# Patient Record
Sex: Female | Born: 1940 | Race: Black or African American | Hispanic: No | Smoking: Former smoker
Health system: Southern US, Community
[De-identification: ages and names within clinical notes are randomized; demographics above are authoritative.]

## PROBLEM LIST (undated history)

## (undated) DIAGNOSIS — Z8042 Family history of malignant neoplasm of prostate: Secondary | ICD-10-CM

## (undated) DIAGNOSIS — Z950 Presence of cardiac pacemaker: Secondary | ICD-10-CM

## (undated) DIAGNOSIS — R0600 Dyspnea, unspecified: Secondary | ICD-10-CM

## (undated) DIAGNOSIS — Z803 Family history of malignant neoplasm of breast: Secondary | ICD-10-CM

## (undated) DIAGNOSIS — I471 Supraventricular tachycardia: Secondary | ICD-10-CM

## (undated) DIAGNOSIS — K219 Gastro-esophageal reflux disease without esophagitis: Secondary | ICD-10-CM

## (undated) DIAGNOSIS — D649 Anemia, unspecified: Secondary | ICD-10-CM

## (undated) DIAGNOSIS — J189 Pneumonia, unspecified organism: Secondary | ICD-10-CM

## (undated) DIAGNOSIS — A1811 Tuberculosis of kidney and ureter: Secondary | ICD-10-CM

## (undated) DIAGNOSIS — I509 Heart failure, unspecified: Secondary | ICD-10-CM

## (undated) DIAGNOSIS — J439 Emphysema, unspecified: Secondary | ICD-10-CM

## (undated) DIAGNOSIS — I10 Essential (primary) hypertension: Secondary | ICD-10-CM

## (undated) DIAGNOSIS — E785 Hyperlipidemia, unspecified: Secondary | ICD-10-CM

## (undated) DIAGNOSIS — M1990 Unspecified osteoarthritis, unspecified site: Secondary | ICD-10-CM

## (undated) HISTORY — DX: Family history of malignant neoplasm of breast: Z80.3

## (undated) HISTORY — DX: Tuberculosis of kidney and ureter: A18.11

## (undated) HISTORY — DX: Hyperlipidemia, unspecified: E78.5

## (undated) HISTORY — DX: Supraventricular tachycardia: I47.1

## (undated) HISTORY — DX: Emphysema, unspecified: J43.9

## (undated) HISTORY — DX: Heart failure, unspecified: I50.9

## (undated) HISTORY — PX: HEMORRHOID SURGERY: SHX153

## (undated) HISTORY — PX: ABDOMINAL HYSTERECTOMY: SHX81

## (undated) HISTORY — PX: TONSILLECTOMY: SUR1361

## (undated) HISTORY — PX: OTHER SURGICAL HISTORY: SHX169

## (undated) HISTORY — DX: Family history of malignant neoplasm of prostate: Z80.42

---

## 1959-04-08 DIAGNOSIS — A1811 Tuberculosis of kidney and ureter: Secondary | ICD-10-CM

## 1959-04-08 HISTORY — DX: Tuberculosis of kidney and ureter: A18.11

## 1999-12-28 ENCOUNTER — Encounter: Admission: RE | Admit: 1999-12-28 | Discharge: 1999-12-28 | Payer: Self-pay | Admitting: Cardiology

## 1999-12-28 ENCOUNTER — Encounter: Payer: Self-pay | Admitting: Cardiology

## 2000-06-28 ENCOUNTER — Encounter: Payer: Self-pay | Admitting: Emergency Medicine

## 2000-06-28 ENCOUNTER — Emergency Department (HOSPITAL_COMMUNITY): Admission: EM | Admit: 2000-06-28 | Discharge: 2000-06-28 | Payer: Self-pay | Admitting: Emergency Medicine

## 2000-06-30 ENCOUNTER — Emergency Department (HOSPITAL_COMMUNITY): Admission: EM | Admit: 2000-06-30 | Discharge: 2000-06-30 | Payer: Self-pay | Admitting: Emergency Medicine

## 2003-12-16 ENCOUNTER — Encounter: Admission: RE | Admit: 2003-12-16 | Discharge: 2003-12-16 | Payer: Self-pay | Admitting: Cardiology

## 2003-12-17 ENCOUNTER — Inpatient Hospital Stay (HOSPITAL_COMMUNITY): Admission: AD | Admit: 2003-12-17 | Discharge: 2003-12-22 | Payer: Self-pay | Admitting: Cardiology

## 2003-12-17 ENCOUNTER — Encounter: Payer: Self-pay | Admitting: Cardiovascular Disease

## 2004-06-10 ENCOUNTER — Emergency Department (HOSPITAL_COMMUNITY): Admission: EM | Admit: 2004-06-10 | Discharge: 2004-06-10 | Payer: Self-pay | Admitting: Emergency Medicine

## 2004-08-15 ENCOUNTER — Ambulatory Visit: Payer: Self-pay | Admitting: Internal Medicine

## 2004-08-16 ENCOUNTER — Ambulatory Visit: Payer: Self-pay | Admitting: Internal Medicine

## 2004-08-19 ENCOUNTER — Ambulatory Visit: Payer: Self-pay | Admitting: Internal Medicine

## 2004-08-23 ENCOUNTER — Ambulatory Visit: Payer: Self-pay | Admitting: Internal Medicine

## 2004-08-30 ENCOUNTER — Ambulatory Visit: Payer: Self-pay | Admitting: Internal Medicine

## 2004-09-06 ENCOUNTER — Ambulatory Visit: Payer: Self-pay | Admitting: Internal Medicine

## 2004-09-09 ENCOUNTER — Ambulatory Visit: Payer: Self-pay | Admitting: Internal Medicine

## 2004-09-13 ENCOUNTER — Ambulatory Visit: Payer: Self-pay | Admitting: Internal Medicine

## 2004-09-20 ENCOUNTER — Ambulatory Visit: Payer: Self-pay | Admitting: Internal Medicine

## 2004-09-23 ENCOUNTER — Ambulatory Visit: Payer: Self-pay | Admitting: Internal Medicine

## 2004-09-30 ENCOUNTER — Ambulatory Visit: Payer: Self-pay | Admitting: Internal Medicine

## 2004-10-04 ENCOUNTER — Ambulatory Visit: Payer: Self-pay | Admitting: Internal Medicine

## 2004-10-13 ENCOUNTER — Ambulatory Visit: Payer: Self-pay | Admitting: Internal Medicine

## 2004-10-17 ENCOUNTER — Ambulatory Visit: Payer: Self-pay | Admitting: Internal Medicine

## 2004-10-24 ENCOUNTER — Ambulatory Visit: Payer: Self-pay | Admitting: Internal Medicine

## 2004-10-31 ENCOUNTER — Ambulatory Visit: Payer: Self-pay | Admitting: Internal Medicine

## 2004-11-08 ENCOUNTER — Ambulatory Visit: Payer: Self-pay | Admitting: Internal Medicine

## 2004-11-14 ENCOUNTER — Ambulatory Visit: Payer: Self-pay | Admitting: Internal Medicine

## 2004-11-22 ENCOUNTER — Ambulatory Visit: Payer: Self-pay | Admitting: Internal Medicine

## 2004-12-02 ENCOUNTER — Ambulatory Visit: Payer: Self-pay | Admitting: Internal Medicine

## 2004-12-30 ENCOUNTER — Ambulatory Visit: Payer: Self-pay | Admitting: Internal Medicine

## 2005-01-10 ENCOUNTER — Ambulatory Visit: Payer: Self-pay | Admitting: Internal Medicine

## 2005-01-20 ENCOUNTER — Ambulatory Visit: Payer: Self-pay | Admitting: Internal Medicine

## 2005-02-20 ENCOUNTER — Ambulatory Visit: Payer: Self-pay | Admitting: Internal Medicine

## 2005-03-24 ENCOUNTER — Ambulatory Visit: Payer: Self-pay | Admitting: Internal Medicine

## 2005-04-28 ENCOUNTER — Ambulatory Visit: Payer: Self-pay | Admitting: Internal Medicine

## 2005-05-26 ENCOUNTER — Ambulatory Visit: Payer: Self-pay | Admitting: Internal Medicine

## 2005-06-02 ENCOUNTER — Ambulatory Visit: Payer: Self-pay | Admitting: Internal Medicine

## 2005-06-06 ENCOUNTER — Ambulatory Visit: Payer: Self-pay | Admitting: Internal Medicine

## 2005-06-07 ENCOUNTER — Ambulatory Visit: Payer: Self-pay | Admitting: Internal Medicine

## 2005-06-20 ENCOUNTER — Ambulatory Visit: Payer: Self-pay | Admitting: Internal Medicine

## 2005-06-27 ENCOUNTER — Ambulatory Visit: Payer: Self-pay | Admitting: Internal Medicine

## 2005-07-04 ENCOUNTER — Ambulatory Visit: Payer: Self-pay | Admitting: Internal Medicine

## 2005-08-04 ENCOUNTER — Ambulatory Visit: Payer: Self-pay | Admitting: Internal Medicine

## 2005-09-28 ENCOUNTER — Ambulatory Visit: Payer: Self-pay | Admitting: Internal Medicine

## 2005-10-04 ENCOUNTER — Ambulatory Visit: Payer: Self-pay | Admitting: Internal Medicine

## 2005-10-16 ENCOUNTER — Ambulatory Visit: Payer: Self-pay | Admitting: Internal Medicine

## 2005-10-23 ENCOUNTER — Ambulatory Visit: Payer: Self-pay | Admitting: Internal Medicine

## 2005-11-06 ENCOUNTER — Ambulatory Visit: Payer: Self-pay | Admitting: Internal Medicine

## 2005-11-28 ENCOUNTER — Ambulatory Visit: Payer: Self-pay | Admitting: Internal Medicine

## 2005-12-08 ENCOUNTER — Ambulatory Visit: Payer: Self-pay | Admitting: Internal Medicine

## 2005-12-12 ENCOUNTER — Ambulatory Visit: Payer: Self-pay | Admitting: Internal Medicine

## 2005-12-18 ENCOUNTER — Ambulatory Visit: Payer: Self-pay | Admitting: Internal Medicine

## 2005-12-26 ENCOUNTER — Ambulatory Visit: Payer: Self-pay | Admitting: Internal Medicine

## 2005-12-29 ENCOUNTER — Ambulatory Visit: Payer: Self-pay | Admitting: Internal Medicine

## 2006-01-04 ENCOUNTER — Ambulatory Visit: Payer: Self-pay | Admitting: Internal Medicine

## 2006-01-10 ENCOUNTER — Ambulatory Visit: Payer: Self-pay | Admitting: Internal Medicine

## 2006-01-16 ENCOUNTER — Ambulatory Visit: Payer: Self-pay | Admitting: Internal Medicine

## 2006-01-29 ENCOUNTER — Ambulatory Visit: Payer: Self-pay | Admitting: Internal Medicine

## 2006-01-31 ENCOUNTER — Ambulatory Visit: Payer: Self-pay | Admitting: Internal Medicine

## 2006-02-09 ENCOUNTER — Ambulatory Visit: Payer: Self-pay | Admitting: Internal Medicine

## 2006-02-13 ENCOUNTER — Ambulatory Visit: Payer: Self-pay | Admitting: Internal Medicine

## 2006-02-23 ENCOUNTER — Ambulatory Visit: Payer: Self-pay | Admitting: Internal Medicine

## 2006-03-06 ENCOUNTER — Ambulatory Visit: Payer: Self-pay | Admitting: Internal Medicine

## 2006-03-30 ENCOUNTER — Ambulatory Visit: Payer: Self-pay | Admitting: Internal Medicine

## 2006-04-03 ENCOUNTER — Ambulatory Visit: Payer: Self-pay | Admitting: Internal Medicine

## 2006-04-13 ENCOUNTER — Ambulatory Visit: Payer: Self-pay | Admitting: Internal Medicine

## 2006-04-26 ENCOUNTER — Ambulatory Visit: Payer: Self-pay | Admitting: Internal Medicine

## 2006-05-08 ENCOUNTER — Ambulatory Visit: Payer: Self-pay | Admitting: Internal Medicine

## 2006-05-22 ENCOUNTER — Ambulatory Visit: Payer: Self-pay | Admitting: Internal Medicine

## 2006-05-29 ENCOUNTER — Ambulatory Visit: Payer: Self-pay | Admitting: Internal Medicine

## 2006-06-05 ENCOUNTER — Ambulatory Visit: Payer: Self-pay | Admitting: Internal Medicine

## 2006-06-06 ENCOUNTER — Ambulatory Visit: Payer: Self-pay | Admitting: Internal Medicine

## 2006-06-14 ENCOUNTER — Ambulatory Visit: Payer: Self-pay | Admitting: Internal Medicine

## 2006-06-19 ENCOUNTER — Ambulatory Visit: Payer: Self-pay | Admitting: Internal Medicine

## 2006-07-03 ENCOUNTER — Ambulatory Visit: Payer: Self-pay | Admitting: Internal Medicine

## 2006-07-09 ENCOUNTER — Ambulatory Visit: Payer: Self-pay | Admitting: Internal Medicine

## 2006-07-18 ENCOUNTER — Encounter: Admission: RE | Admit: 2006-07-18 | Discharge: 2006-07-18 | Payer: Self-pay | Admitting: Cardiology

## 2006-07-19 ENCOUNTER — Ambulatory Visit: Payer: Self-pay | Admitting: Internal Medicine

## 2006-08-03 ENCOUNTER — Ambulatory Visit: Payer: Self-pay | Admitting: Internal Medicine

## 2006-08-07 DIAGNOSIS — I471 Supraventricular tachycardia: Secondary | ICD-10-CM

## 2006-08-07 DIAGNOSIS — I4719 Other supraventricular tachycardia: Secondary | ICD-10-CM

## 2006-08-07 HISTORY — DX: Supraventricular tachycardia: I47.1

## 2006-08-07 HISTORY — DX: Other supraventricular tachycardia: I47.19

## 2006-08-14 ENCOUNTER — Ambulatory Visit: Payer: Self-pay | Admitting: Internal Medicine

## 2006-08-21 ENCOUNTER — Ambulatory Visit: Payer: Self-pay | Admitting: Internal Medicine

## 2006-08-30 ENCOUNTER — Ambulatory Visit: Payer: Self-pay | Admitting: Internal Medicine

## 2006-09-04 ENCOUNTER — Ambulatory Visit: Payer: Self-pay | Admitting: Internal Medicine

## 2006-09-13 ENCOUNTER — Ambulatory Visit: Payer: Self-pay | Admitting: Internal Medicine

## 2006-09-18 ENCOUNTER — Ambulatory Visit: Payer: Self-pay | Admitting: Internal Medicine

## 2006-09-25 ENCOUNTER — Ambulatory Visit: Payer: Self-pay | Admitting: Internal Medicine

## 2006-10-04 ENCOUNTER — Ambulatory Visit: Payer: Self-pay | Admitting: Internal Medicine

## 2006-10-09 ENCOUNTER — Ambulatory Visit: Payer: Self-pay | Admitting: Internal Medicine

## 2006-10-16 ENCOUNTER — Ambulatory Visit: Payer: Self-pay | Admitting: Internal Medicine

## 2006-10-23 ENCOUNTER — Ambulatory Visit: Payer: Self-pay | Admitting: Internal Medicine

## 2006-10-25 ENCOUNTER — Ambulatory Visit: Payer: Self-pay | Admitting: Internal Medicine

## 2006-11-16 ENCOUNTER — Ambulatory Visit: Payer: Self-pay | Admitting: Internal Medicine

## 2006-11-30 ENCOUNTER — Ambulatory Visit: Payer: Self-pay | Admitting: Internal Medicine

## 2006-12-03 ENCOUNTER — Ambulatory Visit: Payer: Self-pay | Admitting: Internal Medicine

## 2006-12-07 ENCOUNTER — Ambulatory Visit: Payer: Self-pay | Admitting: Internal Medicine

## 2006-12-19 ENCOUNTER — Ambulatory Visit: Payer: Self-pay | Admitting: Internal Medicine

## 2006-12-27 ENCOUNTER — Observation Stay (HOSPITAL_COMMUNITY): Admission: EM | Admit: 2006-12-27 | Discharge: 2006-12-28 | Payer: Self-pay | Admitting: Emergency Medicine

## 2006-12-27 ENCOUNTER — Ambulatory Visit: Payer: Self-pay | Admitting: Internal Medicine

## 2007-01-09 ENCOUNTER — Ambulatory Visit: Payer: Self-pay | Admitting: Internal Medicine

## 2007-01-18 ENCOUNTER — Ambulatory Visit: Payer: Self-pay | Admitting: Internal Medicine

## 2007-01-25 ENCOUNTER — Ambulatory Visit: Payer: Self-pay | Admitting: Internal Medicine

## 2007-02-01 ENCOUNTER — Ambulatory Visit: Payer: Self-pay | Admitting: Internal Medicine

## 2007-02-14 ENCOUNTER — Ambulatory Visit: Payer: Self-pay | Admitting: Internal Medicine

## 2007-02-21 ENCOUNTER — Ambulatory Visit: Payer: Self-pay | Admitting: Internal Medicine

## 2007-03-05 ENCOUNTER — Ambulatory Visit: Payer: Self-pay | Admitting: Internal Medicine

## 2007-03-22 ENCOUNTER — Ambulatory Visit: Payer: Self-pay | Admitting: Internal Medicine

## 2007-03-29 ENCOUNTER — Ambulatory Visit: Payer: Self-pay | Admitting: Internal Medicine

## 2007-04-22 ENCOUNTER — Ambulatory Visit: Payer: Self-pay | Admitting: Internal Medicine

## 2007-05-03 ENCOUNTER — Ambulatory Visit: Payer: Self-pay | Admitting: Internal Medicine

## 2007-05-06 ENCOUNTER — Ambulatory Visit: Payer: Self-pay | Admitting: Internal Medicine

## 2007-05-24 ENCOUNTER — Ambulatory Visit: Payer: Self-pay | Admitting: Internal Medicine

## 2007-06-01 DIAGNOSIS — I471 Supraventricular tachycardia, unspecified: Secondary | ICD-10-CM | POA: Insufficient documentation

## 2007-06-01 DIAGNOSIS — J309 Allergic rhinitis, unspecified: Secondary | ICD-10-CM

## 2007-06-01 DIAGNOSIS — J4530 Mild persistent asthma, uncomplicated: Secondary | ICD-10-CM | POA: Insufficient documentation

## 2007-06-01 DIAGNOSIS — A1811 Tuberculosis of kidney and ureter: Secondary | ICD-10-CM | POA: Insufficient documentation

## 2007-06-05 ENCOUNTER — Ambulatory Visit: Payer: Self-pay | Admitting: Internal Medicine

## 2007-07-19 ENCOUNTER — Ambulatory Visit: Payer: Self-pay | Admitting: Internal Medicine

## 2007-08-16 ENCOUNTER — Ambulatory Visit: Payer: Self-pay | Admitting: Internal Medicine

## 2007-09-06 ENCOUNTER — Ambulatory Visit: Payer: Self-pay | Admitting: Internal Medicine

## 2007-09-11 ENCOUNTER — Ambulatory Visit: Payer: Self-pay | Admitting: Internal Medicine

## 2007-09-27 ENCOUNTER — Ambulatory Visit: Payer: Self-pay | Admitting: Internal Medicine

## 2007-12-05 ENCOUNTER — Ambulatory Visit: Payer: Self-pay | Admitting: Internal Medicine

## 2007-12-13 ENCOUNTER — Ambulatory Visit: Payer: Self-pay | Admitting: Internal Medicine

## 2007-12-19 ENCOUNTER — Ambulatory Visit: Payer: Self-pay | Admitting: Internal Medicine

## 2008-01-06 ENCOUNTER — Ambulatory Visit: Payer: Self-pay | Admitting: Internal Medicine

## 2008-01-10 ENCOUNTER — Ambulatory Visit: Payer: Self-pay | Admitting: Internal Medicine

## 2008-02-06 ENCOUNTER — Ambulatory Visit: Payer: Self-pay | Admitting: Internal Medicine

## 2008-03-20 ENCOUNTER — Ambulatory Visit: Payer: Self-pay | Admitting: Internal Medicine

## 2008-04-17 ENCOUNTER — Ambulatory Visit: Payer: Self-pay | Admitting: Internal Medicine

## 2008-05-08 ENCOUNTER — Ambulatory Visit: Payer: Self-pay | Admitting: Internal Medicine

## 2008-05-13 ENCOUNTER — Ambulatory Visit: Payer: Self-pay | Admitting: Internal Medicine

## 2008-09-25 ENCOUNTER — Ambulatory Visit: Payer: Self-pay | Admitting: Internal Medicine

## 2008-10-20 ENCOUNTER — Ambulatory Visit: Payer: Self-pay | Admitting: Internal Medicine

## 2008-12-28 ENCOUNTER — Encounter (HOSPITAL_COMMUNITY): Admission: RE | Admit: 2008-12-28 | Discharge: 2009-03-10 | Payer: Self-pay | Admitting: Cardiology

## 2009-01-26 ENCOUNTER — Telehealth (INDEPENDENT_AMBULATORY_CARE_PROVIDER_SITE_OTHER): Payer: Self-pay | Admitting: *Deleted

## 2009-04-16 ENCOUNTER — Telehealth: Payer: Self-pay | Admitting: Internal Medicine

## 2009-06-21 ENCOUNTER — Encounter: Admission: RE | Admit: 2009-06-21 | Discharge: 2009-06-21 | Payer: Self-pay | Admitting: Gynecology

## 2009-09-24 ENCOUNTER — Ambulatory Visit: Payer: Self-pay | Admitting: Internal Medicine

## 2009-12-06 ENCOUNTER — Encounter: Admission: RE | Admit: 2009-12-06 | Discharge: 2009-12-06 | Payer: Self-pay | Admitting: Cardiology

## 2009-12-13 ENCOUNTER — Encounter: Admission: RE | Admit: 2009-12-13 | Discharge: 2009-12-13 | Payer: Self-pay | Admitting: Cardiology

## 2009-12-23 ENCOUNTER — Ambulatory Visit: Payer: Self-pay | Admitting: Internal Medicine

## 2010-02-10 ENCOUNTER — Encounter: Admission: RE | Admit: 2010-02-10 | Discharge: 2010-02-10 | Payer: Self-pay | Admitting: General Surgery

## 2010-02-17 ENCOUNTER — Ambulatory Visit (HOSPITAL_COMMUNITY): Admission: RE | Admit: 2010-02-17 | Discharge: 2010-02-17 | Payer: Self-pay | Admitting: General Surgery

## 2010-04-01 ENCOUNTER — Ambulatory Visit (HOSPITAL_COMMUNITY): Admission: RE | Admit: 2010-04-01 | Discharge: 2010-04-01 | Payer: Self-pay | Admitting: Urology

## 2010-04-08 ENCOUNTER — Encounter: Admission: RE | Admit: 2010-04-08 | Discharge: 2010-04-08 | Payer: Self-pay | Admitting: Urology

## 2010-04-27 ENCOUNTER — Ambulatory Visit (HOSPITAL_COMMUNITY): Admission: RE | Admit: 2010-04-27 | Discharge: 2010-04-27 | Payer: Self-pay | Admitting: Urology

## 2010-07-12 ENCOUNTER — Encounter: Admission: RE | Admit: 2010-07-12 | Discharge: 2010-07-12 | Payer: Self-pay | Admitting: Urology

## 2010-07-12 ENCOUNTER — Encounter: Payer: Self-pay | Admitting: Internal Medicine

## 2010-07-28 ENCOUNTER — Ambulatory Visit: Payer: Self-pay | Admitting: Internal Medicine

## 2010-08-03 ENCOUNTER — Encounter: Payer: Self-pay | Admitting: Internal Medicine

## 2010-08-11 ENCOUNTER — Ambulatory Visit (HOSPITAL_COMMUNITY)
Admission: RE | Admit: 2010-08-11 | Discharge: 2010-08-11 | Payer: Self-pay | Source: Home / Self Care | Attending: Cardiology | Admitting: Cardiology

## 2010-08-11 ENCOUNTER — Encounter
Admission: RE | Admit: 2010-08-11 | Discharge: 2010-08-11 | Payer: Self-pay | Source: Home / Self Care | Attending: Urology | Admitting: Urology

## 2010-08-15 ENCOUNTER — Ambulatory Visit (HOSPITAL_COMMUNITY)
Admission: RE | Admit: 2010-08-15 | Discharge: 2010-08-15 | Payer: Self-pay | Source: Home / Self Care | Attending: Cardiology | Admitting: Cardiology

## 2010-09-06 NOTE — Miscellaneous (Signed)
Summary: Injection Record/[facility]  Injection Record/ Allergy   Imported By: Sherian Rein 12/08/2009 15:04:07  _____________________________________________________________________  External Attachment:    Type:   Image     Comment:   External Document

## 2010-09-06 NOTE — Assessment & Plan Note (Signed)
Summary: 3 months/ mbw   Primary Provider/Referring Provider:  Donia Guiles  CC:  3 month follow up visit-allergies.  History of Present Illness:  History of Present Illness:  05/13/08- 70 yo woman for 1 yr f/u of rhinitis and asthma. Continues own allergy vaccine without problems.Dr. Shana Chute manages atrial tach. Good year. Some wheeze, little cough, some post nasal drip. 12/07- PFT FEV1 1.53/ 65%, FEV1. 1/FVC- .77  September 24, 2009- Allergic rhintis, asthma Last allergy vaccine was in 2008.  Got depressed after deaths in family. Dr Shana Chute changed her neb solution to [place] twice daily if needed.Marland Kitchen Heart rate has been good without much palpitation. Uses rescue xopenexHFA occasionally up to once daily. She dropped off Advair -cost. Does occasionally have a lot of wheeze in bed. Nose - occasional sneeze.  Dec 23, 2009- Allergic rhinitis, asthma Stopped allergy shots. Doing well through this Spring. Denies recent nasal congestion or sneeze and hasn't needed antihistamines. Says she was told she had inflammation from pneumonia in left upper lung sometime between Feb and April, based on CXR/ CT at [place] last week.  Very occasional cough with tussive right flank pain for which she is seeing urologist tomorrow. Last used Xopenex yesterday- averaging once per day.    Current Medications (verified): 1)  Xopenex Hfa 45 Mcg/act  Aero (Levalbuterol Tartrate) .... Prn 2)  Singulair 10 Mg Tabs (Montelukast Sodium) .Marland Kitchen.. 1 Daily 3)  Brovana 15 Mcg/10ml Nebu (Arformoterol Tartrate) .... Two Times A Day 4)  Ipratropium Bromide 0.02 % Soln (Ipratropium Bromide) .... Four Times A Day 5)  Azor 5-20 Mg Tabs (Amlodipine-Olmesartan) .... Take 1 By Mouth Once Daily 6)  Qvar 80 Mcg/act Aers (Beclomethasone Dipropionate) .... 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  The patient denies anorexia, fever, weight loss, weight gain, vision loss, decreased hearing, hoarseness, syncope, dyspnea on exertion, peripheral edema, prolonged cough, headaches, hemoptysis, abdominal pain, and severe indigestion/heartburn.    Vital Signs:  Patient profile:   70 year old female Height:      67 inches Weight:      289 pounds BMI:     45.43 O2 Sat:      97 % on Room air Pulse rate:   82 / minute BP sitting:   146 / 70  (left arm) Cuff size:   large  Vitals Entered By: Reynaldo Minium CMA (Dec 23, 2009 3:57 PM)  O2 Flow:  Room air  Physical Exam  Additional Exam:  General: A/Ox3; pleasant and cooperative, NAD, obese SKIN: no rash, lesions NODES: no lymphadenopathy HEENT: [place]/AT, EOM- WNL, Conjuctivae- clear, PERRLA, TM-WNL, Nose- clear, Throat- clear and wnl, Mallampati  II, dentures NECK: Supple w/ fair ROM, JVD- none, normal carotid impulses w/o bruits Thyroid- CHEST: Clear to P&A  HEART: RRR, no m/g/r heard ABDOMEN: Soft and nl; ZOX:WRUE, nl pulses, no edema  NEURO: Grossly intact to observation      Impression & Recommendations:  Problem # 1:  ASTHMA (ICD-493.90) Control is good with current meds.  Recent pneumonia by her hx. we will try to get that imaging for our files. We are refiling rescue and maintenance inhalers.  Problem # 2:  ALLERGIC RHINITIS  (ICD-477.9)  Good control for now.  Medications Added to Medication List This Visit: 1)  Azor 5-20 Mg Tabs (Amlodipine-olmesartan) .... Take 1 by mouth once daily  Other Orders: Est. Patient Level III (45409) Prescription Created Electronically [phone])  Patient Instructions: 1)  Please schedule a follow-up appointment in 6 months. 2)  Refil scripts sent to your drug store Prescriptions: QVAR 80 MCG/ACT AERS (BECLOMETHASONE DIPROPIONATE) 2 puffs and rinse mouth once daily  #1 x prn   Entered and Authorized by:   Waymon Budge MD   Signed by:   Waymon Budge MD on 12/23/2009   Method used:   Electronically to        [facility] (retail)       [address]       [place], [place]  [address]       Ph: [phone]       Fax: [phone]   RxID:   [id number] HFA 45 MCG/ACT  AERO (LEVALBUTEROL TARTRATE) PRN  #1 x prn   Entered and Authorized by:   Waymon Budge MD   Signed by:   Waymon Budge MD on 12/23/2009   Method used:   Electronically to        [facility] (retail)       [address]       [place], [place]  [address]       Ph: [phone]       Fax: [phone]   RxID:   [id number]

## 2010-09-06 NOTE — Assessment & Plan Note (Signed)
Summary: [name]/ MBW   Primary Provider/Referring Provider:  Donia Guiles  CC:  Follow up visit-alleriges; needs Xopenex HFA refill.Marland Kitchen  History of Present Illness:  05/13/08- 70 yo woman for 1 yr f/u of rhinitis and asthma. Continues own allergy vaccine without problems.Dr. Shana Chute manages atrial tach. Good year. Some wheeze, little cough, some post nasal drip. 12/07- PFT FEV1 1.53/ 65%, FEV1. 1/FVC- .77  September 24, 2009- Allergic rhintis, asthma Last allergy vaccine was in 2008.  Got depressed after deaths in family. Dr Shana Chute changed her neb solution to [place] twice daily if needed.Marland Kitchen Heart rate has been good without much palpitation. Uses rescue xopenexHFA occasionally up to once daily. She dropped off Advair -cost. Does occasionally have a lot of wheeze in bed. Nose - occasional sneeze.    Current Medications (verified): 1)  Xopenex Hfa 45 Mcg/act  Aero (Levalbuterol Tartrate) .... Prn 2)  Allergy Vaccine .... Per Schedule 3)  Singulair 10 Mg Tabs (Montelukast Sodium) .Marland Kitchen.. 1 Daily 4)  Brovana 15 Mcg/56ml Nebu (Arformoterol Tartrate) .... Two Times A Day 5)  Ipratropium Bromide 0.02 % Soln (Ipratropium Bromide) .... Four Times A Day 6)  Tribenzor 40-5-25 Mg Tabs (Olmesartan-Amlodipine-Hctz) .... 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    Vital Signs:  Patient profile:   70 year old female Height:      67 inches Weight:      293 pounds BMI:     46.06 O2 Sat:      97 % on Room air Pulse rate:   77 / minute BP sitting:   118 / 84  (left arm) Cuff size:   large  Vitals Entered By: Reynaldo Minium CMA (September 24, 2009 3:50 PM)  O2 Flow:  Room air  Physical Exam  Additional Exam:  General: A/Ox3; pleasant and cooperative, NAD, obese SKIN: no rash, lesions NODES: no lymphadenopathy HEENT: [place]/AT, EOM- WNL, Conjuctivae- clear, PERRLA, TM-WNL, Nose- clear, Throat- clear and wnl NECK: Supple w/ fair ROM, JVD- none, normal carotid impulses w/o bruits Thyroid- CHEST: Clear to P&A HEART: RRR, no m/g/r heard ABDOMEN: Soft and nl; GNF:AOZH, nl pulses, no edema  NEURO: Grossly intact to observation      Impression & Recommendations:  Problem # 1:  ASTHMA (ICD-493.90) We need to steer her toward a maintenance controller based regimen. She asked about allergy vaccine. I suggested we wait and see how she does with other measures first.  Problem # 2:  ALLERGIC RHINITIS (ICD-477.9)  Otc antihistamine should be sufficinet for now.  Medications Added  to Medication List This Visit: 1)  Brovana 15 Mcg/65ml Nebu (Arformoterol tartrate) .... Two times a day 2)  Ipratropium Bromide 0.02 % Soln (Ipratropium bromide) .... Four times a day 3)  Tribenzor 40-5-25 Mg Tabs (Olmesartan-amlodipine-hctz) .... Take 1 by mouth once daily 4)  Qvar 80 Mcg/act Aers (Beclomethasone dipropionate) .... 2 puffs and rinse mouth once daily  Other  Orders: Est. Patient Level III (42595)  Patient Instructions: 1)  Please schedule a follow-up appointment in 3 months. 2)  Sample/ script Qvar 80, 2 puffs and rinse mouth once daily 3)  Use your Xopenex rescue inhaler up to four times a day as needed Or use your nebulizer Brovana up to twice daily if needed. 4)  Flu vax Prescriptions: QVAR 80 MCG/ACT AERS (BECLOMETHASONE DIPROPIONATE) 2 puffs and rinse mouth once daily  #1 x prn   Entered and Authorized by:   Waymon Budge MD   Signed by:   Waymon Budge MD on 09/24/2009   Method used:   Print then Give to Patient   RxID:   [id number]

## 2010-09-07 ENCOUNTER — Encounter: Payer: Self-pay | Admitting: Interventional Radiology

## 2010-09-08 NOTE — Assessment & Plan Note (Signed)
Summary: rov 6 month///kp   Primary Provider/Referring Provider:  Donia Guiles  CC:  6 month f/u appt.  states breathing is unchanged from last visit.  c/o wheezing at night.  History of Present Illness: September 24, 2009- Allergic rhintis, asthma Last allergy vaccine was in 2008.  Got depressed after deaths in family. Dr Shana Chute changed her neb solution to [place] twice daily if needed.[name] Heart rate has been good without much palpitation. Uses rescue xopenexHFA occasionally up to once daily. She dropped off Advair -cost. Does occasionally have a lot of wheeze in bed. Nose - occasional sneeze.  Dec 23, 2009- Allergic rhinitis, asthma Stopped allergy shots. Doing well through this Spring. Denies recent nasal congestion or sneeze and hasn't needed antihistamines. Says she was told she had inflammation from pneumonia in left upper lung sometime between Feb and April, based on CXR/ CT at [place] last week.  Very occasional cough with tussive right flank pain for which she is seeing urologist tomorrow. Last used Xopenex yesterday- averaging once per [name].  July 28, 2010-  Allergic rhinitis, asthma Nurse-CC: 6 month f/u appt.  states breathing is unchanged from last visit.  c/o wheezing at night Doing well with no acute events. Gets short of breath walking long hall. She admits she is just too heavy and out of shape.   Needs flu vax. Asked about Pneumovax- has had at least 2 which is enough.  She remains comfortable off allergy vaccine.     Asthma History    Initial Asthma Severity Rating:    Age range: 12+ years    Symptoms: 0-2 days/week    Nighttime Awakenings: 0-2/month    Interferes w/ normal activity: no limitations    SABA use (not for EIB): 0-2 days/week    Asthma Severity Assessment: Intermittent   Preventive Screening-Counseling & Management  Alcohol-Tobacco     Smoking Status: quit     Packs/Hisako Bugh: 0.5     Year Started: age 52     Year Quit: 1979  Medications  Prior to Update: 1)  Xopenex Hfa 45 Mcg/act  Aero (Levalbuterol Tartrate) .... Prn 2)  Singulair 10 Mg Tabs (Montelukast Sodium) .[name].. 1 Daily 3)  Brovana 15 Mcg/22ml Nebu (Arformoterol Tartrate) .... Two Times A Lyndsie Wallman 4)  Ipratropium Bromide 0.02 % Soln (Ipratropium Bromide) .... Four Times A Kiah Vanalstine 5)  Azor 5-20 Mg Tabs (Amlodipine-Olmesartan) .... Take 1 By Mouth Once Daily 6)  Qvar 80 Mcg/act Aers (Beclomethasone Dipropionate) .... 2 Puffs and Rinse Mouth Once Daily  Allergies (verified): 1)  ! 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  The patient denies shortness of breath at rest, productive cough, non-productive cough, coughing up blood, chest pain, irregular heartbeats, acid heartburn, indigestion, loss of appetite, weight change, abdominal pain,  difficulty swallowing, sore throat, tooth/dental problems, headaches, nasal congestion/difficulty breathing through nose, and sneezing.    Vital Signs:  Patient profile:   70 year old female Height:      67 inches Weight:      289.25 pounds BMI:     45.47 O2 Sat:      99 % on Room air Pulse rate:   87 / minute BP sitting:   154 / 82  (left arm) Cuff size:   large  Vitals Entered By: Arman Filter LPN (July 28, 2010 4:15 PM)  O2 Flow:  Room air CC: 6 month f/u appt.  states breathing is unchanged from last visit.  c/o wheezing at night Comments Medications reviewed with patient Arman Filter LPN  July 28, 2010 4:28 PM    Physical Exam  Additional Exam:  General: A/Ox3; pleasant and cooperative, NAD, obese SKIN: no rash, lesions NODES: no lymphadenopathy HEENT: /AT, EOM- WNL, Conjuctivae- clear, PERRLA, TM-WNL, Nose- clear, Throat- clear and wnl, Mallampati  II, dentures NECK: Supple w/ fair ROM, JVD- none, normal carotid impulses w/o bruits Thyroid- CHEST: Clear to P&A HEART: RRR, no m/g/r heard ABDOMEN: Soft and nl; NGE:XBMW, nl pulses, no edema  NEURO: Grossly intact to observation      Impression & Recommendations:  Problem # 1:  ASTHMA (ICD-493.90) Excellent control. She is pleased. Dyspnea would be substantially helped by weight loss and regular exercise.   Problem # 2:  ALLERGIC RHINITIS (ICD-477.9)  doing well off allergy vaccine.   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   Other Orders: Est. Patient Level III (41324) Flu Vaccine 24yrs + MEDICARE PATIENTS (M0102) Administration Flu vaccine - MCR (V2536)  Patient Instructions: 1)  Please schedule a follow-up appointment in 6 months. 2)  Flu vax 3)  Refill scripts for meds.  Prescriptions: QVAR 80 MCG/ACT AERS (BECLOMETHASONE DIPROPIONATE) 2 puffs and rinse mouth once daily  #1 x prn   Entered and Authorized by:   Waymon Budge MD   Signed by:   Waymon Budge MD on 07/28/2010   Method used:   Print then Give to Patient   RxID:   [id number] IPRATROPIUM BROMIDE 0.02 % SOLN (IPRATROPIUM BROMIDE) four times a Avereigh Spainhower  #25 x prn   Entered and Authorized by:   Waymon Budge MD   Signed by:   Waymon Budge MD on 07/28/2010   Method used:   Print then Give to Patient   RxID:   [id number] 15 MCG/2ML NEBU  (ARFORMOTEROL TARTRATE) two times a Kyliee Ortego  #25 x prn   Entered and Authorized by:   Waymon Budge MD   Signed by:   Waymon Budge MD on 07/28/2010   Method used:   Print then Give to Patient   RxID:   [id number] SINGULAIR 10 MG TABS (MONTELUKAST SODIUM) 1 daily  #30 x prn   Entered and Authorized by:   Waymon Budge MD   Signed by:   Waymon Budge MD on 07/28/2010   Method used:   Print then Give to Patient   RxID:   [id number] HFA 45 MCG/ACT  AERO (LEVALBUTEROL TARTRATE) PRN  #1 x prn   Entered and Authorized by:   Waymon Budge MD   Signed by:   Waymon Budge MD on 07/28/2010   Method used:   Print then Give to Patient   RxID:  [id number]  Flu Vaccine Consent Questions     Do you have a history of severe allergic reactions to this vaccine? no    Any prior history of allergic reactions to egg and/or gelatin? no    Do you have a sensitivity to the preservative Thimersol? no    Do you have a past history of Guillan-Barre Syndrome? no    Do you currently have an acute febrile illness? no    Have you ever had a severe reaction to latex? no    Vaccine information given and explained to patient? yes    Are you currently pregnant? no    Lot Number:[id number]   Exp Date:02/04/2011   Site Given  Left Deltoid IM.[name]  Aundra Millet Reynolds LPN  July 28, 2010 5:22 PM

## 2010-09-08 NOTE — Consult Note (Signed)
Summary: Irish Lack MD/[facility]  Irish Lack MD/[facility]   Imported By: Lester Th[place] 07/25/2010 08:20:34  _____________________________________________________________________  External Attachment:    Type:   Image     Comment:   External Document

## 2010-09-29 ENCOUNTER — Encounter: Payer: Self-pay | Admitting: Internal Medicine

## 2010-09-30 ENCOUNTER — Encounter: Payer: Self-pay | Admitting: Internal Medicine

## 2010-10-20 ENCOUNTER — Encounter: Payer: Self-pay | Admitting: Internal Medicine

## 2010-10-20 ENCOUNTER — Institutional Professional Consult (permissible substitution) (INDEPENDENT_AMBULATORY_CARE_PROVIDER_SITE_OTHER): Payer: Medicare Other | Admitting: Internal Medicine

## 2010-10-20 DIAGNOSIS — I471 Supraventricular tachycardia: Secondary | ICD-10-CM

## 2010-10-21 LAB — CREATININE, SERUM: GFR calc non Af Amer: 57 mL/min — ABNORMAL LOW (ref >60)

## 2010-10-25 NOTE — Consult Note (Signed)
Summary: Cardiology/ Internal Medicine Office Visit  Cardiology/ Internal Medicine Office Visit   Imported By: Marylou Mccoy 10/19/2010 16:26:44  _____________________________________________________________________  External Attachment:    Type:   Image     Comment:   External Document

## 2010-10-25 NOTE — Procedures (Signed)
Summary: Strips  Strips   Imported By: Marylou Mccoy 10/19/2010 16:27:58  _____________________________________________________________________  External Attachment:    Type:   Image     Comment:   External Document

## 2010-10-25 NOTE — Assessment & Plan Note (Signed)
Summary: psvt/jerome spruill md [phone]/[facility] medicare complete/pt 854...   Visit Type:  Initial Consult Primary Provider:  Donia Guiles  CC:  irregular heart beat.  History of Present Illness: [name] is referred today for evaluation of SVT. She has a h/o COPD/asthma and has had tachypalpitations for several yrs. She has had documented SVT at rates of up to 190 beats a minute. She has never had syncope. When her heart races it will last for a few minutes and stop spontaneously. She has worsening dyspnea with these episodes. No other complaints.  Current Medications (verified): 1)  Xopenex Hfa 45 Mcg/act  Aero (Levalbuterol Tartrate) .... Prn 2)  Singulair 10 Mg Tabs (Montelukast Sodium) .... As Needed 3)  Brovana 15 Mcg/58ml Nebu (Arformoterol Tartrate) .... Two Times A Day 4)  Ipratropium Bromide 0.02 % Soln (Ipratropium Bromide) .... As Needed 5)  Qvar 80 Mcg/act Aers (Beclomethasone Dipropionate) .... 2 Puffs and Rinse Mouth Once Daily  Allergies: 1)  ! 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  Vital Signs:  Patient profile:   70 year old female Height:      67 inches Weight:      290.25 pounds BMI:     45.62 Pulse rate:   92 / minute Pulse rhythm:   regular Resp:     20 per minute BP sitting:   126 / 76  (right arm) Cuff size:   large  Vitals Entered By: Vikki Ports (October 20, 2010 4:24 PM)  Physical Exam  General:  Obese,well developed, well nourished, in no acute distress.  HEENT: normal Neck: supple. No JVD. Carotids 2+ bilaterally no bruits Cor: RRR no rubs, gallops or murmur Lungs: CTA Ab: soft, nontender. nondistended. No HSM. Good bowel sounds Ext: warm. no cyanosis, clubbing or edema Neuro: alert and oriented. Grossly nonfocal. affect pleasant    EKG  Procedure date:  10/20/2010  Findings:      Normal sinus rhythm with rate of:  92.  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 It is unclear to me whether her symptoms are due to AVNRT or Atrial tachycardia. I have offered her EPS/RFA of SVT and she is considering her options and will call us if she wishes to proceed. The following medications were removed from the medication list:    Azor 5-20 Mg Tabs (Amlodipine-olmesartan) .Marland Kitchen... Take 1 by mouth once daily

## 2010-10-25 NOTE — Consult Note (Signed)
Summary: Cardiology/ Internal Medicine Office Visit  Cardiology/ Internal Medicine Office Visit   Imported By: Marylou Mccoy 10/19/2010 16:27:01  _____________________________________________________________________  External Attachment:    Type:   Image     Comment:   External Document

## 2010-10-31 ENCOUNTER — Encounter: Payer: Self-pay | Admitting: Internal Medicine

## 2010-11-02 ENCOUNTER — Telehealth: Payer: Self-pay | Admitting: Internal Medicine

## 2010-11-03 NOTE — Telephone Encounter (Signed)
Spoke with patient she will have her ablation on 11/23/10.  Wants Dr Ladona Ridgel to call and talk with her sons prior to procedure if he can  Coming in for labs on 11/13/10

## 2010-11-21 NOTE — Telephone Encounter (Signed)
Pt calling to reschedule ablation.

## 2010-11-21 NOTE — Telephone Encounter (Signed)
Spoke with pt  She is going to reschedule her ablation to 12/26/10 and labs on 12/19/10 at 4pm  Will get instructions at that time

## 2010-11-24 ENCOUNTER — Other Ambulatory Visit: Payer: Medicare Other | Admitting: *Deleted

## 2010-11-29 ENCOUNTER — Encounter: Payer: Self-pay | Admitting: *Deleted

## 2010-12-20 NOTE — Assessment & Plan Note (Signed)
[facility]                             PULMONARY OFFICE NOTE   [name], [name]                          MRN:          [id number]  DATE:12/27/2006                            DOB:          Jan 02, 1941    PROBLEM:  1. Allergic rhinitis.  2. Asthma.  3. History of tuberculosis of kidney.  4. TAT.   HISTORY:  [name] came in today for her allergy shot as usual. On  arrival this late afternoon, she stated that around noon today she had  abrupt onset of dyspnea with palpitations and a little lightheadedness,  but no chest pain. She has had episodes like this before and from her  description it sounds as if she has been in PAT. She knew to try  Valsalva, but it had no effect. She used to take medication to control  her heart rate, but has not had any for quite a long time. She took  neither her albuterol nor her Advair today.   MEDICATIONS:  1. Advair 250/50.  2. Rescue albuterol HFA.  3. Allergy vaccine.   DRUG INTOLERANCES:  No medication allergy.   OBJECTIVE:  Blood pressure 158/78, on repeat 182/102. Pulse rate 160.  She was obese, alert, able to laugh.  Rapid regular pulse without audible murmur.  No neck vein distention.  Breath sounds were shallow, but without rales or wheeze.   EKG: Shows a rapid supraventricular rhythm at 157 beats per minute.  There is diffuse ST depression, which may be rate related.   I was unable to do more than slightly slow the rate with either carotid  massage or having her bear down.   IMPRESSION:  Paroxysmal atrial tachycardia, not related to her  bronchodilator medications since she had not taken them today.   PLAN:  Will put in a call to Dr.  Shana Chute. Pending his return call, we  sent her on to the [facility]. Her grandson is with her and is  driving. Dr.  Shana Chute did return our call and was informed of events.     Clinton D. Maple Hudson, MD, Tonny Bollman, FACP  Electronically Signed    CDY/MedQ  DD:  12/27/2006  DT: 12/27/2006  Job #: [id number]   cc:   Osvaldo Shipper. Spruill, M.D.

## 2010-12-20 NOTE — Discharge Summary (Signed)
[name], [name] NO.:  [id number]   MEDICAL RECORD NO.:  [id number]          PATIENT TYPE:  OBV   LOCATION:  2018                         FACILITY:  [facility]   PHYSICIAN:  Osvaldo Shipper. Spruill, M.D.DATE OF BIRTH:  [birth year]   DATE OF ADMISSION:  12/27/2006  DATE OF DISCHARGE:  12/28/2006                               DISCHARGE SUMMARY   Ms. Steinmeyer is a 70 year old patient with a history of asthma who developed  palpitations and lightheadedness.  On Dec 27, 2006 she was seen by one  of the pulmonologists, who discovered supraventricular tachycardia on  electrocardiogram with a heart rate of 153.  The patient was  subsequently admitted for additional evaluation of this particular  problem.  The initial history and physical evaluation was done by Dr.  Donia Guiles.  At the time of admission the heart rate had slowed down  to 73.  There was a normal S1/S2.  No S3 present, no rubs present.  Blood pressure was elevated at 151/93.  The patient was admitted to  observation status.  The patient was started on beta-blocker therapy.  The patient was seen by pharmacy for Lovenox protocol for chest pain and  palpitations.  The patient was treated with Coreg and with this the  patient no longer had any palpitations.  On May 23 the heart rate was  68.  The patient was feeling much better.  There were no problems or  complications.  The patient was discharged with Coreg added to her  current medications.   DISCHARGE MEDICATIONS:  1. Coreg 25 mg b.i.d.  2. Advair 250/50 one puff daily.  3. Xopenex inhaler two puffs q.i.d.  4. Aspirin 325 mg daily.   The patient is to be seen in the office in one week, sooner if any  changes, problems or concerns.      Ivery Quale, P.A.      Osvaldo Shipper. Spruill, M.D.  Electronically Signed    HB/MEDQ  D:  02/06/2007  T:  02/06/2007  Job:  [id number]

## 2010-12-22 ENCOUNTER — Telehealth: Payer: Self-pay | Admitting: Internal Medicine

## 2010-12-22 NOTE — Telephone Encounter (Signed)
lmom for pt to call back and we can not reschedule procedure for that day

## 2010-12-22 NOTE — Telephone Encounter (Signed)
Pt has cath scheduled on 5-21 and would like this re scheduled for 5-28 because her son is coming home.  He has already paid for flight. Please call pt and advise if this can be changed.

## 2010-12-23 ENCOUNTER — Encounter: Payer: Self-pay | Admitting: Internal Medicine

## 2010-12-23 NOTE — Telephone Encounter (Signed)
This encounter was created in error - please disregard.

## 2010-12-23 NOTE — Assessment & Plan Note (Signed)
[facility]                               PULMONARY OFFICE NOTE   [name], [name]                          MRN:          [id number]  DATE:06/05/2006                            DOB:          June 13, 1941    PULMONARY/ALLERGY FOLLOW-UP:   PROBLEMS:  1. Allergic rhinitis.  2. Asthma.  3. History of tuberculosis of kidney.   HISTORY:  She has reached vaccine strength 1:1, getting her injections here  with no problems.  She has had a panic attack twice now with markedly  increased dyspnea, once when she was walking down a warm, close corridor and  another time she was in an auditorium situation, very high up.  In both  cases she got anxious, feeling closed-in and smothered but without pain or  palpitation, and the problems were self-limited.   MEDICATIONS:  1. Advair 250/50 mcg.  2. Allergy vaccine.  3. Albuterol rescue inhaler.   No medication allergies.   OBJECTIVE:  VITAL SIGNS:  She is substantially overweight at 293 pounds, BP  144/88, pulse regular at 84, room air saturation 96%.  CHEST:  Lung fields are clear.  Work of breathing now is not increased.  Diaphragm excursion is shallow consistent with body build.  CARDIAC:  Heart sounds are barely audible but regular.  NECK:  There is no neck vein distention or stridor.  HEENT:  Mild watery nose.  EXTREMITIES:  No peripheral edema.   IMPRESSION:  1. Dyspneic episodes consistent with panic and claustrophobia.  2. Asthma, well-controlled.  3. Allergic rhinitis.   PLAN:  1. She will continue allergy vaccine at 1:10.  2. When available, she wants pneumococcal vaccine and has had flu vaccine      this year.  We are out today.  3. Schedule chest x-ray and pulmonary function tests.  4. Refill Advair.  5. Schedule return her 6 months or earlier p.r.n. to see how she is doing      in the spring.     Clinton D. Maple Hudson, MD, Tonny Bollman, FACP  Electronically Signed    CDY/MedQ  DD: 06/05/2006   DT: 06/06/2006  Job #: [id number]   cc:   Osvaldo Shipper. Spruill, M.D.

## 2010-12-23 NOTE — Assessment & Plan Note (Signed)
[name]                             PULMONARY OFFICE NOTE   [name]                          MRN:          [id number]  DATE:12/03/2006                            DOB:          11/09/1940    PROBLEMS:  1. Allergic rhinitis.  2. Asthma.  3. History of tuberculosis of kidney.   HISTORY:  She feels that she is doing very well on current therapy with  no interest in changing. She continues vaccine here at 1-10. Pollen has  not been bad so far this spring. Little cough or wheeze.   MEDICATIONS:  1. Advair 250/50.  2. Allergy vaccine.  3. Albuterol HFA as a rescue inhaler.   ALLERGIES:  No medication allergy.   OBJECTIVE:  Weight 289 pounds, blood pressure 122/78, pulse 76, room air  saturation 94%. Lungs are clear. Heart sounds are regular without  murmur. Nasal airway seems unobstructed. She is significantly  overweight.   CHEST X-RAY:  Her film of 06/05/2006 showed no acute disease. There was  diffuse spurring in the thoracic spine. A calcified lymph node was noted  in the superior mediastinum on the right and some scarring in the left  lung base. All unchanged from September 2005.   PULMONARY FUNCTION TEST:  Pulmonary function test showed mild to  moderate restriction of total lung capacity at 67% of predicted and  probable mild obstructive airways disease with an FEV1 of 1.53 (65% of  predicted) ratio 0.77. Mild response to bronchodilator. Diffusion was  severely reduced at 40% of predicted correcting for lung volume.   IMPRESSION:  1. Allergic rhinitis.  2. Asthma/chronic obstructive pulmonary disease.  3. Restrictive component due to obesity with hypoventilation.   PLAN:  No changes except that I have encouraged her to walk more, build  endurance, and lose weight. Schedule return in 6 months, earlier p.r.n.     Lisa D. Maple Hudson, MD, [name], FACP  Electronically Signed    CDY/MedQ  DD: 12/07/2006  DT: 12/08/2006  Job #:  [id number]   cc:   Osvaldo Shipper. Spruill, M.D.

## 2010-12-23 NOTE — Telephone Encounter (Signed)
Returning your call from yesterday regarding her test on Monday.

## 2010-12-23 NOTE — Discharge Summary (Signed)
NAME:  [name], [name]                               ACCOUNT NO.:  [id number]   MEDICAL RECORD NO.:  [id number]                   PATIENT TYPE:  INP   LOCATION:  3742                                 FACILITY:  [facility]   PHYSICIAN:  Osvaldo Shipper. Spruill, M.D.             DATE OF BIRTH:  [birth year]   DATE OF ADMISSION:  12/17/2003  DATE OF DISCHARGE:  12/22/2003                                 DISCHARGE SUMMARY   DISCHARGE DIAGNOSES:  1. Cardiac dysrhythmia.  2. Osteoarthritis.  3. Obesity.   HISTORY OF PRESENT ILLNESS:  [name] is a 70 year old patient who was  admitted from the office due to paroxysmal supraventricular tachycardia.  The patient has a history of palpitations.  This has become progressively  worse.  Her heart rate was 156 beats per minute.  The electrocardiogram  revealed supraventricular tachycardia.  The patient is admitted for  aggressive treatment of the same.   HOSPITAL COURSE:  The patient was admitted to the medical service, was  placed on telemetry, was started on IV fluids and was seen by pharmacy for  heparin protocol.  She was scheduled for a Persantine Cardiolite study which  revealed an ejection fraction of 64%.  There was no evidence of myocardial  ischemia or infarction.  The patient was treated with Toprol XL, 50 mg  b.i.d. and digoxin 0.25 mg each morning.  Also, Cardizem 10 mg IV, and a  Cardizem drip at 5 cc per hour to be titrated as needed.  On May 14, the  patient was beginning to feel better with some improvement of the  supraventricular tachycardia.  The digoxin was placed on hold as the patient  began to have some episodes of cardio-brady arrhythmia.  The patient  developed some dyspnea and wheezing.  Given the history of supraventricular  tachycardia, a CT scan of the chest was obtained to rule out pulmonary  embolus.  There was no evidence of pulmonary embolism.  There was left lower-  lobe atelectasis with some associated bronchitic changes being  present.   The patient also had a transthoracic echocardiogram which revealed the  overall left ventricular systolic function to be normal.  The left  ventricular ejection fraction was estimated to be between 55 and 65%.  There  was left ventricular wall thickness that was mild to moderately increased.  There was no pericardial effusion.   The patient continued to show improvement, and on Dec 22, 2003, it was the  opinion that the patient had received maximum benefit from this  hospitalization and could be discharged home.   MEDICATIONS AT DISCHARGE:  1. Prednisone 10 mg daily.  2. Toprol XL 25 mg daily.  3. Lipitor 10 mg daily.  4. Protonix 40 mg daily.   DISCHARGE DIET:  The patient is to be on a low cholesterol diet.   FOLLOWUP:  She is to be seen in  the office in one week, sooner for any  changes, problems or concerns.      [name], P.A.                       Osvaldo Shipper. Spruill, M.D.    [name]  D:  01/27/2004  T:  01/30/2004  Job:  [id number]

## 2010-12-26 ENCOUNTER — Ambulatory Visit (HOSPITAL_COMMUNITY): Admission: RE | Admit: 2010-12-26 | Payer: Medicare PPO | Source: Ambulatory Visit | Admitting: Internal Medicine

## 2010-12-26 NOTE — Telephone Encounter (Signed)
Procedure cx and dates given to reschedule through June  She is going to discuss with her son and let me know

## 2011-01-10 ENCOUNTER — Telehealth: Payer: Self-pay | Admitting: Internal Medicine

## 2011-01-10 NOTE — Telephone Encounter (Signed)
Pt called and wants to set up ablation.  She would like this on June 18.  Please call her before setting the appt.  She is aware that Tresa Endo is off today and she is willing to wait until tomorrow.

## 2011-01-11 NOTE — Telephone Encounter (Signed)
Will call pt tomorrow and set up

## 2011-01-19 ENCOUNTER — Encounter: Payer: Self-pay | Admitting: *Deleted

## 2011-01-24 ENCOUNTER — Ambulatory Visit: Payer: Self-pay | Admitting: Internal Medicine

## 2011-01-30 ENCOUNTER — Other Ambulatory Visit (INDEPENDENT_AMBULATORY_CARE_PROVIDER_SITE_OTHER): Payer: Medicare PPO | Admitting: *Deleted

## 2011-01-30 DIAGNOSIS — I498 Other specified cardiac arrhythmias: Secondary | ICD-10-CM

## 2011-01-31 LAB — CBC WITH DIFFERENTIAL/PLATELET
Basophils Absolute: 0 10*3/uL (ref 0.0–0.1)
Eosinophils Relative: 1.2 % (ref 0.0–5.0)
HCT: 42.2 % (ref 36.0–46.0)
Hemoglobin: 14.1 g/dL (ref 12.0–15.0)
Lymphocytes Relative: 34.2 % (ref 12.0–46.0)
Lymphs Abs: 2.8 10*3/uL (ref 0.7–4.0)
Monocytes Relative: 7.4 % (ref 3.0–12.0)
Neutro Abs: 4.6 10*3/uL (ref 1.4–7.7)
RDW: 13 % (ref 11.5–14.6)
WBC: 8.2 10*3/uL (ref 4.5–10.5)

## 2011-01-31 LAB — PROTIME-INR
INR: 1.2 ratio — ABNORMAL HIGH (ref 0.8–1.0)
Prothrombin Time: 13.2 s — ABNORMAL HIGH (ref 10.2–12.4)

## 2011-01-31 LAB — BASIC METABOLIC PANEL
Calcium: 8.8 mg/dL (ref 8.4–10.5)
GFR: 64.1 mL/min (ref >60.00)
Glucose, Bld: 103 mg/dL — ABNORMAL HIGH (ref 70–99)
Potassium: 3.7 mEq/L (ref 3.5–5.1)
Sodium: 142 mEq/L (ref 135–145)

## 2011-02-06 ENCOUNTER — Observation Stay (HOSPITAL_COMMUNITY)
Admission: RE | Admit: 2011-02-06 | Discharge: 2011-02-07 | Disposition: A | Discharge status: Home / Self Care | Payer: Medicare PPO | Source: Ambulatory Visit | Attending: Internal Medicine | Admitting: Internal Medicine

## 2011-02-06 DIAGNOSIS — I471 Supraventricular tachycardia: Secondary | ICD-10-CM

## 2011-02-06 DIAGNOSIS — I498 Other specified cardiac arrhythmias: Principal | ICD-10-CM | POA: Insufficient documentation

## 2011-02-06 DIAGNOSIS — I442 Atrioventricular block, complete: Secondary | ICD-10-CM

## 2011-02-06 DIAGNOSIS — J309 Allergic rhinitis, unspecified: Secondary | ICD-10-CM | POA: Insufficient documentation

## 2011-02-06 DIAGNOSIS — J45909 Unspecified asthma, uncomplicated: Secondary | ICD-10-CM | POA: Insufficient documentation

## 2011-02-06 HISTORY — PX: PACEMAKER INSERTION: SHX728

## 2011-02-07 ENCOUNTER — Encounter: Payer: Self-pay | Admitting: *Deleted

## 2011-02-07 ENCOUNTER — Observation Stay (HOSPITAL_COMMUNITY): Payer: Medicare PPO

## 2011-02-15 ENCOUNTER — Telehealth: Payer: Self-pay | Admitting: Internal Medicine

## 2011-02-20 ENCOUNTER — Ambulatory Visit (INDEPENDENT_AMBULATORY_CARE_PROVIDER_SITE_OTHER): Payer: Medicare PPO | Admitting: *Deleted

## 2011-02-20 DIAGNOSIS — I471 Supraventricular tachycardia: Secondary | ICD-10-CM

## 2011-02-20 DIAGNOSIS — I495 Sick sinus syndrome: Secondary | ICD-10-CM

## 2011-02-20 NOTE — Progress Notes (Signed)
Pacer check in clinic  

## 2011-02-23 NOTE — Op Note (Signed)
[name], [name] NO.:  [id number]  MEDICAL RECORD NO.:  [id number]  LOCATION:  6523                         FACILITY:  [facility]  PHYSICIAN:  Doylene Canning. 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  INTRODUCTION:  The patient is a 70 year old woman with recurrent tachypalpitations and documented SVT for many years.  Her symptoms occur almost daily.  She has been refractory to medical therapy with beta- blockers.  The patient has had severe symptoms and missed her husband's funeral several years ago secondary to her heart racing associated with chest pain.  She is now referred for catheter ablation.  PROCEDURE:  After informed consent was obtained, the patient was taken to diagnostic EP lab in the fasting state.  After usual preparation and draping, intravenous fentanyl and midazolam was given for sedation.  A 6- [place] hexapolar catheter was inserted percutaneously into the right jugular vein and advanced to the coronary sinus.  A 6-French quadripolar catheter was inserted percutaneously into the right femoral vein and advanced to the right ventricle.  A 6-French quadripolar catheter was inserted percutaneously into the right femoral vein and advanced to His bundle region.  After measurement of the basic intervals, rapid ventricular pacing was carried out from the right ventricle demonstrating VA Wenckebach at cycle length of 340 milliseconds.  During rapid ventricular pacing, the atrial activation sequence was midline and decremental.  There was no inducible arrhythmias.  Next, programmed ventricular stimulation was carried out from the right ventricle at a basic drive cycle length of 960 milliseconds.  The S1-S2 interval was stepwise decreased down to 280  milliseconds where the ventricular refractoriness was observed.  During programmed ventricular stimulation, the atrial activation sequence was again midline and decremental. During programmed ventricular stimulation, there were no inducible arrhythmias.  Next, programmed atrial stimulation was carried out from the coronary sinus at basic drive cycle length of 454 milliseconds.  The S1-S2 interval stepwise decreased down to 280 milliseconds where the AV node ERP was observed.  During programmed atrial stimulation, there were multiple AH jumps and echo beats.  There was initially no inducible SVT. Next, rapid atrial pacing was carried out from the coronary sinus and the high right atrium at pacing cycle length of 600 milliseconds and stepwise decreased down to 310 milliseconds where AV Wenckebach was observed.  During rapid atrial pacing, the PR interval was equal to the RR interval and there was initially no inducible SVT.  Isoproterenol was then infused at a rate of one mcg per minute.  This resulted with rapid atrial pacing and programmed with atrial stimulation and easily inducible SVT.  Cycle length was around 340-350 milliseconds.  During SVT, the VA interval was basically 0 and PVCs placed at the time of His bundle refractoriness did not pre-excite the atrium.  During SVT, ventricular pacing resulted in a VAV conduction sequence.  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  The  ablation catheter was then maneuvered into Koch's triangle and mapping was subsequent carried out.  Mapping of Koch's triangle demonstrated an unusually small Koch's triangle which was very short in size. Additional mapping demonstrated no atrial signal in Koch's triangle until you were at site 3 in Koch's triangle.  In this location, RF energy application was carried out between the tricuspid valve annulus all the way back to the eustachian ridge.  During RF energy  application, there was accelerated junctional rhythm.  At times, when the ablation catheter was even closer to the AV node region, there would be retrograde block and RF was immediately extinguished.  During the 16th RF energy application, prolonged accelerated junctional rhythm was demonstrated until [place] block occurred and the ablation catheter was moved and the RF was terminated.  Prior to this, SVT remained easily inducible.  After the 16th RF energy application, SVT was much more difficult to induce, but after approximately 7 minutes, there was recurrently inducible AVNRT.  At this point, it became clear that the likelihood of development of the complete heart block with continued ablation was quite high.  After significant consideration, because the patient's symptoms were so frequent and because of her advanced age, I deemed it most appropriate to proceed with catheter ablation, still hoping not to end up with complete heart block.  On the 17th RF energy application, there was accelerated junctional rhythm and this was followed by 2 blocked beats going retrograde and then complete heart block was demonstrated.  The patient was observed for 45 minutes and she had a very regular narrow ventricular escape at a rate of approximately 35 beats per minute.  Unfortunately, she did not have recurrent AV conduction and the patient was prepped for pacemaker insertion which will be dictated on a new procedure note.  COMPLICATIONS:  The patient's procedure was complicated by the development of a complete heart block.  RESULTS:  A.  Baseline ECG.  Baseline ECG demonstrates sinus rhythm with normal axis intervals. B.  Baseline intervals.  Sinus node cycle length was 753 milliseconds, the QRS duration was 81 milliseconds.  The QT interval was 400 milliseconds.  The AH interval was 114 milliseconds.  The HV interval was 30 milliseconds. C.  Rapid ventricular pacing.  Rapid ventricular pacing was  carried out from the right ventricle demonstrating VA Wenckebach at 340 milliseconds.  During rapid ventricular pacing, the atrial activation was midline decremental. D.  Deep programmed ventricular stimulation.  Programmed ventricular stimulation was carried out from the right ventricle with base drive cycle length of 147 milliseconds.  The S1-S2 interval was stepwise decreased down to 80 milliseconds where ventricular refractoriness was observed.  During programmed ventricular stimulation, the atrial activation sequence was midline decremental. E.  Rapid atrial pacing.  Rapid atrial pacing was carried out from the coronary sinus demonstrated an AV Wenckebach cycle length of 310 milliseconds.  During rapid atrial pacing, the PR interval was equal to the RR interval and there were multiple echo beats.  Following isoproterenol infusion, rapid atrial pacing resulted in easily inducible SVT. F.  Programmed atrial stimulation.  Programmed atrial stimulation was carried out from the coronary sinus at base drive cycle length of 829 milliseconds.  The S1-S2 interval stepwise decreased down to 280 milliseconds where atrial refractoriness was observed.  During programmed atrial stimulation, there were multiple AH jumps and echo beats.  With the initiation of isoproterenol, there was easily inducible SVT. G.  Arrhythmias observed. 1. AV node reentry tachycardia initiation was with  rapid atrial pacing     and programmed atrial stimulation on isoproterenol.  Duration was     sustained.  Cycle length was 350 milliseconds.  Method of     termination was with rapid atrial pacing.     a.     Mapping.  Mapping of Koch's triangle demonstrated an      unusually small Koch's triangle.  (it was small Koch's triangle I      have ever experienced).     b.     RF energy application total 17RF energy applications were      delivered.  These were delivered from progressively closer areas      to the His  bundle region.  Finally at site 2 in Koch's triangle,      RF energy was applied resulting in the creation of complete heart      block.  CONCLUSION:  This study demonstrates successful electrophysiologic study and RF catheter ablation of AV node reentry tachycardia with subsequent development of complete heart block after termination of the procedure. The patient was observed for 45 minutes and had no recurrent AV conduction.  Pacemaker insertion will be recommended and placed following the procedure dictated in a separate sheet.     Doylene Canning. Ladona Ridgel, MD     GWT/MEDQ  D:  02/06/2011  T:  02/07/2011  Job:  [id number]  cc:   Osvaldo Shipper. Spruill, M.D.  Electronically Signed by Lewayne Bunting MD on 02/23/2011 08:33:56 AM

## 2011-02-23 NOTE — Discharge Summary (Signed)
[name], [name] NO.:  [id number]  MEDICAL RECORD NO.:  [id number]  LOCATION:  6523                         FACILITY:  [facility]  PHYSICIAN:  Doylene Canning. Ladona Ridgel, MD    DATE OF BIRTH:  Apr 05, 1941  DATE OF ADMISSION:  02/06/2011 DATE OF DISCHARGE:  02/07/2011                              DISCHARGE SUMMARY   PRIMARY CARE PHYSICIAN AND PRIMARY CARDIOLOGIST:  Osvaldo Shipper. Spruill, MD  ELECTROPHYSIOLOGIST:  Doylene Canning. Ladona Ridgel, MD  PRIMARY DIAGNOSES: 1. Supraventricular tachycardia. 2. Complete heart block.  SECONDARY DIAGNOSES: 1. Allergic rhinitis. 2. Asthma.  ALLERGIES:  The patient is allergic to ALBUTEROL.  PROCEDURES THIS ADMISSION: 1. Electrophysiology study and radiofrequency catheter ablation of AV     node reentrant tachycardia.  This study demonstrated successful     ablation of AV node reentrant tachycardia with subsequent     development of complete heart block.  The patient was observed 45     minutes postprocedure and had no return of her AV conduction.     Therefore, pacemaker implantation was recommended. 2. Implantation of a dual-chamber pacemaker on February 06, 2011, by Dr.     Ladona Ridgel.  The patient received a [employer], model number 2210     RF pacemaker with model number 2088 TC right atrial and right     ventricular leads.  The patient had no early apparent     complications. 3. Chest x-ray on February 07, 2011, demonstrated no pneumothorax status     post pacemaker implant.  BRIEF HISTORY OF PRESENT ILLNESS:  [name] is a 70 year old female with a longstanding history of tachy palpitations.  She has had documented SVT at rates up to 190 beats per minute.  She has never had syncope, however, she has worsening dyspnea with her episodes.  Because of these things, she was referred to Dr. Ladona Ridgel in the outpatient setting for treatment options.  Risks, benefits, and alternatives of EP study and ablation were discussed with the patient and she  wished to proceed.  HOSPITAL COURSE:  The patient was admitted on February 06, 2011, for planned ablation of SVT.  This was carried out by Dr. Ladona Ridgel with details outlined above.  The patient did develop complete heart block during her ablation.  Therefore, also underwent pacemaker implantation.  The patient was monitored on telemetry overnight which demonstrated AV pacing.  Chest x-ray was obtained which demonstrated no pneumothorax. Device was interrogated and found to be functioning normally.  The patient's left chest was without hematoma or ecchymosis.  Her groin and neck incisions were without hematoma or bruit.  The patient was examined by Dr. Ladona Ridgel on January 08, 2011, and considered stable for discharge.  DISCHARGE INSTRUCTIONS: 1. Increase activity slowly. 2. No driving for 1 week. 3. Follow low-sodium, heart-healthy diet. 4. Keep groin and neck incisions clean and dry. 5. See supplemental device discharge instructions for wound care or     mobility.  FOLLOWUP APPOINTMENTS: 1. [facility] on February 20, 2011, at 11 a.m. 2. Dr. Ladona Ridgel on May 09, 2011, at 10:15 a.m. 3. Dr. Shana Chute as scheduled.  DISCHARGE MEDICATIONS:  Metoprolol/hydrochlorothiazide 50/12.5 mg daily.  DISPOSITION:  The patient was seen and examined by Dr. Ladona Ridgel on February 07, 2011, and considered stable for discharge.  DURATION OF DISCHARGE ENCOUNTER:  Thirty five minutes.     Gypsy Balsam, RN,BSN   ______________________________ Doylene Canning. Ladona Ridgel, MD    AS/MEDQ  D:  02/07/2011  T:  02/07/2011  Job:  [id number]  cc:   Osvaldo Shipper. Spruill, M.D. Eduardo Osier. Sharyn Lull, M.D.  Electronically Signed by Gypsy Balsam RNBSN on 02/09/2011 11:47:55 AM Electronically Signed by Lewayne Bunting MD on 02/23/2011 08:33:49 AM

## 2011-02-23 NOTE — Op Note (Signed)
[name], Farmer NO.:  [id number]  MEDICAL RECORD NO.:  [id number]  LOCATION:  6523                         FACILITY:  [facility]  PHYSICIAN:  Doylene Canning. Ladona Ridgel, MD    DATE OF BIRTH:  Sep 02, 1940  DATE OF PROCEDURE:  02/06/2011 DATE OF DISCHARGE:                              OPERATIVE REPORT   PROCEDURE PERFORMED:  Insertion of dual-chamber pacemaker.  SURGEON:  Doylene Canning. Ladona Ridgel, MD  INDICATIONS:  Symptomatic complete heart block.  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  At time of her procedure, she was found to have an extraordinarily small Koch's triangle and the procedure was ultimately complicated by the development of complete heart block.  She is now referred for permanent pacemaker insertion.  PROCEDURE IN DETAIL:  After informed consent was obtained, the patient was taken to the diagnostic EP lab in a fasting state.  After usual preparation and draping, intravenous fentanyl and midazolam was given for sedation.  30 mL of lidocaine was infiltrated into the left infraclavicular clavicular region.  A 5-cm incision was carried out over this region.  Electrocautery was utilized to dissect down to the fascial plane.  The left subclavian vein was punctured x2 and the St. Jude model 2080T 58-cm active fixation pacing lead, serial number [id number] was advanced into the right ventricle and the St. Jude model 2080T 52-cm active fixation pacing lead, serial number [id number] was advanced into the right atrium.  Mapping was carried out on the right ventricle.  On the final site, the R-waves measured 13 mV and the pacing impedance was 580 ohms.  The pacing threshold was less than 1 volt at 0.5 milliseconds.  There was a large injury current with active fixation lead and 10-volt pacing did not stimulate the diaphragm.  With  the ventricular lead in satisfactory position, attention was turned to placement of the atrial lead and it was placed in the anterolateral portion of the right atrium and P-waves measured 2 mV and the pacing threshold was 1 volt at 0.5 milliseconds.  The 10-volt pacing did not stimulate the diaphragm and the pacing impedance was 400 ohms.  With these satisfactory parameters, the lead was secured to subpectoralis fascia with a figure-of-eight silk suture.  The sewing sleeve was secured with silk suture.  Electrocautery was utilized to make a subcutaneous pocket.  Antibiotic irrigation was utilized to irrigate the pocket and the St. Jude dual-chamber pacemaker, serial number [id number] was connected to the atrial and ventricular leads and placed back in the subcutaneous pocket and secured with silk suture.  The pocket was irrigated with antibiotic irrigation.  The incision closed with 2-0 and 3-0 Vicryl.  Benzoin and Steri-Strips were painted on the skin, a pressure dressing was applied, and the patient was returned to her room in satisfactory condition.  COMPLICATIONS:  There were no immediate procedure complications.  RESULTS:  This demonstrates successful implantation of a St. Jude dual- chamber pacemaker in a patient with complete heart block following catheter ablation of AV node reentrant tachycardia.  Doylene Canning. Ladona Ridgel, MD     GWT/MEDQ  D:  02/06/2011  T:  02/07/2011  Job:  [id number]  cc:   Osvaldo Shipper. Spruill, M.D.  Electronically Signed by Lewayne Bunting MD on 02/23/2011 08:33:53 AM

## 2011-02-28 ENCOUNTER — Ambulatory Visit (INDEPENDENT_AMBULATORY_CARE_PROVIDER_SITE_OTHER): Payer: Medicare PPO | Admitting: Internal Medicine

## 2011-02-28 ENCOUNTER — Encounter: Payer: Self-pay | Admitting: Internal Medicine

## 2011-02-28 VITALS — BP 116/70 | HR 64 | Ht 67.0 in | Wt 279.8 lb

## 2011-02-28 DIAGNOSIS — A1811 Tuberculosis of kidney and ureter: Secondary | ICD-10-CM

## 2011-02-28 DIAGNOSIS — J45909 Unspecified asthma, uncomplicated: Secondary | ICD-10-CM

## 2011-02-28 DIAGNOSIS — J309 Allergic rhinitis, unspecified: Secondary | ICD-10-CM

## 2011-02-28 NOTE — Patient Instructions (Signed)
Continue preventative medicines:  Qvar 80 inhaler- 2 puffs then rinse mouth, twice daily  Singulair pill- one daily  Use rescue medicines only if you need them:  Nebulizer Brovana/atrovent-  Up to 4 times daily  Xopenex HFA inhaler- 2 puffs up to 4 x daily, if needed

## 2011-02-28 NOTE — Progress Notes (Signed)
  Subjective:    Patient ID: [name], female    DOB: [birth year], 70 y.o.   MRN: [id number]  HPI 02/28/11- 64 yoF former smoker, followed for Asthma, allergic rhinitis, complicated by hx PAT Last here July 28, 2010 Has been much better since pacemaker placed July, 2, 2012- breathing better with only a little residual cough/ wheeze. Using Brovana neb 1-2x/ week. Uses Xopenex HFA infrequently. She thinks palpitations were causing her to panic as part of her dyspnea complaint. Remote TB kidney treated medically- never had pulmonary TB.  Review of Systems Constitutional:   No-   weight loss, night sweats, fevers, chills, fatigue, lassitude. HEENT:   No-   headaches, difficulty swallowing, tooth/dental problems, sore throat,                  No-   sneezing, itching, ear ache, nasal congestion, post nasal drip,   CV:  No-   chest pain, orthopnea, PND, swelling in lower extremities, anasarca,  dizziness, palpitations  GI:  No-   heartburn, indigestion, abdominal pain, nausea, vomiting, diarrhea,                 change in bowel habits, loss of appetite  Resp: No-  acute shortness of breath with exertion or at rest.  No-  excess mucus,             No-   productive cough,  No non-productive cough,  No-  coughing up of blood.              No-   change in color of mucus.  No- wheezing.    Skin: No-   rash or lesions.  GU: No-   dysuria, change in color of urine, no urgency or frequency.  No- flank pain.  MS:  No-   joint pain or swelling.  No- decreased range of motion.  No- back pain.  Psych:  No- change in mood or affect. No depression or anxiety.  No memory loss.      Objective:   Physical Exam General- Alert, Oriented, Affect-appropriate, Distress- none acute   Obese  Skin- rash-none, lesions- none, excoriation- none Lymphadenopathy- none Head- atraumatic            Eyes- Gross vision intact, PERRLA, conjunctivae clear secretions            Ears- Hearing, canals normal   Nose- Clear, No-Septal dev, mucus, polyps, erosion, perforation             Throat- Mallampati IIi , mucosa clear , drainage- none, tonsils- atrophic Neck- flexible , trachea midline, no stridor , thyroid nl, carotid no bruit Chest - symmetrical excursion , unlabored           Heart/CV- RRR , no murmur , no gallop  , no rub, nl s1 s2                           - JVD- none , edema- none, stasis changes- none, varices- none           Lung- clear to P&A, wheeze- none, cough- none , dullness-none, rub- none           Chest wall-  Abd- tender-no, distended-no, bowel sounds-present, HSM- no Br/ Gen/ Rectal- Not done, not indicated Extrem- cyanosis- none, clubbing, none, atrophy- none, strength- nl Neuro- grossly intact to observation         Assessment & Plan:

## 2011-02-28 NOTE — Assessment & Plan Note (Addendum)
Symptomatic improvement after pacemaker- indicates pulmonary edema was part of the problem. She understands anxiety from palpitations was also an issue. Now very clear. Question how much bronchodilator she really needs. Will try to continue Qvar and Singulair. Watch using nebulizer and Xopenex only as needed.

## 2011-03-02 ENCOUNTER — Telehealth: Payer: Self-pay | Admitting: Radiology

## 2011-03-02 NOTE — Telephone Encounter (Signed)
error 

## 2011-04-19 ENCOUNTER — Other Ambulatory Visit: Payer: Self-pay | Admitting: Urology

## 2011-04-19 ENCOUNTER — Other Ambulatory Visit: Payer: Self-pay | Admitting: Interventional Radiology

## 2011-04-19 DIAGNOSIS — N281 Cyst of kidney, acquired: Secondary | ICD-10-CM

## 2011-04-25 ENCOUNTER — Other Ambulatory Visit (HOSPITAL_COMMUNITY): Payer: Self-pay | Admitting: Interventional Radiology

## 2011-04-25 DIAGNOSIS — N2889 Other specified disorders of kidney and ureter: Secondary | ICD-10-CM

## 2011-05-05 ENCOUNTER — Other Ambulatory Visit: Payer: Self-pay | Admitting: Interventional Radiology

## 2011-05-06 LAB — CREATININE WITH EST GFR
Creat: 1.08 mg/dL (ref 0.50–1.10)
GFR, Est African American: 60 mL/min — ABNORMAL LOW (ref >60)
GFR, Est Non African American: 50 mL/min — ABNORMAL LOW (ref >60)

## 2011-05-09 ENCOUNTER — Ambulatory Visit (HOSPITAL_COMMUNITY)
Admission: RE | Admit: 2011-05-09 | Discharge: 2011-05-09 | Disposition: A | Discharge status: Home / Self Care | Payer: Medicare PPO | Source: Ambulatory Visit | Attending: Interventional Radiology | Admitting: Interventional Radiology

## 2011-05-09 ENCOUNTER — Ambulatory Visit (INDEPENDENT_AMBULATORY_CARE_PROVIDER_SITE_OTHER): Payer: Medicare PPO | Admitting: Internal Medicine

## 2011-05-09 ENCOUNTER — Encounter: Payer: Self-pay | Admitting: Internal Medicine

## 2011-05-09 DIAGNOSIS — N289 Disorder of kidney and ureter, unspecified: Secondary | ICD-10-CM | POA: Insufficient documentation

## 2011-05-09 DIAGNOSIS — N2889 Other specified disorders of kidney and ureter: Secondary | ICD-10-CM

## 2011-05-09 DIAGNOSIS — Z950 Presence of cardiac pacemaker: Secondary | ICD-10-CM | POA: Insufficient documentation

## 2011-05-09 DIAGNOSIS — J479 Bronchiectasis, uncomplicated: Secondary | ICD-10-CM | POA: Insufficient documentation

## 2011-05-09 DIAGNOSIS — K8020 Calculus of gallbladder without cholecystitis without obstruction: Secondary | ICD-10-CM | POA: Insufficient documentation

## 2011-05-09 DIAGNOSIS — I495 Sick sinus syndrome: Secondary | ICD-10-CM

## 2011-05-09 DIAGNOSIS — I442 Atrioventricular block, complete: Secondary | ICD-10-CM | POA: Insufficient documentation

## 2011-05-09 DIAGNOSIS — I471 Supraventricular tachycardia: Secondary | ICD-10-CM

## 2011-05-09 LAB — PACEMAKER DEVICE OBSERVATION
AL IMPEDENCE PM: 437.5 Ohm
AL THRESHOLD: 0.75 V
ATRIAL PACING PM: 54
BAMS-0003: 70 {beats}/min
BATTERY VOLTAGE: 2.9629 V
VENTRICULAR PACING PM: 100

## 2011-05-09 MED ORDER — IOHEXOL 300 MG/ML  SOLN
100.0000 mL | Freq: Once | INTRAMUSCULAR | Status: AC | PRN
  Administered 2011-05-09: 100 mL via INTRAVENOUS

## 2011-05-09 NOTE — Assessment & Plan Note (Signed)
Her device is working normally. We'll plan to recheck in several months. 

## 2011-05-09 NOTE — Assessment & Plan Note (Signed)
She is asymptomatic. Her SVT is nonsustained. Will undergo a period of Watchful waiting.

## 2011-05-09 NOTE — Assessment & Plan Note (Signed)
I have discussed the importance of weight loss and exercise. I expect that she will lose 10 pounds in the next 6 months.

## 2011-05-09 NOTE — Patient Instructions (Signed)
Your physician wants you to follow-up in: July of 2013 with Dr Court Joy will receive a reminder letter in the mail two months in advance. If you don't receive a letter, please call our office to schedule the follow-up appointment.

## 2011-05-09 NOTE — Progress Notes (Signed)
HPI [name] returns today for followup. She is a pleasant 70 year old woman with complete heart block status post permanent pacemaker insertion. She also has hypertension and obesity. The patient denies chest pain or shortness of breath. She denies peripheral edema. She does admit to being sedentary. Allergies  Allergen Reactions  . Albuterol     REACTION: irregular heartbeat     Current Outpatient Prescriptions  Medication Sig Dispense Refill  . arformoterol (BROVANA) 15 MCG/2ML NEBU Take 15 mcg by nebulization 2 (two) times daily.        . beclomethasone (QVAR) 80 MCG/ACT inhaler Inhale 1 puff into the lungs daily as needed. Rinse mouth       . ipratropium (ATROVENT) 0.02 % nebulizer solution Take 500 mcg by nebulization 4 (four) times daily.        Marland Kitchen levalbuterol (XOPENEX HFA) 45 MCG/ACT inhaler Inhale 1-2 puffs into the lungs every 4 (four) hours as needed.        . Metoprolol-Hydrochlorothiazide 50-12.5 MG TB24 Take 1 tablet by mouth daily.        . montelukast (SINGULAIR) 10 MG tablet Take 10 mg by mouth at bedtime.           Past Medical History  Diagnosis Date  . Allergic rhinitis   . Asthma   . PAT (paroxysmal atrial tachycardia) 2008  . 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   Past Surgical History  Procedure Date  . Tonsillectomy   . Hemorrhoid surgery   . Tah and bso   . Pacemaker insertion 02-06-2011     No family history on file.   History   Social History  . Marital Status: Widowed    Spouse Name: N/A    Number of Children: 2  . Years of Education: N/A   Occupational History  . cna    Social History Main Topics  . Smoking status: Former Smoker    Quit date: 08/07/1977  . Smokeless tobacco: Not on file  . Alcohol Use: No     quit 1979  . Drug Use: Not on file  . Sexually Active: Not on file   Other Topics Concern  . Not on file   Social History  Narrative   Pt has 11 siblings in all     BP 130/76  Pulse 64  Ht 5' 7.5" (1.715 m)  Wt 279 lb 6.4 oz (126.735 kg)  BMI 43.11 kg/m2  Physical Exam:  Obese,Well appearing woman NAD HEENT: Unremarkable Neck:  No JVD, no thyromegally Lymphatics:  No adenopathy Back:  No CVA tenderness Lungs:  Clear HEART:  Regular rate rhythm, no murmurs, no rubs, no clicks Abd:  Obese, soft, positive bowel sounds, no organomegally, no rebound, no guarding Ext:  2 plus pulses, no edema, no cyanosis, no clubbing Skin:  No rashes no nodules Neuro:  CN II through XII intact, motor grossly intact  DEVICE  Normal device function.  See PaceArt for details.   Assess/Plan:

## 2011-05-10 ENCOUNTER — Ambulatory Visit
Admission: RE | Admit: 2011-05-10 | Discharge: 2011-05-10 | Disposition: A | Discharge status: Home / Self Care | Payer: Medicare PPO | Source: Ambulatory Visit | Attending: Interventional Radiology | Admitting: Interventional Radiology

## 2011-05-10 VITALS — BP 136/76 | HR 64 | Temp 98.1°F | Resp 18 | Ht 66.0 in | Wt 279.4 lb

## 2011-05-10 DIAGNOSIS — N281 Cyst of kidney, acquired: Secondary | ICD-10-CM

## 2011-05-10 NOTE — Progress Notes (Signed)
Denies hematuria.   Denies Right flank pain.  Doing well since pacemaker placement of 02/06/2011.  Endurance is improving.    Has resumed walking as tolerated for exercise.

## 2011-05-11 ENCOUNTER — Other Ambulatory Visit: Payer: Self-pay | Admitting: Interventional Radiology

## 2011-05-11 DIAGNOSIS — N2889 Other specified disorders of kidney and ureter: Secondary | ICD-10-CM

## 2011-05-30 ENCOUNTER — Ambulatory Visit: Payer: Medicare PPO | Admitting: Internal Medicine

## 2012-03-04 ENCOUNTER — Other Ambulatory Visit: Payer: Self-pay | Admitting: Cardiology

## 2012-03-04 ENCOUNTER — Ambulatory Visit
Admission: RE | Admit: 2012-03-04 | Discharge: 2012-03-04 | Disposition: A | Discharge status: Home / Self Care | Payer: Medicare Other | Source: Ambulatory Visit | Attending: Cardiology | Admitting: Cardiology

## 2012-03-04 DIAGNOSIS — I509 Heart failure, unspecified: Secondary | ICD-10-CM

## 2012-03-04 DIAGNOSIS — M79673 Pain in unspecified foot: Secondary | ICD-10-CM

## 2012-03-04 DIAGNOSIS — N2889 Other specified disorders of kidney and ureter: Secondary | ICD-10-CM

## 2012-03-04 DIAGNOSIS — N6452 Nipple discharge: Secondary | ICD-10-CM

## 2012-03-07 ENCOUNTER — Ambulatory Visit
Admission: RE | Admit: 2012-03-07 | Discharge: 2012-03-07 | Disposition: A | Discharge status: Home / Self Care | Payer: Medicare Other | Source: Ambulatory Visit | Attending: Cardiology | Admitting: Cardiology

## 2012-03-07 ENCOUNTER — Other Ambulatory Visit: Payer: Self-pay | Admitting: Cardiology

## 2012-03-07 ENCOUNTER — Inpatient Hospital Stay: Admission: RE | Admit: 2012-03-07 | Payer: Medicare Other | Source: Ambulatory Visit

## 2012-03-07 DIAGNOSIS — N6452 Nipple discharge: Secondary | ICD-10-CM

## 2012-03-08 ENCOUNTER — Other Ambulatory Visit: Payer: Medicare PPO

## 2012-03-15 ENCOUNTER — Telehealth: Payer: Self-pay | Admitting: Internal Medicine

## 2012-03-15 NOTE — Telephone Encounter (Signed)
Error

## 2012-03-21 ENCOUNTER — Encounter: Payer: Self-pay | Admitting: *Deleted

## 2012-03-29 ENCOUNTER — Encounter: Payer: Self-pay | Admitting: Internal Medicine

## 2012-03-29 ENCOUNTER — Ambulatory Visit (INDEPENDENT_AMBULATORY_CARE_PROVIDER_SITE_OTHER): Payer: Medicare Other | Admitting: Internal Medicine

## 2012-03-29 VITALS — BP 126/64 | HR 81 | Ht 67.0 in | Wt 288.2 lb

## 2012-03-29 DIAGNOSIS — E66813 Obesity, class 3: Secondary | ICD-10-CM

## 2012-03-29 DIAGNOSIS — I442 Atrioventricular block, complete: Secondary | ICD-10-CM

## 2012-03-29 DIAGNOSIS — Z950 Presence of cardiac pacemaker: Secondary | ICD-10-CM

## 2012-03-29 LAB — PACEMAKER DEVICE OBSERVATION
AL AMPLITUDE: 5 mv
AL IMPEDENCE PM: 400 Ohm
AL THRESHOLD: 0.75 V
ATRIAL PACING PM: 16
BAMS-0001: 170 {beats}/min
DEVICE MODEL PM: 7254877
RV LEAD IMPEDENCE PM: 437.5 Ohm
RV LEAD THRESHOLD: 1 V

## 2012-03-29 NOTE — Assessment & Plan Note (Signed)
I've discussed the importance of weight loss. She states she will try to do better. I've asked her to reduce her fat intake.

## 2012-03-29 NOTE — Progress Notes (Signed)
HPI [name] returns today for followup. She is a very pleasant 71 year old woman with a history of obesity, hypertension, COPD, and symptomatic tachybradycardia syndrome. She is status post permanent pacemaker insertion. She is recently been found to have an abnormal biopsy of her breast and is scheduled for more procedure. The patient denies chest pain or shortness of breath. She does note dietary indiscretion particularly with sodium and has had some problems with peripheral edema. She intermittently uses her Lasix. She has minimal dyspnea. She has been frustrated by her inability to lose weight. Allergies  Allergen Reactions  . Albuterol     REACTION: irregular heartbeat     Current Outpatient Prescriptions  Medication Sig Dispense Refill  . arformoterol (BROVANA) 15 MCG/2ML NEBU Take 15 mcg by nebulization 2 (two) times daily.        . beclomethasone (QVAR) 80 MCG/ACT inhaler Inhale 1 puff into the lungs daily as needed. Rinse mouth       . furosemide (LASIX) 40 MG tablet Take 40 mg by mouth 2 (two) times daily.      Marland Kitchen ipratropium (ATROVENT) 0.02 % nebulizer solution Take 500 mcg by nebulization as needed.       Marland Kitchen KRILL OIL PO Take 1 capsule by mouth daily.      Marland Kitchen levalbuterol (XOPENEX HFA) 45 MCG/ACT inhaler Inhale 1-2 puffs into the lungs every 4 (four) hours as needed.        . Metoprolol-Hydrochlorothiazide 50-12.5 MG TB24 Take 1 tablet by mouth daily.        Marland Kitchen olmesartan (BENICAR) 40 MG tablet Take 20 mg by mouth daily.         Past Medical History  Diagnosis Date  . Allergic rhinitis   . Asthma   . PAT (paroxysmal atrial tachycardia) 2008  . 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   Past Surgical History  Procedure Date  . Tonsillectomy   . Hemorrhoid surgery   . Tah and bso   . Pacemaker insertion 02-06-2011     No family history on file.   History   Social History  .  Marital Status: Widowed    Spouse Name: N/A    Number of Children: 2  . Years of Education: N/A   Occupational History  . cna    Social History Main Topics  . Smoking status: Former Smoker    Quit date: 08/07/1977  . Smokeless tobacco: Not on file  . Alcohol Use: No     quit 1979  . Drug Use: Not on file  . Sexually Active: Not on file   Other Topics Concern  . Not on file   Social History Narrative   Pt has 11 siblings in all     BP 126/64  Pulse 81  Ht 5\' 7"  (1.702 m)  Wt 288 lb 3.2 oz (130.727 kg)  BMI 45.14 kg/m2  Physical Exam:   Obese appearing middle-aged woman, NAD HEENT: Unremarkable Neck:  No JVD, no thyromegally Lungs:  Clear with no wheezes, rales, or rhonchi. HEART:  Regular rate rhythm, no murmurs, no rubs, no clicks Abd:  soft, positive bowel sounds, no organomegally, no rebound, no guarding Ext:  2 plus pulses, no edema, no cyanosis, no clubbing Skin:  No rashes no nodules Neuro:  CN II through XII intact, motor grossly intact  DEVICE  Normal device function.  See PaceArt for details.   Assess/Plan:

## 2012-03-29 NOTE — Assessment & Plan Note (Signed)
Her device is working normally. We'll plan to recheck in several months. 

## 2012-03-29 NOTE — Patient Instructions (Addendum)
Your physician wants you to follow-up in: 12 months with Dr Taylor You will receive a reminder letter in the mail two months in advance. If you don't receive a letter, please call our office to schedule the follow-up appointment.   Remote monitoring is used to monitor your Pacemaker of ICD from home. This monitoring reduces the number of office visits required to check your device to one time per year. It allows us to keep an eye on the functioning of your device to ensure it is working properly. You are scheduled for a device check from home on 07/01/12. You may send your transmission at any time that day. If you have a wireless device, the transmission will be sent automatically. After your physician reviews your transmission, you will receive a postcard with your next transmission date.   

## 2012-04-01 ENCOUNTER — Encounter (INDEPENDENT_AMBULATORY_CARE_PROVIDER_SITE_OTHER): Payer: Self-pay | Admitting: General Surgery

## 2012-04-01 ENCOUNTER — Ambulatory Visit (INDEPENDENT_AMBULATORY_CARE_PROVIDER_SITE_OTHER): Payer: Medicare Other | Admitting: General Surgery

## 2012-04-01 VITALS — BP 130/62 | HR 72 | Temp 97.2°F | Resp 20 | Ht 67.5 in | Wt 279.0 lb

## 2012-04-01 DIAGNOSIS — N6452 Nipple discharge: Secondary | ICD-10-CM

## 2012-04-01 DIAGNOSIS — N6459 Other signs and symptoms in breast: Secondary | ICD-10-CM

## 2012-04-01 NOTE — Progress Notes (Signed)
Patient ID: [name], female   DOB: 1941/04/07, 71 y.o.   MRN: [id number]  Chief Complaint  Patient presents with  . 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 female.  Referred by Dr. Christiana Pellant HPI This is a 71 year old female with multiple problems has no prior history of any breast complaints. She recently has had left nipple discharge and has been spontaneous it is noted to be torqued and appearing to be old blood. This had been watering about 3-4 months ago and then she had a bloody discharge. She has had occasional clear discharge from the right nipple throughout her entire life. She reports no other complaints referable to her breast today. She does have a family history of a sister in her 66s who developed breast cancer. Past Medical History  Diagnosis Date  . Allergic rhinitis   . Asthma   . PAT (paroxysmal atrial tachycardia) 2008  . 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 Tonsillectomy   . Hemorrhoid surgery   . Tah and bso   . Pacemaker insertion 02-06-2011    No family history on file.  Social History History  Substance Use Topics  . Smoking status: Former Smoker    Quit date: 08/07/1977  . Smokeless tobacco: Not on file  . Alcohol Use: No     quit 1979    Allergies  Allergen Reactions  . Albuterol     REACTION: irregular heartbeat    Current Outpatient Prescriptions  Medication Sig Dispense Refill  . arformoterol (BROVANA) 15 MCG/2ML NEBU Take 15 mcg by nebulization 2 (two) times daily.        . beclomethasone (QVAR) 80 MCG/ACT inhaler Inhale 1 puff into the lungs daily as needed. Rinse mouth       . furosemide (LASIX) 40 MG tablet Take 40 mg by mouth 2 (two) times daily.      Marland Kitchen ipratropium (ATROVENT) 0.02 % nebulizer solution Take 500 mcg by nebulization as needed.       Marland Kitchen KRILL OIL PO Take 1 capsule by mouth daily.      Marland Kitchen levalbuterol (XOPENEX HFA) 45 MCG/ACT  inhaler Inhale 1-2 puffs into the lungs every 4 (four) hours as needed.        . Metoprolol-Hydrochlorothiazide 50-12.5 MG TB24 Take 1 tablet by mouth daily.        Marland Kitchen olmesartan (BENICAR) 40 MG tablet Take 20 mg by mouth daily.        Review of Systems Review of Systems  Constitutional: Negative for fever, chills and unexpected weight change.  HENT: Positive for congestion and voice change. Negative for hearing loss, sore throat and trouble swallowing.   Eyes: Negative for visual disturbance.  Respiratory: Positive for cough and wheezing.   Cardiovascular: Positive for palpitations and leg swelling. Negative for chest pain.  Gastrointestinal: Positive for constipation. Negative for nausea, vomiting, abdominal pain, diarrhea, blood in stool, abdominal distention and anal bleeding.  Genitourinary: Negative for hematuria, vaginal bleeding and difficulty urinating.  Musculoskeletal: Positive for arthralgias.  Skin: Negative for rash and wound.  Neurological: Positive for weakness. Negative for seizures, syncope and headaches.  Hematological: Negative for adenopathy. Does not bruise/bleed easily.  Psychiatric/Behavioral: Negative for confusion.    There were no vitals taken for this visit.  Physical Exam Physical Exam  Vitals reviewed.  Constitutional: She appears well-developed and well-nourished.  Neck: Neck supple.  Cardiovascular: Normal rate, regular rhythm and normal heart sounds.   Pulmonary/Chest: Effort normal. She has wheezes (occ expiratory). Right breast exhibits no inverted nipple, no mass, no nipple discharge, no skin change and no tenderness. Left breast exhibits no inverted nipple, no mass, no nipple discharge, no skin change and no tenderness. Breasts are symmetrical.  Lymphadenopathy:    She has no cervical adenopathy.    She has no axillary adenopathy.    Data Reviewed DIGITAL DIAGNOSTIC BILATERAL MAMMOGRAM WITH CAD AND LEFT BREAST  ULTRASOUND:  Comparison: Prior  exams  Findings: The breast parenchyma is predominantly fatty. No  suspicious mass, calcification, or architectural distortion is  seen.  Left-sided pacer in place.  Mammographic images were processed with CAD.  Ultrasound is performed, showing an intraductal 5 mm mass within a  mildly dilated duct in the left breast three o'clock location  subareolar area. On initial ultrasound exam prior to physical  exam, the mass appeared to be in the 12 o'clock area of the left  breast, subareolar location, but then with repositioning the breast  and correlation with physical exam findings, it became clear that  the intraductal mass is in the subareolar left breast three o'clock  location, within the mildly dilated duct.  On physical exam, I am able to elicit brownish single duct left  nipple discharge from the three o'clock location.  IMPRESSION:  Intraductal 5 mm left breast mass 3 o'clock location with  associated duct ectasia and expressible bloody left nipple  discharge. Findings are suspicious for papilloma, papillary  cancer, or DCIS. Inflammation with intraductal material would be  considered much less likely. The lesion is too superficial and  central to the nipple areolar complex for ultrasound-guided core  biopsy. Surgical excision is recommended, and could be preceded by  needle localization under sonographic guidance if needed. Findings  and recommendations discussed with the patient and provided in  written form at the time of the exam. No evidence for malignancy  in the right breast.   Assessment    Bloody left nipple discharge    Plan    We discussed a left breast wire-guided excisional biopsy of this lesion due to the bloody nipple discharge. There is a chance this could be a carcinoma this is unlikely. I am going to have to obtain a perioperative evaluation from cardiology prior to beginning. Once this is done we will plan on getting her scheduled. We discussed the left  breast wire localization biopsy with her as being but not limited to bleeding, infection, further surgeries or cancer is identified.       [name] 04/01/2012, 3:14 PM

## 2012-04-24 ENCOUNTER — Encounter (HOSPITAL_COMMUNITY): Payer: Self-pay | Admitting: Pharmacy Technician

## 2012-04-24 ENCOUNTER — Telehealth (INDEPENDENT_AMBULATORY_CARE_PROVIDER_SITE_OTHER): Payer: Self-pay

## 2012-04-24 ENCOUNTER — Encounter (INDEPENDENT_AMBULATORY_CARE_PROVIDER_SITE_OTHER): Payer: Self-pay

## 2012-04-24 ENCOUNTER — Other Ambulatory Visit (INDEPENDENT_AMBULATORY_CARE_PROVIDER_SITE_OTHER): Payer: Self-pay | Admitting: General Surgery

## 2012-04-24 DIAGNOSIS — N6452 Nipple discharge: Secondary | ICD-10-CM

## 2012-04-24 NOTE — Telephone Encounter (Signed)
Called pt to notify her that the cardiac clearance had been received by Dr Ladona Ridgel but we did not get clearance from Dr Shana Chute. Dr Dwain Sarna said that it's ok to go ahead and get pt scheduled for surgery with just Dr Lubertha Basque clearance. I took pt's info. To our surgery scheduler. The pt understands.

## 2012-04-25 ENCOUNTER — Other Ambulatory Visit (INDEPENDENT_AMBULATORY_CARE_PROVIDER_SITE_OTHER): Payer: Self-pay | Admitting: General Surgery

## 2012-04-25 DIAGNOSIS — N6452 Nipple discharge: Secondary | ICD-10-CM

## 2012-04-26 ENCOUNTER — Encounter (HOSPITAL_COMMUNITY)
Admission: RE | Admit: 2012-04-26 | Discharge: 2012-04-26 | Payer: Medicare PPO | Source: Ambulatory Visit | Attending: General Surgery | Admitting: General Surgery

## 2012-04-26 ENCOUNTER — Encounter (HOSPITAL_COMMUNITY): Payer: Self-pay

## 2012-04-26 HISTORY — DX: Anemia, unspecified: D64.9

## 2012-04-26 HISTORY — DX: Unspecified osteoarthritis, unspecified site: M19.90

## 2012-04-26 HISTORY — DX: Pneumonia, unspecified organism: J18.9

## 2012-04-26 HISTORY — DX: Presence of cardiac pacemaker: Z95.0

## 2012-04-26 NOTE — Progress Notes (Signed)
Echo 05, stress 10,dr taylor note 8/13 cxr 7/13 ,faxed pacemaker order form to dr taylor

## 2012-04-29 MED ORDER — DEXTROSE 5 % IV SOLN
3.0000 g | INTRAVENOUS | Status: AC
  Administered 2012-04-30: 3 g via INTRAVENOUS
  Filled 2012-04-29: qty 3000

## 2012-04-30 ENCOUNTER — Encounter (HOSPITAL_COMMUNITY): Payer: Self-pay | Admitting: *Deleted

## 2012-04-30 ENCOUNTER — Ambulatory Visit (HOSPITAL_COMMUNITY)
Admission: RE | Admit: 2012-04-30 | Discharge: 2012-05-01 | Disposition: A | Discharge status: Home / Self Care | Payer: Medicare PPO | Source: Ambulatory Visit | Attending: General Surgery | Admitting: General Surgery

## 2012-04-30 ENCOUNTER — Ambulatory Visit
Admission: RE | Admit: 2012-04-30 | Discharge: 2012-04-30 | Disposition: A | Discharge status: Home / Self Care | Payer: Medicare PPO | Source: Ambulatory Visit | Attending: General Surgery | Admitting: General Surgery

## 2012-04-30 ENCOUNTER — Encounter (HOSPITAL_COMMUNITY)
Admission: RE | Disposition: A | Discharge status: Home / Self Care | Payer: Self-pay | Source: Ambulatory Visit | Attending: General Surgery

## 2012-04-30 ENCOUNTER — Encounter (HOSPITAL_COMMUNITY): Payer: Self-pay | Admitting: Critical Care Medicine

## 2012-04-30 ENCOUNTER — Other Ambulatory Visit: Payer: Self-pay

## 2012-04-30 ENCOUNTER — Ambulatory Visit (HOSPITAL_COMMUNITY): Payer: Medicare PPO | Admitting: Critical Care Medicine

## 2012-04-30 ENCOUNTER — Other Ambulatory Visit (INDEPENDENT_AMBULATORY_CARE_PROVIDER_SITE_OTHER): Payer: Self-pay | Admitting: General Surgery

## 2012-04-30 DIAGNOSIS — Z23 Encounter for immunization: Secondary | ICD-10-CM | POA: Insufficient documentation

## 2012-04-30 DIAGNOSIS — D249 Benign neoplasm of unspecified breast: Secondary | ICD-10-CM

## 2012-04-30 DIAGNOSIS — J4489 Other specified chronic obstructive pulmonary disease: Secondary | ICD-10-CM | POA: Insufficient documentation

## 2012-04-30 DIAGNOSIS — N6452 Nipple discharge: Secondary | ICD-10-CM

## 2012-04-30 DIAGNOSIS — I10 Essential (primary) hypertension: Secondary | ICD-10-CM | POA: Insufficient documentation

## 2012-04-30 DIAGNOSIS — J449 Chronic obstructive pulmonary disease, unspecified: Secondary | ICD-10-CM | POA: Insufficient documentation

## 2012-04-30 HISTORY — PX: BREAST BIOPSY: SHX20

## 2012-04-30 LAB — BASIC METABOLIC PANEL
BUN: 17 mg/dL (ref 6–23)
Chloride: 106 mEq/L (ref 96–112)
Creatinine, Ser: 0.96 mg/dL (ref 0.50–1.10)
GFR calc Af Amer: 66 mL/min — ABNORMAL LOW (ref >90)
Glucose, Bld: 100 mg/dL — ABNORMAL HIGH (ref 70–99)

## 2012-04-30 LAB — CBC WITH DIFFERENTIAL/PLATELET
Basophils Absolute: 0 10*3/uL (ref 0.0–0.1)
Basophils Relative: 0 % (ref 0–1)
Eosinophils Absolute: 0.1 10*3/uL (ref 0.0–0.7)
Eosinophils Relative: 1 % (ref 0–5)
HCT: 39.8 % (ref 36.0–46.0)
MCH: 30.9 pg (ref 26.0–34.0)
MCHC: 33.4 g/dL (ref 30.0–36.0)
MCV: 92.6 fL (ref 78.0–100.0)
Monocytes Absolute: 0.6 10*3/uL (ref 0.1–1.0)
Neutro Abs: 7.2 10*3/uL (ref 1.7–7.7)
RDW: 12.7 % (ref 11.5–15.5)

## 2012-04-30 LAB — SURGICAL PCR SCREEN: Staphylococcus aureus: NEGATIVE

## 2012-04-30 SURGERY — BREAST BIOPSY WITH NEEDLE LOCALIZATION
Anesthesia: Choice | Site: Breast | Laterality: Left | Wound class: Clean

## 2012-04-30 MED ORDER — BUPIVACAINE HCL (PF) 0.25 % IJ SOLN
INTRAMUSCULAR | Status: DC | PRN
  Administered 2012-04-30: 10 mL

## 2012-04-30 MED ORDER — FENTANYL CITRATE 0.05 MG/ML IJ SOLN
INTRAMUSCULAR | Status: AC
  Filled 2012-04-30: qty 2

## 2012-04-30 MED ORDER — MORPHINE SULFATE 2 MG/ML IJ SOLN
1.0000 mg | INTRAMUSCULAR | Status: DC | PRN
  Administered 2012-04-30: 1 mg via INTRAVENOUS
  Filled 2012-04-30 (×2): qty 1

## 2012-04-30 MED ORDER — MUPIROCIN 2 % EX OINT
TOPICAL_OINTMENT | Freq: Two times a day (BID) | CUTANEOUS | Status: DC
  Administered 2012-04-30: 1 via NASAL
  Filled 2012-04-30 (×2): qty 22

## 2012-04-30 MED ORDER — IPRATROPIUM BROMIDE 0.02 % IN SOLN: 500.0000 ug | RESPIRATORY_TRACT | Status: DC | PRN

## 2012-04-30 MED ORDER — BUPIVACAINE-EPINEPHRINE PF 0.25-1:200000 % IJ SOLN
INTRAMUSCULAR | Status: AC
  Filled 2012-04-30: qty 30

## 2012-04-30 MED ORDER — LACTATED RINGERS IV SOLN
INTRAVENOUS | Status: DC | PRN
  Administered 2012-04-30: 11:00:00 via INTRAVENOUS

## 2012-04-30 MED ORDER — BUPIVACAINE HCL (PF) 0.25 % IJ SOLN
INTRAMUSCULAR | Status: AC
  Filled 2012-04-30: qty 30

## 2012-04-30 MED ORDER — FLUTICASONE PROPIONATE HFA 44 MCG/ACT IN AERO
1.0000 | INHALATION_SPRAY | Freq: Two times a day (BID) | RESPIRATORY_TRACT | Status: DC
  Administered 2012-05-01: 1 via RESPIRATORY_TRACT
  Filled 2012-04-30: qty 10.6

## 2012-04-30 MED ORDER — FENTANYL CITRATE 0.05 MG/ML IJ SOLN
INTRAMUSCULAR | Status: DC | PRN
  Administered 2012-04-30: 100 ug via INTRAVENOUS
  Administered 2012-04-30: 50 ug via INTRAVENOUS

## 2012-04-30 MED ORDER — OXYCODONE HCL 5 MG PO TABS
5.0000 mg | ORAL_TABLET | ORAL | Status: DC | PRN
  Administered 2012-04-30: 5 mg via ORAL
  Filled 2012-04-30: qty 1

## 2012-04-30 MED ORDER — MEPERIDINE HCL 25 MG/ML IJ SOLN: 6.2500 mg | INTRAMUSCULAR | Status: DC | PRN

## 2012-04-30 MED ORDER — FUROSEMIDE 40 MG PO TABS
40.0000 mg | ORAL_TABLET | Freq: Every day | ORAL | Status: DC
  Administered 2012-04-30 – 2012-05-01 (×2): 40 mg via ORAL
  Filled 2012-04-30 (×3): qty 1

## 2012-04-30 MED ORDER — INFLUENZA VIRUS VACC SPLIT PF IM SUSP
0.5000 mL | INTRAMUSCULAR | Status: AC
  Administered 2012-05-01: 0.5 mL via INTRAMUSCULAR
  Filled 2012-04-30: qty 0.5

## 2012-04-30 MED ORDER — ARFORMOTEROL TARTRATE 15 MCG/2ML IN NEBU
15.0000 ug | INHALATION_SOLUTION | Freq: Two times a day (BID) | RESPIRATORY_TRACT | Status: DC
  Administered 2012-05-01: 15 ug via RESPIRATORY_TRACT
  Filled 2012-04-30 (×4): qty 2

## 2012-04-30 MED ORDER — MIDAZOLAM HCL 2 MG/2ML IJ SOLN: 0.5000 mg | Freq: Once | INTRAMUSCULAR | Status: DC | PRN

## 2012-04-30 MED ORDER — IRBESARTAN 75 MG PO TABS
75.0000 mg | ORAL_TABLET | Freq: Every day | ORAL | Status: DC
  Administered 2012-04-30 – 2012-05-01 (×2): 75 mg via ORAL
  Filled 2012-04-30 (×3): qty 1

## 2012-04-30 MED ORDER — FENTANYL CITRATE 0.05 MG/ML IJ SOLN
25.0000 ug | INTRAMUSCULAR | Status: DC | PRN
  Administered 2012-04-30: 50 ug via INTRAVENOUS

## 2012-04-30 MED ORDER — ACETAMINOPHEN 650 MG RE SUPP: 650.0000 mg | Freq: Four times a day (QID) | RECTAL | Status: DC | PRN

## 2012-04-30 MED ORDER — ONDANSETRON HCL 4 MG/2ML IJ SOLN: 4.0000 mg | Freq: Four times a day (QID) | INTRAMUSCULAR | Status: DC | PRN

## 2012-04-30 MED ORDER — 0.9 % SODIUM CHLORIDE (POUR BTL) OPTIME
TOPICAL | Status: DC | PRN
  Administered 2012-04-30: 1000 mL

## 2012-04-30 MED ORDER — LEVALBUTEROL TARTRATE 45 MCG/ACT IN AERO
1.0000 | INHALATION_SPRAY | RESPIRATORY_TRACT | Status: DC | PRN
  Filled 2012-04-30: qty 15

## 2012-04-30 MED ORDER — PROMETHAZINE HCL 25 MG/ML IJ SOLN: 6.2500 mg | INTRAMUSCULAR | Status: DC | PRN

## 2012-04-30 MED ORDER — SODIUM CHLORIDE 0.9 % IV SOLN
INTRAVENOUS | Status: DC
  Administered 2012-04-30: via INTRAVENOUS

## 2012-04-30 MED ORDER — ONDANSETRON HCL 4 MG/2ML IJ SOLN
INTRAMUSCULAR | Status: DC | PRN
  Administered 2012-04-30: 4 mg via INTRAVENOUS

## 2012-04-30 MED ORDER — PROPOFOL 10 MG/ML IV BOLUS
INTRAVENOUS | Status: DC | PRN
  Administered 2012-04-30: 200 mg via INTRAVENOUS

## 2012-04-30 MED ORDER — ACETAMINOPHEN 325 MG PO TABS: 650.0000 mg | ORAL_TABLET | Freq: Four times a day (QID) | ORAL | Status: DC | PRN

## 2012-04-30 SURGICAL SUPPLY — 40 items
ADH SKN CLS APL DERMABOND .7 (GAUZE/BANDAGES/DRESSINGS) ×1
APPLIER CLIP 9.375 MED OPEN (MISCELLANEOUS)
APR CLP MED 9.3 20 MLT OPN (MISCELLANEOUS)
BINDER BREAST LRG (GAUZE/BANDAGES/DRESSINGS) IMPLANT
BINDER BREAST XLRG (GAUZE/BANDAGES/DRESSINGS) IMPLANT
CANISTER SUCTION 2500CC (MISCELLANEOUS) IMPLANT
CHLORAPREP W/TINT 26ML (MISCELLANEOUS) ×2 IMPLANT
CLIP APPLIE 9.375 MED OPEN (MISCELLANEOUS) IMPLANT
CLOTH BEACON ORANGE TIMEOUT ST (SAFETY) ×2 IMPLANT
COVER SURGICAL LIGHT HANDLE (MISCELLANEOUS) ×2 IMPLANT
DECANTER SPIKE VIAL GLASS SM (MISCELLANEOUS) ×1 IMPLANT
DERMABOND ADVANCED (GAUZE/BANDAGES/DRESSINGS) ×1
DERMABOND ADVANCED .7 DNX12 (GAUZE/BANDAGES/DRESSINGS) IMPLANT
DEVICE DUBIN SPECIMEN MAMMOGRA (MISCELLANEOUS) ×2 IMPLANT
DRAPE CHEST BREAST 15X10 FENES (DRAPES) ×2 IMPLANT
ELECT CAUTERY BLADE 6.4 (BLADE) ×2 IMPLANT
ELECT REM PT RETURN 9FT ADLT (ELECTROSURGICAL) ×2
ELECTRODE REM PT RTRN 9FT ADLT (ELECTROSURGICAL) ×1 IMPLANT
GLOVE BIO SURGEON STRL SZ7 (GLOVE) ×2 IMPLANT
GLOVE BIOGEL PI IND STRL 7.0 (GLOVE) IMPLANT
GLOVE BIOGEL PI IND STRL 7.5 (GLOVE) ×1 IMPLANT
GLOVE BIOGEL PI INDICATOR 7.0 (GLOVE) ×1
GLOVE BIOGEL PI INDICATOR 7.5 (GLOVE) ×1
GLOVE ECLIPSE STER SZ 5.5 (GLOVE) ×1 IMPLANT
GOWN STRL NON-REIN LRG LVL3 (GOWN DISPOSABLE) ×4 IMPLANT
KIT BASIN OR (CUSTOM PROCEDURE TRAY) ×2 IMPLANT
KIT MARKER MARGIN INK (KITS) IMPLANT
KIT ROOM TURNOVER OR (KITS) ×2 IMPLANT
NDL HYPO 25GX1X1/2 BEV (NEEDLE) ×1 IMPLANT
NEEDLE HYPO 25GX1X1/2 BEV (NEEDLE) ×2 IMPLANT
NS IRRIG 1000ML POUR BTL (IV SOLUTION) ×2 IMPLANT
PACK GENERAL/GYN (CUSTOM PROCEDURE TRAY) ×2 IMPLANT
PAD ARMBOARD 7.5X6 YLW CONV (MISCELLANEOUS) ×4 IMPLANT
STAPLER VISISTAT 35W (STAPLE) ×2 IMPLANT
SUT MNCRL AB 4-0 PS2 18 (SUTURE) ×2 IMPLANT
SUT SILK 2 0 SH (SUTURE) IMPLANT
SUT VIC AB 3-0 SH 18 (SUTURE) ×1 IMPLANT
SYR CONTROL 10ML LL (SYRINGE) ×2 IMPLANT
TOWEL OR 17X24 6PK STRL BLUE (TOWEL DISPOSABLE) ×2 IMPLANT
TOWEL OR 17X26 10 PK STRL BLUE (TOWEL DISPOSABLE) ×2 IMPLANT

## 2012-04-30 NOTE — Op Note (Signed)
Preoperative diagnosis: Left breast mass and nipple discharge Postoperative diagnosis: Same as above Procedure: Left breast wire localization biopsy Surgeon: Dr. Harden Mo Anesthesia: Gen. With LMA Estimated blood loss: Minimal Complications: None Specimens: Left breast tissue with nodule marked short stitch superior, long stitch lateral, double suture deep. There is other breast tissue present and is on marked due to the fact that this was fatty tissue that just fell apart and separated from the original mass Drains: None Sponge and needle count correct x2 at end of operation Disposition to recovery stable  Indications: This is a 71 year old female who has multiple medical problems who presented with left breast nipple discharge. She underwent evaluation was found to have an 8 mm mass the be palpable as well underneath her areola. She underwent evaluation by cardiology. I then schedule her for a left breast wire localization biopsy just to make sure that I excised the correct area.  Procedure: After informed consent was obtained the patient was first taken to the breast center where she had a wire placed by Dr. Jean Rosenthal. She was then brought to the operating room. She was administered cefazolin. Sequential compression devices were placed. She was then placed under general anesthesia with an LMA. Her left breast was prepped and draped in the standard sterile surgical fashion. A pacemaker was also prepped into place.  I secured a magnet overlying her pacemaker. I then made a periareolar incision. I carried this out to underneath the nipple. The mass was indeed palpable I excised this as well as the wire with electrocautery in total. The ductal system that was around this and containing a clip was also there and I excised this. This was just fatty tissue and it really just separated itself from the original mass that this goes to pathology on marked separately. I marked the mass as above.  Hemostasis was observed. Radiology confirmed removal. I then sutured the breast tissue closed with a 3-0 Vicryl. The dermis was closed with 3-0 Vicryl. I then closed the skin with 5-0 Monocryl. I infiltrated Marcaine throughout this area. I then placed Dermabond and Steri-Strips. She tolerated this well was extubated and transferred to recovery stable.

## 2012-04-30 NOTE — Anesthesia Procedure Notes (Signed)
Procedure Name: LMA Insertion Date/Time: 04/30/2012 11:39 AM Performed by: Angelica Pou Pre-anesthesia Checklist: Patient identified, Emergency Drugs available, Suction available, Patient being monitored and Timeout performed Patient Re-evaluated:Patient Re-evaluated prior to inductionOxygen Delivery Method: Circle system utilized Preoxygenation: Pre-oxygenation with 100% oxygen Intubation Type: IV induction LMA: LMA inserted LMA Size: 4.0 Number of attempts: 1 Placement Confirmation: positive ETCO2 and breath sounds checked- equal and bilateral Tube secured with: Tape Dental Injury: Teeth and Oropharynx as per pre-operative assessment

## 2012-04-30 NOTE — Progress Notes (Signed)
Pacer interrogated by the [employer] rep.  Call her  When procedure is finished... [phone]  [name]) needs to be called.Marland Kitchen

## 2012-04-30 NOTE — Transfer of Care (Signed)
Immediate Anesthesia Transfer of Care Note  Patient: [name]  Procedure(s) Performed: Procedure(s) (LRB) with comments: BREAST BIOPSY WITH NEEDLE LOCALIZATION (Left) - Left breast wire localization biospy  Patient Location: PACU  Anesthesia Type: General  Level of Consciousness: awake, alert , oriented and patient cooperative  Airway & Oxygen Therapy: Patient Spontanous Breathing and Patient connected to nasal cannula oxygen  Post-op Assessment: Report given to PACU RN, Post -op Vital signs reviewed and stable and Patient moving all extremities X 4  Post vital signs: Reviewed and stable  Complications: No apparent anesthesia complications

## 2012-04-30 NOTE — Interval H&P Note (Signed)
History and Physical Interval Note:  04/30/2012 11:24 AM  [name]  has presented today for surgery, with the diagnosis of nipple discharge, Left breast mass  The various methods of treatment have been discussed with the patient and family. After consideration of risks, benefits and other options for treatment, the patient has consented to  Procedure(s) (LRB) with comments: BREAST BIOPSY WITH NEEDLE LOCALIZATION (Left) - Left breast wire localization biospy as a surgical intervention .  The patient's history has been reviewed, patient examined, no change in status, stable for surgery.  I have reviewed the patient's chart and labs.  Questions were answered to the patient's satisfaction.     [name]

## 2012-04-30 NOTE — Preoperative (Signed)
Beta Blockers   Reason not to administer Beta Blockers:Not Applicable 

## 2012-04-30 NOTE — Anesthesia Postprocedure Evaluation (Signed)
  Anesthesia Post-op Note  Patient: [name]  Procedure(s) Performed: Procedure(s) (LRB) with comments: BREAST BIOPSY WITH NEEDLE LOCALIZATION (Left) - Left breast wire localization biospy  Patient Location: PACU  Anesthesia Type: General  Level of Consciousness: awake, alert , oriented and patient cooperative  Airway and Oxygen Therapy: Patient Spontanous Breathing  Post-op Pain: none  Post-op Assessment: Post-op Vital signs reviewed, Patient's Cardiovascular Status Stable, Respiratory Function Stable, Patent Airway, No signs of Nausea or vomiting and Pain level controlled  Post-op Vital Signs: Reviewed and stable  Complications: No apparent anesthesia complications

## 2012-04-30 NOTE — Anesthesia Preprocedure Evaluation (Addendum)
Anesthesia Evaluation  Patient identified by MRN, date of birth, ID band Patient awake    Reviewed: Allergy & Precautions, H&P , NPO status , Patient's Chart, lab work & pertinent test results  History of Anesthesia Complications Negative for: history of anesthetic complications  Airway Mallampati: I TM Distance: >3 FB Neck ROM: Full    Dental  (+) Edentulous Upper and Edentulous Lower   Pulmonary asthma , neg pneumonia -, COPD COPD inhaler, former smoker (quit 30 years),  breath sounds clear to auscultation  Pulmonary exam normal       Cardiovascular hypertension, Pt. 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 device) Rhythm:Regular Rate:Normal  '10 stress test: no ischemia, EF 64%   Neuro/Psych negative neurological ROS     GI/Hepatic negative GI ROS, Neg liver ROS,   Endo/Other  Morbid obesity  Renal/GU Renal disease (patient says only one functioning kidney, normal BUN, creat)     Musculoskeletal   Abdominal (+) + obese,   Peds  Hematology   Anesthesia Other Findings   Reproductive/Obstetrics                         Anesthesia Physical Anesthesia Plan  ASA: III  Anesthesia Plan: General   Post-op Pain Management:    Induction: Intravenous  Airway Management Planned: LMA  Additional Equipment:   Intra-op Plan:   Post-operative Plan:   Informed Consent: I have reviewed the patients History and Physical, chart, labs and discussed the procedure including the risks, benefits and alternatives for the proposed anesthesia with the patient or authorized representative who has indicated his/her understanding and acceptance.     Plan Discussed with: CRNA and Surgeon  Anesthesia Plan Comments: (Plan routine monitors, GA- LMA OK)        Anesthesia Quick Evaluation

## 2012-04-30 NOTE — Progress Notes (Signed)
Rep from [employer] here to restart pacemaker

## 2012-04-30 NOTE — Progress Notes (Addendum)
[employer] rep called-Brian Small([phone]) and notified of pt arriving.

## 2012-04-30 NOTE — H&P (View-Only) (Signed)
Patient ID: Lisa Ann J Clutter, female   DOB: 11/17/1940, 71 y.o.   MRN: [id number]  Chief Complaint  Patient presents with  . 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 female.  Referred by Dr. Gretchen Green HPI This is a 71-year-old female with multiple problems has no prior history of any breast complaints. She recently has had left nipple discharge and has been spontaneous it is noted to be torqued and appearing to be old blood. This had been watering about 3-4 months ago and then she had a bloody discharge. She has had occasional clear discharge from the right nipple throughout her entire life. She reports no other complaints referable to her breast today. She does have a family history of a sister in her 60s who developed breast cancer. Past Medical History  Diagnosis Date  . Allergic rhinitis   . Asthma   . PAT (paroxysmal atrial tachycardia) 2008  . 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 Tonsillectomy   . Hemorrhoid surgery   . Tah and bso   . Pacemaker insertion 02-06-2011    No family history on file.  Social History History  Substance Use Topics  . Smoking status: Former Smoker    Quit date: 08/07/1977  . Smokeless tobacco: Not on file  . Alcohol Use: No     quit 1979    Allergies  Allergen Reactions  . Albuterol     REACTION: irregular heartbeat    Current Outpatient Prescriptions  Medication Sig Dispense Refill  . arformoterol (BROVANA) 15 MCG/2ML NEBU Take 15 mcg by nebulization 2 (two) times daily.        . beclomethasone (QVAR) 80 MCG/ACT inhaler Inhale 1 puff into the lungs daily as needed. Rinse mouth       . furosemide (LASIX) 40 MG tablet Take 40 mg by mouth 2 (two) times daily.      . ipratropium (ATROVENT) 0.02 % nebulizer solution Take 500 mcg by nebulization as needed.       . KRILL OIL PO Take 1 capsule by mouth daily.      . levalbuterol (XOPENEX HFA) 45 MCG/ACT  inhaler Inhale 1-2 puffs into the lungs every 4 (four) hours as needed.        . Metoprolol-Hydrochlorothiazide 50-12.5 MG TB24 Take 1 tablet by mouth daily.        . olmesartan (BENICAR) 40 MG tablet Take 20 mg by mouth daily.        Review of Systems Review of Systems  Constitutional: Negative for fever, chills and unexpected weight change.  HENT: Positive for congestion and voice change. Negative for hearing loss, sore throat and trouble swallowing.   Eyes: Negative for visual disturbance.  Respiratory: Positive for cough and wheezing.   Cardiovascular: Positive for palpitations and leg swelling. Negative for chest pain.  Gastrointestinal: Positive for constipation. Negative for nausea, vomiting, abdominal pain, diarrhea, blood in stool, abdominal distention and anal bleeding.  Genitourinary: Negative for hematuria, vaginal bleeding and difficulty urinating.  Musculoskeletal: Positive for arthralgias.  Skin: Negative for rash and wound.  Neurological: Positive for weakness. Negative for seizures, syncope and headaches.  Hematological: Negative for adenopathy. Does not bruise/bleed easily.  Psychiatric/Behavioral: Negative for confusion.    There were no vitals taken for this visit.  Physical Exam Physical Exam  Vitals reviewed.   Constitutional: She appears well-developed and well-nourished.  Neck: Neck supple.  Cardiovascular: Normal rate, regular rhythm and normal heart sounds.   Pulmonary/Chest: Effort normal. She has wheezes (occ expiratory). Right breast exhibits no inverted nipple, no mass, no nipple discharge, no skin change and no tenderness. Left breast exhibits no inverted nipple, no mass, no nipple discharge, no skin change and no tenderness. Breasts are symmetrical.  Lymphadenopathy:    She has no cervical adenopathy.    She has no axillary adenopathy.    Data Reviewed DIGITAL DIAGNOSTIC BILATERAL MAMMOGRAM WITH CAD AND LEFT BREAST  ULTRASOUND:  Comparison: Prior  exams  Findings: The breast parenchyma is predominantly fatty. No  suspicious mass, calcification, or architectural distortion is  seen.  Left-sided pacer in place.  Mammographic images were processed with CAD.  Ultrasound is performed, showing an intraductal 5 mm mass within a  mildly dilated duct in the left breast three o'clock location  subareolar area. On initial ultrasound exam prior to physical  exam, the mass appeared to be in the 12 o'clock area of the left  breast, subareolar location, but then with repositioning the breast  and correlation with physical exam findings, it became clear that  the intraductal mass is in the subareolar left breast three o'clock  location, within the mildly dilated duct.  On physical exam, I am able to elicit brownish single duct left  nipple discharge from the three o'clock location.  IMPRESSION:  Intraductal 5 mm left breast mass 3 o'clock location with  associated duct ectasia and expressible bloody left nipple  discharge. Findings are suspicious for papilloma, papillary  cancer, or DCIS. Inflammation with intraductal material would be  considered much less likely. The lesion is too superficial and  central to the nipple areolar complex for ultrasound-guided core  biopsy. Surgical excision is recommended, and could be preceded by  needle localization under sonographic guidance if needed. Findings  and recommendations discussed with the patient and provided in  written form at the time of the exam. No evidence for malignancy  in the right breast.   Assessment    Bloody left nipple discharge    Plan    We discussed a left breast wire-guided excisional biopsy of this lesion due to the bloody nipple discharge. There is a chance this could be a carcinoma this is unlikely. I am going to have to obtain a perioperative evaluation from cardiology prior to beginning. Once this is done we will plan on getting her scheduled. We discussed the left  breast wire localization biopsy with her as being but not limited to bleeding, infection, further surgeries or cancer is identified.       Leam Madero 04/01/2012, 3:14 PM    

## 2012-05-01 ENCOUNTER — Telehealth (INDEPENDENT_AMBULATORY_CARE_PROVIDER_SITE_OTHER): Payer: Self-pay

## 2012-05-01 ENCOUNTER — Encounter (HOSPITAL_COMMUNITY): Payer: Self-pay | Admitting: General Surgery

## 2012-05-01 MED ORDER — OXYCODONE HCL 5 MG PO TABS: 5.0000 mg | ORAL_TABLET | ORAL | Status: DC | PRN

## 2012-05-01 NOTE — Telephone Encounter (Signed)
LMOM for pt or her daughter to call me so I can give her the good news about her path report being benign per Dr Dwain Sarna.

## 2012-05-01 NOTE — Discharge Summary (Signed)
Physician Discharge Summary  Patient ID: [name] MRN: [id number] DOB/AGE: 03/03/1941 71 y.o.  Admit date: 04/30/2012 Discharge date: 05/01/2012  Admission Diagnoses: PAT Left breast mass Heart Block  Discharge Diagnoses:  Same as above Discharged Condition: good  Hospital Course: 38 yof with multiple medical problems who had a left breast mass and nipple discharge.  This underwent wire guided excision which she tolerated well.  She has done well overnight and will be discharged home.  Consults: None  Significant Diagnostic Studies: none  Treatments: surgery: Left breast wire localized biopsy  Discharge Exam: Blood pressure 121/71, pulse 80, temperature 98.3 F (36.8 C), temperature source Oral, resp. rate 18, height 5\' 6"  (1.676 m), weight 273 lb (123.832 kg), SpO2 95.00%. dressing dry  Disposition: 01-Home or Self Care  Discharge Orders    Future Appointments: Provider: Department: Dept Phone: Center:   07/01/2012 1:15 PM [facility] [phone] [facility]       Medication List     As of 05/01/2012  7:38 AM    TAKE these medications         beclomethasone 80 MCG/ACT inhaler   Commonly known as: QVAR   Inhale 1 puff into the lungs daily as needed. Rinse mouth. For shortness of breath.      BROVANA 15 MCG/2ML Nebu   Generic drug: arformoterol   Take 15 mcg by nebulization 2 (two) times daily.      furosemide 40 MG tablet   Commonly known as: LASIX   Take 40 mg by mouth daily.      ipratropium 0.02 % nebulizer solution   Commonly known as: ATROVENT   Take 500 mcg by nebulization as needed. For shortness of breath.      KRILL OIL PO   Take 1 capsule by mouth daily.      olmesartan 40 MG tablet   Commonly known as: BENICAR   Take 20 mg by mouth daily.      oxyCODONE 5 MG immediate release tablet   Commonly known as: Oxy IR/ROXICODONE   Take 1 tablet (5 mg total) by mouth every 4 (four) hours as needed.     XOPENEX HFA 45 MCG/ACT inhaler   Generic drug: levalbuterol   Inhale 1-2 puffs into the lungs every 4 (four) hours as needed. For shortness of breath.           Follow-up Information    Follow up with [facility], MD. In 3 weeks.   Contact information:   [address] [place] [address] [phone]          Signed: Emelia Loron 05/01/2012, 7:38 AM

## 2012-05-02 NOTE — Telephone Encounter (Signed)
Patient made aware pathology is benign. Follow up appt made. She will call with any questions prior.

## 2012-05-21 ENCOUNTER — Ambulatory Visit (INDEPENDENT_AMBULATORY_CARE_PROVIDER_SITE_OTHER): Payer: Medicare PPO | Admitting: General Surgery

## 2012-05-21 ENCOUNTER — Encounter (INDEPENDENT_AMBULATORY_CARE_PROVIDER_SITE_OTHER): Payer: Self-pay | Admitting: General Surgery

## 2012-05-21 VITALS — BP 138/84 | HR 92 | Temp 98.2°F | Resp 18 | Ht 67.5 in | Wt 272.4 lb

## 2012-05-21 DIAGNOSIS — Z09 Encounter for follow-up examination after completed treatment for conditions other than malignant neoplasm: Secondary | ICD-10-CM

## 2012-05-21 NOTE — Progress Notes (Signed)
Subjective:     Patient ID: [name], female   DOB: 1940/11/14, 71 y.o.   MRN: [id number]  HPI This is a 71 year old female who had a left breast mass and bloody nipple discharge. She underwent a wire localized biopsy. 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 She returns today doing well without any complaints.  Review of Systems NEEDLE LOCALIZATION USING ULTRASOUND GUIDANCE AND SPECIMEN  RADIOGRAPH  Comparison: Previous exams.  Patient presents for needle localization prior to surgical excision  of left breast subareolar mass. I met with the patient and we  discussed the procedure of needle localization including benefits  and alternatives. We discussed the high likelihood of a successful  procedure. We discussed the risks of the procedure, including  infection, bleeding, tissue injury, and further surgery. Informed,  written consent was given.  Using ultrasound guidance, sterile technique, and 2% lidocaine,  the left breast mass located at the 12 o'clock position in a  subareolar location was initially localized and a ribbon shaped  clip was placed along the posterior border of the mass. Following  this, a 7 cm Ultra wire was placed along the posterior aspect of  the mass utilizing a lateral approach. The films are marked for  Dr. Dwain Sarna.  Specimen radiograph is performed at surgery, and confirms the clip,  mass, and intact wire to be present in the tissue sample. The  specimen is marked for pathology.  IMPRESSION:  Needle localization of the subareolar mass located within the left  breast. No apparent complications.      Objective:   Physical Exam Well healed left breast incision without infection    Assessment:     S/p left breast biopsy    Plan:     She has healed well from this. We discussed her pathology as benign today. We discussed continued screening with yearly exams, monthly self exams, and annual mammograms. She will come back and see me as  needed.

## 2012-05-21 NOTE — Patient Instructions (Signed)

## 2012-05-22 ENCOUNTER — Other Ambulatory Visit: Payer: Self-pay | Admitting: Interventional Radiology

## 2012-05-22 DIAGNOSIS — N2889 Other specified disorders of kidney and ureter: Secondary | ICD-10-CM

## 2012-06-11 ENCOUNTER — Ambulatory Visit
Admission: RE | Admit: 2012-06-11 | Discharge: 2012-06-11 | Disposition: A | Discharge status: Home / Self Care | Payer: Medicare HMO | Source: Ambulatory Visit | Attending: Interventional Radiology | Admitting: Interventional Radiology

## 2012-06-11 ENCOUNTER — Ambulatory Visit (HOSPITAL_COMMUNITY)
Admission: RE | Admit: 2012-06-11 | Discharge: 2012-06-11 | Disposition: A | Discharge status: Home / Self Care | Payer: Medicare HMO | Source: Ambulatory Visit | Attending: Interventional Radiology | Admitting: Interventional Radiology

## 2012-06-11 VITALS — BP 148/55 | HR 46 | Temp 97.4°F | Resp 16 | Ht 67.5 in | Wt 271.0 lb

## 2012-06-11 DIAGNOSIS — N2889 Other specified disorders of kidney and ureter: Secondary | ICD-10-CM

## 2012-06-11 DIAGNOSIS — N289 Disorder of kidney and ureter, unspecified: Secondary | ICD-10-CM | POA: Insufficient documentation

## 2012-06-11 DIAGNOSIS — J9819 Other pulmonary collapse: Secondary | ICD-10-CM | POA: Insufficient documentation

## 2012-06-11 DIAGNOSIS — K8020 Calculus of gallbladder without cholecystitis without obstruction: Secondary | ICD-10-CM | POA: Insufficient documentation

## 2012-06-11 DIAGNOSIS — J479 Bronchiectasis, uncomplicated: Secondary | ICD-10-CM | POA: Insufficient documentation

## 2012-06-11 MED ORDER — IOHEXOL 300 MG/ML  SOLN
100.0000 mL | Freq: Once | INTRAMUSCULAR | Status: AC | PRN
  Administered 2012-06-11: 100 mL via INTRAVENOUS

## 2012-06-11 NOTE — Progress Notes (Signed)
Denies hematuria.  Experiences frequent urination, on Lasix 40 mg daily.

## 2012-06-12 NOTE — Progress Notes (Signed)
Patient ID: [name], female   DOB: 14-Jul-1941, 71 y.o.   MRN: [id number]  ESTABLISHED PATIENT OFFICE VISIT  Chief Complaint: Follow-up of complex cystic lesion of right kidney.  History: [name] returns for follow-up of a complex cystic lesion of the right kidney. Follow-up since 2011 has demonstrated a stable multiloculated cystic lesion of the upper pole of the right kidney demonstrating thickened septations.  Review of Systems: No flank pain, hematuria or dysuria. No fever or chills.  Exam: Vital signs: Blood pressure 148/55, pulse 46, respirations 16, temperature 97.4, oxygen saturation 98% on room air. Abdomen: Soft and nontender. No flank tenderness.  Imaging: Follow-up CT was performed earlier today and compared to a prior study on 05/09/2011. This shows a stable multi lobulated and partially calcified cystic lesion emanating from the right kidney. No new lesions are identified.  Assessment and Plan: Stable cystic lesion of the right kidney without growth or appreciable change in morphology since 2011. This is most likely benign. In order to be sure that this does not change over time, I have recommended additional follow-up imaging in 2 years.

## 2012-07-01 ENCOUNTER — Encounter: Payer: Medicare Other | Admitting: *Deleted

## 2012-07-03 ENCOUNTER — Encounter: Payer: Self-pay | Admitting: *Deleted

## 2012-08-29 ENCOUNTER — Encounter: Payer: Self-pay | Admitting: *Deleted

## 2012-08-30 ENCOUNTER — Encounter: Payer: Self-pay | Admitting: *Deleted

## 2012-11-15 ENCOUNTER — Ambulatory Visit (INDEPENDENT_AMBULATORY_CARE_PROVIDER_SITE_OTHER): Payer: Medicare Other | Admitting: Cardiology

## 2012-11-15 VITALS — BP 143/83 | HR 87 | Ht 67.5 in | Wt 275.0 lb

## 2012-11-15 DIAGNOSIS — I471 Supraventricular tachycardia, unspecified: Secondary | ICD-10-CM

## 2012-11-15 DIAGNOSIS — I442 Atrioventricular block, complete: Secondary | ICD-10-CM

## 2012-11-15 DIAGNOSIS — Z950 Presence of cardiac pacemaker: Secondary | ICD-10-CM

## 2012-11-15 LAB — PACEMAKER DEVICE OBSERVATION
AL THRESHOLD: 0.75 V
ATRIAL PACING PM: 34
BAMS-0003: 70 {beats}/min
BATTERY VOLTAGE: 2.93 V
VENTRICULAR PACING PM: 100

## 2012-11-15 NOTE — Patient Instructions (Addendum)
**Note De-Identified [name] Obfuscation** Your physician recommends that you continue on your current medications as directed. Please refer to the Current Medication list given to you today.  Your physician recommends that you schedule a follow-up appointment in: As previously scheduled.

## 2012-11-15 NOTE — Progress Notes (Signed)
Overdue PPM check/device clinic visit. Rescheduled several times due to weather. [name] has no complaints. Normal PPM function. See PaceArt report. Scheduled to see Dr. Ladona Ridgel in July 2014.

## 2012-12-05 ENCOUNTER — Encounter: Payer: Self-pay | Admitting: Internal Medicine

## 2013-04-02 ENCOUNTER — Encounter: Payer: Medicare Other | Admitting: Internal Medicine

## 2013-04-03 ENCOUNTER — Ambulatory Visit (INDEPENDENT_AMBULATORY_CARE_PROVIDER_SITE_OTHER): Payer: Medicare Other | Admitting: *Deleted

## 2013-04-03 ENCOUNTER — Encounter: Payer: Medicare Other | Admitting: *Deleted

## 2013-04-03 DIAGNOSIS — I442 Atrioventricular block, complete: Secondary | ICD-10-CM

## 2013-04-03 DIAGNOSIS — I471 Supraventricular tachycardia: Secondary | ICD-10-CM

## 2013-04-03 DIAGNOSIS — Z950 Presence of cardiac pacemaker: Secondary | ICD-10-CM

## 2013-04-03 LAB — PACEMAKER DEVICE OBSERVATION
AL IMPEDENCE PM: 400 Ohm
AL THRESHOLD: 0.75 V
RV LEAD THRESHOLD: 1 V

## 2013-04-03 NOTE — Progress Notes (Signed)
Pt came today, missed appt yesterday w/ Dr. Ladona Ridgel.   Device check in clinic, all functions normal. Changed V pulse width from 0.59ms to 0.80ms, no other changes made, full details in paceart.  Ms. Elrod wants to see Dr. Ladona Ridgel prior to long trip to see family in [place].  [name] w/ Dr. Ladona Ridgel 07/11/13.

## 2013-05-06 ENCOUNTER — Encounter (INDEPENDENT_AMBULATORY_CARE_PROVIDER_SITE_OTHER): Payer: Self-pay | Admitting: General Surgery

## 2013-05-06 ENCOUNTER — Encounter: Payer: Self-pay | Admitting: Internal Medicine

## 2013-06-30 ENCOUNTER — Other Ambulatory Visit: Payer: Self-pay

## 2013-06-30 ENCOUNTER — Other Ambulatory Visit (HOSPITAL_COMMUNITY): Payer: Self-pay | Admitting: Cardiology

## 2013-06-30 DIAGNOSIS — Z9889 Other specified postprocedural states: Secondary | ICD-10-CM

## 2013-06-30 DIAGNOSIS — Z139 Encounter for screening, unspecified: Secondary | ICD-10-CM

## 2013-06-30 DIAGNOSIS — Z1231 Encounter for screening mammogram for malignant neoplasm of breast: Secondary | ICD-10-CM

## 2013-07-04 ENCOUNTER — Ambulatory Visit
Admission: RE | Admit: 2013-07-04 | Discharge: 2013-07-04 | Disposition: A | Discharge status: Home / Self Care | Payer: Self-pay | Source: Ambulatory Visit

## 2013-07-04 DIAGNOSIS — Z9889 Other specified postprocedural states: Secondary | ICD-10-CM

## 2013-07-04 DIAGNOSIS — Z1231 Encounter for screening mammogram for malignant neoplasm of breast: Secondary | ICD-10-CM

## 2013-07-07 ENCOUNTER — Ambulatory Visit (HOSPITAL_COMMUNITY): Payer: Self-pay

## 2013-07-11 ENCOUNTER — Other Ambulatory Visit: Payer: Self-pay | Admitting: Cardiology

## 2013-07-11 ENCOUNTER — Encounter: Payer: Medicare Other | Admitting: Internal Medicine

## 2013-07-11 DIAGNOSIS — N6452 Nipple discharge: Secondary | ICD-10-CM

## 2013-08-27 ENCOUNTER — Ambulatory Visit
Admission: RE | Admit: 2013-08-27 | Discharge: 2013-08-27 | Disposition: A | Discharge status: Home / Self Care | Payer: Commercial Managed Care - HMO | Source: Ambulatory Visit | Attending: Cardiology | Admitting: Cardiology

## 2013-08-27 DIAGNOSIS — N6452 Nipple discharge: Secondary | ICD-10-CM

## 2014-01-28 ENCOUNTER — Encounter: Payer: Self-pay | Admitting: *Deleted

## 2014-02-19 NOTE — Telephone Encounter (Signed)
Close Encounter 

## 2014-04-07 ENCOUNTER — Ambulatory Visit (INDEPENDENT_AMBULATORY_CARE_PROVIDER_SITE_OTHER): Payer: Commercial Managed Care - HMO | Admitting: Internal Medicine

## 2014-04-07 ENCOUNTER — Encounter: Payer: Self-pay | Admitting: Internal Medicine

## 2014-04-07 ENCOUNTER — Encounter: Payer: Commercial Managed Care - HMO | Admitting: Internal Medicine

## 2014-04-07 VITALS — BP 156/92 | HR 97 | Ht 67.5 in | Wt 280.8 lb

## 2014-04-07 DIAGNOSIS — Z950 Presence of cardiac pacemaker: Secondary | ICD-10-CM | POA: Diagnosis not present

## 2014-04-07 DIAGNOSIS — I471 Supraventricular tachycardia, unspecified: Secondary | ICD-10-CM | POA: Diagnosis not present

## 2014-04-07 DIAGNOSIS — I495 Sick sinus syndrome: Secondary | ICD-10-CM

## 2014-04-07 LAB — MDC_IDC_ENUM_SESS_TYPE_INCLINIC
Implantable Pulse Generator Serial Number: 7254877
Lead Channel Impedance Value: 400 Ohm
Lead Channel Pacing Threshold Amplitude: 0.75 V
Lead Channel Pacing Threshold Amplitude: 1 V
Lead Channel Pacing Threshold Pulse Width: 0.5 ms
Lead Channel Sensing Intrinsic Amplitude: 5.4 mV
Lead Channel Setting Pacing Amplitude: 2.5 V
Lead Channel Setting Pacing Pulse Width: 0.5 ms
Lead Channel Setting Sensing Sensitivity: 4 mV
MDC IDC MSMT BATTERY REMAINING LONGEVITY: 80.4 mo
MDC IDC MSMT BATTERY VOLTAGE: 2.93 V
MDC IDC MSMT LEADCHNL RA IMPEDANCE VALUE: 412.5 Ohm
MDC IDC MSMT LEADCHNL RA PACING THRESHOLD AMPLITUDE: 0.75 V
MDC IDC MSMT LEADCHNL RA PACING THRESHOLD PULSEWIDTH: 0.4 ms
MDC IDC MSMT LEADCHNL RA PACING THRESHOLD PULSEWIDTH: 0.4 ms
MDC IDC MSMT LEADCHNL RA SENSING INTR AMPL: 3.7 mV
MDC IDC MSMT LEADCHNL RV PACING THRESHOLD AMPLITUDE: 1 V
MDC IDC MSMT LEADCHNL RV PACING THRESHOLD PULSEWIDTH: 0.5 ms
MDC IDC SESS DTM: 20150901135648
MDC IDC SET LEADCHNL RA PACING AMPLITUDE: 2 V
MDC IDC STAT BRADY RA PERCENT PACED: 30 %
MDC IDC STAT BRADY RV PERCENT PACED: 99.96 %

## 2014-04-07 MED ORDER — OLMESARTAN MEDOXOMIL 40 MG PO TABS: 20.0000 mg | ORAL_TABLET | Freq: Every day | ORAL | Status: DC

## 2014-04-07 MED ORDER — FUROSEMIDE 40 MG PO TABS: 40.0000 mg | ORAL_TABLET | ORAL | Status: DC

## 2014-04-07 NOTE — Assessment & Plan Note (Signed)
Her [employer] dual-chamber pacemaker is working normally. We'll plan to recheck in several months. 

## 2014-04-07 NOTE — Assessment & Plan Note (Signed)
She is maintaining sinus rhythm for the most part. She has episodes of atrial tachycardia, lasting several minutes at a time, but these are for the most part asymptomatic. She will continue her current medications, and undergo watchful waiting. We are limited by her bronchospasm in terms of medical therapy.

## 2014-04-07 NOTE — Patient Instructions (Signed)
Your physician wants you to follow-up in: 6 months with device clinic and 12 months Dr. Knox Saliva will receive a reminder letter in the mail two months in advance. If you don't receive a letter, please call our office to schedule the follow-up appointment.

## 2014-04-07 NOTE — Addendum Note (Signed)
Addended by: Janan Halter F on: 04/07/2014 02:09 PM   Modules accepted: Orders

## 2014-04-07 NOTE — Progress Notes (Signed)
HPI [name] returns today for followup. She is a very pleasant 73 year old woman with a history of obesity, hypertension, COPD, and symptomatic tachybradycardia syndrome. She is status post permanent pacemaker insertion. The patient denies chest pain or shortness of breath. She does note dietary indiscretion particularly with sodium and has had some problems with peripheral edema. She intermittently uses her Lasix. She has minimal dyspnea. She has been frustrated by her inability to lose weight. She admits to dietary indiscretion. She has a sweet tooth. Allergies  Allergen Reactions  . Albuterol Other (See Comments)    REACTION: irregular heartbeat     Current Outpatient Prescriptions  Medication Sig Dispense Refill  . acetaminophen (TYLENOL) 500 MG tablet Take 500 mg by mouth every 6 (six) hours as needed for moderate pain.      Marland Kitchen arformoterol (BROVANA) 15 MCG/2ML NEBU Take 15 mcg by nebulization 2 (two) times daily.        . beclomethasone (QVAR) 80 MCG/ACT inhaler Inhale 1 puff into the lungs daily as needed. Rinse mouth. For shortness of breath.      . furosemide (LASIX) 40 MG tablet Take 40 mg by mouth 2 (two) times a week.       Marland Kitchen ipratropium (ATROVENT) 0.02 % nebulizer solution Take 500 mcg by nebulization as needed. For shortness of breath.      Marland Kitchen KRILL OIL PO Take 1 capsule by mouth daily.      Marland Kitchen levalbuterol (XOPENEX HFA) 45 MCG/ACT inhaler Inhale 1-2 puffs into the lungs every 4 (four) hours as needed. For shortness of breath.      . olmesartan (BENICAR) 40 MG tablet Take 20 mg by mouth daily.      Marland Kitchen oxyCODONE (OXY IR/ROXICODONE) 5 MG immediate release tablet Take 5 mg by mouth every 4 (four) hours as needed for moderate pain.       No current facility-administered medications for this visit.     Past Medical History  Diagnosis Date  . Allergic rhinitis   . Asthma   . PAT (paroxysmal atrial tachycardia) 2008  . Hyperlipidemia   . CHF (congestive heart failure)     . Emphysema of lung   . Pneumonia     hx  . Pacemaker   . 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 Arthritis   . Anemia     hx due to med    ROS:   All systems reviewed and negative except as noted in the HPI.   Past Surgical History  Procedure Laterality Date  . Tonsillectomy    . Hemorrhoid surgery    . Tah and bso    . Pacemaker insertion  02-06-2011  . Breast biopsy  04/30/2012    Procedure: BREAST BIOPSY WITH NEEDLE LOCALIZATION;  Surgeon: Rolm Bookbinder, MD;  Location: [facility];  Service: General;  Laterality: Left;  Left breast wire localization biospy     Family History  Problem Relation Age of Onset  . Heart disease Mother   . Cancer Sister     breast     History   Social History  . Marital Status: Widowed    Spouse Name: N/A    Number of Children: 2  . Years of Education: N/A   Occupational History  . cna    Social History Main Topics  . Smoking status: Former Smoker    Quit date: 08/07/1977  . Smokeless tobacco: Never Used  .  Alcohol Use: No     Comment: quit 1979  . Drug Use: No  . Sexual Activity: Not on file   Other Topics Concern  . Not on file   Social History Narrative   Pt has 11 siblings in all     BP 156/92  Pulse 97  Ht 5' 7.5" (1.715 m)  Wt 280 lb 12.8 oz (127.37 kg)  BMI 43.31 kg/m2  Physical Exam:  Well appearing 73 year old woman, looking other than her stated age, NAD HEENT: Unremarkable Neck:  7 cm JVD, no thyromegally Back:  No CVA tenderness Lungs:  Clear with no wheezes, rales, or rhonchi. HEART:  Distant, Regular rate rhythm, no murmurs, no rubs, no clicks Abd:  soft, positive bowel sounds, no organomegally, no rebound, no guarding Ext:  2 plus pulses, trace peripheral edema, no cyanosis, no clubbing Skin:  No rashes no nodules Neuro:  CN II through XII intact, motor grossly intact  DEVICE  Normal device function.  See PaceArt for details.   Assess/Plan:

## 2014-04-14 ENCOUNTER — Encounter: Payer: Self-pay | Admitting: Internal Medicine

## 2014-05-19 ENCOUNTER — Other Ambulatory Visit (HOSPITAL_COMMUNITY): Payer: Self-pay | Admitting: Interventional Radiology

## 2014-05-19 ENCOUNTER — Other Ambulatory Visit: Payer: Self-pay | Admitting: Radiology

## 2014-05-19 DIAGNOSIS — N2889 Other specified disorders of kidney and ureter: Secondary | ICD-10-CM

## 2014-06-09 ENCOUNTER — Ambulatory Visit
Admission: RE | Admit: 2014-06-09 | Discharge: 2014-06-09 | Disposition: A | Discharge status: Home / Self Care | Payer: Commercial Managed Care - HMO | Source: Ambulatory Visit | Attending: Interventional Radiology | Admitting: Interventional Radiology

## 2014-06-09 ENCOUNTER — Ambulatory Visit (HOSPITAL_COMMUNITY)
Admission: RE | Admit: 2014-06-09 | Discharge: 2014-06-09 | Disposition: A | Discharge status: Home / Self Care | Payer: Medicare HMO | Source: Ambulatory Visit | Attending: Diagnostic Radiology | Admitting: Diagnostic Radiology

## 2014-06-09 DIAGNOSIS — N2889 Other specified disorders of kidney and ureter: Secondary | ICD-10-CM

## 2014-06-09 DIAGNOSIS — K8020 Calculus of gallbladder without cholecystitis without obstruction: Secondary | ICD-10-CM | POA: Diagnosis not present

## 2014-06-09 MED ORDER — IOHEXOL 300 MG/ML  SOLN
100.0000 mL | Freq: Once | INTRAMUSCULAR | Status: AC | PRN
  Administered 2014-06-09: 100 mL via INTRAVENOUS

## 2014-06-09 NOTE — Progress Notes (Signed)
Chief Complaint: Right renal cystic lesion.  History of Present Illness: [name] is a 73 y.o. 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 The patient's only complaint today is some increase in urinary incontinence which often occurs at night.  Past Medical History  Diagnosis Date  . Allergic rhinitis   . Asthma   . PAT (paroxysmal atrial tachycardia) 2008  . Hyperlipidemia   . CHF (congestive heart failure)   . Emphysema of lung   . Pneumonia     hx  . Pacemaker   . 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 Arthritis   . Anemia     hx due to med    Past Surgical History  Procedure Laterality Date  . Tonsillectomy    . Hemorrhoid surgery    . Tah and bso    . Pacemaker insertion  02-06-2011  . Breast biopsy  04/30/2012    Procedure: BREAST BIOPSY WITH NEEDLE LOCALIZATION;  Surgeon: Rolm Bookbinder, MD;  Location: [place];  Service: General;  Laterality: Left;  Left breast wire localization biospy    Allergies: Albuterol  Medications: Prior to Admission medications   Medication Sig Start Date End Date Taking? Authorizing Provider  arformoterol (BROVANA) 15 MCG/2ML NEBU Take 15 mcg by nebulization 2 (two) times daily.     Yes Historical Provider, MD  beclomethasone (QVAR) 80 MCG/ACT inhaler Inhale 1 puff into the lungs daily as needed. Rinse mouth. For shortness of breath.   Yes Historical Provider, MD  furosemide (LASIX) 40 MG tablet Take 1 tablet (40 mg total) by mouth 2 (two) times a week. 04/07/14  Yes Evans Lance, MD  ipratropium (ATROVENT) 0.02 % nebulizer solution Take 500 mcg by nebulization as needed. For shortness of breath.   Yes Historical Provider, MD  levalbuterol [facility] HFA) 45 MCG/ACT inhaler Inhale 1-2 puffs into the lungs every 4 (four) hours as needed. For shortness of breath.   Yes Historical Provider, MD  acetaminophen (TYLENOL) 500 MG tablet Take 500 mg by  mouth every 6 (six) hours as needed for moderate pain.    Historical Provider, MD  KRILL OIL PO Take 1 capsule by mouth daily.    Historical Provider, MD  olmesartan (BENICAR) 40 MG tablet Take 0.5 tablets (20 mg total) by mouth daily. 04/07/14   Evans Lance, MD  oxyCODONE (OXY IR/ROXICODONE) 5 MG immediate release tablet Take 5 mg by mouth every 4 (four) hours as needed for moderate pain. 05/01/12   Rolm Bookbinder, MD    Family History  Problem Relation Age of Onset  . Heart disease Mother   . Cancer Sister     breast    History   Social History  . Marital Status: Widowed    Spouse Name: N/A    Number of Children: 2  . Years of Education: N/A   Occupational History  . cna    Social History Main Topics  . Smoking status: Former Smoker    Quit date: 08/07/1977  . Smokeless tobacco: Never Used  . Alcohol Use: No     Comment: quit 1979  . Drug Use: No  . Sexual Activity: Not on file   Other Topics Concern  . Not on file   Social History Narrative   Pt has 11 siblings in all    Review of Systems: A 12 point ROS  discussed and pertinent positives are indicated in the HPI above.  All other systems are negative.  Review of Systems  Constitutional: Negative.   Respiratory: Negative.   Cardiovascular: Negative.   Gastrointestinal: Negative.   Genitourinary: Positive for frequency and enuresis. Negative for dysuria, urgency, hematuria, flank pain, difficulty urinating and pelvic pain.  Neurological: Negative.     Vital Signs: BP 186/88 mmHg  Pulse 87  Temp(Src) 97.5 F (36.4 C)  Resp 24  SpO2 92%  Physical Exam  Constitutional: She appears well-developed and well-nourished. No distress.  Abdominal: Soft. She exhibits no distension and no mass. There is no tenderness. There is no rebound and no guarding.  Skin: She is not diaphoretic.    Imaging: Ct Abd Wo & W Cm  06/09/2014   CLINICAL DATA:  Surveillance of right renal mass.Right renal mass N28.89  (ICD-10-CM)  EXAM: CT ABDOMEN WITHOUT AND WITH CONTRAST  TECHNIQUE: Multidetector CT imaging of the abdomen was performed following the standard protocol before and following the bolus administration of intravenous contrast.  CONTRAST:  165mL OMNIPAQUE IOHEXOL 300 MG/ML  SOLN  COMPARISON:  06/11/2012  FINDINGS: Lower chest: Presumed post infectious or inflammatory bronchiectasis at the left lung base. Mild cardiomegaly with dual lead pacer. No pericardial or pleural effusion.  Hepatobiliary: Normal liver. Multiple gallstones, without acute cholecystitis or biliary ductal dilatation.  Pancreas: Normal, without mass or pancreatic ductal dilatation.  Spleen: Normal  Adrenals/Urinary Tract: Normal adrenal glands. Mild upper pole left renal scarring.  Similar size in morphology of a multi cystic process involving the posterior aspect of the upper interpolar right kidney. Peripherally calcified cystic components, without solid enhancement. In greatest transverse dimension, 3.4 x 3.1 cm today on image 48 of series 4. No hydronephrosis.  Stomach/Bowel: Underdistended proximal stomach. Normal abdominal bowel loops.  Vascular/Lymphatic: Normal aorta and branch vessels. Increased number of retroperitoneal nodes, unchanged. The largest node measures 8 mm in the left periaortic space. 10 mm on the prior.  Other:  No ascites.  Musculoskeletal: No acute osseous abnormality.  IMPRESSION: 1. Similar size and morphology of a multicystic process involving the posterior right kidney. Favor localized cystic disease or the sequelae of remote infectious insult. Neoplasm (benign multilocular cystic nephroma) felt much less likely. 2.  No acute abdominal process. 3. Cholelithiasis.   Electronically Signed   By: Abigail Miyamoto M.D.   On: 06/09/2014 15:03    Labs:  CBC: No results for input(s): WBC, HGB, HCT, PLT in the last 8760 hours.  COAGS: No results for input(s): INR, APTT in the last 8760 hours.  BMP: No results for input(s):  NA, K, CL, CO2, GLUCOSE, BUN, CALCIUM, CREATININE, GFRNONAA, GFRAA in the last 8760 hours.  Invalid input(s): CMP  LIVER FUNCTION TESTS: No results for input(s): BILITOT, AST, ALT, ALKPHOS, PROT, ALBUMIN in the last 8760 hours.  TUMOR MARKERS: No results for input(s): AFPTM, CEA, CA199, CHROMGRNA in the last 8760 hours.  Assessment and Plan:  Imaging by CT earlier today demonstrates stability of the peripherally calcified cluster of cysts within the posterior aspect of the right kidney. No enhancing soft tissue is identified. Given that this has been stable for 4 years, I have recommended that we stop routine imaging of the kidneys as the process appears benign and is likely due to prior insult. This may be secondary to the patient's known history of prior urinary tuberculosis. I have recommended imaging of the kidneys only if the patient develops any significant new symptoms. The patient will call  Dr. Janice Norrie to be evaluated for urinary incontinence.  I spent a total of 15 minutes face to face in clinical consultation, greater than 50% of which was counseling/coordinating care for management of the right renal cysts.  Venetia Night. Kathlene Cote, M.D. Pager:  [phone]          Signed: Aletta Edouard T 06/09/2014, 4:06 PM

## 2014-11-03 ENCOUNTER — Other Ambulatory Visit: Payer: Self-pay

## 2014-11-03 DIAGNOSIS — Z1231 Encounter for screening mammogram for malignant neoplasm of breast: Secondary | ICD-10-CM

## 2014-11-04 ENCOUNTER — Other Ambulatory Visit (HOSPITAL_COMMUNITY): Payer: Self-pay | Admitting: Cardiology

## 2014-11-04 DIAGNOSIS — M7989 Other specified soft tissue disorders: Secondary | ICD-10-CM

## 2014-11-04 DIAGNOSIS — M25562 Pain in left knee: Secondary | ICD-10-CM

## 2014-11-05 ENCOUNTER — Encounter: Payer: Self-pay | Admitting: *Deleted

## 2014-11-10 ENCOUNTER — Ambulatory Visit (HOSPITAL_COMMUNITY)
Admission: RE | Admit: 2014-11-10 | Discharge: 2014-11-10 | Disposition: A | Discharge status: Home / Self Care | Payer: Medicare HMO | Source: Ambulatory Visit | Attending: Cardiology | Admitting: Cardiology

## 2014-11-10 DIAGNOSIS — R609 Edema, unspecified: Secondary | ICD-10-CM | POA: Diagnosis not present

## 2014-11-10 DIAGNOSIS — M25562 Pain in left knee: Secondary | ICD-10-CM

## 2014-11-10 DIAGNOSIS — M7989 Other specified soft tissue disorders: Secondary | ICD-10-CM | POA: Diagnosis not present

## 2014-11-10 NOTE — Progress Notes (Signed)
VASCULAR LAB PRELIMINARY  PRELIMINARY  PRELIMINARY  PRELIMINARY  LLEV completed.    Preliminary report:  Negative DVT LLEV and contralateral right groin.  Negative Baker's Cyst left popliteal.  August Albino, RVT 11/10/2014, 3:14 PM

## 2014-11-12 ENCOUNTER — Ambulatory Visit
Admission: RE | Admit: 2014-11-12 | Discharge: 2014-11-12 | Disposition: A | Discharge status: Home / Self Care | Payer: Medicare HMO | Source: Ambulatory Visit

## 2014-11-12 DIAGNOSIS — Z1231 Encounter for screening mammogram for malignant neoplasm of breast: Secondary | ICD-10-CM

## 2014-12-02 ENCOUNTER — Encounter: Payer: Self-pay | Admitting: *Deleted

## 2015-01-02 ENCOUNTER — Emergency Department (HOSPITAL_COMMUNITY): Payer: Medicare HMO

## 2015-01-02 ENCOUNTER — Encounter (HOSPITAL_COMMUNITY): Payer: Self-pay | Admitting: Emergency Medicine

## 2015-01-02 ENCOUNTER — Emergency Department (HOSPITAL_COMMUNITY)
Admission: EM | Admit: 2015-01-02 | Discharge: 2015-01-02 | Disposition: A | Discharge status: Home / Self Care | Payer: Medicare HMO | Source: Home / Self Care | Attending: Emergency Medicine | Admitting: Emergency Medicine

## 2015-01-02 DIAGNOSIS — Z950 Presence of cardiac pacemaker: Secondary | ICD-10-CM

## 2015-01-02 DIAGNOSIS — I10 Essential (primary) hypertension: Secondary | ICD-10-CM | POA: Diagnosis present

## 2015-01-02 DIAGNOSIS — R2243 Localized swelling, mass and lump, lower limb, bilateral: Secondary | ICD-10-CM | POA: Insufficient documentation

## 2015-01-02 DIAGNOSIS — Z888 Allergy status to other drugs, medicaments and biological substances status: Secondary | ICD-10-CM

## 2015-01-02 DIAGNOSIS — E669 Obesity, unspecified: Secondary | ICD-10-CM | POA: Diagnosis present

## 2015-01-02 DIAGNOSIS — E785 Hyperlipidemia, unspecified: Secondary | ICD-10-CM | POA: Diagnosis present

## 2015-01-02 DIAGNOSIS — Z8701 Personal history of pneumonia (recurrent): Secondary | ICD-10-CM

## 2015-01-02 DIAGNOSIS — Z8611 Personal history of tuberculosis: Secondary | ICD-10-CM | POA: Insufficient documentation

## 2015-01-02 DIAGNOSIS — R0602 Shortness of breath: Secondary | ICD-10-CM

## 2015-01-02 DIAGNOSIS — I509 Heart failure, unspecified: Secondary | ICD-10-CM | POA: Diagnosis present

## 2015-01-02 DIAGNOSIS — R739 Hyperglycemia, unspecified: Secondary | ICD-10-CM | POA: Diagnosis present

## 2015-01-02 DIAGNOSIS — Z87891 Personal history of nicotine dependence: Secondary | ICD-10-CM

## 2015-01-02 DIAGNOSIS — Z862 Personal history of diseases of the blood and blood-forming organs and certain disorders involving the immune mechanism: Secondary | ICD-10-CM

## 2015-01-02 DIAGNOSIS — M1990 Unspecified osteoarthritis, unspecified site: Secondary | ICD-10-CM

## 2015-01-02 DIAGNOSIS — Z79899 Other long term (current) drug therapy: Secondary | ICD-10-CM | POA: Insufficient documentation

## 2015-01-02 DIAGNOSIS — E876 Hypokalemia: Secondary | ICD-10-CM | POA: Diagnosis present

## 2015-01-02 DIAGNOSIS — J441 Chronic obstructive pulmonary disease with (acute) exacerbation: Secondary | ICD-10-CM | POA: Insufficient documentation

## 2015-01-02 DIAGNOSIS — J45901 Unspecified asthma with (acute) exacerbation: Principal | ICD-10-CM | POA: Diagnosis present

## 2015-01-02 DIAGNOSIS — J439 Emphysema, unspecified: Secondary | ICD-10-CM | POA: Diagnosis present

## 2015-01-02 DIAGNOSIS — J45909 Unspecified asthma, uncomplicated: Secondary | ICD-10-CM | POA: Diagnosis present

## 2015-01-02 DIAGNOSIS — Z6841 Body Mass Index (BMI) 40.0 and over, adult: Secondary | ICD-10-CM

## 2015-01-02 DIAGNOSIS — D649 Anemia, unspecified: Secondary | ICD-10-CM | POA: Diagnosis present

## 2015-01-02 LAB — COMPREHENSIVE METABOLIC PANEL
ALBUMIN: 3.5 g/dL (ref 3.5–5.0)
ALT: 13 U/L — AB (ref 14–54)
AST: 23 U/L (ref 15–41)
Alkaline Phosphatase: 128 U/L — ABNORMAL HIGH (ref 38–126)
Anion gap: 10 (ref 5–15)
BUN: 10 mg/dL (ref 6–20)
CHLORIDE: 106 mmol/L (ref 101–111)
CO2: 23 mmol/L (ref 22–32)
CREATININE: 0.94 mg/dL (ref 0.44–1.00)
Calcium: 8.7 mg/dL — ABNORMAL LOW (ref 8.9–10.3)
GFR calc Af Amer: >60 mL/min (ref >60)
GFR calc non Af Amer: 57 mL/min — ABNORMAL LOW (ref >60)
GLUCOSE: 126 mg/dL — AB (ref 65–99)
Potassium: 3.7 mmol/L (ref 3.5–5.1)
SODIUM: 139 mmol/L (ref 135–145)
TOTAL PROTEIN: 7.4 g/dL (ref 6.5–8.1)
Total Bilirubin: 1 mg/dL (ref 0.3–1.2)

## 2015-01-02 LAB — CBC WITH DIFFERENTIAL/PLATELET
Basophils Absolute: 0 10*3/uL (ref 0.0–0.1)
Basophils Relative: 0 % (ref 0–1)
Eosinophils Absolute: 0.2 10*3/uL (ref 0.0–0.7)
Eosinophils Relative: 2 % (ref 0–5)
HEMATOCRIT: 39.6 % (ref 36.0–46.0)
HEMOGLOBIN: 12.9 g/dL (ref 12.0–15.0)
LYMPHS PCT: 14 % (ref 12–46)
Lymphs Abs: 1.4 10*3/uL (ref 0.7–4.0)
MCH: 30.4 pg (ref 26.0–34.0)
MCHC: 32.6 g/dL (ref 30.0–36.0)
MCV: 93.2 fL (ref 78.0–100.0)
MONO ABS: 0.8 10*3/uL (ref 0.1–1.0)
MONOS PCT: 8 % (ref 3–12)
NEUTROS ABS: 7.7 10*3/uL (ref 1.7–7.7)
NEUTROS PCT: 76 % (ref 43–77)
PLATELETS: 220 10*3/uL (ref 150–400)
RBC: 4.25 MIL/uL (ref 3.87–5.11)
RDW: 13 % (ref 11.5–15.5)
WBC: 10 10*3/uL (ref 4.0–10.5)

## 2015-01-02 LAB — BRAIN NATRIURETIC PEPTIDE: B NATRIURETIC PEPTIDE 5: 51.3 pg/mL (ref 0.0–100.0)

## 2015-01-02 LAB — I-STAT TROPONIN, ED: TROPONIN I, POC: 0.01 ng/mL (ref 0.00–0.08)

## 2015-01-02 MED ORDER — FUROSEMIDE 10 MG/ML IJ SOLN
40.0000 mg | Freq: Once | INTRAMUSCULAR | Status: AC
  Administered 2015-01-02: 40 mg via INTRAVENOUS
  Filled 2015-01-02: qty 4

## 2015-01-02 MED ORDER — ALBUTEROL SULFATE (2.5 MG/3ML) 0.083% IN NEBU
5.0000 mg | INHALATION_SOLUTION | Freq: Once | RESPIRATORY_TRACT | Status: AC
  Administered 2015-01-02: 5 mg via RESPIRATORY_TRACT
  Filled 2015-01-02: qty 6

## 2015-01-02 NOTE — ED Notes (Signed)
Patient transported to X-ray 

## 2015-01-02 NOTE — ED Provider Notes (Signed)
CSN: [id number]     Arrival date & time 01/02/15  1400 History   First MD Initiated Contact with Patient 01/02/15 1401     Chief Complaint  Patient presents with  . Shortness of Breath    The patient has been having SOB and has been having  a productive cough.  The patient has had yellow sputum.     (Consider location/radiation/quality/duration/timing/severity/associated sxs/prior Treatment) Patient is a 74 y.o. female presenting with shortness of breath. The history is provided by the patient.  Shortness of Breath Severity:  Mild Onset quality:  Gradual Timing:  Constant Progression:  Unchanged Chronicity:  Chronic Context: URI   Relieved by:  Nothing Worsened by:  Nothing tried Ineffective treatments:  None tried Associated symptoms: cough and wheezing   Associated symptoms: no abdominal pain, no chest pain, no fever, no headaches, no neck pain and no vomiting   Cough:    Cough characteristics:  Productive   Sputum characteristics:  [place]   Past Medical History  Diagnosis Date  . Allergic rhinitis   . Asthma   . PAT (paroxysmal atrial tachycardia) 2008  . Hyperlipidemia   . CHF (congestive heart failure)   . Emphysema of lung   . Pneumonia     hx  . Pacemaker   . 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 Arthritis   . Anemia     hx due to med   Past Surgical History  Procedure Laterality Date  . Tonsillectomy    . Hemorrhoid surgery    . Tah and bso    . Pacemaker insertion  02-06-2011  . Breast biopsy  04/30/2012    Procedure: BREAST BIOPSY WITH NEEDLE LOCALIZATION;  Surgeon: Rolm Bookbinder, MD;  Location: [place];  Service: General;  Laterality: Left;  Left breast wire localization biospy   Family History  Problem Relation Age of Onset  . Heart disease Mother   . Cancer Sister     breast   History  Substance Use Topics  . Smoking status: Former Smoker    Quit date: 08/07/1977  . Smokeless tobacco: Never Used  . Alcohol Use: No      Comment: quit 1979   OB History    No data available     Review of Systems  Constitutional: Negative for fever and fatigue.  HENT: Negative for congestion and drooling.   Eyes: Negative for pain.  Respiratory: Positive for cough, shortness of breath and wheezing.   Cardiovascular: Negative for chest pain.  Gastrointestinal: Negative for nausea, vomiting, abdominal pain and diarrhea.  Genitourinary: Negative for dysuria and hematuria.  Musculoskeletal: Negative for back pain, gait problem and neck pain.  Skin: Negative for color change.  Neurological: Negative for dizziness and headaches.  Hematological: Negative for adenopathy.  Psychiatric/Behavioral: Negative for behavioral problems.  All other systems reviewed and are negative.     Allergies  Albuterol  Home Medications   Prior to Admission medications   Medication Sig Start Date End Date Taking? Authorizing Provider  acetaminophen (TYLENOL) 500 MG tablet Take 500 mg by mouth every 6 (six) hours as needed for moderate pain.    Historical Provider, MD  arformoterol (BROVANA) 15 MCG/2ML NEBU Take 15 mcg by nebulization 2 (two) times daily.      Historical Provider, MD  beclomethasone (QVAR) 80 MCG/ACT inhaler Inhale 1 puff into the lungs daily as needed. Rinse mouth. For shortness of breath.    Historical  Provider, MD  furosemide (LASIX) 40 MG tablet Take 1 tablet (40 mg total) by mouth 2 (two) times a week. 04/07/14   Evans Lance, MD  ipratropium (ATROVENT) 0.02 % nebulizer solution Take 500 mcg by nebulization as needed. For shortness of breath.    Historical Provider, MD  KRILL OIL PO Take 1 capsule by mouth daily.    Historical Provider, MD  levalbuterol [facility] HFA) 45 MCG/ACT inhaler Inhale 1-2 puffs into the lungs every 4 (four) hours as needed. For shortness of breath.    Historical Provider, MD  olmesartan (BENICAR) 40 MG tablet Take 0.5 tablets (20 mg total) by mouth daily. 04/07/14   Evans Lance, MD   oxyCODONE (OXY IR/ROXICODONE) 5 MG immediate release tablet Take 5 mg by mouth every 4 (four) hours as needed for moderate pain. 05/01/12   Rolm Bookbinder, MD   BP 128/66 mmHg  Temp(Src) 98.3 F (36.8 C) (Oral)  Resp 20  Ht 5\' 7"  (1.702 m)  Wt 277 lb (125.646 kg)  BMI 43.37 kg/m2  SpO2 95% Physical Exam  Constitutional: She is oriented to person, place, and time. She appears well-developed and well-nourished.  HENT:  Head: Normocephalic.  Mouth/Throat: No oropharyngeal exudate.  Eyes: Conjunctivae and EOM are normal. Pupils are equal, round, and reactive to light.  Neck: Normal range of motion. Neck supple.  Cardiovascular: Normal rate, regular rhythm, normal heart sounds and intact distal pulses.  Exam reveals no gallop and no friction rub.   No murmur heard. Pulmonary/Chest: Effort normal. No respiratory distress. She has wheezes (Mild expiratory wheeze heard bilaterally).  Abdominal: Soft. Bowel sounds are normal. There is no tenderness. There is no rebound and no guarding.  Musculoskeletal: Normal range of motion. She exhibits edema (moderate pitting edema in bilateral lower extremities). She exhibits no tenderness.  Neurological: She is alert and oriented to person, place, and time.  Skin: Skin is warm and dry.  Psychiatric: She has a normal mood and affect. Her behavior is normal.  Nursing note and vitals reviewed.   ED Course  Procedures (including critical care time) Labs Review Labs Reviewed  COMPREHENSIVE METABOLIC PANEL - Abnormal; Notable for the following:    Glucose, Bld 126 (*)    Calcium 8.7 (*)    ALT 13 (*)    Alkaline Phosphatase 128 (*)    GFR calc non Af Amer 57 (*)    All other components within normal limits  CBC WITH DIFFERENTIAL/PLATELET  BRAIN NATRIURETIC PEPTIDE  I-STAT TROPOININ, ED    Imaging Review Dg Chest 2 View  01/02/2015   CLINICAL DATA:  The patient has been having SOB and has been having a productive cough. The patient has had  yellow sputum. The patient said she slept with the ceiling fan on and she thinks that is what caused her to get sick. She denies nausea, vomiting or any other symptoms.  EXAM: CHEST  2 VIEW  COMPARISON:  03/04/2012  FINDINGS: Cardiac silhouette normal in size. No mediastinal or hilar masses. No evidence of adenopathy.  There is evidence of medial lower lobe bronchial wall thickening, more evident on the left, similar to the prior exam. This is likely all chronic given the stability. A component of acute bronchitis is possible. No evidence of pneumonia. No pulmonary edema. No pleural effusion or pneumothorax.  Left anterior chest wall sequential pacemaker is well positioned and stable. Bony thorax is grossly intact.  IMPRESSION: 1. Medial lower lobe bronchial wall thickening which may all be  chronic. Consider acute bronchitis given the history. 2. No evidence of pneumonia or pulmonary edema.   Electronically Signed   By: Lajean Manes M.D.   On: 01/02/2015 15:03     EKG Interpretation   Date/Time:  Saturday Jan 02 2015 14:08:04 EDT Ventricular Rate:  85 PR Interval:  193 QRS Duration: 137 QT Interval:  435 QTC Calculation: 517 R Axis:   24 Text Interpretation:  Unknown rhythm, irregular rate Left bundle branch  block No significant change since last tracing Confirmed by Avri Paiva  MD,  Zoraida Havrilla ([id number]) on 01/02/2015 2:12:26 PM      MDM   Final diagnoses:  SOB (shortness of breath)    2:14 PM 74 y.o. female obesity, hypertension, COPD, tachybradycardia syndrome s/p pacemaker, chf, emphysema who presents with worsening shortness of breath today. She also notes cough productive of brownish sputum. She denies any fevers or chest pain. She has edema in her lower extremities which is not significantly changed from baseline. She does have some mild wheezing on exam. Vital signs otherwise unremarkable. Get screening labs and imaging.  4:06 PM: Pt continues to appear well. Not hypoxic. Workup  noncontributory. Will give an extra dose of Lasix due to the edema in her lower extremities. Do not think this is PE given normal vital signs and lack of chest pain. I have discussed the diagnosis/risks/treatment options with the patient and believe the pt to be eligible for discharge home to follow-up with her pcp in 2-3 days if no better. We also discussed returning to the ED immediately if new or worsening sx occur. We discussed the sx which are most concerning (e.g., worsening sob, cp, fever) that necessitate immediate return. Medications administered to the patient during their visit and any new prescriptions provided to the patient are listed below.  Medications given during this visit Medications  albuterol (PROVENTIL) (2.5 MG/3ML) 0.083% nebulizer solution 5 mg (5 mg Nebulization Given 01/02/15 1427)  furosemide (LASIX) injection 40 mg (40 mg Intravenous Given 01/02/15 1602)    New Prescriptions   No medications on file       Pamella Pert, MD 01/02/15 1607

## 2015-01-02 NOTE — Discharge Instructions (Signed)
Shortness of Breath °Shortness of breath means you have trouble breathing. Shortness of breath needs medical care right away. °HOME CARE  °· Do not smoke. °· Avoid being around chemicals or things (paint fumes, dust) that may bother your breathing. °· Rest as needed. Slowly begin your normal activities. °· Only take medicines as told by your doctor. °· Keep all doctor visits as told. °GET HELP RIGHT AWAY IF:  °· Your shortness of breath gets worse. °· You feel lightheaded, pass out (faint), or have a cough that is not helped by medicine. °· You cough up blood. °· You have pain with breathing. °· You have pain in your chest, arms, shoulders, or belly (abdomen). °· You have a fever. °· You cannot walk up stairs or exercise the way you normally do. °· You do not get better in the time expected. °· You have a hard time doing normal activities even with rest. °· You have problems with your medicines. °· You have any new symptoms. °MAKE SURE YOU: °· Understand these instructions. °· Will watch your condition. °· Will get help right away if you are not doing well or get worse. °Document Released: 01/10/2008 Document Revised: 07/29/2013 Document Reviewed: 10/09/2011 °Exit[employer]® Patient Information ©2015 [employer]. This information is not intended to replace advice given to you by your health care provider. Make sure you discuss any questions you have with your health care provider. ° °

## 2015-01-02 NOTE — ED Notes (Signed)
MD at bedside. 

## 2015-01-02 NOTE — ED Notes (Signed)
The patient has been having SOB and has been having  a productive cough.  The patient has had yellow sputum.  The patient said she slept with the ceiling fan on and she thinks that is what caused her to get sick.  She denies nausea, vomiting or any other symptoms.

## 2015-01-04 ENCOUNTER — Inpatient Hospital Stay (HOSPITAL_COMMUNITY)
Admission: EM | Admit: 2015-01-04 | Discharge: 2015-01-11 | DRG: 202 | Disposition: A | Discharge status: Home / Self Care | Payer: Medicare HMO | Attending: Cardiovascular Disease | Admitting: Cardiovascular Disease

## 2015-01-04 ENCOUNTER — Emergency Department (HOSPITAL_COMMUNITY): Payer: Medicare HMO

## 2015-01-04 ENCOUNTER — Encounter (HOSPITAL_COMMUNITY): Payer: Self-pay | Admitting: *Deleted

## 2015-01-04 DIAGNOSIS — J45909 Unspecified asthma, uncomplicated: Secondary | ICD-10-CM | POA: Diagnosis present

## 2015-01-04 DIAGNOSIS — Z6841 Body Mass Index (BMI) 40.0 and over, adult: Secondary | ICD-10-CM | POA: Diagnosis not present

## 2015-01-04 DIAGNOSIS — E785 Hyperlipidemia, unspecified: Secondary | ICD-10-CM | POA: Diagnosis present

## 2015-01-04 DIAGNOSIS — I10 Essential (primary) hypertension: Secondary | ICD-10-CM | POA: Diagnosis present

## 2015-01-04 DIAGNOSIS — Z950 Presence of cardiac pacemaker: Secondary | ICD-10-CM | POA: Diagnosis not present

## 2015-01-04 DIAGNOSIS — Z8701 Personal history of pneumonia (recurrent): Secondary | ICD-10-CM | POA: Diagnosis not present

## 2015-01-04 DIAGNOSIS — E876 Hypokalemia: Secondary | ICD-10-CM | POA: Diagnosis present

## 2015-01-04 DIAGNOSIS — I509 Heart failure, unspecified: Secondary | ICD-10-CM | POA: Diagnosis present

## 2015-01-04 DIAGNOSIS — Z888 Allergy status to other drugs, medicaments and biological substances status: Secondary | ICD-10-CM | POA: Diagnosis not present

## 2015-01-04 DIAGNOSIS — J441 Chronic obstructive pulmonary disease with (acute) exacerbation: Secondary | ICD-10-CM | POA: Diagnosis present

## 2015-01-04 DIAGNOSIS — Z87891 Personal history of nicotine dependence: Secondary | ICD-10-CM | POA: Diagnosis not present

## 2015-01-04 DIAGNOSIS — I5032 Chronic diastolic (congestive) heart failure: Secondary | ICD-10-CM

## 2015-01-04 DIAGNOSIS — R2243 Localized swelling, mass and lump, lower limb, bilateral: Secondary | ICD-10-CM | POA: Diagnosis present

## 2015-01-04 DIAGNOSIS — Z862 Personal history of diseases of the blood and blood-forming organs and certain disorders involving the immune mechanism: Secondary | ICD-10-CM | POA: Diagnosis not present

## 2015-01-04 DIAGNOSIS — Z79899 Other long term (current) drug therapy: Secondary | ICD-10-CM | POA: Diagnosis not present

## 2015-01-04 DIAGNOSIS — R0602 Shortness of breath: Secondary | ICD-10-CM | POA: Diagnosis present

## 2015-01-04 DIAGNOSIS — J45901 Unspecified asthma with (acute) exacerbation: Secondary | ICD-10-CM | POA: Diagnosis present

## 2015-01-04 DIAGNOSIS — D649 Anemia, unspecified: Secondary | ICD-10-CM | POA: Diagnosis present

## 2015-01-04 DIAGNOSIS — M1990 Unspecified osteoarthritis, unspecified site: Secondary | ICD-10-CM | POA: Diagnosis present

## 2015-01-04 DIAGNOSIS — E669 Obesity, unspecified: Secondary | ICD-10-CM | POA: Diagnosis present

## 2015-01-04 DIAGNOSIS — R739 Hyperglycemia, unspecified: Secondary | ICD-10-CM | POA: Diagnosis present

## 2015-01-04 DIAGNOSIS — I2510 Atherosclerotic heart disease of native coronary artery without angina pectoris: Secondary | ICD-10-CM

## 2015-01-04 DIAGNOSIS — Z8611 Personal history of tuberculosis: Secondary | ICD-10-CM | POA: Diagnosis not present

## 2015-01-04 DIAGNOSIS — I25119 Atherosclerotic heart disease of native coronary artery with unspecified angina pectoris: Secondary | ICD-10-CM

## 2015-01-04 DIAGNOSIS — J439 Emphysema, unspecified: Secondary | ICD-10-CM | POA: Diagnosis present

## 2015-01-04 HISTORY — DX: Essential (primary) hypertension: I10

## 2015-01-04 LAB — COMPREHENSIVE METABOLIC PANEL
ALT: 16 U/L (ref 14–54)
AST: 27 U/L (ref 15–41)
Albumin: 3.3 g/dL — ABNORMAL LOW (ref 3.5–5.0)
Alkaline Phosphatase: 113 U/L (ref 38–126)
Anion gap: 4 — ABNORMAL LOW (ref 5–15)
BILIRUBIN TOTAL: 0.6 mg/dL (ref 0.3–1.2)
BUN: 15 mg/dL (ref 6–20)
CALCIUM: 8.4 mg/dL — AB (ref 8.9–10.3)
CHLORIDE: 108 mmol/L (ref 101–111)
CO2: 27 mmol/L (ref 22–32)
Creatinine, Ser: 0.96 mg/dL (ref 0.44–1.00)
GFR calc Af Amer: >60 mL/min (ref >60)
GFR, EST NON AFRICAN AMERICAN: 56 mL/min — AB (ref >60)
Glucose, Bld: 115 mg/dL — ABNORMAL HIGH (ref 65–99)
Potassium: 3.2 mmol/L — ABNORMAL LOW (ref 3.5–5.1)
Sodium: 139 mmol/L (ref 135–145)
TOTAL PROTEIN: 6.6 g/dL (ref 6.5–8.1)

## 2015-01-04 LAB — CBC
HEMATOCRIT: 39.4 % (ref 36.0–46.0)
Hemoglobin: 12.8 g/dL (ref 12.0–15.0)
MCH: 30.5 pg (ref 26.0–34.0)
MCHC: 32.5 g/dL (ref 30.0–36.0)
MCV: 93.8 fL (ref 78.0–100.0)
Platelets: 194 10*3/uL (ref 150–400)
RBC: 4.2 MIL/uL (ref 3.87–5.11)
RDW: 12.9 % (ref 11.5–15.5)
WBC: 6.1 10*3/uL (ref 4.0–10.5)

## 2015-01-04 LAB — TROPONIN I: Troponin I: <0.03 ng/mL (ref <0.031)

## 2015-01-04 LAB — BRAIN NATRIURETIC PEPTIDE: B NATRIURETIC PEPTIDE 5: 45.7 pg/mL (ref 0.0–100.0)

## 2015-01-04 MED ORDER — LEVALBUTEROL HCL 0.63 MG/3ML IN NEBU
0.6300 mg | INHALATION_SOLUTION | Freq: Three times a day (TID) | RESPIRATORY_TRACT | Status: DC
  Administered 2015-01-04 – 2015-01-09 (×13): 0.63 mg via RESPIRATORY_TRACT
  Filled 2015-01-04 (×32): qty 3

## 2015-01-04 MED ORDER — ONDANSETRON HCL 4 MG/2ML IJ SOLN: 4.0000 mg | Freq: Four times a day (QID) | INTRAMUSCULAR | Status: DC | PRN

## 2015-01-04 MED ORDER — SODIUM CHLORIDE 0.9 % IJ SOLN: 3.0000 mL | INTRAMUSCULAR | Status: DC | PRN

## 2015-01-04 MED ORDER — POTASSIUM CHLORIDE ER 10 MEQ PO TBCR
20.0000 meq | EXTENDED_RELEASE_TABLET | Freq: Two times a day (BID) | ORAL | Status: DC
  Administered 2015-01-04 – 2015-01-11 (×14): 20 meq via ORAL
  Filled 2015-01-04 (×16): qty 2

## 2015-01-04 MED ORDER — DEXTROSE 5 % IV SOLN
1.0000 g | INTRAVENOUS | Status: DC
  Administered 2015-01-04 – 2015-01-10 (×7): 1 g via INTRAVENOUS
  Filled 2015-01-04 (×8): qty 10

## 2015-01-04 MED ORDER — GUAIFENESIN ER 600 MG PO TB12
600.0000 mg | ORAL_TABLET | Freq: Two times a day (BID) | ORAL | Status: DC
  Administered 2015-01-04 – 2015-01-10 (×13): 600 mg via ORAL
  Filled 2015-01-04 (×15): qty 1

## 2015-01-04 MED ORDER — HEPARIN SODIUM (PORCINE) 5000 UNIT/ML IJ SOLN
5000.0000 [IU] | Freq: Three times a day (TID) | INTRAMUSCULAR | Status: DC
  Administered 2015-01-04 – 2015-01-11 (×19): 5000 [IU] via SUBCUTANEOUS
  Filled 2015-01-04 (×22): qty 1

## 2015-01-04 MED ORDER — FUROSEMIDE 10 MG/ML IJ SOLN
40.0000 mg | Freq: Two times a day (BID) | INTRAMUSCULAR | Status: DC
  Administered 2015-01-05: 40 mg via INTRAVENOUS
  Filled 2015-01-04 (×3): qty 4

## 2015-01-04 MED ORDER — SODIUM CHLORIDE 0.9 % IJ SOLN
3.0000 mL | Freq: Two times a day (BID) | INTRAMUSCULAR | Status: DC
  Administered 2015-01-04 – 2015-01-11 (×14): 3 mL via INTRAVENOUS

## 2015-01-04 MED ORDER — ACETAMINOPHEN 325 MG PO TABS: 650.0000 mg | ORAL_TABLET | ORAL | Status: DC | PRN

## 2015-01-04 MED ORDER — SODIUM CHLORIDE 0.9 % IV SOLN: 250.0000 mL | INTRAVENOUS | Status: DC | PRN

## 2015-01-04 NOTE — H&P (Signed)
Referring Physician:  [name] is an 74 y.o. female.                       Chief Complaint: Shortness of breath x 3 days.  HPI: 74 year old black female with asthma has shortness of breath and leg edema. No fever. Some chest tightness. She also has bilateral leg edema x 1 week. She has pacemaker insertion in 02/2011.   Past Medical History  Diagnosis Date  . Allergic rhinitis   . Asthma   . PAT (paroxysmal atrial tachycardia) 2008  . Hyperlipidemia   . CHF (congestive heart failure)   . Emphysema of lung   . Pneumonia     hx  . Pacemaker   . 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 Arthritis   . Anemia     hx due to med      Past Surgical History  Procedure Laterality Date  . Tonsillectomy    . Hemorrhoid surgery    . Tah and bso    . Pacemaker insertion  02-06-2011  . Breast biopsy  04/30/2012    Procedure: BREAST BIOPSY WITH NEEDLE LOCALIZATION;  Surgeon: Rolm Bookbinder, MD;  Location: [place];  Service: General;  Laterality: Left;  Left breast wire localization biospy    Family History  Problem Relation Age of Onset  . Heart disease Mother   . Cancer Sister     breast   Social History:  reports that she quit smoking about 37 years ago. She has never used smokeless tobacco. She reports that she does not drink alcohol or use illicit drugs.  Allergies:  Allergies  Allergen Reactions  . Albuterol Other (See Comments)    irregular heartbeat     (Not in a hospital admission)  Results for orders placed or performed during the hospital encounter of 01/04/15 (from the past 48 hour(s))  CBC     Status: None   Collection Time: 01/04/15  4:20 PM  Result Value Ref Range   WBC 6.1 4.0 - 10.5 K/uL   RBC 4.20 3.87 - 5.11 MIL/uL   Hemoglobin 12.8 12.0 - 15.0 g/dL   HCT 39.4 36.0 - 46.0 %   MCV 93.8 78.0 - 100.0 fL   MCH 30.5 26.0 - 34.0 pg   MCHC 32.5 30.0 - 36.0 g/dL   RDW 12.9 11.5 - 15.5 %   Platelets 194 150 - 400 K/uL   Dg Chest 2  View  01/04/2015   CLINICAL DATA:  Respiratory distress for 1 day.  EXAM: CHEST  2 VIEW  COMPARISON:  01/02/2015.  FINDINGS: Streaky opacity in the medial lower lobes consistent with bronchial wall prominence seen on the prior study is unchanged. There are no areas of lung consolidation. There is no pulmonary edema. There is no pleural effusion or pneumothorax.  Cardiac silhouette is normal in size. No mediastinal or hilar masses.  Left anterior chest wall sequential pacemaker is stable and well positioned.  IMPRESSION: 1. No change from the recent prior study. 2. Streaky medial lower lobe opacity most likely chronic bronchial wall prominence. Acute bronchitis is possible. No evidence of lobar pneumonia. No pulmonary edema.   Electronically Signed   By: Lajean Manes M.D.   On: 01/04/2015 16:43    Review Of Systems Constitutional: Negative for fever, chills and unexpected weight change.  HENT: Positive for congestion and voice change. Negative for hearing  loss, sore throat and trouble swallowing.  Eyes: Negative for visual disturbance.  Respiratory: Positive for cough and wheezing.  Cardiovascular: Positive for palpitations and leg swelling. Negative for chest pain.  Gastrointestinal: Positive for constipation. Negative for nausea, vomiting, abdominal pain, diarrhea, blood in stool, abdominal distention and anal bleeding.  Genitourinary: Negative for hematuria, vaginal bleeding and difficulty urinating.  Musculoskeletal: Positive for arthralgias.  Skin: Negative for rash and wound.  Neurological: Positive for weakness. Negative for seizures, syncope and headaches.  Hematological: Negative for adenopathy. Does not bruise/bleed easily.  Psychiatric/Behavioral: Negative for confusion.   Blood pressure 116/49, pulse 81, temperature 98 F (36.7 C), temperature source Oral, resp. rate 20, SpO2 96 %. Physical Exam   Constitutional: She appears well-developed and well-nourished.  HEENT: [employer],  PERL, EOMI, Conj-pink, Sclera-white. Neck: Neck supple.  Cardiovascular: Normal rate, regular rhythm and normal heart sounds.II/VI systolic murmur.  Pulmonary/Chest: Effort normal. She has wheezes and harsh breath sounds.  Abdomen: Soft and non-tender. Ext:Bil lower leg 2 + edema. CNS: AxOx3. Moves all 4 extremities. Skin:-Warm and dry  Assessment/Plan Shortness of breath and leg edema r/o chf Obesity Hypertension Asthmatic bronchitis  Admit R/o mi IV antibiotics Echocardiogram  Melynda Krzywicki S, MD  01/04/2015, 4:54 PM

## 2015-01-04 NOTE — ED Notes (Signed)
Attempted report 

## 2015-01-04 NOTE — ED Notes (Signed)
Pt in c/o worsening symptoms of her asthma, pt states her air conditioning is off in her house and that it is making her have a hard time breathing there from the heat, symptoms resolved at this time since arrival here, no distress noted, pt seen a few days ago for same

## 2015-01-05 ENCOUNTER — Other Ambulatory Visit (HOSPITAL_COMMUNITY): Payer: Medicare HMO

## 2015-01-05 ENCOUNTER — Inpatient Hospital Stay (HOSPITAL_COMMUNITY): Payer: Medicare HMO

## 2015-01-05 LAB — TROPONIN I: Troponin I: <0.03 ng/mL (ref <0.031)

## 2015-01-05 LAB — BASIC METABOLIC PANEL
Anion gap: 6 (ref 5–15)
BUN: 15 mg/dL (ref 6–20)
CALCIUM: 8.1 mg/dL — AB (ref 8.9–10.3)
CO2: 25 mmol/L (ref 22–32)
Chloride: 108 mmol/L (ref 101–111)
Creatinine, Ser: 0.71 mg/dL (ref 0.44–1.00)
GFR calc Af Amer: >60 mL/min (ref >60)
Glucose, Bld: 94 mg/dL (ref 65–99)
Potassium: 3.6 mmol/L (ref 3.5–5.1)
SODIUM: 139 mmol/L (ref 135–145)

## 2015-01-05 MED ORDER — IRBESARTAN 150 MG PO TABS
150.0000 mg | ORAL_TABLET | Freq: Every day | ORAL | Status: DC
  Administered 2015-01-05 – 2015-01-11 (×7): 150 mg via ORAL
  Filled 2015-01-05 (×7): qty 1

## 2015-01-05 MED ORDER — BUDESONIDE 0.25 MG/2ML IN SUSP
0.2500 mg | Freq: Two times a day (BID) | RESPIRATORY_TRACT | Status: DC
  Administered 2015-01-05 – 2015-01-10 (×9): 0.25 mg via RESPIRATORY_TRACT
  Filled 2015-01-05 (×15): qty 2

## 2015-01-05 MED ORDER — CARVEDILOL 12.5 MG PO TABS
12.5000 mg | ORAL_TABLET | Freq: Two times a day (BID) | ORAL | Status: DC
  Administered 2015-01-05 – 2015-01-11 (×13): 12.5 mg via ORAL
  Filled 2015-01-05 (×16): qty 1

## 2015-01-05 MED ORDER — CARVEDILOL 25 MG PO TABS
25.0000 mg | ORAL_TABLET | Freq: Two times a day (BID) | ORAL | Status: DC
  Administered 2015-01-05: 25 mg via ORAL
  Filled 2015-01-05 (×3): qty 1

## 2015-01-05 MED ORDER — LEVALBUTEROL HCL 0.63 MG/3ML IN NEBU
0.6300 mg | INHALATION_SOLUTION | Freq: Once | RESPIRATORY_TRACT | Status: AC
Start: 2015-01-05 — End: 2015-01-05
  Administered 2015-01-05: 0.63 mg via RESPIRATORY_TRACT

## 2015-01-05 MED ORDER — AMLODIPINE BESYLATE 5 MG PO TABS
5.0000 mg | ORAL_TABLET | Freq: Every day | ORAL | Status: DC
  Administered 2015-01-05: 5 mg via ORAL
  Filled 2015-01-05: qty 1

## 2015-01-05 NOTE — Progress Notes (Signed)
Ref: Patricia Nettle, MD   Subjective:  Breathing better post Xopenex treatment. Afebrile. Normal BNP.  Objective:  Vital Signs in the last 24 hours: Temp:  [97.2 F (36.2 C)-98 F (36.7 C)] 97.8 F (36.6 C) (05/31 0516) Pulse Rate:  [67-81] 80 (05/31 0516) Cardiac Rhythm:  [-] A-V Sequential paced (05/30 2040) Resp:  [18-20] 18 (05/31 0516) BP: (116-150)/(49-74) 135/74 mmHg (05/31 0516) SpO2:  [95 %-99 %] 95 % (05/31 0736) FiO2 (%):  [21 %] 21 % (05/30 1642) Weight:  [124 kg (273 lb 5.9 oz)-124.376 kg (274 lb 3.2 oz)] 124 kg (273 lb 5.9 oz) (05/31 0516)  Physical Exam: BP Readings from Last 1 Encounters:  01/05/15 135/74    Wt Readings from Last 1 Encounters:  01/05/15 124 kg (273 lb 5.9 oz)    Weight change:   HEENT: [place]/AT, Eyes-Brown, PERL, EOMI, Conjunctiva-Pink, Sclera-Non-icteric Neck: No JVD, No bruit, Trachea midline. Lungs:  Clearing, Bilateral. Cardiac:  Regular rhythm, normal S1 and S2, no S3.  Abdomen:  Soft, non-tender. Extremities:  2 + edema present. No cyanosis. No clubbing. CNS: AxOx3, Cranial nerves grossly intact, moves all 4 extremities. Right handed. Skin: Warm and dry.   Intake/Output from previous day: 05/30 0701 - 05/31 0700 In: 787 [P.O.:737; IV Piggyback:50] Out: -     Lab Results: BMET    Component Value Date/Time   NA 139 01/05/2015 0426   NA 139 01/04/2015 1620   NA 139 01/02/2015 1423   K 3.6 01/05/2015 0426   K 3.2* 01/04/2015 1620   K 3.7 01/02/2015 1423   CL 108 01/05/2015 0426   CL 108 01/04/2015 1620   CL 106 01/02/2015 1423   CO2 25 01/05/2015 0426   CO2 27 01/04/2015 1620   CO2 23 01/02/2015 1423   GLUCOSE 94 01/05/2015 0426   GLUCOSE 115* 01/04/2015 1620   GLUCOSE 126* 01/02/2015 1423   BUN 15 01/05/2015 0426   BUN 15 01/04/2015 1620   BUN 10 01/02/2015 1423   CREATININE 0.71 01/05/2015 0426   CREATININE 0.96 01/04/2015 1620   CREATININE 0.94 01/02/2015 1423   CREATININE 1.08 05/05/2011 1536   CALCIUM 8.1*  01/05/2015 0426   CALCIUM 8.4* 01/04/2015 1620   CALCIUM 8.7* 01/02/2015 1423   GFRNONAA >60 01/05/2015 0426   GFRNONAA 56* 01/04/2015 1620   GFRNONAA 57* 01/02/2015 1423   GFRNONAA 50* 05/05/2011 1536   GFRAA >60 01/05/2015 0426   GFRAA >60 01/04/2015 1620   GFRAA >60 01/02/2015 1423   GFRAA 60* 05/05/2011 1536   CBC    Component Value Date/Time   WBC 6.1 01/04/2015 1620   RBC 4.20 01/04/2015 1620   HGB 12.8 01/04/2015 1620   HCT 39.4 01/04/2015 1620   PLT 194 01/04/2015 1620   MCV 93.8 01/04/2015 1620   MCH 30.5 01/04/2015 1620   MCHC 32.5 01/04/2015 1620   RDW 12.9 01/04/2015 1620   LYMPHSABS 1.4 01/02/2015 1423   MONOABS 0.8 01/02/2015 1423   EOSABS 0.2 01/02/2015 1423   BASOSABS 0.0 01/02/2015 1423   HEPATIC Function Panel  Recent Labs  01/02/15 1423 01/04/15 1620  PROT 7.4 6.6   HEMOGLOBIN A1C No components found for: HGA1C,  MPG CARDIAC ENZYMES Lab Results  Component Value Date   TROPONINI <0.03 01/05/2015   TROPONINI <0.03 01/04/2015   TROPONINI <0.03 01/04/2015   BNP No results for input(s): PROBNP in the last 8760 hours. TSH No results for input(s): TSH in the last 8760 hours. CHOLESTEROL No results for input(s):  CHOL in the last 8760 hours.  Scheduled Meds: . amLODipine  5 mg Oral Daily  . budesonide (PULMICORT) nebulizer solution  0.25 mg Nebulization BID  . carvedilol  25 mg Oral BID WC  . cefTRIAXone (ROCEPHIN)  IV  1 g Intravenous Q24H  . furosemide  40 mg Intravenous Q12H  . guaiFENesin  600 mg Oral BID  . heparin  5,000 Units Subcutaneous 3 times per day  . irbesartan  150 mg Oral Daily  . levalbuterol  0.63 mg Nebulization TID  . potassium chloride  20 mEq Oral BID  . sodium chloride  3 mL Intravenous Q12H   Continuous Infusions:  PRN Meds:.sodium chloride, acetaminophen, ondansetron (ZOFRAN) IV, sodium chloride  Assessment/Plan: Shortness of breath Obesity Hypertension Asthmatic bronchitis  Medical treatment. Check  echocardiogram when available     LOS: 1 day    Dixie Dials  MD  01/05/2015, 9:28 AM

## 2015-01-05 NOTE — Progress Notes (Signed)
  Echocardiogram 2D Echocardiogram has been performed.  [name] 01/05/2015, 2:48 PM

## 2015-01-06 ENCOUNTER — Encounter (HOSPITAL_COMMUNITY): Payer: Self-pay | Admitting: General Practice

## 2015-01-06 LAB — BASIC METABOLIC PANEL
Anion gap: 10 (ref 5–15)
BUN: 11 mg/dL (ref 6–20)
CALCIUM: 8.4 mg/dL — AB (ref 8.9–10.3)
CO2: 25 mmol/L (ref 22–32)
Chloride: 104 mmol/L (ref 101–111)
Creatinine, Ser: 0.88 mg/dL (ref 0.44–1.00)
GFR calc Af Amer: >60 mL/min (ref >60)
GLUCOSE: 94 mg/dL (ref 65–99)
Potassium: 3.6 mmol/L (ref 3.5–5.1)
SODIUM: 139 mmol/L (ref 135–145)

## 2015-01-06 NOTE — Progress Notes (Signed)
Heart Failure Navigator Consult Note  Presentation: [name] is a 74 year old black female with asthma has shortness of breath and leg edema. No fever. Some chest tightness. She also has bilateral leg edema x 1 week. She has pacemaker insertion in 02/2011.  Past Medical History  Diagnosis Date  . Allergic rhinitis   . Asthma   . PAT (paroxysmal atrial tachycardia) 2008  . Hyperlipidemia   . CHF (congestive heart failure)   . Emphysema of lung   . Pneumonia     hx  . Pacemaker   . 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 Arthritis   . Anemia     hx due to med  . Hypertension     History   Social History  . Marital Status: Widowed    Spouse Name: N/A  . Number of Children: 2  . Years of Education: N/A   Occupational History  . cna    Social History Main Topics  . Smoking status: Former Smoker    Quit date: 08/07/1977  . Smokeless tobacco: Never Used  . Alcohol Use: No     Comment: quit 1979  . Drug Use: No  . Sexual Activity: Not on file   Other Topics Concern  . None   Social History Narrative   Pt has 11 siblings in all    ECHO:Study Conclusions--01/05/15  - Left ventricle: The cavity size was normal. There was mild concentric hypertrophy. Systolic function was normal. The estimated ejection fraction was in the range of 50% to 55%. Wall motion was normal; there were no regional wall motion abnormalities. Doppler parameters are consistent with abnormal left ventricular relaxation (grade 1 diastolic dysfunction). - Mitral valve: Calcified annulus. - Left atrium: The atrium was mildly dilated.  ------------------------------------------------------------------- Labs, prior tests, procedures, and surgery: Permanent pacemaker system implantation.  Transthoracic echocardiography. M-mode, complete 2D, spectral Doppler, and color Doppler. Birthdate: Patient birthdate: 02/05/1941. Age: Patient is 74 yr old. Sex:  Gender: female. BMI: 42.8 kg/m^2. Blood pressure:   85/42 Patient status: Inpatient. Study date: Study date: 01/05/2015. Study time: 02:00 PM. Location: Bedside. BNP    Component Value Date/Time   BNP 45.7 01/04/2015 1620    ProBNP No results found for: PROBNP   Education Assessment and Provision:  Detailed education and instructions provided on heart failure disease management including the following:  Signs and symptoms of Heart Failure When to call the physician Importance of daily weights Low sodium diet Fluid restriction Medication management Anticipated future follow-up appointments  Patient education given on each of the above topics.  Patient acknowledges understanding and acceptance of all instructions.  I spoke at length with Ms. Bowsher regarding her HF.  She acknowledges that she "has cared for others all her life and has not been caring for herself well".  I have reviewed the importance of daily weights and how they relate to HF signs and symptoms.  She has a scale at home and can weigh daily.  We reviewed low sodium diet and high sodium foods to avoid--she admits that she eats "all the things I shouldn't".  She also admits to not taking her medications on days that she has to go out to appts and such (has been as many as 5 days in a row).  I discouraged her from skipping medications.  She seems motivated and wants to make changes necessary for her health.  Education Materials:  "Living Better  With Heart Failure" Booklet, Daily Weight Tracker Tool    High Risk Criteria for Readmission and/or Poor Patient Outcomes:   EF <30%- No 50-55% with Grade 1 dias dys  2 or more admissions in 6 months- No  Difficult social situation- No  Demonstrates medication noncompliance- No    Barriers of Care:  Knowledge and compliance  Discharge Planning:   Plans to discharge to home alone--Lives in a tri-level.  She would benefit from [facility] for symptom management, ongoing  education and compliance reinforcement.  She could also benefit from HHPT as she lives in a tri-level home and family members are concerned about there ability to navigate the stairs.

## 2015-01-06 NOTE — Progress Notes (Signed)
Talked to patient with family present about DCP; patient stays with spouse, has private insurance with [facility] with prescription drug coverage and does not have any problems getting her medication; PT/OT to eval for home health care. Patient could benefit from a Disease Management program for CHF at discharge; CM to follow; B Pennie Rushing [phone]

## 2015-01-06 NOTE — Progress Notes (Addendum)
Ref: SPRUILL,JEROME O, MD   Subjective:  Blood pressure improves with decreasing medications. No chest pain. Decreasing shortness of breath. Afebrile. On antibiotics.  Objective:  Vital Signs in the last 24 hours: Temp:  [97.3 F (36.3 C)-97.9 F (36.6 C)] 97.6 F (36.4 C) (06/01 1424) Pulse Rate:  [61-69] 61 (06/01 1623) Cardiac Rhythm:  [-] Ventricular paced (06/01 1133) Resp:  [18] 18 (06/01 1424) BP: (99-124)/(50-62) 106/50 mmHg (06/01 1623) SpO2:  [95 %-100 %] 97 % (06/01 1424) FiO2 (%):  [21 %] 21 % (05/31 1926) Weight:  [125.102 kg (275 lb 12.8 oz)] 125.102 kg (275 lb 12.8 oz) (06/01 0541)  Physical Exam: BP Readings from Last 1 Encounters:  01/06/15 106/50    Wt Readings from Last 1 Encounters:  01/06/15 125.102 kg (275 lb 12.8 oz)    Weight change: 0.726 kg (1 lb 9.6 oz)  HEENT: [place]/AT, Eyes-Brown, PERL, EOMI, Conjunctiva-Pink, Sclera-Non-icteric Neck: No JVD, No bruit, Trachea midline. Lungs:  Clear, Bilateral. Cardiac:  Regular rhythm, normal S1 and S2, no S3. II/VI systolic murmur. Abdomen:  Soft, non-tender. Extremities:  1 + edema present. No cyanosis. No clubbing. CNS: AxOx3, Cranial nerves grossly intact, moves all 4 extremities. Right handed. Skin: Warm and dry.   Intake/Output from previous day: 05/31 0701 - 06/01 0700 In: 1230 [P.O.:1180; IV Piggyback:50] Out: 1400 [Urine:1400]    Lab Results: BMET    Component Value Date/Time   NA 139 01/06/2015 0606   NA 139 01/05/2015 0426   NA 139 01/04/2015 1620   K 3.6 01/06/2015 0606   K 3.6 01/05/2015 0426   K 3.2* 01/04/2015 1620   CL 104 01/06/2015 0606   CL 108 01/05/2015 0426   CL 108 01/04/2015 1620   CO2 25 01/06/2015 0606   CO2 25 01/05/2015 0426   CO2 27 01/04/2015 1620   GLUCOSE 94 01/06/2015 0606   GLUCOSE 94 01/05/2015 0426   GLUCOSE 115* 01/04/2015 1620   BUN 11 01/06/2015 0606   BUN 15 01/05/2015 0426   BUN 15 01/04/2015 1620   CREATININE 0.88 01/06/2015 0606   CREATININE 0.71  01/05/2015 0426   CREATININE 0.96 01/04/2015 1620   CREATININE 1.08 05/05/2011 1536   CALCIUM 8.4* 01/06/2015 0606   CALCIUM 8.1* 01/05/2015 0426   CALCIUM 8.4* 01/04/2015 1620   GFRNONAA >60 01/06/2015 0606   GFRNONAA >60 01/05/2015 0426   GFRNONAA 56* 01/04/2015 1620   GFRNONAA 50* 05/05/2011 1536   GFRAA >60 01/06/2015 0606   GFRAA >60 01/05/2015 0426   GFRAA >60 01/04/2015 1620   GFRAA 60* 05/05/2011 1536   CBC    Component Value Date/Time   WBC 6.1 01/04/2015 1620   RBC 4.20 01/04/2015 1620   HGB 12.8 01/04/2015 1620   HCT 39.4 01/04/2015 1620   PLT 194 01/04/2015 1620   MCV 93.8 01/04/2015 1620   MCH 30.5 01/04/2015 1620   MCHC 32.5 01/04/2015 1620   RDW 12.9 01/04/2015 1620   LYMPHSABS 1.4 01/02/2015 1423   MONOABS 0.8 01/02/2015 1423   EOSABS 0.2 01/02/2015 1423   BASOSABS 0.0 01/02/2015 1423   HEPATIC Function Panel  Recent Labs  01/02/15 1423 01/04/15 1620  PROT 7.4 6.6   HEMOGLOBIN A1C No components found for: HGA1C,  MPG CARDIAC ENZYMES Lab Results  Component Value Date   TROPONINI <0.03 01/05/2015   TROPONINI <0.03 01/04/2015   TROPONINI <0.03 01/04/2015   BNP No results for input(s): PROBNP in the last 8760 hours. TSH No results for input(s): TSH in  the last 8760 hours. CHOLESTEROL No results for input(s): CHOL in the last 8760 hours.  Scheduled Meds: . budesonide (PULMICORT) nebulizer solution  0.25 mg Nebulization BID  . carvedilol  12.5 mg Oral BID WC  . cefTRIAXone (ROCEPHIN)  IV  1 g Intravenous Q24H  . guaiFENesin  600 mg Oral BID  . heparin  5,000 Units Subcutaneous 3 times per day  . irbesartan  150 mg Oral Daily  . levalbuterol  0.63 mg Nebulization TID  . potassium chloride  20 mEq Oral BID  . sodium chloride  3 mL Intravenous Q12H   Continuous Infusions:  PRN Meds:.sodium chloride, acetaminophen, ondansetron (ZOFRAN) IV, sodium chloride  Assessment/Plan: Acute asthmatic bronchitis Obesity Hypertension  Consider  nuclear stress test IP or OP. Continue antibiotics.     LOS: 2 days    Dixie Dials  MD  01/06/2015, 6:29 PM

## 2015-01-06 NOTE — Clinical Documentation Improvement (Signed)
Please Specify Type & Acuity of CHF Possible Clinical Conditions?  Chronic Systolic Congestive Heart Failure Chronic Diastolic Congestive Heart Failure Chronic Systolic & Diastolic Congestive Heart Failure Acute Systolic Congestive Heart Failure Acute Diastolic Congestive Heart Failure Acute Systolic & Diastolic Congestive Heart Failure Acute on Chronic Systolic Congestive Heart Failure Acute on Chronic Diastolic Congestive Heart Failure Acute on Chronic Systolic & Diastolic Congestive Heart Failure Other Condition Cannot Clinically Determine  Supporting Information:(As per notes) "Hx of CHF", " R/O CHF"  Diagnostics: ECHO 01-05-15 Study Conclusions - Left ventricle: The cavity size was normal. There was mild concentric hypertrophy. Systolic function was normal. The estimated ejection fraction was in the range of 50% to 55%. Wall motion was normal; there were no regional wall motion abnormalities. Doppler parameters are consistent with abnormal left ventricular relaxation (grade 1 diastolic dysfunction). - Mitral valve: Calcified annulus. - Left atrium: The atrium was mildly &dilated.  Thank You, Alessandra Grout, RN, BSN, CCDS,Clinical Documentation Specialist:  [phone]  [phone]=[employer]- Health Information Management

## 2015-01-06 NOTE — Progress Notes (Signed)
At 1510 introduced  self to pt as incoming nurse till 7p..  Call bell at bedside.  Instructed to call for help  Verbalized understanding. Will continue to monitor.  Karie Kirks, Therapist, sports.

## 2015-01-07 ENCOUNTER — Inpatient Hospital Stay (HOSPITAL_COMMUNITY): Payer: Medicare HMO

## 2015-01-07 LAB — BASIC METABOLIC PANEL
ANION GAP: 9 (ref 5–15)
BUN: 12 mg/dL (ref 6–20)
CHLORIDE: 106 mmol/L (ref 101–111)
CO2: 25 mmol/L (ref 22–32)
Calcium: 8.5 mg/dL — ABNORMAL LOW (ref 8.9–10.3)
Creatinine, Ser: 0.84 mg/dL (ref 0.44–1.00)
GFR calc Af Amer: >60 mL/min (ref >60)
GFR calc non Af Amer: >60 mL/min (ref >60)
GLUCOSE: 90 mg/dL (ref 65–99)
POTASSIUM: 3.9 mmol/L (ref 3.5–5.1)
Sodium: 140 mmol/L (ref 135–145)

## 2015-01-07 MED ORDER — REGADENOSON 0.4 MG/5ML IV SOLN
INTRAVENOUS | Status: AC
  Filled 2015-01-07: qty 5

## 2015-01-07 MED ORDER — REGADENOSON 0.4 MG/5ML IV SOLN
0.4000 mg | Freq: Once | INTRAVENOUS | Status: AC
  Administered 2015-01-07: 0.4 mg via INTRAVENOUS
  Filled 2015-01-07: qty 5

## 2015-01-07 MED ORDER — TECHNETIUM TC 99M SESTAMIBI GENERIC - CARDIOLITE
30.0000 | Freq: Once | INTRAVENOUS | Status: AC | PRN
  Administered 2015-01-07: 30 via INTRAVENOUS

## 2015-01-07 MED ORDER — TECHNETIUM TC 99M SESTAMIBI GENERIC - CARDIOLITE
10.0000 | Freq: Once | INTRAVENOUS | Status: AC | PRN
  Administered 2015-01-07: 10 via INTRAVENOUS

## 2015-01-07 NOTE — Evaluation (Signed)
Occupational Therapy Evaluation Patient Details Name: [name] MRN: [id number] DOB: 09-30-1940 Today's Date: 01/07/2015    History of Present Illness 74 year old black female with asthma has shortness of breath and leg edema. PMHx: pacemaker, CHF   Clinical Impression   Pt is functioning at her baseline in ADL and mobility. Educated pt in use and availability of AE for LB ADL. Educated pt in multiple uses of 3 in 1, energy conservation and benefits of use of 4 wheel walker. No further OT needs.    Follow Up Recommendations  No OT follow up    Equipment Recommendations  3 in 1 bedside comode (bariatric)    Recommendations for Other Services       Precautions / Restrictions Precautions Precautions: None      Mobility Bed Mobility Overal bed mobility: Modified Independent                Transfers Overall transfer level: Modified independent                    Balance Overall balance assessment: No apparent balance deficits (not formally assessed)                                          ADL Overall ADL's : At baseline                                       General ADL Comments: Educated pt in use of reacher, sock aide, long shoe horn and long bath sponge. Pt's goddaughter to get reacher for pt     Vision     Perception     Praxis      Pertinent Vitals/Pain Pain Assessment: No/denies pain     Hand Dominance Right   Extremity/Trunk Assessment Upper Extremity Assessment Upper Extremity Assessment: Overall WFL for tasks assessed   Lower Extremity Assessment Lower Extremity Assessment: Overall WFL for tasks assessed   Cervical / Trunk Assessment Cervical / Trunk Assessment: Normal   Communication Communication Communication: No difficulties   Cognition Arousal/Alertness: Awake/alert Behavior During Therapy: WFL for tasks assessed/performed Overall Cognitive Status: Within Functional Limits for tasks  assessed                     General Comments       Exercises       Shoulder Instructions      Home Living Family/patient expects to be discharged to:: Private residence Living Arrangements: Alone Available Help at Discharge: Family;Friend(s);Available PRN/intermittently Type of Home: House Home Access: Stairs to enter Entrance Stairs-Number of Steps: 3   Home Layout: Multi-level Alternate Level Stairs-Number of Steps: 5       Bathroom Toilet: Standard     Home Equipment: None          Prior Functioning/Environment Level of Independence: Independent        Comments: pt states she has to have children help don socks, wears slip on shoes and has trouble getting to her feet at times    OT Diagnosis:     OT Problem List:     OT Treatment/Interventions:      OT Goals(Current goals can be found in the care plan section)    OT Frequency:     Barriers  to D/C:            Co-evaluation              End of Session    Activity Tolerance: Patient tolerated treatment well Patient left: in chair;with call bell/phone within reach;with family/visitor present   Time: 8307-4600 OT Time Calculation (min): 27 min Charges:  OT General Charges $OT Visit: 1 Procedure OT Evaluation $Initial OT Evaluation Tier I: 1 Procedure OT Treatments $Self Care/Home Management : 8-22 mins G-Codes:    [name] 01/07/2015, 1:09 PM  [phone]

## 2015-01-07 NOTE — Progress Notes (Signed)
Ref: SPRUILL,JEROME O, MD   Subjective:  No reversible ischemia on nuclear stress test with good EF.  Objective:  Vital Signs in the last 24 hours: Temp:  [97.3 F (36.3 C)-97.6 F (36.4 C)] 97.3 F (36.3 C) (06/02 1323) Pulse Rate:  [64-92] 65 (06/02 1323) Cardiac Rhythm:  [-] A-V Sequential paced (06/02 1054) Resp:  [18] 18 (06/02 1323) BP: (116-147)/(46-71) 124/63 mmHg (06/02 1323) SpO2:  [93 %-99 %] 94 % (06/02 1415) Weight:  [128.5 kg (283 lb 4.7 oz)] 128.5 kg (283 lb 4.7 oz) (06/02 0455)  Physical Exam: BP Readings from Last 1 Encounters:  01/07/15 124/63    Wt Readings from Last 1 Encounters:  01/07/15 128.5 kg (283 lb 4.7 oz)    Weight change: 3.398 kg (7 lb 7.9 oz)  HEENT: [place]/AT, Eyes-Brown, PERL, EOMI, Conjunctiva-Pink, Sclera-Non-icteric Neck: No JVD, No bruit, Trachea midline. Lungs:  Clear, Bilateral. Cardiac:  Regular rhythm, normal S1 and S2, no S3.  Abdomen:  Soft, non-tender. Extremities:  No edema present. No cyanosis. No clubbing. CNS: AxOx3, Cranial nerves grossly intact, moves all 4 extremities. Right handed. Skin: Warm and dry.   Intake/Output from previous day: 06/01 0701 - 06/02 0700 In: 1250 [P.O.:1200; IV Piggyback:50] Out: 600 [Urine:600]    Lab Results: BMET    Component Value Date/Time   NA 140 01/07/2015 0618   NA 139 01/06/2015 0606   NA 139 01/05/2015 0426   K 3.9 01/07/2015 0618   K 3.6 01/06/2015 0606   K 3.6 01/05/2015 0426   CL 106 01/07/2015 0618   CL 104 01/06/2015 0606   CL 108 01/05/2015 0426   CO2 25 01/07/2015 0618   CO2 25 01/06/2015 0606   CO2 25 01/05/2015 0426   GLUCOSE 90 01/07/2015 0618   GLUCOSE 94 01/06/2015 0606   GLUCOSE 94 01/05/2015 0426   BUN 12 01/07/2015 0618   BUN 11 01/06/2015 0606   BUN 15 01/05/2015 0426   CREATININE 0.84 01/07/2015 0618   CREATININE 0.88 01/06/2015 0606   CREATININE 0.71 01/05/2015 0426   CREATININE 1.08 05/05/2011 1536   CALCIUM 8.5* 01/07/2015 0618   CALCIUM 8.4*  01/06/2015 0606   CALCIUM 8.1* 01/05/2015 0426   GFRNONAA >60 01/07/2015 0618   GFRNONAA >60 01/06/2015 0606   GFRNONAA >60 01/05/2015 0426   GFRNONAA 50* 05/05/2011 1536   GFRAA >60 01/07/2015 0618   GFRAA >60 01/06/2015 0606   GFRAA >60 01/05/2015 0426   GFRAA 60* 05/05/2011 1536   CBC    Component Value Date/Time   WBC 6.1 01/04/2015 1620   RBC 4.20 01/04/2015 1620   HGB 12.8 01/04/2015 1620   HCT 39.4 01/04/2015 1620   PLT 194 01/04/2015 1620   MCV 93.8 01/04/2015 1620   MCH 30.5 01/04/2015 1620   MCHC 32.5 01/04/2015 1620   RDW 12.9 01/04/2015 1620   LYMPHSABS 1.4 01/02/2015 1423   MONOABS 0.8 01/02/2015 1423   EOSABS 0.2 01/02/2015 1423   BASOSABS 0.0 01/02/2015 1423   HEPATIC Function Panel  Recent Labs  01/02/15 1423 01/04/15 1620  PROT 7.4 6.6   HEMOGLOBIN A1C No components found for: HGA1C,  MPG CARDIAC ENZYMES Lab Results  Component Value Date   TROPONINI <0.03 01/05/2015   TROPONINI <0.03 01/04/2015   TROPONINI <0.03 01/04/2015   BNP No results for input(s): PROBNP in the last 8760 hours. TSH No results for input(s): TSH in the last 8760 hours. CHOLESTEROL No results for input(s): CHOL in the last 8760 hours.  Scheduled  Meds: . budesonide (PULMICORT) nebulizer solution  0.25 mg Nebulization BID  . carvedilol  12.5 mg Oral BID WC  . cefTRIAXone (ROCEPHIN)  IV  1 g Intravenous Q24H  . guaiFENesin  600 mg Oral BID  . heparin  5,000 Units Subcutaneous 3 times per day  . irbesartan  150 mg Oral Daily  . levalbuterol  0.63 mg Nebulization TID  . potassium chloride  20 mEq Oral BID  . sodium chloride  3 mL Intravenous Q12H   Continuous Infusions:  PRN Meds:.sodium chloride, acetaminophen, ondansetron (ZOFRAN) IV, sodium chloride  Assessment/Plan: Acute asthmatic bronchitis Obesity Hypertension  Continue medical therapy.    LOS: 3 days    Dixie Dials  MD  01/07/2015, 6:49 PM

## 2015-01-07 NOTE — Evaluation (Signed)
Physical Therapy Evaluation/Discharge Patient Details Name: [name] MRN: [id number] DOB: February 01, 1941 Today's Date: 01/07/2015   History of Present Illness  74 year old black female with asthma has shortness of breath and leg edema. PMHx: pacemaker, CHF  Clinical Impression  Pt very pleasant and spirited without SOB throughout session. Pt reports decreasing function with being able to reach lower body for several months and that abdominal girth largest impairment. Educated pt for adaptive equipment that may be helpful with OT notified of pt possible need. Pt states trouble getting up and down from toilet at home and fatigue with community ambulation. Encouraged use of rollator for community with education for walking program. All education complete and no further acute P.T. Needs at this time, pt aware and agreeable, will sign off.     Follow Up Recommendations No PT follow up    Equipment Recommendations  3in1 (PT);Rolling walker with 5" wheels (bariatric BSC and rollator for community)    Recommendations for Other Services       Precautions / Restrictions Precautions Precautions: None      Mobility  Bed Mobility Overal bed mobility: Modified Independent                Transfers Overall transfer level: Modified independent                  Ambulation/Gait Ambulation/Gait assistance: Modified independent (Device/Increase time) Ambulation Distance (Feet): 400 Feet Assistive device: None Gait Pattern/deviations: WFL(Within Functional Limits)   Gait velocity interpretation: at or above normal speed for age/gender General Gait Details: pt with steady gait without LOB or SOB, reports community distances are fatiguing  Stairs Stairs: Yes Stairs assistance: Modified independent (Device/Increase time) Stair Management: One rail Right;Alternating pattern;Forwards Number of Stairs: 3    Wheelchair Mobility    Modified Rankin (Stroke Patients Only)        Balance Overall balance assessment: No apparent balance deficits (not formally assessed)                                           Pertinent Vitals/Pain Pain Assessment: No/denies pain  HR 83 sats 93-99% on RA    Home Living Family/patient expects to be discharged to:: Private residence Living Arrangements: Alone Available Help at Discharge: Family;Friend(s);Available PRN/intermittently Type of Home: House Home Access: Stairs to enter   Entrance Stairs-Number of Steps: 3 Home Layout: Multi-level Home Equipment: None      Prior Function Level of Independence: Independent         Comments: pt states she has to have children help don socks, wears slip on shoes and has trouble getting to her feet at times     Hand Dominance        Extremity/Trunk Assessment   Upper Extremity Assessment: Overall WFL for tasks assessed           Lower Extremity Assessment: Overall WFL for tasks assessed      Cervical / Trunk Assessment: Normal  Communication   Communication: No difficulties  Cognition Arousal/Alertness: Awake/alert Behavior During Therapy: WFL for tasks assessed/performed Overall Cognitive Status: Within Functional Limits for tasks assessed                      General Comments      Exercises        Assessment/Plan    PT Assessment Patent  does not need any further PT services  PT Diagnosis Generalized weakness   PT Problem List    PT Treatment Interventions     PT Goals (Current goals can be found in the Care Plan section) Acute Rehab PT Goals PT Goal Formulation: All assessment and education complete, DC therapy    Frequency     Barriers to discharge        Co-evaluation               End of Session   Activity Tolerance: Patient tolerated treatment well Patient left: in chair;with call bell/phone within reach;with family/visitor present Nurse Communication: Mobility status         Time:  1132-1201 PT Time Calculation (min) (ACUTE ONLY): 29 min   Charges:   PT Evaluation $Initial PT Evaluation Tier I: 1 Procedure PT Treatments $Therapeutic Activity: 8-22 mins   PT G CodesMelford Aase 01/07/2015, 12:21 PM Elwyn Reach, [place]

## 2015-01-08 ENCOUNTER — Encounter: Payer: Self-pay | Admitting: *Deleted

## 2015-01-08 LAB — NM MYOCAR MULTI W/SPECT W/WALL MOTION / EF
CHL CUP NUCLEAR SDS: 0
LV dias vol: 73 mL
LV sys vol: 24 mL
Nuc Stress EF: 67 %
RATE: 0.61
Rest HR: 67 {beats}/min
SRS: 6
SSS: 6
TID: 0.98

## 2015-01-08 MED ORDER — METHYLPREDNISOLONE SODIUM SUCC 125 MG IJ SOLR
125.0000 mg | Freq: Every day | INTRAMUSCULAR | Status: AC
  Administered 2015-01-08 – 2015-01-09 (×2): 125 mg via INTRAVENOUS
  Filled 2015-01-08 (×2): qty 2

## 2015-01-08 NOTE — ED Provider Notes (Signed)
Referring Physician:  [name] is an 74 y.o. female.  Chief Complaint: Shortness of breath x 3 days.  HPI: 74 year old black female with asthma has shortness of breath and leg edema. No fever. Some chest tightness. She also has bilateral leg edema x 1 week. She has pacemaker insertion in 02/2011.   Past Medical History  Diagnosis Date  . Allergic rhinitis   . Asthma   . PAT (paroxysmal atrial tachycardia) 2008  . Hyperlipidemia   . CHF (congestive heart failure)   . Emphysema of lung   . Pneumonia     hx  . Pacemaker   . 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 Arthritis   . Anemia     hx due to med      Past Surgical History  Procedure Laterality Date  . Tonsillectomy    . Hemorrhoid surgery    . [place] and bso    . Pacemaker insertion  02-06-2011  . Breast biopsy  04/30/2012    Procedure: BREAST BIOPSY WITH NEEDLE LOCALIZATION; Surgeon: Rolm Bookbinder, MD; Location: [facility]; Service: General; Laterality: Left; Left breast wire localization biospy    Family History  Problem Relation Age of Onset  . Heart disease Mother   . Cancer Sister     breast   Social History:  reports that she quit smoking about 37 years ago. She has never used smokeless tobacco. She reports that she does not drink alcohol or use illicit drugs.  Allergies:  Allergies  Allergen Reactions  . Albuterol Other (See Comments)    irregular heartbeat     (Not in a hospital admission)   Lab Results Last 48 Hours    Results for orders placed or performed during the hospital encounter of 01/04/15 (from the past 48 hour(s))  CBC Status: None   Collection Time: 01/04/15 4:20 PM  Result Value Ref Range   WBC 6.1 4.0 - 10.5 K/uL   RBC 4.20 3.87 - 5.11 MIL/uL   Hemoglobin 12.8 12.0 - 15.0 g/dL   HCT 39.4 36.0 - 46.0 %    MCV 93.8 78.0 - 100.0 fL   MCH 30.5 26.0 - 34.0 pg   MCHC 32.5 30.0 - 36.0 g/dL   RDW 12.9 11.5 - 15.5 %   Platelets 194 150 - 400 K/uL      Imaging Results (Last 48 hours)    Dg Chest 2 View  01/04/2015 CLINICAL DATA: Respiratory distress for 1 day. EXAM: CHEST 2 VIEW COMPARISON: 01/02/2015. FINDINGS: Streaky opacity in the medial lower lobes consistent with bronchial wall prominence seen on the prior study is unchanged. There are no areas of lung consolidation. There is no pulmonary edema. There is no pleural effusion or pneumothorax. Cardiac silhouette is normal in size. No mediastinal or hilar masses. Left anterior chest wall sequential pacemaker is stable and well positioned. IMPRESSION: 1. No change from the recent prior study. 2. Streaky medial lower lobe opacity most likely chronic bronchial wall prominence. Acute bronchitis is possible. No evidence of lobar pneumonia. No pulmonary edema. Electronically Signed By: Lajean Manes M.D. On: 01/04/2015 16:43     Review Of Systems Constitutional: Negative for fever, chills and unexpected weight change.  HENT: Positive for congestion and voice change. Negative for hearing loss, sore throat and trouble swallowing.  Eyes: Negative for visual disturbance.  Respiratory: Positive for cough and wheezing.  Cardiovascular: Positive for palpitations and leg swelling. Negative  for chest pain.  Gastrointestinal: Positive for constipation. Negative for nausea, vomiting, abdominal pain, diarrhea, blood in stool, abdominal distention and anal bleeding.  Genitourinary: Negative for hematuria, vaginal bleeding and difficulty urinating.  Musculoskeletal: Positive for arthralgias.  Skin: Negative for rash and wound.  Neurological: Positive for weakness. Negative for seizures, syncope and headaches.  Hematological: Negative for adenopathy. Does not bruise/bleed easily.  Psychiatric/Behavioral: Negative for  confusion.   Blood pressure 116/49, pulse 81, temperature 98 F (36.7 C), temperature source Oral, resp. rate 20, SpO2 96 %. Physical Exam   Constitutional: She appears well-developed and well-nourished.  HEENT: [employer], PERL, EOMI, Conj-pink, Sclera-white. Neck: Neck supple.  Cardiovascular: Normal rate, regular rhythm and normal heart sounds.II/VI systolic murmur.  Pulmonary/Chest: Effort normal. She has wheezes and harsh breath sounds.  Abdomen: Soft and non-tender. Ext:Bil lower leg 2 + edema. CNS: AxOx3. Moves all 4 extremities. Skin:-Warm and dry  Assessment/Plan Shortness of breath and leg edema r/o chf Obesity Hypertension Asthmatic bronchitis  Admit R/o mi IV antibiotics Echocardiogram  Brigitte Soderberg S, MD  01/04/2015, 4:54 PM

## 2015-01-08 NOTE — Progress Notes (Signed)
Ref: Patricia Nettle, MD   Subjective:  Cough and wheezing continues.  Objective:  Vital Signs in the last 24 hours: Temp:  [97.4 F (36.3 C)-97.8 F (36.6 C)] 97.8 F (36.6 C) (06/03 1453) Pulse Rate:  [60-79] 66 (06/03 1453) Cardiac Rhythm:  [-] A-V Sequential paced (06/03 0739) Resp:  [16] 16 (06/03 1453) BP: (101-126)/(53-62) 113/53 mmHg (06/03 1453) SpO2:  [94 %-100 %] 99 % (06/03 1453) Weight:  [126.145 kg (278 lb 1.6 oz)] 126.145 kg (278 lb 1.6 oz) (06/03 0641)  Physical Exam: BP Readings from Last 1 Encounters:  01/08/15 113/53    Wt Readings from Last 1 Encounters:  01/08/15 126.145 kg (278 lb 1.6 oz)    Weight change: -2.355 kg (-5 lb 3.1 oz)  HEENT: [place]/AT, Eyes-Brown, PERL, EOMI, Conjunctiva-Pink, Sclera-Non-icteric Neck: No JVD, No bruit, Trachea midline. Lungs:  Forced expiratory wheezes, Bilateral. Cardiac:  Regular rhythm, normal S1 and S2, no S3.  Abdomen:  Soft, non-tender. Extremities:  No edema present. No cyanosis. No clubbing. CNS: AxOx3, Cranial nerves grossly intact, moves all 4 extremities. Right handed. Skin: Warm and dry.   Intake/Output from previous day: 06/02 0701 - 06/03 0700 In: 800 [P.O.:800] Out: 1000 [Urine:1000]    Lab Results: BMET    Component Value Date/Time   NA 140 01/07/2015 0618   NA 139 01/06/2015 0606   NA 139 01/05/2015 0426   K 3.9 01/07/2015 0618   K 3.6 01/06/2015 0606   K 3.6 01/05/2015 0426   CL 106 01/07/2015 0618   CL 104 01/06/2015 0606   CL 108 01/05/2015 0426   CO2 25 01/07/2015 0618   CO2 25 01/06/2015 0606   CO2 25 01/05/2015 0426   GLUCOSE 90 01/07/2015 0618   GLUCOSE 94 01/06/2015 0606   GLUCOSE 94 01/05/2015 0426   BUN 12 01/07/2015 0618   BUN 11 01/06/2015 0606   BUN 15 01/05/2015 0426   CREATININE 0.84 01/07/2015 0618   CREATININE 0.88 01/06/2015 0606   CREATININE 0.71 01/05/2015 0426   CREATININE 1.08 05/05/2011 1536   CALCIUM 8.5* 01/07/2015 0618   CALCIUM 8.4* 01/06/2015 0606   CALCIUM 8.1* 01/05/2015 0426   GFRNONAA >60 01/07/2015 0618   GFRNONAA >60 01/06/2015 0606   GFRNONAA >60 01/05/2015 0426   GFRNONAA 50* 05/05/2011 1536   GFRAA >60 01/07/2015 0618   GFRAA >60 01/06/2015 0606   GFRAA >60 01/05/2015 0426   GFRAA 60* 05/05/2011 1536   CBC    Component Value Date/Time   WBC 6.1 01/04/2015 1620   RBC 4.20 01/04/2015 1620   HGB 12.8 01/04/2015 1620   HCT 39.4 01/04/2015 1620   PLT 194 01/04/2015 1620   MCV 93.8 01/04/2015 1620   MCH 30.5 01/04/2015 1620   MCHC 32.5 01/04/2015 1620   RDW 12.9 01/04/2015 1620   LYMPHSABS 1.4 01/02/2015 1423   MONOABS 0.8 01/02/2015 1423   EOSABS 0.2 01/02/2015 1423   BASOSABS 0.0 01/02/2015 1423   HEPATIC Function Panel  Recent Labs  01/02/15 1423 01/04/15 1620  PROT 7.4 6.6   HEMOGLOBIN A1C No components found for: HGA1C,  MPG CARDIAC ENZYMES Lab Results  Component Value Date   TROPONINI <0.03 01/05/2015   TROPONINI <0.03 01/04/2015   TROPONINI <0.03 01/04/2015   BNP No results for input(s): PROBNP in the last 8760 hours. TSH No results for input(s): TSH in the last 8760 hours. CHOLESTEROL No results for input(s): CHOL in the last 8760 hours.  Scheduled Meds: . budesonide (PULMICORT) nebulizer solution  0.25 mg Nebulization BID  . carvedilol  12.5 mg Oral BID WC  . cefTRIAXone (ROCEPHIN)  IV  1 g Intravenous Q24H  . guaiFENesin  600 mg Oral BID  . heparin  5,000 Units Subcutaneous 3 times per day  . irbesartan  150 mg Oral Daily  . levalbuterol  0.63 mg Nebulization TID  . methylPREDNISolone (SOLU-MEDROL) injection  125 mg Intravenous Daily  . potassium chloride  20 mEq Oral BID  . sodium chloride  3 mL Intravenous Q12H   Continuous Infusions:  PRN Meds:.sodium chloride, acetaminophen, ondansetron (ZOFRAN) IV, sodium chloride  Assessment/Plan: Acute asthmatic bronchitis Obesity Hypertension  Add IV solumedrol.     LOS: 4 days    Dixie Dials  MD  01/08/2015, 6:13 PM

## 2015-01-09 MED ORDER — LEVALBUTEROL HCL 0.63 MG/3ML IN NEBU
0.6300 mg | INHALATION_SOLUTION | Freq: Two times a day (BID) | RESPIRATORY_TRACT | Status: DC
  Administered 2015-01-09 – 2015-01-11 (×4): 0.63 mg via RESPIRATORY_TRACT
  Filled 2015-01-09 (×8): qty 3

## 2015-01-09 NOTE — Progress Notes (Signed)
Ref: Patricia Nettle, MD   Subjective:  Feeling better. Decreasing wheezing. Afebrile.  Objective:  Vital Signs in the last 24 hours: Temp:  [97.4 F (36.3 C)-98.1 F (36.7 C)] 97.4 F (36.3 C) (06/04 0440) Pulse Rate:  [65-72] 72 (06/04 0930) Cardiac Rhythm:  [-] A-V Sequential paced (06/03 2000) Resp:  [16-18] 18 (06/04 0440) BP: (109-127)/(52-64) 109/64 mmHg (06/04 0930) SpO2:  [97 %-99 %] 98 % (06/04 1003) Weight:  [126.735 kg (279 lb 6.4 oz)] 126.735 kg (279 lb 6.4 oz) (06/04 0440)  Physical Exam: BP Readings from Last 1 Encounters:  01/09/15 109/64    Wt Readings from Last 1 Encounters:  01/09/15 126.735 kg (279 lb 6.4 oz)    Weight change: 0.59 kg (1 lb 4.8 oz)  HEENT: [place]/AT, Eyes-Brown, PERL, EOMI, Conjunctiva-Pink, Sclera-Non-icteric Neck: No JVD, No bruit, Trachea midline. Lungs:  Mild wheezing, Bilateral. Cardiac:  Regular rhythm, normal S1 and S2, no S3.  Abdomen:  Soft, non-tender. Extremities:  No edema present. No cyanosis. No clubbing. CNS: AxOx3, Cranial nerves grossly intact, moves all 4 extremities. Right handed. Skin: Warm and dry.   Intake/Output from previous day: 06/03 0701 - 06/04 0700 In: 1083 [P.O.:1080; I.V.:3] Out: 600 [Urine:600]    Lab Results: BMET    Component Value Date/Time   NA 140 01/07/2015 0618   NA 139 01/06/2015 0606   NA 139 01/05/2015 0426   K 3.9 01/07/2015 0618   K 3.6 01/06/2015 0606   K 3.6 01/05/2015 0426   CL 106 01/07/2015 0618   CL 104 01/06/2015 0606   CL 108 01/05/2015 0426   CO2 25 01/07/2015 0618   CO2 25 01/06/2015 0606   CO2 25 01/05/2015 0426   GLUCOSE 90 01/07/2015 0618   GLUCOSE 94 01/06/2015 0606   GLUCOSE 94 01/05/2015 0426   BUN 12 01/07/2015 0618   BUN 11 01/06/2015 0606   BUN 15 01/05/2015 0426   CREATININE 0.84 01/07/2015 0618   CREATININE 0.88 01/06/2015 0606   CREATININE 0.71 01/05/2015 0426   CREATININE 1.08 05/05/2011 1536   CALCIUM 8.5* 01/07/2015 0618   CALCIUM 8.4* 01/06/2015  0606   CALCIUM 8.1* 01/05/2015 0426   GFRNONAA >60 01/07/2015 0618   GFRNONAA >60 01/06/2015 0606   GFRNONAA >60 01/05/2015 0426   GFRNONAA 50* 05/05/2011 1536   GFRAA >60 01/07/2015 0618   GFRAA >60 01/06/2015 0606   GFRAA >60 01/05/2015 0426   GFRAA 60* 05/05/2011 1536   CBC    Component Value Date/Time   WBC 6.1 01/04/2015 1620   RBC 4.20 01/04/2015 1620   HGB 12.8 01/04/2015 1620   HCT 39.4 01/04/2015 1620   PLT 194 01/04/2015 1620   MCV 93.8 01/04/2015 1620   MCH 30.5 01/04/2015 1620   MCHC 32.5 01/04/2015 1620   RDW 12.9 01/04/2015 1620   LYMPHSABS 1.4 01/02/2015 1423   MONOABS 0.8 01/02/2015 1423   EOSABS 0.2 01/02/2015 1423   BASOSABS 0.0 01/02/2015 1423   HEPATIC Function Panel  Recent Labs  01/02/15 1423 01/04/15 1620  PROT 7.4 6.6   HEMOGLOBIN A1C No components found for: HGA1C,  MPG CARDIAC ENZYMES Lab Results  Component Value Date   TROPONINI <0.03 01/05/2015   TROPONINI <0.03 01/04/2015   TROPONINI <0.03 01/04/2015   BNP No results for input(s): PROBNP in the last 8760 hours. TSH No results for input(s): TSH in the last 8760 hours. CHOLESTEROL No results for input(s): CHOL in the last 8760 hours.  Scheduled Meds: . budesonide (PULMICORT) nebulizer  solution  0.25 mg Nebulization BID  . carvedilol  12.5 mg Oral BID WC  . cefTRIAXone (ROCEPHIN)  IV  1 g Intravenous Q24H  . guaiFENesin  600 mg Oral BID  . heparin  5,000 Units Subcutaneous 3 times per day  . irbesartan  150 mg Oral Daily  . levalbuterol  0.63 mg Nebulization TID  . potassium chloride  20 mEq Oral BID  . sodium chloride  3 mL Intravenous Q12H   Continuous Infusions:  PRN Meds:.sodium chloride, acetaminophen, ondansetron (ZOFRAN) IV, sodium chloride  Assessment/Plan: Acute asthmatic bronchitis Obesity Hypertension  Increase activity.  Home soon.     LOS: 5 days    Dixie Dials  MD  01/09/2015, 10:23 AM

## 2015-01-10 MED ORDER — PREDNISONE 20 MG PO TABS
20.0000 mg | ORAL_TABLET | Freq: Every day | ORAL | Status: DC
  Administered 2015-01-10 – 2015-01-11 (×2): 20 mg via ORAL
  Filled 2015-01-10 (×3): qty 1

## 2015-01-10 MED ORDER — GUAIFENESIN 100 MG/5ML PO SYRP
100.0000 mg | ORAL_SOLUTION | ORAL | Status: DC | PRN
  Administered 2015-01-10 – 2015-01-11 (×2): 100 mg via ORAL
  Filled 2015-01-10 (×3): qty 5

## 2015-01-10 MED ORDER — FUROSEMIDE 10 MG/ML IJ SOLN
40.0000 mg | Freq: Once | INTRAMUSCULAR | Status: AC
  Administered 2015-01-10: 40 mg via INTRAVENOUS
  Filled 2015-01-10: qty 4

## 2015-01-10 MED ORDER — LEVALBUTEROL HCL 0.63 MG/3ML IN NEBU
0.6300 mg | INHALATION_SOLUTION | Freq: Four times a day (QID) | RESPIRATORY_TRACT | Status: DC | PRN
  Filled 2015-01-10: qty 3

## 2015-01-10 NOTE — Progress Notes (Signed)
Ref: SPRUILL,JEROME O, MD   Subjective:  Cough and leg edema coming back. Afebrile.  Objective:  Vital Signs in the last 24 hours: Temp:  [97.4 F (36.3 C)-97.7 F (36.5 C)] 97.4 F (36.3 C) (06/05 0943) Pulse Rate:  [63-64] 63 (06/05 0943) Cardiac Rhythm:  [-] A-V Sequential paced (06/05 0725) Resp:  [16-18] 18 (06/05 0500) BP: (101-154)/(48-74) 105/48 mmHg (06/05 0943) SpO2:  [96 %-100 %] 97 % (06/05 0943) Weight:  [126.463 kg (278 lb 12.8 oz)] 126.463 kg (278 lb 12.8 oz) (06/05 0500)  Physical Exam: BP Readings from Last 1 Encounters:  01/10/15 105/48    Wt Readings from Last 1 Encounters:  01/10/15 126.463 kg (278 lb 12.8 oz)    Weight change: -0.272 kg (-9.6 oz)  HEENT: [place]/AT, Eyes-Brown, PERL, EOMI, Conjunctiva-Pink, Sclera-Non-icteric Neck: No JVD, No bruit, Trachea midline. Lungs:  Rhonchi, Bilateral. Cardiac:  Regular rhythm, normal S1 and S2, no S3.  Abdomen:  Soft, non-tender. Extremities:  1 + edema of both lower legs present. No cyanosis. No clubbing. CNS: AxOx3, Cranial nerves grossly intact, moves all 4 extremities. Right handed. Skin: Warm and dry.   Intake/Output from previous day: 06/04 0701 - 06/05 0700 In: 1713 [P.O.:1660; I.V.:3; IV Piggyback:50] Out: 1050 [Urine:1050]    Lab Results: BMET    Component Value Date/Time   NA 140 01/07/2015 0618   NA 139 01/06/2015 0606   NA 139 01/05/2015 0426   K 3.9 01/07/2015 0618   K 3.6 01/06/2015 0606   K 3.6 01/05/2015 0426   CL 106 01/07/2015 0618   CL 104 01/06/2015 0606   CL 108 01/05/2015 0426   CO2 25 01/07/2015 0618   CO2 25 01/06/2015 0606   CO2 25 01/05/2015 0426   GLUCOSE 90 01/07/2015 0618   GLUCOSE 94 01/06/2015 0606   GLUCOSE 94 01/05/2015 0426   BUN 12 01/07/2015 0618   BUN 11 01/06/2015 0606   BUN 15 01/05/2015 0426   CREATININE 0.84 01/07/2015 0618   CREATININE 0.88 01/06/2015 0606   CREATININE 0.71 01/05/2015 0426   CREATININE 1.08 05/05/2011 1536   CALCIUM 8.5* 01/07/2015  0618   CALCIUM 8.4* 01/06/2015 0606   CALCIUM 8.1* 01/05/2015 0426   GFRNONAA >60 01/07/2015 0618   GFRNONAA >60 01/06/2015 0606   GFRNONAA >60 01/05/2015 0426   GFRNONAA 50* 05/05/2011 1536   GFRAA >60 01/07/2015 0618   GFRAA >60 01/06/2015 0606   GFRAA >60 01/05/2015 0426   GFRAA 60* 05/05/2011 1536   CBC    Component Value Date/Time   WBC 6.1 01/04/2015 1620   RBC 4.20 01/04/2015 1620   HGB 12.8 01/04/2015 1620   HCT 39.4 01/04/2015 1620   PLT 194 01/04/2015 1620   MCV 93.8 01/04/2015 1620   MCH 30.5 01/04/2015 1620   MCHC 32.5 01/04/2015 1620   RDW 12.9 01/04/2015 1620   LYMPHSABS 1.4 01/02/2015 1423   MONOABS 0.8 01/02/2015 1423   EOSABS 0.2 01/02/2015 1423   BASOSABS 0.0 01/02/2015 1423   HEPATIC Function Panel  Recent Labs  01/02/15 1423 01/04/15 1620  PROT 7.4 6.6   HEMOGLOBIN A1C No components found for: HGA1C,  MPG CARDIAC ENZYMES Lab Results  Component Value Date   TROPONINI <0.03 01/05/2015   TROPONINI <0.03 01/04/2015   TROPONINI <0.03 01/04/2015   BNP No results for input(s): PROBNP in the last 8760 hours. TSH No results for input(s): TSH in the last 8760 hours. CHOLESTEROL No results for input(s): CHOL in the last 8760 hours.  Scheduled Meds: . budesonide (PULMICORT) nebulizer solution  0.25 mg Nebulization BID  . carvedilol  12.5 mg Oral BID WC  . cefTRIAXone (ROCEPHIN)  IV  1 g Intravenous Q24H  . furosemide  40 mg Intravenous Once  . guaiFENesin  600 mg Oral BID  . heparin  5,000 Units Subcutaneous 3 times per day  . irbesartan  150 mg Oral Daily  . levalbuterol  0.63 mg Nebulization BID  . potassium chloride  20 mEq Oral BID  . predniSONE  20 mg Oral QAC breakfast  . sodium chloride  3 mL Intravenous Q12H   Continuous Infusions:  PRN Meds:.sodium chloride, acetaminophen, guaifenesin, levalbuterol, ondansetron (ZOFRAN) IV, sodium chloride  Assessment/Plan: Acute asthmatic bronchitis Obesity Hypertension  Continue medical  treatment.     LOS: 6 days    Dixie Dials  MD  01/10/2015, 2:22 PM

## 2015-01-11 MED ORDER — CARVEDILOL 6.25 MG PO TABS: 6.2500 mg | ORAL_TABLET | Freq: Two times a day (BID) | ORAL | Status: DC

## 2015-01-11 MED ORDER — POTASSIUM CHLORIDE ER 10 MEQ PO TBCR: 10.0000 meq | EXTENDED_RELEASE_TABLET | Freq: Every day | ORAL | Status: DC

## 2015-01-11 MED ORDER — AZITHROMYCIN 250 MG PO TABS: ORAL_TABLET | ORAL | Status: DC

## 2015-01-11 MED ORDER — PREDNISONE 10 MG PO TABS: 20.0000 mg | ORAL_TABLET | Freq: Every day | ORAL | Status: DC

## 2015-01-11 MED ORDER — GUAIFENESIN-DM 100-10 MG/5ML PO SYRP
10.0000 mL | ORAL_SOLUTION | ORAL | Status: DC
  Administered 2015-01-11 (×3): 10 mL via ORAL
  Filled 2015-01-11 (×7): qty 10

## 2015-01-11 MED ORDER — LEVALBUTEROL HCL 0.63 MG/3ML IN NEBU: 0.6300 mg | INHALATION_SOLUTION | Freq: Two times a day (BID) | RESPIRATORY_TRACT | Status: DC

## 2015-01-11 MED ORDER — GUAIFENESIN-DM 100-10 MG/5ML PO SYRP: 10.0000 mL | ORAL_SOLUTION | ORAL | Status: DC

## 2015-01-11 NOTE — Progress Notes (Signed)
Pt has orders to be discharged. Discharge instructions given and pt has no additional questions at this time. Medication regimen reviewed and pt educated. Pt verbalized understanding and has no additional questions. Telemetry box removed. IV removed and site in good condition. Pt stable and waiting for transportation.   Jacquline Terrill RN 

## 2015-01-11 NOTE — Discharge Summary (Signed)
Physician Discharge Summary  Patient ID: [name] MRN: [id number] DOB/AGE: 02-16-1941 74 y.o.  Admit date: 01/04/2015 Discharge date: 01/11/2015  Admission Diagnoses: Shortness of breath and leg edema r/o chf Obesity Hypertension Asthmatic bronchitis  Discharge Diagnoses:  Principle Problem: * Acute asthmatic bronchitis * Obesity Hypertension Hypokalemia-resolved S/P Permanent pacemaker  Discharged Condition: fair  Hospital Course: 74 year old black female with asthma has shortness of breath and leg edema. No fever. Some chest tightness. She also has bilateral leg edema x 1 week. She has pacemaker insertion in 02/2011. She had Chest x-ray negative for pulmonary edema and her BNP was low at 45.7 pg/mL. Her echocardiogram had no wall motion abnormality with EF of 50 %. Her leg swelling improved with intermittent lasix use. Her respiratory function improved with antibiotic and bronchodilator along with solumedrol use. She was discharged home in stable condition with discontinuation of most anti-hypertensive medications and decrease of Coreg to 6.25 mg. twice daily. Her hyperglycemia resolved with diet. She will be followed by Dr. Montez Morita in 2 weeks and by me as needed.  Consults: cardiology  Significant Diagnostic Studies: labs: Near normal BMET post potassium supplementation. Normal CBC and Troponin-I.  Chest x-ray-Bronchitis otherwise unremarkable.  EKG-V paced rhythm with LBBB.  Echocardiogram with preserved LV systolic function and mild diastolic dysfunction.  Treatments: antibiotics: ceftriaxone and azithromycin and cardiac meds: carvedilol, furosemide and potassium.   Discharge Exam: Blood pressure 101/67, pulse 64, temperature 97.5 F (36.4 C), temperature source Oral, resp. rate 18, height 5\' 7"  (1.702 m), weight 124.467 kg (274 lb 6.4 oz), SpO2 97 %. HEENT: [place]/AT, Eyes-Brown, PERL, EOMI, Conjunctiva-Pink, Sclera-Non-icteric Neck: No JVD, No bruit, Trachea  midline. Lungs: Rhonchi, Bilateral. Cardiac: Regular rhythm, normal S1 and S2, no S3.  Abdomen: Soft, non-tender. Extremities: Trace edema of both lower legs present. No cyanosis. No clubbing. CNS: AxOx3, Cranial nerves grossly intact, moves all 4 extremities. Right handed. Skin: Warm and dry.  Disposition: 01-Home or Self Care     Medication List    STOP taking these medications        amLODipine 5 MG tablet  Commonly known as:  NORVASC     beclomethasone 80 MCG/ACT inhaler  Commonly known as:  QVAR     BROVANA 15 MCG/2ML Nebu  Generic drug:  arformoterol      TAKE these medications        azithromycin 250 MG tablet  Commonly known as:  ZITHROMAX Z-PAK  2 tablet day 1 then one daily x 4 days.     carvedilol 6.25 MG tablet  Commonly known as:  COREG  Take 1 tablet (6.25 mg total) by mouth 2 (two) times daily with a meal.     furosemide 40 MG tablet  Commonly known as:  LASIX  Take 1 tablet (40 mg total) by mouth 2 (two) times a week.     guaiFENesin-dextromethorphan 100-10 MG/5ML syrup  Commonly known as:  ROBITUSSIN DM  Take 10 mLs by mouth every 4 (four) hours.     ipratropium 0.02 % nebulizer solution  Commonly known as:  ATROVENT  Take 500 mcg by nebulization as needed. For shortness of breath.     levalbuterol 0.63 MG/3ML nebulizer solution  Commonly known as:  XOPENEX  Take 3 mLs (0.63 mg total) by nebulization 2 (two) times daily.     potassium chloride 10 MEQ tablet  Commonly known as:  K-DUR  Take 1 tablet (10 mEq total) by mouth daily.  predniSONE 10 MG tablet  Commonly known as:  DELTASONE  Take 2 tablets (20 mg total) by mouth daily before breakfast. After 3 days take one daily x 6 days then 1/2 daily x 24 days.     valsartan 320 MG tablet  Commonly known as:  DIOVAN  Take 320 mg by mouth daily.     XOPENEX HFA 45 MCG/ACT inhaler  Generic drug:  levalbuterol  Inhale 1-2 puffs into the lungs every 4 (four) hours as needed. For  shortness of breath.           Follow-up Information    Follow up with Patricia Nettle, MD In 2 weeks.   Specialty:  Cardiology   Contact information:   [place] [address] [phone]       Schedule an appointment as soon as possible for a visit with Birdie Riddle, MD.   Specialty:  Cardiology   Why:  As needed   Contact information:   [place] [address] [phone]       Signed: Dixie Dials S 01/11/2015, 6:18 PM

## 2015-01-11 NOTE — Progress Notes (Signed)
UR COMPLETED  

## 2015-02-01 ENCOUNTER — Telehealth: Payer: Self-pay | Admitting: Internal Medicine

## 2015-02-01 NOTE — Telephone Encounter (Signed)
Spoke with the pt and notified of recs per CDY  She states that her PCP is not available this wk  She will come in tomorrow am to see MW at 10:45  ED sooner if needed

## 2015-02-01 NOTE — Telephone Encounter (Signed)
Patient needs a rescue inhaler.  Patient has not been seen since 2012.  She says that she is allergic to Albuterol.  I advised patient that she needs to schedule an appointment, but that I would ask Dr. Annamaria Boots if we can send a rescue for her to hold her over until she can get an appointment.    Allergies  Allergen Reactions  . Albuterol Other (See Comments)    irregular heartbeat   Current Outpatient Prescriptions on File Prior to Visit  Medication Sig Dispense Refill  . azithromycin (ZITHROMAX Z-PAK) 250 MG tablet 2 tablet day 1 then one daily x 4 days. 6 each 0  . carvedilol (COREG) 6.25 MG tablet Take 1 tablet (6.25 mg total) by mouth 2 (two) times daily with a meal. 60 tablet 3  . furosemide (LASIX) 40 MG tablet Take 1 tablet (40 mg total) by mouth 2 (two) times a week. (Patient taking differently: Take 40 mg by mouth daily. ) 30 tablet 6  . guaiFENesin-dextromethorphan (ROBITUSSIN DM) 100-10 MG/5ML syrup Take 10 mLs by mouth every 4 (four) hours. 118 mL 1  . ipratropium (ATROVENT) 0.02 % nebulizer solution Take 500 mcg by nebulization as needed. For shortness of breath.    . levalbuterol (XOPENEX HFA) 45 MCG/ACT inhaler Inhale 1-2 puffs into the lungs every 4 (four) hours as needed. For shortness of breath.    . levalbuterol (XOPENEX) 0.63 MG/3ML nebulizer solution Take 3 mLs (0.63 mg total) by nebulization 2 (two) times daily. 180 mL 12  . potassium chloride (K-DUR) 10 MEQ tablet Take 1 tablet (10 mEq total) by mouth daily. 30 tablet 3  . predniSONE (DELTASONE) 10 MG tablet Take 2 tablets (20 mg total) by mouth daily before breakfast. After 3 days take one daily x 6 days then 1/2 daily x 24 days. 24 tablet 0  . valsartan (DIOVAN) 320 MG tablet Take 320 mg by mouth daily.     No current facility-administered medications on file prior to visit.

## 2015-02-01 NOTE — Telephone Encounter (Signed)
She hasn't been seen in 4 years. It would be best for her to ask her PCP for a rescue inhaler until she can re-establish here, especially if she is restricted as to what she can use.

## 2015-02-02 ENCOUNTER — Encounter: Payer: Self-pay | Admitting: *Deleted

## 2015-02-02 ENCOUNTER — Encounter: Payer: Self-pay | Admitting: Internal Medicine

## 2015-02-02 ENCOUNTER — Ambulatory Visit (INDEPENDENT_AMBULATORY_CARE_PROVIDER_SITE_OTHER): Payer: Medicare HMO | Admitting: Internal Medicine

## 2015-02-02 VITALS — BP 144/66 | HR 85 | Ht 67.5 in | Wt 275.0 lb

## 2015-02-02 DIAGNOSIS — J4530 Mild persistent asthma, uncomplicated: Secondary | ICD-10-CM

## 2015-02-02 DIAGNOSIS — I10 Essential (primary) hypertension: Secondary | ICD-10-CM | POA: Diagnosis not present

## 2015-02-02 DIAGNOSIS — R0600 Dyspnea, unspecified: Secondary | ICD-10-CM | POA: Diagnosis not present

## 2015-02-02 MED ORDER — MONTELUKAST SODIUM 10 MG PO TABS: ORAL_TABLET | ORAL | Status: DC

## 2015-02-02 MED ORDER — FAMOTIDINE 20 MG PO TABS: ORAL_TABLET | ORAL | Status: DC

## 2015-02-02 MED ORDER — BISOPROLOL FUMARATE 5 MG PO TABS: 5.0000 mg | ORAL_TABLET | Freq: Every day | ORAL | Status: DC

## 2015-02-02 MED ORDER — OMEPRAZOLE MAGNESIUM 20 MG PO TBEC
DELAYED_RELEASE_TABLET | ORAL | Status: DC
Start: 2015-02-02 — End: 2015-04-27

## 2015-02-02 NOTE — Progress Notes (Signed)
Subjective:    Patient ID: [name], female    DOB: 1941-04-14,    MRN: [id number]  HPI  44 yobf quit smoking in 1979 saw [facility] shots and inhalers last seen with no cough still doe related to obesity in 2011 by Dr Annamaria Boots on   1) Xopenex Hfa 45 Mcg/act Aero (Levalbuterol Tartrate) .... Prn 2) Singulair 10 Mg Tabs (Montelukast Sodium) .Marland Kitchen.. 1 Daily 3) Brovana 15 Mcg/64ml Nebu (Arformoterol Tartrate) .... Two Times A Day 4) Ipratropium Bromide 0.02 % Soln (Ipratropium Bromide) .... Four Times A Day 5) Azor 5-20 Mg Tabs (Amlodipine-Olmesartan) .... Take 1 By Mouth Once Daily 6) Qvar 80 Mcg/act Aers (Beclomethasone Dipropionate) .... 2 Puffs and Rinse Mouth Once Daily   Admit date: 01/04/2015 Discharge date: 01/11/2015  Admission Diagnoses: Shortness of breath and leg edema r/o chf Obesity Hypertension Asthmatic bronchitis  Discharge Diagnoses:  Acute asthmatic bronchitis Obesity Hypertension Hypokalemia-resolved S/P Permanent pacemaker  Discharged Condition: fair  Hospital Course: 74 year old black female with asthma has shortness of breath and leg edema. No fever. Some chest tightness. She also has bilateral leg edema x 1 week. She has pacemaker insertion in 02/2011. She had Chest x-ray negative for pulmonary edema and her BNP was low at 45.7 pg/mL. Her echocardiogram had no wall motion abnormality with EF of 50 %. Her leg swelling improved with intermittent lasix use. Her respiratory function improved with antibiotic and bronchodilator along with solumedrol use. She was discharged home in stable condition with discontinuation of most anti-hypertensive medications and decrease of Coreg to 6.25 mg. twice daily. Her hyperglycemia resolved with diet. She will be followed by Dr. Montez Morita in 2 weeks and by me as needed.   02/02/2015 1st [facility] office visit/ [facility]   Chief Complaint  Patient presents with  . Pulmonary Consult    Former pt of Dr. Annamaria Boots. Pt c/o increased  SOB, esp at night for the past 5 wks. She also c/o increased cough-prod with white sputum.    back near baseline doe x long walk but still bothered by noct coughing min productive on xopenex and atrovent neb but no inhalers or ICS and def better while on systemic steroids now tapering  Off.  Does not remember why/ when stopped singulair    No obvious   day to day or daytime variabilty or assoc  cp or chest tightness, subjective wheeze overt sinus or hb symptoms. No unusual exp hx or h/o childhood pna/ asthma or knowledge of premature birth.  Sleeping ok without nocturnal  or early am exacerbation  of respiratory  c/o's or need for noct saba. Also denies any obvious fluctuation of symptoms with weather or environmental changes or other aggravating or alleviating factors except as outlined above   Current Medications, Allergies, Complete Past Medical History, Past Surgical History, Family History, and Social History were reviewed in [employer] record.            Review of Systems  Constitutional: Negative for fever, chills and unexpected weight change.  HENT: Negative for congestion, dental problem, ear pain, nosebleeds, postnasal drip, rhinorrhea, sinus pressure, sneezing, sore throat, trouble swallowing and voice change.   Eyes: Negative for visual disturbance.  Respiratory: Positive for cough and shortness of breath. Negative for choking.   Cardiovascular: Positive for leg swelling. Negative for chest pain.  Gastrointestinal: Negative for vomiting, abdominal pain and diarrhea.  Genitourinary: Negative for difficulty urinating.  Musculoskeletal: Negative for arthralgias.  Skin: Negative for  rash.  Neurological: Negative for tremors, syncope and headaches.  Hematological: Does not bruise/bleed easily.       Objective:   Physical Exam  Massively obese bm nad  Wt Readings from Last 3 Encounters:  02/02/15 275 lb (124.739 kg)  01/11/15 274 lb 6.4 oz (124.467 kg)   01/02/15 277 lb (125.646 kg)    Vital signs reviewed   HEENT: nl dentition, turbinates, and orophanx. Nl external ear canals without cough reflex   NECK :  without JVD/Nodes/TM/ nl carotid upstrokes bilaterally   LUNGS: no acc muscle use, clear to A and P bilaterally without cough on insp or exp maneuvers   CV:  RRR  no s3 or murmur or increase in P2,  Trace bilateral low ext edema   ABD:  soft and nontender with nl excursion in the supine position. No bruits or organomegaly, bowel sounds nl  MS:  warm without deformities, calf tenderness, cyanosis or clubbing  SKIN: warm and dry without lesions    NEURO:  alert, approp, no deficits      I personally reviewed images and agree with radiology impression as follows:  CXR:  01/04/15 1. No change from the recent prior study. 2. Streaky medial lower lobe opacity most likely chronic bronchial wall prominence. Acute bronchitis is possible. No evidence of lobar pneumonia. No pulmonary edema.       Assessment & Plan:

## 2015-02-02 NOTE — Patient Instructions (Addendum)
Stop carevidol Start bisoprolol 5 mg daily  Restart singulair 10 mg daily   Fill the prescriptions for your nebulizer  then return to see me in 2 weeks and finish your prednisone up   Only use your nebulizer  as a rescue medication to be used if you can't catch your breath by resting or doing a relaxed purse lip breathing pattern.  - The less you use it, the better it will work when you need it. - Ok to use up to neb up to  every 4 hours if you must but call for immediate appointment if use goes up over your usual need    Try prilosec 20mg   Take 30-60 min before first meal of the day and Pepcid (famotidine) 20 mg one bedtime until return  GERD (REFLUX)  is an extremely common cause of respiratory symptoms just like yours , many times with no obvious heartburn at all.    It can be treated with medication, but also with lifestyle changes including elevation of the head of your bed (ideally with 6 inch  bed blocks),  Smoking cessation, avoidance of late meals, excessive alcohol, and avoid fatty foods, chocolate, peppermint, colas, red wine, and acidic juices such as orange juice.  NO MINT OR MENTHOL PRODUCTS SO NO COUGH DROPS  USE SUGARLESS CANDY INSTEAD (Jolley ranchers or Stover's or Life Savers) or even ice chips will also do - the key is to swallow to prevent all throat clearing. NO OIL BASED VITAMINS - use powdered substitutes.  Please schedule a follow up office visit in 2 weeks, sooner if needed with all medications/ nebulizer solutions in hand /including over the counter meds

## 2015-02-03 ENCOUNTER — Encounter: Payer: Self-pay | Admitting: Internal Medicine

## 2015-02-03 DIAGNOSIS — I10 Essential (primary) hypertension: Secondary | ICD-10-CM | POA: Insufficient documentation

## 2015-02-03 NOTE — Assessment & Plan Note (Signed)
Strongly prefer in this setting: Bystolic, the most beta -1  selective Beta blocker available in sample form, with bisoprolol the most selective generic choice  on the market.   Try bisoprolol 5 mg daily  

## 2015-02-03 NOTE — Assessment & Plan Note (Signed)
Likely mostly obesity / deconditioning but this is dx of exclusion/ w/u in progress

## 2015-02-03 NOTE — Assessment & Plan Note (Signed)
Body mass index is 42.41 kg/(m^2).  No results found for: TSH   Contributing to gerd tendency/ doe/ needs to achieve and maintain neg calorie balance >f/u primary care   - needs TSH if not recently done

## 2015-02-03 NOTE — Assessment & Plan Note (Addendum)
-   spirometry 02/02/15 > restrictive only   DDX of  difficult airways management all start with A and  include Adherence, Ace Inhibitors, Acid Reflux, Active Sinus Disease, Alpha 1 Antitripsin deficiency, Anxiety masquerading as Airways dz,  ABPA,  allergy(esp in young), Aspiration (esp in elderly), Adverse effects of meds,  Active smokers, A bunch of PE's (a small clot burden can't cause this syndrome unless there is already severe underlying pulm or vascular dz with poor reserve) plus two Bs  = Bronchiectasis and Beta blocker use..and one C= CHF   Adherence is always the initial "prime suspect" and is a multilayered concern that requires a "trust but verify" approach in every patient - starting with knowing how to use medications, especially inhalers, correctly, keeping up with refills and understanding the fundamental difference between maintenance and prns vs those medications only taken for a very short course and then stopped and not refilled.   - no longer has hfa's at all / reliant on nebs s ICS not a good longterm plan / goals reviewed  - not clear why/ when stopped singulair  ? Acid (or non-acid) GERD > always difficult to exclude as up to 75% of pts in some series report no assoc GI/ Heartburn symptoms> rec max (24h)  acid suppression and diet restrictions/ reviewed and instructions given in writing.   ? Allergy > taper off steroids as planned, add back singulair  ? Sinus dz > consider next  ? BB effects > see hbp  ? chf >  Ruled out at admit   I had an extended discussion with the patient reviewing all relevant studies completed to date x 61m     Each maintenance medication was reviewed in detail including most importantly the difference between maintenance and prns and under what circumstances the prns are to be triggered using an action plan format that is not reflected in the computer generated alphabetically organized AVS.    Please see instructions for details which were  reviewed in writing and the patient given a copy highlighting the part that I personally wrote and discussed at today's ov.

## 2015-02-16 ENCOUNTER — Encounter: Payer: Self-pay | Admitting: Internal Medicine

## 2015-02-16 ENCOUNTER — Ambulatory Visit (INDEPENDENT_AMBULATORY_CARE_PROVIDER_SITE_OTHER): Payer: Medicare HMO | Admitting: Internal Medicine

## 2015-02-16 VITALS — BP 136/78 | HR 93 | Ht 67.5 in | Wt 272.6 lb

## 2015-02-16 DIAGNOSIS — R0600 Dyspnea, unspecified: Secondary | ICD-10-CM

## 2015-02-16 DIAGNOSIS — J4530 Mild persistent asthma, uncomplicated: Secondary | ICD-10-CM

## 2015-02-16 MED ORDER — MONTELUKAST SODIUM 10 MG PO TABS: ORAL_TABLET | ORAL | Status: DC

## 2015-02-16 MED ORDER — FAMOTIDINE 20 MG PO TABS
ORAL_TABLET | ORAL | Status: DC
Start: 2015-02-16 — End: 2015-04-27

## 2015-02-16 NOTE — Progress Notes (Signed)
Subjective:    Patient ID: [name], female    DOB: [birth year],    MRN: [id number]    Brief patient profile:  62 yobf  quit smoking in 1979 saw Dr Gerilyn Nestle shots and inhalers last seen with no cough still doe related to obesity in 2011 by Dr Annamaria Boots on   1) Xopenex Hfa 45 Mcg/act Aero (Levalbuterol Tartrate) .... Prn 2) Singulair 10 Mg Tabs (Montelukast Sodium) .Marland Kitchen.. 1 Daily 3) Brovana 15 Mcg/73ml Nebu (Arformoterol Tartrate) .... Two Times A Day 4) Ipratropium Bromide 0.02 % Soln (Ipratropium Bromide) .... Four Times A Day 5) Azor 5-20 Mg Tabs (Amlodipine-Olmesartan) .... Take 1 By Mouth Once Daily 6) Qvar 80 Mcg/act Aers (Beclomethasone Dipropionate) .... 2 Puffs and Rinse Mouth Once Daily   Admit date: 01/04/2015 Discharge date: 01/11/2015  Admission Diagnoses: Shortness of breath and leg edema r/o chf Obesity Hypertension Asthmatic bronchitis  Discharge Diagnoses:  Acute asthmatic bronchitis Obesity Hypertension Hypokalemia-resolved S/P Permanent pacemaker  Discharged Condition: fair  Hospital Course: 74 year old black female with asthma has shortness of breath and leg edema. No fever. Some chest tightness. She also has bilateral leg edema x 1 week. She has pacemaker insertion in 02/2011. She had Chest x-ray negative for pulmonary edema and her BNP was low at 45.7 pg/mL. Her echocardiogram had no wall motion abnormality with EF of 50 %. Her leg swelling improved with intermittent lasix use. Her respiratory function improved with antibiotic and bronchodilator along with solumedrol use. She was discharged home in stable condition with discontinuation of most anti-hypertensive medications and decrease of Coreg to 6.25 mg. twice daily. Her hyperglycemia resolved with diet. She will be followed by Dr. Montez Morita in 2 weeks and by me as needed.   02/02/2015 1st [facility] office visit/ [facility]   Chief Complaint  Patient presents with  . Pulmonary Consult    Former pt of  Dr. Annamaria Boots. Pt c/o increased SOB, esp at night for the past 5 wks. She also c/o increased cough-prod with white sputum.    back near baseline doe x long walk but still bothered by noct coughing min productive on xopenex and atrovent neb but no inhalers or ICS and def better while on systemic steroids now tapering  Off.  Does not remember why/ when stopped singulair   rec Stop carevidol Start bisoprolol 5 mg daily  Restart singulair 10 mg daily  Only use your nebulizer for rescue     Try prilosec 20mg   Take 30-60 min before first meal of the day and Pepcid (famotidine) 20 mg one bedtime until return GERD eit    02/16/2015 f/u ov/Laini Urick re: asthma/ on singulair but no ICS Chief Complaint  Patient presents with  . Follow-up    Pt states that her breathing has returned to normal baseline. Her cough is much improved. No new co's today. She has not needed neb.   still finishing up prednisone , one more day left in taper   Not limited by breathing from desired activities    No obvious day to day or daytime variability or assoc chronic cough or cp or chest tightness, subjective wheeze or overt sinus or hb symptoms. No unusual exp hx or h/o childhood pna/ asthma or knowledge of premature birth.  Sleeping ok without nocturnal  or early am exacerbation  of respiratory  c/o's or need for noct saba. Also denies any obvious fluctuation of symptoms with weather or environmental changes or other aggravating or alleviating factors except  as outlined above   Current Medications, Allergies, Complete Past Medical History, Past Surgical History, Family History, and Social History were reviewed in [employer] record.  ROS  The following are not active complaints unless bolded sore throat, dysphagia, dental problems, itching, sneezing,  nasal congestion or excess/ purulent secretions, ear ache,   fever, chills, sweats, unintended wt loss, classically pleuritic or exertional cp, hemoptysis,   orthopnea pnd or leg swelling, presyncope, palpitations, abdominal pain, anorexia, nausea, vomiting, diarrhea  or change in bowel or bladder habits, change in stools or urine, dysuria,hematuria,  rash, arthralgias, visual complaints, headache, numbness, weakness or ataxia or problems with walking or coordination,  change in mood/affect or memory.              Objective:   Physical Exam  Massively obese bf nad  02/16/2015       273  Wt Readings from Last 3 Encounters:  02/02/15 275 lb (124.739 kg)  01/11/15 274 lb 6.4 oz (124.467 kg)  01/02/15 277 lb (125.646 kg)    Vital signs reviewed   HEENT: nl dentition, turbinates, and orophanx. Nl external ear canals without cough reflex   NECK :  without JVD/Nodes/TM/ nl carotid upstrokes bilaterally   LUNGS: no acc muscle use, clear to A and P bilaterally without cough on insp or exp maneuvers   CV:  RRR  no s3 or murmur or increase in P2,  1+  bilateral low ext pitting edema   ABD:  soft and nontender with nl excursion in the supine position. No bruits or organomegaly, bowel sounds nl  MS:  warm without deformities, calf tenderness, cyanosis or clubbing  SKIN: warm and dry without lesions    NEURO:  alert, approp, no deficits      I personally reviewed images and agree with radiology impression as follows:  CXR:  01/04/15 1. No change from the recent prior study. 2. Streaky medial lower lobe opacity most likely chronic bronchial wall prominence. Acute bronchitis is possible. No evidence of lobar pneumonia. No pulmonary edema.       Assessment & Plan:   Outpatient Encounter Prescriptions as of 02/16/2015  Medication Sig  . bisoprolol (ZEBETA) 5 MG tablet Take 1 tablet (5 mg total) by mouth daily.  . furosemide (LASIX) 40 MG tablet Take 1 tablet (40 mg total) by mouth 2 (two) times a week.  . levalbuterol (XOPENEX) 0.63 MG/3ML nebulizer solution Take 3 mLs (0.63 mg total) by nebulization 2 (two) times daily.  Marland Kitchen  omeprazole (PRILOSEC OTC) 20 MG tablet Take 30-60 min before first meal of the day  . famotidine (PEPCID) 20 MG tablet One at bedtime  . montelukast (SINGULAIR) 10 MG tablet One at bedtime every night  . [DISCONTINUED] famotidine (PEPCID) 20 MG tablet One at bedtime  . [DISCONTINUED] guaiFENesin-dextromethorphan (ROBITUSSIN DM) 100-10 MG/5ML syrup Take 10 mLs by mouth every 4 (four) hours.  . [DISCONTINUED] ipratropium (ATROVENT) 0.02 % nebulizer solution Take 500 mcg by nebulization as needed. For shortness of breath.  . [DISCONTINUED] levalbuterol (XOPENEX HFA) 45 MCG/ACT inhaler Inhale 1-2 puffs into the lungs every 4 (four) hours as needed. For shortness of breath.  . [DISCONTINUED] montelukast (SINGULAIR) 10 MG tablet One at bedtime every night  . [DISCONTINUED] potassium chloride (K-DUR) 10 MEQ tablet Take 1 tablet (10 mEq total) by mouth daily.  . [DISCONTINUED] predniSONE (DELTASONE) 10 MG tablet Take 10 mg by mouth as directed.  . [DISCONTINUED] valsartan (DIOVAN) 320 MG tablet Take 320 mg  by mouth daily.   No facility-administered encounter medications on file as of 02/16/2015.

## 2015-02-16 NOTE — Patient Instructions (Addendum)
Continue  bisoprolol 5 mg daily  Continue  Singulair(montelukast)  10 mg daily   Only use your nebulizer  as a rescue medication to be used if you can't catch your breath by resting or doing a relaxed purse lip breathing pattern.  - The less you use it, the better it will work when you need it. - Ok to use up to neb up to  every 4 hours if you must but call for immediate appointment if use goes up over your usual need   Try prilosec 20mg   Take 30-60 min before first meal of the day and Pepcid (famotidine) 20 mg one bedtime(over the counter)  until return  GERD (REFLUX)  is an extremely common cause of respiratory symptoms just like yours , many times with no obvious heartburn at all.    It can be treated with medication, but also with lifestyle changes including elevation of the head of your bed (ideally with 6 inch  bed blocks),  Smoking cessation, avoidance of late meals, excessive alcohol, and avoid fatty foods, chocolate, peppermint, colas, red wine, and acidic juices such as orange juice.  NO MINT OR MENTHOL PRODUCTS SO NO COUGH DROPS  USE SUGARLESS CANDY INSTEAD (Jolley ranchers or Stover's or Life Savers) or even ice chips will also do - the key is to swallow to prevent all throat clearing. NO OIL BASED VITAMINS - use powdered substitutes..      Please schedule a follow up office visit in 6 weeks, call sooner if needed with pfts

## 2015-02-17 ENCOUNTER — Encounter: Payer: Self-pay | Admitting: Internal Medicine

## 2015-02-17 NOTE — Assessment & Plan Note (Signed)
Body mass index is 42.04  = minimal trend downward  No results found for: TSH   Contributing to gerd tendency/ doe/ needs to achieve and maintain neg calorie balance > ? Needs TSH update >> f/u primary care

## 2015-02-17 NOTE — Assessment & Plan Note (Addendum)
-  spirometry 02/02/15 > restrictive only  - added back singulair 02/03/2015 >>>    I had an extended discussion with the patient reviewing all relevant studies completed to date and  lasting 15 to 20 minutes of a 25 minute visit on the following ongoing concerns:  1) All goals of chronic asthma control met including optimal function and elimination of symptoms with minimal need for rescue therapy on just singulair, though she is still tapering off prednisone so if flares on singulair will need qvar added back also as maint   2) Contingencies discussed in full including contacting this office immediately if not controlling the symptoms using the rule of two's.     3) given obesity strongly rec continue max gerd rx for now    4) Each maintenance medication was reviewed in detail including most importantly the difference between maintenance and prns and under what circumstances the prns are to be triggered using an action plan format that is not reflected in the computer generated alphabetically organized AVS.    Please see instructions for details which were reviewed in writing and the patient given a copy highlighting the part that I personally wrote and discussed at today's ov.   Will see her back completely off prednisone for baseline pfts and go forward from there

## 2015-03-02 ENCOUNTER — Encounter: Payer: Self-pay | Admitting: *Deleted

## 2015-04-01 ENCOUNTER — Ambulatory Visit: Payer: Medicare HMO | Admitting: Internal Medicine

## 2015-04-27 ENCOUNTER — Ambulatory Visit (INDEPENDENT_AMBULATORY_CARE_PROVIDER_SITE_OTHER): Payer: Medicare HMO | Admitting: Internal Medicine

## 2015-04-27 ENCOUNTER — Encounter: Payer: Self-pay | Admitting: Internal Medicine

## 2015-04-27 VITALS — BP 112/66 | HR 63 | Ht 67.0 in | Wt 278.1 lb

## 2015-04-27 DIAGNOSIS — I442 Atrioventricular block, complete: Secondary | ICD-10-CM

## 2015-04-27 DIAGNOSIS — Z950 Presence of cardiac pacemaker: Secondary | ICD-10-CM

## 2015-04-27 DIAGNOSIS — I10 Essential (primary) hypertension: Secondary | ICD-10-CM

## 2015-04-27 LAB — CUP PACEART INCLINIC DEVICE CHECK
Brady Statistic RA Percent Paced: 57 %
Brady Statistic RV Percent Paced: 99.98 %
Date Time Interrogation Session: 20160920164317
Lead Channel Pacing Threshold Amplitude: 1 V
Lead Channel Pacing Threshold Amplitude: 1 V
Lead Channel Pacing Threshold Pulse Width: 0.4 ms
Lead Channel Pacing Threshold Pulse Width: 0.5 ms
Lead Channel Setting Pacing Amplitude: 2 V
Lead Channel Setting Pacing Amplitude: 2.5 V
MDC IDC MSMT BATTERY REMAINING LONGEVITY: 72 mo
MDC IDC MSMT BATTERY VOLTAGE: 2.92 V
MDC IDC MSMT LEADCHNL RA IMPEDANCE VALUE: 400 Ohm
MDC IDC MSMT LEADCHNL RA PACING THRESHOLD AMPLITUDE: 1 V
MDC IDC MSMT LEADCHNL RA PACING THRESHOLD PULSEWIDTH: 0.4 ms
MDC IDC MSMT LEADCHNL RA SENSING INTR AMPL: 3.3 mV
MDC IDC MSMT LEADCHNL RV IMPEDANCE VALUE: 400 Ohm
MDC IDC MSMT LEADCHNL RV PACING THRESHOLD AMPLITUDE: 1 V
MDC IDC MSMT LEADCHNL RV PACING THRESHOLD PULSEWIDTH: 0.5 ms
MDC IDC SET LEADCHNL RV PACING PULSEWIDTH: 0.5 ms
MDC IDC SET LEADCHNL RV SENSING SENSITIVITY: 4 mV
Pulse Gen Model: 2210
Pulse Gen Serial Number: 7254877

## 2015-04-27 MED ORDER — WARFARIN SODIUM 3 MG PO TABS: 3.0000 mg | ORAL_TABLET | Freq: Every day | ORAL | Status: DC

## 2015-04-27 NOTE — Patient Instructions (Signed)
Medication Instructions:  Your physician has recommended you make the following change in your medication:  1) Start Warfarin 3 mg daily     Labwork: None ordered  Testing/Procedures: None ordered  Follow-Up:  Your physician recommends that you schedule a follow-up appointment next week in Coumadin clinic---new start Warfarin today   Your physician wants you to follow-up in: 6 months in the device clinic and  12 months with Dr Knox Saliva will receive a reminder letter in the mail two months in advance. If you don't receive a letter, please call our office to schedule the follow-up appointment.     Any Other Special Instructions Will Be Listed Below (If Applicable).

## 2015-04-28 NOTE — Progress Notes (Signed)
HPI [name] returns today for followup. She is a very pleasant 74 year old woman with a history of obesity, hypertension, COPD, and symptomatic tachybradycardia syndrome. She is status post permanent pacemaker insertion. The patient denies chest pain or shortness of breath. She does note dietary indiscretion particularly with sodium and has had some problems with peripheral edema. She intermittently uses her Lasix. She has minimal dyspnea. She has been frustrated by her inability to lose weight. She admits to dietary indiscretion.  Allergies  Allergen Reactions  . Albuterol Other (See Comments)    irregular heartbeat     Current Outpatient Prescriptions  Medication Sig Dispense Refill  . bisoprolol (ZEBETA) 5 MG tablet Take 1 tablet (5 mg total) by mouth daily. 30 tablet 11  . famotidine (PEPCID) 20 MG tablet Take 20 mg by mouth 2 (two) times daily.    . furosemide (LASIX) 40 MG tablet Take 1 tablet (40 mg total) by mouth 2 (two) times a week. 30 tablet 6  . levalbuterol (XOPENEX) 0.63 MG/3ML nebulizer solution Take 0.63 mg by nebulization every 4 (four) hours as needed for wheezing or shortness of breath.    . montelukast (SINGULAIR) 10 MG tablet Take 10 mg by mouth at bedtime.    Marland Kitchen omeprazole (PRILOSEC) 20 MG capsule Take 20 mg by mouth daily. Take 30-60 min before first meal of the day    . warfarin (COUMADIN) 3 MG tablet Take 1 tablet (3 mg total) by mouth daily. 30 tablet 1   No current facility-administered medications for this visit.     Past Medical History  Diagnosis Date  . Allergic rhinitis   . Asthma   . PAT (paroxysmal atrial tachycardia) 2008  . Hyperlipidemia   . CHF (congestive heart failure)   . Emphysema of lung   . Pneumonia     hx  . Pacemaker   . 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 Arthritis   . Anemia     hx due to med  . Hypertension     ROS:   All systems reviewed and negative except as noted in the  HPI.   Past Surgical History  Procedure Laterality Date  . Tonsillectomy    . Hemorrhoid surgery    . Tah and bso    . Pacemaker insertion  02-06-2011  . Breast biopsy  04/30/2012    Procedure: BREAST BIOPSY WITH NEEDLE LOCALIZATION;  Surgeon: Rolm Bookbinder, MD;  Location: [facility];  Service: General;  Laterality: Left;  Left breast wire localization biospy  . Abdominal hysterectomy       Family History  Problem Relation Age of Onset  . Heart disease Mother   . Cancer Sister     breast     Social History   Social History  . Marital Status: Widowed    Spouse Name: N/A  . Number of Children: 2  . Years of Education: N/A   Occupational History  . cna    Social History Main Topics  . Smoking status: Former Smoker -- 1.00 packs/day for 4 years    Types: Cigarettes    Quit date: 08/07/1977  . Smokeless tobacco: Never Used  . Alcohol Use: No     Comment: quit 1979  . Drug Use: No  . Sexual Activity: Not on file   Other Topics Concern  . Not on file   Social History Narrative   Pt has 11 siblings  in all     BP 112/66 mmHg  Pulse 63  Ht 5\' 7"  (1.702 m)  Wt 278 lb 1.9 oz (126.154 kg)  BMI 43.55 kg/m2  Physical Exam:  Well appearing 74 year old woman, looking older than her stated age, NAD HEENT: Unremarkable Neck:  7 cm JVD, no thyromegally Back:  No CVA tenderness Lungs:  Clear with no wheezes, rales, or rhonchi. HEART:  Distant, Regular rate rhythm, no murmurs, no rubs, no clicks Abd:  soft, positive bowel sounds, no organomegally, no rebound, no guarding Ext:  2 plus pulses, trace peripheral edema, no cyanosis, no clubbing Skin:  No rashes no nodules Neuro:  CN II through XII intact, motor grossly intact  DEVICE  Normal device function.  See PaceArt for details.   Assess/Plan:

## 2015-04-28 NOTE — Assessment & Plan Note (Signed)
Her blood pressure is well controlled. She will continue her current medications. 

## 2015-04-28 NOTE — Assessment & Plan Note (Signed)
Her [employer] dual-chamber pacemaker is working normally. We'll plan to recheck in several months. 

## 2015-04-28 NOTE — Assessment & Plan Note (Signed)
The importance of weight loss was discussed. I encouraged the patient to lose weight.

## 2015-04-29 ENCOUNTER — Other Ambulatory Visit (INDEPENDENT_AMBULATORY_CARE_PROVIDER_SITE_OTHER): Payer: Medicare HMO

## 2015-04-29 ENCOUNTER — Ambulatory Visit (INDEPENDENT_AMBULATORY_CARE_PROVIDER_SITE_OTHER): Payer: Medicare HMO | Admitting: Internal Medicine

## 2015-04-29 ENCOUNTER — Encounter: Payer: Self-pay | Admitting: Internal Medicine

## 2015-04-29 VITALS — BP 122/66 | HR 67 | Ht 67.5 in | Wt 279.8 lb

## 2015-04-29 DIAGNOSIS — R0600 Dyspnea, unspecified: Secondary | ICD-10-CM

## 2015-04-29 DIAGNOSIS — I509 Heart failure, unspecified: Secondary | ICD-10-CM | POA: Diagnosis not present

## 2015-04-29 DIAGNOSIS — J4530 Mild persistent asthma, uncomplicated: Secondary | ICD-10-CM | POA: Diagnosis not present

## 2015-04-29 LAB — CBC WITH DIFFERENTIAL/PLATELET
BASOS PCT: 0.3 % (ref 0.0–3.0)
Basophils Absolute: 0 10*3/uL (ref 0.0–0.1)
EOS ABS: 0.1 10*3/uL (ref 0.0–0.7)
EOS PCT: 1.4 % (ref 0.0–5.0)
HEMATOCRIT: 40.3 % (ref 36.0–46.0)
HEMOGLOBIN: 13.3 g/dL (ref 12.0–15.0)
LYMPHS PCT: 20 % (ref 12.0–46.0)
Lymphs Abs: 1.6 10*3/uL (ref 0.7–4.0)
MCHC: 32.9 g/dL (ref 30.0–36.0)
MCV: 93.1 fl (ref 78.0–100.0)
Monocytes Absolute: 0.7 10*3/uL (ref 0.1–1.0)
Monocytes Relative: 8.8 % (ref 3.0–12.0)
NEUTROS ABS: 5.6 10*3/uL (ref 1.4–7.7)
Neutrophils Relative %: 69.5 % (ref 43.0–77.0)
PLATELETS: 217 10*3/uL (ref 150.0–400.0)
RBC: 4.33 Mil/uL (ref 3.87–5.11)
RDW: 13.4 % (ref 11.5–15.5)
WBC: 8.1 10*3/uL (ref 4.0–10.5)

## 2015-04-29 LAB — BASIC METABOLIC PANEL
BUN: 17 mg/dL (ref 6–23)
CALCIUM: 9.1 mg/dL (ref 8.4–10.5)
CO2: 29 mEq/L (ref 19–32)
CREATININE: 1.08 mg/dL (ref 0.40–1.20)
Chloride: 105 mEq/L (ref 96–112)
GFR: 63.36 mL/min (ref >60.00)
GLUCOSE: 97 mg/dL (ref 70–99)
POTASSIUM: 3.8 meq/L (ref 3.5–5.1)
Sodium: 141 mEq/L (ref 135–145)

## 2015-04-29 LAB — TSH: TSH: 1.36 u[IU]/mL (ref 0.35–4.50)

## 2015-04-29 LAB — BRAIN NATRIURETIC PEPTIDE: Pro B Natriuretic peptide (BNP): 63 pg/mL (ref 0.0–100.0)

## 2015-04-29 MED ORDER — SPIRONOLACTONE 25 MG PO TABS: 25.0000 mg | ORAL_TABLET | Freq: Every day | ORAL | Status: DC

## 2015-04-29 NOTE — Progress Notes (Signed)
Subjective:    Patient ID: [name], female    DOB: [birth year],    MRN: [id number]    Brief patient profile:  74 yobf  quit smoking in 1979 saw Dr Gerilyn Nestle shots and inhalers last seen with no cough still doe related to obesity in 2011 by Dr Annamaria Boots on   1) Xopenex Hfa 45 Mcg/act Aero (Levalbuterol Tartrate) .... Prn 2) Singulair 10 Mg Tabs (Montelukast Sodium) .Marland Kitchen.. 1 Daily 3) Brovana 15 Mcg/68ml Nebu (Arformoterol Tartrate) .... Two Times A Day 4) Ipratropium Bromide 0.02 % Soln (Ipratropium Bromide) .... Four Times A Day 5) Azor 5-20 Mg Tabs (Amlodipine-Olmesartan) .... Take 1 By Mouth Once Daily 6) Qvar 80 Mcg/act Aers (Beclomethasone Dipropionate) .... 2 Puffs and Rinse Mouth Once Daily   Admit date: 01/04/2015 Discharge date: 01/11/2015  Admission Diagnoses: Shortness of breath and leg edema r/o chf Obesity Hypertension Asthmatic bronchitis  Discharge Diagnoses:  Acute asthmatic bronchitis Obesity Hypertension Hypokalemia-resolved S/P Permanent pacemaker  Discharged Condition: fair  Hospital Course: 74 year old black female with asthma has shortness of breath and leg edema. No fever. Some chest tightness. She also has bilateral leg edema x 1 week. She has pacemaker insertion in 02/2011. She had Chest x-ray negative for pulmonary edema and her BNP was low at 45.7 pg/mL. Her echocardiogram had no wall motion abnormality with EF of 50 %. Her leg swelling improved with intermittent lasix use. Her respiratory function improved with antibiotic and bronchodilator along with solumedrol use. She was discharged home in stable condition with discontinuation of most anti-hypertensive medications and decrease of Coreg to 6.25 mg. twice daily. Her hyperglycemia resolved with diet. She will be followed by Dr. Montez Morita in 2 weeks and by me as needed.   02/02/2015 1st [facility] office visit/ Wert   Chief Complaint  Patient presents with  . Pulmonary Consult    Former pt of  Dr. Annamaria Boots. Pt c/o increased SOB, esp at night for the past 5 wks. She also c/o increased cough-prod with white sputum.    back near baseline doe x long walk but still bothered by noct coughing min productive on xopenex and atrovent neb but no inhalers or ICS and def better while on systemic steroids now tapering  Off.  Does not remember why/ when stopped singulair   rec Stop carevidol Start bisoprolol 5 mg daily  Restart singulair 10 mg daily  Only use your nebulizer for rescue     Try prilosec 20mg   Take 30-60 min before first meal of the day and Pepcid (famotidine) 20 mg one bedtime until return GERD eit    76/07/2015 f/u ov/Wert re: asthma/ on singulair but no ICS Chief Complaint  Patient presents with  . Follow-up    Pt states that her breathing has returned to normal baseline. Her cough is much improved. No new co's today. She has not needed neb.  still finishing up prednisone, one more day left in taper  rec Continue  bisoprolol 5 mg daily  Continue  Singulair(montelukast)  10 mg daily  Only use your nebulizer  as a rescue medication Try prilosec 20mg   Take 30-60 min before first meal of the day and Pepcid (famotidine) 20 mg one bedtime(over the counter)  until return GERD diet    04/29/2015 f/u ov/Wert re: ashtma, obesity/ worse sob  Chief Complaint  Patient presents with  . Follow-up    Pt states having increased SOB for the past 3-4 days.  She gets out of  breath walking up steps and sometimes just walking a flat surface. She also c/o some wheezing. She uses xopenex nebs 4-5 x per wk on average.     Onset was 9/18 noted couldn't get up usual  steps s sob / has not tried xopenex before exertion  but having hoarseness/wheezing and leg swelling which she says was about the same on her last ov though note she said then her breathing was fine and did not report the breathing problem to Dr Lovena Le 9/20 and he found no problem with her pacemaker   No obvious day to day or daytime  variability or assoc chronic cough or cp or chest tightness, or overt sinus or hb symptoms. No unusual exp hx or h/o childhood pna/ asthma or knowledge of premature birth.  Sleeping ok without nocturnal  or early am exacerbation  of respiratory  c/o's or need for noct saba. Also denies any obvious fluctuation of symptoms with weather or environmental changes or other aggravating or alleviating factors except as outlined above   Current Medications, Allergies, Complete Past Medical History, Past Surgical History, Family History, and Social History were reviewed in [facility] record.  ROS  The following are not active complaints unless bolded sore throat, dysphagia, dental problems, itching, sneezing,  nasal congestion or excess/ purulent secretions, ear ache,   fever, chills, sweats, unintended wt loss, classically pleuritic or exertional cp, hemoptysis,  orthopnea pnd or leg swelling, presyncope, palpitations, abdominal pain, anorexia, nausea, vomiting, diarrhea  or change in bowel or bladder habits, change in stools or urine, dysuria,hematuria,  rash, arthralgias, visual complaints, headache, numbness, weakness or ataxia or problems with walking or coordination,  change in mood/affect or memory.              Objective:   Physical Exam  Massively obese bf nad  02/16/2015       273 >  04/29/2015   280  Wt Readings from Last 3 Encounters:  02/02/15 275 lb (124.739 kg)  01/11/15 274 lb 6.4 oz (124.467 kg)  01/02/15 277 lb (125.646 kg)    Vital signs reviewed   HEENT: upper dentures/ lower ok / nl  turbinates, and orophanx. Nl external ear canals without cough reflex   NECK :  without JVD/Nodes/TM/ nl carotid upstrokes bilaterally   LUNGS: no acc muscle use, clear to A and P bilaterally without cough on insp or exp maneuvers   CV:  RRR  no s3 or murmur or increase in P2,  1+  bilateral low ext pitting edema   ABD:  soft and nontender with nl excursion in the  supine position. No bruits or organomegaly, bowel sounds nl  MS:  warm without deformities, calf tenderness, cyanosis or clubbing  SKIN: warm and dry without lesions    NEURO:  alert, approp, no deficits      I personally reviewed images and agree with radiology impression as follows:  CXR:  04/29/2015  Did not go to cxr   Labs ordered/ reviewed:    Lab 04/29/15 1109  NA 141  K 3.8  CL 105  CO2 29  BUN 17  CREATININE 1.08  GLUCOSE 97       Lab 04/29/15 1109  HGB 13.3  HCT 40.3  WBC 8.1  PLT 217.0     Lab Results  Component Value Date   TSH 1.36 04/29/2015     Lab Results  Component Value Date   PROBNP 63.0 04/29/2015  Assessment & Plan:

## 2015-04-29 NOTE — Progress Notes (Signed)
Quick Note:  LMTCB ______ 

## 2015-04-29 NOTE — Patient Instructions (Addendum)
Add aldactone 25 mg to lasix and take both daily   Dulera 100 Take 2 puffs first thing in am and then another 2 puffs about 12 hours later.    When having a bad day with breathing see if using your xopenex turns it into a good day  GERD (REFLUX)  is an extremely common cause of respiratory symptoms just like yours , many times with no obvious heartburn at all.    It can be treated with medication, but also with lifestyle changes including elevation of the head of your bed (ideally with 6 inch  bed blocks),  Smoking cessation, avoidance of late meals, excessive alcohol, and avoid fatty foods, chocolate, peppermint, colas, red wine, and acidic juices such as orange juice.  NO MINT OR MENTHOL PRODUCTS SO NO COUGH DROPS  USE SUGARLESS CANDY INSTEAD (Jolley ranchers or Stover's or Life Savers) or even ice chips will also do - the key is to swallow to prevent all throat clearing. NO OIL BASED VITAMINS - use powdered substitutes.  Please remember to go to the lab and x-ray department downstairs for your tests - we will call you with the results when they are available.      See Tammy NP w/in 2 weeks (bring your meds) to recheck your breathing and blood work on the new combination to control your swelling  Late add:  Did not go to cxr/ needs bmet on return

## 2015-04-30 NOTE — Progress Notes (Signed)
Quick Note:  Spoke with pt and notified of results per Dr. Wert. Pt verbalized understanding and denied any questions.  ______ 

## 2015-05-03 NOTE — Assessment & Plan Note (Addendum)
Due to leg swelling/ which may just be venous insuff and not chf since bnp so low, rec   aldactone 25 mg bid to lasix and recheck in 2 week for bmet

## 2015-05-03 NOTE — Assessment & Plan Note (Signed)
-   spirometry 02/02/15 > restrictive only  - added back singulair 02/03/2015 >>>  - spirometry 04/29/2015 with low mid flows only  - 04/29/2015  extensive coaching HFA effectiveness =    90% > repeat spirometry at 10 m:  No change at all in mid flows   So it is not clear at all this is asthma/allergy  related and more likely related to obesity and deconditioning. The surprise here is  that she finds herself not able to walk up the same steps she usually does but I have no other explanation for her symptoms at present.  I had an extended discussion with the patient reviewing all relevant studies completed to date and  lasting 15 to 20 minutes of a 25 minute visit    Each maintenance medication was reviewed in detail including most importantly the difference between maintenance and prns and under what circumstances the prns are to be triggered using an action plan format that is not reflected in the computer generated alphabetically organized AVS.    Please see instructions for details which were reviewed in writing and the patient given a copy highlighting the part that I personally wrote and discussed at today's ov.

## 2015-05-03 NOTE — Assessment & Plan Note (Signed)
Body mass index is 43.15   Lab Results  Component Value Date   TSH 1.36 04/29/2015     Contributing to gerd tendency/ doe/reviewed the need and the process to achieve and maintain neg calorie balance > defer f/u primary care including intermittently monitoring thyroid status

## 2015-05-03 NOTE — Assessment & Plan Note (Addendum)
Spirometry 02/02/15 restrictive only  - 04/29/2015  Walked RA x 3 laps @ 185 ft each stopped due to  End of study, nl pace, no sob or desa  Symptoms are disproportionate to objective findings and not clear this is a lung problem but pt does appear to have difficult airway management issues. DDX of  difficult airways management all start with A and  include Adherence, Ace Inhibitors, Acid Reflux, Active Sinus Disease, Alpha 1 Antitripsin deficiency, Anxiety masquerading as Airways dz,  ABPA,  allergy(esp in young), Aspiration (esp in elderly), Adverse effects of meds,  Active smokers, A bunch of PE's (a small clot burden can't cause this syndrome unless there is already severe underlying pulm or vascular dz with poor reserve) plus two Bs  = Bronchiectasis and Beta blocker use..and one C= CHF  Adherence is always the initial "prime suspect" and is a multilayered concern that requires a "trust but verify" approach in every patient - starting with knowing how to use medications, especially inhalers, correctly, keeping up with refills and understanding the fundamental difference between maintenance and prns vs those medications only taken for a very short course and then stopped and not refilled.  - The proper method of use, as well as anticipated side effects, of a metered-dose inhaler are discussed and demonstrated to the patient. Improved effectiveness after extensive coaching during this visit to a level of approximately  90% so try hfa = dulera 100 2bid   ? Acid (or non-acid) GERD > always difficult to exclude as up to 75% of pts in some series report no assoc GI/ Heartburn symptoms> rec continue max (24h)  acid suppression and diet restrictions/ reviewed     ? Allergy/ asthma > see sep a/p  ? A bunch of pe's > D dimer nl - while  A nl valute  may miss small peripheral pe, the clot burden with sob is moderately high and the d dimer has a very high neg pred value in this setting   ? Bblocker related >  very unlikely on zebeta    ? chf >  Excluded with bnp <<100

## 2015-05-11 ENCOUNTER — Ambulatory Visit (INDEPENDENT_AMBULATORY_CARE_PROVIDER_SITE_OTHER): Payer: Medicare HMO | Admitting: *Deleted

## 2015-05-11 DIAGNOSIS — I471 Supraventricular tachycardia: Secondary | ICD-10-CM | POA: Diagnosis not present

## 2015-05-11 DIAGNOSIS — Z950 Presence of cardiac pacemaker: Secondary | ICD-10-CM

## 2015-05-11 DIAGNOSIS — Z5181 Encounter for therapeutic drug level monitoring: Secondary | ICD-10-CM | POA: Diagnosis not present

## 2015-05-11 DIAGNOSIS — I495 Sick sinus syndrome: Secondary | ICD-10-CM

## 2015-05-11 LAB — POCT INR: INR: 2.4

## 2015-05-11 NOTE — Patient Instructions (Signed)

## 2015-05-13 ENCOUNTER — Encounter: Payer: Self-pay | Admitting: Adult Health

## 2015-05-13 ENCOUNTER — Ambulatory Visit (INDEPENDENT_AMBULATORY_CARE_PROVIDER_SITE_OTHER): Payer: Medicare HMO | Admitting: Adult Health

## 2015-05-13 ENCOUNTER — Other Ambulatory Visit (INDEPENDENT_AMBULATORY_CARE_PROVIDER_SITE_OTHER): Payer: Medicare HMO

## 2015-05-13 VITALS — BP 116/74 | HR 66 | Temp 97.7°F | Ht 67.0 in | Wt 267.0 lb

## 2015-05-13 DIAGNOSIS — I5033 Acute on chronic diastolic (congestive) heart failure: Secondary | ICD-10-CM | POA: Diagnosis not present

## 2015-05-13 DIAGNOSIS — J4530 Mild persistent asthma, uncomplicated: Secondary | ICD-10-CM | POA: Diagnosis not present

## 2015-05-13 LAB — BASIC METABOLIC PANEL
BUN: 16 mg/dL (ref 6–23)
CALCIUM: 9.4 mg/dL (ref 8.4–10.5)
CO2: 29 mEq/L (ref 19–32)
CREATININE: 1.07 mg/dL (ref 0.40–1.20)
Chloride: 104 mEq/L (ref 96–112)
GFR: 64.03 mL/min (ref >60.00)
GLUCOSE: 103 mg/dL — AB (ref 70–99)
Potassium: 3.8 mEq/L (ref 3.5–5.1)
SODIUM: 140 meq/L (ref 135–145)

## 2015-05-13 MED ORDER — MOMETASONE FURO-FORMOTEROL FUM 100-5 MCG/ACT IN AERO: 2.0000 | INHALATION_SPRAY | Freq: Two times a day (BID) | RESPIRATORY_TRACT | Status: DC

## 2015-05-13 NOTE — Addendum Note (Signed)
Addended by: Osa Craver on: 05/13/2015 04:39 PM   Modules accepted: Orders

## 2015-05-13 NOTE — Assessment & Plan Note (Signed)
DCHF recent decomepnsated , now imrpoved with aldactone and lasix   Plan  Labs today  Continue on current regimen  Low salt diet  Keep legs elevated.  follow up Dr. Melvyn Novas  In 6 weeks with chest xray . And As needed   Please contact office for sooner follow up if symptoms do not improve or worsen or seek emergency care

## 2015-05-13 NOTE — Patient Instructions (Signed)
Labs today  Continue on current regimen  Low salt diet  Keep legs elevated.  follow up Dr. Melvyn Novas  In 6 weeks with chest xray . And As needed   Please contact office for sooner follow up if symptoms do not improve or worsen or seek emergency care

## 2015-05-13 NOTE — Assessment & Plan Note (Signed)
Wt loss  

## 2015-05-13 NOTE — Progress Notes (Signed)
Subjective:    Patient ID: [name], female    DOB: [birth year],    MRN: [id number]    Brief patient profile:  74 yobf  quit smoking in 1979 saw Dr Gerilyn Nestle shots and inhalers last seen with no cough still doe related to obesity in 2011 by Dr Annamaria Boots on   1) Xopenex Hfa 45 Mcg/act Aero (Levalbuterol Tartrate) .... Prn 2) Singulair 10 Mg Tabs (Montelukast Sodium) .Marland Kitchen.. 1 Daily 3) Brovana 15 Mcg/15ml Nebu (Arformoterol Tartrate) .... Two Times A Day 4) Ipratropium Bromide 0.02 % Soln (Ipratropium Bromide) .... Four Times A Day 5) Azor 5-20 Mg Tabs (Amlodipine-Olmesartan) .... Take 1 By Mouth Once Daily 6) Qvar 80 Mcg/act Aers (Beclomethasone Dipropionate) .... 2 Puffs and Rinse Mouth Once Daily   Admit date: 01/04/2015 Discharge date: 01/11/2015  Admission Diagnoses: Shortness of breath and leg edema r/o chf Obesity Hypertension Asthmatic bronchitis  Discharge Diagnoses:  Acute asthmatic bronchitis Obesity Hypertension Hypokalemia-resolved S/P Permanent pacemaker  Discharged Condition: fair  Hospital Course: 74 year old black female with asthma has shortness of breath and leg edema. No fever. Some chest tightness. She also has bilateral leg edema x 1 week. She has pacemaker insertion in 02/2011. She had Chest x-ray negative for pulmonary edema and her BNP was low at 45.7 pg/mL. Her echocardiogram had no wall motion abnormality with EF of 50 %. Her leg swelling improved with intermittent lasix use. Her respiratory function improved with antibiotic and bronchodilator along with solumedrol use. She was discharged home in stable condition with discontinuation of most anti-hypertensive medications and decrease of Coreg to 6.25 mg. twice daily. Her hyperglycemia resolved with diet. She will be followed by Dr. Montez Morita in 2 weeks and by me as needed.   02/02/2015 1st [facility] office visit/ [facility]   Chief Complaint  Patient presents with  . Pulmonary Consult    Former pt of  Dr. Annamaria Boots. Pt c/o increased SOB, esp at night for the past 5 wks. She also c/o increased cough-prod with white sputum.    back near baseline doe x long walk but still bothered by noct coughing min productive on xopenex and atrovent neb but no inhalers or ICS and def better while on systemic steroids now tapering  Off.  Does not remember why/ when stopped singulair   rec Stop carevidol Start bisoprolol 5 mg daily  Restart singulair 10 mg daily  Only use your nebulizer for rescue     Try prilosec 20mg   Take 30-60 min before first meal of the day and Pepcid (famotidine) 20 mg one bedtime until return GERD eit    02/16/2015 f/u ov/[facility] re: asthma/ on singulair but no ICS Chief Complaint  Patient presents with  . Follow-up    Pt states that her breathing has returned to normal baseline. Her cough is much improved. No new co's today. She has not needed neb.  still finishing up prednisone, one more day left in taper  rec Continue  bisoprolol 5 mg daily  Continue  Singulair(montelukast)  10 mg daily  Only use your nebulizer  as a rescue medication Try prilosec 20mg   Take 30-60 min before first meal of the day and Pepcid (famotidine) 20 mg one bedtime(over the counter)  until return GERD diet    04/29/2015 f/u ov/[facility] re: ashtma, obesity/ worse sob  Chief Complaint  Patient presents with  . Follow-up    Pt states having increased SOB for the past 3-4 days.  She gets out of  breath walking up steps and sometimes just walking a flat surface. She also c/o some wheezing. She uses xopenex nebs 4-5 x per wk on average.     Onset was 9/18 noted couldn't get up usual  steps s sob / has not tried xopenex before exertion  but having hoarseness/wheezing and leg swelling which she says was about the same on her last ov though note she said then her breathing was fine and did not report the breathing problem to Dr Lovena Le 9/20 and he found no problem with her pacemaker  >>add aldactone 25mg  daily  And [facility]      05/13/2015 Follow up : Lynford Humphrey /obesity /DCHF  Pt returns for 2 week follow up .  Seen last ov with increased dyspnea and edema.  Aldactone was added to her regimen last ov and she was started on [facility].  'she is feeling much better today w/ less swellling .  Feels breathing is much better.  Wt is down 13lbs .  She denies chest pain , hemoptyiss , orthopnea or fever.  Declines flu shot.  Remains on singulair .     Current Medications, Allergies, Complete Past Medical History, Past Surgical History, Family History, and Social History were reviewed in [employer] record.  ROS  The following are not active complaints unless bolded sore throat, dysphagia, dental problems, itching, sneezing,  nasal congestion or excess/ purulent secretions, ear ache,   fever, chills, sweats, unintended wt loss, classically pleuritic or exertional cp, hemoptysis,  orthopnea pnd or leg swelling, presyncope, palpitations, abdominal pain, anorexia, nausea, vomiting, diarrhea  or change in bowel or bladder habits, change in stools or urine, dysuria,hematuria,  rash, arthralgias, visual complaints, headache, numbness, weakness or ataxia or problems with walking or coordination,  change in mood/affect or memory.              Objective:   Physical Exam  Massively obese bf nad  76/07/2015       273 >  04/29/2015   280  >267 05/13/2015    Vital signs reviewed   HEENT: upper dentures/ lower ok / nl  turbinates, and orophanx. Nl external ear canals without cough reflex   NECK :  without JVD/Nodes/TM/ nl carotid upstrokes bilaterally   LUNGS: no acc muscle use, clear to A and P bilaterally without cough on insp or exp maneuvers   CV:  RRR  no s3 or murmur or increase in P2, tr  Edema   ABD:  soft and nontender with nl excursion in the supine position. No bruits or organomegaly, bowel sounds nl  MS:  warm without deformities, calf tenderness, cyanosis or clubbing  SKIN: warm and  dry without lesions    NEURO:  alert, approp, no deficits        CXR:  04/29/2015  Did not go to cxr   Labs ordered/ reviewed:    Lab 04/29/15 1109  NA 141  K 3.8  CL 105  CO2 29  BUN 17  CREATININE 1.08  GLUCOSE 97       Lab 04/29/15 1109  HGB 13.3  HCT 40.3  WBC 8.1  PLT 217.0     Lab Results  Component Value Date   TSH 1.36 04/29/2015     Lab Results  Component Value Date   PROBNP 63.0 04/29/2015              Assessment & Plan:

## 2015-05-13 NOTE — Assessment & Plan Note (Addendum)
Improved on Dulera  Will get cxr on return, machine is down today in office   Plan  Labs today  Continue on current regimen  Low salt diet  Keep legs elevated.  follow up Dr. Melvyn Novas  In 6 weeks with chest xray . And As needed   Please contact office for sooner follow up if symptoms do not improve or worsen or seek emergency care

## 2015-05-13 NOTE — Progress Notes (Signed)
Chart and office note reviewed in detail along with available   labs > agree with a/p as outlined  

## 2015-05-13 NOTE — Addendum Note (Signed)
Addended by: Osa Craver on: 05/13/2015 04:41 PM   Modules accepted: Orders

## 2015-05-21 ENCOUNTER — Ambulatory Visit (INDEPENDENT_AMBULATORY_CARE_PROVIDER_SITE_OTHER): Payer: Medicare HMO | Admitting: *Deleted

## 2015-05-21 DIAGNOSIS — Z5181 Encounter for therapeutic drug level monitoring: Secondary | ICD-10-CM | POA: Diagnosis not present

## 2015-05-21 DIAGNOSIS — Z950 Presence of cardiac pacemaker: Secondary | ICD-10-CM

## 2015-05-21 DIAGNOSIS — I471 Supraventricular tachycardia: Secondary | ICD-10-CM | POA: Diagnosis not present

## 2015-05-21 LAB — POCT INR: INR: 3.1

## 2015-05-27 ENCOUNTER — Other Ambulatory Visit: Payer: Self-pay | Admitting: Internal Medicine

## 2015-05-28 ENCOUNTER — Ambulatory Visit (INDEPENDENT_AMBULATORY_CARE_PROVIDER_SITE_OTHER): Payer: Medicare HMO | Admitting: *Deleted

## 2015-05-28 DIAGNOSIS — Z5181 Encounter for therapeutic drug level monitoring: Secondary | ICD-10-CM | POA: Diagnosis not present

## 2015-05-28 DIAGNOSIS — Z950 Presence of cardiac pacemaker: Secondary | ICD-10-CM

## 2015-05-28 DIAGNOSIS — I471 Supraventricular tachycardia: Secondary | ICD-10-CM | POA: Diagnosis not present

## 2015-05-28 LAB — POCT INR: INR: 1.3

## 2015-06-03 ENCOUNTER — Ambulatory Visit (INDEPENDENT_AMBULATORY_CARE_PROVIDER_SITE_OTHER): Payer: Medicare HMO | Admitting: *Deleted

## 2015-06-03 DIAGNOSIS — Z5181 Encounter for therapeutic drug level monitoring: Secondary | ICD-10-CM

## 2015-06-03 DIAGNOSIS — I471 Supraventricular tachycardia: Secondary | ICD-10-CM

## 2015-06-03 DIAGNOSIS — Z950 Presence of cardiac pacemaker: Secondary | ICD-10-CM

## 2015-06-03 LAB — POCT INR: INR: 1.7

## 2015-06-10 ENCOUNTER — Ambulatory Visit (INDEPENDENT_AMBULATORY_CARE_PROVIDER_SITE_OTHER): Payer: Medicare HMO | Admitting: *Deleted

## 2015-06-10 DIAGNOSIS — Z5181 Encounter for therapeutic drug level monitoring: Secondary | ICD-10-CM | POA: Diagnosis not present

## 2015-06-10 DIAGNOSIS — Z950 Presence of cardiac pacemaker: Secondary | ICD-10-CM | POA: Diagnosis not present

## 2015-06-10 DIAGNOSIS — I471 Supraventricular tachycardia, unspecified: Secondary | ICD-10-CM

## 2015-06-10 LAB — POCT INR: INR: 1.6

## 2015-06-17 ENCOUNTER — Ambulatory Visit (INDEPENDENT_AMBULATORY_CARE_PROVIDER_SITE_OTHER): Payer: Medicare HMO | Admitting: *Deleted

## 2015-06-17 DIAGNOSIS — Z950 Presence of cardiac pacemaker: Secondary | ICD-10-CM | POA: Diagnosis not present

## 2015-06-17 DIAGNOSIS — Z5181 Encounter for therapeutic drug level monitoring: Secondary | ICD-10-CM

## 2015-06-17 DIAGNOSIS — I471 Supraventricular tachycardia: Secondary | ICD-10-CM

## 2015-06-17 LAB — POCT INR: INR: 1.9

## 2015-06-22 ENCOUNTER — Other Ambulatory Visit: Payer: Self-pay | Admitting: Internal Medicine

## 2015-06-23 ENCOUNTER — Encounter: Payer: Self-pay | Admitting: Internal Medicine

## 2015-06-24 ENCOUNTER — Ambulatory Visit (INDEPENDENT_AMBULATORY_CARE_PROVIDER_SITE_OTHER): Payer: Medicare HMO | Admitting: *Deleted

## 2015-06-24 DIAGNOSIS — Z5181 Encounter for therapeutic drug level monitoring: Secondary | ICD-10-CM

## 2015-06-24 DIAGNOSIS — I471 Supraventricular tachycardia: Secondary | ICD-10-CM

## 2015-06-24 DIAGNOSIS — Z950 Presence of cardiac pacemaker: Secondary | ICD-10-CM | POA: Diagnosis not present

## 2015-06-24 LAB — POCT INR: INR: 1.6

## 2015-06-25 ENCOUNTER — Ambulatory Visit (INDEPENDENT_AMBULATORY_CARE_PROVIDER_SITE_OTHER): Payer: Medicare HMO | Admitting: Internal Medicine

## 2015-06-25 ENCOUNTER — Encounter: Payer: Self-pay | Admitting: Internal Medicine

## 2015-06-25 VITALS — BP 120/70 | HR 119 | Ht 67.5 in | Wt 269.8 lb

## 2015-06-25 DIAGNOSIS — J4530 Mild persistent asthma, uncomplicated: Secondary | ICD-10-CM

## 2015-06-25 DIAGNOSIS — R0600 Dyspnea, unspecified: Secondary | ICD-10-CM

## 2015-06-25 DIAGNOSIS — I10 Essential (primary) hypertension: Secondary | ICD-10-CM | POA: Diagnosis not present

## 2015-06-25 NOTE — Patient Instructions (Signed)
No change in medications as listed on this After Visit Summary    If you are satisfied with your treatment plan,  let your doctor know and he/she can either refill your medications or you can return here when your prescription runs out.     If in any way you are not 100% satisfied,  please tell us.  If 100% better, tell your friends!  Pulmonary follow up is as needed

## 2015-06-25 NOTE — Progress Notes (Signed)
Subjective:    Patient ID: [name], female    DOB: September 06, 1940,    MRN: [id number]    Brief patient profile:  23 yobf  quit smoking in 1979 saw Dr Gerilyn Nestle for asthma rx with  shots and inhalers last seen with no cough still doe related to obesity in 2011 by Dr Annamaria Boots on     Admit date: 01/04/2015 Discharge date: 01/11/2015  Admission Diagnoses: Shortness of breath and leg edema r/o chf Obesity Hypertension Asthmatic bronchitis  Discharge Diagnoses:  Acute asthmatic bronchitis Obesity Hypertension Hypokalemia-resolved S/P Permanent pacemaker     Hospital Course: 74 year old black female with asthma has shortness of breath and leg edema. No fever. Some chest tightness. She also has bilateral leg edema x 1 week. She has pacemaker insertion in 02/2011. She had Chest x-ray negative for pulmonary edema and her BNP was low at 45.7 pg/mL. Her echocardiogram had no wall motion abnormality with EF of 50 %. Her leg swelling improved with intermittent lasix use. Her respiratory function improved with antibiotic and bronchodilator along with solumedrol use. She was discharged home in stable condition with discontinuation of most anti-hypertensive medications and decrease of Coreg to 6.25 mg. twice daily. Her hyperglycemia resolved with diet. She will be followed by Dr. Montez Morita in 2 weeks and by me as needed.   02/02/2015 1st [facility] office visit/ [place] [facility]   Chief Complaint  Patient presents with  . Pulmonary Consult    Former pt of Dr. Annamaria Boots. Pt c/o increased SOB, esp at night for the past 5 wks. She also c/o increased cough-prod with white sputum.    back near baseline doe x long walk but still bothered by noct coughing min productive on xopenex and atrovent neb but no inhalers or ICS and def better while on systemic steroids now tapering  Off.  Does not remember why/ when stopped singulair   rec Stop carevidol Start bisoprolol 5 mg daily  Restart singulair 10 mg daily  Only use  your nebulizer for rescue     Try prilosec 20mg   Take 30-60 min before first meal of the day and Pepcid (famotidine) 20 mg one bedtime until return GERD diet   02/16/2015 f/u ov/[name] re: asthma/ on singulair but no ICS Chief Complaint  Patient presents with  . Follow-up    Pt states that her breathing has returned to normal baseline. Her cough is much improved. No new co's today. She has not needed neb.  still finishing up prednisone, one more day left in taper  rec Continue  bisoprolol 5 mg daily  Continue  Singulair(montelukast)  10 mg daily  Only use your nebulizer  as a rescue medication Try prilosec 20mg   Take 30-60 min before first meal of the day and Pepcid (famotidine) 20 mg one bedtime(over the counter)  until return GERD diet    04/29/2015 f/u ov/[name] re: ashtma, obesity/ worse sob  Chief Complaint  Patient presents with  . Follow-up    Pt states having increased SOB for the past 3-4 days.  She gets out of breath walking up steps and sometimes just walking a flat surface. She also c/o some wheezing. She uses xopenex nebs 4-5 x per wk on average.     Onset was 9/18 noted couldn't get up usual  steps s sob / has not tried xopenex before exertion  but having hoarseness/wheezing and leg swelling which she says was about the same on her last ov though note she  said then her breathing was fine and did not report the breathing problem to Dr Lovena Le 9/20 and he found no problem with her pacemaker  >>add aldactone 25mg  daily  And [facility]    05/13/2015 NP Follow up : [name] /obesity /DCHF  Seen last ov with increased dyspnea and edema.  Aldactone was added to her regimen last ov and she was started on [facility].  'she is feeling much better today w/ less swellling .  Feels breathing is much better.  Wt is down 13lbs .  Remains on singulair   rec  Continue on current regimen  Low salt diet  Keep legs elevated.     06/25/2015  f/u ov/[name] re: asthma/ chf / severe obesity  Chief  Complaint  Patient presents with  . Follow-up    Pt states her breathing is unchanged. No new co's today.   She feels much better since starting Aldactone with acceptable potassium levels on her last visit  Not needing saba at all    No obvious day to day or daytime variability or assoc chronic cough or cp or chest tightness, subjective wheeze or overt sinus or hb symptoms. No unusual exp hx or h/o childhood pna/ asthma or knowledge of premature birth.  Sleeping ok without nocturnal  or early am exacerbation  of respiratory  c/o's or need for noct saba. Also denies any obvious fluctuation of symptoms with weather or environmental changes or other aggravating or alleviating factors except as outlined above   Current Medications, Allergies, Complete Past Medical History, Past Surgical History, Family History, and Social History were reviewed in [employer] record.  ROS  The following are not active complaints unless bolded sore throat, dysphagia, dental problems, itching, sneezing,  nasal congestion or excess/ purulent secretions, ear ache,   fever, chills, sweats, unintended wt loss, classically pleuritic or exertional cp, hemoptysis,  orthopnea pnd or leg swelling improved , presyncope, palpitations, abdominal pain, anorexia, nausea, vomiting, diarrhea  or change in bowel or bladder habits, change in stools or urine, dysuria,hematuria,  rash, arthralgias, visual complaints, headache, numbness, weakness or ataxia or problems with walking or coordination,  change in mood/affect or memory.              Objective:   Physical Exam  Massively obese bf nad  02/16/2015       273 >  04/29/2015   280  >267 05/13/2015 > 06/25/2015   270    Vital signs reviewed/ tachycardia noted on arrival but rate down to 80  on exam    HEENT: upper dentures/ lower ok / nl  turbinates, and orophanx. Nl external ear canals without cough reflex   NECK :  without JVD/Nodes/TM/ nl carotid  upstrokes bilaterally   LUNGS: no acc muscle use, clear to A and P bilaterally without cough on insp or exp maneuvers   CV:  RRR  no s3 or murmur or increase in P2,  Trace pitting bilateral lower ext   Edema   ABD:  soft and nontender with nl excursion in the supine position. No bruits or organomegaly, bowel sounds nl  MS:  warm without deformities, calf tenderness, cyanosis or clubbing  SKIN: warm and dry without lesions    NEURO:  alert, approp, no deficits        CXR:  04/29/2015  Did not go to cxr   Labs  reviewed:    Chemistry      Component Value Date/Time   NA 140 05/13/2015  1644   K 3.8 05/13/2015 1644   CL 104 05/13/2015 1644   CO2 29 05/13/2015 1644   BUN 16 05/13/2015 1644   CREATININE 1.07 05/13/2015 1644   CREATININE 1.08 05/05/2011 1536      Component Value Date/Time   CALCIUM 9.4 05/13/2015 1644   ALKPHOS 113 01/04/2015 1620   AST 27 01/04/2015 1620   ALT 16 01/04/2015 1620   BILITOT 0.6 01/04/2015 1620                          Assessment & Plan:   Outpatient Encounter Prescriptions as of 06/25/2015  Medication Sig  . bisoprolol (ZEBETA) 5 MG tablet Take 1 tablet (5 mg total) by mouth daily.  . famotidine (PEPCID) 20 MG tablet Take 20 mg by mouth at bedtime.  . furosemide (LASIX) 40 MG tablet Take 40 mg by mouth daily.  Marland Kitchen levalbuterol (XOPENEX) 0.63 MG/3ML nebulizer solution Take 0.63 mg by nebulization every 4 (four) hours as needed for wheezing or shortness of breath.  . mometasone-formoterol (DULERA) 100-5 MCG/ACT AERO Inhale 2 puffs into the lungs 2 (two) times daily.  . montelukast (SINGULAIR) 10 MG tablet TAKE ONE TABLET BY MOUTH ONCE DAILY AT BEDTIME EVERY  NIGHT  . omeprazole (PRILOSEC) 20 MG capsule Take 20 mg by mouth daily. Take 30-60 min before first meal of the day  . spironolactone (ALDACTONE) 25 MG tablet Take 1 tablet (25 mg total) by mouth daily.  Marland Kitchen warfarin (COUMADIN) 3 MG tablet Take as directed by coumadin clinic   No  facility-administered encounter medications on file as of 06/25/2015.

## 2015-06-26 NOTE — Assessment & Plan Note (Addendum)
-  spirometry 02/02/15 > restrictive only  - added back singulair 02/03/2015 >>>  - spirometry 04/29/2015 with low mid flows only  - 04/29/2015  extensive coaching HFA effectiveness =    90% > repeat spirometry at 10 m:  No change at all in mid flows   Not really clear there is much asthma here at all. However, if there is asthma, All goals of chronic asthma control met including optimal function and elimination of symptoms with minimal need for rescue therapy.  Contingencies discussed in full including contacting this office immediately if not controlling the symptoms using the rule of two's.     I had an extended summary final  discussion with the patient reviewing all relevant studies completed to date and  lasting 15 to 20 minutes of a 25 minute visit    Each maintenance medication was reviewed in detail including most importantly the difference between maintenance and prns and under what circumstances the prns are to be triggered using an action plan format that is not reflected in the computer generated alphabetically organized AVS.    Please see instructions for details which were reviewed in writing and the patient given a copy highlighting the part that I personally wrote and discussed at today's ov.   Pulmonary f/u can be prn

## 2015-06-26 NOTE — Assessment & Plan Note (Signed)
Body mass index is 41.61 kg/(m^2).  Lab Results  Component Value Date   TSH 1.36 04/29/2015     Contributing to gerd tendency/ doe and appears to be her greatest health challenge/reviewed the need and the process to achieve and maintain neg calorie balance > defer f/u primary care including intermittently monitoring thyroid status

## 2015-06-26 NOTE — Assessment & Plan Note (Addendum)
Echo 01/05/15 ok x Doppler parameters are consistent with abnormal left ventricular relaxation (grade 1 diastolic dysfunction). - Mitral valve: Calcified annulus. - Left atrium: The atrium was mildly dilated.  Changed coreg to bisorpol due to asthma 02/03/2015  Added aldactone 90/22/16 with marked improvement in edema tendency   Adequate control on present rx, reviewed > no change in rx needed

## 2015-06-26 NOTE — Assessment & Plan Note (Signed)
Spirometry 02/02/15 restrictive only  - 04/29/2015  Walked RA x 3 laps @ 185 ft each stopped due to  End of study, nl pace, no sob or desat    Obesity and deconditioning appear to be the greatest issues here> f/u primary care

## 2015-07-05 ENCOUNTER — Ambulatory Visit (INDEPENDENT_AMBULATORY_CARE_PROVIDER_SITE_OTHER): Payer: Medicare HMO | Admitting: *Deleted

## 2015-07-05 DIAGNOSIS — Z950 Presence of cardiac pacemaker: Secondary | ICD-10-CM | POA: Diagnosis not present

## 2015-07-05 DIAGNOSIS — Z5181 Encounter for therapeutic drug level monitoring: Secondary | ICD-10-CM

## 2015-07-05 DIAGNOSIS — I471 Supraventricular tachycardia: Secondary | ICD-10-CM

## 2015-07-05 LAB — POCT INR: INR: 1.9

## 2015-07-12 ENCOUNTER — Ambulatory Visit (INDEPENDENT_AMBULATORY_CARE_PROVIDER_SITE_OTHER): Payer: Medicare HMO | Admitting: *Deleted

## 2015-07-12 DIAGNOSIS — I471 Supraventricular tachycardia, unspecified: Secondary | ICD-10-CM

## 2015-07-12 DIAGNOSIS — Z950 Presence of cardiac pacemaker: Secondary | ICD-10-CM | POA: Diagnosis not present

## 2015-07-12 DIAGNOSIS — Z5181 Encounter for therapeutic drug level monitoring: Secondary | ICD-10-CM

## 2015-07-12 LAB — POCT INR: INR: 2.4

## 2015-07-15 ENCOUNTER — Encounter: Payer: Self-pay | Admitting: Internal Medicine

## 2015-07-26 ENCOUNTER — Ambulatory Visit (INDEPENDENT_AMBULATORY_CARE_PROVIDER_SITE_OTHER): Payer: Medicare HMO | Admitting: Pharmacist

## 2015-07-26 DIAGNOSIS — Z950 Presence of cardiac pacemaker: Secondary | ICD-10-CM

## 2015-07-26 DIAGNOSIS — I471 Supraventricular tachycardia: Secondary | ICD-10-CM | POA: Diagnosis not present

## 2015-07-26 DIAGNOSIS — Z5181 Encounter for therapeutic drug level monitoring: Secondary | ICD-10-CM

## 2015-07-26 LAB — POCT INR: INR: 3.3

## 2015-08-09 ENCOUNTER — Other Ambulatory Visit: Payer: Self-pay | Admitting: Internal Medicine

## 2015-08-19 ENCOUNTER — Ambulatory Visit (INDEPENDENT_AMBULATORY_CARE_PROVIDER_SITE_OTHER): Payer: Medicare HMO | Admitting: Pharmacist

## 2015-08-19 DIAGNOSIS — Z950 Presence of cardiac pacemaker: Secondary | ICD-10-CM

## 2015-08-19 DIAGNOSIS — Z5181 Encounter for therapeutic drug level monitoring: Secondary | ICD-10-CM

## 2015-08-19 DIAGNOSIS — I471 Supraventricular tachycardia: Secondary | ICD-10-CM | POA: Diagnosis not present

## 2015-08-19 LAB — POCT INR: INR: 2.8

## 2015-09-09 ENCOUNTER — Ambulatory Visit (INDEPENDENT_AMBULATORY_CARE_PROVIDER_SITE_OTHER): Payer: Medicare HMO

## 2015-09-09 DIAGNOSIS — Z5181 Encounter for therapeutic drug level monitoring: Secondary | ICD-10-CM | POA: Diagnosis not present

## 2015-09-09 DIAGNOSIS — I471 Supraventricular tachycardia: Secondary | ICD-10-CM

## 2015-09-09 DIAGNOSIS — Z950 Presence of cardiac pacemaker: Secondary | ICD-10-CM | POA: Diagnosis not present

## 2015-09-09 LAB — POCT INR: INR: 1.9

## 2015-10-07 ENCOUNTER — Ambulatory Visit (INDEPENDENT_AMBULATORY_CARE_PROVIDER_SITE_OTHER): Payer: Medicare HMO | Admitting: *Deleted

## 2015-10-07 DIAGNOSIS — Z950 Presence of cardiac pacemaker: Secondary | ICD-10-CM | POA: Diagnosis not present

## 2015-10-07 DIAGNOSIS — Z5181 Encounter for therapeutic drug level monitoring: Secondary | ICD-10-CM

## 2015-10-07 DIAGNOSIS — I471 Supraventricular tachycardia: Secondary | ICD-10-CM | POA: Diagnosis not present

## 2015-10-07 LAB — POCT INR: INR: 1.8

## 2015-10-21 ENCOUNTER — Ambulatory Visit (INDEPENDENT_AMBULATORY_CARE_PROVIDER_SITE_OTHER): Payer: Medicare HMO | Admitting: *Deleted

## 2015-10-21 DIAGNOSIS — Z950 Presence of cardiac pacemaker: Secondary | ICD-10-CM

## 2015-10-21 DIAGNOSIS — Z5181 Encounter for therapeutic drug level monitoring: Secondary | ICD-10-CM | POA: Diagnosis not present

## 2015-10-21 DIAGNOSIS — I471 Supraventricular tachycardia: Secondary | ICD-10-CM | POA: Diagnosis not present

## 2015-10-21 LAB — POCT INR: INR: 2.3

## 2015-11-03 ENCOUNTER — Encounter: Payer: Self-pay | Admitting: Internal Medicine

## 2015-11-11 ENCOUNTER — Other Ambulatory Visit: Payer: Self-pay | Admitting: Internal Medicine

## 2015-11-12 ENCOUNTER — Ambulatory Visit (INDEPENDENT_AMBULATORY_CARE_PROVIDER_SITE_OTHER): Payer: Medicare HMO | Admitting: *Deleted

## 2015-11-12 DIAGNOSIS — Z5181 Encounter for therapeutic drug level monitoring: Secondary | ICD-10-CM

## 2015-11-12 DIAGNOSIS — I471 Supraventricular tachycardia: Secondary | ICD-10-CM

## 2015-11-12 DIAGNOSIS — Z950 Presence of cardiac pacemaker: Secondary | ICD-10-CM | POA: Diagnosis not present

## 2015-11-12 LAB — POCT INR: INR: 1.3

## 2015-11-15 ENCOUNTER — Other Ambulatory Visit (INDEPENDENT_AMBULATORY_CARE_PROVIDER_SITE_OTHER): Payer: Medicare HMO

## 2015-11-15 ENCOUNTER — Ambulatory Visit (INDEPENDENT_AMBULATORY_CARE_PROVIDER_SITE_OTHER): Payer: Medicare HMO | Admitting: Internal Medicine

## 2015-11-15 ENCOUNTER — Encounter: Payer: Self-pay | Admitting: Internal Medicine

## 2015-11-15 VITALS — BP 136/72 | HR 103 | Temp 98.1°F | Ht 67.5 in | Wt 283.4 lb

## 2015-11-15 DIAGNOSIS — I10 Essential (primary) hypertension: Secondary | ICD-10-CM

## 2015-11-15 DIAGNOSIS — J4531 Mild persistent asthma with (acute) exacerbation: Secondary | ICD-10-CM | POA: Diagnosis not present

## 2015-11-15 DIAGNOSIS — R0600 Dyspnea, unspecified: Secondary | ICD-10-CM

## 2015-11-15 LAB — CBC WITH DIFFERENTIAL/PLATELET
Basophils Absolute: 0 10*3/uL (ref 0.0–0.1)
Basophils Relative: 0.3 % (ref 0.0–3.0)
EOS PCT: 1.9 % (ref 0.0–5.0)
Eosinophils Absolute: 0.2 10*3/uL (ref 0.0–0.7)
HCT: 39.5 % (ref 36.0–46.0)
HEMOGLOBIN: 13.1 g/dL (ref 12.0–15.0)
LYMPHS ABS: 1.7 10*3/uL (ref 0.7–4.0)
Lymphocytes Relative: 21.6 % (ref 12.0–46.0)
MCHC: 33.1 g/dL (ref 30.0–36.0)
MCV: 93.1 fl (ref 78.0–100.0)
MONOS PCT: 6.1 % (ref 3.0–12.0)
Monocytes Absolute: 0.5 10*3/uL (ref 0.1–1.0)
Neutro Abs: 5.5 10*3/uL (ref 1.4–7.7)
Neutrophils Relative %: 70.1 % (ref 43.0–77.0)
Platelets: 243 10*3/uL (ref 150.0–400.0)
RBC: 4.24 Mil/uL (ref 3.87–5.11)
RDW: 13.4 % (ref 11.5–15.5)
WBC: 7.9 10*3/uL (ref 4.0–10.5)

## 2015-11-15 LAB — BASIC METABOLIC PANEL
BUN: 9 mg/dL (ref 6–23)
CHLORIDE: 105 meq/L (ref 96–112)
CO2: 29 meq/L (ref 19–32)
Calcium: 9 mg/dL (ref 8.4–10.5)
Creatinine, Ser: 0.85 mg/dL (ref 0.40–1.20)
GFR: 83.4 mL/min (ref >60.00)
Glucose, Bld: 100 mg/dL — ABNORMAL HIGH (ref 70–99)
POTASSIUM: 3.7 meq/L (ref 3.5–5.1)
Sodium: 141 mEq/L (ref 135–145)

## 2015-11-15 LAB — TSH: TSH: 1.58 u[IU]/mL (ref 0.35–4.50)

## 2015-11-15 LAB — BRAIN NATRIURETIC PEPTIDE: PRO B NATRI PEPTIDE: 23 pg/mL (ref 0.0–100.0)

## 2015-11-15 NOTE — Patient Instructions (Addendum)
Please remember to go to the lab department downstairs for your tests - we will call you with the results when they are available.  Restart dulera 100 Take 2 puffs first thing in am and then another 2 puffs about 12 hours later.   See Tammy NP w/in 2 weeks (or next available after that)   with all your medications, even over the counter meds, separated in two separate bags, the ones you take no matter what vs the ones you stop once you feel better and take only as needed when you feel you need them.   Tammy  will generate for you a new user friendly medication calendar that will put [place] all on the same page re: your medication use.     Without this process, it simply isn't possible to assure that we are providing  your outpatient care  with  the attention to detail we feel you deserve.   If we cannot assure that you're getting that kind of care,  then we cannot manage your problem effectively from this clinic.  Once you have seen Tammy and we are sure that we're all on the same page with your medication use she will arrange follow up with me and refer you to primary care of your choice

## 2015-11-15 NOTE — Progress Notes (Signed)
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 No fever. Some chest tightness. She also has bilateral leg edema x 1 week. She has pacemaker insertion in 02/2011. She had Chest x-ray negative for pulmonary edema and her BNP was low at 45.7 pg/mL. Her echocardiogram had no wall motion abnormality with EF of 50 %. Her leg swelling improved with intermittent lasix use. Her respiratory function improved with antibiotic and bronchodilator along with solumedrol use. She was discharged home in stable condition with discontinuation of most anti-hypertensive medications and decrease of Coreg to 6.25 mg. twice daily. Her hyperglycemia resolved with diet. She will be followed by Dr. Montez Morita in 2 weeks and by me as needed.   02/02/2015 1st [facility] office visit/ [name]   Chief Complaint  Patient presents with  . Pulmonary Consult    Former pt of Dr. Annamaria Boots. Pt c/o increased SOB, esp at night for the past 5 wks. She also c/o increased cough-prod with white sputum.    back near baseline doe x long walk but still bothered by noct coughing min productive on xopenex and atrovent neb but no inhalers or ICS and def better while on systemic steroids now tapering  Off.  Does not remember why/ when stopped singulair   rec Stop carevidol Start bisoprolol 5 mg daily  Restart singulair 10 mg daily    Only use your nebulizer for rescue     Try prilosec 20mg   Take 30-60 min before first meal of the day and Pepcid (famotidine) 20 mg one bedtime until return GERD diet   02/16/2015 f/u ov/[name] re: asthma/ on singulair but no ICS Chief Complaint  Patient presents with  . Follow-up    Pt states that her breathing has returned to normal baseline. Her cough is much improved. No new co's today. She has not needed neb.  still finishing up prednisone, one more day left in taper  rec Continue  bisoprolol 5 mg daily  Continue  Singulair(montelukast)  10 mg daily  Only use your nebulizer  as a rescue medication Try prilosec 20mg   Take 30-60 min before first meal of the day and Pepcid (famotidine) 20 mg one bedtime(over the counter)  until return GERD diet    04/29/2015 f/u ov/[name] re: ashtma, obesity/ worse sob  Chief Complaint  Patient presents with  . Follow-up    Pt states having increased SOB for the past 3-4 days.  She gets out of breath walking up steps and sometimes just walking a flat surface. She also c/o some wheezing. She uses xopenex nebs 4-5 x per wk on average.     Onset was 9/18 noted couldn't get up usual  steps s sob / has not tried xopenex before exertion  but having hoarseness/wheezing and leg swelling which she says was about the same on her last ov  though note she said then her breathing was fine and did not report the breathing problem to Dr Lovena Le 9/20 and he found no problem with her pacemaker  >>add aldactone 25mg  daily  And [facility]    05/13/2015 NP Follow up : [name] /obesity /DCHF  Seen last ov with increased dyspnea and edema.  Aldactone was added to her regimen last ov and she was started on [facility].  'she is feeling much better today w/ less swellling .  Feels breathing is much better.  Wt is down 13lbs .  Remains on singulair   rec  Continue on current regimen  Low salt diet  Keep legs elevated.     06/25/2015  f/u ov/[name] re: asthma/ chf / severe obesity   Chief Complaint  Patient presents with  . Follow-up    Pt states her breathing is unchanged. No new co's today.   She feels much better since starting Aldactone with acceptable potassium levels on her last visit  Not needing saba at all  rec No change rx/ f/u prn   11/15/2015  f/u ov/[name] re: ? Asthma flare off dulera x one month Chief Complaint  Patient presents with  . 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 No unusual exp hx or h/o childhood pna/ asthma or knowledge of premature birth.  Sleeping ok without nocturnal  or early am exacerbation  of respiratory  c/o's or need for noct saba. Also denies any obvious fluctuation of symptoms with weather or environmental changes or other aggravating or alleviating factors except as outlined above   Current Medications, Allergies, Complete Past Medical History, Past Surgical History, Family History, and Social History were reviewed in [employer] record.  ROS  The following are not active complaints unless bolded sore throat, dysphagia, dental problems, itching, sneezing,  nasal congestion or excess/ purulent secretions, ear ache,   fever, chills, sweats, unintended wt loss, classically pleuritic or exertional cp, hemoptysis,  orthopnea pnd or leg swelling improved , presyncope, palpitations, abdominal pain, anorexia, nausea, vomiting, diarrhea  or change in bowel or bladder habits, change in stools or urine, dysuria,hematuria,  rash, arthralgias, visual complaints, headache, numbness, weakness or ataxia or problems with walking or coordination,  change in mood/affect or memory.               Objective:   Physical Exam  Massively obese bf nad extremely hoarse with pseudowheeze apparent/ vital signs reviewed   02/16/2015       273 >  04/29/2015   280  >267 05/13/2015 > 06/25/2015   270 > 11/15/2015   284         HEENT: upper dentures/ lower ok / nl  turbinates, and orophanx. Nl external ear canals without cough reflex   NECK :  without JVD/Nodes/TM/ nl carotid upstrokes bilaterally   LUNGS: no acc muscle use, more pseudowheeze than true wheeze though hard to be sure    CV:  RRR  no s3 or murmur or increase in P2,  Trace to 1+  pitting bilateral lower ext  Edema   ABD:  soft and nontender with nl excursion in the supine position. No bruits or organomegaly, bowel sounds nl  MS:  warm without deformities, calf tenderness, cyanosis or clubbing  SKIN: warm and dry without lesions    NEURO:  alert, approp, no deficits    Labs ordered/ reviewed:      Chemistry      Component Value Date/Time   NA 141 11/15/2015 1520   K 3.7 11/15/2015 1520   CL 105 11/15/2015 1520   CO2 29 11/15/2015 1520   BUN 9 11/15/2015 1520   CREATININE 0.85 11/15/2015 1520   CREATININE 1.08 05/05/2011 1536      Component Value Date/Time   CALCIUM 9.0 11/15/2015 1520   ALKPHOS 113 01/04/2015 1620   AST 27 01/04/2015 1620   ALT 16 01/04/2015 1620   BILITOT 0.6 01/04/2015 1620        Lab Results  Component Value Date   WBC 7.9 11/15/2015   HGB 13.1 11/15/2015   HCT 39.5 11/15/2015   MCV 93.1 11/15/2015   PLT 243.0 11/15/2015        Lab Results  Component Value Date   TSH 1.58 11/15/2015     Lab Results  Component Value Date   PROBNP 23.0 11/15/2015                         Assessment & Plan:

## 2015-11-16 ENCOUNTER — Other Ambulatory Visit: Payer: Self-pay | Admitting: Internal Medicine

## 2015-11-16 DIAGNOSIS — J4530 Mild persistent asthma, uncomplicated: Secondary | ICD-10-CM

## 2015-11-16 LAB — RESPIRATORY ALLERGY PROFILE REGION II ~~LOC~~
Allergen, Cedar tree, t12: 0.37 kU/L — ABNORMAL HIGH
Allergen, Cottonwood, t14: 0.12 kU/L — ABNORMAL HIGH
Allergen, D pternoyssinus,d7: 2.96 kU/L — ABNORMAL HIGH
Allergen, Mouse Urine Protein, e78: <0.1 kU/L
Alternaria Alternata: <0.1 kU/L
Aspergillus fumigatus, m3: <0.1 kU/L
Bermuda Grass: <0.1 kU/L
COCKROACH: 1.58 kU/L — AB
Cat Dander: <0.1 kU/L
Common Ragweed: <0.1 kU/L
D. farinae: 1.71 kU/L — ABNORMAL HIGH
Elm IgE: <0.1 kU/L
IGE (IMMUNOGLOBULIN E), SERUM: 651 kU/L — AB (ref <115)
Johnson Grass: 0.14 kU/L — ABNORMAL HIGH
Pecan/Hickory Tree IgE: 0.22 kU/L — ABNORMAL HIGH
Penicillium Notatum: <0.1 kU/L
ROUGH PIGWEED IGE: 0.21 kU/L — AB
Timothy Grass: <0.1 kU/L

## 2015-11-16 NOTE — Progress Notes (Signed)
Quick Note:  Spoke with pt and notified of results per Dr. Wert. Pt verbalized understanding and denied any questions.  ______ 

## 2015-11-17 NOTE — Assessment & Plan Note (Addendum)
-   spirometry 02/02/15 > restrictive only  - added back singulair 02/03/2015 >>>  - spirometry 04/29/2015 with low mid flows only  - 11/15/2015  After extensive coaching HFA effectiveness =    90% > resume dulera 100  - Allergy profile  11/15/15 >  Eos 0.2 /  IgE  651  Dust/roach > grass  DDX of  difficult airways management almost all start with A and  include Adherence, Ace Inhibitors, Acid Reflux, Active Sinus Disease, Alpha 1 Antitripsin deficiency, Anxiety masquerading as Airways dz,  ABPA,  Allergy(esp in young), Aspiration (esp in elderly), Adverse effects of meds,  Active smokers, A bunch of PE's (a small clot burden can't cause this syndrome unless there is already severe underlying pulm or vascular dz with poor reserve) plus two Bs  = Bronchiectasis and Beta blocker use..and one C= CHF  Adherence is always the initial "prime suspect" and is a multilayered concern that requires a "trust but verify" approach in every patient - starting with knowing how to use medications, especially inhalers, correctly, keeping up with refills and understanding the fundamental difference between maintenance and prns vs those medications only taken for a very short course and then stopped and not refilled.  - despite prev nurse, very shaky on details of meds/ refills / reviewed concept of med reconciliation.  To keep things simple, I have asked the patient to first separate medicines that are perceived as maintenance, that is to be taken daily "no matter what", from those medicines that are taken on only on an as-needed basis and I have given the patient examples of both, and then return to see our NP to generate a  detailed  medication calendar which should be followed until the next physician sees the patient and updates it.   - - The proper method of use, as well as anticipated side effects, of a metered-dose inhaler are discussed and demonstrated to the patient. Improved effectiveness after extensive coaching during  this visit to a level of approximately 90 % from a baseline of 75 %   ? Acid (or non-acid) GERD > always difficult to exclude as up to 75% of pts in some series report no assoc GI/ Heartburn symptoms> rec continue max (24h)  acid suppression and diet restrictions/ reviewed     Allergy > consider referral based on IgE if not well controlled on dulera and singulair  ? BB > unlikely on zebeta  ? CHF > excluded with such a low bnp  I had an extended discussion with the patient reviewing all relevant studies completed to date and  lasting 15 to 20 minutes of a 25 minute visit    Each maintenance medication was reviewed in detail including most importantly the difference between maintenance and prns and under what circumstances the prns are to be triggered using an action plan format that is not reflected in the computer generated alphabetically organized AVS.    Please see instructions for details which were reviewed in writing and the patient given a copy highlighting the part that I personally wrote and discussed at today's ov.

## 2015-11-17 NOTE — Assessment & Plan Note (Signed)
Multifactorial, may also have component of vcd dx of exclusion

## 2015-11-17 NOTE — Assessment & Plan Note (Signed)
Echo 01/05/15 ok x Doppler parameters are consistent with abnormal left ventricular relaxation (grade 1 diastolic dysfunction). - Mitral valve: Calcified annulus. - Left atrium: The atrium was mildly dilated.  Changed coreg to bisorpol due to asthma 02/03/2015  Added aldactone 04/29/15 with marked improvement in edema tendency    Adequate control on present rx, reviewed > no change in rx needed  > Follow up per Primary Care planned

## 2015-11-17 NOTE — Assessment & Plan Note (Signed)
Body mass index is 43.71 kg/(m^2).  Lab Results  Component Value Date   TSH 1.58 11/15/2015     Contributing to gerd tendency/ doe/reviewed the need and the process to achieve and maintain neg calorie balance > defer f/u primary care including intermittently monitoring thyroid status

## 2015-11-19 ENCOUNTER — Ambulatory Visit (INDEPENDENT_AMBULATORY_CARE_PROVIDER_SITE_OTHER): Payer: Medicare HMO

## 2015-11-19 DIAGNOSIS — Z5181 Encounter for therapeutic drug level monitoring: Secondary | ICD-10-CM

## 2015-11-19 DIAGNOSIS — I471 Supraventricular tachycardia: Secondary | ICD-10-CM | POA: Diagnosis not present

## 2015-11-19 DIAGNOSIS — Z950 Presence of cardiac pacemaker: Secondary | ICD-10-CM

## 2015-11-19 LAB — POCT INR: INR: 1.6

## 2015-11-29 ENCOUNTER — Encounter: Payer: Medicare HMO | Admitting: Adult Health

## 2015-12-03 ENCOUNTER — Ambulatory Visit (INDEPENDENT_AMBULATORY_CARE_PROVIDER_SITE_OTHER): Payer: Medicare HMO | Admitting: *Deleted

## 2015-12-03 DIAGNOSIS — Z5181 Encounter for therapeutic drug level monitoring: Secondary | ICD-10-CM

## 2015-12-03 DIAGNOSIS — Z950 Presence of cardiac pacemaker: Secondary | ICD-10-CM | POA: Diagnosis not present

## 2015-12-03 DIAGNOSIS — I471 Supraventricular tachycardia: Secondary | ICD-10-CM | POA: Diagnosis not present

## 2015-12-03 LAB — POCT INR: INR: 1.6

## 2015-12-07 ENCOUNTER — Ambulatory Visit: Payer: Medicare HMO | Admitting: Allergy and Immunology

## 2015-12-10 ENCOUNTER — Ambulatory Visit (INDEPENDENT_AMBULATORY_CARE_PROVIDER_SITE_OTHER): Payer: Medicare HMO | Admitting: Adult Health

## 2015-12-10 ENCOUNTER — Ambulatory Visit (INDEPENDENT_AMBULATORY_CARE_PROVIDER_SITE_OTHER)
Admission: RE | Admit: 2015-12-10 | Discharge: 2015-12-10 | Disposition: A | Discharge status: Home / Self Care | Payer: Medicare HMO | Source: Ambulatory Visit | Attending: Adult Health | Admitting: Adult Health

## 2015-12-10 ENCOUNTER — Ambulatory Visit
Admission: RE | Admit: 2015-12-10 | Discharge: 2015-12-10 | Disposition: A | Discharge status: Home / Self Care | Payer: Medicare HMO | Source: Ambulatory Visit | Attending: Internal Medicine | Admitting: Internal Medicine

## 2015-12-10 ENCOUNTER — Encounter: Payer: Self-pay | Admitting: Adult Health

## 2015-12-10 VITALS — BP 122/66 | HR 70 | Ht 67.5 in | Wt 278.4 lb

## 2015-12-10 DIAGNOSIS — J309 Allergic rhinitis, unspecified: Secondary | ICD-10-CM

## 2015-12-10 DIAGNOSIS — J4530 Mild persistent asthma, uncomplicated: Secondary | ICD-10-CM | POA: Diagnosis not present

## 2015-12-10 DIAGNOSIS — R0600 Dyspnea, unspecified: Secondary | ICD-10-CM

## 2015-12-10 MED ORDER — MONTELUKAST SODIUM 10 MG PO TABS: ORAL_TABLET | ORAL | Status: DC

## 2015-12-10 MED ORDER — MOMETASONE FURO-FORMOTEROL FUM 100-5 MCG/ACT IN AERO: 2.0000 | INHALATION_SPRAY | Freq: Two times a day (BID) | RESPIRATORY_TRACT | Status: DC

## 2015-12-10 NOTE — Assessment & Plan Note (Signed)
Recent flare off Dulera , improved with Dulera  Patient's medications were reviewed today and patient education was given. Computerized medication calendar was adjusted/completed   Plan  ollow med calendar closely and bring to each visit.  Continue on current regimen  Follow up with Allergist as planned  follow up Dr. Melvyn Novas  In 3 months and As needed

## 2015-12-10 NOTE — Assessment & Plan Note (Signed)
Elevated IgE and + rast  Follow up with Dr. Neldon Mc as planned

## 2015-12-10 NOTE — Progress Notes (Signed)
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 No fever. Some chest tightness. She also has bilateral leg edema x 1 week. She has pacemaker insertion in 02/2011. She had Chest x-ray negative for pulmonary edema and her BNP was low at 45.7 pg/mL. Her echocardiogram had no wall motion abnormality with EF of 50 %. Her leg swelling improved with intermittent lasix use. Her respiratory function improved with antibiotic and bronchodilator along with solumedrol use. She was discharged home in stable condition with discontinuation of most anti-hypertensive medications and decrease of Coreg to 6.25 mg. twice daily. Her hyperglycemia resolved with diet. She will be followed by Dr. Montez Morita in 2 weeks and by me as needed.   02/02/2015 1st [facility] office visit/ Wert   Chief Complaint  Patient presents with  . Pulmonary Consult    Former pt of Dr. Annamaria Boots. Pt c/o increased SOB, esp at night for the past 5 wks. She also c/o increased cough-prod with white sputum.    back near baseline doe x long walk but still bothered by noct coughing min productive on xopenex and atrovent neb but no inhalers or ICS and def better while on systemic steroids now tapering  Off.  Does not remember why/ when stopped singulair   rec Stop carevidol Start bisoprolol 5 mg daily  Restart singulair 10 mg daily    Only use your nebulizer for rescue     Try prilosec 20mg   Take 30-60 min before first meal of the day and Pepcid (famotidine) 20 mg one bedtime until return GERD diet   02/16/2015 f/u ov/Wert re: asthma/ on singulair but no ICS Chief Complaint  Patient presents with  . Follow-up    Pt states that her breathing has returned to normal baseline. Her cough is much improved. No new co's today. She has not needed neb.  still finishing up prednisone, one more day left in taper  rec Continue  bisoprolol 5 mg daily  Continue  Singulair(montelukast)  10 mg daily  Only use your nebulizer  as a rescue medication Try prilosec 20mg   Take 30-60 min before first meal of the day and Pepcid (famotidine) 20 mg one bedtime(over the counter)  until return GERD diet    04/29/2015 f/u ov/Wert re: ashtma, obesity/ worse sob  Chief Complaint  Patient presents with  . Follow-up    Pt states having increased SOB for the past 3-4 days.  She gets out of breath walking up steps and sometimes just walking a flat surface. She also c/o some wheezing. She uses xopenex nebs 4-5 x per wk on average.     Onset was 9/18 noted couldn't get up usual  steps s sob / has not tried xopenex before exertion  but having hoarseness/wheezing and leg swelling which she says was about the same on her last ov  though note she said then her breathing was fine and did not report the breathing problem to Dr Lovena Le 9/20 and he found no problem with her pacemaker  >>add aldactone 25mg  daily  And [facility]    05/13/2015 NP Follow up : Lynford Humphrey /obesity /DCHF  Seen last ov with increased dyspnea and edema.  Aldactone was added to her regimen last ov and she was started on [facility].  'she is feeling much better today w/ less swellling .  Feels breathing is much better.  Wt is down 13lbs .  Remains on singulair   rec  Continue on current regimen  Low salt diet  Keep legs elevated.     06/25/2015  f/u ov/Wert re: asthma/ chf / severe obesity   Chief Complaint  Patient presents with  . Follow-up    Pt states her breathing is unchanged. No new co's today.   She feels much better since starting Aldactone with acceptable potassium levels on her last visit  Not needing saba at all  rec No change rx/ f/u prn   11/15/2015  f/u ov/Wert re: ? Asthma flare off dulera x one month Chief Complaint  Patient presents with  . 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 Patient with asthma flare last visit with cough and shortness of breath. Patient was restarted on Dulera. Labs done last visit showed a normal CBC, TSH. IgE was elevated at 651 with positive RAST to cockroach, dust and grass. She was referred to Dr. Carmelina Peal, allergist.  She is feeling much better . Wheezing , cough and dyspnea are decreased.  Needs refill of singulair . Needs rx sent to [facility] to pharm .  Reviewed all her meds and organized them into a med calendar with pt educaiton .  Denies chest pain, orthopnea , edema or fever.    Current Medications, Allergies, Complete Past Medical History, Past Surgical History, Family History, and Social History were reviewed in [employer] record.  ROS  The following are not active complaints unless bolded sore throat, dysphagia, dental problems, itching, sneezing,  nasal congestion or excess/ purulent secretions, ear ache,   fever, chills, sweats, unintended wt loss, classically pleuritic or exertional cp, hemoptysis,  orthopnea pnd or  , presyncope, palpitations, abdominal pain, anorexia, nausea, vomiting, diarrhea  or change in bowel or bladder habits, change in stools or urine, dysuria,hematuria,  rash,  arthralgias, visual complaints, headache, numbness, weakness or ataxia or problems with walking or coordination,  change in mood/affect or memory.              Objective:   Physical Exam  Massively obese bf nad Filed Vitals:   12/10/15 1622  BP: 122/66  Pulse: 70  Height: 5' 7.5" (1.715 m)  Weight: 278 lb 6.4 oz (126.281 kg)  SpO2: 96%     02/16/2015       273 >  04/29/2015   280  >267 05/13/2015 > 06/25/2015   270 > 11/15/2015   284         HEENT: upper dentures/ lower ok / nl  turbinates, and orophanx. Nl external  ear canals without cough reflex   NECK :  without JVD/Nodes/TM/ nl carotid upstrokes bilaterally   LUNGS: no acc muscle use, decreased BS in bases    CV:  RRR  no s3 or murmur or increase in P2,  Trace to 1+  pitting bilateral lower ext   Edema   ABD:  soft and nontender with nl excursion in the supine position. No bruits or organomegaly, bowel sounds nl  MS:  warm without deformities, calf tenderness, cyanosis or clubbing  SKIN: warm and dry without lesions    NEURO:  alert, approp, no deficits    Labs ordered/ reviewed:      Chemistry      Component Value Date/Time   NA 141 11/15/2015 1520   K 3.7 11/15/2015 1520   CL 105 11/15/2015 1520   CO2 29 11/15/2015 1520   BUN 9 11/15/2015 1520   CREATININE 0.85 11/15/2015 1520   CREATININE 1.08 05/05/2011 1536      Component Value Date/Time   CALCIUM 9.0 11/15/2015 1520   ALKPHOS 113 01/04/2015 1620   AST 27 01/04/2015 1620   ALT 16 01/04/2015 1620   BILITOT 0.6 01/04/2015 1620        Lab Results  Component Value Date   WBC 7.9 11/15/2015   HGB 13.1 11/15/2015   HCT 39.5 11/15/2015   MCV 93.1 11/15/2015   PLT 243.0 11/15/2015        Lab Results  Component Value Date   TSH 1.58 11/15/2015     Lab Results  Component Value Date   PROBNP 23.0 11/15/2015     Leotis Isham NP-C  [facility]  12/10/2015                     Assessment & Plan:

## 2015-12-10 NOTE — Addendum Note (Signed)
Addended by: Osa Craver on: 12/10/2015 05:27 PM   Modules accepted: Orders

## 2015-12-10 NOTE — Progress Notes (Signed)
Chart and office note reviewed in detail  > agree with a/p as outlined    

## 2015-12-10 NOTE — Patient Instructions (Signed)
Follow med calendar closely and bring to each visit.  Continue on current regimen  Follow up with Allergist as planned  follow up Dr. Melvyn Novas  In 3 months and As needed

## 2015-12-10 NOTE — Addendum Note (Signed)
Addended by: Osa Craver on: 12/10/2015 05:05 PM   Modules accepted: Orders

## 2015-12-13 NOTE — Progress Notes (Signed)
Quick Note:  LVM for pt to return call ______ 

## 2015-12-13 NOTE — Addendum Note (Signed)
Addended by: Osa Craver on: 12/13/2015 03:48 PM   Modules accepted: Medications

## 2015-12-14 ENCOUNTER — Telehealth: Payer: Self-pay | Admitting: Adult Health

## 2015-12-14 NOTE — Telephone Encounter (Signed)
Result Notes     Notes Recorded by Osa Craver, CMA on 12/13/2015 at 4:40 PM LVM for pt to return call. ------  Notes Recorded by Melvenia Needles, NP on 12/13/2015 at 9:15 AM CXR is okay , does show old scarring in lung base on left.  No sign of PNA  Cont w/ ov recs Please contact office for sooner follow up if symptoms do not improve or worsen or seek emergency care    Spoke with pt. She is aware of her results. Nothing further was needed.

## 2015-12-17 ENCOUNTER — Ambulatory Visit (INDEPENDENT_AMBULATORY_CARE_PROVIDER_SITE_OTHER): Payer: Medicare HMO | Admitting: Pharmacist

## 2015-12-17 DIAGNOSIS — Z5181 Encounter for therapeutic drug level monitoring: Secondary | ICD-10-CM

## 2015-12-17 DIAGNOSIS — I471 Supraventricular tachycardia: Secondary | ICD-10-CM

## 2015-12-17 DIAGNOSIS — Z950 Presence of cardiac pacemaker: Secondary | ICD-10-CM | POA: Diagnosis not present

## 2015-12-17 LAB — POCT INR: INR: 1.4

## 2016-01-17 ENCOUNTER — Ambulatory Visit (INDEPENDENT_AMBULATORY_CARE_PROVIDER_SITE_OTHER): Payer: Medicare HMO | Admitting: *Deleted

## 2016-01-17 DIAGNOSIS — Z5181 Encounter for therapeutic drug level monitoring: Secondary | ICD-10-CM | POA: Diagnosis not present

## 2016-01-17 DIAGNOSIS — I471 Supraventricular tachycardia: Secondary | ICD-10-CM

## 2016-01-17 DIAGNOSIS — Z950 Presence of cardiac pacemaker: Secondary | ICD-10-CM

## 2016-01-17 LAB — POCT INR: INR: 2.1

## 2016-01-24 ENCOUNTER — Encounter: Payer: Self-pay | Admitting: Internal Medicine

## 2016-01-24 ENCOUNTER — Ambulatory Visit (INDEPENDENT_AMBULATORY_CARE_PROVIDER_SITE_OTHER): Payer: Medicare HMO | Admitting: *Deleted

## 2016-01-24 DIAGNOSIS — I442 Atrioventricular block, complete: Secondary | ICD-10-CM

## 2016-01-24 LAB — CUP PACEART INCLINIC DEVICE CHECK
Battery Remaining Longevity: 64.8
Battery Voltage: 2.9 V
Brady Statistic RA Percent Paced: 45 %
Brady Statistic RV Percent Paced: 99.86 %
Implantable Lead Implant Date: 20120702
Implantable Lead Location: 753859
Lead Channel Impedance Value: 387.5 Ohm
Lead Channel Impedance Value: 437.5 Ohm
Lead Channel Pacing Threshold Amplitude: 0.75 V
Lead Channel Pacing Threshold Pulse Width: 0.4 ms
Lead Channel Sensing Intrinsic Amplitude: 3.5 mV
Lead Channel Setting Pacing Amplitude: 2 V
Lead Channel Setting Pacing Amplitude: 2.5 V
MDC IDC LEAD IMPLANT DT: 20120702
MDC IDC LEAD LOCATION: 753860
MDC IDC MSMT LEADCHNL RV PACING THRESHOLD AMPLITUDE: 1 V
MDC IDC MSMT LEADCHNL RV PACING THRESHOLD PULSEWIDTH: 0.4 ms
MDC IDC MSMT LEADCHNL RV SENSING INTR AMPL: 3.5 mV
MDC IDC SESS DTM: 20170619171100
MDC IDC SET LEADCHNL RV PACING PULSEWIDTH: 0.5 ms
MDC IDC SET LEADCHNL RV SENSING SENSITIVITY: 4 mV
Pulse Gen Serial Number: 7254877

## 2016-01-24 NOTE — Progress Notes (Signed)
Pacemaker check in clinic. Normal device function. Thresholds, sensing, impedances consistent with previous measurements. Device programmed to maximize longevity. 1800 mode switches ( <1% burden) + Coumadin, all available EGMs show Aflutter vs Atach. Peak A 240, longest lasting 55min and 44sec. Device programmed at appropriate safety margins. Histogram distribution appropriate for patient activity level. Device programmed to optimize intrinsic conduction. Estimated longevity 5.3-5.34yrs. [name] with GT 9/27

## 2016-02-05 ENCOUNTER — Other Ambulatory Visit: Payer: Self-pay | Admitting: Internal Medicine

## 2016-02-21 ENCOUNTER — Ambulatory Visit (INDEPENDENT_AMBULATORY_CARE_PROVIDER_SITE_OTHER): Payer: Medicare HMO | Admitting: *Deleted

## 2016-02-21 DIAGNOSIS — Z950 Presence of cardiac pacemaker: Secondary | ICD-10-CM

## 2016-02-21 DIAGNOSIS — I471 Supraventricular tachycardia: Secondary | ICD-10-CM

## 2016-02-21 DIAGNOSIS — Z5181 Encounter for therapeutic drug level monitoring: Secondary | ICD-10-CM

## 2016-02-21 LAB — POCT INR: INR: 1.8

## 2016-02-28 ENCOUNTER — Ambulatory Visit (INDEPENDENT_AMBULATORY_CARE_PROVIDER_SITE_OTHER): Payer: Medicare HMO | Admitting: *Deleted

## 2016-02-28 DIAGNOSIS — I471 Supraventricular tachycardia: Secondary | ICD-10-CM

## 2016-02-28 DIAGNOSIS — Z5181 Encounter for therapeutic drug level monitoring: Secondary | ICD-10-CM

## 2016-02-28 DIAGNOSIS — Z950 Presence of cardiac pacemaker: Secondary | ICD-10-CM | POA: Diagnosis not present

## 2016-02-28 LAB — POCT INR: INR: 2.6

## 2016-03-13 ENCOUNTER — Encounter: Payer: Self-pay | Admitting: Internal Medicine

## 2016-03-13 ENCOUNTER — Ambulatory Visit (INDEPENDENT_AMBULATORY_CARE_PROVIDER_SITE_OTHER): Payer: Medicare HMO | Admitting: Internal Medicine

## 2016-03-13 VITALS — BP 140/78 | HR 110 | Ht 67.5 in | Wt 272.0 lb

## 2016-03-13 DIAGNOSIS — J4530 Mild persistent asthma, uncomplicated: Secondary | ICD-10-CM

## 2016-03-13 MED ORDER — MOMETASONE FURO-FORMOTEROL FUM 100-5 MCG/ACT IN AERO: 2.0000 | INHALATION_SPRAY | Freq: Two times a day (BID) | RESPIRATORY_TRACT | 0 refills | Status: DC

## 2016-03-13 NOTE — Patient Instructions (Addendum)
Start dulera 100 Take 2 puffs first thing in am and then another 2 puffs about 12 hours later.   Work on maintaining perfect  inhaler technique:  relax and gently blow all the way out then take a nice smooth deep breath back in, triggering the inhaler at same time you start breathing in.  Hold for up to 5 seconds if you can. Blow out thru nose. Rinse and gargle with water when done     [facility] to discontinue 02 at this point   Don't worry about the allergy evaluation for now - we need to get your medications straightened out first    See Tammy NP w/in 2 weeks or first available with all your medications, even over the counter meds, separated in two separate bags, the ones you take no matter what vs the ones you stop once you feel better and take only as needed when you feel you need them.   Tammy  will generate for you a new user friendly medication calendar that will put [place] all on the same page re: your medication use.     Without this process, it simply isn't possible to assure that we are providing  your outpatient care  with  the attention to detail we feel you deserve.   If we cannot assure that you're getting that kind of care,  then we cannot manage your problem effectively from this clinic.  Once you have seen Tammy and we are sure that we're all on the same page with your medication use she will arrange follow up with me.

## 2016-03-13 NOTE — Progress Notes (Signed)
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 No fever. Some chest tightness. She also has bilateral leg edema x 1 week. She has pacemaker insertion in 02/2011. She had Chest x-ray negative for pulmonary edema and her BNP was low at 45.7 pg/mL. Her echocardiogram had no wall motion abnormality with EF of 50 %. Her leg swelling improved with intermittent lasix use. Her respiratory function improved with antibiotic and bronchodilator along with solumedrol use. She was discharged home in stable condition with discontinuation of most anti-hypertensive medications and decrease of Coreg to 6.25 mg. twice daily. Her hyperglycemia resolved with diet. She will be followed by Dr. Montez Morita in 2 weeks and by me as needed.   02/02/2015 1st [facility] office visit/ W[facility]   Chief Complaint  Patient presents with  . Pulmonary Consult    Former pt of Dr. Annamaria Boots. Pt c/o increased SOB, esp at night for the past 5 wks. She also c/o increased cough-prod with white sputum.    back near baseline doe x long walk but still bothered by noct coughing min productive on xopenex and atrovent neb but no inhalers or ICS and def better while on systemic steroids now tapering  Off.  Does not remember why/ when stopped singulair   rec Stop carevidol Start bisoprolol 5 mg daily  Restart singulair 10 mg daily    Only use your nebulizer for rescue     Try prilosec 20mg   Take 30-60 min before first meal of the day and Pepcid (famotidine) 20 mg one bedtime until return GERD diet   02/16/2015 f/u ov/Wert re: asthma/ on singulair but no ICS Chief Complaint  Patient presents with  . Follow-up    Pt states that her breathing has returned to normal baseline. Her cough is much improved. No new co's today. She has not needed neb.  still finishing up prednisone, one more day left in taper  rec Continue  bisoprolol 5 mg daily  Continue  Singulair(montelukast)  10 mg daily  Only use your nebulizer  as a rescue medication Try prilosec 20mg   Take 30-60 min before first meal of the day and Pepcid (famotidine) 20 mg one bedtime(over the counter)  until return GERD diet    04/29/2015 f/u ov/Wert re: ashtma, obesity/ worse sob  Chief Complaint  Patient presents with  . Follow-up    Pt states having increased SOB for the past 3-4 days.  She gets out of breath walking up steps and sometimes just walking a flat surface. She also c/o some wheezing. She uses xopenex nebs 4-5 x per wk on average.     Onset was 9/18 noted couldn't get up usual  steps s sob / has not tried xopenex before exertion  but having hoarseness/wheezing and leg swelling which she says was about the same on her last ov  though note she said then her breathing was fine and did not report the breathing problem to Dr Lovena Le 9/20 and he found no problem with her pacemaker  >>add aldactone 25mg  daily  And [facility]    05/13/2015 NP Follow up : Lynford Humphrey /obesity /DCHF  Seen last ov with increased dyspnea and edema.  Aldactone was added to her regimen last ov and she was started on [facility].  'she is feeling much better today w/ less swellling .  Feels breathing is much better.  Wt is down 13lbs .  Remains on singulair   rec  Continue on current regimen  Low salt diet  Keep legs elevated.     06/25/2015  f/u ov/Wert re: asthma/ chf / severe obesity   Chief Complaint  Patient presents with  . Follow-up    Pt states her breathing is unchanged. No new co's today.   She feels much better since starting Aldactone with acceptable potassium levels on her last visit  Not needing saba at all  rec No change rx/ f/u prn   11/15/2015  f/u ov/Wert re: ? Asthma flare off dulera x one month Chief Complaint  Patient presents with  . 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 Restart dulera 100 Take 2 puffs first thing in am and then another 2 puffs about 12 hours later.  See Tammy NP w/in 2 weeks (or next available after that)   with all your medications    12/10/2015 NP Follow up : Asthma  Patient returns for 4 week follow-up. Patient with asthma flare last visit with cough and shortness of breath. Patient was restarted on Dulera. Labs done last visit showed a normal CBC, TSH. IgE was elevated at 651 with positive RAST to cockroach, dust and grass. She was referred to Dr. Carmelina Peal, allergist.  She is feeling much better . Wheezing , cough and dyspnea are decreased.  Needs refill of singulair . Needs rx sent to [facility] to pharm .  Reviewed all her meds and organized them into a med calendar with pt educaiton .  rec Follow med calendar closely and bring to each visit.  Continue on current regimen  Follow up with Allergist as planned     03/13/2016  Extended f/u ov/Wert re: asthma management/ severe obesity with prob gerd  Chief Complaint  Patient presents with  . Follow-up    Breathing is overall doing well. No new co's today.     Body mass index is 41.97 kg/m.    Confused again with meds/  instructions Does not have med calendar / most niights at hs c/o noct wheeze and then uses "nebulizer" and it goes away. Has xopenex listed as her neb but on further questioning she only has dulera and numbers on her sample do not match up (we did not give her more than 2 weeks of sample)  No obvious day to day or daytime variability or assoc excess/ purulent sputum or mucus plugs or hemoptysis or cp or chest tightness,  overt sinus or hb symptoms. No unusual exp hx or h/o  childhood pna/ asthma or knowledge of premature birth.  Sleeping ok without nocturnal  or early am exacerbation  of respiratory  c/o's or need for noct saba. Also denies any obvious fluctuation of symptoms with weather or environmental changes or other aggravating or alleviating factors except as outlined above   Current Medications, Allergies, Complete Past Medical History, Past Surgical History, Family History, and Social History were reviewed in [employer] record.  ROS  The following are not active complaints unless bolded sore throat, dysphagia, dental problems, itching, sneezing,  nasal congestion or excess/ purulent secretions, ear ache,   fever, chills, sweats, unintended wt loss, classically pleuritic or exertional cp,  orthopnea pnd or leg swelling, presyncope, palpitations, abdominal pain, anorexia, nausea, vomiting, diarrhea  or change in bowel or bladder habits, change in stools or urine, dysuria,hematuria,  rash, arthralgias, visual complaints, headache, numbness, weakness or ataxia or problems with walking or coordination,  change in mood/affect or memory.                      Objective:   Physical Exam  Massively obese bf nad   02/16/2015       273 >  04/29/2015   280  >267 05/13/2015 > 06/25/2015   270 > 11/15/2015   284  > 03/13/2016   273    Vital signs reviewed   HEENT: upper dentures/ lower ok / nl  turbinates, and orophanx. Nl external ear canals without cough reflex   NECK  :  without JVD/Nodes/TM/ nl carotid upstrokes bilaterally   LUNGS: no acc muscle use, decreased BS in bases - no wheeze     CV:  RRR  no s3 or murmur or increase in P2,   1+  pitting bilateral lower ext   Edema   ABD:  soft and nontender with nl excursion in the supine position. No bruits or organomegaly, bowel sounds nl  MS:  warm without deformities, calf tenderness, cyanosis or clubbing  SKIN: warm and dry without lesions    NEURO:  alert, approp, no deficits    Labs  reviewed:      Chemistry      Component Value Date/Time   NA 141 11/15/2015 1520   K 3.7 11/15/2015 1520   CL 105 11/15/2015 1520   CO2 29 11/15/2015 1520   BUN 9 11/15/2015 1520   CREATININE 0.85 11/15/2015 1520   CREATININE 1.08 05/05/2011 1536      Component Value Date/Time   CALCIUM 9.0 11/15/2015 1520   ALKPHOS 113 01/04/2015 1620   AST 27 01/04/2015 1620   ALT 16 01/04/2015 1620   BILITOT 0.6 01/04/2015 1620        Lab Results  Component Value Date   WBC 7.9 11/15/2015   HGB 13.1 11/15/2015   HCT 39.5 11/15/2015   MCV 93.1 11/15/2015   PLT 243.0 11/15/2015        Lab Results  Component Value Date   TSH 1.58 11/15/2015     Lab Results  Component Value Date   PROBNP 23.0 11/15/2015                      Assessment & Plan:

## 2016-03-14 ENCOUNTER — Encounter: Payer: Self-pay | Admitting: Internal Medicine

## 2016-03-14 NOTE — Assessment & Plan Note (Signed)
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 and may be difficult to overcome if she's not willing to meet [place] half way > warned today this puts her at high risk of Asthma M and M  I had an extended discussion with the patient reviewing all relevant studies completed to date and  lasting 25 minutes of a 40  minute extended office visit directed at improving asthma managment    Each maintenance medication was reviewed in detail including most importantly the difference between maintenance and prns and under what circumstances the prns are to be triggered using an action plan format that is not reflected in the computer generated alphabetically organized AVS but trather by a customized med calendar that reflects the AVS meds with confirmed 100% correlation.   Please see instructions for details which were reviewed in writing and the patient given a copy highlighting the part that I personally wrote and discussed at today's ov.

## 2016-03-14 NOTE — Assessment & Plan Note (Signed)
Body mass index is 41.97 kg/m.  Lab Results  Component Value Date   TSH 1.58 11/15/2015     Contributing to gerd tendency/ doe/reviewed the need and the process to achieve and maintain neg calorie balance > defer f/u primary care including intermittently monitoring thyroid status

## 2016-03-28 ENCOUNTER — Ambulatory Visit (INDEPENDENT_AMBULATORY_CARE_PROVIDER_SITE_OTHER): Payer: Medicare HMO | Admitting: Adult Health

## 2016-03-28 ENCOUNTER — Encounter: Payer: Self-pay | Admitting: Adult Health

## 2016-03-28 DIAGNOSIS — J4530 Mild persistent asthma, uncomplicated: Secondary | ICD-10-CM

## 2016-03-28 NOTE — Progress Notes (Signed)
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 No fever. Some chest tightness. She also has bilateral leg edema x 1 week. She has pacemaker insertion in 02/2011. She had Chest x-ray negative for pulmonary edema and her BNP was low at 45.7 pg/mL. Her echocardiogram had no wall motion abnormality with EF of 50 %. Her leg swelling improved with intermittent lasix use. Her respiratory function improved with antibiotic and bronchodilator along with solumedrol use. She was discharged home in stable condition with discontinuation of most anti-hypertensive medications and decrease of Coreg to 6.25 mg. twice daily. Her hyperglycemia resolved with diet. She will be followed by Dr. Montez Morita in 2 weeks and by me as needed.   02/02/2015 1st [facility] office visit/ W[facility]   Chief Complaint  Patient presents with  . Pulmonary Consult    Former pt of Dr. Annamaria Boots. Pt c/o increased SOB, esp at night for the past 5 wks. She also c/o increased cough-prod with white sputum.    back near baseline doe x long walk but still bothered by noct coughing min productive on xopenex and atrovent neb but no inhalers or ICS and def better while on systemic steroids now tapering  Off.  Does not remember why/ when stopped singulair   rec Stop carevidol Start bisoprolol 5 mg daily  Restart singulair 10 mg daily    Only use your nebulizer for rescue     Try prilosec 20mg   Take 30-60 min before first meal of the day and Pepcid (famotidine) 20 mg one bedtime until return GERD diet   02/16/2015 f/u ov/Wert re: asthma/ on singulair but no ICS Chief Complaint  Patient presents with  . Follow-up    Pt states that her breathing has returned to normal baseline. Her cough is much improved. No new co's today. She has not needed neb.  still finishing up prednisone, one more day left in taper  rec Continue  bisoprolol 5 mg daily  Continue  Singulair(montelukast)  10 mg daily  Only use your nebulizer  as a rescue medication Try prilosec 20mg   Take 30-60 min before first meal of the day and Pepcid (famotidine) 20 mg one bedtime(over the counter)  until return GERD diet    04/29/2015 f/u ov/Wert re: ashtma, obesity/ worse sob  Chief Complaint  Patient presents with  . Follow-up    Pt states having increased SOB for the past 3-4 days.  She gets out of breath walking up steps and sometimes just walking a flat surface. She also c/o some wheezing. She uses xopenex nebs 4-5 x per wk on average.     Onset was 9/18 noted couldn't get up usual  steps s sob / has not tried xopenex before exertion  but having hoarseness/wheezing and leg swelling which she says was about the same on her last ov  though note she said then her breathing was fine and did not report the breathing problem to Dr Lovena Le 9/20 and he found no problem with her pacemaker  >>add aldactone 25mg  daily  And [facility]    05/13/2015 NP Follow up : Lynford Humphrey /obesity /DCHF  Seen last ov with increased dyspnea and edema.  Aldactone was added to her regimen last ov and she was started on [facility].  'she is feeling much better today w/ less swellling .  Feels breathing is much better.  Wt is down 13lbs .  Remains on singulair   rec  Continue on current regimen  Low salt diet  Keep legs elevated.     06/25/2015  f/u ov/Wert re: asthma/ chf / severe obesity   Chief Complaint  Patient presents with  . Follow-up    Pt states her breathing is unchanged. No new co's today.   She feels much better since starting Aldactone with acceptable potassium levels on her last visit  Not needing saba at all  rec No change rx/ f/u prn   11/15/2015  f/u ov/Wert re: ? Asthma flare off dulera x one month Chief Complaint  Patient presents with  . 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 Restart dulera 100 Take 2 puffs first thing in am and then another 2 puffs about 12 hours later.  See Shawnna Pancake NP w/in 2 weeks (or next available after that)   with all your medications    12/10/2015 NP Follow up : Asthma  Patient returns for 4 week follow-up. Patient with asthma flare last visit with cough and shortness of breath. Patient was restarted on Dulera. Labs done last visit showed a normal CBC, TSH. IgE was elevated at 651 with positive RAST to cockroach, dust and grass. She was referred to Dr. Carmelina Peal, allergist.  She is feeling much better . Wheezing , cough and dyspnea are decreased.  Needs refill of singulair . Needs rx sent to [facility] to pharm .  Reviewed all her meds and organized them into a med calendar with pt educaiton .  rec Follow med calendar closely and bring to each visit.  Continue on current regimen  Follow up with Allergist as planned     03/13/2016  Extended f/u ov/Wert re: asthma management/ severe obesity with prob gerd  Chief Complaint  Patient presents with  . Follow-up    Breathing is overall doing well. No new co's today.     Body mass index is 41.97 kg/m.    Confused again with meds/  instructions Does not have med calendar / most niights at hs c/o noct wheeze and then uses "nebulizer" and it goes away. Has xopenex listed as her neb but on further questioning she only has dulera and numbers on her sample do not match up (we did not give her more than 2 weeks of sample) >>dulera rx   03/28/2016 Follow up : Asthma  Pt presents for a 2 week follow up and med review  We reviewed all her meds and organzied them into a med calendar with pt  education .  Appears to be taking correctly.  Says overall breathing is doing ok. Started on [place] last ov. Feels it is helping.  No chest pain, orthopnea, increased edema or fever.   We discussed Prevnar 13 vaccine , she would think about it till next visit.  Sister in law and brother in law passed away yesterday . Support provided.   Current Medications, Allergies, Complete Past Medical History  Past Surgical History, Family History, and Social History were reviewed in [employer] record.  ROS  The following are not active complaints unless bolded sore throat, dysphagia, dental problems, itching, sneezing,  nasal congestion or excess/ purulent secretions, ear ache,   fever, chills, sweats, unintended wt loss, classically pleuritic or exertional cp,  orthopnea pnd or leg swelling, presyncope, palpitations, abdominal pain, anorexia, nausea, vomiting, diarrhea  or change in bowel or bladder habits, change in stools or urine, dysuria,hematuria,  rash, arthralgias, visual complaints, headache, numbness, weakness or ataxia or problems with walking or coordination,  change in mood/affect or memory.                      Objective:   Physical Exam  Massively obese bf nad   02/16/2015       273 >  04/29/2015   280  >267 05/13/2015 > 06/25/2015   270 > 11/15/2015   284  > 03/13/2016   273    Vital signs reviewed   HEENT: upper dentures/ lower ok / nl  turbinates, and orophanx. Nl external ear canals without cough  reflex   NECK :  without JVD/Nodes/TM/ nl carotid upstrokes bilaterally   LUNGS: no acc muscle use, decreased BS in bases - no wheeze     CV:  RRR  no s3 or murmur or increase in P2,   Tr-1+  pitting bilateral lower ext   Edema   ABD:  soft and nontender with nl excursion in the supine position. No bruits or organomegaly, bowel sounds nl  MS:  warm without deformities, calf tenderness, cyanosis or clubbing  SKIN: warm and dry without lesions    NEURO:  alert, approp, no deficits    Labs  reviewed:      Chemistry      Component Value Date/Time   NA 141 11/15/2015 1520   K 3.7 11/15/2015 1520   CL 105 11/15/2015 1520   CO2 29 11/15/2015 1520   BUN 9 11/15/2015 1520   CREATININE 0.85 11/15/2015 1520   CREATININE 1.08 05/05/2011 1536      Component Value Date/Time   CALCIUM 9.0 11/15/2015 1520   ALKPHOS 113 01/04/2015 1620   AST 27 01/04/2015 1620   ALT 16 01/04/2015 1620   BILITOT 0.6 01/04/2015 1620        Lab Results  Component Value Date   WBC 7.9 11/15/2015   HGB 13.1 11/15/2015   HCT 39.5 11/15/2015   MCV 93.1 11/15/2015   PLT 243.0 11/15/2015        Lab Results  Component Value Date   TSH 1.58 11/15/2015     Lab Results  Component Value Date   PROBNP 23.0 11/15/2015                 Naryah Clenney NP-C  [facility]  03/28/2016

## 2016-03-28 NOTE — Patient Instructions (Addendum)
Follow med calendar closely and bring to each visit.  Continue on current regimen  Follow up Dr. Melvyn Novas  In 3 -70months and As needed   Look at [place] for next visit.

## 2016-03-29 ENCOUNTER — Ambulatory Visit (INDEPENDENT_AMBULATORY_CARE_PROVIDER_SITE_OTHER): Payer: Medicare HMO | Admitting: *Deleted

## 2016-03-29 DIAGNOSIS — Z950 Presence of cardiac pacemaker: Secondary | ICD-10-CM | POA: Diagnosis not present

## 2016-03-29 DIAGNOSIS — Z5181 Encounter for therapeutic drug level monitoring: Secondary | ICD-10-CM

## 2016-03-29 DIAGNOSIS — I471 Supraventricular tachycardia, unspecified: Secondary | ICD-10-CM

## 2016-03-29 LAB — POCT INR: INR: 1.4

## 2016-04-05 ENCOUNTER — Ambulatory Visit (INDEPENDENT_AMBULATORY_CARE_PROVIDER_SITE_OTHER): Payer: Medicare HMO | Admitting: *Deleted

## 2016-04-05 DIAGNOSIS — Z950 Presence of cardiac pacemaker: Secondary | ICD-10-CM | POA: Diagnosis not present

## 2016-04-05 DIAGNOSIS — Z5181 Encounter for therapeutic drug level monitoring: Secondary | ICD-10-CM

## 2016-04-05 DIAGNOSIS — I471 Supraventricular tachycardia: Secondary | ICD-10-CM

## 2016-04-05 LAB — POCT INR: INR: 1.4

## 2016-04-05 NOTE — Assessment & Plan Note (Signed)
Controlled on rx  Patient's medications were reviewed today and patient education was given. Computerized medication calendar was adjusted/completed   Plan  Patient Instructions  Follow med calendar closely and bring to each visit.  Continue on current regimen  Follow up Dr. Melvyn Novas  In 3 -67months and As needed   Look at [place] for next visit.

## 2016-04-13 ENCOUNTER — Ambulatory Visit (INDEPENDENT_AMBULATORY_CARE_PROVIDER_SITE_OTHER): Payer: Medicare HMO | Admitting: *Deleted

## 2016-04-13 DIAGNOSIS — Z950 Presence of cardiac pacemaker: Secondary | ICD-10-CM

## 2016-04-13 DIAGNOSIS — I471 Supraventricular tachycardia: Secondary | ICD-10-CM | POA: Diagnosis not present

## 2016-04-13 DIAGNOSIS — Z5181 Encounter for therapeutic drug level monitoring: Secondary | ICD-10-CM

## 2016-04-13 LAB — POCT INR: INR: 3.3

## 2016-05-03 ENCOUNTER — Encounter: Payer: Self-pay | Admitting: Internal Medicine

## 2016-05-03 ENCOUNTER — Ambulatory Visit (INDEPENDENT_AMBULATORY_CARE_PROVIDER_SITE_OTHER): Payer: Medicare HMO | Admitting: Internal Medicine

## 2016-05-03 ENCOUNTER — Ambulatory Visit (INDEPENDENT_AMBULATORY_CARE_PROVIDER_SITE_OTHER): Payer: Medicare HMO | Admitting: *Deleted

## 2016-05-03 VITALS — BP 122/64 | HR 91 | Ht 67.0 in | Wt 279.6 lb

## 2016-05-03 DIAGNOSIS — I471 Supraventricular tachycardia: Secondary | ICD-10-CM

## 2016-05-03 DIAGNOSIS — Z950 Presence of cardiac pacemaker: Secondary | ICD-10-CM

## 2016-05-03 DIAGNOSIS — Z5181 Encounter for therapeutic drug level monitoring: Secondary | ICD-10-CM

## 2016-05-03 DIAGNOSIS — I442 Atrioventricular block, complete: Secondary | ICD-10-CM | POA: Diagnosis not present

## 2016-05-03 LAB — CUP PACEART INCLINIC DEVICE CHECK
Battery Remaining Longevity: 64.8
Battery Voltage: 2.9 V
Brady Statistic RV Percent Paced: 99.43 %
Date Time Interrogation Session: 20170927195504
Implantable Lead Implant Date: 20120702
Implantable Lead Location: 753859
Lead Channel Impedance Value: 362.5 Ohm
Lead Channel Pacing Threshold Amplitude: 0.75 V
Lead Channel Pacing Threshold Amplitude: 1 V
Lead Channel Pacing Threshold Pulse Width: 0.4 ms
Lead Channel Sensing Intrinsic Amplitude: 3.4 mV
Lead Channel Setting Pacing Amplitude: 2.5 V
MDC IDC LEAD IMPLANT DT: 20120702
MDC IDC LEAD LOCATION: 753860
MDC IDC MSMT LEADCHNL RV IMPEDANCE VALUE: 412.5 Ohm
MDC IDC MSMT LEADCHNL RV PACING THRESHOLD PULSEWIDTH: 0.5 ms
MDC IDC MSMT LEADCHNL RV SENSING INTR AMPL: 9.9 mV
MDC IDC PG SERIAL: 7254877
MDC IDC SET LEADCHNL RA PACING AMPLITUDE: 2 V
MDC IDC SET LEADCHNL RV PACING PULSEWIDTH: 0.5 ms
MDC IDC SET LEADCHNL RV SENSING SENSITIVITY: 4 mV
MDC IDC STAT BRADY RA PERCENT PACED: 22 %
Pulse Gen Model: 2210

## 2016-05-03 LAB — POCT INR: INR: 1.5

## 2016-05-03 MED ORDER — WARFARIN SODIUM 3 MG PO TABS: 3.0000 mg | ORAL_TABLET | ORAL | 3 refills | Status: DC

## 2016-05-03 NOTE — Progress Notes (Signed)
HPI [name] returns today for followup. She is a very pleasant 75 year old woman with a history of obesity, hypertension, COPD, and symptomatic tachybradycardia syndrome. She is status post permanent pacemaker insertion. The patient denies chest pain or shortness of breath. She does note dietary indiscretion particularly with sodium and has had some problems with peripheral edema. She intermittently uses her Lasix. She has minimal dyspnea. She has been frustrated by her inability to lose weight. She admits to dietary indiscretion. She has been saddened by the recent loss of her sister who died after complications from hernia surgery. Allergies  Allergen Reactions  . Albuterol Other (See Comments)    irregular heartbeat     Current Outpatient Prescriptions  Medication Sig Dispense Refill  . bisoprolol (ZEBETA) 5 MG tablet TAKE ONE TABLET BY MOUTH ONCE DAILY 30 tablet 0  . famotidine (PEPCID) 20 MG tablet Take 20 mg by mouth at bedtime.    . furosemide (LASIX) 40 MG tablet Take 40 mg by mouth 2 (two) times daily.     . mometasone-formoterol (DULERA) 100-5 MCG/ACT AERO Inhale 2 puffs into the lungs 2 (two) times daily. 1 Inhaler 0  . montelukast (SINGULAIR) 10 MG tablet TAKE ONE TABLET BY MOUTH AT BEDTIME EVERY NIGHT 30 tablet 0  . omeprazole (PRILOSEC) 20 MG capsule Take 20 mg by mouth daily. Take 30-60 min before first meal of the day    . spironolactone (ALDACTONE) 25 MG tablet Take 1 tablet (25 mg total) by mouth daily. (Patient taking differently: Take 25 mg by mouth every morning. ) 30 tablet 11  . warfarin (COUMADIN) 3 MG tablet TAKE AS DIRECTED BY COUMADIN CLINIC 45 tablet 3   No current facility-administered medications for this visit.      Past Medical History:  Diagnosis Date  . Allergic rhinitis   . Anemia    hx due to med  . Arthritis   . Asthma   . CHF (congestive heart failure) ([place])   . Emphysema of lung ([place])   . Hyperlipidemia   . Hypertension   .  Pacemaker   . PAT (paroxysmal atrial tachycardia) ([place]) 2008  . Pneumonia    hx  . 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   Past Surgical History:  Procedure Laterality Date  . ABDOMINAL HYSTERECTOMY    . BREAST BIOPSY  04/30/2012   Procedure: BREAST BIOPSY WITH NEEDLE LOCALIZATION;  Surgeon: Rolm Bookbinder, MD;  Location: [facility];  Service: General;  Laterality: Left;  Left breast wire localization biospy  . HEMORRHOID SURGERY    . PACEMAKER INSERTION  02-06-2011  . TAH and BSO    . TONSILLECTOMY       Family History  Problem Relation Age of Onset  . Heart disease Mother   . Cancer Sister     breast     Social History   Social History  . Marital status: Widowed    Spouse name: N/A  . Number of children: 2  . Years of education: N/A   Occupational History  . cna    Social History Main Topics  . Smoking status: Former Smoker    Packs/day: 1.00    Years: 4.00    Types: Cigarettes    Quit date: 08/07/1977  . Smokeless tobacco: Never Used  . Alcohol use No  Comment: quit 1979  . Drug use: No  . Sexual activity: Not on file   Other Topics Concern  . Not on file   Social History Narrative   Pt has 11 siblings in all     BP 122/64   Pulse 91   Ht 5\' 7"  (1.702 m)   Wt 279 lb 9.6 oz (126.8 kg)   SpO2 97%   BMI 43.79 kg/m   Physical Exam:  obese appearing 75 year old woman, looking older than her stated age, NAD HEENT: Unremarkable Neck:  7 cm JVD, no thyromegally Back:  No CVA tenderness Lungs:  Clear with no wheezes, rales, or rhonchi. HEART:  Distant, Regular rate rhythm, no murmurs, no rubs, no clicks Abd:  soft, positive bowel sounds, no organomegally, no rebound, no guarding Ext:  2 plus pulses, 2+ peripheral edema, no cyanosis, no clubbing Skin:  No rashes no nodules Neuro:  CN II through XII intact, motor grossly intact  DEVICE  Normal device  function.  See PaceArt for details.   ECG - NSR with ventricular pacing Assess/Plan: 1. Chronic diastolic heart failure - she is volume overloaded and I have asked her to take additional lasix. Also to reduce her sodium intake. 2. HTN -her blood pressure is controlled. No change in meds. 3. Obesity - she admits to dietary indisrection as she has been depressed after her sister's death. 4. PPM - her [employer] DDD PM is working normally. Will follow.   Mikle Bosworth.d.

## 2016-05-03 NOTE — Patient Instructions (Addendum)
Medication Instructions:  TAKE Both of your Lasix pills as soon as you get home  Your physician recommends that you continue on your current medications as directed. Please refer to the Current Medication list given to you today.   Labwork: None Ordered   Testing/Procedures: None Ordered   Follow-Up: Your physician wants you to follow-up in: [facility] in 6 months.  You will receive a reminder letter in the mail two months in advance. If you don't receive a letter, please call our office to schedule the follow-up appointment.   Your physician wants you to follow-up in: 1 year with Dr. Lovena Le.  You will receive a reminder letter in the mail two months in advance. If you don't receive a letter, please call our office to schedule the follow-up appointment.   If you need a refill on your cardiac medications before your next appointment, please call your pharmacy.   Thank you for choosing [facility]! Christen Bame, RN [phone]

## 2016-05-05 ENCOUNTER — Other Ambulatory Visit: Payer: Self-pay | Admitting: Internal Medicine

## 2016-05-30 ENCOUNTER — Ambulatory Visit (INDEPENDENT_AMBULATORY_CARE_PROVIDER_SITE_OTHER): Payer: Medicare HMO | Admitting: Pharmacist

## 2016-05-30 DIAGNOSIS — Z5181 Encounter for therapeutic drug level monitoring: Secondary | ICD-10-CM | POA: Diagnosis not present

## 2016-05-30 DIAGNOSIS — Z950 Presence of cardiac pacemaker: Secondary | ICD-10-CM

## 2016-05-30 DIAGNOSIS — I471 Supraventricular tachycardia: Secondary | ICD-10-CM

## 2016-05-30 LAB — POCT INR: INR: 1.5

## 2016-06-06 ENCOUNTER — Ambulatory Visit (INDEPENDENT_AMBULATORY_CARE_PROVIDER_SITE_OTHER): Payer: Medicare HMO | Admitting: *Deleted

## 2016-06-06 DIAGNOSIS — I471 Supraventricular tachycardia, unspecified: Secondary | ICD-10-CM

## 2016-06-06 DIAGNOSIS — Z5181 Encounter for therapeutic drug level monitoring: Secondary | ICD-10-CM | POA: Diagnosis not present

## 2016-06-06 DIAGNOSIS — Z950 Presence of cardiac pacemaker: Secondary | ICD-10-CM

## 2016-06-06 LAB — POCT INR: INR: 2.6

## 2016-07-03 ENCOUNTER — Ambulatory Visit (INDEPENDENT_AMBULATORY_CARE_PROVIDER_SITE_OTHER): Payer: Medicare HMO | Admitting: Internal Medicine

## 2016-07-03 ENCOUNTER — Encounter: Payer: Self-pay | Admitting: Internal Medicine

## 2016-07-03 VITALS — BP 118/76 | HR 65 | Ht 67.5 in | Wt 286.0 lb

## 2016-07-03 DIAGNOSIS — I10 Essential (primary) hypertension: Secondary | ICD-10-CM

## 2016-07-03 DIAGNOSIS — J4530 Mild persistent asthma, uncomplicated: Secondary | ICD-10-CM

## 2016-07-03 MED ORDER — MOMETASONE FURO-FORMOTEROL FUM 200-5 MCG/ACT IN AERO: 2.0000 | INHALATION_SPRAY | Freq: Two times a day (BID) | RESPIRATORY_TRACT | 0 refills | Status: DC

## 2016-07-03 NOTE — Assessment & Plan Note (Signed)
-  spirometry 02/02/15 > restrictive only  - added back singulair 02/03/2015 >>>  - spirometry 04/29/2015 with low mid flows only  - 11/15/2015   resume dulera 100  - Allergy profile  11/15/15 >  Eos 0.2 /  IgE  651  Dust/roach > grass   -med calendar 12/10/2015 > did not recognize copy 03/13/2016 , 03/28/16  - 03/13/2016    re-try dulera 100 2bid - 07/03/2016  After extensive coaching HFA effectiveness =    90%  All goals of chronic asthma control met including optimal function and elimination of symptoms with minimal need for rescue therapy.  Contingencies discussed in full including contacting this office immediately if not controlling the symptoms using the rule of two's.     I had an extended discussion with the patient reviewing all relevant studies completed to date and  lasting 15 to 20 minutes of a 25 minute visit    Each maintenance medication was reviewed in detail including most importantly the difference between maintenance and prns and under what circumstances the prns are to be triggered using an action plan format that is not reflected in the computer generated alphabetically organized AVS but trather by a customized med calendar that reflects the AVS meds with confirmed 100% correlation.   Please see instructions for details which were reviewed in writing and the patient given a copy highlighting the part that I personally wrote and discussed at today's ov.

## 2016-07-03 NOTE — Patient Instructions (Addendum)
Check with Dr Montez Morita re your pneumonia shot - if never received Prevar 43 I recommend you have it   Follow the medication calendar as we reviewed it with you today   See Tammy NP @ 6 months with all your medications, even over the counter meds, separated in two separate bags, the ones you take no matter what vs the ones you stop once you feel better and take only as needed when you feel you need them.   Tammy  will generate for you a new user friendly medication calendar that will put [place] all on the same page re: your medication use.    See me in meantime as needed

## 2016-07-03 NOTE — Assessment & Plan Note (Signed)
Echo 01/05/15 ok x Doppler parameters are consistent with abnormal left ventricular relaxation (grade 1 diastolic dysfunction). - Mitral valve: Calcified annulus. - Left atrium: The atrium was mildly dilated.  Changed coreg to bisorpol due to asthma 02/03/2015  Added aldactone 04/29/15 with marked improvement in edema tendency    Adequate control on present rx, reviewed in detail with pt > no change in rx needed  > Follow up per Primary Care planned  / Dr Montez Morita

## 2016-07-03 NOTE — Assessment & Plan Note (Signed)
Body mass index is 44.13  Still trending up Lab Results  Component Value Date   TSH 1.58 11/15/2015     Contributing to gerd tendency/ doe/reviewed the need and the process to achieve and maintain neg calorie balance > defer f/u primary care including intermittently monitoring thyroid status

## 2016-07-03 NOTE — Progress Notes (Signed)
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 No fever. Some chest tightness. She also has bilateral leg edema x 1 week. She has pacemaker insertion in 02/2011. She had Chest x-ray negative for pulmonary edema and her BNP was low at 45.7 pg/mL. Her echocardiogram had no wall motion abnormality with EF of 50 %. Her leg swelling improved with intermittent lasix use. Her respiratory function improved with antibiotic and bronchodilator along with solumedrol use. She was discharged home in stable condition with discontinuation of most anti-hypertensive medications and decrease of Coreg to 6.25 mg. twice daily. Her hyperglycemia resolved with diet. She will be followed by Dr. Montez Farmer in 2 weeks and by me as needed.   02/02/2015 1st [facility] office visit/ [name]   Chief Complaint  Patient presents with  . Pulmonary Consult    Former pt of Dr. Annamaria Farmer. Pt c/o increased SOB, esp at night for the past 5 wks. She also c/o increased cough-prod with white sputum.    back near baseline doe x long walk but still bothered by noct coughing min productive on xopenex and atrovent neb but no inhalers or ICS and def better while on systemic steroids now tapering  Off.  Does not remember why/ when stopped singulair   rec Stop carevidol Start bisoprolol 5 mg daily  Restart singulair 10 mg daily    Only use your nebulizer for rescue     Try prilosec 20mg   Take 30-60 min before first meal of the day and Pepcid (famotidine) 20 mg one bedtime until return GERD diet   02/16/2015 f/u ov/[name] re: asthma/ on singulair but no ICS Chief Complaint  Patient presents with  . Follow-up    Pt states that her breathing has returned to normal baseline. Her cough is much improved. No new co's today. She has not needed neb.  still finishing up prednisone, one more day left in taper  rec Continue  bisoprolol 5 mg daily  Continue  Singulair(montelukast)  10 mg daily  Only use your nebulizer  as a rescue medication Try prilosec 20mg   Take 30-60 min before first meal of the day and Pepcid (famotidine) 20 mg one bedtime(over the counter)  until return GERD diet    04/29/2015 f/u ov/[name] re: ashtma, obesity/ worse sob  Chief Complaint  Patient presents with  . Follow-up    Pt states having increased SOB for the past 3-4 days.  She gets out of breath walking up steps and sometimes just walking a flat surface. She also c/o some wheezing. She uses xopenex nebs 4-5 x per wk on average.    Onset was 9/18 noted couldn't get up usual  steps s sob / has not tried xopenex before exertion  but having hoarseness/wheezing and leg swelling which she says was about the same on her last ov though  note she said then her breathing was fine and did not report the breathing problem to Dr [name] 9/20 and he found no problem with her pacemaker  >>add aldactone 25mg  daily  And [facility]    05/13/2015 NP Follow up : [name] /obesity /DCHF  Seen last ov with increased dyspnea and edema.  Aldactone was added to her regimen last ov and she was started on [facility].  'she is feeling much better today w/ less swellling .  Feels breathing is much better.  Wt is down 13lbs .  Remains on singulair   rec  Continue on current regimen  Low salt diet  Keep legs elevated.    11/15/2015  f/u ov/[name] re: ? Asthma flare off dulera x one  month Chief Complaint  Patient presents with  . 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 Restart dulera 100 Take 2 puffs first thing in am and then another 2 puffs about 12 hours later.  See Tammy NP w/in 2 weeks (or next available after that)   with all your medications    12/10/2015 NP Follow up : Asthma  Patient returns for 4 week follow-up. Patient with asthma flare last visit with cough and shortness of breath. Patient was restarted on Dulera. Labs done last visit showed a normal CBC, TSH. IgE was elevated at 651 with positive RAST to cockroach, dust and grass. She was referred to [name], allergist.  She is feeling much better . Wheezing , cough and dyspnea are decreased.  Needs refill of singulair . Needs rx sent to [facility] to pharm .  Reviewed all her meds and organized them into a med calendar with pt educaiton .  rec Follow med calendar closely and bring to each visit.  Continue on current regimen  Follow up with Allergist as planned     03/13/2016  Extended f/u ov/[name] re: asthma management/ severe obesity with prob gerd  Chief Complaint  Patient presents with  . Follow-up    Breathing is overall doing well. No new co's today.     Body mass index is 41.97 kg/m.    Confused again with meds/ instructions Does not have med calendar / most nights at hs c/o noct wheeze and then uses "nebulizer" and it goes away. Has xopenex listed as her neb but on further questioning she only has dulera and numbers on her sample do not match up (we did not give her more than 2 weeks of sample) >>[id number]  rx   03/28/2016 NP  Follow up : Asthma    Pt presents for a 2 week follow up and med review  We reviewed all her meds and organzied them into a med calendar with pt education .  Appears to be taking correctly.  Says overall breathing is doing ok. Started on [place] last ov. Feels it is helping.  No chest pain, orthopnea, increased edema or fever.  We discussed Prevnar 13 vaccine , she would think about it till next visit.  rec Follow med calendar closely and bring to each visit.  Continue on current  regimen     07/03/2016  f/u ov/[name] re:  Mild asthma maint rx singulair/dulera 100 no longer using neb saba at all   No med calendar   No obvious day to day or daytime variability or assoc excess/ purulent sputum or mucus plugs or hemoptysis or cp or chest tightness, subjective wheeze or overt sinus or hb symptoms. No unusual exp hx or h/o childhood pna/ asthma or knowledge of premature birth.  Sleeping ok without nocturnal  or early am exacerbation  of respiratory  c/o's or need for noct saba. Also denies any obvious fluctuation of symptoms with weather or environmental changes or other aggravating or alleviating factors except as outlined above   Current Medications, Allergies, Complete Past Medical History, Past Surgical History, Family History, and Social History were reviewed in [employer] record.  ROS  The following are not active complaints unless bolded sore throat, dysphagia, dental problems, itching, sneezing,  nasal congestion or excess/ purulent secretions, ear ache,   fever, chills, sweats, unintended wt loss, classically pleuritic or exertional cp,  orthopnea pnd or leg swelling, presyncope, palpitations, abdominal pain, anorexia, nausea, vomiting, diarrhea  or change in bowel or bladder habits, change in stools or urine, dysuria,hematuria,  rash, arthralgias, visual complaints, headache, numbness, weakness or ataxia or problems with walking or coordination,  change in mood/affect or memory.                       Objective:   Physical Exam  Massively obese bf nad   02/16/2015       273 >  04/29/2015   280  >267 05/13/2015 > 06/25/2015   270 > 11/15/2015   284  > 03/13/2016   273 > 07/03/2016 286    Vital signs reviewed  - Note on arrival 02 sats  96% on RA     HEENT: upper dentures/ lower ok / nl  turbinates, and orophanx. Nl external ear canals without cough reflex   NECK :  without JVD/Nodes/TM/ nl carotid upstrokes bilaterally   LUNGS: no acc muscle use, decreased BS in bases - no wheeze     CV:  RRR  no s3 or murmur or increase in P2,    1+  pitting bilateral lower ext   Edema   ABD:  soft and nontender with nl excursion in the supine position. No bruits or organomegaly, bowel sounds nl  MS:  warm without deformities, calf tenderness, cyanosis or clubbing  SKIN: warm and dry without lesions    NEURO:  alert, approp, no deficits    Outpatient Encounter Prescriptions as of 07/03/2016  Medication Sig  . bisoprolol (ZEBETA) 5 MG tablet TAKE ONE TABLET BY MOUTH ONCE DAILY  . Bromfenac Sodium (PROLENSA) 0.07 % SOLN Apply to eye as directed.  . Difluprednate (DUREZOL) 0.05 % EMUL Apply to eye as directed.  . famotidine (PEPCID) 20 MG tablet Take 20 mg by mouth at bedtime.  . furosemide (LASIX) 40 MG tablet Take 40 mg by mouth 2 (two) times daily.   . mometasone-formoterol (DULERA) 100-5 MCG/ACT AERO Inhale 2 puffs into the lungs 2 (two) times daily.  . montelukast (SINGULAIR) 10 MG tablet TAKE ONE TABLET BY MOUTH AT BEDTIME EVERY NIGHT  . omeprazole (PRILOSEC) 20 MG capsule Take 20 mg by mouth daily. Take 30-60 min before first meal of the day  . Propylene Glycol-Glycerin (SOOTHE) 0.6-0.6 % SOLN Apply to eye as directed.  [place] spironolactone (ALDACTONE) 25 MG tablet  TAKE ONE TABLET BY MOUTH ONCE DAILY  . warfarin (COUMADIN) 3 MG tablet Take 1 tablet (3 mg total) by mouth as directed.  . [DISCONTINUED] mometasone-formoterol (DULERA) 200-5 MCG/ACT AERO Inhale 2 puffs  into the lungs 2 (two) times daily.   No facility-administered encounter medications on file as of 07/03/2016.

## 2016-07-13 ENCOUNTER — Ambulatory Visit (INDEPENDENT_AMBULATORY_CARE_PROVIDER_SITE_OTHER): Payer: Medicare HMO | Admitting: Pharmacist

## 2016-07-13 DIAGNOSIS — I471 Supraventricular tachycardia: Secondary | ICD-10-CM | POA: Diagnosis not present

## 2016-07-13 DIAGNOSIS — Z950 Presence of cardiac pacemaker: Secondary | ICD-10-CM | POA: Diagnosis not present

## 2016-07-13 DIAGNOSIS — Z5181 Encounter for therapeutic drug level monitoring: Secondary | ICD-10-CM | POA: Diagnosis not present

## 2016-07-13 LAB — POCT INR: INR: 2.6

## 2016-08-16 ENCOUNTER — Ambulatory Visit (INDEPENDENT_AMBULATORY_CARE_PROVIDER_SITE_OTHER): Payer: Medicare HMO | Admitting: *Deleted

## 2016-08-16 DIAGNOSIS — Z950 Presence of cardiac pacemaker: Secondary | ICD-10-CM | POA: Diagnosis not present

## 2016-08-16 DIAGNOSIS — I471 Supraventricular tachycardia: Secondary | ICD-10-CM

## 2016-08-16 DIAGNOSIS — Z5181 Encounter for therapeutic drug level monitoring: Secondary | ICD-10-CM

## 2016-08-16 LAB — POCT INR: INR: 4.4

## 2016-08-28 ENCOUNTER — Ambulatory Visit (INDEPENDENT_AMBULATORY_CARE_PROVIDER_SITE_OTHER): Payer: Medicare HMO

## 2016-08-28 DIAGNOSIS — Z5181 Encounter for therapeutic drug level monitoring: Secondary | ICD-10-CM | POA: Diagnosis not present

## 2016-08-28 DIAGNOSIS — I471 Supraventricular tachycardia: Secondary | ICD-10-CM

## 2016-08-28 DIAGNOSIS — Z950 Presence of cardiac pacemaker: Secondary | ICD-10-CM

## 2016-08-28 LAB — POCT INR: INR: 4.9

## 2016-09-07 ENCOUNTER — Ambulatory Visit (INDEPENDENT_AMBULATORY_CARE_PROVIDER_SITE_OTHER): Payer: Medicare HMO | Admitting: *Deleted

## 2016-09-07 DIAGNOSIS — I471 Supraventricular tachycardia: Secondary | ICD-10-CM

## 2016-09-07 DIAGNOSIS — Z5181 Encounter for therapeutic drug level monitoring: Secondary | ICD-10-CM

## 2016-09-07 DIAGNOSIS — Z950 Presence of cardiac pacemaker: Secondary | ICD-10-CM

## 2016-09-07 LAB — POCT INR: INR: 3.9

## 2016-09-18 ENCOUNTER — Ambulatory Visit (INDEPENDENT_AMBULATORY_CARE_PROVIDER_SITE_OTHER): Payer: Medicare HMO | Admitting: *Deleted

## 2016-09-18 DIAGNOSIS — Z5181 Encounter for therapeutic drug level monitoring: Secondary | ICD-10-CM | POA: Diagnosis not present

## 2016-09-18 DIAGNOSIS — Z950 Presence of cardiac pacemaker: Secondary | ICD-10-CM

## 2016-09-18 DIAGNOSIS — I471 Supraventricular tachycardia: Secondary | ICD-10-CM

## 2016-09-18 LAB — POCT INR: INR: 2.1

## 2016-10-09 ENCOUNTER — Ambulatory Visit (INDEPENDENT_AMBULATORY_CARE_PROVIDER_SITE_OTHER): Payer: Medicare HMO | Admitting: *Deleted

## 2016-10-09 DIAGNOSIS — I471 Supraventricular tachycardia: Secondary | ICD-10-CM

## 2016-10-09 DIAGNOSIS — Z950 Presence of cardiac pacemaker: Secondary | ICD-10-CM | POA: Diagnosis not present

## 2016-10-09 DIAGNOSIS — Z5181 Encounter for therapeutic drug level monitoring: Secondary | ICD-10-CM | POA: Diagnosis not present

## 2016-10-09 LAB — POCT INR: INR: 2.6

## 2016-10-15 ENCOUNTER — Other Ambulatory Visit: Payer: Self-pay | Admitting: Internal Medicine

## 2016-10-20 ENCOUNTER — Telehealth: Payer: Self-pay | Admitting: Internal Medicine

## 2016-10-20 NOTE — Telephone Encounter (Signed)
Pt called stating was out of coumadin and she has not had any coumadin Sunday Monday Tuesday Wednesday but she did take 1 and 1/2 tablets (4.5mg ) on Thursday This nurse called the pharmacy and they do have her refill there for coumadin that was sent in on March 12 and is ready for her to pick up. This nurse instructed pt to get her refill today and take coumadin 2 tablets today ( 6mg )  and 2 tablets  (6mg ) tomorrow and then continue same dose of coumadin and made an appt for her to be seen in coumadin clinic on Friday March 23rd  Pt verbalizes understanding

## 2016-10-20 NOTE — Telephone Encounter (Signed)
Patient is currently out of her warfarin medication, and would like to verify if she needs to have it refilled. Please call, thanks.

## 2016-10-24 ENCOUNTER — Encounter: Payer: Self-pay | Admitting: Internal Medicine

## 2016-10-24 ENCOUNTER — Encounter (INDEPENDENT_AMBULATORY_CARE_PROVIDER_SITE_OTHER): Payer: Self-pay

## 2016-10-24 ENCOUNTER — Ambulatory Visit (INDEPENDENT_AMBULATORY_CARE_PROVIDER_SITE_OTHER): Payer: Medicare HMO | Admitting: Internal Medicine

## 2016-10-24 DIAGNOSIS — I442 Atrioventricular block, complete: Secondary | ICD-10-CM | POA: Diagnosis not present

## 2016-10-24 LAB — CUP PACEART INCLINIC DEVICE CHECK
Battery Voltage: 2.89 V
Brady Statistic RA Percent Paced: 22 %
Implantable Lead Implant Date: 20120702
Implantable Lead Location: 753859
Implantable Lead Location: 753860
Lead Channel Impedance Value: 375 Ohm
Lead Channel Pacing Threshold Amplitude: 0.75 V
Lead Channel Pacing Threshold Amplitude: 1 V
Lead Channel Pacing Threshold Pulse Width: 0.4 ms
Lead Channel Pacing Threshold Pulse Width: 0.5 ms
Lead Channel Pacing Threshold Pulse Width: 0.5 ms
Lead Channel Sensing Intrinsic Amplitude: 3.4 mV
Lead Channel Setting Pacing Amplitude: 2 V
Lead Channel Setting Pacing Amplitude: 2.5 V
MDC IDC LEAD IMPLANT DT: 20120702
MDC IDC MSMT LEADCHNL RA PACING THRESHOLD AMPLITUDE: 0.75 V
MDC IDC MSMT LEADCHNL RA PACING THRESHOLD PULSEWIDTH: 0.4 ms
MDC IDC MSMT LEADCHNL RV IMPEDANCE VALUE: 400 Ohm
MDC IDC MSMT LEADCHNL RV PACING THRESHOLD AMPLITUDE: 1 V
MDC IDC MSMT LEADCHNL RV SENSING INTR AMPL: 9.9 mV
MDC IDC PG IMPLANT DT: 20120702
MDC IDC PG SERIAL: 7254877
MDC IDC SESS DTM: 20180320171806
MDC IDC SET LEADCHNL RV PACING PULSEWIDTH: 0.5 ms
MDC IDC SET LEADCHNL RV SENSING SENSITIVITY: 4 mV
MDC IDC STAT BRADY RV PERCENT PACED: 99.73 %

## 2016-10-24 NOTE — Patient Instructions (Signed)
Medication Instructions:  Your physician recommends that you continue on your current medications as directed. Please refer to the Current Medication list given to you today.   Labwork: None ordered  Testing/Procedures: None ordered  Follow-Up: Your physician wants you to follow-up in: 12 months with Dr. Lovena Le. You will receive a reminder letter in the mail two months in advance. If you don't receive a letter, please call our office to schedule the follow-up appointment.  Your physician wants you to follow-up in: 6 months with the device clinic. You will receive a reminder letter in the mail two months in advance. If you don't receive a letter, please call our office to schedule the follow-up appointment.   Any Other Special Instructions Will Be Listed Below (If Applicable).     If you need a refill on your cardiac medications before your next appointment, please call your pharmacy.

## 2016-10-24 NOTE — Progress Notes (Signed)
HPI [name] returns today for followup. She is a very pleasant 76 year old woman with a history of obesity, hypertension, diastolic CHF, COPD, and symptomatic tachybradycardia syndrome. She is status post permanent pacemaker insertion. The patient denies chest pain or shortness of breath. She does note dietary indiscretion particularly with sodium and has had some problems with peripheral edema. She intermittently uses her Lasix. She has minimal dyspnea. She has been frustrated by her inability to lose weight. She admits to dietary indiscretion.  Allergies  Allergen Reactions  . Albuterol Other (See Comments)    irregular heartbeat     Current Outpatient Prescriptions  Medication Sig Dispense Refill  . bisoprolol (ZEBETA) 5 MG tablet TAKE ONE TABLET BY MOUTH ONCE DAILY 30 tablet 0  . Bromfenac Sodium (PROLENSA) 0.07 % SOLN Apply to eye as directed.    . furosemide (LASIX) 40 MG tablet Take 40 mg by mouth 2 (two) times daily.     . mometasone-formoterol (DULERA) 100-5 MCG/ACT AERO Inhale 2 puffs into the lungs 2 (two) times daily. 1 Inhaler 0  . montelukast (SINGULAIR) 10 MG tablet TAKE ONE TABLET BY MOUTH AT BEDTIME EVERY NIGHT 30 tablet 0  . omeprazole (PRILOSEC) 20 MG capsule Take 20 mg by mouth daily. Take 30-60 min before first meal of the day    . Propylene Glycol-Glycerin (SOOTHE) 0.6-0.6 % SOLN Apply to eye as directed.    Marland Kitchen spironolactone (ALDACTONE) 25 MG tablet TAKE ONE TABLET BY MOUTH ONCE DAILY 30 tablet 1  . valsartan (DIOVAN) 160 MG tablet Take 160 mg by mouth daily.    Marland Kitchen warfarin (COUMADIN) 3 MG tablet TAKE ONE TABLET BY MOUTH  AS DIRECTED 60 tablet 3   No current facility-administered medications for this visit.      Past Medical History:  Diagnosis Date  . Allergic rhinitis   . Anemia    hx due to med  . Arthritis   . Asthma   . CHF (congestive heart failure) ([place])   . Emphysema of lung ([place])   . Hyperlipidemia   . Hypertension   . Pacemaker   . PAT  (paroxysmal atrial tachycardia) ([place]) 2008  . Pneumonia    hx  . 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   Past Surgical History:  Procedure Laterality Date  . ABDOMINAL HYSTERECTOMY    . BREAST BIOPSY  04/30/2012   Procedure: BREAST BIOPSY WITH NEEDLE LOCALIZATION;  Surgeon: Rolm Bookbinder, MD;  Location: [facility];  Service: General;  Laterality: Left;  Left breast wire localization biospy  . HEMORRHOID SURGERY    . PACEMAKER INSERTION  02-06-2011  . TAH and BSO    . TONSILLECTOMY       Family History  Problem Relation Age of Onset  . Heart disease Mother   . Cancer Sister     breast     Social History   Social History  . Marital status: Widowed    Spouse name: N/A  . Number of children: 2  . Years of education: N/A   Occupational History  . cna    Social History Main Topics  . Smoking status: Former Smoker    Packs/day: 1.00    Years: 4.00    Types: Cigarettes    Quit date: 08/07/1977  . Smokeless tobacco: Never Used  . Alcohol use No  Comment: quit 1979  . Drug use: No  . Sexual activity: Not on file   Other Topics Concern  . Not on file   Social History Narrative   Pt has 11 siblings in all     BP 124/82   Pulse 95   Ht 5' 7.5" (1.715 m)   Wt 293 lb 3.2 oz (133 kg)   SpO2 97%   BMI 45.24 kg/m   Physical Exam:  obese appearing 76 year old woman, looking older than her stated age, NAD HEENT: Unremarkable Neck:  7 cm JVD, no thyromegally Back:  No CVA tenderness Lungs:  Clear with no wheezes, rales, or rhonchi. HEART:  Distant, Regular rate rhythm, no murmurs, no rubs, no clicks Abd:  soft, positive bowel sounds, no organomegally, no rebound, no guarding Ext:  2 plus pulses, 2+ peripheral edema, no cyanosis, no clubbing Skin:  No rashes no nodules Neuro:  CN II through XII intact, motor grossly intact  DEVICE  Normal device function.  See  PaceArt for details.   ECG - NSR with ventricular pacing Assess/Plan: 1. Chronic diastolic heart failure - she is euvolemic today or nearly so. I have asked her to continue her current meds and reduce her salt intake. 2. HTN -her blood pressure is controlled. No change in meds. 3. Obesity - she admits to dietary indiscretion. I have asked her to lose weight. 4. PPM - her St. Jude DDD PM is working normally. Will follow. Almost 5 years of battery longevity.  Mikle Bosworth.D.

## 2016-10-27 ENCOUNTER — Ambulatory Visit (INDEPENDENT_AMBULATORY_CARE_PROVIDER_SITE_OTHER): Payer: Medicare HMO | Admitting: Pharmacist

## 2016-10-27 DIAGNOSIS — Z5181 Encounter for therapeutic drug level monitoring: Secondary | ICD-10-CM

## 2016-10-27 DIAGNOSIS — Z950 Presence of cardiac pacemaker: Secondary | ICD-10-CM

## 2016-10-27 DIAGNOSIS — I471 Supraventricular tachycardia, unspecified: Secondary | ICD-10-CM

## 2016-10-27 LAB — POCT INR: INR: 2.2

## 2016-11-28 ENCOUNTER — Other Ambulatory Visit: Payer: Self-pay | Admitting: Internal Medicine

## 2016-12-06 ENCOUNTER — Ambulatory Visit (INDEPENDENT_AMBULATORY_CARE_PROVIDER_SITE_OTHER): Payer: Medicare HMO | Admitting: *Deleted

## 2016-12-06 DIAGNOSIS — I471 Supraventricular tachycardia: Secondary | ICD-10-CM | POA: Diagnosis not present

## 2016-12-06 DIAGNOSIS — Z950 Presence of cardiac pacemaker: Secondary | ICD-10-CM | POA: Diagnosis not present

## 2016-12-06 DIAGNOSIS — Z5181 Encounter for therapeutic drug level monitoring: Secondary | ICD-10-CM | POA: Diagnosis not present

## 2016-12-06 LAB — POCT INR: INR: 2.6

## 2017-01-02 ENCOUNTER — Encounter: Payer: Medicare HMO | Admitting: Internal Medicine

## 2017-01-16 ENCOUNTER — Encounter: Payer: Medicare HMO | Admitting: Adult Health

## 2017-01-19 ENCOUNTER — Encounter (INDEPENDENT_AMBULATORY_CARE_PROVIDER_SITE_OTHER): Payer: Self-pay

## 2017-01-19 ENCOUNTER — Ambulatory Visit (INDEPENDENT_AMBULATORY_CARE_PROVIDER_SITE_OTHER): Payer: Medicare HMO | Admitting: Pharmacist

## 2017-01-19 DIAGNOSIS — I471 Supraventricular tachycardia, unspecified: Secondary | ICD-10-CM

## 2017-01-19 DIAGNOSIS — Z5181 Encounter for therapeutic drug level monitoring: Secondary | ICD-10-CM

## 2017-01-19 DIAGNOSIS — Z950 Presence of cardiac pacemaker: Secondary | ICD-10-CM

## 2017-01-19 LAB — POCT INR: INR: 3.2

## 2017-02-06 DIAGNOSIS — I509 Heart failure, unspecified: Secondary | ICD-10-CM | POA: Diagnosis not present

## 2017-02-06 DIAGNOSIS — N189 Chronic kidney disease, unspecified: Secondary | ICD-10-CM | POA: Diagnosis not present

## 2017-02-06 DIAGNOSIS — Z950 Presence of cardiac pacemaker: Secondary | ICD-10-CM | POA: Diagnosis not present

## 2017-02-06 DIAGNOSIS — M1990 Unspecified osteoarthritis, unspecified site: Secondary | ICD-10-CM | POA: Diagnosis not present

## 2017-02-06 DIAGNOSIS — I130 Hypertensive heart and chronic kidney disease with heart failure and stage 1 through stage 4 chronic kidney disease, or unspecified chronic kidney disease: Secondary | ICD-10-CM | POA: Diagnosis not present

## 2017-02-06 DIAGNOSIS — Z6841 Body Mass Index (BMI) 40.0 and over, adult: Secondary | ICD-10-CM | POA: Diagnosis not present

## 2017-02-06 DIAGNOSIS — M545 Low back pain: Secondary | ICD-10-CM | POA: Diagnosis not present

## 2017-02-06 DIAGNOSIS — Z9849 Cataract extraction status, unspecified eye: Secondary | ICD-10-CM | POA: Diagnosis not present

## 2017-02-06 DIAGNOSIS — I4891 Unspecified atrial fibrillation: Secondary | ICD-10-CM | POA: Diagnosis not present

## 2017-02-14 ENCOUNTER — Ambulatory Visit (INDEPENDENT_AMBULATORY_CARE_PROVIDER_SITE_OTHER): Payer: Medicare HMO | Admitting: Pharmacist

## 2017-02-14 DIAGNOSIS — I471 Supraventricular tachycardia: Secondary | ICD-10-CM | POA: Diagnosis not present

## 2017-02-14 DIAGNOSIS — Z950 Presence of cardiac pacemaker: Secondary | ICD-10-CM

## 2017-02-14 DIAGNOSIS — Z5181 Encounter for therapeutic drug level monitoring: Secondary | ICD-10-CM | POA: Diagnosis not present

## 2017-02-14 LAB — POCT INR: INR: 4.8

## 2017-02-16 ENCOUNTER — Emergency Department (HOSPITAL_COMMUNITY): Payer: Medicare HMO

## 2017-02-16 ENCOUNTER — Encounter (HOSPITAL_COMMUNITY): Payer: Self-pay | Admitting: Emergency Medicine

## 2017-02-16 ENCOUNTER — Emergency Department (HOSPITAL_COMMUNITY)
Admission: EM | Admit: 2017-02-16 | Discharge: 2017-02-16 | Disposition: A | Discharge status: Home / Self Care | Payer: Medicare HMO | Attending: Emergency Medicine | Admitting: Emergency Medicine

## 2017-02-16 DIAGNOSIS — J45909 Unspecified asthma, uncomplicated: Secondary | ICD-10-CM | POA: Diagnosis not present

## 2017-02-16 DIAGNOSIS — S7001XA Contusion of right hip, initial encounter: Secondary | ICD-10-CM | POA: Insufficient documentation

## 2017-02-16 DIAGNOSIS — I110 Hypertensive heart disease with heart failure: Secondary | ICD-10-CM | POA: Diagnosis not present

## 2017-02-16 DIAGNOSIS — M25551 Pain in right hip: Secondary | ICD-10-CM | POA: Diagnosis not present

## 2017-02-16 DIAGNOSIS — Z87891 Personal history of nicotine dependence: Secondary | ICD-10-CM | POA: Diagnosis not present

## 2017-02-16 DIAGNOSIS — Z7901 Long term (current) use of anticoagulants: Secondary | ICD-10-CM | POA: Insufficient documentation

## 2017-02-16 DIAGNOSIS — Z79899 Other long term (current) drug therapy: Secondary | ICD-10-CM | POA: Diagnosis not present

## 2017-02-16 DIAGNOSIS — W010XXA Fall on same level from slipping, tripping and stumbling without subsequent striking against object, initial encounter: Secondary | ICD-10-CM | POA: Insufficient documentation

## 2017-02-16 DIAGNOSIS — Z950 Presence of cardiac pacemaker: Secondary | ICD-10-CM | POA: Insufficient documentation

## 2017-02-16 DIAGNOSIS — Y999 Unspecified external cause status: Secondary | ICD-10-CM | POA: Insufficient documentation

## 2017-02-16 DIAGNOSIS — S79911A Unspecified injury of right hip, initial encounter: Secondary | ICD-10-CM | POA: Diagnosis not present

## 2017-02-16 DIAGNOSIS — Y929 Unspecified place or not applicable: Secondary | ICD-10-CM | POA: Diagnosis not present

## 2017-02-16 DIAGNOSIS — Y9389 Activity, other specified: Secondary | ICD-10-CM | POA: Insufficient documentation

## 2017-02-16 DIAGNOSIS — I509 Heart failure, unspecified: Secondary | ICD-10-CM | POA: Diagnosis not present

## 2017-02-16 LAB — CBC WITH DIFFERENTIAL/PLATELET
BASOS PCT: 0 %
Basophils Absolute: 0 10*3/uL (ref 0.0–0.1)
Eosinophils Absolute: 0 10*3/uL (ref 0.0–0.7)
Eosinophils Relative: 0 %
HEMATOCRIT: 35.6 % — AB (ref 36.0–46.0)
HEMOGLOBIN: 11.3 g/dL — AB (ref 12.0–15.0)
Lymphocytes Relative: 12 %
Lymphs Abs: 1.5 10*3/uL (ref 0.7–4.0)
MCH: 29.4 pg (ref 26.0–34.0)
MCHC: 31.7 g/dL (ref 30.0–36.0)
MCV: 92.7 fL (ref 78.0–100.0)
MONOS PCT: 6 %
Monocytes Absolute: 0.7 10*3/uL (ref 0.1–1.0)
NEUTROS ABS: 9.7 10*3/uL — AB (ref 1.7–7.7)
NEUTROS PCT: 82 %
Platelets: 237 10*3/uL (ref 150–400)
RBC: 3.84 MIL/uL — ABNORMAL LOW (ref 3.87–5.11)
RDW: 14.1 % (ref 11.5–15.5)
WBC: 12 10*3/uL — ABNORMAL HIGH (ref 4.0–10.5)

## 2017-02-16 LAB — PROTIME-INR
INR: 2.44
Prothrombin Time: 27 seconds — ABNORMAL HIGH (ref 11.4–15.2)

## 2017-02-16 LAB — BASIC METABOLIC PANEL
ANION GAP: 9 (ref 5–15)
BUN: 15 mg/dL (ref 6–20)
CALCIUM: 8.5 mg/dL — AB (ref 8.9–10.3)
CHLORIDE: 111 mmol/L (ref 101–111)
CO2: 19 mmol/L — AB (ref 22–32)
Creatinine, Ser: 1.14 mg/dL — ABNORMAL HIGH (ref 0.44–1.00)
GFR calc non Af Amer: 45 mL/min — ABNORMAL LOW (ref >60)
GFR, EST AFRICAN AMERICAN: 52 mL/min — AB (ref >60)
Glucose, Bld: 137 mg/dL — ABNORMAL HIGH (ref 65–99)
Potassium: 4.3 mmol/L (ref 3.5–5.1)
Sodium: 139 mmol/L (ref 135–145)

## 2017-02-16 MED ORDER — FENTANYL CITRATE (PF) 100 MCG/2ML IJ SOLN
50.0000 ug | Freq: Once | INTRAMUSCULAR | Status: AC
  Administered 2017-02-16: 50 ug via INTRAVENOUS
  Filled 2017-02-16: qty 2

## 2017-02-16 MED ORDER — ONDANSETRON HCL 4 MG/2ML IJ SOLN
4.0000 mg | Freq: Once | INTRAMUSCULAR | Status: AC
  Administered 2017-02-16: 4 mg via INTRAVENOUS
  Filled 2017-02-16: qty 2

## 2017-02-16 MED ORDER — MORPHINE SULFATE (PF) 4 MG/ML IV SOLN
4.0000 mg | Freq: Once | INTRAVENOUS | Status: AC
  Administered 2017-02-16: 4 mg via INTRAVENOUS
  Filled 2017-02-16: qty 1

## 2017-02-16 MED ORDER — HYDROCODONE-ACETAMINOPHEN 5-325 MG PO TABS: 1.0000 | ORAL_TABLET | ORAL | 0 refills | Status: DC | PRN

## 2017-02-16 NOTE — Discharge Instructions (Signed)
Hold coumadin tonight.  Take stool softener while on pain medication.

## 2017-02-16 NOTE — ED Triage Notes (Signed)
Pt reports falling today while trying to kill a bug, denies dizziness, CP, SOB.  Pt reports R hip pain, able to bear weight with pain, bruise noted to lateral hip. Pt reports taking blood thinners, denies head trauma. Ptt reports some mild pain to R wrist. Resp e/u.

## 2017-02-16 NOTE — ED Provider Notes (Signed)
[facility] DEPT Provider Note   CSN: [id number] Arrival date & time: 02/16/17  1008     History   Chief Complaint Chief Complaint  Patient presents with  . Fall  . Hip Pain    HPI [name] is a 76 y.o. female.  Pt presents to the ED with right hip pain.  The pt said she was awake around 0300 when she tripped trying to kill a bug.  The pt landed on her right hip.  She is able to ambulate.  She is on coumadin for a.fib.  She denies hitting her head.  chadvasc score of 5 (age, female, chf, htn)      Past Medical History:  Diagnosis Date  . Allergic rhinitis   . Anemia    hx due to med  . Arthritis   . Asthma   . CHF (congestive heart failure) ([place])   . Emphysema of lung ([place])   . Hyperlipidemia   . Hypertension   . Pacemaker   . PAT (paroxysmal atrial tachycardia) ([place]) 2008  . Pneumonia    hx  . 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 Encounter for therapeutic drug monitoring 05/11/2015  . Tachy-brady syndrome ([place]) 05/11/2015  . Severe obesity (BMI >= 40) ([place]) 05/03/2015  . Essential hypertension 02/03/2015  . Dyspnea 02/02/2015  . CHF (congestive heart failure) ([place]) 01/04/2015  . 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 Complete heart block ([place]) 05/09/2011  . Obesity, Class III, BMI 40-49.9 (morbid obesity) ([place]) 05/09/2011  . 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 PAROXYSMAL ATRIAL TACHYCARDIA 06/01/2007  . Allergic rhinitis 06/01/2007  . Mild persistent asthma 06/01/2007    Past Surgical History:  Procedure Laterality Date  . ABDOMINAL HYSTERECTOMY    . BREAST BIOPSY  04/30/2012   Procedure: BREAST BIOPSY WITH NEEDLE LOCALIZATION;  Surgeon: Rolm Bookbinder, MD;  Location: [facility];  Service: General;  Laterality: Left;  Left breast wire localization biospy  . HEMORRHOID SURGERY    . PACEMAKER INSERTION  02-06-2011  . TAH and BSO    . TONSILLECTOMY      OB History    No data available       Home Medications    Prior to Admission medications   Medication Sig Start Date End Date Taking? Authorizing Provider  bisoprolol (ZEBETA) 5 MG tablet TAKE ONE TABLET BY MOUTH ONCE DAILY 11/29/16  Yes Tanda Rockers, MD  Bromfenac Sodium (PROLENSA) 0.07 % SOLN Apply to eye as directed.   Yes [provider]  furosemide (LASIX) 40 MG tablet Take 40 mg by mouth 2 (two) times daily.    Yes [provider]  mometasone-formoterol (DULERA) 100-5 MCG/ACT AERO Inhale 2 puffs into the lungs 2 (two) times daily. 03/13/16  Yes Tanda Rockers, MD  omeprazole (PRILOSEC) 20 MG capsule Take 20 mg by mouth daily. Take 30-60 min before first meal of the day   Yes [provider]  Propylene Glycol-Glycerin (SOOTHE) 0.6-0.6 % SOLN Apply to eye as directed.   Yes [provider]  valsartan (DIOVAN) 160 MG tablet Take 160 mg by mouth daily.   Yes [provider]  warfarin (COUMADIN) 3 MG tablet Take 3-4.5 mg by mouth daily. 4.5 mg on all days except on Wednesday patient takes 6 mg   Yes [provider]  HYDROcodone-acetaminophen (NORCO/VICODIN) 5-325 MG tablet Take 1 tablet by  mouth every 4 (four) hours as needed. 02/16/17   Isla Pence, MD  montelukast (SINGULAIR) 10 MG tablet TAKE ONE TABLET BY MOUTH AT BEDTIME EVERY NIGHT Patient not taking: Reported on 02/16/2017 02/07/16   Tanda Rockers, MD  spironolactone (ALDACTONE) 25 MG tablet TAKE ONE TABLET BY MOUTH ONCE DAILY Patient not taking: Reported on 02/16/2017 05/05/16   Tanda Rockers, MD  warfarin (COUMADIN) 3 MG tablet TAKE ONE TABLET BY MOUTH  AS DIRECTED Patient not taking: Reported on 02/16/2017 10/16/16   Evans Lance, MD    Family History Family History  Problem Relation Age of Onset  . Heart disease Mother   . Cancer Sister        breast    Social History Social History  Substance Use Topics  . Smoking status: Former Smoker    Packs/day: 1.00    Years: 4.00     Types: Cigarettes    Quit date: 08/07/1977  . Smokeless tobacco: Never Used  . Alcohol use No     Comment: quit 1979     Allergies   Albuterol   Review of Systems Review of Systems  Musculoskeletal:       Right hip pain  All other systems reviewed and are negative.    Physical Exam Updated Vital Signs BP (!) 94/50 (BP Location: Right Arm)   Pulse 71   Temp 97.6 F (36.4 C) (Oral)   Resp 18   SpO2 98%   Physical Exam  Constitutional: She appears well-developed and well-nourished.  HENT:  Head: Normocephalic and atraumatic.  Right Ear: External ear normal.  Left Ear: External ear normal.  Nose: Nose normal.  Mouth/Throat: Oropharynx is clear and moist.  Eyes: Pupils are equal, round, and reactive to light. Conjunctivae and EOM are normal.  Neck: Normal range of motion. Neck supple.  Cardiovascular: Normal rate, regular rhythm, normal heart sounds and intact distal pulses.   Pulmonary/Chest: Effort normal and breath sounds normal.  Abdominal: Soft. Bowel sounds are normal.  Musculoskeletal: She exhibits edema.  Right thigh hematoma  Neurological: She is alert.  Skin: Skin is warm.  Psychiatric: She has a normal mood and affect. Her behavior is normal. Judgment and thought content normal.  Nursing note and vitals reviewed.    ED Treatments / Results  Labs (all labs ordered are listed, but only abnormal results are displayed) Labs Reviewed  BASIC METABOLIC PANEL - Abnormal; Notable for the following:       Result Value   CO2 19 (*)    Glucose, Bld 137 (*)    Creatinine, Ser 1.14 (*)    Calcium 8.5 (*)    GFR calc non Af Amer 45 (*)    GFR calc Af Amer 52 (*)    All other components within normal limits  CBC WITH DIFFERENTIAL/PLATELET - Abnormal; Notable for the following:    WBC 12.0 (*)    RBC 3.84 (*)    Hemoglobin 11.3 (*)    HCT 35.6 (*)    Neutro Abs 9.7 (*)    All other components within normal limits  PROTIME-INR - Abnormal; Notable for the  following:    Prothrombin Time 27.0 (*)    All other components within normal limits    EKG  EKG Interpretation None       Radiology Dg Hip Unilat  With Pelvis 2-3 Views Right  Result Date: 02/16/2017 CLINICAL DATA:  Right hip pain after fall EXAM: DG HIP (WITH OR WITHOUT PELVIS) 2-3V  RIGHT COMPARISON:  None. FINDINGS: No fracture or dislocation is seen. Bilateral hip joint spaces are preserved. Visualized bony pelvis appears intact. IMPRESSION: Negative. Electronically Signed   By: Julian Hy M.D.   On: 02/16/2017 11:24    Procedures Procedures (including critical care time)  Medications Ordered in ED Medications  fentaNYL (SUBLIMAZE) injection 50 mcg (not administered)  morphine 4 MG/ML injection 4 mg (4 mg Intravenous Given 02/16/17 1128)  ondansetron (ZOFRAN) injection 4 mg (4 mg Intravenous Given 02/16/17 1128)     Initial Impression / Assessment and Plan / ED Course  I have reviewed the triage vital signs and the nursing notes.  Pertinent labs & imaging results that were available during my care of the patient were reviewed by me and considered in my medical decision making (see chart for details).   Pt is feeling better.  She is able to ambulate.  Final Clinical Impressions(s) / ED Diagnoses   Final diagnoses:  Hip hematoma, right, initial encounter    New Prescriptions New Prescriptions   HYDROCODONE-ACETAMINOPHEN (NORCO/VICODIN) 5-325 MG TABLET    Take 1 tablet by mouth every 4 (four) hours as needed.     Isla Pence, MD 02/16/17 1233

## 2017-02-23 ENCOUNTER — Ambulatory Visit (INDEPENDENT_AMBULATORY_CARE_PROVIDER_SITE_OTHER): Payer: Medicare HMO | Admitting: *Deleted

## 2017-02-23 DIAGNOSIS — I471 Supraventricular tachycardia, unspecified: Secondary | ICD-10-CM

## 2017-02-23 DIAGNOSIS — Z5181 Encounter for therapeutic drug level monitoring: Secondary | ICD-10-CM

## 2017-02-23 DIAGNOSIS — Z950 Presence of cardiac pacemaker: Secondary | ICD-10-CM | POA: Diagnosis not present

## 2017-02-23 LAB — POCT INR: INR: 1.9

## 2017-02-27 DIAGNOSIS — R071 Chest pain on breathing: Secondary | ICD-10-CM | POA: Diagnosis not present

## 2017-02-27 DIAGNOSIS — J4520 Mild intermittent asthma, uncomplicated: Secondary | ICD-10-CM | POA: Diagnosis not present

## 2017-02-27 DIAGNOSIS — R0602 Shortness of breath: Secondary | ICD-10-CM | POA: Diagnosis not present

## 2017-02-27 DIAGNOSIS — I10 Essential (primary) hypertension: Secondary | ICD-10-CM | POA: Diagnosis not present

## 2017-03-06 ENCOUNTER — Ambulatory Visit (INDEPENDENT_AMBULATORY_CARE_PROVIDER_SITE_OTHER): Payer: Medicare HMO | Admitting: *Deleted

## 2017-03-06 DIAGNOSIS — Z5181 Encounter for therapeutic drug level monitoring: Secondary | ICD-10-CM | POA: Diagnosis not present

## 2017-03-06 DIAGNOSIS — Z950 Presence of cardiac pacemaker: Secondary | ICD-10-CM | POA: Diagnosis not present

## 2017-03-06 DIAGNOSIS — I471 Supraventricular tachycardia: Secondary | ICD-10-CM | POA: Diagnosis not present

## 2017-03-06 LAB — POCT INR: INR: 1.3

## 2017-03-16 ENCOUNTER — Ambulatory Visit (INDEPENDENT_AMBULATORY_CARE_PROVIDER_SITE_OTHER): Payer: Medicare HMO | Admitting: *Deleted

## 2017-03-16 ENCOUNTER — Encounter (INDEPENDENT_AMBULATORY_CARE_PROVIDER_SITE_OTHER): Payer: Self-pay

## 2017-03-16 DIAGNOSIS — Z950 Presence of cardiac pacemaker: Secondary | ICD-10-CM

## 2017-03-16 DIAGNOSIS — I471 Supraventricular tachycardia: Secondary | ICD-10-CM | POA: Diagnosis not present

## 2017-03-16 DIAGNOSIS — Z5181 Encounter for therapeutic drug level monitoring: Secondary | ICD-10-CM

## 2017-03-16 LAB — POCT INR: INR: 2.7

## 2017-03-22 ENCOUNTER — Other Ambulatory Visit: Payer: Self-pay | Admitting: Internal Medicine

## 2017-03-29 ENCOUNTER — Ambulatory Visit (INDEPENDENT_AMBULATORY_CARE_PROVIDER_SITE_OTHER): Payer: Medicare HMO

## 2017-03-29 DIAGNOSIS — Z5181 Encounter for therapeutic drug level monitoring: Secondary | ICD-10-CM

## 2017-03-29 DIAGNOSIS — Z950 Presence of cardiac pacemaker: Secondary | ICD-10-CM | POA: Diagnosis not present

## 2017-03-29 DIAGNOSIS — I471 Supraventricular tachycardia: Secondary | ICD-10-CM

## 2017-03-29 LAB — POCT INR: INR: 3.1

## 2017-04-02 DIAGNOSIS — I10 Essential (primary) hypertension: Secondary | ICD-10-CM | POA: Diagnosis not present

## 2017-04-02 DIAGNOSIS — R0602 Shortness of breath: Secondary | ICD-10-CM | POA: Diagnosis not present

## 2017-04-18 ENCOUNTER — Ambulatory Visit (INDEPENDENT_AMBULATORY_CARE_PROVIDER_SITE_OTHER): Payer: Medicare HMO | Admitting: *Deleted

## 2017-04-18 DIAGNOSIS — I471 Supraventricular tachycardia: Secondary | ICD-10-CM

## 2017-04-18 DIAGNOSIS — Z950 Presence of cardiac pacemaker: Secondary | ICD-10-CM | POA: Diagnosis not present

## 2017-04-18 DIAGNOSIS — Z5181 Encounter for therapeutic drug level monitoring: Secondary | ICD-10-CM

## 2017-04-18 LAB — POCT INR: INR: 2.8

## 2017-05-21 ENCOUNTER — Ambulatory Visit (INDEPENDENT_AMBULATORY_CARE_PROVIDER_SITE_OTHER): Payer: Medicare HMO | Admitting: *Deleted

## 2017-05-21 DIAGNOSIS — I471 Supraventricular tachycardia: Secondary | ICD-10-CM

## 2017-05-21 DIAGNOSIS — Z950 Presence of cardiac pacemaker: Secondary | ICD-10-CM

## 2017-05-21 DIAGNOSIS — Z5181 Encounter for therapeutic drug level monitoring: Secondary | ICD-10-CM | POA: Diagnosis not present

## 2017-05-21 LAB — POCT INR: INR: 1.8

## 2017-06-12 ENCOUNTER — Ambulatory Visit (INDEPENDENT_AMBULATORY_CARE_PROVIDER_SITE_OTHER): Payer: Medicare HMO | Admitting: *Deleted

## 2017-06-12 DIAGNOSIS — I471 Supraventricular tachycardia, unspecified: Secondary | ICD-10-CM

## 2017-06-12 DIAGNOSIS — Z950 Presence of cardiac pacemaker: Secondary | ICD-10-CM

## 2017-06-12 DIAGNOSIS — Z5181 Encounter for therapeutic drug level monitoring: Secondary | ICD-10-CM

## 2017-06-12 LAB — POCT INR: INR: 1.9

## 2017-06-19 DIAGNOSIS — H11153 Pinguecula, bilateral: Secondary | ICD-10-CM | POA: Diagnosis not present

## 2017-06-19 DIAGNOSIS — Z961 Presence of intraocular lens: Secondary | ICD-10-CM | POA: Diagnosis not present

## 2017-06-19 DIAGNOSIS — H04123 Dry eye syndrome of bilateral lacrimal glands: Secondary | ICD-10-CM | POA: Diagnosis not present

## 2017-06-19 DIAGNOSIS — H11423 Conjunctival edema, bilateral: Secondary | ICD-10-CM | POA: Diagnosis not present

## 2017-06-19 DIAGNOSIS — H40013 Open angle with borderline findings, low risk, bilateral: Secondary | ICD-10-CM | POA: Diagnosis not present

## 2017-06-19 DIAGNOSIS — H3589 Other specified retinal disorders: Secondary | ICD-10-CM | POA: Diagnosis not present

## 2017-06-19 DIAGNOSIS — H40003 Preglaucoma, unspecified, bilateral: Secondary | ICD-10-CM | POA: Diagnosis not present

## 2017-06-19 DIAGNOSIS — H26492 Other secondary cataract, left eye: Secondary | ICD-10-CM | POA: Diagnosis not present

## 2017-06-19 DIAGNOSIS — H18413 Arcus senilis, bilateral: Secondary | ICD-10-CM | POA: Diagnosis not present

## 2017-06-25 DIAGNOSIS — H26493 Other secondary cataract, bilateral: Secondary | ICD-10-CM | POA: Diagnosis not present

## 2017-06-25 DIAGNOSIS — H26492 Other secondary cataract, left eye: Secondary | ICD-10-CM | POA: Diagnosis not present

## 2017-07-02 DIAGNOSIS — H52222 Regular astigmatism, left eye: Secondary | ICD-10-CM | POA: Diagnosis not present

## 2017-07-02 DIAGNOSIS — Z961 Presence of intraocular lens: Secondary | ICD-10-CM | POA: Diagnosis not present

## 2017-07-02 DIAGNOSIS — H11423 Conjunctival edema, bilateral: Secondary | ICD-10-CM | POA: Diagnosis not present

## 2017-07-02 DIAGNOSIS — H26491 Other secondary cataract, right eye: Secondary | ICD-10-CM | POA: Diagnosis not present

## 2017-07-02 DIAGNOSIS — H5202 Hypermetropia, left eye: Secondary | ICD-10-CM | POA: Diagnosis not present

## 2017-07-02 DIAGNOSIS — Z9842 Cataract extraction status, left eye: Secondary | ICD-10-CM | POA: Diagnosis not present

## 2017-07-02 DIAGNOSIS — H11823 Conjunctivochalasis, bilateral: Secondary | ICD-10-CM | POA: Diagnosis not present

## 2017-07-02 DIAGNOSIS — H18413 Arcus senilis, bilateral: Secondary | ICD-10-CM | POA: Diagnosis not present

## 2017-07-12 ENCOUNTER — Ambulatory Visit (INDEPENDENT_AMBULATORY_CARE_PROVIDER_SITE_OTHER): Payer: Medicare HMO

## 2017-07-12 DIAGNOSIS — Z5181 Encounter for therapeutic drug level monitoring: Secondary | ICD-10-CM | POA: Diagnosis not present

## 2017-07-12 DIAGNOSIS — I471 Supraventricular tachycardia: Secondary | ICD-10-CM

## 2017-07-12 DIAGNOSIS — Z950 Presence of cardiac pacemaker: Secondary | ICD-10-CM

## 2017-07-12 LAB — POCT INR: INR: 2.3

## 2017-07-12 NOTE — Patient Instructions (Signed)
Continue on same dosage 1.5 tablets everyday except 2 tablets on Sundays.  Recheck in 3 weeks.  Call Coumadin Clinic with any new or different medications or procedures #  [phone]

## 2017-08-09 ENCOUNTER — Ambulatory Visit (INDEPENDENT_AMBULATORY_CARE_PROVIDER_SITE_OTHER): Payer: Medicare HMO

## 2017-08-09 DIAGNOSIS — Z5181 Encounter for therapeutic drug level monitoring: Secondary | ICD-10-CM

## 2017-08-09 DIAGNOSIS — Z950 Presence of cardiac pacemaker: Secondary | ICD-10-CM

## 2017-08-09 DIAGNOSIS — I471 Supraventricular tachycardia: Secondary | ICD-10-CM

## 2017-08-09 LAB — POCT INR: INR: 1.4

## 2017-08-09 NOTE — Patient Instructions (Signed)
Description   Take 2 tablets today and tomorrow, then resume same dosage 1.5 tablets everyday except 2 tablets on Sundays.  Recheck in 10 days.  Call Coumadin Clinic with any new or different medications or procedures #  [phone]

## 2017-08-21 ENCOUNTER — Ambulatory Visit (INDEPENDENT_AMBULATORY_CARE_PROVIDER_SITE_OTHER): Payer: Medicare HMO | Admitting: *Deleted

## 2017-08-21 DIAGNOSIS — Z5181 Encounter for therapeutic drug level monitoring: Secondary | ICD-10-CM | POA: Diagnosis not present

## 2017-08-21 DIAGNOSIS — I471 Supraventricular tachycardia: Secondary | ICD-10-CM | POA: Diagnosis not present

## 2017-08-21 DIAGNOSIS — Z950 Presence of cardiac pacemaker: Secondary | ICD-10-CM

## 2017-08-21 LAB — POCT INR: INR: 3.4

## 2017-08-21 MED ORDER — WARFARIN SODIUM 3 MG PO TABS: ORAL_TABLET | ORAL | 0 refills | Status: DC

## 2017-08-21 NOTE — Patient Instructions (Signed)
Description   Since you missed last pm dose, continue taking 1.5 tablets everyday except 2 tablets on Sundays.  Recheck in 2 weeks.  Call Coumadin Clinic with any new or different medications or procedures #  [phone]

## 2017-08-22 ENCOUNTER — Ambulatory Visit (INDEPENDENT_AMBULATORY_CARE_PROVIDER_SITE_OTHER): Payer: Medicare HMO | Admitting: *Deleted

## 2017-08-22 DIAGNOSIS — I442 Atrioventricular block, complete: Secondary | ICD-10-CM

## 2017-08-23 LAB — CUP PACEART INCLINIC DEVICE CHECK
Brady Statistic RV Percent Paced: 99.89 %
Implantable Lead Implant Date: 20120702
Implantable Lead Location: 753859
Implantable Pulse Generator Implant Date: 20120702
Lead Channel Impedance Value: 362.5 Ohm
Lead Channel Impedance Value: 437.5 Ohm
Lead Channel Pacing Threshold Amplitude: 0.75 V
Lead Channel Pacing Threshold Amplitude: 1 V
Lead Channel Pacing Threshold Amplitude: 1 V
Lead Channel Pacing Threshold Pulse Width: 0.4 ms
Lead Channel Pacing Threshold Pulse Width: 0.5 ms
Lead Channel Setting Pacing Amplitude: 2.5 V
Lead Channel Setting Pacing Pulse Width: 0.5 ms
Lead Channel Setting Sensing Sensitivity: 4 mV
MDC IDC LEAD IMPLANT DT: 20120702
MDC IDC LEAD LOCATION: 753860
MDC IDC MSMT BATTERY REMAINING LONGEVITY: 48 mo
MDC IDC MSMT BATTERY VOLTAGE: 2.86 V
MDC IDC MSMT LEADCHNL RA PACING THRESHOLD AMPLITUDE: 0.75 V
MDC IDC MSMT LEADCHNL RA PACING THRESHOLD PULSEWIDTH: 0.4 ms
MDC IDC MSMT LEADCHNL RA SENSING INTR AMPL: 3.4 mV
MDC IDC MSMT LEADCHNL RV PACING THRESHOLD PULSEWIDTH: 0.5 ms
MDC IDC MSMT LEADCHNL RV SENSING INTR AMPL: 9.9 mV
MDC IDC SESS DTM: 20190116212650
MDC IDC SET LEADCHNL RA PACING AMPLITUDE: 2 V
MDC IDC STAT BRADY RA PERCENT PACED: 11 %
Pulse Gen Serial Number: 7254877

## 2017-08-23 NOTE — Progress Notes (Signed)
Pacemaker check in clinic. Normal device function. Thresholds, sensing, impedances consistent with previous measurements. Device programmed to maximize longevity. <1% AT/AF burden, + warfarin. No high ventricular rates noted. (135) "Consecutive PVC" episodes. (1) "PMT" episode-no VA conduction. Device programmed at appropriate safety margins. Histogram distribution appropriate for patient activity level. Device programmed to optimize intrinsic conduction. Estimated longevity 4 years. Patient will follow up with GT in 6 months.

## 2017-08-24 DIAGNOSIS — H26491 Other secondary cataract, right eye: Secondary | ICD-10-CM | POA: Diagnosis not present

## 2017-08-31 DIAGNOSIS — H5989 Other postprocedural complications and disorders of eye and adnexa, not elsewhere classified: Secondary | ICD-10-CM | POA: Diagnosis not present

## 2017-08-31 DIAGNOSIS — H40013 Open angle with borderline findings, low risk, bilateral: Secondary | ICD-10-CM | POA: Diagnosis not present

## 2017-08-31 DIAGNOSIS — H04123 Dry eye syndrome of bilateral lacrimal glands: Secondary | ICD-10-CM | POA: Diagnosis not present

## 2017-08-31 DIAGNOSIS — H11423 Conjunctival edema, bilateral: Secondary | ICD-10-CM | POA: Diagnosis not present

## 2017-08-31 DIAGNOSIS — Z961 Presence of intraocular lens: Secondary | ICD-10-CM | POA: Diagnosis not present

## 2017-08-31 DIAGNOSIS — H11153 Pinguecula, bilateral: Secondary | ICD-10-CM | POA: Diagnosis not present

## 2017-08-31 DIAGNOSIS — H18413 Arcus senilis, bilateral: Secondary | ICD-10-CM | POA: Diagnosis not present

## 2017-08-31 DIAGNOSIS — H11823 Conjunctivochalasis, bilateral: Secondary | ICD-10-CM | POA: Diagnosis not present

## 2017-08-31 DIAGNOSIS — H3589 Other specified retinal disorders: Secondary | ICD-10-CM | POA: Diagnosis not present

## 2017-09-04 ENCOUNTER — Ambulatory Visit (INDEPENDENT_AMBULATORY_CARE_PROVIDER_SITE_OTHER): Payer: Medicare HMO | Admitting: *Deleted

## 2017-09-04 DIAGNOSIS — I471 Supraventricular tachycardia: Secondary | ICD-10-CM

## 2017-09-04 DIAGNOSIS — Z950 Presence of cardiac pacemaker: Secondary | ICD-10-CM | POA: Diagnosis not present

## 2017-09-04 DIAGNOSIS — Z7901 Long term (current) use of anticoagulants: Secondary | ICD-10-CM

## 2017-09-04 DIAGNOSIS — Z5181 Encounter for therapeutic drug level monitoring: Secondary | ICD-10-CM | POA: Diagnosis not present

## 2017-09-04 LAB — POCT INR: INR: 2.5

## 2017-09-04 NOTE — Patient Instructions (Signed)
Description   Continue taking 1.5 tablets everyday except 2 tablets on Sundays.  Recheck in 3 weeks.  Call Coumadin Clinic with any new or different medications or procedures #  [phone]

## 2017-09-26 ENCOUNTER — Ambulatory Visit (INDEPENDENT_AMBULATORY_CARE_PROVIDER_SITE_OTHER): Payer: Medicare HMO | Admitting: *Deleted

## 2017-09-26 DIAGNOSIS — Z950 Presence of cardiac pacemaker: Secondary | ICD-10-CM

## 2017-09-26 DIAGNOSIS — Z5181 Encounter for therapeutic drug level monitoring: Secondary | ICD-10-CM | POA: Diagnosis not present

## 2017-09-26 DIAGNOSIS — I471 Supraventricular tachycardia: Secondary | ICD-10-CM | POA: Diagnosis not present

## 2017-09-26 LAB — POCT INR: INR: 1.4

## 2017-09-26 NOTE — Patient Instructions (Signed)
Description   Today and tomorrow take 2 tablets, then Continue taking 1.5 tablets everyday except 2 tablets on Sundays.  Recheck in 9 days.   Call Coumadin Clinic with any new or different medications or procedures #  [phone]

## 2017-10-05 ENCOUNTER — Ambulatory Visit (INDEPENDENT_AMBULATORY_CARE_PROVIDER_SITE_OTHER): Payer: Medicare HMO | Admitting: *Deleted

## 2017-10-05 DIAGNOSIS — Z950 Presence of cardiac pacemaker: Secondary | ICD-10-CM

## 2017-10-05 DIAGNOSIS — Z5181 Encounter for therapeutic drug level monitoring: Secondary | ICD-10-CM | POA: Diagnosis not present

## 2017-10-05 DIAGNOSIS — I471 Supraventricular tachycardia: Secondary | ICD-10-CM | POA: Diagnosis not present

## 2017-10-05 DIAGNOSIS — Z7901 Long term (current) use of anticoagulants: Secondary | ICD-10-CM

## 2017-10-05 LAB — POCT INR: INR: 3.5

## 2017-10-05 NOTE — Patient Instructions (Signed)
Description   Skip today's dose, then Continue taking 1.5 tablets everyday except 2 tablets on Sundays.  Recheck in 12 days. Eat your leafy green vegetables 3 servings each week.  Call Coumadin Clinic with any new or different medications or procedures #  [phone]

## 2017-10-17 ENCOUNTER — Ambulatory Visit (INDEPENDENT_AMBULATORY_CARE_PROVIDER_SITE_OTHER): Payer: Medicare HMO | Admitting: *Deleted

## 2017-10-17 DIAGNOSIS — Z5181 Encounter for therapeutic drug level monitoring: Secondary | ICD-10-CM

## 2017-10-17 DIAGNOSIS — Z950 Presence of cardiac pacemaker: Secondary | ICD-10-CM | POA: Diagnosis not present

## 2017-10-17 DIAGNOSIS — I471 Supraventricular tachycardia: Secondary | ICD-10-CM

## 2017-10-17 DIAGNOSIS — Z7901 Long term (current) use of anticoagulants: Secondary | ICD-10-CM | POA: Diagnosis not present

## 2017-10-17 LAB — POCT INR: INR: 2.4

## 2017-10-17 NOTE — Patient Instructions (Signed)
Description   Continue taking 1.5 tablets everyday except 2 tablets on Sundays.  Recheck in 3 weeks. Eat your leafy green vegetables 3 servings each week.  Call Coumadin Clinic with any new or different medications or procedures #  [phone]

## 2017-11-07 ENCOUNTER — Ambulatory Visit (INDEPENDENT_AMBULATORY_CARE_PROVIDER_SITE_OTHER): Payer: Medicare HMO | Admitting: *Deleted

## 2017-11-07 DIAGNOSIS — Z950 Presence of cardiac pacemaker: Secondary | ICD-10-CM

## 2017-11-07 DIAGNOSIS — I471 Supraventricular tachycardia: Secondary | ICD-10-CM | POA: Diagnosis not present

## 2017-11-07 DIAGNOSIS — Z7901 Long term (current) use of anticoagulants: Secondary | ICD-10-CM

## 2017-11-07 DIAGNOSIS — Z5181 Encounter for therapeutic drug level monitoring: Secondary | ICD-10-CM

## 2017-11-07 LAB — POCT INR: INR: 1.4

## 2017-11-07 NOTE — Patient Instructions (Signed)
Description   Today and tomorrow take 2 tablets then continue taking 1.5 tablets everyday except 2 tablets on Sundays.  Recheck in 1 week. Eat your leafy green vegetables 3 servings each week.  Call Coumadin Clinic with any new or different medications or procedures #  [phone]

## 2017-11-14 ENCOUNTER — Ambulatory Visit (INDEPENDENT_AMBULATORY_CARE_PROVIDER_SITE_OTHER): Payer: Medicare HMO | Admitting: *Deleted

## 2017-11-14 DIAGNOSIS — Z950 Presence of cardiac pacemaker: Secondary | ICD-10-CM | POA: Diagnosis not present

## 2017-11-14 DIAGNOSIS — I471 Supraventricular tachycardia: Secondary | ICD-10-CM

## 2017-11-14 DIAGNOSIS — Z7901 Long term (current) use of anticoagulants: Secondary | ICD-10-CM

## 2017-11-14 DIAGNOSIS — Z5181 Encounter for therapeutic drug level monitoring: Secondary | ICD-10-CM

## 2017-11-14 LAB — POCT INR: INR: 2.1

## 2017-11-14 NOTE — Patient Instructions (Signed)
Description   Continue taking 1.5 tablets everyday except 2 tablets on Sundays.  Recheck in 2 weeks. Eat your leafy green vegetables 3 servings each week.  Call Coumadin Clinic with any new or different medications or procedures #  [phone]

## 2017-11-28 ENCOUNTER — Ambulatory Visit (INDEPENDENT_AMBULATORY_CARE_PROVIDER_SITE_OTHER): Payer: Medicare HMO | Admitting: Pharmacist

## 2017-11-28 DIAGNOSIS — Z950 Presence of cardiac pacemaker: Secondary | ICD-10-CM | POA: Diagnosis not present

## 2017-11-28 DIAGNOSIS — Z5181 Encounter for therapeutic drug level monitoring: Secondary | ICD-10-CM | POA: Diagnosis not present

## 2017-11-28 DIAGNOSIS — I471 Supraventricular tachycardia: Secondary | ICD-10-CM | POA: Diagnosis not present

## 2017-11-28 LAB — POCT INR: INR: 2.8

## 2017-11-28 NOTE — Patient Instructions (Signed)
Description   Continue taking 1.5 tablets everyday except 2 tablets on Sundays.  Recheck in 4 weeks. Eat your leafy green vegetables 3 servings each week.  Call Coumadin Clinic with any new or different medications or procedures #  [phone]

## 2017-12-26 ENCOUNTER — Ambulatory Visit (INDEPENDENT_AMBULATORY_CARE_PROVIDER_SITE_OTHER): Payer: Medicare HMO | Admitting: *Deleted

## 2017-12-26 DIAGNOSIS — I471 Supraventricular tachycardia: Secondary | ICD-10-CM

## 2017-12-26 DIAGNOSIS — Z950 Presence of cardiac pacemaker: Secondary | ICD-10-CM | POA: Diagnosis not present

## 2017-12-26 DIAGNOSIS — Z5181 Encounter for therapeutic drug level monitoring: Secondary | ICD-10-CM

## 2017-12-26 LAB — POCT INR: INR: 1.4 — AB (ref 2.0–3.0)

## 2017-12-26 NOTE — Patient Instructions (Signed)
Description   Today and tomorrow take 2 tablets then continue taking 1.5 tablets every day except 2 tablets on Sundays.  Recheck in 1 week. Eat your leafy green vegetables 3 servings each week.  Call Coumadin Clinic with any new or different medications or procedures #  [phone]

## 2018-01-03 ENCOUNTER — Ambulatory Visit (INDEPENDENT_AMBULATORY_CARE_PROVIDER_SITE_OTHER): Payer: Medicare HMO | Admitting: *Deleted

## 2018-01-03 DIAGNOSIS — I471 Supraventricular tachycardia: Secondary | ICD-10-CM

## 2018-01-03 DIAGNOSIS — Z950 Presence of cardiac pacemaker: Secondary | ICD-10-CM

## 2018-01-03 DIAGNOSIS — Z5181 Encounter for therapeutic drug level monitoring: Secondary | ICD-10-CM | POA: Diagnosis not present

## 2018-01-03 LAB — POCT INR: INR: 1.5 — AB (ref 2.0–3.0)

## 2018-01-03 NOTE — Patient Instructions (Signed)
Description   Today May 30th take another 1/2 tablet as she has taken 1 and 1/2 tablets then  tomorrow May 31st  take 2 tablets then continue taking 1 and 1/2 tablets every day except 2 tablets on Sundays.  Recheck in 10 days Eat your leafy green vegetables 3 servings each week.  Call Coumadin Clinic with any new or different medications or procedures #  [phone] Pt states she is going to get pill box and use this as she states she may have missed some doses

## 2018-01-14 ENCOUNTER — Ambulatory Visit (INDEPENDENT_AMBULATORY_CARE_PROVIDER_SITE_OTHER): Payer: Medicare HMO | Admitting: *Deleted

## 2018-01-14 DIAGNOSIS — I471 Supraventricular tachycardia: Secondary | ICD-10-CM | POA: Diagnosis not present

## 2018-01-14 DIAGNOSIS — Z5181 Encounter for therapeutic drug level monitoring: Secondary | ICD-10-CM

## 2018-01-14 DIAGNOSIS — Z950 Presence of cardiac pacemaker: Secondary | ICD-10-CM

## 2018-01-14 LAB — POCT INR: INR: 3.4 — AB (ref 2.0–3.0)

## 2018-01-14 NOTE — Patient Instructions (Signed)
Description   Do not take any Coumadin today then continue taking 1 and 1/2 tablets every day except 2 tablets on Sundays.  Recheck in 2 weeks. Continue eating your leafy green vegetables 3 servings each week.  Call Coumadin Clinic with any new or different medications or procedures #  [phone] Pt using pill box now.

## 2018-01-29 ENCOUNTER — Ambulatory Visit (INDEPENDENT_AMBULATORY_CARE_PROVIDER_SITE_OTHER): Payer: Medicare HMO | Admitting: *Deleted

## 2018-01-29 DIAGNOSIS — Z5181 Encounter for therapeutic drug level monitoring: Secondary | ICD-10-CM | POA: Diagnosis not present

## 2018-01-29 DIAGNOSIS — I471 Supraventricular tachycardia: Secondary | ICD-10-CM | POA: Diagnosis not present

## 2018-01-29 DIAGNOSIS — Z950 Presence of cardiac pacemaker: Secondary | ICD-10-CM

## 2018-01-29 LAB — POCT INR: INR: 3 (ref 2.0–3.0)

## 2018-01-29 NOTE — Patient Instructions (Signed)
Description   On tomorrow take 2 tablets, then continue taking 1 and 1/2 tablets every day except 2 tablets on Sundays.  Recheck in 3 weeks. Continue eating your leafy green vegetables 3 servings each week.  Call Coumadin Clinic with any new or different medications or procedures #  [phone] Pt using pill box now.

## 2018-02-08 ENCOUNTER — Other Ambulatory Visit: Payer: Self-pay | Admitting: Internal Medicine

## 2018-02-19 ENCOUNTER — Ambulatory Visit (INDEPENDENT_AMBULATORY_CARE_PROVIDER_SITE_OTHER): Payer: Medicare HMO | Admitting: *Deleted

## 2018-02-19 DIAGNOSIS — Z5181 Encounter for therapeutic drug level monitoring: Secondary | ICD-10-CM | POA: Diagnosis not present

## 2018-02-19 DIAGNOSIS — I471 Supraventricular tachycardia: Secondary | ICD-10-CM | POA: Diagnosis not present

## 2018-02-19 DIAGNOSIS — Z950 Presence of cardiac pacemaker: Secondary | ICD-10-CM | POA: Diagnosis not present

## 2018-02-19 LAB — POCT INR: INR: 2 (ref 2.0–3.0)

## 2018-02-19 NOTE — Patient Instructions (Signed)
Description   Today take 2 tablets then continue taking 1 and 1/2 tablets every day except 2 tablets on Sundays.  Recheck in 4 weeks. Continue eating your leafy green vegetables 3 servings each week.  Call Coumadin Clinic with any new or different medications or procedures #  [phone] Pt using pill box now.

## 2018-03-19 ENCOUNTER — Encounter (INDEPENDENT_AMBULATORY_CARE_PROVIDER_SITE_OTHER): Payer: Self-pay

## 2018-03-19 ENCOUNTER — Ambulatory Visit (INDEPENDENT_AMBULATORY_CARE_PROVIDER_SITE_OTHER): Payer: Medicare HMO

## 2018-03-19 DIAGNOSIS — Z5181 Encounter for therapeutic drug level monitoring: Secondary | ICD-10-CM

## 2018-03-19 DIAGNOSIS — Z950 Presence of cardiac pacemaker: Secondary | ICD-10-CM

## 2018-03-19 DIAGNOSIS — I471 Supraventricular tachycardia: Secondary | ICD-10-CM

## 2018-03-19 LAB — POCT INR: INR: 1.6 — AB (ref 2.0–3.0)

## 2018-03-19 NOTE — Patient Instructions (Signed)
Today take 2.5 tablets then continue taking 1 and 1/2 tablets every day except 2 tablets on Sundays.  Recheck in 3 weeks. Continue eating your leafy green vegetables 3 servings each week.  Call Coumadin Clinic with any new or different medications or procedures #  [phone] Pt using pill box now.

## 2018-04-11 ENCOUNTER — Ambulatory Visit (INDEPENDENT_AMBULATORY_CARE_PROVIDER_SITE_OTHER): Payer: Medicare HMO | Admitting: *Deleted

## 2018-04-11 DIAGNOSIS — Z950 Presence of cardiac pacemaker: Secondary | ICD-10-CM | POA: Diagnosis not present

## 2018-04-11 DIAGNOSIS — Z5181 Encounter for therapeutic drug level monitoring: Secondary | ICD-10-CM

## 2018-04-11 DIAGNOSIS — I471 Supraventricular tachycardia: Secondary | ICD-10-CM

## 2018-04-11 LAB — POCT INR: INR: 2.2 (ref 2.0–3.0)

## 2018-04-11 NOTE — Patient Instructions (Signed)
Description   Continue taking 1 and 1/2 tablets every day except 2 tablets on Sundays.  Recheck in 4 weeks. Continue eating your leafy green vegetables 3 servings each week.  Call Coumadin Clinic with any new or different medications or procedures #  [phone] Pt using pill box now.

## 2018-05-09 ENCOUNTER — Ambulatory Visit (INDEPENDENT_AMBULATORY_CARE_PROVIDER_SITE_OTHER): Payer: Medicare HMO | Admitting: *Deleted

## 2018-05-09 DIAGNOSIS — Z5181 Encounter for therapeutic drug level monitoring: Secondary | ICD-10-CM | POA: Diagnosis not present

## 2018-05-09 DIAGNOSIS — Z950 Presence of cardiac pacemaker: Secondary | ICD-10-CM | POA: Diagnosis not present

## 2018-05-09 DIAGNOSIS — I471 Supraventricular tachycardia, unspecified: Secondary | ICD-10-CM

## 2018-05-09 LAB — POCT INR: INR: 1.3 — AB (ref 2.0–3.0)

## 2018-05-09 NOTE — Patient Instructions (Signed)
Description   Today and tomorrow take 2 tablets then Continue taking 1 and 1/2 tablets every day except 2 tablets on Sundays.  Recheck in 7-10 days.  Continue eating your leafy green vegetables 3 servings each week.  Call Coumadin Clinic with any new or different medications or procedures #  [phone] Pt using pill box now.

## 2018-05-13 ENCOUNTER — Telehealth: Payer: Self-pay | Admitting: Internal Medicine

## 2018-05-13 NOTE — Telephone Encounter (Signed)
   Pt c/o swelling: STAT is pt has developed SOB within 24 hours  1) How much weight have you gained and in what time span? no  2) If swelling, where is the swelling located? Legs and feet  3) Are you currently taking a fluid pill? yes  4) Are you currently SOB? no  5) Do you have a log of your daily weights (if so, list)? no  6) Have you gained 3 pounds in a day or 5 pounds in a week? no  7) Have you traveled recently? No  Patient is requesting an order for a hospital bed to be sent to [facility] due to swelling in her legs and feet.

## 2018-05-13 NOTE — Telephone Encounter (Signed)
Called patient about her message. Patient complaining of BLE swelling. Patient has not been taking her lasix. Encouraged patient to take her lasix to help with the swelling. Patient also requesting an order for a hospital bed to be sent to [facility]. Informed patient that she should call her PCP for orders for [place]. Patient has not been seen in over a year, and she is over due for follow-up. Made patient an appointment with Richardson Dopp PA. Will send a message to device clinic to see if patient can get seen with them this week as well.

## 2018-05-13 NOTE — Telephone Encounter (Signed)
Due to see GT in July. Given to [name] to schedule Physician Pacer Check next available. No HF diagnostics available on her pacemaker.

## 2018-05-14 ENCOUNTER — Ambulatory Visit: Payer: Medicare HMO | Admitting: Physician Assistant

## 2018-05-14 ENCOUNTER — Encounter: Payer: Self-pay | Admitting: Pulmonary Disease

## 2018-05-14 ENCOUNTER — Ambulatory Visit (INDEPENDENT_AMBULATORY_CARE_PROVIDER_SITE_OTHER): Payer: Medicare HMO | Admitting: Pulmonary Disease

## 2018-05-14 VITALS — BP 122/84 | HR 94 | Temp 97.3°F | Ht 67.5 in | Wt 272.8 lb

## 2018-05-14 DIAGNOSIS — J4530 Mild persistent asthma, uncomplicated: Secondary | ICD-10-CM

## 2018-05-14 DIAGNOSIS — I110 Hypertensive heart disease with heart failure: Secondary | ICD-10-CM | POA: Diagnosis not present

## 2018-05-14 DIAGNOSIS — I5032 Chronic diastolic (congestive) heart failure: Secondary | ICD-10-CM | POA: Diagnosis not present

## 2018-05-14 DIAGNOSIS — J309 Allergic rhinitis, unspecified: Secondary | ICD-10-CM

## 2018-05-14 DIAGNOSIS — Z23 Encounter for immunization: Secondary | ICD-10-CM

## 2018-05-14 MED ORDER — MONTELUKAST SODIUM 10 MG PO TABS: ORAL_TABLET | ORAL | 0 refills | Status: DC

## 2018-05-14 MED ORDER — OMEPRAZOLE 20 MG PO CPDR: 20.0000 mg | DELAYED_RELEASE_CAPSULE | Freq: Every day | ORAL | 5 refills | Status: DC

## 2018-05-14 MED ORDER — BUDESONIDE-FORMOTEROL FUMARATE 80-4.5 MCG/ACT IN AERO: 2.0000 | INHALATION_SPRAY | Freq: Two times a day (BID) | RESPIRATORY_TRACT | 0 refills | Status: DC

## 2018-05-14 MED ORDER — ALBUTEROL SULFATE HFA 108 (90 BASE) MCG/ACT IN AERS: 2.0000 | INHALATION_SPRAY | Freq: Four times a day (QID) | RESPIRATORY_TRACT | 6 refills | Status: DC | PRN

## 2018-05-14 NOTE — Progress Notes (Signed)
@Patient  ID: [name], female    DOB: February 16, 1941, 76 y.o.   MRN: [id number]  Chief Complaint  Patient presents with  . Acute Visit    SOB     Referring provider: Dixie Dials, MD  HPI:  77 year old female former smoker followed in our office for asthma.  PMH: Morbid obesity, hypertension, obesity Smoker/ Smoking History: Former smoker.  4-pack-year history. Maintenance: Dulera 100  pt of: Dr. Melvyn Novas   Recent [facility] Encounters:   Last seen in the office visit in 2017  05/14/2018  - Visit   77 year old female patient presenting today for acute visit.  Patient reports she is had increased lower extremity swelling over the past 6 months.  Patient reports nonadherence to her fluid pill as well as her blood pressure medications.  Patient is also not been taking her Dulera inhaler for at least the last 6 months.  Patient reports that she has not used her rescue inhaler months.  Patient does not know where her rescue inhaler is.  Patient does report that she is had chronic orthopnea.  Patient is not following a low-sodium diet.  Patient eats out and has fast food often.  Patient reports that she does not add salt to her meals.  Patient is accompanied today by her goddaughter who is now helping patient manage her health.  Patient is aware that she is not been adherent to her plan of care has scheduled follow-ups with her providers to get back on track.  Patient reports she will see Dr. Terrence Dupont tomorrow on 05/15/2018 for management of her blood pressure as well as her lower extremity swelling and fluid pill.   Tests:  04/29/2015-FVC 2.0 (72% predicted) ratio 73, FEV1 71% >>>mild restriction  01/05/2015-echocardiogram-LV ejection fraction 50 to 62%, grade 1 diastolic dysfunction    Specialty Problems      Pulmonary Problems   Allergic rhinitis    Stopped allergy vaccine 208       Mild persistent asthma    Followed in Pulmonary clinic/ [facility]/ [place] -  spirometry 02/02/15 > restrictive only  - added back singulair 02/03/2015 >>>  - spirometry 04/29/2015 with low mid flows only  - 11/15/2015   resume dulera 100  - Allergy profile  11/15/15 >  Eos 0.2 /  IgE  651  Dust/roach > grass   -med calendar 12/10/2015 > did not recognize copy 03/13/2016 , 03/28/16  - 03/13/2016    re-try dulera 100 2bid - 07/03/2016  After extensive coaching HFA effectiveness =    90%          Dyspnea    Spirometry 02/02/15 restrictive only  - 04/29/2015  Walked RA x 3 laps @ 185 ft each stopped due to  End of study, nl pace, no sob or desat           Allergies  Allergen Reactions  . Albuterol Other (See Comments)    irregular heartbeat    Immunization History  Administered Date(s) Administered  . Influenza Split 05/01/2012  . Influenza Whole 05/13/2008, 07/28/2010  . Influenza, High Dose Seasonal PF 05/14/2018   >>> Patient reports she has had pneumonia vaccines in the past at [facility], patient to follow-up with [facility] and contact our office with her records  >>>High-dose flu vaccine today  Past Medical History:  Diagnosis Date  . Allergic rhinitis   . Anemia    hx due to med  . Arthritis   . Asthma   .  CHF (congestive heart failure) ([place])   . Emphysema of lung ([place])   . Hyperlipidemia   . Hypertension   . Pacemaker   . PAT (paroxysmal atrial tachycardia) ([place]) 2008  . Pneumonia    hx  . 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 Packs/day: 1.00  . Years: 4.00  . Pack years: 4.00  . Types: Cigarettes  . Last attempt to quit: 08/07/1977  . Years since quitting: 40.7  Smokeless Tobacco Never Used   Counseling given: Yes  Continue not smoking  Outpatient Encounter Medications as of 05/14/2018  Medication Sig  . Bromfenac Sodium (PROLENSA) 0.07 % SOLN Apply to eye as directed.  . budesonide-formoterol (SYMBICORT) 80-4.5  MCG/ACT inhaler Inhale 2 puffs into the lungs 2 (two) times daily.  . furosemide (LASIX) 40 MG tablet Take 40 mg by mouth 2 (two) times daily. Patient takes 2 pills once a day around 1000  . Propylene Glycol-Glycerin (SOOTHE) 0.6-0.6 % SOLN Apply to eye as directed.  . valsartan (DIOVAN) 160 MG tablet Take 160 mg by mouth daily.  Marland Kitchen warfarin (COUMADIN) 3 MG tablet TAKE 1 & 1/2-2 TABLETS BY MOUTH DAILY OR AS DIRECTED BY COUMADIN CLINIC  . albuterol (PROVENTIL HFA;VENTOLIN HFA) 108 (90 Base) MCG/ACT inhaler Inhale 2 puffs into the lungs every 6 (six) hours as needed for wheezing or shortness of breath.  . bisoprolol (ZEBETA) 5 MG tablet TAKE ONE TABLET BY MOUTH ONCE DAILY (Patient not taking: Reported on 05/14/2018)  . HYDROcodone-acetaminophen (NORCO/VICODIN) 5-325 MG tablet Take 1 tablet by mouth every 4 (four) hours as needed. (Patient not taking: Reported on 05/14/2018)  . montelukast (SINGULAIR) 10 MG tablet TAKE ONE TABLET BY MOUTH AT BEDTIME EVERY NIGHT  . omeprazole (PRILOSEC) 20 MG capsule Take 1 capsule (20 mg total) by mouth daily. Take 30-60 min before first meal of the day  . spironolactone (ALDACTONE) 25 MG tablet TAKE ONE TABLET BY MOUTH ONCE DAILY (Patient not taking: Reported on 05/14/2018)  . [DISCONTINUED] albuterol (PROVENTIL HFA;VENTOLIN HFA) 108 (90 Base) MCG/ACT inhaler Inhale 2 puffs into the lungs every 6 (six) hours as needed for wheezing or shortness of breath.  . [DISCONTINUED] albuterol (PROVENTIL HFA;VENTOLIN HFA) 108 (90 Base) MCG/ACT inhaler Inhale 2 puffs into the lungs every 6 (six) hours as needed for wheezing or shortness of breath. PATIENT NEEDS VENTOLIN HFA NOT ALBUTEROL PLEASE  . [DISCONTINUED] mometasone-formoterol (DULERA) 100-5 MCG/ACT AERO Inhale 2 puffs into the lungs 2 (two) times daily. (Patient not taking: Reported on 05/14/2018)  . [DISCONTINUED] montelukast (SINGULAIR) 10 MG tablet TAKE ONE TABLET BY MOUTH AT BEDTIME EVERY NIGHT (Patient not taking: Reported on  05/14/2018)  . [DISCONTINUED] omeprazole (PRILOSEC) 20 MG capsule Take 20 mg by mouth daily. Take 30-60 min before first meal of the day   No facility-administered encounter medications on file as of 05/14/2018.      Review of Systems  Review of Systems  Constitutional: Positive for fatigue. Negative for chills, fever and unexpected weight change.  HENT: Positive for congestion. Negative for ear pain, postnasal drip, sinus pressure and sinus pain.   Respiratory: Positive for cough (yellow mucous ), shortness of breath and wheezing. Negative for chest tightness.        +Orthopnea   Cardiovascular: Positive for leg swelling. Negative for chest pain and palpitations.  Gastrointestinal: Negative for blood in stool, diarrhea,  nausea and vomiting.  Genitourinary: Negative for dysuria, frequency and urgency.  Musculoskeletal: Negative for arthralgias.  Skin: Negative for color change.  Allergic/Immunologic: Positive for environmental allergies. Negative for food allergies.  Neurological: Negative for dizziness, light-headedness and headaches.  Psychiatric/Behavioral: Negative for dysphoric mood. The patient is not nervous/anxious.   All other systems reviewed and are negative.    Physical Exam  BP 122/84 (BP Location: Left Arm, Cuff Size: Normal)   Pulse 94   Temp (!) 97.3 F (36.3 C) (Oral)   Ht 5' 7.5" (1.715 m)   Wt 272 lb 12.8 oz (123.7 kg)   SpO2 95%   BMI 42.10 kg/m   Wt Readings from Last 5 Encounters:  05/14/18 272 lb 12.8 oz (123.7 kg)  12/26/17 283 lb 6.4 oz (128.5 kg)  10/24/16 293 lb 3.2 oz (133 kg)  07/03/16 286 lb (129.7 kg)  05/03/16 279 lb 9.6 oz (126.8 kg)    Physical Exam  Constitutional: She is oriented to person, place, and time and well-developed, well-nourished, and in no distress. No distress.  HENT:  Head: Normocephalic and atraumatic.  Right Ear: Hearing, tympanic membrane, external ear and ear canal normal.  Left Ear: Hearing, tympanic membrane,  external ear and ear canal normal.  Nose: Nose normal. Right sinus exhibits no maxillary sinus tenderness and no frontal sinus tenderness. Left sinus exhibits no maxillary sinus tenderness and no frontal sinus tenderness.  Mouth/Throat: Uvula is midline and oropharynx is clear and moist. No oropharyngeal exudate.  Eyes: Pupils are equal, round, and reactive to light.  Neck: Normal range of motion. Neck supple. No JVD present.  Cardiovascular: Normal rate, regular rhythm and normal heart sounds.  Pulmonary/Chest: Effort normal and breath sounds normal. No accessory muscle usage. No respiratory distress. She has no decreased breath sounds. She has no wheezes. She has no rhonchi.  Abdominal: Soft. Bowel sounds are normal. There is no tenderness.  Musculoskeletal: Normal range of motion. She exhibits edema (2-3+ LE edema bilaterally ).  Lymphadenopathy:    She has no cervical adenopathy.  Neurological: She is alert and oriented to person, place, and time. Gait normal.  Skin: Skin is warm and dry. She is not diaphoretic. No erythema.  Psychiatric: Mood, memory, affect and judgment normal.  Nursing note and vitals reviewed.     Lab Results:  CBC    Component Value Date/Time   WBC 12.0 (H) 02/16/2017 1044   RBC 3.84 (L) 02/16/2017 1044   HGB 11.3 (L) 02/16/2017 1044   HCT 35.6 (L) 02/16/2017 1044   PLT 237 02/16/2017 1044   MCV 92.7 02/16/2017 1044   MCH 29.4 02/16/2017 1044   MCHC 31.7 02/16/2017 1044   RDW 14.1 02/16/2017 1044   LYMPHSABS 1.5 02/16/2017 1044   MONOABS 0.7 02/16/2017 1044   EOSABS 0.0 02/16/2017 1044   BASOSABS 0.0 02/16/2017 1044    BMET    Component Value Date/Time   NA 139 02/16/2017 1044   K 4.3 02/16/2017 1044   CL 111 02/16/2017 1044   CO2 19 (L) 02/16/2017 1044   GLUCOSE 137 (H) 02/16/2017 1044   BUN 15 02/16/2017 1044   CREATININE 1.14 (H) 02/16/2017 1044   CREATININE 1.08 05/05/2011 1536   CALCIUM 8.5 (L) 02/16/2017 1044   GFRNONAA 45 (L)  02/16/2017 1044   GFRNONAA 50 (L) 05/05/2011 1536   GFRAA 52 (L) 02/16/2017 1044   GFRAA 60 (L) 05/05/2011 1536    BNP    Component Value Date/Time   BNP 45.7  01/04/2015 1620    ProBNP    Component Value Date/Time   PROBNP 23.0 11/15/2015 1520      Assessment & Plan:   Pleasant 77 year old patient seen office visit today.  Unfortunately patient has been noncompliant plan of care.  Patient reports she is only been taking her fluid pill 40 mg every other day instead of 80 mg daily.  Unfortunately we do not have a Dulera sample to start patient back on.  Will have patient start Symbicort 80 in the meantime.  Patient to keep follow-up with Dr. Terrence Dupont tomorrow (05/15/2018).  Patient will discuss with Dr. Terrence Dupont her fluid pill management as well as what blood pressure medication she needs to continue.  We will restart omeprazole 20 mg daily.  Will restart Singulair 10 mg daily at night.  We will have patient follow-up with Dr. Melvyn Novas in 4 weeks.  High-dose flu vaccine today.  Patient reports she is had the pneumonia vaccines before at [facility].  Patient to follow-up with [facility] and follow back up with our office with her recorded dates.  Patient is interested in a hospital bed.  Patient will follow-up with primary care regarding this.  Mild persistent asthma High-dose flu vaccine today  Start Symbicort 80 >>> 2 puffs in the morning right when you wake up, rinse out your mouth after use, 12 hours later 2 puffs, rinse after use >>> Take this daily, no matter what >>> This is not a rescue inhaler  >>>this replaces your Dulera 100  Continue Singulair 10 mg daily at night  Continue omeprazole 20 mg 30 to 60 minutes before first meal the day    Allergic rhinitis  Continue Singulair 10 mg daily at night  Consider starting daily antihistamine such as Zyrtec or the generic >>>Can purchase this over-the-counter >>> Take daily  Chronic diastolic heart failure  ([place])  Please take your blood pressure medications as prescribed Continue with follow-up with Dr. Terrence Dupont tomorrow  Follow low-sodium diet >>>Reviewed literature listed below  Weigh yourself daily >>>Wears off in the morning after going to the bathroom >>>Record these results  Elevate your legs when able   Lauraine Rinne, NP 05/14/2018

## 2018-05-14 NOTE — Assessment & Plan Note (Signed)
  Continue Singulair 10 mg daily at night  Consider starting daily antihistamine such as Zyrtec or the generic >>>Can purchase this over-the-counter >>> Take daily

## 2018-05-14 NOTE — Progress Notes (Signed)
Chart and office note reviewed in detail  > agree with a/p as outlined    

## 2018-05-14 NOTE — Progress Notes (Deleted)
Cardiology Office Note:    Date:  05/14/2018   ID:  [name], DOB [birth year], MRN [id number]  PCP:  Dixie Dials, MD  Cardiologist/Electrophysiologist:  Cristopher Peru, MD    Referring MD: Dixie Dials, MD   No chief complaint on file. ***  History of Present Illness:    [name] is a 77 y.o. female with diastolic heart failure, tachybradycardia syndrome, status post pacemaker, hypertension, paroxysmal atrial tachycardia versus atrial flutter, chronic Coumadin therapy, COPD.  Last seen by Dr. Lovena Le 10/2016.  ***  [name] ***  Prior CV studies:   The following studies were reviewed today:  Echo 01/05/2015 Mild concentric LVH, EF 50-55, normal wall motion, grade 1 diastolic dysfunction, MAC, mild LAE  Past Medical History:  Diagnosis Date  . Allergic rhinitis   . Anemia    hx due to med  . Arthritis   . Asthma   . CHF (congestive heart failure) ([place])   . Emphysema of lung ([place])   . Hyperlipidemia   . Hypertension   . Pacemaker   . PAT (paroxysmal atrial tachycardia) ([place]) 2008  . Pneumonia    hx  . 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   Current Medications: No outpatient medications have been marked as taking for the 05/14/18 encounter (Appointment) with Richardson Dopp T, PA-C.     Allergies:   Albuterol   Social History   Tobacco Use  . Smoking status: Former Smoker    Packs/day: 1.00    Years: 4.00    Pack years: 4.00    Types: Cigarettes    Last attempt to quit: 08/07/1977    Years since quitting: 40.7  . Smokeless tobacco: Never Used  Substance Use Topics  . Alcohol use: No    Alcohol/week: 0.0 standard drinks    Comment: quit 1979  . Drug use: No     Family Hx: The patient's family history includes Cancer in her  sister; Heart disease in her mother.  ROS:   Please see the history of present illness.    ROS All other systems reviewed and are negative.   EKGs/Labs/Other Test Reviewed:    EKG:  EKG is *** ordered today.  The ekg ordered today demonstrates ***  Recent Labs: No results found for requested labs within last 8760 hours.   Recent Lipid Panel No results found for: CHOL, TRIG, HDL, CHOLHDL, LDLCALC, LDLDIRECT  Physical Exam:    VS:  There were no vitals taken for this visit.    Wt Readings from Last 3 Encounters:  12/26/17 283 lb 6.4 oz (128.5 kg)  10/24/16 293 lb 3.2 oz (133 kg)  07/03/16 286 lb (129.7 kg)     ***Physical Exam  ASSESSMENT & PLAN:    Chronic diastolic heart failure (HCC)  Essential hypertension  Pacemaker-[employer]  PAROXYSMAL ATRIAL TACHYCARDIA***  Dispo:  No follow-ups on file.   Medication Adjustments/Labs and Tests Ordered: Current medicines are reviewed at length with the patient today.  Concerns regarding medicines are outlined above.  Tests Ordered: No orders of the defined types were placed in this encounter.  Medication Changes: No orders of the defined types were placed in this encounter.   Signed, Richardson Dopp, PA-C  05/14/2018 1:57 PM    [facility] [place], [place], [place]  [address] Phone: [phone]; Fax: [phone]

## 2018-05-14 NOTE — Addendum Note (Signed)
Addended by: Lauraine Rinne on: 05/14/2018 09:25 PM   Modules accepted: Orders

## 2018-05-14 NOTE — Assessment & Plan Note (Signed)
  Please take your blood pressure medications as prescribed Continue with follow-up with Dr. Terrence Dupont tomorrow  Follow low-sodium diet >>>Reviewed literature listed below  Weigh yourself daily >>>Wears off in the morning after going to the bathroom >>>Record these results  Elevate your legs when able

## 2018-05-14 NOTE — Assessment & Plan Note (Signed)
High-dose flu vaccine today  Start Symbicort 80 >>> 2 puffs in the morning right when you wake up, rinse out your mouth after use, 12 hours later 2 puffs, rinse after use >>> Take this daily, no matter what >>> This is not a rescue inhaler  >>>this replaces your Dulera 100  Continue Singulair 10 mg daily at night  Continue omeprazole 20 mg 30 to 60 minutes before first meal the day

## 2018-05-14 NOTE — Patient Instructions (Addendum)
High-dose flu vaccine today  Start Symbicort 80 >>> 2 puffs in the morning right when you wake up, rinse out your mouth after use, 12 hours later 2 puffs, rinse after use >>> Take this daily, no matter what >>> This is not a rescue inhaler  >>>this replaces your Dulera 100  Continue Singulair 10 mg daily at night  Continue omeprazole 20 mg 30 to 60 minutes before first meal the day  Please take your blood pressure medications as prescribed Continue with follow-up with Dr. Terrence Dupont tomorrow  Follow low-sodium diet >>>Reviewed literature listed below  Weigh yourself daily >>>Wears off in the morning after going to the bathroom >>>Record these results  Elevate your legs when able      November/2019 we will be moving! We will no longer be at our [place] location.  Be on the look out for a post card/mailer to let you know we have officially moved.  Our new address and phone number will be:  [address] [place], [place] [address] Telephone number: [phone]  It is flu season:   >>>Remember to be washing your hands regularly, using hand sanitizer, be careful to use around herself with has contact with people who are sick will increase her chances of getting sick yourself. >>> Best ways to protect herself from the flu: Receive the yearly flu vaccine, practice good hand hygiene washing with soap and also using hand sanitizer when available, eat a nutritious meals, get adequate rest, hydrate appropriately   Please contact the office if your symptoms worsen or you have concerns that you are not improving.   Thank you for choosing [facility] for your healthcare, and for allowing [place] to partner with you on your healthcare journey. I am thankful to be able to provide care to you today.   Wyn Quaker FNP-C    DASH Eating Plan DASH stands for "Dietary Approaches to Stop Hypertension." The DASH eating plan is a healthy eating plan that has been shown to reduce  high blood pressure (hypertension). It may also reduce your risk for type 2 diabetes, heart disease, and stroke. The DASH eating plan may also help with weight loss. What are tips for following this plan? General guidelines  Avoid eating more than 2,300 mg (milligrams) of salt (sodium) a day. If you have hypertension, you may need to reduce your sodium intake to 1,500 mg a day.  Limit alcohol intake to no more than 1 drink a day for nonpregnant women and 2 drinks a day for men. One drink equals 12 oz of beer, 5 oz of wine, or 1 oz of hard liquor.  Work with your health care provider to maintain a healthy body weight or to lose weight. Ask what an ideal weight is for you.  Get at least 30 minutes of exercise that causes your heart to beat faster (aerobic exercise) most days of the week. Activities may include walking, swimming, or biking.  Work with your health care provider or diet and nutrition specialist (dietitian) to adjust your eating plan to your individual calorie needs. Reading food labels  Check food labels for the amount of sodium per serving. Choose foods with less than 5 percent of the Daily Value of sodium. Generally, foods with less than 300 mg of sodium per serving fit into this eating plan.  To find whole grains, look for the word "whole" as the first word in the ingredient list. Shopping  Buy products labeled as "low-sodium" or "no  salt added."  Buy fresh foods. Avoid canned foods and premade or frozen meals. Cooking  Avoid adding salt when cooking. Use salt-free seasonings or herbs instead of table salt or sea salt. Check with your health care provider or pharmacist before using salt substitutes.  Do not fry foods. Cook foods using healthy methods such as baking, boiling, grilling, and broiling instead.  Cook with heart-healthy oils, such as olive, canola, soybean, or sunflower oil. Meal planning   Eat a balanced diet that includes: ? 5 or more servings of fruits  and vegetables each day. At each meal, try to fill half of your plate with fruits and vegetables. ? Up to 6-8 servings of whole grains each day. ? Less than 6 oz of lean meat, poultry, or fish each day. A 3-oz serving of meat is about the same size as a deck of cards. One egg equals 1 oz. ? 2 servings of low-fat dairy each day. ? A serving of nuts, seeds, or beans 5 times each week. ? Heart-healthy fats. Healthy fats called Omega-3 fatty acids are found in foods such as flaxseeds and coldwater fish, like sardines, salmon, and mackerel.  Limit how much you eat of the following: ? Canned or prepackaged foods. ? Food that is high in trans fat, such as fried foods. ? Food that is high in saturated fat, such as fatty meat. ? Sweets, desserts, sugary drinks, and other foods with added sugar. ? Full-fat dairy products.  Do not salt foods before eating.  Try to eat at least 2 vegetarian meals each week.  Eat more home-cooked food and less restaurant, buffet, and fast food.  When eating at a restaurant, ask that your food be prepared with less salt or no salt, if possible. What foods are recommended? The items listed may not be a complete list. Talk with your dietitian about what dietary choices are best for you. Grains Whole-grain or whole-wheat bread. Whole-grain or whole-wheat pasta. Brown rice. [place]. Bulgur. Whole-grain and low-sodium cereals. Pita bread. Low-fat, low-sodium crackers. Whole-wheat flour tortillas. Vegetables Fresh or frozen vegetables (raw, steamed, roasted, or grilled). Low-sodium or reduced-sodium tomato and vegetable juice. Low-sodium or reduced-sodium tomato sauce and tomato paste. Low-sodium or reduced-sodium canned vegetables. Fruits All fresh, dried, or frozen fruit. Canned fruit in natural juice (without added sugar). Meat and other protein foods Skinless chicken or [place]. Ground chicken or [place]. Pork with fat trimmed off. Fish and seafood. Egg whites.  Dried beans, peas, or lentils. Unsalted nuts, nut butters, and seeds. Unsalted canned beans. Lean cuts of beef with fat trimmed off. Low-sodium, lean deli meat. Dairy Low-fat (1%) or fat-free (skim) milk. Fat-free, low-fat, or reduced-fat cheeses. Nonfat, low-sodium ricotta or cottage cheese. Low-fat or nonfat yogurt. Low-fat, low-sodium cheese. Fats and oils Soft margarine without trans fats. Vegetable oil. Low-fat, reduced-fat, or light mayonnaise and salad dressings (reduced-sodium). Canola, safflower, olive, soybean, and sunflower oils. Avocado. Seasoning and other foods Herbs. Spices. Seasoning mixes without salt. Unsalted popcorn and pretzels. Fat-free sweets. What foods are not recommended? The items listed may not be a complete list. Talk with your dietitian about what dietary choices are best for you. Grains Baked goods made with fat, such as croissants, muffins, or some breads. Dry pasta or rice meal packs. Vegetables Creamed or fried vegetables. Vegetables in a cheese sauce. Regular canned vegetables (not low-sodium or reduced-sodium). Regular canned tomato sauce and paste (not low-sodium or reduced-sodium). Regular tomato and vegetable juice (not low-sodium or reduced-sodium). Angie Fava. Olives. Fruits  Canned fruit in a light or heavy syrup. Fried fruit. Fruit in cream or butter sauce. Meat and other protein foods Fatty cuts of meat. Ribs. Fried meat. Berniece Salines. Sausage. Bologna and other processed lunch meats. Salami. Fatback. Hotdogs. Bratwurst. Salted nuts and seeds. Canned beans with added salt. Canned or smoked fish. Whole eggs or egg yolks. Chicken or [place] with skin. Dairy Whole or 2% milk, cream, and half-and-half. Whole or full-fat cream cheese. Whole-fat or sweetened yogurt. Full-fat cheese. Nondairy creamers. Whipped toppings. Processed cheese and cheese spreads. Fats and oils Butter. Stick margarine. Lard. Shortening. Ghee. Bacon fat. Tropical oils, such as coconut, palm  kernel, or palm oil. Seasoning and other foods Salted popcorn and pretzels. Onion salt, garlic salt, seasoned salt, table salt, and sea salt. Worcestershire sauce. Tartar sauce. Barbecue sauce. Teriyaki sauce. Soy sauce, including reduced-sodium. Steak sauce. Canned and packaged gravies. Fish sauce. Oyster sauce. Cocktail sauce. Horseradish that you find on the shelf. Ketchup. Mustard. Meat flavorings and tenderizers. Bouillon cubes. Hot sauce and Tabasco sauce. Premade or packaged marinades. Premade or packaged taco seasonings. Relishes. Regular salad dressings. Where to find more information:  National Heart, Lung, and [place]: https://wilson-eaton.com/  American Heart Association: www.heart.org Summary  The DASH eating plan is a healthy eating plan that has been shown to reduce high blood pressure (hypertension). It may also reduce your risk for type 2 diabetes, heart disease, and stroke.  With the DASH eating plan, you should limit salt (sodium) intake to 2,300 mg a day. If you have hypertension, you may need to reduce your sodium intake to 1,500 mg a day.  When on the DASH eating plan, aim to eat more fresh fruits and vegetables, whole grains, lean proteins, low-fat dairy, and heart-healthy fats.  Work with your health care provider or diet and nutrition specialist (dietitian) to adjust your eating plan to your individual calorie needs. This information is not intended to replace advice given to you by your health care provider. Make sure you discuss any questions you have with your health care provider. Document Released: 07/13/2011 Document Revised: 07/17/2016 Document Reviewed: 07/17/2016 Elsevier Interactive Patient Education  2018 [place] for Gastroesophageal Reflux Disease, Adult When you have gastroesophageal reflux disease (GERD), the foods you eat and your eating habits are very important. Choosing the right foods can help ease your discomfort. What  guidelines do I need to follow?  Choose fruits, vegetables, whole grains, and low-fat dairy products.  Choose low-fat meat, fish, and poultry.  Limit fats such as oils, salad dressings, butter, nuts, and avocado.  Keep a food diary. This helps you identify foods that cause symptoms.  Avoid foods that cause symptoms. These may be different for everyone.  Eat small meals often instead of 3 large meals a day.  Eat your meals slowly, in a place where you are relaxed.  Limit fried foods.  Cook foods using methods other than frying.  Avoid drinking alcohol.  Avoid drinking large amounts of liquids with your meals.  Avoid bending over or lying down until 2-3 hours after eating. What foods are not recommended? These are some foods and drinks that may make your symptoms worse: Vegetables Tomatoes. Tomato juice. Tomato and spaghetti sauce. Chili peppers. Onion and garlic. Horseradish. Fruits Oranges, grapefruit, and lemon (fruit and juice). Meats High-fat meats, fish, and poultry. This includes hot dogs, ribs, ham, sausage, salami, and bacon. Dairy Whole milk and chocolate milk. Sour cream. Cream. Butter. Ice cream.  Cream cheese. Drinks Coffee and tea. Bubbly (carbonated) drinks or energy drinks. Condiments Hot sauce. Barbecue sauce. Sweets/Desserts Chocolate and cocoa. Donuts. Peppermint and spearmint. Fats and Oils High-fat foods. This includes [place] fries and potato chips. Other Vinegar. Strong spices. This includes black pepper, white pepper, red pepper, cayenne, curry powder, cloves, ginger, and chili powder. The items listed above may not be a complete list of foods and drinks to avoid. Contact your dietitian for more information. This information is not intended to replace advice given to you by your health care provider. Make sure you discuss any questions you have with your health care provider. Document Released: 01/23/2012 Document Revised: 12/30/2015 Document  Reviewed: 05/28/2013 Elsevier Interactive Patient Education  2017 [name].      Gastroesophageal Reflux Disease, Adult Normally, food travels down the esophagus and stays in the stomach to be digested. If a person has gastroesophageal reflux disease (GERD), food and stomach acid move back up into the esophagus. When this happens, the esophagus becomes sore and swollen (inflamed). Over time, GERD can make small holes (ulcers) in the lining of the esophagus. Follow these instructions at home: Diet  Follow a diet as told by your doctor. You may need to avoid foods and drinks such as: ? Coffee and tea (with or without caffeine). ? Drinks that contain alcohol. ? Energy drinks and sports drinks. ? Carbonated drinks or sodas. ? Chocolate and cocoa. ? Peppermint and mint flavorings. ? Garlic and onions. ? Horseradish. ? Spicy and acidic foods, such as peppers, chili powder, curry powder, vinegar, hot sauces, and BBQ sauce. ? Citrus fruit juices and citrus fruits, such as oranges, lemons, and limes. ? Tomato-based foods, such as red sauce, chili, salsa, and pizza with red sauce. ? Fried and fatty foods, such as donuts, french fries, potato chips, and high-fat dressings. ? High-fat meats, such as hot dogs, rib eye steak, sausage, ham, and bacon. ? High-fat dairy items, such as whole milk, butter, and cream cheese.  Eat small meals often. Avoid eating large meals.  Avoid drinking large amounts of liquid with your meals.  Avoid eating meals during the 2-3 hours before bedtime.  Avoid lying down right after you eat.  Do not exercise right after you eat. General instructions  Pay attention to any changes in your symptoms.  Take over-the-counter and prescription medicines only as told by your doctor. Do not take aspirin, ibuprofen, or other NSAIDs unless your doctor says it is okay.  Do not use any tobacco products, including cigarettes, chewing tobacco, and e-cigarettes. If you  need help quitting, ask your doctor.  Wear loose clothes. Do not wear anything tight around your waist.  Raise (elevate) the head of your bed about 6 inches (15 cm).  Try to lower your stress. If you need help doing this, ask your doctor.  If you are overweight, lose an amount of weight that is healthy for you. Ask your doctor about a safe weight loss goal.  Keep all follow-up visits as told by your doctor. This is important. Contact a doctor if:  You have new symptoms.  You lose weight and you do not know why it is happening.  You have trouble swallowing, or it hurts to swallow.  You have wheezing or a cough that keeps happening.  Your symptoms do not get better with treatment.  You have a hoarse voice. Get help right away if:  You have pain in your arms, neck, jaw, teeth, or back.  You feel  sweaty, dizzy, or light-headed.  You have chest pain or shortness of breath.  You throw up (vomit) and your throw up looks like blood or coffee grounds.  You pass out (faint).  Your poop (stool) is bloody or black.  You cannot swallow, drink, or eat. This information is not intended to replace advice given to you by your health care provider. Make sure you discuss any questions you have with your health care provider. Document Released: 01/10/2008 Document Revised: 12/30/2015 Document Reviewed: 11/18/2014 Elsevier Interactive Patient Education  2018 [employer].    Daily Weight Record It is important to weigh yourself daily. Keep this daily weight chart near your scale. Weigh yourself each morning at the same time. Weigh yourself without shoes, and wear the same amount of clothing each day. Compare today's weight to yesterday's weight. Bring this form with you to your follow-up appointments. Call your health care provider if you have concerns about your weight, including rapid weight gain or rapid weight loss. Date: ________ Weight: ____________________ Date: ________ Weight:  ____________________ Date: ________ Weight: ____________________ Date: ________ Weight: ____________________ Date: ________ Weight: ____________________ Date: ________ Weight: ____________________ Date: ________ Weight: ____________________ Date: ________ Weight: ____________________ Date: ________ Weight: ____________________ Date: ________ Weight: ____________________ Date: ________ Weight: ____________________ Date: ________ Weight: ____________________ Date: ________ Weight: ____________________ Date: ________ Weight: ____________________ Date: ________ Weight: ____________________ Date: ________ Weight: ____________________ Date: ________ Weight: ____________________ Date: ________ Weight: ____________________ Date: ________ Weight: ____________________ Date: ________ Weight: ____________________ Date: ________ Weight: ____________________ Date: ________ Weight: ____________________ Date: ________ Weight: ____________________ Date: ________ Weight: ____________________ Date: ________ Weight: ____________________ Date: ________ Weight: ____________________ Date: ________ Weight: ____________________ Date: ________ Weight: ____________________ Date: ________ Weight: ____________________ Date: ________ Weight: ____________________ Date: ________ Weight: ____________________ Date: ________ Weight: ____________________ Date: ________ Weight: ____________________ Date: ________ Weight: ____________________ Date: ________ Weight: ____________________ Date: ________ Weight: ____________________ Date: ________ Weight: ____________________ Date: ________ Weight: ____________________ Date: ________ Weight: ____________________ Date: ________ Weight: ____________________ Date: ________ Weight: ____________________ Date: ________ Weight: ____________________ Date: ________ Weight: ____________________ Date: ________ Weight: ____________________ Date: ________ Weight: ____________________ Date: ________  Weight: ____________________ Date: ________ Weight: ____________________ Date: ________ Weight: ____________________ Date: ________ Weight: ____________________ Date: ________ Weight: ____________________ This information is not intended to replace advice given to you by your health care provider. Make sure you discuss any questions you have with your health care provider. Document Released: 10/05/2006 Document Revised: 07/07/2016 Document Reviewed: 02/20/2014 Elsevier Interactive Patient Education  [employer].

## 2018-05-16 ENCOUNTER — Ambulatory Visit (INDEPENDENT_AMBULATORY_CARE_PROVIDER_SITE_OTHER): Payer: Medicare HMO | Admitting: *Deleted

## 2018-05-16 DIAGNOSIS — M1990 Unspecified osteoarthritis, unspecified site: Secondary | ICD-10-CM | POA: Diagnosis not present

## 2018-05-16 DIAGNOSIS — I471 Supraventricular tachycardia: Secondary | ICD-10-CM | POA: Diagnosis not present

## 2018-05-16 DIAGNOSIS — J449 Chronic obstructive pulmonary disease, unspecified: Secondary | ICD-10-CM | POA: Diagnosis not present

## 2018-05-16 DIAGNOSIS — Z950 Presence of cardiac pacemaker: Secondary | ICD-10-CM | POA: Diagnosis not present

## 2018-05-16 DIAGNOSIS — I10 Essential (primary) hypertension: Secondary | ICD-10-CM | POA: Diagnosis not present

## 2018-05-16 DIAGNOSIS — G4733 Obstructive sleep apnea (adult) (pediatric): Secondary | ICD-10-CM | POA: Diagnosis not present

## 2018-05-16 DIAGNOSIS — Z5181 Encounter for therapeutic drug level monitoring: Secondary | ICD-10-CM

## 2018-05-16 DIAGNOSIS — I509 Heart failure, unspecified: Secondary | ICD-10-CM | POA: Diagnosis not present

## 2018-05-16 DIAGNOSIS — R0609 Other forms of dyspnea: Secondary | ICD-10-CM | POA: Diagnosis not present

## 2018-05-16 DIAGNOSIS — J45909 Unspecified asthma, uncomplicated: Secondary | ICD-10-CM | POA: Diagnosis not present

## 2018-05-16 DIAGNOSIS — E785 Hyperlipidemia, unspecified: Secondary | ICD-10-CM | POA: Diagnosis not present

## 2018-05-16 LAB — POCT INR: INR: 1.4 — AB (ref 2.0–3.0)

## 2018-05-16 NOTE — Patient Instructions (Signed)
Description   Today and tomorrow take 2 tablets then Continue taking 1 and 1/2 tablets every day except 2 tablets on Sundays.  Recheck in 7 days.  Continue eating your leafy green vegetables 3 servings each week.  Call [facility] with any new or different medications or procedures #  [phone] Pt using pill box now.

## 2018-05-17 ENCOUNTER — Other Ambulatory Visit: Payer: Self-pay | Admitting: Pulmonary Disease

## 2018-05-17 DIAGNOSIS — J4530 Mild persistent asthma, uncomplicated: Secondary | ICD-10-CM

## 2018-05-21 ENCOUNTER — Other Ambulatory Visit: Payer: Self-pay

## 2018-05-21 ENCOUNTER — Encounter: Payer: Self-pay | Admitting: *Deleted

## 2018-05-21 ENCOUNTER — Other Ambulatory Visit: Payer: Self-pay | Admitting: *Deleted

## 2018-05-21 NOTE — Patient Outreach (Signed)
[facility]) Care Management  05/21/2018  [name] 1940/11/03 [id number]  TELEPHONE SCREENING Referral date: 05/15/18 Referral source: primary Md Referral reason: Care coordination, follow up regarding plan of care Insurance: [facility]  Telephone call to patient regarding primary MD referral. HIPAA verified with patient. Explained reason for call. Discussed and offered [facility] care management services to patient. Patient verbally agreed. Patient reports having congestive heart failure, asthma, hypertension, atrial fibrillation, and COPD.  Patient reports she needs a AM, PM , and bed time pill box. She states the reason she is not taking her medications as prescribed is due to her not being able to afford them.   Patient denies receiving medication through [facility] mail order.   Patient expressed some misunderstanding related to her medication purposes.  Patient states she needs assistance with transportation to her doctor appointments.  Patient states she sustained a fall in July 2019 without severe injury. She reports she was on the floor for several hours before help arrived.  Patient states she was seen in the emergency room. Patient states she has a Medical alert service but has been unable to paying for it. She states she is concerned that she is unable to wear the equipment in the shower as well as the coverage radium being insufficient.  Patient reports she ambulates with cane.  Patient reports she is unable to afford glasses status post cataract surgery.  She states her vision is getting worse. Patient states she has had a recent death in the family. She reports her family member was killed within this past week from a hit and run.    ASSESSMENT:  Per chart review: Patient is not following a low-sodium diet.  Patient eats out and has fast food often.   PHQ2 - 3,    PHQ9- 15 per RNCM assessment  PLAN: RNCM will refer patient to community case manager, Field seismologist.   Quinn Plowman RN,BSN,CCM [facility] Telephonic  [phone]

## 2018-05-21 NOTE — Patient Outreach (Signed)
[name]  05/21/2018  [name] 02-21-1941 [id number]   CSW made an initial attempt to try and contact patient today to perform the initial phone assessment, as well as assess and assist with social work needs and services, without success.  A HIPAA compliant message was left for patient on voicemail.  CSW is currently awaiting a return call.  CSW will make a second outreach attempt within the next 3-4 business days, if a return call is not received from patient in the meantime.  CSW will also mail an Outreach Letter to patient's home requesting that patient contact CSW if patient is interested in receiving social work services through [place] with Scientist, clinical (histocompatibility and immunogenetics). Nat Christen, BSW, MSW, LCSW  Licensed Education officer, environmental [facility]  Mailing [place] [address], [place], [place] [address] Physical Address-[address], [place], [place] [address] Toll Free Main # [phone] Fax # [phone] Cell # [phone]  Office # [phone] [email or web address]

## 2018-05-22 ENCOUNTER — Encounter: Payer: Self-pay | Admitting: *Deleted

## 2018-05-22 ENCOUNTER — Other Ambulatory Visit: Payer: Self-pay | Admitting: *Deleted

## 2018-05-22 ENCOUNTER — Telehealth: Payer: Self-pay | Admitting: Pharmacist

## 2018-05-22 NOTE — Patient Outreach (Signed)
[place] [facility]) Care Management  05/22/2018  [name] [birth year] [id number]    RN spoke with pt and verified identifiers and the purpose for today's call. Pt explained some of her needs that will be address via [place] and social work. Pt also expressed the need for home scales to weight as HF was discussed and review of the HF zones with education today. Pt verbalized an understanding aware of the need to change her dietary intake. Pt mentioned she spoke with her provider on Tuesday and was given some information on dietary items to eat healthier food items and avoid high sodium intake. RN has inquired on a scale that pt has indicated she can use due to the one she has not working. Will request [employer] office to mail accordingly. Will also inquired on any swelling as pt denies any swelling. 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 RN offered [facility] home visit to focus on HF with printed information and further discussed all three HF zones and when to seek medical attention with acute symptoms. Pt very grateful for the call and information provided today. Will also educate on a low sodium diet to lower the risk of fluid retention related to HF. RN discussed a plan of care with related goals and interventions as pt agree "This is something she can follow".  RN offered a home visit that pt was receptive to and scheduled a home visit for next week based upon pt's preference.  Will completed the initial assessment and also provide a A.D packet and further discuss upon the home visit next week (pt request to bring instead of mail on this packet). Inquired on any other needs or services at this time with no additional needs. Will continue to inquire on the home visit and address accordingly. Will alert pt's provider of pt's disposition with [facility] services.  THN CM Care Plan Problem One     Most Recent Value  Care Plan Problem One  Knowledge Deficit in  managing her CHF   Role Documenting the Problem One  Care Management Coordinator  Care Plan for Problem One  Active  THN Long Term Goal   Pt will have an increase knwledge base on how to manage her CHF over the next 90 days  THN Long Term Goal Start Date  05/15/18  Interventions for Problem One Long Term Goal  Will discuss the HF zones and verify pt is in the GREEN zone and the importance of daily monitor to prevent acute problems from occur with early interventions.  THN CM Short Term Goal #1   Pt will adhere to recognizing s/s and weigh daily to aovid acute symptoms over the next 30 days  THN CM Short Term Goal #1 Start Date  05/22/18  Interventions for Short Term Goal #1  Pt will recognize early symptoms such as swelling and report abnormal weights to her provider to prevent acute encounters.  THN CM Short Term Goal #2   Follow aa low sodium diet related to CHF over the next 30 days  THN CM Short Term Goal #2 Start Date  05/22/18  Interventions for Short Term Goal #2  Will discuss the importance of low sodium dietary measures to prevent fuild retention related to her CHF. Will reiterate on healthy eating habits to avoid acute symptoms related.       [name], RN Care Management Coordinator [place] Office [phone]

## 2018-05-23 ENCOUNTER — Ambulatory Visit (INDEPENDENT_AMBULATORY_CARE_PROVIDER_SITE_OTHER): Payer: Medicare HMO | Admitting: *Deleted

## 2018-05-23 DIAGNOSIS — E669 Obesity, unspecified: Secondary | ICD-10-CM | POA: Diagnosis not present

## 2018-05-23 DIAGNOSIS — N189 Chronic kidney disease, unspecified: Secondary | ICD-10-CM | POA: Diagnosis not present

## 2018-05-23 DIAGNOSIS — I471 Supraventricular tachycardia, unspecified: Secondary | ICD-10-CM

## 2018-05-23 DIAGNOSIS — Z950 Presence of cardiac pacemaker: Secondary | ICD-10-CM | POA: Diagnosis not present

## 2018-05-23 DIAGNOSIS — E785 Hyperlipidemia, unspecified: Secondary | ICD-10-CM | POA: Diagnosis not present

## 2018-05-23 DIAGNOSIS — Z7901 Long term (current) use of anticoagulants: Secondary | ICD-10-CM | POA: Diagnosis not present

## 2018-05-23 DIAGNOSIS — Z5181 Encounter for therapeutic drug level monitoring: Secondary | ICD-10-CM

## 2018-05-23 DIAGNOSIS — I10 Essential (primary) hypertension: Secondary | ICD-10-CM | POA: Diagnosis not present

## 2018-05-23 LAB — POCT INR: INR: 2.3 (ref 2.0–3.0)

## 2018-05-23 NOTE — Patient Instructions (Signed)
Description   Continue taking 1 and 1/2 tablets every day except 2 tablets on Sundays. Recheck in 2 weeks. Continue eating your leafy green vegetables 3 servings each week.  Call Coumadin Clinic with any new or different medications or procedures #  [phone] Pt using pill box now.     

## 2018-05-23 NOTE — Patient Outreach (Addendum)
[place] [facility]) Care Management  05/23/2018  [name] 1941-03-09 [id number]  [place] [facility]) Care Management  [place]   05/23/2018  [name] Jun 10, 1941 [id number]   Reason for referral: medication assistance  Referral source: [facility] Telephonic Nursing  Referral medication(s): several  Current insurance:[employer]  HPI:  Allergic rhinitis, heart failure, hypertension, obesity, and atrial tachycardia  Objective: Allergies  Allergen Reactions  . Albuterol Other (See Comments)    irregular heartbeat    Medications Reviewed Today    Reviewed by Elayne Guerin, [facility] (Pharmacist) on 05/23/18 at [place] List Status: <None>  Medication Order Taking? Sig Documenting Provider Last Dose Status Informant  albuterol (PROVENTIL HFA;VENTOLIN HFA) 108 (90 Base) MCG/ACT inhaler [id number] Yes Inhale 2 puffs into the lungs every 6 (six) hours as needed for wheezing or shortness of breath.  Patient not taking:  Reported on 05/22/2018   Lauraine Rinne, NP Taking Active   bisoprolol (ZEBETA) 5 MG tablet [id number] Yes TAKE ONE TABLET BY MOUTH ONCE DAILY  Patient not taking:  Reported on 05/22/2018   Tanda Rockers, MD Taking Active   Bromfenac Sodium (PROLENSA) 0.07 % SOLN [id number] No Apply to eye as directed. [provider] Unknown Active   budesonide-formoterol (SYMBICORT) 80-4.5 MCG/ACT inhaler [id number] Yes Inhale 2 puffs into the lungs 2 (two) times daily. Lauraine Rinne, NP Taking Active   furosemide (LASIX) 40 MG tablet [id number] Yes Take 40 mg by mouth 2 (two) times daily. Patient takes 2 pills once a day around 1000 [provider] Taking Active Self  montelukast (SINGULAIR) 10 MG tablet [id number] Yes TAKE 1 TABLET BY MOUTH AT BEDTIME EVERY NIGHT Lauraine Rinne, NP Taking Active   omeprazole (PRILOSEC) 20 MG capsule [id number] Yes Take 1 capsule (20 mg total) by mouth daily. Take 30-60 min before first meal of the day Lauraine Rinne, NP Taking Active   Propylene Glycol-Glycerin (SOOTHE) 0.6-0.6 % SOLN [id number] Yes Apply to eye as directed. [provider] Taking Active   spironolactone (ALDACTONE) 25 MG tablet [id number] No TAKE ONE TABLET BY MOUTH ONCE DAILY  Patient not taking:  Reported on 05/14/2018   Tanda Rockers, MD Not Taking Active   valsartan (DIOVAN) 160 MG tablet [id number] Yes Take 160 mg by mouth daily. [provider] Taking Active   warfarin (COUMADIN) 3 MG tablet [id number] Yes TAKE 1 & 1/2-2 TABLETS BY MOUTH DAILY OR AS DIRECTED BY COUMADIN CLINIC Evans Lance, MD Taking Active           Assessment:  Drugs sorted by system:  Cardiovascular: Bisoprolol, furosemide, Spironolactone, Valsartan, Warfarin  Pulmonary/Allergy: Albuterol, montelukast, Symicort  Gastrointestinal: Omeprazole  Miscellaneous: Propylene Glycol-Glycerin (Soothe) Prolensa (unknown)  Medication Review Findings:  . Monitoring may be necessary (CMP)-Combination of valsartan/spironolactone may cause hyperkalemia  Medication Assistance Findings:  Extra Help:   [x]  Already receiving Full Extra Help  []  Already receiving Partial Extra Help  []  Eligible based on reported income and assets  []  Not Eligible based on reported income and assets    Patient Assistance Programs: None since patient has [employer] and had the patient set up with [employer].  She will be able to receive all of her medications for $0 copay for a 90 day supply with the exception of Ventolin HFA and Symbicort  Additional medication assistance options reviewed with patient as warranted:  Mail order programs, Insurance OTC  catalogue  Plan: Call patient back in 2 weeks to see if she received her medications.    Route note to PCP.   Elayne Guerin, PharmD, [place] Clinical Pharmacist [phone]

## 2018-05-24 ENCOUNTER — Other Ambulatory Visit: Payer: Self-pay

## 2018-05-24 MED ORDER — BUDESONIDE-FORMOTEROL FUMARATE 80-4.5 MCG/ACT IN AERO: 2.0000 | INHALATION_SPRAY | Freq: Two times a day (BID) | RESPIRATORY_TRACT | 1 refills | Status: DC

## 2018-05-24 MED ORDER — ALBUTEROL SULFATE HFA 108 (90 BASE) MCG/ACT IN AERS: 2.0000 | INHALATION_SPRAY | Freq: Four times a day (QID) | RESPIRATORY_TRACT | 1 refills | Status: DC | PRN

## 2018-05-27 ENCOUNTER — Encounter: Payer: Self-pay | Admitting: *Deleted

## 2018-05-27 ENCOUNTER — Other Ambulatory Visit: Payer: Self-pay | Admitting: *Deleted

## 2018-05-27 NOTE — Patient Outreach (Signed)
[place] [facility]) Care Management  05/27/2018  [name] Jul 23, 1941 [id number]   CSW was able to make initial contact with patient today to perform phone assessment, as well as assess and assist with social work needs and services.  CSW introduced self, explained role and types of services provided through [facility] ([facility]).  CSW further explained to patient that CSW works with patient's Telephonic RNCM, also with [facility], [name]. CSW then explained the reason for the call, indicating that [name] thought that patient would benefit from social work services and resources to assist with counseling and supportive services for grief and loss.  CSW obtained two HIPAA compliant identifiers from patient, which included patient's name and date of birth. Patient appeared to be in great spirits, "thanking God and singing His praise" when CSW inquired as to how patient was doing.  Patient admitted that she is happy to be alive and grateful for her health.  CSW explained to patient that she scored a 3 on her PHQ2 and a 15 on her PHQ9 Depression Screenings, causing concern that she may be experiencing symptoms of anxiety and depression.  Patient reported that a family member was recently killed by a "hit and run" driver, which caused her quite a bit of grief.  CSW allowed patient an opportunity to discuss the tragedy in full detail, offering counseling and supportive services, as well as Cognitive Behavioral Therapy, where appropriate. CSW offered to provide patient with short-term counseling services, or even refer patient to a therapist in the community; however, patient declined.  CSW also spoke with patient about Grief and Loss Counseling services, offered through [place], but again, patient declined.  Patient stated, "I'm doing much better now and I'm in a much better place".  CSW inquired as  to what techniques patient is using to help cope with her symptoms of anxiety, depression and grief.  Patient admitted that she is praying a lot and reading her Bible often.  Patient went on to say, "I try not to dwell on things that I have no control over and I have to remember to trust in God's plan". Patient reported that she has a supportive family and that she is very involved with her church and it's members.  Patient talked about a fall that she sustained back in July, admitting that she laid in the floor for several hours before receiving help.  Since then, patient has talked with her family about getting her Life Alert system reconnected, as they have offered to share the experience to take the burden off of patient.  In the meantime, patient has been encouraged to carry her cell phone with her at all times and use her walker to assist with ambulation.  Patient voiced understanding and was agreeable to this plan.  Patient indicated that she tries not to stand up too suddenly and waits to balance herself before taking the first step. Patient indicated that her niece, [name] is in the process of making arrangements to transport her to and from all her physician appointments, as patient is no longer able to drive.  CSW inquired as to whether or not patient wanted CSW to assist her with completion of a SCAT Paramedic) application through [employer] or make a referral for patient to [employer] through [employer] [facility] [place], but patient was not interested.  Patient reported that  one of her sons would transport her before they would allow her to take public transportation.  Patient agreed to take down CSW's contact information and call CSW directly if plans change. CSW inquired as to how CSW could be of assistance to patient at this time, but patient was unable to identify any social work specific needs at present.  Patient agreed to  contact CSW if anything changes or if assistance is needed in the near future.  CSW explained to patient that she will continue to receive pharmacy services through [name], as well as nursing services through [name], both with [facility].  Patient admitted that she has already spoken with both of them and that she has agreed to receive services. CSW will perform a case closure on patient, as all goals of treatment have been met from social work standpoint and no additional social work needs have been identified at this time.  CSW will notify patient's RNCM with [facility], [name] of CSW's plans to close patient's case. CSW will notify patient's Pharmacist, [name], also with [facility], of CSW's plans to close patient's case.  Last, CSW will fax an update to patient's Primary Care Physician, Dr. Dixie Dials to ensure that they are aware of CSW's involvement with patient's plan of care.   [name], BSW, MSW, LCSW  Licensed Education officer, [facility]  Mailing [place] [address] [place], [place] [address] Physical Address-[address], [place], [place] [address] Toll Free Main # [phone] Fax # [phone] Cell # [phone]  Office # [phone] [email or web address]

## 2018-05-28 ENCOUNTER — Other Ambulatory Visit: Payer: Self-pay | Admitting: *Deleted

## 2018-05-28 NOTE — Patient Outreach (Signed)
[place] [facility]) Care Management   05/28/2018  [name] 1940-12-24 [id number]  [name] is an 77 y.o. female  Subjective:  HF: Pt reports she has not received the ordered scales via [facility] yet but indicates she has not gained to much on the fluid.  Pt report when she does not elevate her LE they swelling but reports that she does not have swelling in the AM upon arising however later in the day. Pt states she tries to elevate at much as possible throughout the day. DIET: Pt report consuming a low sodium diet and will continue to do this to avoid ongoing swelling.  Objective:   Review of Systems  Constitutional: Negative.   HENT: Negative.   Eyes: Negative.   Respiratory: Negative.   Cardiovascular: Positive for leg swelling.       History with LE edema.  Gastrointestinal: Negative.   Genitourinary: Negative.   Musculoskeletal: Negative.   Skin: Negative.   Neurological: Negative.   Endo/Heme/Allergies: Negative.   Psychiatric/Behavioral: Negative.     Physical Exam  Constitutional: She is oriented to person, place, and time. She appears well-developed and well-nourished.  HENT:  Right Ear: External ear normal.  Left Ear: External ear normal.  Eyes: EOM are normal.  Neck: Normal range of motion.  Cardiovascular: Normal heart sounds.  Respiratory: Effort normal and breath sounds normal.  GI: Soft. Bowel sounds are normal.  Musculoskeletal: She exhibits edema.  Bilateral edema  Neurological: She is alert and oriented to person, place, and time.  Skin: Skin is warm and dry.  Psychiatric: She has a normal mood and affect. Her behavior is normal. Judgment and thought content normal.    Encounter Medications:   Outpatient Encounter Medications as of 05/28/2018  Medication Sig  . albuterol (PROVENTIL HFA;VENTOLIN HFA) 108 (90 Base) MCG/ACT inhaler Inhale 2 puffs into the lungs every 6 (six) hours as needed for wheezing or shortness of breath.  .  bisoprolol (ZEBETA) 5 MG tablet TAKE ONE TABLET BY MOUTH ONCE DAILY (Patient not taking: Reported on 05/22/2018)  . Bromfenac Sodium (PROLENSA) 0.07 % SOLN Apply to eye as directed.  . budesonide-formoterol (SYMBICORT) 80-4.5 MCG/ACT inhaler Inhale 2 puffs into the lungs 2 (two) times daily.  . furosemide (LASIX) 40 MG tablet Take 40 mg by mouth 2 (two) times daily. Patient takes 2 pills once a day around 1000  . montelukast (SINGULAIR) 10 MG tablet TAKE 1 TABLET BY MOUTH AT BEDTIME EVERY NIGHT  . omeprazole (PRILOSEC) 20 MG capsule Take 1 capsule (20 mg total) by mouth daily. Take 30-60 min before first meal of the day  . Propylene Glycol-Glycerin (SOOTHE) 0.6-0.6 % SOLN Apply to eye as directed.  Marland Kitchen spironolactone (ALDACTONE) 25 MG tablet TAKE ONE TABLET BY MOUTH ONCE DAILY (Patient not taking: Reported on 05/14/2018)  . valsartan (DIOVAN) 160 MG tablet Take 160 mg by mouth daily.  Marland Kitchen warfarin (COUMADIN) 3 MG tablet TAKE 1 & 1/2-2 TABLETS BY MOUTH DAILY OR AS DIRECTED BY COUMADIN CLINIC   No facility-administered encounter medications on file as of 05/28/2018.     Functional Status:   In your present state of health, do you have any difficulty performing the following activities: 05/27/2018 05/22/2018  Hearing? N N  Vision? N N  Difficulty concentrating or making decisions? Y N  Walking or climbing stairs? Y Y  Dressing or bathing? N N  Doing errands, shopping? Tempie Donning  Preparing Food and eating ? N N  Using the Toilet? N N  In the past six months, have you accidently leaked urine? Y Y  Do you have problems with loss of bowel control? N N  Managing your Medications? N N  Managing your Finances? N N  Housekeeping or managing your Housekeeping? Y Y  Some recent data might be hidden    Fall/Depression Screening:    Fall Risk  05/27/2018 05/22/2018  Falls in the past year? Yes Yes  Number falls in past yr: 1 1  Injury with Fall? No No  Risk for fall due to : Impaired balance/gait;Impaired  mobility Impaired balance/gait  Follow up Education provided;Falls prevention discussed Falls prevention discussed   PHQ 2/9 Scores 05/27/2018 05/21/2018  PHQ - 2 Score 1 3  PHQ- 9 Score 3 15  BP 122/78 (BP Location: Right Arm, Patient Position: Sitting, Cuff Size: Normal)   Pulse 76   Resp 20   Ht 1.702 m (5\' 7" )   Wt 272 lb (123.4 kg)   SpO2 97%   BMI 42.60 kg/m   Assessment:   Consent related to [facility] services Ongoing case management related to CHF Swelling related to LE  Plan:  Will obtain a signed consent for [facility] services. Will further discussed and review all printed material provided via [employer] for the following information: HF: Adult, HF:How to e salt smart and HF: When to call your doctor or 911. Will review HF zones and verify pt remains in the GREEN other then pt's history of bilateral edema.  Will strongly encouraged pt to elevate her LE to reduce ongoing swelling related to her HF and the risk involved if not monitored. Will encouraged pt to check between toes and feet daily for possible infection or sores.  Plan of care reviewed and discussed with interventions adjusted accordingly. Will send initial home visit to pt's primary provider.  THN CM Care Plan Problem One     Most Recent Value  Care Plan Problem One  Knowledge Deficit in managing her CHF   Role Documenting the Problem One  Care Management Coordinator  Care Plan for Problem One  Active  THN Long Term Goal   Pt will have an increase knwledge base on how to manage her CHF over the next 90 days  THN Long Term Goal Start Date  05/15/18  Interventions for Problem One Long Term Goal  Discussed HF for increase knowledge base and strongly emphasis the risk involved if not monitored on a daily bases.  THN CM Short Term Goal #1   Pt will adhere to recognizing s/s and weigh daily to aovid acute symptoms over the next 30 days  THN CM Short Term Goal #1 Start Date  05/22/18  Interventions for Short Term Goal #1  Will review  the HF zones and possible symptoms and what to do if acute symptoms should occur.  THN CM Short Term Goal #2   Follow a low sodium diet related to CHF over the next 30 days  THN CM Short Term Goal #2 Start Date  05/22/18  Interventions for Short Term Goal #2  Will continue to discuss the importance of consuming a low salt diet and the impact it has on her HF.Marland Kitchen      [name], RN Care Management Coordinator [place] Office [phone]

## 2018-05-31 ENCOUNTER — Telehealth: Payer: Self-pay | Admitting: Pulmonary Disease

## 2018-05-31 MED ORDER — LEVALBUTEROL TARTRATE 45 MCG/ACT IN AERO: 2.0000 | INHALATION_SPRAY | RESPIRATORY_TRACT | 1 refills | Status: DC | PRN

## 2018-05-31 NOTE — Telephone Encounter (Signed)
Called and spoke with Patient's Daughter, [name].  She stated that Symbicort is working great for her Mother, but she was wanting the Albuterol/Ventolin changed to another type of emergency inhaler, due to her Mother's complaining of side effects, heart racing.  Discussed Symbicort instructions and it is a daily inhaler and Albuterol/Ventolin is a rescue inhaler. She would like Albuterol removed from her med list and a new emergency inhaler prescription sent to [facility] mail order.  Will route to Wyn Quaker, NP to advise

## 2018-05-31 NOTE — Telephone Encounter (Signed)
Can attempt to place prescription for:   Xopenex HFA  >>>2 puffs as needed every 4 to 6 hours prn    This will probably require a prior auth. So advise patient and daughter it will take some time.   Wyn Quaker FNP

## 2018-05-31 NOTE — Telephone Encounter (Signed)
I have discontinued the albuterol inhaler off of pt's med list and have sent the Xopenex in for pt.  Attempted to call [name] to let her know this information in regards to her mother's Rx but unable to reach her.  Left message for [name] to call back.

## 2018-06-03 NOTE — Telephone Encounter (Signed)
Attempted to contact pt. I did not receive an answer. I have left a message for pt to return our call.  

## 2018-06-04 NOTE — Telephone Encounter (Signed)
Attempted to contact pt's daughter, Mardene Celeste. I did not receive an answer. I have left a message for Mardene Celeste to return our call.

## 2018-06-05 ENCOUNTER — Other Ambulatory Visit: Payer: Self-pay | Admitting: Pharmacist

## 2018-06-05 NOTE — Patient Outreach (Signed)
[name]) Care Management  06/05/2018  [name] 19-May-1941 [id number]   Patient was called regarding medication assistance and adherence. HIPAA identifiers were obtained. Patient confirmed she received her medications from [facility] and the [facility].  The process to place an OTC order was reviewed with the patient as were her medications.   Patient's chart was reviewed. She was recently started on Xopenex HFA inhaler on 05/31/18. Patient confirmed she received Xopenex from [facility].   Patient is now set to receive her medications from [place].  Plan: Close patient's pharmacy case. Alert [facility] Care Manager, [name], RN.  Elayne Guerin, PharmD, [place] Clinical Pharmacist [phone]

## 2018-06-05 NOTE — Telephone Encounter (Signed)
We have attempted to contact pt's daughter, Mardene Celeste several times with no success or call back from her. Per triage protocol, message will be closed.

## 2018-06-06 ENCOUNTER — Ambulatory Visit (INDEPENDENT_AMBULATORY_CARE_PROVIDER_SITE_OTHER): Payer: Medicare HMO | Admitting: *Deleted

## 2018-06-06 DIAGNOSIS — I471 Supraventricular tachycardia: Secondary | ICD-10-CM

## 2018-06-06 DIAGNOSIS — Z950 Presence of cardiac pacemaker: Secondary | ICD-10-CM | POA: Diagnosis not present

## 2018-06-06 DIAGNOSIS — Z5181 Encounter for therapeutic drug level monitoring: Secondary | ICD-10-CM

## 2018-06-06 LAB — POCT INR: INR: 1.3 — AB (ref 2.0–3.0)

## 2018-06-06 NOTE — Patient Instructions (Signed)
Description   Today and tomorrow take 2 tablets then continue taking 1 and 1/2 tablets every day except 2 tablets on Sundays. Recheck in 1 week. Continue eating your leafy green vegetables 3 servings each week.  Call Coumadin Clinic with any new or different medications or procedures #  [phone] Pt using pill box now.     

## 2018-06-11 ENCOUNTER — Ambulatory Visit (INDEPENDENT_AMBULATORY_CARE_PROVIDER_SITE_OTHER): Payer: Medicare HMO | Admitting: Pulmonary Disease

## 2018-06-11 ENCOUNTER — Encounter: Payer: Self-pay | Admitting: Pulmonary Disease

## 2018-06-11 ENCOUNTER — Ambulatory Visit (INDEPENDENT_AMBULATORY_CARE_PROVIDER_SITE_OTHER): Payer: Medicare HMO | Admitting: Internal Medicine

## 2018-06-11 ENCOUNTER — Telehealth: Payer: Self-pay | Admitting: *Deleted

## 2018-06-11 ENCOUNTER — Encounter: Payer: Self-pay | Admitting: Internal Medicine

## 2018-06-11 ENCOUNTER — Ambulatory Visit (INDEPENDENT_AMBULATORY_CARE_PROVIDER_SITE_OTHER): Payer: Medicare HMO | Admitting: *Deleted

## 2018-06-11 DIAGNOSIS — J4530 Mild persistent asthma, uncomplicated: Secondary | ICD-10-CM

## 2018-06-11 DIAGNOSIS — J309 Allergic rhinitis, unspecified: Secondary | ICD-10-CM

## 2018-06-11 DIAGNOSIS — K219 Gastro-esophageal reflux disease without esophagitis: Secondary | ICD-10-CM

## 2018-06-11 DIAGNOSIS — I471 Supraventricular tachycardia: Secondary | ICD-10-CM | POA: Diagnosis not present

## 2018-06-11 DIAGNOSIS — I5032 Chronic diastolic (congestive) heart failure: Secondary | ICD-10-CM | POA: Diagnosis not present

## 2018-06-11 DIAGNOSIS — Z5181 Encounter for therapeutic drug level monitoring: Secondary | ICD-10-CM | POA: Diagnosis not present

## 2018-06-11 DIAGNOSIS — I442 Atrioventricular block, complete: Secondary | ICD-10-CM

## 2018-06-11 DIAGNOSIS — Z950 Presence of cardiac pacemaker: Secondary | ICD-10-CM

## 2018-06-11 LAB — POCT INR: INR: 2.9 (ref 2.0–3.0)

## 2018-06-11 NOTE — Assessment & Plan Note (Signed)
Continue Symbicort 80 >>> 2 puffs in the morning right when you wake up, rinse out your mouth after use, 12 hours later 2 puffs, rinse after use >>> Take this daily, no matter what >>> This is not a rescue inhaler   Only use your Xopenex as a rescue medication to be used if you can't catch your breath by resting or doing a relaxed purse lip breathing pattern.  - The less you use it, the better it will work when you need it. - Ok to use up to 2 puffs  every 4 hours if you must but call for immediate appointment if use goes up over your usual need - Don't leave home without it !!  (think of it like the spare tire for your car)   Follow-up in 2 to 3 months with Dr. Melvyn Novas

## 2018-06-11 NOTE — Assessment & Plan Note (Signed)
Continue omeprazole Avoid known triggers such as tomato

## 2018-06-11 NOTE — Progress Notes (Signed)
@Patient  ID: Levert Feinstein, female    DOB: September 19, 1940, 77 y.o.   MRN: [id number]  Chief Complaint  Patient presents with  . Follow-up    Asthma     Referring provider: Dixie Dials, MD  HPI:  77 year old female former smoker followed in our office for asthma.  PMH: Morbid obesity, hypertension, obesity Smoker/ Smoking History: Former smoker.  4-pack-year history. Maintenance: Symbicort 80 pt of: Dr. Melvyn Novas   Recent [facility] Encounters:   05/14/2018  - Visit - BM 77 year old female patient presenting today for acute visit.  Patient reports she is had increased lower extremity swelling over the past 6 months.  Patient reports nonadherence to her fluid pill as well as her blood pressure medications.  Patient is also not been taking her Dulera inhaler for at least the last 6 months.  Patient reports that she has not used her rescue inhaler months.  Patient does not know where her rescue inhaler is.  Patient does report that she is had chronic orthopnea.  Patient is not following a low-sodium diet.  Patient eats out and has fast food often.  Patient reports that she does not add salt to her meals.Patient is accompanied today by her goddaughter who is now helping patient manage her health.  Patient is aware that she is not been adherent to her plan of care has scheduled follow-ups with her providers to get back on track.  Patient reports she will see Dr. Terrence Dupont tomorrow on 05/15/2018 for management of her blood pressure as well as her lower extremity swelling and fluid pill. Plan: High-dose flu vaccine today, start Symbicort 80 this replaces your Dulera 100, continue Singulair 10, continue omeprazole 20 mg, continue to follow-up with cardiology  06/11/2018  - Visit   77 year old female patient presenting today for follow-up visit.  Patient reporting she is doing much better since last office visit.  Patient has completed follow-up with cardiology.  Patient reports that they are also now  receiving home health services for heart failure through [facility].  Patient reports that she has not started weighing herself daily but she feels her weight is down.  Weight is down 14 pounds today from last office visit.  Patient reports adherence to Lasix.  Patient has also started to work diligently on low-sodium diet.  She continues to be adherence to Symbicort 80.  Patient reports she does have an occasional cough sometimes with white to yellow productive mucus.  Patient denies any fevers.  Patient reports occasional wheezes with exertion.    Tests:  04/29/2015-FVC 2.0 (72% predicted) ratio 73, FEV1 71% >>>mild restriction  01/05/2015-echocardiogram-LV ejection fraction 50 to 95%, grade 1 diastolic dysfunction   FENO:  No results found for: NITRICOXIDE  PFT: No flowsheet data found.  Imaging: No results found.  Chart Review:    Specialty Problems      Pulmonary Problems   Allergic rhinitis    Stopped allergy vaccine 208       Mild persistent asthma    Followed in Pulmonary clinic/ [facility]/ [place] - spirometry 02/02/15 > restrictive only  - added back singulair 02/03/2015 >>>  - spirometry 04/29/2015 with low mid flows only  - 11/15/2015   resume dulera 100  - Allergy profile  11/15/15 >  Eos 0.2 /  IgE  651  Dust/roach > grass   -med calendar 12/10/2015 > did not recognize copy 03/13/2016 , 03/28/16  - 03/13/2016    re-try dulera 100 2bid - 07/03/2016  After  extensive coaching HFA effectiveness =    90%          Dyspnea    Spirometry 02/02/15 restrictive only  - 04/29/2015  Walked RA x 3 laps @ 185 ft each stopped due to  End of study, nl pace, no sob or desat           Allergies  Allergen Reactions  . Albuterol Other (See Comments)    irregular heartbeat    Immunization History  Administered Date(s) Administered  . Influenza Split 05/01/2012  . Influenza Whole 05/13/2008, 07/28/2010  . Influenza, High Dose Seasonal PF 05/14/2018    Past Medical History:    Diagnosis Date  . Allergic rhinitis   . Anemia    hx due to med  . Arthritis   . Asthma   . CHF (congestive heart failure) ([place])   . Emphysema of lung ([place])   . Hyperlipidemia   . Hypertension   . Pacemaker   . PAT (paroxysmal atrial tachycardia) ([place]) 2008  . Pneumonia    hx  . 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 Packs/day: 1.00  . Years: 4.00  . Pack years: 4.00  . Types: Cigarettes  . Last attempt to quit: 08/07/1977  . Years since quitting: 40.8  Smokeless Tobacco Never Used   Counseling given: Yes  Continues to not smoke  Outpatient Encounter Medications as of 06/11/2018  Medication Sig  . bisoprolol (ZEBETA) 5 MG tablet TAKE ONE TABLET BY MOUTH ONCE DAILY  . Bromfenac Sodium (PROLENSA) 0.07 % SOLN Apply to eye as directed.  . budesonide-formoterol (SYMBICORT) 80-4.5 MCG/ACT inhaler Inhale 2 puffs into the lungs 2 (two) times daily.  . furosemide (LASIX) 40 MG tablet Take 40 mg by mouth 2 (two) times daily. Patient takes 2 pills once a day around 1000  . levalbuterol (XOPENEX HFA) 45 MCG/ACT inhaler Inhale 2 puffs into the lungs every 4 (four) hours as needed for wheezing.  . montelukast (SINGULAIR) 10 MG tablet TAKE 1 TABLET BY MOUTH AT BEDTIME EVERY NIGHT  . omeprazole (PRILOSEC) 20 MG capsule Take 1 capsule (20 mg total) by mouth daily. Take 30-60 min before first meal of the day  . Propylene Glycol-Glycerin (SOOTHE) 0.6-0.6 % SOLN Apply to eye as directed.  Marland Kitchen spironolactone (ALDACTONE) 25 MG tablet TAKE ONE TABLET BY MOUTH ONCE DAILY  . valsartan (DIOVAN) 160 MG tablet Take 160 mg by mouth daily.  Marland Kitchen warfarin (COUMADIN) 3 MG tablet TAKE 1 & 1/2-2 TABLETS BY MOUTH DAILY OR AS DIRECTED BY COUMADIN CLINIC   No facility-administered encounter medications on file as of 06/11/2018.      Review of Systems  Review of Systems  Constitutional: Positive for  fatigue. Negative for chills, fever and unexpected weight change.  HENT: Negative for congestion, ear pain, postnasal drip, sinus pressure and sinus pain.   Respiratory: Positive for cough. Negative for chest tightness, shortness of breath and wheezing.   Cardiovascular: Positive for leg swelling. Negative for chest pain and palpitations.  Gastrointestinal: Negative for blood in stool, diarrhea, nausea and vomiting.  Genitourinary: Negative for dysuria, frequency and urgency.  Musculoskeletal: Negative for arthralgias.  Skin: Negative for color change.  Allergic/Immunologic: Negative for environmental allergies and food allergies.  Neurological: Negative for dizziness, light-headedness and headaches.  Psychiatric/Behavioral: Negative for dysphoric mood. The patient is not nervous/anxious.  All other systems reviewed and are negative.    Physical Exam  BP (!) 96/58 (BP Location: Left Arm, Cuff Size: Normal)   Pulse (!) 113   Ht 5' 6.25" (1.683 m)   Wt 258 lb 6.4 oz (117.2 kg)   SpO2 97%   BMI 41.39 kg/m   Wt Readings from Last 5 Encounters:  06/11/18 258 lb 6.4 oz (117.2 kg)  05/28/18 272 lb (123.4 kg)  05/14/18 272 lb 12.8 oz (123.7 kg)  12/26/17 283 lb 6.4 oz (128.5 kg)  10/24/16 293 lb 3.2 oz (133 kg)     Physical Exam  Constitutional: She is oriented to person, place, and time and well-developed, well-nourished, and in no distress. No distress.  HENT:  Head: Normocephalic and atraumatic.  Right Ear: Hearing, tympanic membrane, external ear and ear canal normal.  Left Ear: Hearing, tympanic membrane, external ear and ear canal normal.  Nose: Nose normal.  Mouth/Throat: Uvula is midline and oropharynx is clear and moist. No oropharyngeal exudate.  Eyes: Pupils are equal, round, and reactive to light.  Neck: Normal range of motion. Neck supple. No JVD present.  Cardiovascular: Normal rate, regular rhythm and normal heart sounds.  Pulmonary/Chest: Effort normal and breath  sounds normal. No accessory muscle usage. No respiratory distress. She has no decreased breath sounds. She has no wheezes. She has no rhonchi. She has no rales. She exhibits no tenderness.  Abdominal: Soft. Bowel sounds are normal. There is no tenderness.  Musculoskeletal: Normal range of motion. She exhibits edema (2+ lower extremity swelling bilaterally).  Lymphadenopathy:    She has no cervical adenopathy.  Neurological: She is alert and oriented to person, place, and time. Gait normal.  Skin: Skin is warm and dry. She is not diaphoretic. No erythema.  Psychiatric: Mood, memory, affect and judgment normal.  Nursing note and vitals reviewed.     Lab Results:  CBC    Component Value Date/Time   WBC 12.0 (H) 02/16/2017 1044   RBC 3.84 (L) 02/16/2017 1044   HGB 11.3 (L) 02/16/2017 1044   HCT 35.6 (L) 02/16/2017 1044   PLT 237 02/16/2017 1044   MCV 92.7 02/16/2017 1044   MCH 29.4 02/16/2017 1044   MCHC 31.7 02/16/2017 1044   RDW 14.1 02/16/2017 1044   LYMPHSABS 1.5 02/16/2017 1044   MONOABS 0.7 02/16/2017 1044   EOSABS 0.0 02/16/2017 1044   BASOSABS 0.0 02/16/2017 1044    BMET    Component Value Date/Time   NA 139 02/16/2017 1044   K 4.3 02/16/2017 1044   CL 111 02/16/2017 1044   CO2 19 (L) 02/16/2017 1044   GLUCOSE 137 (H) 02/16/2017 1044   BUN 15 02/16/2017 1044   CREATININE 1.14 (H) 02/16/2017 1044   CREATININE 1.08 05/05/2011 1536   CALCIUM 8.5 (L) 02/16/2017 1044   GFRNONAA 45 (L) 02/16/2017 1044   GFRNONAA 50 (L) 05/05/2011 1536   GFRAA 52 (L) 02/16/2017 1044   GFRAA 60 (L) 05/05/2011 1536    BNP    Component Value Date/Time   BNP 45.7 01/04/2015 1620    ProBNP    Component Value Date/Time   PROBNP 23.0 11/15/2015 1520      Assessment & Plan:   Pleasant 77 year old patient complaining follow-up with our office today.  Patient doing clinically much better.  Significant improvement from fluid retention especially lower extremity swelling.  Patient  to continue follow-up with cardiology as well as continue to work to better control her heart failure.   We  will have patient follow-up with our office in 2 to 3 months or sooner if symptoms are not improving.  We will continue patient on Symbicort 80.  Emphasized the importance the patient for her to continue to work diligently on her diet to follow low-sodium as well as to avoid known triggers of GERD to which can exacerbate her breathing.  Mild persistent asthma Continue Symbicort 80 >>> 2 puffs in the morning right when you wake up, rinse out your mouth after use, 12 hours later 2 puffs, rinse after use >>> Take this daily, no matter what >>> This is not a rescue inhaler   Only use your Xopenex as a rescue medication to be used if you can't catch your breath by resting or doing a relaxed purse lip breathing pattern.  - The less you use it, the better it will work when you need it. - Ok to use up to 2 puffs  every 4 hours if you must but call for immediate appointment if use goes up over your usual need - Don't leave home without it !!  (think of it like the spare tire for your car)   Follow-up in 2 to 3 months with Dr. Melvyn Novas     Chronic diastolic heart failure [employer])  Continue follow-up with cardiology  Continue Lasix as prescribed  Continue to follow low-sodium diet  Weigh yourself daily in the morning record your weights  Keep follow-up with Coumadin clinic   Encounter for therapeutic drug monitoring Continue follow-up with Coumadin clinic  Allergic rhinitis Continue Singulair  GERD (gastroesophageal reflux disease) Continue omeprazole Avoid known triggers such as tomato     Lauraine Rinne, NP 06/11/2018

## 2018-06-11 NOTE — Assessment & Plan Note (Signed)
Continue follow-up with Coumadin clinic

## 2018-06-11 NOTE — Assessment & Plan Note (Signed)
  Continue follow-up with cardiology  Continue Lasix as prescribed  Continue to follow low-sodium diet  Weigh yourself daily in the morning record your weights  Keep follow-up with Coumadin clinic

## 2018-06-11 NOTE — Patient Instructions (Addendum)
Continue Symbicort 80 >>> 2 puffs in the morning right when you wake up, rinse out your mouth after use, 12 hours later 2 puffs, rinse after use >>> Take this daily, no matter what >>> This is not a rescue inhaler   Only use your Xopenex as a rescue medication to be used if you can't catch your breath by resting or doing a relaxed purse lip breathing pattern.  - The less you use it, the better it will work when you need it. - Ok to use up to 2 puffs  every 4 hours if you must but call for immediate appointment if use goes up over your usual need - Don't leave home without it !!  (think of it like the spare tire for your car)    Follow-up in 2 to 3 months with Dr. Melvyn Novas  Continue follow-up with cardiology  Continue Lasix as prescribed  Continue to follow low-sodium diet  Continue omeprazole   Weigh yourself daily in the morning record your weights  Keep follow-up with Coumadin clinic     November/2019 we will be moving! We will no longer be at our [place] location.  Be on the look out for a post card/mailer to let you know we have officially moved.  Our new address and phone number will be:  [address] [place], [place] [address] Telephone number: [phone]  It is flu season:   >>>Remember to be washing your hands regularly, using hand sanitizer, be careful to use around herself with has contact with people who are sick will increase her chances of getting sick yourself. >>> Best ways to protect herself from the flu: Receive the yearly flu vaccine, practice good hand hygiene washing with soap and also using hand sanitizer when available, eat a nutritious meals, get adequate rest, hydrate appropriately   Please contact the office if your symptoms worsen or you have concerns that you are not improving.   Thank you for choosing [facility] for your healthcare, and for allowing [place] to partner with you on your healthcare journey. I am thankful to be able to  provide care to you today.   Wyn Quaker FNP-C

## 2018-06-11 NOTE — Progress Notes (Addendum)
HPI [name] returns today for followup. She is a pleasant obese 77 yo woman with HTN, CHB, s/p PPM insertion. She has lost weight in the interim. She remains fairly active. She has not had syncope. She has peripheral edema and this has improved with lasix. She admits to some dietary indiscretion.  Allergies  Allergen Reactions  . Albuterol Other (See Comments)    irregular heartbeat     Current Outpatient Medications  Medication Sig Dispense Refill  . Bromfenac Sodium (PROLENSA) 0.07 % SOLN Apply to eye as directed.    . budesonide-formoterol (SYMBICORT) 80-4.5 MCG/ACT inhaler Inhale 2 puffs into the lungs 2 (two) times daily. 3 Inhaler 1  . furosemide (LASIX) 40 MG tablet Take 40 mg by mouth 2 (two) times daily. Patient takes 2 pills once a day around 1000    . levalbuterol (XOPENEX HFA) 45 MCG/ACT inhaler Inhale 2 puffs into the lungs every 4 (four) hours as needed for wheezing. 3 Inhaler 1  . montelukast (SINGULAIR) 10 MG tablet TAKE 1 TABLET BY MOUTH AT BEDTIME EVERY NIGHT 30 tablet 2  . omeprazole (PRILOSEC) 20 MG capsule Take 1 capsule (20 mg total) by mouth daily. Take 30-60 min before first meal of the day 30 capsule 5  . Propylene Glycol-Glycerin (SOOTHE) 0.6-0.6 % SOLN Apply to eye as directed.    Marland Kitchen spironolactone (ALDACTONE) 25 MG tablet TAKE ONE TABLET BY MOUTH ONCE DAILY 30 tablet 1  . valsartan (DIOVAN) 160 MG tablet Take 160 mg by mouth daily.    Marland Kitchen warfarin (COUMADIN) 3 MG tablet TAKE 1 & 1/2-2 TABLETS BY MOUTH DAILY OR AS DIRECTED BY COUMADIN CLINIC 150 tablet 0   No current facility-administered medications for this visit.      Past Medical History:  Diagnosis Date  . Allergic rhinitis   . Anemia    hx due to med  . Arthritis   . Asthma   . CHF (congestive heart failure) ([place])   . Emphysema of lung ([place])   . Hyperlipidemia   . Hypertension   . Pacemaker   . PAT (paroxysmal atrial tachycardia) ([place]) 2008  . Pneumonia    hx  . 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   Past Surgical History:  Procedure Laterality Date  . ABDOMINAL HYSTERECTOMY    . BREAST BIOPSY  04/30/2012   Procedure: BREAST BIOPSY WITH NEEDLE LOCALIZATION;  Surgeon: Rolm Bookbinder, MD;  Location: [facility];  Service: General;  Laterality: Left;  Left breast wire localization biospy  . HEMORRHOID SURGERY    . PACEMAKER INSERTION  02-06-2011  . TAH and BSO    . TONSILLECTOMY       Family History  Problem Relation Age of Onset  . Heart disease Mother   . Cancer Sister        breast     Social History   Socioeconomic History  . Marital status: Widowed    Spouse name: Not on file  . Number of children: 2  . Years of education: Not on file  . Highest education level: Not on file  Occupational History  . Occupation: cna  Social Needs  . Financial resource strain: Not on file  . Food insecurity:    Worry: Not on file    Inability: Not on file  . Transportation needs:  Medical: Not on file    Non-medical: Not on file  Tobacco Use  . Smoking status: Former Smoker    Packs/day: 1.00    Years: 4.00    Pack years: 4.00    Types: Cigarettes    Last attempt to quit: 08/07/1977    Years since quitting: 40.8  . Smokeless tobacco: Never Used  Substance and Sexual Activity  . Alcohol use: No    Alcohol/week: 0.0 standard drinks    Comment: quit 1979  . Drug use: No  . Sexual activity: Not on file  Lifestyle  . Physical activity:    Days per week: Not on file    Minutes per session: Not on file  . Stress: Not on file  Relationships  . Social connections:    Talks on phone: Not on file    Gets together: Not on file    Attends religious service: Not on file    Active member of club or organization: Not on file    Attends meetings of clubs or organizations: Not on file    Relationship status: Not on file  . Intimate partner violence:    Fear of current  or ex partner: Not on file    Emotionally abused: Not on file    Physically abused: Not on file    Forced sexual activity: Not on file  Other Topics Concern  . Not on file  Social History Narrative   Pt has 11 siblings in all     BP 120/70   Pulse 91   Ht 5' 6.25" (1.683 m)   Wt 258 lb (117 kg)   BMI 41.33 kg/m   Physical Exam:  Well appearing NAD HEENT: Unremarkable Neck:  6 cm JVD, no thyromegally Lymphatics:  No adenopathy Back:  No CVA tenderness Lungs:  Clear with no wheezes HEART:  Regular rate rhythm, no murmurs, no rubs, no clicks Abd:  soft, positive bowel sounds, no organomegally, no rebound, no guarding Ext:  2 plus pulses, no edema, no cyanosis, no clubbing Skin:  No rashes no nodules Neuro:  CN II through XII intact, motor grossly intact  ECG - nsr with AV sequential pacing  DEVICE  Normal device function.  See PaceArt for details.   Assess/Plan: 1. Complete heart block - she is asymptomatic, s/p PPM insertion. 2. PPM - her [employer] DDD PM is working normally. She is pacing 99% in the ventricle. 3. Obesity - we discussed the importance of weight loss. She will try to lose 20 lbs in the interim.  4. HTN - her blood pressure is well controlled. We will follow. She will try and reduce her sodium intake. No change in meds for now.  Mikle Bosworth.D.

## 2018-06-11 NOTE — Patient Instructions (Signed)
Description   Continue taking 1 and 1/2 tablets every day except 2 tablets on Sundays. Recheck in 2 weeks. Continue eating your leafy green vegetables 3 servings each week.  Call Coumadin Clinic with any new or different medications or procedures #  [phone] Pt using pill box now.     

## 2018-06-11 NOTE — Patient Instructions (Signed)
Medication Instructions:  No change  Labwork: none  Testing/Procedures: none  Follow-Up: Your physician wants you to follow-up in: device clinic in 6 months. You will receive a reminder letter in the mail two months in advance. If you don't receive a letter, please call our office to schedule the follow-up appointment. Your physician wants you to follow-up in: 12 months with Dr. Lovena Le.  You will receive a reminder letter in the mail two months in advance. If you don't receive a letter, please call our office to schedule the follow-up appointment.   Any Other Special Instructions Will Be Listed Below (If Applicable).     If you need a refill on your cardiac medications before your next appointment, please call your pharmacy.

## 2018-06-11 NOTE — Assessment & Plan Note (Signed)
Continue Singulair.

## 2018-06-12 LAB — CUP PACEART INCLINIC DEVICE CHECK
Battery Remaining Longevity: 31 mo
Battery Voltage: 2.81 V
Brady Statistic RA Percent Paced: 10 %
Date Time Interrogation Session: 20191105215810
Implantable Lead Implant Date: 20120702
Implantable Lead Location: 753860
Lead Channel Impedance Value: 362.5 Ohm
Lead Channel Pacing Threshold Amplitude: 0.75 V
Lead Channel Pacing Threshold Amplitude: 1.25 V
Lead Channel Pacing Threshold Pulse Width: 0.4 ms
Lead Channel Pacing Threshold Pulse Width: 0.8 ms
Lead Channel Sensing Intrinsic Amplitude: 3.8 mV
Lead Channel Setting Pacing Amplitude: 2 V
Lead Channel Setting Pacing Pulse Width: 0.5 ms
MDC IDC LEAD IMPLANT DT: 20120702
MDC IDC LEAD LOCATION: 753859
MDC IDC MSMT LEADCHNL RA PACING THRESHOLD AMPLITUDE: 0.75 V
MDC IDC MSMT LEADCHNL RA PACING THRESHOLD PULSEWIDTH: 0.4 ms
MDC IDC MSMT LEADCHNL RV IMPEDANCE VALUE: 437.5 Ohm
MDC IDC MSMT LEADCHNL RV PACING THRESHOLD AMPLITUDE: 1.25 V
MDC IDC MSMT LEADCHNL RV PACING THRESHOLD PULSEWIDTH: 0.8 ms
MDC IDC MSMT LEADCHNL RV SENSING INTR AMPL: 12 mV
MDC IDC PG IMPLANT DT: 20120702
MDC IDC SET LEADCHNL RV PACING AMPLITUDE: 2.5 V
MDC IDC SET LEADCHNL RV SENSING SENSITIVITY: 4 mV
MDC IDC STAT BRADY RV PERCENT PACED: 99.95 %
Pulse Gen Model: 2210
Pulse Gen Serial Number: 7254877

## 2018-06-13 IMAGING — CR DG HIP (WITH OR WITHOUT PELVIS) 2-3V*R*
3 series · 3 of 3 positions shown · non-contrast
Comparison: None.

CLINICAL DATA: Right hip pain after fall

EXAM:
DG HIP (WITH OR WITHOUT PELVIS) 2-3V RIGHT

[pelvis ap]
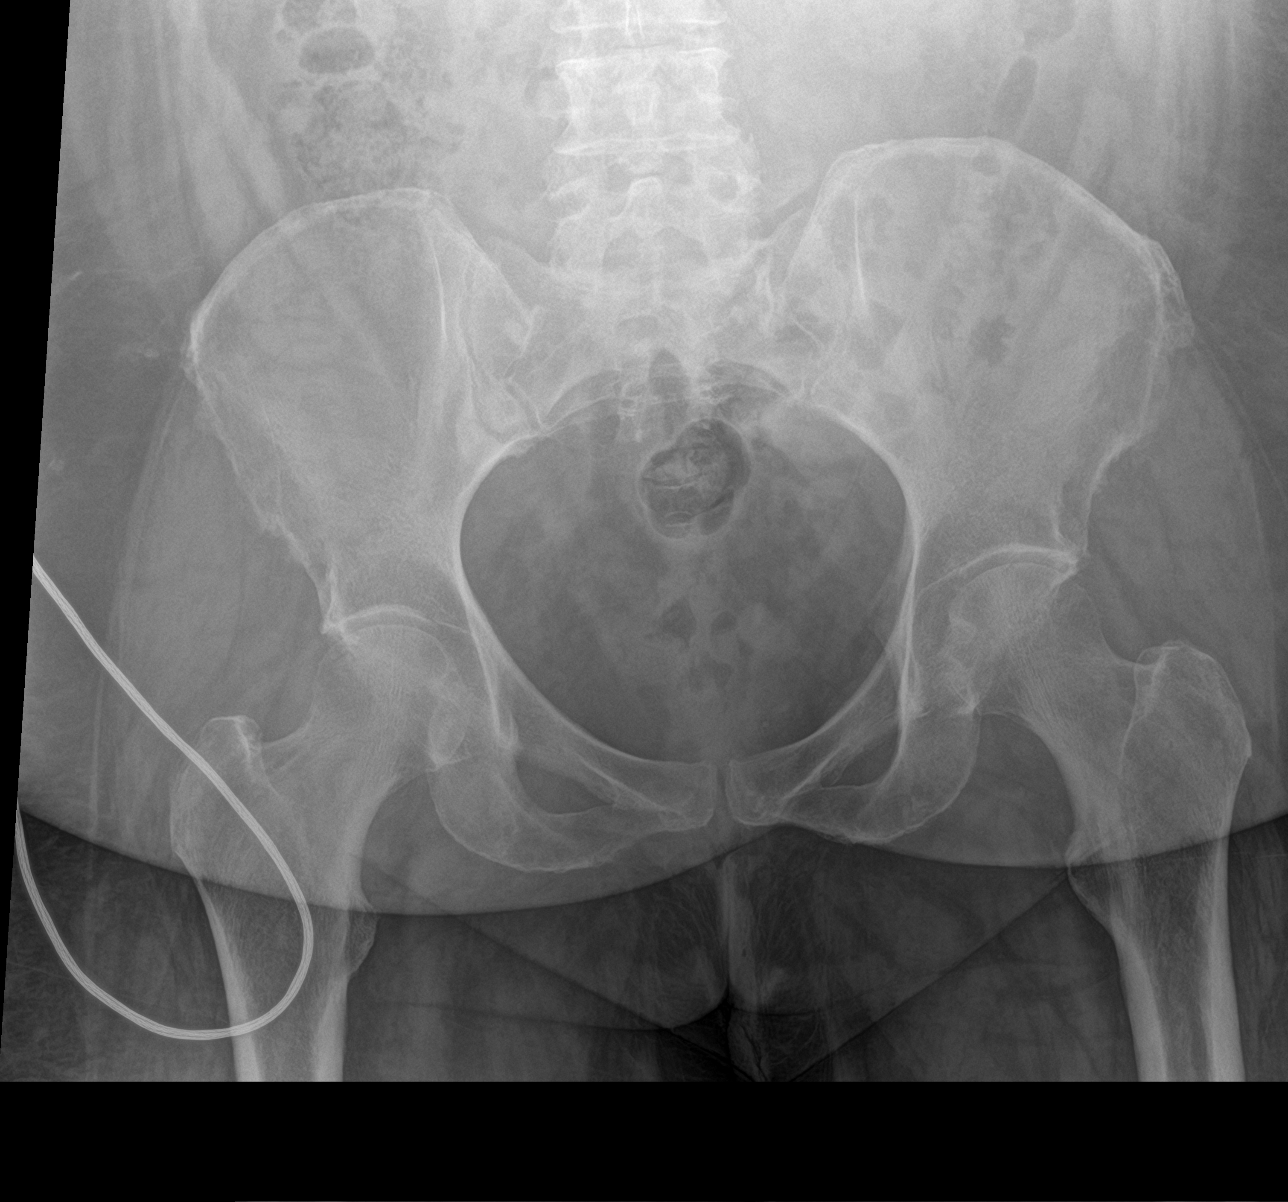

[hip ap]
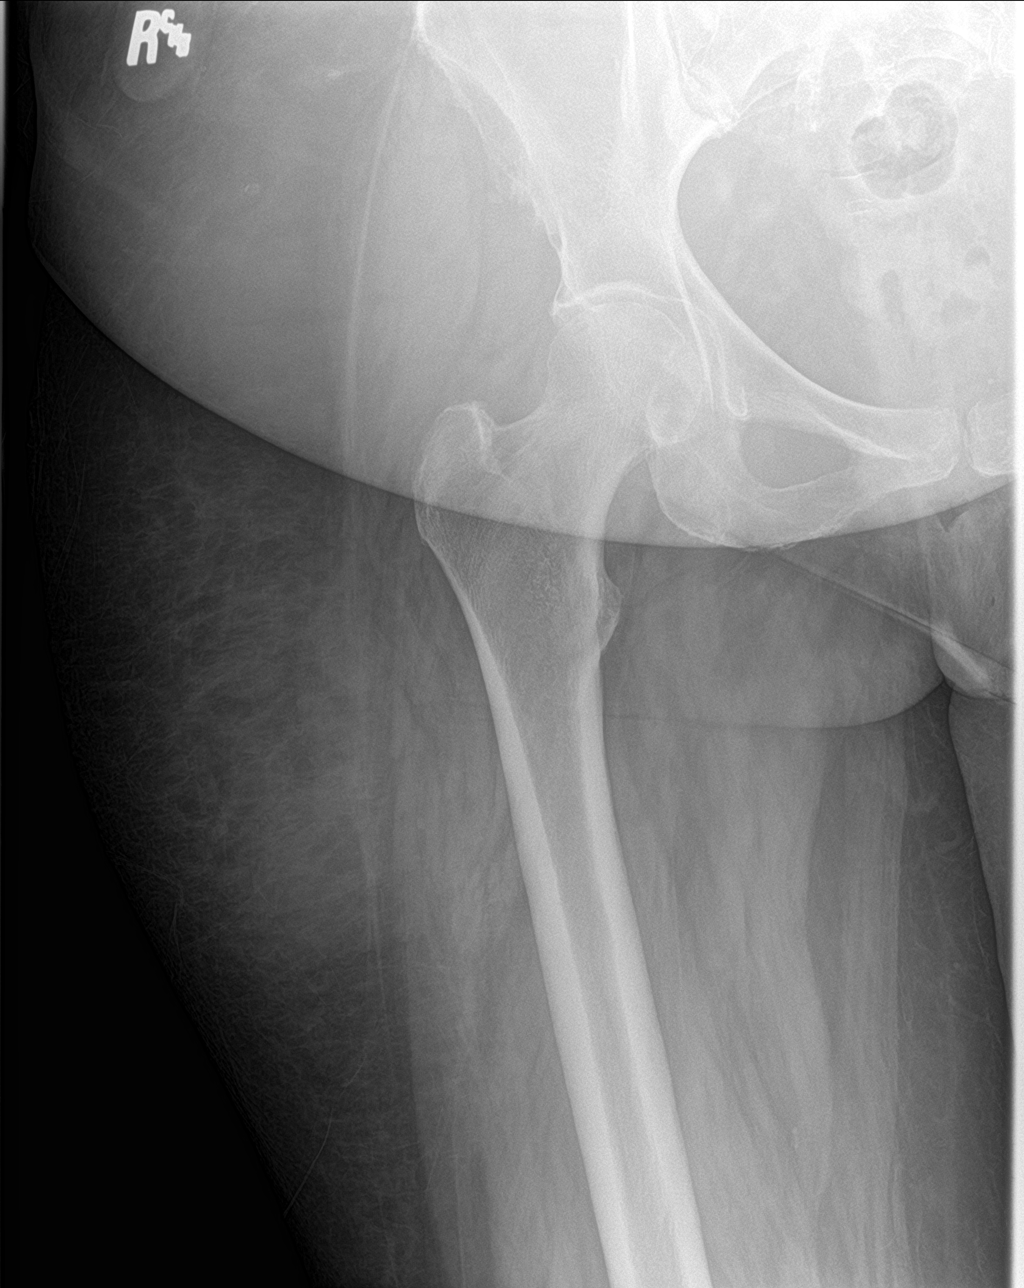

[hip lat]
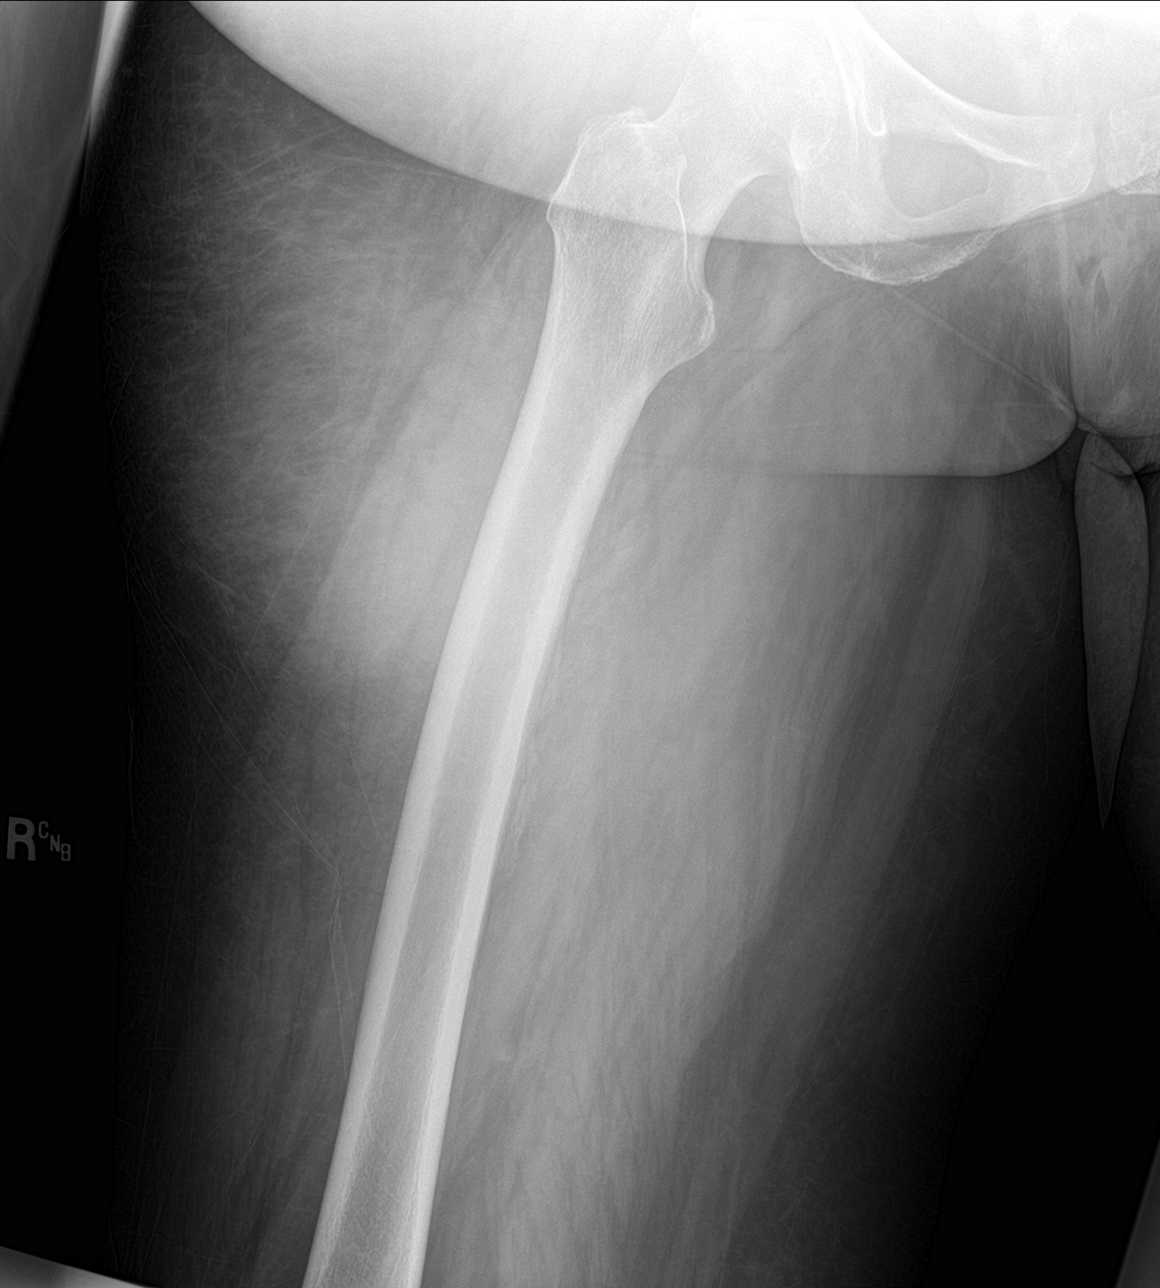

[3 of 3 positions shown; findings below may reference images not displayed]

FINDINGS: No fracture or dislocation is seen.

Bilateral hip joint spaces are preserved.

Visualized bony pelvis appears intact.
IMPRESSION: Negative.

## 2018-06-19 NOTE — Progress Notes (Signed)
Chart and office note reviewed in detail  > agree with a/p as outlined    

## 2018-06-25 ENCOUNTER — Other Ambulatory Visit: Payer: Self-pay | Admitting: *Deleted

## 2018-06-25 ENCOUNTER — Ambulatory Visit (INDEPENDENT_AMBULATORY_CARE_PROVIDER_SITE_OTHER): Payer: Medicare HMO | Admitting: *Deleted

## 2018-06-25 DIAGNOSIS — Z5181 Encounter for therapeutic drug level monitoring: Secondary | ICD-10-CM | POA: Diagnosis not present

## 2018-06-25 DIAGNOSIS — Z950 Presence of cardiac pacemaker: Secondary | ICD-10-CM | POA: Diagnosis not present

## 2018-06-25 DIAGNOSIS — I471 Supraventricular tachycardia: Secondary | ICD-10-CM | POA: Diagnosis not present

## 2018-06-25 LAB — PROTIME-INR
INR: 7.7 — AB (ref 0.8–1.2)
Prothrombin Time: 72.6 s — ABNORMAL HIGH (ref 9.1–12.0)

## 2018-06-25 LAB — POCT INR: INR: 7 — AB (ref 2.0–3.0)

## 2018-06-25 NOTE — Patient Outreach (Signed)
[facility]) Care Management  06/25/2018  [name] 11/19/1940 [id number]  Scheduled home visit today however upon arriving to pt's home she indicated that she had a scheulded provider's appointment and would not be able to have the scheduled [facility] appointment. RN offered to follow up later in the week for rescheduled today's appointment (pt receptive).  Will call pt later in this week and rescheduled today appointment.  [name], RN Care Management Coordinator [place] Office [phone]

## 2018-06-26 NOTE — Patient Instructions (Signed)
Description   Spoke with pt and instructed pt not take any Coumadin today and do not take any Coumadin Thursday, Friday, and Saturday then resume taking 1 and 1/2 tablets every day except 2 tablets on Sundays. Recheck in Wednesday. Continue eating your leafy green vegetables 3 servings each week.  Call Coumadin Clinic with any new or different medications or procedures #  [phone] Pt using pill box now.

## 2018-07-03 ENCOUNTER — Ambulatory Visit (INDEPENDENT_AMBULATORY_CARE_PROVIDER_SITE_OTHER): Payer: Medicare HMO | Admitting: *Deleted

## 2018-07-03 DIAGNOSIS — I471 Supraventricular tachycardia, unspecified: Secondary | ICD-10-CM

## 2018-07-03 DIAGNOSIS — Z950 Presence of cardiac pacemaker: Secondary | ICD-10-CM

## 2018-07-03 DIAGNOSIS — Z5181 Encounter for therapeutic drug level monitoring: Secondary | ICD-10-CM

## 2018-07-03 LAB — POCT INR: INR: 2.8 (ref 2.0–3.0)

## 2018-07-03 NOTE — Patient Instructions (Signed)
Description   Continue taking 1 and 1/2 tablets every day except 2 tablets on Sundays. Recheck in 2 weeks. Continue eating your leafy green vegetables 3 servings each week.  Call Coumadin Clinic with any new or different medications or procedures #  [phone] Pt using pill box now.

## 2018-07-11 ENCOUNTER — Other Ambulatory Visit: Payer: Self-pay | Admitting: *Deleted

## 2018-07-11 NOTE — Patient Outreach (Signed)
[facility]) Care Management  07/11/2018  [name] [birth year] [id number]    RN spoke with pt today and rescheduled the missed appointment for last month. Pt has requested a late afternoon appointment for next week. Will review and re-evaluate the current plan of care and adjust interventions accordingly at that time.  [name], RN Care Management Coordinator [place] Office [phone]

## 2018-07-16 ENCOUNTER — Other Ambulatory Visit: Payer: Self-pay | Admitting: *Deleted

## 2018-07-16 NOTE — Patient Outreach (Signed)
[facility]) Care Management   07/16/2018  Dajon Rowe 08/11/1940 [id number]  Johnnie Moten is an 77 y.o. female  Subjective:  HF: Pt reports she does not weigh daily but will try to get on the scales due to swelling to her LE. Pt denies any symptoms of HF with no issues however then her daily swelling when she does not elevate her LE. Pt verifies she is taking her HF medication with no delays and continue to be aware of the HR zones with no weight changes however pt has not been weighing daily.  EDEMA: Pt indicates LE edema is a history but no swelling in the AM upon arising however if she does not elevate throughout the day will have swelling.   Objective:   Review of Systems  Constitutional: Negative.   HENT: Negative.   Eyes: Negative.   Respiratory: Positive for sputum production.        Reports white to clear in color with no fevers or GI upset.  Cardiovascular: Negative.   Gastrointestinal: Negative.   Genitourinary: Negative.   Musculoskeletal: Negative.   Skin: Negative.   Neurological: Negative.   Endo/Heme/Allergies: Negative.   Psychiatric/Behavioral: Negative.     Physical Exam  Constitutional: She is oriented to person, place, and time. She appears well-developed and well-nourished.  HENT:  Right Ear: External ear normal.  Left Ear: External ear normal.  Eyes: EOM are normal.  Neck: Normal range of motion.  Cardiovascular: Normal heart sounds.  Respiratory: Effort normal and breath sounds normal.  GI: Bowel sounds are normal.  Musculoskeletal: She exhibits edema.  Note bilateral edema at 4 + (History)  Neurological: She is alert and oriented to person, place, and time.  Skin: Skin is warm and dry.  Psychiatric: She has a normal mood and affect. Her behavior is normal. Judgment and thought content normal.    Encounter Medications:   Outpatient Encounter Medications as of 07/16/2018  Medication Sig  . Bromfenac Sodium (PROLENSA) 0.07 %  SOLN Apply to eye as directed.  . budesonide-formoterol (SYMBICORT) 80-4.5 MCG/ACT inhaler Inhale 2 puffs into the lungs 2 (two) times daily.  . furosemide (LASIX) 40 MG tablet Take 40 mg by mouth 2 (two) times daily. Patient takes 2 pills once a day around 1000  . levalbuterol (XOPENEX HFA) 45 MCG/ACT inhaler Inhale 2 puffs into the lungs every 4 (four) hours as needed for wheezing.  . montelukast (SINGULAIR) 10 MG tablet TAKE 1 TABLET BY MOUTH AT BEDTIME EVERY NIGHT  . omeprazole (PRILOSEC) 20 MG capsule Take 1 capsule (20 mg total) by mouth daily. Take 30-60 min before first meal of the day  . Propylene Glycol-Glycerin (SOOTHE) 0.6-0.6 % SOLN Apply to eye as directed.  Marland Kitchen spironolactone (ALDACTONE) 25 MG tablet TAKE ONE TABLET BY MOUTH ONCE DAILY  . valsartan (DIOVAN) 160 MG tablet Take 160 mg by mouth daily.  Marland Kitchen warfarin (COUMADIN) 3 MG tablet TAKE 1 & 1/2-2 TABLETS BY MOUTH DAILY OR AS DIRECTED BY COUMADIN CLINIC   No facility-administered encounter medications on file as of 07/16/2018.     Functional Status:   In your present state of health, do you have any difficulty performing the following activities: 05/27/2018 05/22/2018  Hearing? N N  Vision? N N  Difficulty concentrating or making decisions? Y N  Walking or climbing stairs? Y Y  Dressing or bathing? N N  Doing errands, shopping? Tempie Donning  Preparing Food and eating ? N N  Using  the Toilet? N N  In the past six months, have you accidently leaked urine? Y Y  Do you have problems with loss of bowel control? N N  Managing your Medications? N N  Managing your Finances? N N  Housekeeping or managing your Housekeeping? Y Y  Some recent data might be hidden    Fall/Depression Screening:    Fall Risk  05/27/2018 05/22/2018  Falls in the past year? Yes Yes  Number falls in past yr: 1 1  Injury with Fall? No No  Risk for fall due to : Impaired balance/gait;Impaired mobility Impaired balance/gait  Follow up Education provided;Falls  prevention discussed Falls prevention discussed   PHQ 2/9 Scores 05/27/2018 05/21/2018  PHQ - 2 Score 1 3  PHQ- 9 Score 3 15    Assessment:   Ongoing case management related to HF Bilateral edema related  LE swelling   Plan:  Will reiterate on HF zones and strongly encouraged pt to monitor this condition more closely with daily weights and document all readings for providers to view. Will educate further on if 3  Lbs gained overnight or 5 lbs within the one week her provider needs to be contacted for possible interventions. Pt verbalized an understanding and will comply. Will strongly discuss elevating her LE to reduce ongoing swelling and provider a community resources Music therapist, [place], [place]) as a alterative for compressions stockings to fit in her size (possible size 4-5 X with 4+ edema noted).  Again will strongly encouraged elevation to reduce and the risk involved if she continue to have swelling to her LE. Note pt with pacemaker. Plan of care discussed in detail with goals and interventions adjusted accordingly based upon pt's progress. Will continue to encouraged adherence in managing her HF.  THN CM Care Plan Problem One     Most Recent Value  Care Plan Problem One  Knowledge Deficit in managing her CHF   Role Documenting the Problem One  Care Management Coordinator  Care Plan for Problem One  Active  THN Long Term Goal   Pt will have an increase knwledge base on how to manage her CHF over the next 90 days  THN Long Term Goal Start Date  05/15/18  Interventions for Problem One Long Term Goal  Will strongly encourged adherence and reiterate on the HF zones and  verify pt has edema noted however reports she is reduced to minimal in AM upon arrival. Pt aware of the improtance of what to do if acute symptoms occur. Will encouraged adherence and further educate on HF.   THN CM Short Term Goal #1   Pt will adhere to recognizing s/s and weigh daily to aovid acute symptoms over the  next 30 days  THN CM Short Term Goal #1 Start Date  05/22/18  Interventions for Short Term Goal #1  Will verifiy if pt ocntinue to weigh daily and strongly encouraged adherence with this goal. Pt has not been weighing daily will extend to allow adherence with this goal  THN CM Short Term Goal #2   Follow a low sodium diet related to CHF over the next 30 days  THN CM Short Term Goal #2 Start Date  05/22/18  [facility] CM Short Term Goal #2 Met Date  07/16/18  THN CM Short Term Goal #3  Bilateral edema will reduce within the next 30 days  THN CM Short Term Goal #3 Start Date  07/16/18  Interventions for Short Tern Goal #3  Will strongly encouraged  elevation and discussed possible TED and provide resource for compression stockings large enough to accommendate pt's level of edema.     [name], RN Care Management Coordinator [place] Office [phone]

## 2018-07-17 ENCOUNTER — Ambulatory Visit (INDEPENDENT_AMBULATORY_CARE_PROVIDER_SITE_OTHER): Payer: Medicare HMO | Admitting: *Deleted

## 2018-07-17 DIAGNOSIS — Z5181 Encounter for therapeutic drug level monitoring: Secondary | ICD-10-CM

## 2018-07-17 DIAGNOSIS — Z950 Presence of cardiac pacemaker: Secondary | ICD-10-CM

## 2018-07-17 DIAGNOSIS — I471 Supraventricular tachycardia: Secondary | ICD-10-CM

## 2018-07-17 LAB — POCT INR: INR: 1.9 — AB (ref 2.0–3.0)

## 2018-07-17 NOTE — Patient Instructions (Signed)
Description   Today take 2 tablets then continue taking 1 and 1/2 tablets every day except 2 tablets on Sundays. Recheck in 2 weeks. Continue eating your leafy green vegetables 3 servings each week.  Call [facility] Clinic with any new or different medications or procedures #  [phone] Pt using pill box now.

## 2018-07-23 DIAGNOSIS — H353121 Nonexudative age-related macular degeneration, left eye, early dry stage: Secondary | ICD-10-CM | POA: Diagnosis not present

## 2018-07-23 DIAGNOSIS — H11823 Conjunctivochalasis, bilateral: Secondary | ICD-10-CM | POA: Diagnosis not present

## 2018-07-23 DIAGNOSIS — H11153 Pinguecula, bilateral: Secondary | ICD-10-CM | POA: Diagnosis not present

## 2018-07-23 DIAGNOSIS — H0102B Squamous blepharitis left eye, upper and lower eyelids: Secondary | ICD-10-CM | POA: Diagnosis not present

## 2018-07-23 DIAGNOSIS — H0102A Squamous blepharitis right eye, upper and lower eyelids: Secondary | ICD-10-CM | POA: Diagnosis not present

## 2018-07-23 DIAGNOSIS — H34232 Retinal artery branch occlusion, left eye: Secondary | ICD-10-CM | POA: Diagnosis not present

## 2018-07-23 DIAGNOSIS — H04123 Dry eye syndrome of bilateral lacrimal glands: Secondary | ICD-10-CM | POA: Diagnosis not present

## 2018-07-23 DIAGNOSIS — H43812 Vitreous degeneration, left eye: Secondary | ICD-10-CM | POA: Diagnosis not present

## 2018-07-23 DIAGNOSIS — H18413 Arcus senilis, bilateral: Secondary | ICD-10-CM | POA: Diagnosis not present

## 2018-07-26 DIAGNOSIS — G4733 Obstructive sleep apnea (adult) (pediatric): Secondary | ICD-10-CM | POA: Diagnosis not present

## 2018-07-26 DIAGNOSIS — J449 Chronic obstructive pulmonary disease, unspecified: Secondary | ICD-10-CM | POA: Diagnosis not present

## 2018-07-26 DIAGNOSIS — I10 Essential (primary) hypertension: Secondary | ICD-10-CM | POA: Diagnosis not present

## 2018-07-26 DIAGNOSIS — E785 Hyperlipidemia, unspecified: Secondary | ICD-10-CM | POA: Diagnosis not present

## 2018-07-26 DIAGNOSIS — I509 Heart failure, unspecified: Secondary | ICD-10-CM | POA: Diagnosis not present

## 2018-07-26 DIAGNOSIS — J45909 Unspecified asthma, uncomplicated: Secondary | ICD-10-CM | POA: Diagnosis not present

## 2018-08-01 ENCOUNTER — Ambulatory Visit (INDEPENDENT_AMBULATORY_CARE_PROVIDER_SITE_OTHER): Payer: Medicare HMO | Admitting: *Deleted

## 2018-08-01 DIAGNOSIS — I471 Supraventricular tachycardia: Secondary | ICD-10-CM

## 2018-08-01 DIAGNOSIS — Z950 Presence of cardiac pacemaker: Secondary | ICD-10-CM | POA: Diagnosis not present

## 2018-08-01 DIAGNOSIS — Z5181 Encounter for therapeutic drug level monitoring: Secondary | ICD-10-CM | POA: Diagnosis not present

## 2018-08-01 LAB — POCT INR: INR: 1.2 — AB (ref 2.0–3.0)

## 2018-08-01 NOTE — Patient Instructions (Signed)
Description   Today and tomorrow take 2 tablets then continue taking 1 and 1/2 tablets every day except 2 tablets on Sundays. Recheck in 1 week. Continue eating your leafy green vegetables 3 servings each week.  Call Coumadin Clinic with any new or different medications or procedures #  [phone] Pt using pill box now.

## 2018-08-09 ENCOUNTER — Ambulatory Visit (INDEPENDENT_AMBULATORY_CARE_PROVIDER_SITE_OTHER): Payer: Medicare HMO | Admitting: *Deleted

## 2018-08-09 DIAGNOSIS — Z5181 Encounter for therapeutic drug level monitoring: Secondary | ICD-10-CM

## 2018-08-09 DIAGNOSIS — I471 Supraventricular tachycardia: Secondary | ICD-10-CM | POA: Diagnosis not present

## 2018-08-09 DIAGNOSIS — Z950 Presence of cardiac pacemaker: Secondary | ICD-10-CM | POA: Diagnosis not present

## 2018-08-09 LAB — POCT INR: INR: 1.3 — AB (ref 2.0–3.0)

## 2018-08-09 NOTE — Patient Instructions (Signed)
Description   Today and tomorrow take 2 tablets then change your dose to  1 and 1/2 tablets every day except 2 tablets on Sundays and Thursdays. Recheck in 1 week. Continue eating your leafy green vegetables 3 servings each week.  Call Coumadin Clinic with any new or different medications or procedures #  [phone] Pt using pill box now.

## 2018-08-12 ENCOUNTER — Ambulatory Visit: Payer: Medicare HMO | Admitting: Internal Medicine

## 2018-08-15 ENCOUNTER — Other Ambulatory Visit: Payer: Self-pay | Admitting: *Deleted

## 2018-08-15 ENCOUNTER — Ambulatory Visit: Payer: Medicare HMO | Admitting: Internal Medicine

## 2018-08-15 NOTE — Patient Outreach (Signed)
[facility]) Care Management   08/15/2018  Rakhi Romagnoli Oct 22, 1940 [id number]  [name] is an 78 y.o. female  Subjective:  HF: Pt able to recite some of the signs and symptoms of CHF however states she has not been weighing daily but indicated her last weight was 260 lbs on last Sunday with some ongoing swelling to her LE. Denies any other symptoms with no SOB and states she continue to take the lasix medication 2 tabs BID. Pt states she feels a lot better with her breathing.  EDEMA: Pt states she has not been able to visit the agency to obtain compression stockings but states when she elevates her legs the swelling is reduced however today much ambulation with the noted swelling.   Objective:   Review of Systems  Constitutional: Negative.   HENT: Negative.   Eyes: Negative.   Respiratory: Negative.   Cardiovascular: Positive for leg swelling.       History of bilateral edema (4+) during the day when ambulating but reduced with elevation.  Gastrointestinal: Negative.   Genitourinary: Negative.   Musculoskeletal: Negative.   Skin: Negative.   Neurological: Negative.   Endo/Heme/Allergies: Negative.   Psychiatric/Behavioral: Negative.     Physical Exam  Constitutional: She is oriented to person, place, and time. She appears well-developed and well-nourished.  HENT:  Right Ear: External ear normal.  Left Ear: External ear normal.  Eyes: EOM are normal.  Neck: Normal range of motion.  Cardiovascular: Normal heart sounds.  Respiratory: Effort normal and breath sounds normal.  GI: Soft. Bowel sounds are normal.  Musculoskeletal:        General: Edema present.     Comments: Bilateral edema (4+) during the day. Reduced with elevation.  Neurological: She is alert and oriented to person, place, and time.  Skin: Skin is warm and dry.  Psychiatric: She has a normal mood and affect. Her behavior is normal. Judgment and thought content normal.    Encounter  Medications:   Outpatient Encounter Medications as of 08/15/2018  Medication Sig  . Bromfenac Sodium (PROLENSA) 0.07 % SOLN Apply to eye as directed.  . budesonide-formoterol (SYMBICORT) 80-4.5 MCG/ACT inhaler Inhale 2 puffs into the lungs 2 (two) times daily.  . furosemide (LASIX) 40 MG tablet Take 40 mg by mouth 2 (two) times daily. Patient takes 2 pills once a day around 1000  . levalbuterol (XOPENEX HFA) 45 MCG/ACT inhaler Inhale 2 puffs into the lungs every 4 (four) hours as needed for wheezing.  . montelukast (SINGULAIR) 10 MG tablet TAKE 1 TABLET BY MOUTH AT BEDTIME EVERY NIGHT  . omeprazole (PRILOSEC) 20 MG capsule Take 1 capsule (20 mg total) by mouth daily. Take 30-60 min before first meal of the day  . Propylene Glycol-Glycerin (SOOTHE) 0.6-0.6 % SOLN Apply to eye as directed.  Marland Kitchen spironolactone (ALDACTONE) 25 MG tablet TAKE ONE TABLET BY MOUTH ONCE DAILY  . valsartan (DIOVAN) 160 MG tablet Take 160 mg by mouth daily.  Marland Kitchen warfarin (COUMADIN) 3 MG tablet TAKE 1 & 1/2-2 TABLETS BY MOUTH DAILY OR AS DIRECTED BY COUMADIN CLINIC   No facility-administered encounter medications on file as of 08/15/2018.     Functional Status:   In your present state of health, do you have any difficulty performing the following activities: 05/27/2018 05/22/2018  Hearing? N N  Vision? N N  Difficulty concentrating or making decisions? Y N  Walking or climbing stairs? Y Y  Dressing or bathing? N N  Doing errands, shopping? Tempie Donning  Preparing Food and eating ? N N  Using the Toilet? N N  In the past six months, have you accidently leaked urine? Y Y  Do you have problems with loss of bowel control? N N  Managing your Medications? N N  Managing your Finances? N N  Housekeeping or managing your Housekeeping? Y Y  Some recent data might be hidden    Fall/Depression Screening:    Fall Risk  05/27/2018 05/22/2018  Falls in the past year? Yes Yes  Number falls in past yr: 1 1  Injury with Fall? No No  Risk  for fall due to : Impaired balance/gait;Impaired mobility Impaired balance/gait  Follow up Education provided;Falls prevention discussed Falls prevention discussed   PHQ 2/9 Scores 05/27/2018 05/21/2018  PHQ - 2 Score 1 3  PHQ- 9 Score 3 15   BP 120/62 (BP Location: Left Arm, Patient Position: Sitting, Cuff Size: Normal)   Pulse 78   Resp 20   Ht 1.702 m (5' 7")   Wt 260 lb (117.9 kg)   SpO2 98%   BMI 40.72 kg/m  Assessment:   Ongoing case management related to CHF Follow up on bilateral swelling related to LE  Plan:  Will verify pt continue to manage her HF however will stongely encourage pt on the importance of daily weights related to the increase risk of HF symptoms if unmonitored. Will extend this goal to allow adherence and strongly encouraged pt to weigh daily. Will verify pt remains in the GREEN zone other then her ongoing history of bilateral edema and inquired on pt's knowledge base with reciting possible symptoms that can be encountered. Pt limited however will verify pt needs ongoing review on this condition and time to better manage this condition. Will strongly encourage pt to elevate her LE to reduce ongoing swelling to lower the risk involved with her HF. Will also encouraged pt to obtain the necessary compression stockings if needed to reduce her swelling. Will also encouraged pt to lubricate her LE to avoid skin irritation due to the increased swelling.   Will review the current plan of care and adjust interventions accordingly to allo adherence. Will re-evaluate next month and address accordingly. Will discuss health coach for next month if pt continue to improve with her ongoing management of care.   THN CM Care Plan Problem One     Most Recent Value  Care Plan Problem One  Knowledge Deficit in managing her CHF   Role Documenting the Problem One  Care Management Coordinator  Care Plan for Problem One  Active  THN Long Term Goal   Pt will have an increase knwledge  base on how to manage her CHF over the next 90 days  THN Long Term Goal Start Date  05/15/18  Interventions for Problem One Long Term Goal  Pt admit she has not weighed  as much due to the [place]. Will strongly encouraged daily weights and the risk involved if pt does not monitor this condition. WIill extend to allow adherence over the next month. Will review the s/s once again of HF and what to do if acute symtpoms should occur.   THN CM Short Term Goal #1   Pt will adhere to recognizing s/s and weigh daily to aovid acute symptoms over the next 30 days  THN CM Short Term Goal #1 Start Date  05/22/18  [facility] CM Short Term Goal #1 Met Date  08/15/18  [facility] CM Short  Term Goal #3  Bilateral edema will reduce within the next 30 days  THN CM Short Term Goal #3 Start Date  07/16/18  Interventions for Short Tern Goal #3  Will continue to extend this goal with noted bilateral swelling as pt has not been able to obtain the necessary TEDs for her size via resource provided on last home visit. Will strongly encouraged pt to elevate as much as possible to reduce swelling as pt indicated alot less swelling when she elevated the way she should. Will re-evaluate next month on edema and interventions needed to reduce ongoing swelling.      [name], RN Care Management Coordinator [place] Office [phone]

## 2018-08-16 ENCOUNTER — Ambulatory Visit (INDEPENDENT_AMBULATORY_CARE_PROVIDER_SITE_OTHER): Payer: Medicare HMO | Admitting: *Deleted

## 2018-08-16 DIAGNOSIS — Z5181 Encounter for therapeutic drug level monitoring: Secondary | ICD-10-CM | POA: Diagnosis not present

## 2018-08-16 DIAGNOSIS — I471 Supraventricular tachycardia: Secondary | ICD-10-CM

## 2018-08-16 DIAGNOSIS — Z950 Presence of cardiac pacemaker: Secondary | ICD-10-CM

## 2018-08-16 LAB — POCT INR: INR: 2.4 (ref 2.0–3.0)

## 2018-08-16 NOTE — Patient Instructions (Addendum)
Description   Continue taking 1 and 1/2 tablets every day except 2 tablets on Sundays and Thursdays. Recheck in 2 weeks. Continue eating your leafy green vegetables 2 servings each week.  Call Coumadin Clinic with any new or different medications or procedures #  [phone] Pt using pill box now.

## 2018-08-20 ENCOUNTER — Ambulatory Visit: Payer: Medicare HMO | Admitting: Internal Medicine

## 2018-08-30 ENCOUNTER — Ambulatory Visit (INDEPENDENT_AMBULATORY_CARE_PROVIDER_SITE_OTHER): Payer: Medicare HMO | Admitting: *Deleted

## 2018-08-30 DIAGNOSIS — I471 Supraventricular tachycardia: Secondary | ICD-10-CM | POA: Diagnosis not present

## 2018-08-30 DIAGNOSIS — Z5181 Encounter for therapeutic drug level monitoring: Secondary | ICD-10-CM

## 2018-08-30 DIAGNOSIS — Z950 Presence of cardiac pacemaker: Secondary | ICD-10-CM

## 2018-08-30 LAB — POCT INR: INR: 1.9 — AB (ref 2.0–3.0)

## 2018-08-30 NOTE — Patient Instructions (Signed)
Description   Today take 2 tablets then continue taking 1 and 1/2 tablets every day except 2 tablets on Sundays and Thursdays. Recheck in 2 weeks. Continue eating your leafy green vegetables 2 servings each week.  Call Coumadin Clinic with any new or different medications or procedures #  [phone] Pt using pill box now.

## 2018-09-03 NOTE — Telephone Encounter (Signed)
Error. Will close this encounter.

## 2018-09-06 ENCOUNTER — Encounter: Payer: Self-pay | Admitting: Internal Medicine

## 2018-09-06 ENCOUNTER — Ambulatory Visit (INDEPENDENT_AMBULATORY_CARE_PROVIDER_SITE_OTHER): Payer: Medicare HMO | Admitting: Internal Medicine

## 2018-09-06 DIAGNOSIS — J4530 Mild persistent asthma, uncomplicated: Secondary | ICD-10-CM

## 2018-09-06 DIAGNOSIS — Z6841 Body Mass Index (BMI) 40.0 and over, adult: Secondary | ICD-10-CM | POA: Diagnosis not present

## 2018-09-06 NOTE — Patient Instructions (Addendum)
Plan A = Automatic = symbiocort 160 Take 2 puffs first thing in am and then another 2 puffs about 12 hours later.    Work on inhaler technique:  relax and gently blow all the way out then take a nice smooth deep breath back in, triggering the inhaler at same time you start breathing in.  Hold for up to 5 seconds if you can. Blow out thru nose. Rinse and gargle with water when done      Plan B = Backup Only use your levoalbuterol inhaler as a rescue medication to be used if you can't catch your breath by resting or doing a relaxed purse lip breathing pattern.  - The less you use it, the better it will work when you need it. - Ok to use the inhaler up to 2 puffs  every 4 hours if you must but call for appointment if use goes up over your usual need - Don't leave home without it !!  (think of it like the spare tire for your car)         Please schedule a follow up visit in 3 months but call sooner if needed  with all medications /inhalers/ solutions in hand so we can verify exactly what you are taking. This includes all medications from all doctors and over the counters  - too see Aaron Edelman

## 2018-09-06 NOTE — Progress Notes (Signed)
Subjective:    Patient ID: [name], female    DOB: 12-12-1940,    MRN: [id number]    Brief patient profile:  89 yobf  [name] quit smoking in 1979 saw Dr Gerilyn Nestle for asthma rx with  shots and inhalers last seen with no cough still doe related to obesity in 2011 by Dr Annamaria Boots s/p Admit:     Admit date: 01/04/2015 Discharge date: 01/11/2015  Admission Diagnoses: Shortness of breath and leg edema r/o chf Obesity Hypertension Asthmatic bronchitis  Discharge Diagnoses:  Acute asthmatic bronchitis Obesity Hypertension Hypokalemia-resolved S/P Permanent pacemaker     Hospital Course: 78 year old black female with asthma has shortness of breath and leg edema. No fever. Some chest tightness. She also has bilateral leg edema x 1 week. She has pacemaker insertion in 02/2011. She had Chest x-ray negative for pulmonary edema and her BNP was low at 45.7 pg/mL. Her echocardiogram had no wall motion abnormality with EF of 50 %. Her leg swelling improved with intermittent lasix use. Her respiratory function improved with antibiotic and bronchodilator along with solumedrol use. She was discharged home in stable condition with discontinuation of most anti-hypertensive medications and decrease of Coreg to 6.25 mg. twice daily. Her hyperglycemia resolved with diet. She will be followed by Dr. Montez Morita in 2 weeks and by me as needed.   02/02/2015 1st [facility] office visit/ [name]   Chief Complaint  Patient presents with  . Pulmonary Consult    Former pt of Dr. Annamaria Boots. Pt c/o increased SOB, esp at night for the past 5 wks. She also c/o increased cough-prod with white sputum.    back near baseline doe x long walk but still bothered by noct coughing min productive on xopenex and atrovent neb but no inhalers or ICS and def better while on systemic steroids now tapering  Off.  Does not remember why/ when stopped singulair   rec Stop carevidol Start bisoprolol 5 mg daily  Restart singulair 10 mg daily    Only use your nebulizer for rescue     Try prilosec 20mg   Take 30-60 min before first meal of the day and Pepcid (famotidine) 20 mg one bedtime until return GERD diet   12/10/2015 NP Follow up : Asthma  Patient returns for 4 week follow-up. Patient with asthma flare last visit with cough and shortness of breath. Patient was restarted on Dulera. Labs done last visit showed a normal CBC, TSH. IgE was elevated at 651 with positive RAST to cockroach, dust and grass. She was referred to Dr. Carmelina Peal, allergist.  She is feeling much better . Wheezing , cough and dyspnea are decreased.  Needs refill of singulair . Needs rx sent to [facility] to pharm .  Reviewed all her meds and organized them into a med calendar with pt educaiton .  rec Follow med calendar closely and bring to each visit.  Continue on current regimen  Follow up with Allergist as planned    03/28/2016 NP  Follow up : Asthma  Pt presents for a 2 week follow up and med review  We reviewed all her meds and organzied them into a med calendar with pt education .  Appears to be taking correctly.  Says overall breathing is doing ok. Started on Romeo last ov. Feels it is helping.  No chest pain, orthopnea, increased edema or fever.  We discussed Prevnar 13 vaccine , she would think about it till next visit.  rec Follow med calendar  closely and bring to each visit.  Continue on current regimen     09/06/2018  f/u ov/[name] re: asthma on symb 80 2bid  Though hfa poor/ MO Chief Complaint  Patient presents with  . Follow-up    Breathing is about the same. She is using her rescue inhaler 3-4 x per day.   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    without nocturnal  or early am exacerbation  of respiratory  c/o's or need for noct saba. Also denies any obvious fluctuation of symptoms with weather or environmental changes or other aggravating or alleviating factors except as outlined above   No unusual exposure hx or h/o childhood pna/ asthma or knowledge of premature birth.  Current Allergies, Complete Past Medical History, Past Surgical History, Family History, and Social History were reviewed in [employer] record.  ROS  The following are not active complaints unless bolded Hoarseness, sore throat, dysphagia, dental problems, itching, sneezing,  nasal congestion or discharge of excess mucus or purulent secretions, ear ache,   fever, chills, sweats, unintended wt loss or wt gain, classically pleuritic or exertional cp,  orthopnea pnd or arm/hand swelling  or leg swelling, presyncope, palpitations, abdominal pain, anorexia, nausea, vomiting, diarrhea  or change in bowel habits or change in bladder habits, change in stools or change in urine, dysuria, hematuria,  rash, arthralgias, visual complaints, headache, numbness, weakness or ataxia or problems with walking or coordination,  change in mood or  memory.        Current Meds  - NOTE:   Unable to verify as accurately reflecting what pt takes     Medication Sig  . Bromfenac Sodium (PROLENSA) 0.07 % SOLN Apply to eye as directed.  . budesonide-formoterol (SYMBICORT) 80-4.5 MCG/ACT inhaler Inhale 2 puffs into the lungs 2 (two) times daily.  . furosemide (LASIX) 40 MG tablet Take 40 mg by mouth 2 (two) times daily. Patient takes 2 pills once a day around 1000  . levalbuterol (XOPENEX HFA) 45 MCG/ACT inhaler Inhale 2 puffs into the lungs every 4 (four) hours as needed for  wheezing.  . montelukast (SINGULAIR) 10 MG tablet TAKE 1 TABLET BY MOUTH AT BEDTIME EVERY NIGHT  . omeprazole (PRILOSEC) 20 MG capsule Take 1 capsule (20 mg total) by mouth daily. Take 30-60 min before first meal of the day  . Propylene Glycol-Glycerin (SOOTHE) 0.6-0.6 % SOLN Apply to eye as directed.  Marland Kitchen spironolactone (ALDACTONE) 25 MG tablet TAKE ONE TABLET BY MOUTH ONCE DAILY  . valsartan (DIOVAN) 160 MG tablet Take 160 mg by mouth daily.  Marland Kitchen warfarin (COUMADIN) 3 MG tablet TAKE 1 & 1/2-2 TABLETS BY MOUTH DAILY OR AS DIRECTED BY COUMADIN CLINIC                       Objective:   Physical Exam  Massively obese bf nad   02/16/2015       273 >  04/29/2015   280  >267 05/13/2015 > 06/25/2015   270 > 11/15/2015   284  > 03/13/2016   273 > 07/03/2016 286 > 09/06/2018   263     HEENT: Upper dentures. Nl  turbinates bilaterally, and oropharynx. Nl external ear canals without cough reflex   NECK :  without JVD/Nodes/TM/ nl carotid upstrokes bilaterally   LUNGS: no acc muscle use,  Nl contour chest which is clear to A and P bilaterally without cough on insp or exp maneuvers   CV:  RRR  no s3 or murmur or increase in P2, and 1-2+ pitting L >R   ABD:  soft and nontender with nl inspiratory excursion in the supine position. No bruits or organomegaly appreciated, bowel sounds nl  MS: very slow gait/ ext warm without deformities, calf tenderness, cyanosis or clubbing No obvious joint restrictions   SKIN: warm and dry without lesions    NEURO:  alert, approp, nl sensorium with  no motor or cerebellar deficits apparent.

## 2018-09-07 ENCOUNTER — Encounter: Payer: Self-pay | Admitting: Internal Medicine

## 2018-09-07 NOTE — Assessment & Plan Note (Signed)
Onset ? 1980s - spirometry 02/02/15 > restrictive only  - added back singulair 02/03/2015 >>>  - spirometry 04/29/2015 with low mid flows only  - 11/15/2015   resume dulera 100  - Allergy profile  11/15/15 >  Eos 0.2 /  IgE  651  Dust/roach > grass   -med calendar 12/10/2015 > did not recognize copy 03/13/2016 , 03/28/16  - 03/13/2016    re-try dulera 100 2bid - 09/06/2018  After extensive coaching inhaler device,  effectiveness =    75% (short Ti)    Overuse of saba only occurs with doe which is not variable and more typical of obesity/ deconditioning  I spent extra time with pt today reviewing appropriate use of albuterol for prn use on exertion with the following points: 1) saba is for relief of sob that does not improve by walking a slower pace or resting but rather if the pt does not improve after trying this first. 2) If the pt is convinced, as many are, that saba helps recover from activity faster then it's easy to tell if this is the case by re-challenging : ie stop, take the inhaler, then p 5 minutes try the exact same activity (intensity of workload) that just caused the symptoms and see if they are substantially diminished or not after saba 3) if there is an activity that reproducibly causes the symptoms, try the saba 15 min before the activity on alternate days   If in fact the saba really does help, then fine to continue to use it prn but advised may need to look closer at the maintenance regimen being used to achieve better control of airways disease with exertion.

## 2018-09-07 NOTE — Assessment & Plan Note (Signed)
Body mass index is 41.19 kg/m.  -  trending down, encouraged  Lab Results  Component Value Date   TSH 1.58 11/15/2015     Contributing to gerd risk/ doe/reviewed the need and the process to achieve and maintain neg calorie balance > defer f/u primary care including intermittently monitoring thyroid status     I had an extended discussion with the patient reviewing all relevant studies completed to date and  lasting 15 to 20 minutes of a 25 minute visit    See device teaching which extended face to face time for this visit.  Each maintenance medication was reviewed in detail including emphasizing most importantly the difference between maintenance and prns and under what circumstances the prns are to be triggered using an action plan format that is not reflected in the computer generated alphabetically organized AVS which I have not found useful in most complex patients, especially with respiratory illnesses  Please see AVS for specific instructions unique to this visit that I personally wrote and verbalized to the the pt in detail and then reviewed with pt  by my nurse highlighting any  changes in therapy recommended at today's visit to their plan of care.

## 2018-09-13 ENCOUNTER — Ambulatory Visit (INDEPENDENT_AMBULATORY_CARE_PROVIDER_SITE_OTHER): Payer: Medicare HMO | Admitting: Pharmacist

## 2018-09-13 DIAGNOSIS — Z950 Presence of cardiac pacemaker: Secondary | ICD-10-CM

## 2018-09-13 DIAGNOSIS — Z5181 Encounter for therapeutic drug level monitoring: Secondary | ICD-10-CM | POA: Diagnosis not present

## 2018-09-13 DIAGNOSIS — I471 Supraventricular tachycardia: Secondary | ICD-10-CM

## 2018-09-13 LAB — POCT INR: INR: 1.8 — AB (ref 2.0–3.0)

## 2018-09-13 NOTE — Patient Instructions (Signed)
Today take 2 tablets then continue taking 1 and 1/2 tablets every day except 2 tablets on Sundays and Thursdays. Recheck in 2 weeks. Continue eating your leafy green vegetables 2 servings each week.  Call Coumadin Clinic with any new or different medications or procedures #  [phone]

## 2018-09-16 ENCOUNTER — Other Ambulatory Visit: Payer: Self-pay | Admitting: *Deleted

## 2018-09-16 NOTE — Patient Outreach (Signed)
[facility]) Care Management  09/16/2018  Crysta Gulick 1940/12/10 [id number]    Telephone Assessment (referral to Health Coach).  RN spoke with pt today and verifiy ongoing case management related to HF. Pt states she is in the GREEN zone with no swelling this morning however throughout the day pt has a history os swelling. Pt is aware to elevate her LE to reduce ongoing swelling throughout the daily. No weights reported as pt awakes late in the day as and has not weighed. Will weight and document her weights. No acute problems reported as pt without complaints and is having a "good day". Based upon the assessment for today and discussion on her plan of care with adjusted interventions. Will make a referral for a health coach to continue working with this pt on her HF.  THN CM Care Plan Problem One     Most Recent Value  Care Plan Problem One  Knowledge Deficit in managing her CHF   Role Documenting the Problem One  Care Management Coordinator  Care Plan for Problem One  Active  THN Long Term Goal   Pt will have an increase knowledge base on how to manage her CHF over the next 90 days  THN Long Term Goal Start Date  05/15/18  Interventions for Problem One Long Term Goal  Reports much improvement with her HF symptoms however pt has not weighed today but reports her weights remain the same. Will strongly encouraged adherence and extend to allow Health Coach to intervene with ongoing monitoring on pt's HF. Will stress once again the improtance of daily weights to monitor fluid retention related to HF and the risk involved if not monitored..  THN CM Short Term Goal #3  Bilateral edema will reduce within the next 30 days  THN CM Short Term Goal #3 Start Date  07/16/18  [facility] CM Short Term Goal #3 Met Date  09/09/18      [name], RN Care Management Coordinator [place] Office [phone]

## 2018-09-20 ENCOUNTER — Other Ambulatory Visit: Payer: Self-pay | Admitting: *Deleted

## 2018-09-23 DIAGNOSIS — I10 Essential (primary) hypertension: Secondary | ICD-10-CM | POA: Diagnosis not present

## 2018-09-23 DIAGNOSIS — I509 Heart failure, unspecified: Secondary | ICD-10-CM | POA: Diagnosis not present

## 2018-09-23 DIAGNOSIS — J449 Chronic obstructive pulmonary disease, unspecified: Secondary | ICD-10-CM | POA: Diagnosis not present

## 2018-09-23 DIAGNOSIS — J45909 Unspecified asthma, uncomplicated: Secondary | ICD-10-CM | POA: Diagnosis not present

## 2018-09-23 DIAGNOSIS — G4733 Obstructive sleep apnea (adult) (pediatric): Secondary | ICD-10-CM | POA: Diagnosis not present

## 2018-09-23 DIAGNOSIS — E785 Hyperlipidemia, unspecified: Secondary | ICD-10-CM | POA: Diagnosis not present

## 2018-09-27 ENCOUNTER — Ambulatory Visit (INDEPENDENT_AMBULATORY_CARE_PROVIDER_SITE_OTHER): Payer: Medicare HMO

## 2018-09-27 DIAGNOSIS — Z5181 Encounter for therapeutic drug level monitoring: Secondary | ICD-10-CM | POA: Diagnosis not present

## 2018-09-27 DIAGNOSIS — Z950 Presence of cardiac pacemaker: Secondary | ICD-10-CM | POA: Diagnosis not present

## 2018-09-27 DIAGNOSIS — I471 Supraventricular tachycardia: Secondary | ICD-10-CM | POA: Diagnosis not present

## 2018-09-27 LAB — POCT INR: INR: 2.1 (ref 2.0–3.0)

## 2018-09-27 NOTE — Patient Instructions (Signed)
Description   Continue on same dosage 1.5 tablets every day except 2 tablets on Sundays and Thursdays. Recheck in 3 weeks. Continue eating your leafy green vegetables 2 servings each week.  Call Coumadin Clinic with any new or different medications or procedures #  [phone] Pt using pill box now.

## 2018-10-10 ENCOUNTER — Other Ambulatory Visit: Payer: Self-pay | Admitting: *Deleted

## 2018-10-10 NOTE — Patient Outreach (Signed)
[facility]) Care Management  10/10/2018  [name] Dec 06, 1940 [id number]   RN Health Coach Initial Assessment  Referral Date:  09/16/2018 Referral Source:  Transfer from [place] Reason for Referral:  Continued Disease Management Education Insurance:  [facility] Medicare   Outreach Attempt:  Outreach attempt #1 to patient for introduction and initial telephone assessment.  Patient answered and verified HIPAA.  RN Health  Coach introduced self and role.  Patient verbally agrees to Disease Management outreaches.  Patient stating she is unable to complete initial telephone assessment at this time and requesting a telephone call back.  Plan:  RN Health Coach will make another outreach attempt within the month of March.   [place] [phone] [email or web address] [email or web address]

## 2018-10-23 ENCOUNTER — Other Ambulatory Visit: Payer: Self-pay | Admitting: *Deleted

## 2018-10-23 NOTE — Patient Outreach (Signed)
[place] [facility]) Care Management  10/23/2018  Happy Begeman 1940-12-18 [id number]   RN Health Coach Initial Assessment  Referral Date:  09/16/2018 Referral Source:  Transfer from [place] Reason for Referral:  Continued Disease Management Education Insurance:  [facility] Medicare   Outreach Attempt:  Outreach attempt #2 to patient for initial telephone assessment.  Patient answered and verified HIPAA.  Patient stating her telephone is going dead and request a telephone call back.   Plan:  RN Health Coach will make next outreach appointment in the month of April if no return call back.  [place] Coach [phone] [email or web address]

## 2018-10-30 ENCOUNTER — Telehealth: Payer: Self-pay | Admitting: Pharmacist

## 2018-10-30 NOTE — Telephone Encounter (Signed)

## 2018-10-31 ENCOUNTER — Telehealth: Payer: Self-pay | Admitting: Pharmacist

## 2018-10-31 NOTE — Telephone Encounter (Signed)
Patient states she would be interested in home INR monitoring. I have submitted an application to [facility]. Let patient know to answer phone as they will be calling to set up billing/time to train her. Offered to push patients INR check out some in anticipation of getting home testing set up and hopefully avoiding the need for her to come out, but patient would like to keep appointment.

## 2018-11-13 ENCOUNTER — Other Ambulatory Visit: Payer: Self-pay | Admitting: *Deleted

## 2018-11-13 NOTE — Patient Outreach (Signed)
[facility]) Care Management  11/13/2018  Sharlett Lienemann 11/17/1940 [id number]   RN Health Coach Initial Assessment  Referral Date:09/16/2018 Referral Source:Transfer from [place] Reason for Referral:Continued Disease Management Education Insurance:[employer] Medicare   Outreach Attempt:  Outreach attempt #3 to patient for initial telephone assessment. No answer and unable to leave voicemail message due to voicemail did not engage.  Plan:  RN Health Coach will make another outreach attempt within the month of May if no call back from patient.  RN Health Coach will send patient Unsuccessful Letter.  [place] Coach [phone] [email or web address]

## 2018-11-14 ENCOUNTER — Ambulatory Visit (INDEPENDENT_AMBULATORY_CARE_PROVIDER_SITE_OTHER): Payer: Medicare HMO | Admitting: Cardiology

## 2018-11-14 DIAGNOSIS — Z950 Presence of cardiac pacemaker: Secondary | ICD-10-CM

## 2018-11-14 DIAGNOSIS — Z5181 Encounter for therapeutic drug level monitoring: Secondary | ICD-10-CM

## 2018-11-14 DIAGNOSIS — I471 Supraventricular tachycardia: Secondary | ICD-10-CM

## 2018-11-14 DIAGNOSIS — Z7901 Long term (current) use of anticoagulants: Secondary | ICD-10-CM | POA: Diagnosis not present

## 2018-11-14 LAB — POCT INR: INR: 1.4 — AB (ref 2.0–3.0)

## 2018-11-27 ENCOUNTER — Ambulatory Visit (INDEPENDENT_AMBULATORY_CARE_PROVIDER_SITE_OTHER): Payer: Medicare HMO | Admitting: Pharmacist Clinician (PhC)/ Clinical Pharmacy Specialist

## 2018-11-27 DIAGNOSIS — I471 Supraventricular tachycardia, unspecified: Secondary | ICD-10-CM

## 2018-11-27 DIAGNOSIS — Z5181 Encounter for therapeutic drug level monitoring: Secondary | ICD-10-CM | POA: Diagnosis not present

## 2018-11-27 DIAGNOSIS — Z950 Presence of cardiac pacemaker: Secondary | ICD-10-CM

## 2018-11-27 LAB — POCT INR: INR: 1.1 — AB (ref 2.0–3.0)

## 2018-12-03 ENCOUNTER — Ambulatory Visit: Payer: Medicare HMO | Admitting: Pulmonary Disease

## 2018-12-05 ENCOUNTER — Telehealth: Payer: Self-pay | Admitting: Pharmacist

## 2018-12-05 ENCOUNTER — Ambulatory Visit (INDEPENDENT_AMBULATORY_CARE_PROVIDER_SITE_OTHER): Payer: Medicare HMO | Admitting: Pharmacist

## 2018-12-05 DIAGNOSIS — Z950 Presence of cardiac pacemaker: Secondary | ICD-10-CM

## 2018-12-05 DIAGNOSIS — I471 Supraventricular tachycardia, unspecified: Secondary | ICD-10-CM

## 2018-12-05 DIAGNOSIS — Z5181 Encounter for therapeutic drug level monitoring: Secondary | ICD-10-CM

## 2018-12-05 LAB — POCT INR: INR: 2.2 (ref 2.0–3.0)

## 2018-12-05 NOTE — Telephone Encounter (Signed)
Called pt to remind her to do INR and she has just checked her INR. See anticoag encounter for details.

## 2018-12-13 ENCOUNTER — Other Ambulatory Visit: Payer: Self-pay | Admitting: *Deleted

## 2018-12-13 NOTE — Patient Outreach (Signed)
[facility]) Care Management  12/13/2018  [name] September 12, 1940 [id number]   RN Health Coach Initial Assessment  Referral Date:09/16/2018 Referral Source:Transfer from [place] Reason for Referral:Continued Disease Management Education Insurance:[employer] Medicare   Outreach Attempt:  Outreach attempt #4 to patient for initial telephone assessment.  Patient answered and verified HIPAA.  States she is unable to complete initial telephone assessment at this time and requested a telephone call back.   Plan:  RN Health Coach will make another outreach attempt within the month of May.  [place] [phone] [name]

## 2018-12-17 ENCOUNTER — Ambulatory Visit (INDEPENDENT_AMBULATORY_CARE_PROVIDER_SITE_OTHER): Payer: Medicare HMO | Admitting: Pharmacist

## 2018-12-17 DIAGNOSIS — Z950 Presence of cardiac pacemaker: Secondary | ICD-10-CM | POA: Diagnosis not present

## 2018-12-17 DIAGNOSIS — I471 Supraventricular tachycardia: Secondary | ICD-10-CM | POA: Diagnosis not present

## 2018-12-17 DIAGNOSIS — Z5181 Encounter for therapeutic drug level monitoring: Secondary | ICD-10-CM | POA: Diagnosis not present

## 2018-12-17 DIAGNOSIS — Z7901 Long term (current) use of anticoagulants: Secondary | ICD-10-CM | POA: Diagnosis not present

## 2018-12-17 LAB — POCT INR: INR: 2.5 (ref 2.0–3.0)

## 2018-12-23 ENCOUNTER — Other Ambulatory Visit: Payer: Self-pay | Admitting: *Deleted

## 2018-12-23 NOTE — Patient Outreach (Signed)
[place] [facility]) Care Management  12/23/2018  [name] [birth year] [id number]   RN Health Coach Initial Assessment  Referral Date:09/16/2018 Referral Source:Transfer from [place] Reason for Referral:Continued Disease Management Education Insurance:[employer] Medicare   Outreach Attempt:  Outreach attempt #5 to patient for initial telephone assessment.  Patient answered and verified HIPAA.  Voice is hoarse and patient stating she does not feel well.  States her voice started to become hoarse this morning.  Denies any fever (that she is aware of) and denies any cough, or any recent sick days.  Does report shortness of breath at the time of call due to increased activity, walking throughout the home at time of call; but denies increase in shortness of breath normally.  Reviewed signs and symptoms of COVID 19 and encouraged patient to contact provider if she develops symptoms or voice gets worse or if she starts to feel worse.  Patient stated her understanding and requested a call back at later date and time for completion of initial telephone assessment.   Plan:  RN Health Coach will make another telephone outreach to patient to complete initial telephone assessment within the month of June.  [place] [phone] [name]

## 2019-01-01 ENCOUNTER — Encounter: Payer: Self-pay | Admitting: Pharmacist

## 2019-01-01 ENCOUNTER — Telehealth: Payer: Self-pay | Admitting: Pharmacist

## 2019-01-01 NOTE — Telephone Encounter (Signed)
Pt due for INR on 5/19 and have not received.   Unable to leave message on either line will try again later.

## 2019-01-01 NOTE — Telephone Encounter (Signed)
This encounter was created in error - please disregard.

## 2019-01-01 NOTE — Telephone Encounter (Signed)
Called the pt to remind her that INR is overdue and the lady that answered [phone] stated that it was the wrong number and remove from the list.   Called the [phone] and pt answered and she stated she don't have anyone to help her with sticking her finger and she can't do it herself bc she shakes and can't do it herself. She states she can't get NOBODY to do it at all. Discussed with her about setting up her appt in the office and she is willing to come back in the office bc she is unable to find anyone to help. She will call her brother to bring her to her appt tomorrow bc she don't drive and if not she will call back to [phone] so we can r/s. Pt is aware that if she is not going to self test she will need to send machine back to company.  1. COVID-19 Pre-Screening Questions:  . In the past 7 to 10 days have you had a cough,  shortness of breath, headache, congestion, fever (100 or greater) body aches, chills, sore throat, or sudden loss of taste or sense of smell? NO . Have you been around anyone with known Covid 19? NO . Have you been around anyone who is awaiting Covid 19 test results in the past 7 to 10 days? NO . Have you been around anyone who has been exposed to Covid 19, or has mentioned symptoms of Covid 19 within the past 7 to 10 days?NO   2. Pt advised of visitor restrictions (no visitors allowed except if needed to conduct the visit). Also advised to arrive at appointment time and wear a mask.

## 2019-01-02 ENCOUNTER — Ambulatory Visit (INDEPENDENT_AMBULATORY_CARE_PROVIDER_SITE_OTHER): Payer: Medicare HMO

## 2019-01-02 ENCOUNTER — Other Ambulatory Visit: Payer: Self-pay

## 2019-01-02 DIAGNOSIS — Z5181 Encounter for therapeutic drug level monitoring: Secondary | ICD-10-CM

## 2019-01-02 DIAGNOSIS — I471 Supraventricular tachycardia: Secondary | ICD-10-CM | POA: Diagnosis not present

## 2019-01-02 DIAGNOSIS — Z950 Presence of cardiac pacemaker: Secondary | ICD-10-CM

## 2019-01-02 LAB — POCT INR: INR: 1.6 — AB (ref 2.0–3.0)

## 2019-01-02 NOTE — Patient Instructions (Signed)
Description   Called spoke with pt, advised to take 9mg  today, then take 6mg  tomorrow, then resume same dosage 4.5 mg daily except 6 mg each Sundays and Thursdays.  Repeat INR in 2 weeks.  Pt now has home meter with [facility], cannot stick herself.

## 2019-01-09 ENCOUNTER — Telehealth: Payer: Self-pay

## 2019-01-09 NOTE — Telephone Encounter (Signed)

## 2019-01-17 ENCOUNTER — Other Ambulatory Visit: Payer: Self-pay | Admitting: *Deleted

## 2019-01-17 NOTE — Patient Outreach (Signed)
[place] [facility]) Care Management  01/17/2019  Nimah Uphoff 07/12/1941 [id number]   RN Health Coach Initial Assessment  Referral Date:09/16/2018 Referral Source:Transfer from [place] Reason for Referral:Continued Disease Management Education Insurance:[employer] Medicare   Outreach Attempt:  Outreach attempt #6 to patient for initial assessment.  Patient answered and verified HIPAA.  Does states she does not have the time at the moment to complete initial telephone assessment.   Plan:  RN Health Coach will attempt another outreach within the month of July.  [place] [phone] [email or web address] [email or web address]

## 2019-02-05 ENCOUNTER — Encounter (INDEPENDENT_AMBULATORY_CARE_PROVIDER_SITE_OTHER): Payer: Medicare HMO | Admitting: Ophthalmology

## 2019-02-19 ENCOUNTER — Other Ambulatory Visit: Payer: Self-pay | Admitting: *Deleted

## 2019-02-19 ENCOUNTER — Encounter (INDEPENDENT_AMBULATORY_CARE_PROVIDER_SITE_OTHER): Payer: Medicare HMO | Admitting: Ophthalmology

## 2019-02-19 NOTE — Patient Outreach (Signed)
[place] [facility]) Care Management  02/19/2019  [name] [birth year] [id number]   RN Health Coach Initial Assessment  Referral Date:09/16/2018 Referral Source:Transfer from [place] Reason for Referral:Continued Disease Management Education Insurance:[employer] Medicare   Outreach Attempt:  Outreach attempt #7 to patient for initial telephone assessment.  Patient answered and stated she has had death in her family.  Patient very tearful and emotional.  Initially thinks I am calling from [facility] Providers office, apologising for missed appointment, stating she was unable to find the number to cancel the appointment.  RN Health Coach reintroduced self and stated reason for call.  Offered condolences and provided patient with telephone number to Dr. Dairl Ponder office [facility] Provider).  RN Health Coach transferred patient to eye provider's office.  Plan: RN Health Coach will attempt another outreach to patient within the month of August.   Blue Ruggerio RN [place] [phone] [email or web address] [email or web address]

## 2019-02-25 NOTE — Progress Notes (Addendum)
[facility] Note  02/26/2019     CHIEF COMPLAINT Patient presents for Retina Evaluation   HISTORY OF PRESENT ILLNESS: [name] is a 78 y.o. female who presents to the clinic today for:   HPI    Retina Evaluation    In both eyes.  This started weeks ago.  Duration of weeks.  Associated Symptoms Floaters and Flashes.  Context:  distance vision.  I, the attending physician,  performed the HPI with the patient and updated documentation appropriately.          Comments    New patient ref by Dr. Melina Fiddler for possible AMD and ?BRAO Patient states since cataract surgery in 2019, her vision has been great and only wears glasses for reading--she has not gotten new glasses since cataract surgery but wants an updated Rx.  Patient denies any eye pain or discomfort.  She complains of occasional floaters and flashes of light OU.       Last edited by Bernarda Caffey, MD on 02/26/2019 10:20 AM. (History)     Patient states vision doing well OU. Uses glasses for reading only.   Referring physician: Calton Dach, MD [place],  [place] [id number]  HISTORICAL INFORMATION:   Selected notes from the MEDICAL RECORD NUMBER Referred by Dr. Thurston Hole for concern of ARMD LEE: 12.20.19 Chauncey Cruel. [place]) [BCVA: OD: 20/20-2, OS: 20/25] Ocular Hx- PMH-   CURRENT MEDICATIONS: Current Outpatient Medications (Ophthalmic Drugs)  Medication Sig  . Bromfenac Sodium (PROLENSA) 0.07 % SOLN Apply to eye as directed.  Marland Kitchen Propylene Glycol-Glycerin (SOOTHE) 0.6-0.6 % SOLN Apply to eye as directed.   No current facility-administered medications for this visit.  (Ophthalmic Drugs)   Current Outpatient Medications (Other)  Medication Sig  . budesonide-formoterol (SYMBICORT) 80-4.5 MCG/ACT inhaler Inhale 2 puffs into the lungs 2 (two) times daily.  . furosemide (LASIX) 40 MG tablet Take 40 mg by mouth 2 (two) times daily. Patient takes 2 pills once a day  around 1000  . levalbuterol (XOPENEX HFA) 45 MCG/ACT inhaler Inhale 2 puffs into the lungs every 4 (four) hours as needed for wheezing.  . montelukast (SINGULAIR) 10 MG tablet TAKE 1 TABLET BY MOUTH AT BEDTIME EVERY NIGHT  . omeprazole (PRILOSEC) 20 MG capsule Take 1 capsule (20 mg total) by mouth daily. Take 30-60 min before first meal of the day  . spironolactone (ALDACTONE) 25 MG tablet TAKE ONE TABLET BY MOUTH ONCE DAILY  . valsartan (DIOVAN) 160 MG tablet Take 160 mg by mouth daily.  Marland Kitchen warfarin (COUMADIN) 3 MG tablet TAKE 1 & 1/2-2 TABLETS BY MOUTH DAILY OR AS DIRECTED BY COUMADIN CLINIC   No current facility-administered medications for this visit.  (Other)      REVIEW OF SYSTEMS: ROS    Positive for: Eyes   Negative for: Constitutional, Gastrointestinal, Neurological, Skin, Genitourinary, Musculoskeletal, HENT, Endocrine, Cardiovascular, Respiratory, Psychiatric, Allergic/Imm, Heme/Lymph   Last edited by Doneen Poisson on 02/26/2019  9:58 AM. (History)       ALLERGIES Allergies  Allergen Reactions  . Albuterol Other (See Comments)    irregular heartbeat    PAST MEDICAL HISTORY Past Medical History:  Diagnosis Date  . Allergic rhinitis   . Anemia    hx due to med  . Arthritis   . Asthma   . CHF (congestive heart failure) ([place])   . Emphysema of lung ([place])   . Hyperlipidemia   . Hypertension   .  Pacemaker   . PAT (paroxysmal atrial tachycardia) ([place]) 2008  . Pneumonia    hx  . 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 ABDOMINAL HYSTERECTOMY    . BREAST BIOPSY  04/30/2012   Procedure: BREAST BIOPSY WITH NEEDLE LOCALIZATION;  Surgeon: Rolm Bookbinder, MD;  Location: [facility];  Service: General;  Laterality: Left;  Left breast wire localization biospy  . HEMORRHOID SURGERY    . PACEMAKER INSERTION  02-06-2011  . TAH and BSO    . TONSILLECTOMY      FAMILY HISTORY Family History  Problem  Relation Age of Onset  . Heart disease Mother   . Cancer Sister        breast    SOCIAL HISTORY Social History   Tobacco Use  . Smoking status: Former Smoker    Packs/day: 1.00    Years: 4.00    Pack years: 4.00    Types: Cigarettes    Quit date: 08/07/1977    Years since quitting: 41.5  . Smokeless tobacco: Never Used  Substance Use Topics  . Alcohol use: No    Alcohol/week: 0.0 standard drinks    Comment: quit 1979  . Drug use: No         OPHTHALMIC EXAM:  Base Eye Exam    Visual Acuity (Snellen - Linear)      Right Left   Dist [place] 20/20 20/50 -1   Dist ph [place]  20/20 -1       Tonometry (Tonopen, 9:54 AM)      Right Left   Pressure 17 16       Pupils      Dark Light Shape React APD   Right 4 3 Round Brisk 0   Left 4 3 Round Brisk 0       Visual Fields      Left Right    Full Full       Extraocular Movement      Right Left    Full Full       Neuro/Psych    Oriented x3: Yes   Mood/Affect: Normal       Dilation    Both eyes: 1.0% Mydriacyl, 2.5% Phenylephrine @ 9:54 AM        Slit Lamp and Fundus Exam    Slit Lamp Exam      Right Left   Lids/Lashes Dermatochalasis - upper lid, missing lashes central upper lid missing lashes central upper lid, , Dermatochalasis - upper lid   Conjunctiva/Sclera mild inferior conjchalasis, mild melanosis mild inferior conjchalasis, mild melanosis   Cornea 1+, Punctate epithelial erosions, tear film debris, mild arcus 1+, Punctate epithelial erosions, tear film debris, mild arcus   Anterior Chamber deep and clear deep and clear   Iris round and dilated round and dilated   Lens PCIOL in good position PCIOL in good position   Vitreous syneresis syneresis, PVD       Fundus Exam      Right Left   Disc pink and sharp, +SVP pink and sharp   C/D Ratio 0.5 0.5   Macula flat, blunted foveal reflex, RPE mottling and clumping, +drusen, no heme or edema Blunted foveal reflex, RPE mottling, no heme or edema   Vessels  Vascular attenuation, mild copper wiring, + A/V crossing changes Vascular attenuation, mild copper wiring, + A/V crossing changes   Periphery attached, no heme, no  RT/RD attached, no heme, no RT/RD, focal cluster of drusen/RPE changes at 0800 mid zone        Refraction    Wearing Rx   Has glasses from prior to cataract surgery but only wears while reading       Manifest Refraction      Sphere Cylinder Axis Dist VA   Right -0.25 Sphere  20/20   Left -0.75 +2.00 160 20/20          IMAGING AND PROCEDURES  Imaging and Procedures for @TODAY @  OCT, Retina - OU - Both Eyes       Right Eye Quality was good. Central Foveal Thickness: 241. Progression has no prior data. Findings include normal observations, normal foveal contour, no IRF, no SRF, retinal drusen  (Rare drusen).   Left Eye Quality was good. Central Foveal Thickness: 245. Progression has no prior data. Findings include normal foveal contour, no IRF, no SRF, retinal drusen  (Rare drusen).   Notes *Images captured and stored on drive  Diagnosis / Impression:  OD: NFP, no IRF/SRF, +drusen OS: NFP, no IRF/SRF, +drusen   Clinical management:  See below  Abbreviations: NFP - Normal foveal profile. CME - cystoid macular edema. PED - pigment epithelial detachment. IRF - intraretinal fluid. SRF - subretinal fluid. EZ - ellipsoid zone. ERM - epiretinal membrane. ORA - outer retinal atrophy. ORT - outer retinal tubulation. SRHM - subretinal hyper-reflective material                 ASSESSMENT/PLAN:    ICD-10-CM   1. Early dry stage nonexudative age-related macular degeneration of both eyes  H35.3131   2. Retinal edema  H35.81 OCT, Retina - OU - Both Eyes  3. Essential hypertension  I10   4. Hypertensive retinopathy of both eyes  H35.033   5. Pseudophakia of both eyes  Z96.1     1. Age related macular degeneration, non-exudative, both eyes  - very mild drusen OU --> early stage  - The incidence, anatomy, and  pathology of dry AMD, risk of progression, and the AREDS and AREDS 2 study including smoking risks discussed with patient.  - Recommend amsler grid monitoring  - f/u 1 year  2. No retinal edema on exam or OCT  3,4. Hypertensive retinopathy OU - discussed importance of tight BP control - monitor  5. Pseudophakia OU  - s/p CE/IOL OU (2017, Dr. Talbert [place])  - s/p YAG capsulotomy OU (2018)  - beautiful surgeries, doing well  - has not updated glasses since cataract surgery  - recommend return to Dr. Shirley Muscat for updated MRx   Ophthalmic Meds Ordered this visit:  No orders of the defined types were placed in this encounter.      Return 12 months, for DFE, OCT.  There are no Patient Instructions on file for this visit.   Explained the diagnoses, plan, and follow up with the patient and they expressed understanding.  Patient expressed understanding of the importance of proper follow up care.   This document serves as a record of services personally performed by Gardiner Sleeper, MD, PhD. It was created on their behalf by Ernest Mallick, OA, an ophthalmic assistant. The creation of this record is the provider's dictation and/or activities during the visit.    Electronically signed by: Ernest Mallick, OA  07.21.2020 11:31 AM    Gardiner Sleeper, M.D., Ph.D. Diseases & Surgery of the Retina and Vitreous [facility]  I have reviewed the  above documentation for accuracy and completeness, and I agree with the above. Gardiner Sleeper, M.D., Ph.D. 02/26/19 11:31 AM    Abbreviations: M myopia (nearsighted); A astigmatism; H hyperopia (farsighted); P presbyopia; Mrx spectacle prescription;  CTL contact lenses; OD right eye; OS left eye; OU both eyes  XT exotropia; ET esotropia; PEK punctate epithelial keratitis; PEE punctate epithelial erosions; DES dry eye syndrome; MGD meibomian gland dysfunction; ATs artificial tears; PFAT's preservative free artificial tears; [place] nuclear  sclerotic cataract; PSC posterior subcapsular cataract; ERM epi-retinal membrane; PVD posterior vitreous detachment; RD retinal detachment; DM diabetes mellitus; DR diabetic retinopathy; NPDR non-proliferative diabetic retinopathy; PDR proliferative diabetic retinopathy; CSME clinically significant macular edema; DME diabetic macular edema; dbh dot blot hemorrhages; CWS cotton wool spot; POAG primary open angle glaucoma; C/D cup-to-disc ratio; HVF humphrey visual field; GVF goldmann visual field; OCT optical coherence tomography; IOP intraocular pressure; BRVO Branch retinal vein occlusion; CRVO central retinal vein occlusion; CRAO central retinal artery occlusion; BRAO branch retinal artery occlusion; RT retinal tear; SB scleral buckle; PPV pars plana vitrectomy; VH Vitreous hemorrhage; PRP panretinal laser photocoagulation; IVK intravitreal kenalog; VMT vitreomacular traction; MH Macular hole;  NVD neovascularization of the disc; NVE neovascularization elsewhere; AREDS age related eye disease study; ARMD age related macular degeneration; POAG primary open angle glaucoma; EBMD epithelial/anterior basement membrane dystrophy; ACIOL anterior chamber intraocular lens; IOL intraocular lens; PCIOL posterior chamber intraocular lens; Phaco/IOL phacoemulsification with intraocular lens placement; [place] photorefractive keratectomy; LASIK laser assisted in situ keratomileusis; HTN hypertension; DM diabetes mellitus; COPD chronic obstructive pulmonary disease

## 2019-02-26 ENCOUNTER — Other Ambulatory Visit: Payer: Self-pay

## 2019-02-26 ENCOUNTER — Encounter (INDEPENDENT_AMBULATORY_CARE_PROVIDER_SITE_OTHER): Payer: Self-pay | Admitting: Ophthalmology

## 2019-02-26 ENCOUNTER — Ambulatory Visit (INDEPENDENT_AMBULATORY_CARE_PROVIDER_SITE_OTHER): Payer: Medicare HMO | Admitting: Ophthalmology

## 2019-02-26 DIAGNOSIS — H3581 Retinal edema: Secondary | ICD-10-CM

## 2019-02-26 DIAGNOSIS — H35033 Hypertensive retinopathy, bilateral: Secondary | ICD-10-CM

## 2019-02-26 DIAGNOSIS — H353131 Nonexudative age-related macular degeneration, bilateral, early dry stage: Secondary | ICD-10-CM

## 2019-02-26 DIAGNOSIS — Z961 Presence of intraocular lens: Secondary | ICD-10-CM

## 2019-02-26 DIAGNOSIS — I10 Essential (primary) hypertension: Secondary | ICD-10-CM | POA: Diagnosis not present

## 2019-03-04 DIAGNOSIS — H0102A Squamous blepharitis right eye, upper and lower eyelids: Secondary | ICD-10-CM | POA: Diagnosis not present

## 2019-03-04 DIAGNOSIS — Z961 Presence of intraocular lens: Secondary | ICD-10-CM | POA: Diagnosis not present

## 2019-03-04 DIAGNOSIS — H11153 Pinguecula, bilateral: Secondary | ICD-10-CM | POA: Diagnosis not present

## 2019-03-04 DIAGNOSIS — H18413 Arcus senilis, bilateral: Secondary | ICD-10-CM | POA: Diagnosis not present

## 2019-03-04 DIAGNOSIS — H40023 Open angle with borderline findings, high risk, bilateral: Secondary | ICD-10-CM | POA: Diagnosis not present

## 2019-03-04 DIAGNOSIS — H353131 Nonexudative age-related macular degeneration, bilateral, early dry stage: Secondary | ICD-10-CM | POA: Diagnosis not present

## 2019-03-04 DIAGNOSIS — H0102B Squamous blepharitis left eye, upper and lower eyelids: Secondary | ICD-10-CM | POA: Diagnosis not present

## 2019-03-04 DIAGNOSIS — H04123 Dry eye syndrome of bilateral lacrimal glands: Secondary | ICD-10-CM | POA: Diagnosis not present

## 2019-03-07 ENCOUNTER — Ambulatory Visit (INDEPENDENT_AMBULATORY_CARE_PROVIDER_SITE_OTHER): Payer: Medicare HMO | Admitting: Internal Medicine

## 2019-03-07 DIAGNOSIS — Z950 Presence of cardiac pacemaker: Secondary | ICD-10-CM | POA: Diagnosis not present

## 2019-03-07 DIAGNOSIS — I471 Supraventricular tachycardia: Secondary | ICD-10-CM

## 2019-03-07 DIAGNOSIS — Z5181 Encounter for therapeutic drug level monitoring: Secondary | ICD-10-CM | POA: Diagnosis not present

## 2019-03-07 LAB — POCT INR: INR: 1.4 — AB (ref 2.0–3.0)

## 2019-03-07 NOTE — Patient Instructions (Signed)
Description   Called spoke with pt, advised to take 2 tablets today and tomorrow, then resume same dosage 1.5 tablets daily except 2 tablets on Sundays and Thursdays.  Repeat INR in 1 week.  Pt now has home meter with MDINR, cannot stick herself.

## 2019-03-10 DIAGNOSIS — Z961 Presence of intraocular lens: Secondary | ICD-10-CM | POA: Diagnosis not present

## 2019-03-20 DIAGNOSIS — R0602 Shortness of breath: Secondary | ICD-10-CM | POA: Diagnosis not present

## 2019-03-20 DIAGNOSIS — E6609 Other obesity due to excess calories: Secondary | ICD-10-CM | POA: Diagnosis not present

## 2019-03-20 DIAGNOSIS — I10 Essential (primary) hypertension: Secondary | ICD-10-CM | POA: Diagnosis not present

## 2019-03-20 DIAGNOSIS — R072 Precordial pain: Secondary | ICD-10-CM | POA: Diagnosis not present

## 2019-03-21 ENCOUNTER — Other Ambulatory Visit: Payer: Self-pay | Admitting: *Deleted

## 2019-03-21 NOTE — Patient Outreach (Signed)
[facility]) Care Management  03/21/2019  [name] 1941/07/12 [id number]   RN Health Coach Initial Assessment  Referral Date:09/16/2018 Referral Source:Transfer from [place] Reason for Referral:Continued Disease Management Education Insurance:[employer]   Outreach Attempt:  Outreach attempt #8 to patient for initial telephone assessment. No answer and unable to leave voicemail message due to voicemail box being full.  Plan:  RN Health Coach will send Unsuccessful Letter.  RN Health Coach will close case if no response from patient within 10 business days of mailing Unsuccessful Letter.  [name] [phone] [name]

## 2019-04-02 DIAGNOSIS — R0602 Shortness of breath: Secondary | ICD-10-CM | POA: Diagnosis not present

## 2019-04-02 DIAGNOSIS — I361 Nonrheumatic tricuspid (valve) insufficiency: Secondary | ICD-10-CM | POA: Diagnosis not present

## 2019-04-02 DIAGNOSIS — I340 Nonrheumatic mitral (valve) insufficiency: Secondary | ICD-10-CM | POA: Diagnosis not present

## 2019-04-02 DIAGNOSIS — R072 Precordial pain: Secondary | ICD-10-CM | POA: Diagnosis not present

## 2019-04-08 ENCOUNTER — Other Ambulatory Visit: Payer: Self-pay | Admitting: *Deleted

## 2019-04-08 NOTE — Patient Outreach (Signed)
[facility]) Care Management  04/08/2019  [name] 09-13-1940 [id number]   RN Health Coach Case Closure  Referral Date:09/16/2018 Referral Source:Transfer from [place] Reason for Referral:Continued Disease Management Education Insurance:[employer]   Outreach Attempt:  Multiple attempts to establish contact with patient without success. No response from letter mailed to patient. Case is being closed at this time.   Plan:  RN Health Coach will close case and make inactive with [facility] due to inability to maintain contact with patient and primary care provider not in-network with [facility].  RN Health Coach will send primary care provider Case Closure letter.  RN Health Coach will send patient Case Closure Letter.  [place] [phone] Marcea [email or web address]

## 2019-05-20 ENCOUNTER — Telehealth: Payer: Self-pay | Admitting: Internal Medicine

## 2019-05-20 NOTE — Telephone Encounter (Signed)
New message   Per Manuela Schwartz need to know if INR is being done in the office. The patient has a meter at her home and has not had a check done at her home since March 07, 2019. Please call to discuss.

## 2019-05-20 NOTE — Telephone Encounter (Signed)
Spoke with patient who states she doesn't have transportation to come of office for visit. She claims that they "lady from [place]" couldn't ever get the blood on the test strip and never really taught her how to do it. Says that she doesn't have anyone to help her check it. States that if someone were to come back out to retrain her that she would be willing to try herself.  Called and left susan a message to call back

## 2019-05-21 NOTE — Telephone Encounter (Signed)
Spoke with susan from [place]. She states that they have been out to train the patient atleast 3 times. They did get a result each time (we also have encounters for those times). Patient tells them that she cannot test and that they can come get the meter, but she tells me that she would be willing to retrain. Patient states that her family has car trouble and cannot help her. Manuela Schwartz states they can send someone out to retrain one more time. If patient still wont test then they need to take the meter back. Patient also states she cant come to clinic. If patient is not successful with home monitoring then we will need to reinforce that patient needs to be monitored in the clinic. May be able to reach out to social worker to see if she could help with transportation

## 2019-06-02 DIAGNOSIS — R0602 Shortness of breath: Secondary | ICD-10-CM | POA: Diagnosis not present

## 2019-06-02 DIAGNOSIS — I10 Essential (primary) hypertension: Secondary | ICD-10-CM | POA: Diagnosis not present

## 2019-06-02 DIAGNOSIS — E6609 Other obesity due to excess calories: Secondary | ICD-10-CM | POA: Diagnosis not present

## 2019-06-02 DIAGNOSIS — R072 Precordial pain: Secondary | ICD-10-CM | POA: Diagnosis not present

## 2019-06-03 ENCOUNTER — Inpatient Hospital Stay (HOSPITAL_COMMUNITY)
Admission: AD | Admit: 2019-06-03 | Discharge: 2019-06-05 | DRG: 292 | Disposition: A | Discharge status: Home / Self Care | Payer: Medicare HMO | Attending: Cardiovascular Disease | Admitting: Cardiovascular Disease

## 2019-06-03 DIAGNOSIS — Z87891 Personal history of nicotine dependence: Secondary | ICD-10-CM

## 2019-06-03 DIAGNOSIS — T881XXA Other complications following immunization, not elsewhere classified, initial encounter: Secondary | ICD-10-CM | POA: Diagnosis not present

## 2019-06-03 DIAGNOSIS — M1990 Unspecified osteoarthritis, unspecified site: Secondary | ICD-10-CM | POA: Diagnosis present

## 2019-06-03 DIAGNOSIS — Z6841 Body Mass Index (BMI) 40.0 and over, adult: Secondary | ICD-10-CM | POA: Diagnosis not present

## 2019-06-03 DIAGNOSIS — I5033 Acute on chronic diastolic (congestive) heart failure: Secondary | ICD-10-CM | POA: Diagnosis present

## 2019-06-03 DIAGNOSIS — Z20828 Contact with and (suspected) exposure to other viral communicable diseases: Secondary | ICD-10-CM | POA: Diagnosis not present

## 2019-06-03 DIAGNOSIS — J439 Emphysema, unspecified: Secondary | ICD-10-CM | POA: Diagnosis present

## 2019-06-03 DIAGNOSIS — R0602 Shortness of breath: Secondary | ICD-10-CM

## 2019-06-03 DIAGNOSIS — R079 Chest pain, unspecified: Secondary | ICD-10-CM | POA: Diagnosis not present

## 2019-06-03 DIAGNOSIS — I10 Essential (primary) hypertension: Secondary | ICD-10-CM | POA: Diagnosis present

## 2019-06-03 DIAGNOSIS — K219 Gastro-esophageal reflux disease without esophagitis: Secondary | ICD-10-CM | POA: Diagnosis present

## 2019-06-03 DIAGNOSIS — I480 Paroxysmal atrial fibrillation: Secondary | ICD-10-CM | POA: Diagnosis not present

## 2019-06-03 DIAGNOSIS — Z888 Allergy status to other drugs, medicaments and biological substances status: Secondary | ICD-10-CM

## 2019-06-03 DIAGNOSIS — J069 Acute upper respiratory infection, unspecified: Secondary | ICD-10-CM | POA: Diagnosis not present

## 2019-06-03 DIAGNOSIS — E785 Hyperlipidemia, unspecified: Secondary | ICD-10-CM | POA: Diagnosis present

## 2019-06-03 DIAGNOSIS — Z7951 Long term (current) use of inhaled steroids: Secondary | ICD-10-CM

## 2019-06-03 DIAGNOSIS — Z7901 Long term (current) use of anticoagulants: Secondary | ICD-10-CM | POA: Diagnosis not present

## 2019-06-03 DIAGNOSIS — M797 Fibromyalgia: Secondary | ICD-10-CM | POA: Diagnosis present

## 2019-06-03 DIAGNOSIS — I110 Hypertensive heart disease with heart failure: Secondary | ICD-10-CM | POA: Diagnosis not present

## 2019-06-03 DIAGNOSIS — Z8701 Personal history of pneumonia (recurrent): Secondary | ICD-10-CM

## 2019-06-03 DIAGNOSIS — J45909 Unspecified asthma, uncomplicated: Secondary | ICD-10-CM | POA: Diagnosis present

## 2019-06-03 DIAGNOSIS — Z79899 Other long term (current) drug therapy: Secondary | ICD-10-CM | POA: Diagnosis not present

## 2019-06-03 DIAGNOSIS — Z950 Presence of cardiac pacemaker: Secondary | ICD-10-CM

## 2019-06-03 DIAGNOSIS — I495 Sick sinus syndrome: Secondary | ICD-10-CM | POA: Diagnosis not present

## 2019-06-03 DIAGNOSIS — Z8249 Family history of ischemic heart disease and other diseases of the circulatory system: Secondary | ICD-10-CM

## 2019-06-03 DIAGNOSIS — I071 Rheumatic tricuspid insufficiency: Secondary | ICD-10-CM | POA: Diagnosis present

## 2019-06-03 DIAGNOSIS — I5031 Acute diastolic (congestive) heart failure: Secondary | ICD-10-CM | POA: Diagnosis present

## 2019-06-03 DIAGNOSIS — I5023 Acute on chronic systolic (congestive) heart failure: Secondary | ICD-10-CM | POA: Diagnosis not present

## 2019-06-03 LAB — COMPREHENSIVE METABOLIC PANEL
ALT: 14 U/L (ref 0–44)
AST: 28 U/L (ref 15–41)
Albumin: 3 g/dL — ABNORMAL LOW (ref 3.5–5.0)
Alkaline Phosphatase: 115 U/L (ref 38–126)
Anion gap: 8 (ref 5–15)
BUN: 14 mg/dL (ref 8–23)
CO2: 23 mmol/L (ref 22–32)
Calcium: 8.4 mg/dL — ABNORMAL LOW (ref 8.9–10.3)
Chloride: 106 mmol/L (ref 98–111)
Creatinine, Ser: 1.01 mg/dL — ABNORMAL HIGH (ref 0.44–1.00)
GFR calc Af Amer: >60 mL/min (ref >60)
GFR calc non Af Amer: 53 mL/min — ABNORMAL LOW (ref >60)
Glucose, Bld: 93 mg/dL (ref 70–99)
Potassium: 4.1 mmol/L (ref 3.5–5.1)
Sodium: 137 mmol/L (ref 135–145)
Total Bilirubin: 0.7 mg/dL (ref 0.3–1.2)
Total Protein: 6.7 g/dL (ref 6.5–8.1)

## 2019-06-03 LAB — BRAIN NATRIURETIC PEPTIDE: B Natriuretic Peptide: 38.5 pg/mL (ref 0.0–100.0)

## 2019-06-03 LAB — CBC WITH DIFFERENTIAL/PLATELET
Abs Immature Granulocytes: 0.02 10*3/uL (ref 0.00–0.07)
Basophils Absolute: 0.1 10*3/uL (ref 0.0–0.1)
Basophils Relative: 1 %
Eosinophils Absolute: 0.3 10*3/uL (ref 0.0–0.5)
Eosinophils Relative: 4 %
HCT: 36.6 % (ref 36.0–46.0)
Hemoglobin: 11.4 g/dL — ABNORMAL LOW (ref 12.0–15.0)
Immature Granulocytes: 0 %
Lymphocytes Relative: 34 %
Lymphs Abs: 2.7 10*3/uL (ref 0.7–4.0)
MCH: 28 pg (ref 26.0–34.0)
MCHC: 31.1 g/dL (ref 30.0–36.0)
MCV: 89.9 fL (ref 80.0–100.0)
Monocytes Absolute: 0.7 10*3/uL (ref 0.1–1.0)
Monocytes Relative: 8 %
Neutro Abs: 4.3 10*3/uL (ref 1.7–7.7)
Neutrophils Relative %: 53 %
Platelets: 260 10*3/uL (ref 150–400)
RBC: 4.07 MIL/uL (ref 3.87–5.11)
RDW: 14.7 % (ref 11.5–15.5)
WBC: 8 10*3/uL (ref 4.0–10.5)
nRBC: 0 % (ref 0.0–0.2)

## 2019-06-03 LAB — TSH: TSH: 1.719 u[IU]/mL (ref 0.350–4.500)

## 2019-06-03 LAB — PROTIME-INR
INR: 1.2 (ref 0.8–1.2)
Prothrombin Time: 15.3 seconds — ABNORMAL HIGH (ref 11.4–15.2)

## 2019-06-03 MED ORDER — MONTELUKAST SODIUM 10 MG PO TABS
10.0000 mg | ORAL_TABLET | Freq: Every day | ORAL | Status: DC
  Administered 2019-06-03 – 2019-06-04 (×2): 10 mg via ORAL
  Filled 2019-06-03 (×2): qty 1

## 2019-06-03 MED ORDER — WARFARIN SODIUM 2 MG PO TABS
4.0000 mg | ORAL_TABLET | Freq: Every day | ORAL | Status: DC
  Administered 2019-06-03 – 2019-06-05 (×3): 4 mg via ORAL
  Filled 2019-06-03 (×4): qty 2

## 2019-06-03 MED ORDER — SODIUM CHLORIDE 0.9% FLUSH
3.0000 mL | INTRAVENOUS | Status: DC | PRN
  Administered 2019-06-04: 3 mL via INTRAVENOUS
  Filled 2019-06-03: qty 3

## 2019-06-03 MED ORDER — PANTOPRAZOLE SODIUM 40 MG PO TBEC
40.0000 mg | DELAYED_RELEASE_TABLET | Freq: Every day | ORAL | Status: DC
  Administered 2019-06-03 – 2019-06-05 (×3): 40 mg via ORAL
  Filled 2019-06-03 (×3): qty 1

## 2019-06-03 MED ORDER — IRBESARTAN 300 MG PO TABS
150.0000 mg | ORAL_TABLET | Freq: Every day | ORAL | Status: DC
  Administered 2019-06-04 – 2019-06-05 (×2): 150 mg via ORAL
  Filled 2019-06-03 (×2): qty 1

## 2019-06-03 MED ORDER — ACETAMINOPHEN 325 MG PO TABS: 650.0000 mg | ORAL_TABLET | ORAL | Status: DC | PRN

## 2019-06-03 MED ORDER — WARFARIN - PHYSICIAN DOSING INPATIENT
Freq: Every day | Status: DC
  Administered 2019-06-05: 17:00:00

## 2019-06-03 MED ORDER — ONDANSETRON HCL 4 MG/2ML IJ SOLN: 4.0000 mg | Freq: Four times a day (QID) | INTRAMUSCULAR | Status: DC | PRN

## 2019-06-03 MED ORDER — SPIRONOLACTONE 25 MG PO TABS
25.0000 mg | ORAL_TABLET | Freq: Every day | ORAL | Status: DC
  Administered 2019-06-04 – 2019-06-05 (×2): 25 mg via ORAL
  Filled 2019-06-03 (×2): qty 1

## 2019-06-03 MED ORDER — FUROSEMIDE 10 MG/ML IJ SOLN
40.0000 mg | Freq: Two times a day (BID) | INTRAMUSCULAR | Status: DC
  Administered 2019-06-03 – 2019-06-05 (×4): 40 mg via INTRAVENOUS
  Filled 2019-06-03 (×4): qty 4

## 2019-06-03 MED ORDER — SODIUM CHLORIDE 0.9% FLUSH
3.0000 mL | Freq: Two times a day (BID) | INTRAVENOUS | Status: DC
  Administered 2019-06-03 – 2019-06-05 (×3): 3 mL via INTRAVENOUS

## 2019-06-03 MED ORDER — HEPARIN SODIUM (PORCINE) 5000 UNIT/ML IJ SOLN: 5000.0000 [IU] | Freq: Three times a day (TID) | INTRAMUSCULAR | Status: DC

## 2019-06-03 MED ORDER — SODIUM CHLORIDE 0.9 % IV SOLN: 250.0000 mL | INTRAVENOUS | Status: DC | PRN

## 2019-06-03 NOTE — H&P (Signed)
Referring Physician:  Pablo Ferko is an 78 y.o. female.                       Chief Complaint: Shortness of breath  HPI: 78 years old back female with PMH of paroxysmal atrial fibrillation, S/P pacemaker placement for sick sinus syndrome, hypertension, obesity, hyperlipidemia, Asthma, arthritis and diastolic heat failure has shortness of breath and bilateral leg edema with several pounds of weight gain. She denies fever or chills.   Past Medical History:  Diagnosis Date  . Allergic rhinitis   . Anemia    hx due to med  . Arthritis   . Asthma   . CHF (congestive heart failure) ([place])   . Emphysema of lung ([place])   . Hyperlipidemia   . Hypertension   . Pacemaker   . PAT (paroxysmal atrial tachycardia) ([place]) 2008  . Pneumonia    hx  . 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 ABDOMINAL HYSTERECTOMY    . BREAST BIOPSY  04/30/2012   Procedure: BREAST BIOPSY WITH NEEDLE LOCALIZATION;  Surgeon: Rolm Bookbinder, MD;  Location: [facility];  Service: General;  Laterality: Left;  Left breast wire localization biospy  . HEMORRHOID SURGERY    . PACEMAKER INSERTION  02-06-2011  . TAH and BSO    . TONSILLECTOMY      Family History  Problem Relation Age of Onset  . Heart disease Mother   . Cancer Sister        breast   Social History:  reports that she quit smoking about 41 years ago. Her smoking use included cigarettes. She has a 4.00 pack-year smoking history. She has never used smokeless tobacco. She reports that she does not drink alcohol or use drugs.  Allergies:  Allergies  Allergen Reactions  . Albuterol Other (See Comments)    irregular heartbeat    Medications Prior to Admission  Medication Sig Dispense Refill  . Bromfenac Sodium (PROLENSA) 0.07 % SOLN Apply to eye as directed.    . budesonide-formoterol (SYMBICORT) 80-4.5 MCG/ACT inhaler Inhale 2 puffs into the lungs 2 (two) times daily. 3 Inhaler  1  . furosemide (LASIX) 40 MG tablet Take 40 mg by mouth 2 (two) times daily. Patient takes 2 pills once a day around 1000    . levalbuterol (XOPENEX HFA) 45 MCG/ACT inhaler Inhale 2 puffs into the lungs every 4 (four) hours as needed for wheezing. 3 Inhaler 1  . montelukast (SINGULAIR) 10 MG tablet TAKE 1 TABLET BY MOUTH AT BEDTIME EVERY NIGHT 30 tablet 2  . omeprazole (PRILOSEC) 20 MG capsule Take 1 capsule (20 mg total) by mouth daily. Take 30-60 min before first meal of the day 30 capsule 5  . Propylene Glycol-Glycerin (SOOTHE) 0.6-0.6 % SOLN Apply to eye as directed.    Marland Kitchen spironolactone (ALDACTONE) 25 MG tablet TAKE ONE TABLET BY MOUTH ONCE DAILY 30 tablet 1  . valsartan (DIOVAN) 160 MG tablet Take 160 mg by mouth daily.    Marland Kitchen warfarin (COUMADIN) 3 MG tablet TAKE 1 & 1/2-2 TABLETS BY MOUTH DAILY OR AS DIRECTED BY COUMADIN CLINIC 150 tablet 0    No results found for this or any previous visit (from the past 73 hour(s)). No results found.  Review Of Systems Constitutional: No fever, chills, positive weight gain. Eyes: No vision change, wears glasses. No discharge  or pain. Ears: No hearing loss, No tinnitus. Respiratory: Positive asthma, COPD, positive pneumonias. Positive shortness of breath. No hemoptysis. Cardiovascular: Positive chest pain, palpitation, leg edema. Gastrointestinal: No nausea, vomiting, diarrhea, constipation. No GI bleed. No hepatitis. Genitourinary: No dysuria, hematuria, kidney stone. No incontinance. Neurological: Positive headache, no stroke or seizures.  Psychiatry: No psych facility admission for anxiety, depression, suicide. No detox. Skin: No rash. Musculoskeletal: Positive joint pain, fibromyalgia, neck pain, back pain. Lymphadenopathy: No lymphadenopathy. Hematology: No anemia or easy bruising.   There were no vitals taken for this visit. There is no height or weight on file to calculate BMI. General appearance: alert, cooperative, appears stated age and  moderate distress Head: Normocephalic, atraumatic. Eyes: Brown eyes, wears glasses, pink conjunctiva, corneas clear. PERRL, EOM's intact. Neck: No adenopathy, no carotid bruit, Positive JVD at 30 degree, supple, symmetrical, trachea midline and thyroid not enlarged. Resp: Basal Crackles to auscultation bilaterally. Cardio: Regular rate and rhythm, S1, S2 normal, II/VI systolic murmur, no click, rub or gallop GI: Soft, non-tender; bowel sounds normal; no organomegaly. Extremities: 2 + edema both lower legs up to mid-thigh, no cyanosis or clubbing. Skin: Warm and dry.  Neurologic: Alert and oriented X 3, normal strength. Normal coordination and slow gait.  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 IV lasix. Home medications. Echocardiogram, CXR, EKG.  Time spent: Review of old records, Lab, x-rays, EKG, other cardiac tests, examination, discussion with patient over 70 minutes.  Birdie Riddle, MD  06/03/2019, 7:04 PM

## 2019-06-04 ENCOUNTER — Inpatient Hospital Stay (HOSPITAL_COMMUNITY): Payer: Medicare HMO

## 2019-06-04 LAB — BASIC METABOLIC PANEL
Anion gap: 6 (ref 5–15)
BUN: 14 mg/dL (ref 8–23)
CO2: 25 mmol/L (ref 22–32)
Calcium: 8 mg/dL — ABNORMAL LOW (ref 8.9–10.3)
Chloride: 111 mmol/L (ref 98–111)
Creatinine, Ser: 0.99 mg/dL (ref 0.44–1.00)
GFR calc Af Amer: >60 mL/min (ref >60)
GFR calc non Af Amer: 54 mL/min — ABNORMAL LOW (ref >60)
Glucose, Bld: 97 mg/dL (ref 70–99)
Potassium: 3.6 mmol/L (ref 3.5–5.1)
Sodium: 142 mmol/L (ref 135–145)

## 2019-06-04 LAB — PROTIME-INR
INR: 1.4 — ABNORMAL HIGH (ref 0.8–1.2)
Prothrombin Time: 17 seconds — ABNORMAL HIGH (ref 11.4–15.2)

## 2019-06-04 LAB — ECHOCARDIOGRAM COMPLETE
Height: 67 in
Weight: 4060.8 oz

## 2019-06-04 LAB — SARS CORONAVIRUS 2 (TAT 6-24 HRS): SARS Coronavirus 2: NEGATIVE

## 2019-06-04 LAB — TROPONIN I (HIGH SENSITIVITY)
Troponin I (High Sensitivity): 9 ng/L (ref <18)
Troponin I (High Sensitivity): 6 ng/L (ref <18)

## 2019-06-04 MED ORDER — INFLUENZA VAC A&B SA ADJ QUAD 0.5 ML IM PRSY
0.5000 mL | PREFILLED_SYRINGE | INTRAMUSCULAR | Status: DC
  Filled 2019-06-04: qty 0.5

## 2019-06-04 NOTE — Progress Notes (Signed)
  Echocardiogram 2D Echocardiogram has been performed.  Sidharth Leverette L Androw 06/04/2019, 11:26 AM

## 2019-06-04 NOTE — Consult Note (Signed)
   [facility] CM Inpatient Consult   06/04/2019  Jamel Bumstead 1940/10/13 [id number]    Patient was checked as a benefit from her [facility] plan and risk score for unplanned readmission and hospitalization.  Patient had been engaged by [facility] care management coordinators in distant past, however, due to inability to maintain contact, and with primary care provider not in-network, she was made inactive and case was closed.  Our community based plan of care has focused on disease management and community resource support.  Chart review and MD history and physical shows as:   78 years old black female with PMH of paroxysmal atrial fibrillation, S/P pacemaker placement for sick sinus syndrome, hypertension, obesity, hyperlipidemia, Asthma, arthritis and diastolic heat failure,    presented with shortness of breath and bilateral leg edema with several pounds of weight gain. 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  Review of medical records reveal that patient's current primary care provider is Dr. Myrtie Soman isnot a [facility] primary care provider.  Patient isNOTcurrently a beneficiary of the attributed [place] in the [employer] and NOT currently covered for [place].  Reason: Patient's current Primary Care Provider is NOT a [facility] provider and not affiliated with [employer] Membership roster used to verify current status.   For questions and additional information, please contact:  Shereese Bonnie A. Ladarrian Asencio, BSN, RN-BC [facility] Liaison Cell: [phone]

## 2019-06-05 ENCOUNTER — Inpatient Hospital Stay (HOSPITAL_COMMUNITY): Payer: Medicare HMO

## 2019-06-05 LAB — BASIC METABOLIC PANEL
Anion gap: 8 (ref 5–15)
BUN: 7 mg/dL — ABNORMAL LOW (ref 8–23)
CO2: 25 mmol/L (ref 22–32)
Calcium: 8.1 mg/dL — ABNORMAL LOW (ref 8.9–10.3)
Chloride: 106 mmol/L (ref 98–111)
Creatinine, Ser: 1.03 mg/dL — ABNORMAL HIGH (ref 0.44–1.00)
GFR calc Af Amer: 59 mL/min — ABNORMAL LOW (ref >60)
GFR calc non Af Amer: 51 mL/min — ABNORMAL LOW (ref >60)
Glucose, Bld: 96 mg/dL (ref 70–99)
Potassium: 3.4 mmol/L — ABNORMAL LOW (ref 3.5–5.1)
Sodium: 139 mmol/L (ref 135–145)

## 2019-06-05 LAB — PROTIME-INR
INR: 1.4 — ABNORMAL HIGH (ref 0.8–1.2)
Prothrombin Time: 17.2 seconds — ABNORMAL HIGH (ref 11.4–15.2)

## 2019-06-05 MED ORDER — REGADENOSON 0.4 MG/5ML IV SOLN
0.4000 mg | Freq: Once | INTRAVENOUS | Status: AC
  Administered 2019-06-05: 0.4 mg via INTRAVENOUS
  Filled 2019-06-05: qty 5

## 2019-06-05 MED ORDER — AMOXICILLIN 500 MG PO CAPS: 500.0000 mg | ORAL_CAPSULE | Freq: Three times a day (TID) | ORAL | 0 refills | Status: DC

## 2019-06-05 MED ORDER — TECHNETIUM TC 99M TETROFOSMIN IV KIT
10.1000 | PACK | Freq: Once | INTRAVENOUS | Status: AC | PRN
  Administered 2019-06-05: 10.1 via INTRAVENOUS

## 2019-06-05 MED ORDER — REGADENOSON 0.4 MG/5ML IV SOLN
INTRAVENOUS | Status: AC
  Filled 2019-06-05: qty 5

## 2019-06-05 MED ORDER — AMOXICILLIN 500 MG PO CAPS: 500.0000 mg | ORAL_CAPSULE | Freq: Three times a day (TID) | ORAL | Status: DC

## 2019-06-05 MED ORDER — ACETAMINOPHEN 325 MG PO TABS: 650.0000 mg | ORAL_TABLET | ORAL | Status: DC | PRN

## 2019-06-05 NOTE — Progress Notes (Signed)
Ref: Charolette Forward, MD  Late entry:  Subjective:  Feeling better. Had good diuresis with IV lasix. She had chest pressure with shortness of breath, Her SARS test was negative and Chest x-ray is negative for pulmonary edema.   Objective:  Vital Signs in the last 24 hours: Temp:  [97.5 F (36.4 C)-98.6 F (37 C)] 97.5 F (36.4 C) (10/29 1809) Pulse Rate:  [64-75] 75 (10/29 0527) Cardiac Rhythm: Ventricular paced (10/29 0700) Resp:  [16-18] 18 (10/29 1809) BP: (110-138)/(63-89) 110/63 (10/29 1809) SpO2:  [100 %] 100 % (10/29 1809) Weight:  [116.2 kg-116.9 kg] 116.2 kg (10/29 0700)  Physical Exam: BP Readings from Last 1 Encounters:  06/05/19 110/63     Wt Readings from Last 1 Encounters:  06/05/19 116.2 kg    Weight change: -3.863 kg Body mass index is 40.12 kg/m. HEENT: [place]/AT, Eyes-Brown, PERL, EOMI, Conjunctiva-Pink, Sclera-Non-icteric Neck: No JVD, No bruit, Trachea midline. Lungs:  Clearing, Bilateral. Cardiac:  Regular rhythm, normal S1 and S2, no S3. II/VI systolic murmur. Abdomen:  Soft, non-tender. BS present. Extremities:  1 + edema present. No cyanosis. No clubbing. CNS: AxOx3, Cranial nerves grossly intact, moves all 4 extremities.  Skin: Warm and dry.   Intake/Output from previous day: 10/28 0701 - 10/29 0700 In: 1260 [P.O.:1260] Out: 2100 [Urine:2100]    Lab Results: BMET    Component Value Date/Time   NA 139 06/05/2019 0551   NA 142 06/04/2019 0533   NA 137 06/03/2019 2024   K 3.4 (L) 06/05/2019 0551   K 3.6 06/04/2019 0533   K 4.1 06/03/2019 2024   CL 106 06/05/2019 0551   CL 111 06/04/2019 0533   CL 106 06/03/2019 2024   CO2 25 06/05/2019 0551   CO2 25 06/04/2019 0533   CO2 23 06/03/2019 2024   GLUCOSE 96 06/05/2019 0551   GLUCOSE 97 06/04/2019 0533   GLUCOSE 93 06/03/2019 2024   BUN 7 (L) 06/05/2019 0551   BUN 14 06/04/2019 0533   BUN 14 06/03/2019 2024   CREATININE 1.03 (H) 06/05/2019 0551   CREATININE 0.99 06/04/2019 0533   CREATININE 1.01 (H) 06/03/2019 2024   CREATININE 1.08 05/05/2011 1536   CALCIUM 8.1 (L) 06/05/2019 0551   CALCIUM 8.0 (L) 06/04/2019 0533   CALCIUM 8.4 (L) 06/03/2019 2024   GFRNONAA 51 (L) 06/05/2019 0551   GFRNONAA 54 (L) 06/04/2019 0533   GFRNONAA 53 (L) 06/03/2019 2024   GFRNONAA 50 (L) 05/05/2011 1536   GFRAA 59 (L) 06/05/2019 0551   GFRAA >60 06/04/2019 0533   GFRAA >60 06/03/2019 2024   GFRAA 60 (L) 05/05/2011 1536   CBC    Component Value Date/Time   WBC 8.0 06/03/2019 2024   RBC 4.07 06/03/2019 2024   HGB 11.4 (L) 06/03/2019 2024   HCT 36.6 06/03/2019 2024   PLT 260 06/03/2019 2024   MCV 89.9 06/03/2019 2024   MCH 28.0 06/03/2019 2024   MCHC 31.1 06/03/2019 2024   RDW 14.7 06/03/2019 2024   LYMPHSABS 2.7 06/03/2019 2024   MONOABS 0.7 06/03/2019 2024   EOSABS 0.3 06/03/2019 2024   BASOSABS 0.1 06/03/2019 2024   HEPATIC Function Panel Recent Labs    06/03/19 2024  PROT 6.7   HEMOGLOBIN A1C No components found for: HGA1C,  MPG CARDIAC ENZYMES Lab Results  Component Value Date   TROPONINI <0.03 01/05/2015   TROPONINI <0.03 01/04/2015   TROPONINI <0.03 01/04/2015   BNP No results for input(s): PROBNP in the last 8760 hours. TSH Recent Labs  06/03/19 2024  TSH 1.719   CHOLESTEROL No results for input(s): CHOL in the last 8760 hours.  Scheduled Meds: . amoxicillin  500 mg Oral Q8H  . influenza vaccine adjuvanted  0.5 mL Intramuscular Tomorrow-1000  . irbesartan  150 mg Oral Daily  . montelukast  10 mg Oral QHS  . pantoprazole  40 mg Oral Daily  . sodium chloride flush  3 mL Intravenous Q12H  . spironolactone  25 mg Oral Daily  . warfarin  4 mg Oral q1800  . Warfarin - Physician Dosing Inpatient   Does not apply q1800   Continuous Infusions: . sodium chloride     PRN Meds:.sodium chloride, acetaminophen, ondansetron (ZOFRAN) IV, sodium chloride flush  Assessment/Plan: Acute on chronic diastolic CHF Bilateral leg edema Chest pain Obesity,  morbid, class III Essential hypertension GERD Paroxysmal atrial fibrillation Chronic warfarin use Asthma Arthritis S/P Pacemaker placement  NM myocardial perfusion stress test in AM. Check Troponin I. Patient changed to observation status.   LOS: 2 days   Time spent including chart review, lab review, examination, discussion with patient : 30 min   Dixie Dials  MD  06/05/2019, 6:16 PM

## 2019-06-05 NOTE — Discharge Summary (Addendum)
Physician Discharge Summary  Patient ID: [name] MRN: [id number] DOB/AGE: 1940-10-18 78 y.o.  Admit date: 06/03/2019 Discharge date: 06/05/2019  Admission Diagnoses: Acute on chronic left hjeart diastolic failure Hypertension Morbid Obesity Paroxysmal atrial fibrillation, CHA2DS2VASc score of 5 Chronic warfarin use Asthma Arthritis S/P Pacemaker placement   Discharge Diagnoses:  Principal Problem:   Acute on chronic diastolic CHF (congestive heart failure) (HCC) Active Problems:   Chest pain    Obesity, Class III, BMI 40-49.9 (morbid obesity) (HCC)   Essential hypertension   GERD (gastroesophageal reflux disease)   Paroxysmal atrial fibrillation, CHA2DS2VASc score of 5   Chronic warfarin use   Asthma   Arthritis   S/P Pacemaker placement   Upper respiratory tract infection   Headache   Mild tricuspid valve regurgitation  Discharged Condition: fair  Hospital Course: 78 years old female with PMH of paroxysmal atrial fibrillation, S/P pacemaker placement for sick sinus placement, hypertension, Obesity,hyperlipidemia, Asthma, Arthritis and diastolic heart failure had shortness of breath with bilateral leg edema and several pounds of weight gain. She had normal BNP and unremarkable chest x-ray. She had good diuresis with IV lasix use and her breathing improved in 24 to 48 hours.  She underwent echocardiogram showing mild LV systolic dysfunction.  She underwent nuclear stress test for chest pain and shortness of breath. It was negative for reversible ischemia.  Patient developed sore throat and headache today possible from flu-shot. She was given amoxicillin 500 mg. 3 times daily and cough medication.  No B-blocker due to chronic shortness of breath/Asthma. She will see me in 1 week.  Consults: cardiology  Significant Diagnostic Studies: labs: Near normal BMET, troponin I x 2, BNP, TSH and CBC.  EKG: Dual chamber pacemaker with frequent atrial sensed and V-paced with  LBBB rhythm.  Echocardiogram: Mild LV systolic dysfunction. EF 45-50 %. Mild TR.  NM myocardial perfusion stress test: No reversible ischemia and good LV systolic function.  Treatments: cardiac meds: furosemide, warfarin, Spironolactone,  and Valsartan. For URTI: Amoxicillin + OTC cough syrup.  Discharge Exam: Blood pressure 125/85, pulse 75, temperature 98.6 F (37 C), temperature source Oral, resp. rate 18, height 5\' 7"  (1.702 m), weight 116.2 kg, SpO2 100 %. General appearance: alert, cooperative and appears stated age. Head: Normocephalic, atraumatic. Eyes: Brown eyes, wears glasses, pink conjunctiva, corneas clear. PERRL, EOM's intact.  Neck: No adenopathy, no carotid bruit, no JVD, supple, symmetrical, trachea midline and thyroid not enlarged. Resp: Clearing to auscultation bilaterally. Cardio: Regular rate and rhythm, S1, S2 normal, II/VI systolic murmur, no click, rub or gallop. GI: Soft, non-tender; bowel sounds normal; no organomegaly. Extremities: Trace edema, no cyanosis or clubbing. Skin: Warm and dry.  Neurologic: Alert and oriented X 3, normal strength and tone. Normal coordination and slow gait with cane use.  Disposition: Discharge disposition: 01-Home or Self Care        Allergies as of 06/05/2019      Reactions   Albuterol Palpitations, Other (See Comments)   irregular heartbeat      Medication List    TAKE these medications   acetaminophen 325 MG tablet Commonly known as: TYLENOL Take 2 tablets (650 mg total) by mouth every 4 (four) hours as needed for headache or mild pain.   amoxicillin 500 MG capsule Commonly known as: AMOXIL Take 1 capsule (500 mg total) by mouth every 8 (eight) hours.   atorvastatin 20 MG tablet Commonly known as: LIPITOR Take 20 mg by mouth daily.   budesonide-formoterol 80-4.5 MCG/ACT  inhaler Commonly known as: Symbicort Inhale 2 puffs into the lungs 2 (two) times daily.   furosemide 40 MG tablet Commonly known as:  LASIX Take 40 mg by mouth 2 (two) times daily.   levalbuterol 45 MCG/ACT inhaler Commonly known as: Xopenex HFA Inhale 2 puffs into the lungs every 4 (four) hours as needed for wheezing.   montelukast 10 MG tablet Commonly known as: SINGULAIR TAKE 1 TABLET BY MOUTH AT BEDTIME EVERY NIGHT What changed:   how much to take  how to take this  when to take this  additional instructions   omeprazole 20 MG capsule Commonly known as: PRILOSEC Take 1 capsule (20 mg total) by mouth daily. Take 30-60 min before first meal of the day   Prolensa 0.07 % Soln Generic drug: Bromfenac Sodium Apply to eye daily.   Soothe 0.6-0.6 % Soln Generic drug: Propylene Glycol-Glycerin Place 1 drop into both eyes daily.   spironolactone 25 MG tablet Commonly known as: ALDACTONE TAKE ONE TABLET BY MOUTH ONCE DAILY   valsartan 160 MG tablet Commonly known as: DIOVAN Take 160 mg by mouth daily.   warfarin 3 MG tablet Commonly known as: COUMADIN Take as directed. If you are unsure how to take this medication, talk to your nurse or doctor. Original instructions: TAKE 1 & 1/2-2 TABLETS BY MOUTH DAILY OR AS DIRECTED BY COUMADIN CLINIC What changed:   how much to take  how to take this  when to take this  additional instructions      Follow-up Information    Evans Lance, MD .   Specialty: Cardiology Contact information: [id number] [place] [address] [phone]        Dixie Dials, MD. Schedule an appointment as soon as possible for a visit in 1 week(s).   Specialty: Cardiology Contact information: [place] [address] [phone]           Time spent: Review of old chart, current chart, lab, x-ray, cardiac tests and discussion with patient over 60 minutes.  Signed: Birdie Riddle 06/05/2019, 5:55 PM

## 2019-06-05 NOTE — Plan of Care (Signed)

## 2019-06-10 ENCOUNTER — Telehealth (HOSPITAL_COMMUNITY): Payer: Self-pay | Admitting: Licensed Clinical Social Worker

## 2019-06-10 ENCOUNTER — Telehealth: Payer: Self-pay | Admitting: *Deleted

## 2019-06-10 ENCOUNTER — Other Ambulatory Visit: Payer: Self-pay

## 2019-06-10 NOTE — Patient Outreach (Signed)
[facility]) Care Management  06/10/2019  Nakeira Scullin [birth year] [id number]   Telephone call to patient for follow up on referral for transportation.  Patient able to verify HIPAA. Discussed reason for referral.  She states she does not have someone who can just pick her up for a quick appointment.  Advised patient on [employer] services as part of her [employer] benefit.  Patient given the number to [place].  She states she will call.  Patient asked about cost as she states she does not want to pay out of pocket for transportation.  Discussed referral to [place] work for other transportation resources.  Patient refused at this time and will try [employer] as she is looking for transportation she does not have to pay for right now..  RN CM provided patient with CM contact information so if she changes her mind she can at least call CM for referral for social work referral for transportation resources.   She verifies that she see Dr. Terrence Dupont and Dr. Doylene Canard as her PCP.  Both physicians are not part of [facility] presently.   Therefore patient is not eligible for [facility] services.   Plan: RN CM will close case.  Jone Baseman, RN, MSN [facility] Care Management Coordinator Direct Line [phone] Cell [phone] Toll Free: [phone]  Fax: [phone]

## 2019-06-10 NOTE — Telephone Encounter (Signed)
CSW contacted by [facility] regarding pt transportation concerns.  Pt needing INR check and has not been able to get transport to office to get tested.  CSW called pt to discuss.  Pt reports she had been driving but her car broke down then she was told to stop driving due to medical concerns.  Has family but states they all are busy and that she would need to check with them in advance about transportation.  CSW discussed [employer] as a possible option for when her family can't take her and sent her application in the mail to complete her portion and take to next physicians appt.  CSW also called [employer] to check on COVID guidelines- awaiting return call.  CSW then called [facility] as she has been previously involved with patient to see if they could get involved to help with transportation concerns- consult placed.  CSW will continue to follow and assist as needed  Jorge Ny, [facility] Desk#: [phone] Cell#: [phone]

## 2019-06-10 NOTE — Telephone Encounter (Signed)
Called the pt since she has been hospitalized and she is overdue for having her INR checked. Pt has a self monitoring machine but has been unable to obtain her INR. Also, she has had training from the company multiple times and they are unable to go back out at this time for more training. Reminded pt that she is at risk for stroke, blood clots, and bleeding if INR is not checked and if her Warfarin is not dosed appropriately. Asked the pt if I could schedule her an appt for this week or early next week and she stated she would have to call me back. Gave her our direct number of [phone] and she stated she would let me know tomorrow. Will follow up with her tomorrow and advised that if I don't hear from her by 11am tomorrow, I will call her and she verbalized understanding.  Pt states she received a call from a Education officer, museum about transportation help and she will be be looking into those as well. Advised to keep [place] updated and that we need to ensure we get an appt scheduled soon since she was discharged from hospital recently and hasn't had INR checked since July 31st 2020.

## 2019-06-11 NOTE — Telephone Encounter (Signed)
Called pt and she states "I have tried to call you back but her phone died". She asked "if we could come out to her home" and I advised that we cannot as we work in the office. She stated I know I ned my level checked since being out the hospital", I advised yes ma'am we do need to check you INR. She then stated I knew that and that she got me confused with someone else". She stated "my boyfriend died and he was 77 yrs old", expressed condolences and she stated "thank you & we buried him yesterday". Expressed condolences again and asked how she was doing and she stated "okay". Then she stated "well, let me call my brother to see if he can bring me to an appointment". She stated " I will call you back" and I asked if she still had the number from yesterday and she stated "yes". Advised her that I would call her back in 2 hrs if I don't hear from her and she stated "that would be fine".

## 2019-06-11 NOTE — Telephone Encounter (Signed)
Returned a call to the pt and the pt said "I forgot to call my brother", She stated she "would call now and she asked the times we have available" and 1145am and 345pm available, "she said I'll take the 1145am since my brother will have to bring me and he likes mornings". Made the pt an appt for tomorrow and she will call if anything needs to change.

## 2019-06-12 ENCOUNTER — Ambulatory Visit (INDEPENDENT_AMBULATORY_CARE_PROVIDER_SITE_OTHER): Payer: Medicare HMO | Admitting: *Deleted

## 2019-06-12 ENCOUNTER — Other Ambulatory Visit: Payer: Self-pay

## 2019-06-12 DIAGNOSIS — I471 Supraventricular tachycardia, unspecified: Secondary | ICD-10-CM

## 2019-06-12 DIAGNOSIS — Z950 Presence of cardiac pacemaker: Secondary | ICD-10-CM | POA: Diagnosis not present

## 2019-06-12 DIAGNOSIS — Z5181 Encounter for therapeutic drug level monitoring: Secondary | ICD-10-CM

## 2019-06-12 LAB — POCT INR: INR: 1.2 — AB (ref 2.0–3.0)

## 2019-06-12 NOTE — Patient Instructions (Signed)
Description   Take 2.5 tablets today and 2 tablets tomorrow, then resume same dosage 1.5 tablets daily except 2 tablets on Sundays and Thursdays.  Repeat INR in 1 week in the office. [facility] [phone]

## 2019-06-16 DIAGNOSIS — I10 Essential (primary) hypertension: Secondary | ICD-10-CM | POA: Diagnosis not present

## 2019-06-16 DIAGNOSIS — R0602 Shortness of breath: Secondary | ICD-10-CM | POA: Diagnosis not present

## 2019-06-16 DIAGNOSIS — R072 Precordial pain: Secondary | ICD-10-CM | POA: Diagnosis not present

## 2019-06-16 DIAGNOSIS — R1013 Epigastric pain: Secondary | ICD-10-CM | POA: Diagnosis not present

## 2019-06-20 ENCOUNTER — Telehealth: Payer: Self-pay | Admitting: Internal Medicine

## 2019-06-20 NOTE — Telephone Encounter (Signed)
Returned a call to Manuela Schwartz with [employer] and she was unavailable so had to leave a message with receptionist for her to call me back directly.  Called pt since she missed yesterday's appt and she got rescheduled for 06/24/2019 at 215pm. Pt has been noncompliant with completing home monitoring since obtaining the POC machine in June 2020 and hasn't been compliant with office visits since August 2020. She is aware she will need to pack up her self monitoring machine so they can pick it up soon and she verbalized understanding as she is not willing to stick her self for an INR.    Received a call back from Manuela Schwartz and she asked if I could send over a discontinue order since pt is being scheduled in the office and advised that I would. Faxed order to [employer] is [phone].

## 2019-06-20 NOTE — Telephone Encounter (Signed)
° ° °  Levada Dy from [facility] called to request discontinue order for INR, states patient declined to be trained on checking INR. Company needs order to d'c so equipment can be returned  Please call [phone] ask for Manuela Schwartz

## 2019-07-16 DIAGNOSIS — I10 Essential (primary) hypertension: Secondary | ICD-10-CM | POA: Diagnosis not present

## 2019-07-16 DIAGNOSIS — R0602 Shortness of breath: Secondary | ICD-10-CM | POA: Diagnosis not present

## 2019-07-16 DIAGNOSIS — I5032 Chronic diastolic (congestive) heart failure: Secondary | ICD-10-CM | POA: Diagnosis not present

## 2019-07-16 DIAGNOSIS — E6609 Other obesity due to excess calories: Secondary | ICD-10-CM | POA: Diagnosis not present

## 2019-07-23 ENCOUNTER — Telehealth (INDEPENDENT_AMBULATORY_CARE_PROVIDER_SITE_OTHER): Payer: Medicare HMO | Admitting: Internal Medicine

## 2019-07-23 DIAGNOSIS — I5022 Chronic systolic (congestive) heart failure: Secondary | ICD-10-CM

## 2019-07-23 DIAGNOSIS — I5042 Chronic combined systolic (congestive) and diastolic (congestive) heart failure: Secondary | ICD-10-CM | POA: Diagnosis not present

## 2019-07-23 DIAGNOSIS — Z950 Presence of cardiac pacemaker: Secondary | ICD-10-CM

## 2019-07-23 DIAGNOSIS — I442 Atrioventricular block, complete: Secondary | ICD-10-CM

## 2019-07-23 NOTE — Progress Notes (Signed)
Electrophysiology TeleHealth Note   Due to national recommendations of social distancing due to COVID 19, an audio/video telehealth visit is felt to be most appropriate for this patient at this time.  See MyChart message from today for the patient's consent to telehealth for [name].   Date:  07/23/2019   ID:  [name], DOB [birth year], MRN [id number]  Location: patient's home  Provider location: [address], [place]  Evaluation Performed: Follow-up visit  PCP:  Charolette Forward, MD  Cardiologist:  Cristopher Peru, MD  Electrophysiologist:  Dr Lovena Le  Chief Complaint:  "I've been staying inside."  History of Present Illness:    [name] is a 78 y.o. female who presents via audio/video conferencing for a telehealth visit today. She is a pleasant 78 yo woman with a h/o CHB, HTN, s/p PPM insertion. She has been hospitalized with a CHF exacerbation and her EF was 45%. She feels better but she still admits to some sodium indiscretion. Today, she denies symptoms of palpitations, chest pain, shortness of breath,  lower extremity edema, dizziness, presyncope, or syncope.  The patient is otherwise without complaint today.  The patient denies symptoms of fevers, chills, cough, or new SOB worrisome for COVID 19.  Past Medical History:  Diagnosis Date  . Allergic rhinitis   . Anemia    hx due to med  . Arthritis   . Asthma   . CHF (congestive heart failure) ([name])   . Emphysema of lung ([place])   . Hyperlipidemia   . Hypertension   . Pacemaker   . PAT (paroxysmal atrial tachycardia) ([place]) 2008  . Pneumonia    hx  . 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 ABDOMINAL HYSTERECTOMY    . BREAST BIOPSY  04/30/2012   Procedure: BREAST BIOPSY WITH NEEDLE LOCALIZATION;  Surgeon: Rolm Bookbinder, MD;  Location: [facility];  Service: General;  Laterality: Left;  Left breast wire localization  biospy  . HEMORRHOID SURGERY    . PACEMAKER INSERTION  02-06-2011  . TAH and BSO    . TONSILLECTOMY      Current Outpatient Medications  Medication Sig Dispense Refill  . acetaminophen (TYLENOL) 325 MG tablet Take 2 tablets (650 mg total) by mouth every 4 (four) hours as needed for headache or mild pain.    Marland Kitchen amoxicillin (AMOXIL) 500 MG capsule Take 1 capsule (500 mg total) by mouth every 8 (eight) hours. 30 capsule 0  . atorvastatin (LIPITOR) 20 MG tablet Take 20 mg by mouth daily.    . Bromfenac Sodium (PROLENSA) 0.07 % SOLN Apply to eye daily.     . budesonide-formoterol (SYMBICORT) 80-4.5 MCG/ACT inhaler Inhale 2 puffs into the lungs 2 (two) times daily. 3 Inhaler 1  . furosemide (LASIX) 40 MG tablet Take 40 mg by mouth 2 (two) times daily.     Marland Kitchen levalbuterol (XOPENEX HFA) 45 MCG/ACT inhaler Inhale 2 puffs into the lungs every 4 (four) hours as needed for wheezing. 3 Inhaler 1  . montelukast (SINGULAIR) 10 MG tablet TAKE 1 TABLET BY MOUTH AT BEDTIME EVERY NIGHT (Patient taking differently: Take 10 mg by mouth at bedtime. ) 30 tablet 2  . omeprazole (PRILOSEC) 20 MG capsule Take 1 capsule (20 mg total) by mouth daily. Take 30-60 min before first meal of the day 30 capsule 5  . Propylene Glycol-Glycerin (  SOOTHE) 0.6-0.6 % SOLN Place 1 drop into both eyes daily.     Marland Kitchen spironolactone (ALDACTONE) 25 MG tablet TAKE ONE TABLET BY MOUTH ONCE DAILY (Patient taking differently: Take 25 mg by mouth daily. ) 30 tablet 1  . valsartan (DIOVAN) 160 MG tablet Take 160 mg by mouth daily.    Marland Kitchen warfarin (COUMADIN) 3 MG tablet TAKE 1 & 1/2-2 TABLETS BY MOUTH DAILY OR AS DIRECTED BY COUMADIN CLINIC (Patient taking differently: Take 4.5-6 mg by mouth See admin instructions. Take 6mg  on THUR and SUN and 4.5mg  on all other days.) 150 tablet 0   No current facility-administered medications for this visit.    Allergies:   Albuterol   Social History:  The patient  reports that she quit smoking about 41 years ago.  Her smoking use included cigarettes. She has a 4.00 pack-year smoking history. She has never used smokeless tobacco. She reports that she does not drink alcohol or use drugs.   Family History:  The patient's  family history includes Cancer in her sister; Heart disease in her mother.   ROS:  Please see the history of present illness.   All other systems are personally reviewed and negative.    Exam:    Vital Signs:  There were no vitals taken for this visit.    Labs/Other Tests and Data Reviewed:    Recent Labs: 06/03/2019: ALT 14; B Natriuretic Peptide 38.5; Hemoglobin 11.4; Platelets 260; TSH 1.719 06/05/2019: BUN 7; Creatinine, Ser 1.03; Potassium 3.4; Sodium 139   Wt Readings from Last 3 Encounters:  06/05/19 256 lb 2.8 oz (116.2 kg)  09/06/18 263 lb (119.3 kg)  08/15/18 260 lb (117.9 kg)     Other studies personally reviewed:  ASSESSMENT & PLAN:    1.  CHB - she is asymptomatic s/p PPM insertion 2. PPM - her PM has not been interrogated since 11/19. We will have her come back when COVID is better for an in office check. 3. Chronic systolic/diastolic heart failure - she is much better since being in the hospital. She will continue her current meds and maintain a low sodium diet. She admits to some dietary indiscretion.   4. COVID 19 screen The patient denies symptoms of COVID 19 at this time.  The importance of social distancing was discussed today.  Follow-up:  6 months Next remote: n/a  Current medicines are reviewed at length with the patient today.   The patient does not have concerns regarding her medicines.  The following changes were made today:  none  Labs/ tests ordered today include: none No orders of the defined types were placed in this encounter.    Patient Risk:  after full review of this patients clinical status, I feel that they are at moderate risk at this time.  Today, I have spent 15 minutes with the patient with telehealth technology discussing  all of the above .    Signed, Cristopher Peru, MD  07/23/2019 11:10 AM     [name] [address] [place] [address] [phone] (office) [phone] (fax)

## 2019-07-24 ENCOUNTER — Telehealth: Payer: Self-pay | Admitting: Internal Medicine

## 2019-07-24 NOTE — Telephone Encounter (Signed)
Patient calling because her son's wife is a doctor and would like to speak with Dr. Lovena Le per patients virtual appt with him yesterday. I advised her that Dr. Lovena Le was not in the office.

## 2019-07-24 NOTE — Telephone Encounter (Signed)
Returned call to Pt.  Per Pt please call daughter in law Mrs. Anthis at [phone] and give update.  Call placed.  Gave update on Pt device.  Advised Pt probably has about 1.1 years on battery but Pt does not do remotes.  DIL will discuss with family.  Asks we call Pt tomorrow to see if she would like to do remote checks.

## 2019-07-29 NOTE — Telephone Encounter (Signed)
I spoke with the pt and she agreed to do remote monitoring.   I ordered her a monitor with [employer] and it will take 10 business days to get to her.

## 2019-08-11 NOTE — Telephone Encounter (Signed)
I called to follow up with the pt see if she received her home remote monitor. The pt states she has not. I told her I will call [employer] to see if I can see if the shipped the monitor yet.

## 2019-08-12 NOTE — Telephone Encounter (Signed)
I called [employer] to track the pt monitor. The rep told me the monitor was delivered on 08-05-2019.   I conference called the pt with [employer] rep on the phone and she found the monitor. I asked her do she wants to hook the monitor up while I have the representative on the phone. The pt declined and wants to wait on her son. She states she will call me when they set it up.

## 2019-08-19 NOTE — Telephone Encounter (Signed)
The pt states her son tried to hook up the monitor for her but was unsuccessful. I told her the son can call [employer] so they can help him set up the monitor. The son went back on the road and when he get back in town he is going to try to hook the monitor up again.

## 2019-09-03 ENCOUNTER — Telehealth: Payer: Self-pay

## 2019-09-03 NOTE — Telephone Encounter (Signed)
I called the pt to get a transmission with her home monitor but I got no answer/mailbox full.

## 2019-09-04 NOTE — Telephone Encounter (Signed)
Spoke with patient. We reviewed importance of being able to check her device either remotely or in person. She is not confident in her ability to set up monitor. I offered device clinic appt for device interrogation and monitor set up.  She will look for transportation and will call us back and let us know when she can come.  [place], if she hasn't called back by Monday, please call her again and schedule appointment.  Chanetta Marshall, NP 09/04/2019 8:37 AM

## 2019-09-08 ENCOUNTER — Telehealth: Payer: Self-pay

## 2019-09-08 NOTE — Telephone Encounter (Signed)
The pt states she has an appointment with Chantelle Verdi tomorrow to get help setting up her home monitor. She was told to come in between the hours of 7 am and 2 pm. I talked to device nurse Jenny Reichmann, Rn and she going to schedule the pt with device clinic tomorrow at 2:30 pm.

## 2019-09-08 NOTE — Telephone Encounter (Signed)
Patient was placed on device clinic schedule for 1430 to have device interrogated and set up monitor.

## 2019-09-16 DIAGNOSIS — I10 Essential (primary) hypertension: Secondary | ICD-10-CM | POA: Diagnosis not present

## 2019-09-16 DIAGNOSIS — R072 Precordial pain: Secondary | ICD-10-CM | POA: Diagnosis not present

## 2019-09-16 DIAGNOSIS — R0602 Shortness of breath: Secondary | ICD-10-CM | POA: Diagnosis not present

## 2019-09-16 DIAGNOSIS — E6609 Other obesity due to excess calories: Secondary | ICD-10-CM | POA: Diagnosis not present

## 2019-10-16 ENCOUNTER — Ambulatory Visit (INDEPENDENT_AMBULATORY_CARE_PROVIDER_SITE_OTHER): Payer: Medicare HMO

## 2019-10-16 ENCOUNTER — Ambulatory Visit (INDEPENDENT_AMBULATORY_CARE_PROVIDER_SITE_OTHER): Payer: Medicare HMO | Admitting: Student

## 2019-10-16 ENCOUNTER — Other Ambulatory Visit: Payer: Self-pay

## 2019-10-16 DIAGNOSIS — I442 Atrioventricular block, complete: Secondary | ICD-10-CM

## 2019-10-16 DIAGNOSIS — I471 Supraventricular tachycardia: Secondary | ICD-10-CM

## 2019-10-16 DIAGNOSIS — Z5181 Encounter for therapeutic drug level monitoring: Secondary | ICD-10-CM

## 2019-10-16 DIAGNOSIS — Z950 Presence of cardiac pacemaker: Secondary | ICD-10-CM | POA: Diagnosis not present

## 2019-10-16 LAB — CUP PACEART INCLINIC DEVICE CHECK
Battery Remaining Longevity: 5 mo
Battery Voltage: 2.66 V
Brady Statistic RA Percent Paced: 19 %
Brady Statistic RV Percent Paced: 99.94 %
Date Time Interrogation Session: 20210311143942
Implantable Lead Implant Date: 20120702
Implantable Lead Implant Date: 20120702
Implantable Lead Location: 753859
Implantable Lead Location: 753860
Implantable Pulse Generator Implant Date: 20120702
Lead Channel Impedance Value: 362.5 Ohm
Lead Channel Impedance Value: 400 Ohm
Lead Channel Pacing Threshold Amplitude: 0.75 V
Lead Channel Pacing Threshold Amplitude: 0.75 V
Lead Channel Pacing Threshold Amplitude: 1.25 V
Lead Channel Pacing Threshold Pulse Width: 0.4 ms
Lead Channel Pacing Threshold Pulse Width: 0.4 ms
Lead Channel Pacing Threshold Pulse Width: 0.5 ms
Lead Channel Sensing Intrinsic Amplitude: 12 mV
Lead Channel Sensing Intrinsic Amplitude: 3.9 mV
Lead Channel Setting Pacing Amplitude: 1.5 V
Lead Channel Setting Pacing Amplitude: 2 V
Lead Channel Setting Pacing Pulse Width: 0.5 ms
Lead Channel Setting Sensing Sensitivity: 4 mV
Pulse Gen Model: 2210
Pulse Gen Serial Number: 7254877

## 2019-10-16 LAB — POCT INR: INR: 1.7 — AB (ref 2.0–3.0)

## 2019-10-16 NOTE — Patient Instructions (Signed)
Description   Take 2.5 tablets today, then start taking 1.5 tablets daily except 2 tablets on Sundays, Tuesdays and Thursdays.  Repeat INR in 3 weeks in the office. [facility] [phone]

## 2019-10-16 NOTE — Progress Notes (Signed)
Pacemaker check in clinic. Normal device function. Thresholds, sensing, impedances consistent with previous measurements. Device programmed to maximize longevity. AF burden 1.1%, known on coumadin. Device programmed at appropriate safety margins. Histogram distribution appropriate for patient activity level. Device programmed to optimize intrinsic conduction. Estimated longevity 5 months. Patient had been unable to set up monitoring. Provided with cellular adapter that she will plug in and try at home. We attempted multiple times here. Unable to get cell signal.  She willl need monthly battery checks and 6 month visit with Dr. Lovena Le to discuss gen change.  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 Pt has not had AV nodal ablation. She has had ablation of an AVNRT.

## 2019-11-04 DIAGNOSIS — R0602 Shortness of breath: Secondary | ICD-10-CM | POA: Diagnosis not present

## 2019-11-04 DIAGNOSIS — R072 Precordial pain: Secondary | ICD-10-CM | POA: Diagnosis not present

## 2019-11-04 DIAGNOSIS — I10 Essential (primary) hypertension: Secondary | ICD-10-CM | POA: Diagnosis not present

## 2019-11-04 DIAGNOSIS — E6609 Other obesity due to excess calories: Secondary | ICD-10-CM | POA: Diagnosis not present

## 2019-11-06 ENCOUNTER — Other Ambulatory Visit: Payer: Self-pay

## 2019-11-06 ENCOUNTER — Ambulatory Visit (INDEPENDENT_AMBULATORY_CARE_PROVIDER_SITE_OTHER): Payer: Medicare HMO

## 2019-11-06 DIAGNOSIS — Z5181 Encounter for therapeutic drug level monitoring: Secondary | ICD-10-CM

## 2019-11-06 DIAGNOSIS — I471 Supraventricular tachycardia: Secondary | ICD-10-CM

## 2019-11-06 DIAGNOSIS — Z950 Presence of cardiac pacemaker: Secondary | ICD-10-CM | POA: Diagnosis not present

## 2019-11-06 LAB — POCT INR: INR: 1.3 — AB (ref 2.0–3.0)

## 2019-11-06 NOTE — Patient Instructions (Signed)
Description   Take 2.5 tablets today, then take 2 tablets tomorrow, then resume same dosage 1.5 tablets daily except 2 tablets on Sundays, Tuesdays and Thursdays.  Repeat INR in 2 weeks in the office. [facility] [phone]

## 2019-11-20 ENCOUNTER — Other Ambulatory Visit: Payer: Self-pay

## 2019-11-20 ENCOUNTER — Ambulatory Visit (INDEPENDENT_AMBULATORY_CARE_PROVIDER_SITE_OTHER): Payer: Medicare HMO | Admitting: *Deleted

## 2019-11-20 DIAGNOSIS — Z5181 Encounter for therapeutic drug level monitoring: Secondary | ICD-10-CM | POA: Diagnosis not present

## 2019-11-20 DIAGNOSIS — I471 Supraventricular tachycardia: Secondary | ICD-10-CM

## 2019-11-20 DIAGNOSIS — Z950 Presence of cardiac pacemaker: Secondary | ICD-10-CM | POA: Diagnosis not present

## 2019-11-20 LAB — POCT INR: INR: 1.3 — AB (ref 2.0–3.0)

## 2019-11-20 NOTE — Patient Instructions (Signed)
Description   Take 2.5 tablets today, then take 2 tablets tomorrow, then change your dose to 2 tablets daily except 1.5 tablets on Mondays, Wednesdays, and Fridays.  Repeat INR in 2 weeks in the office. [facility] [phone]

## 2019-12-04 ENCOUNTER — Other Ambulatory Visit: Payer: Self-pay

## 2019-12-04 ENCOUNTER — Ambulatory Visit (INDEPENDENT_AMBULATORY_CARE_PROVIDER_SITE_OTHER): Payer: Medicare HMO | Admitting: *Deleted

## 2019-12-04 DIAGNOSIS — I471 Supraventricular tachycardia: Secondary | ICD-10-CM | POA: Diagnosis not present

## 2019-12-04 DIAGNOSIS — Z5181 Encounter for therapeutic drug level monitoring: Secondary | ICD-10-CM | POA: Diagnosis not present

## 2019-12-04 DIAGNOSIS — Z950 Presence of cardiac pacemaker: Secondary | ICD-10-CM

## 2019-12-04 LAB — POCT INR: INR: 1.4 — AB (ref 2.0–3.0)

## 2019-12-04 NOTE — Patient Instructions (Addendum)
Description   Please try to remember to take your medication. Take 2.5 tablets today and take 2 tablets tomorrow, then continue taking 2 tablets daily except 1.5 tablets on Mondays, Wednesdays, and Fridays. Repeat INR in 1 week in the office. [facility] [phone] Main Number [phone]

## 2019-12-05 ENCOUNTER — Ambulatory Visit (INDEPENDENT_AMBULATORY_CARE_PROVIDER_SITE_OTHER): Payer: Medicare HMO | Admitting: *Deleted

## 2019-12-05 ENCOUNTER — Telehealth: Payer: Self-pay | Admitting: Internal Medicine

## 2019-12-05 DIAGNOSIS — I442 Atrioventricular block, complete: Secondary | ICD-10-CM | POA: Diagnosis not present

## 2019-12-05 LAB — CUP PACEART REMOTE DEVICE CHECK
Battery Remaining Longevity: 1 mo
Battery Remaining Percentage: 1 %
Battery Voltage: 2.63 V
Brady Statistic AP VP Percent: 37 %
Brady Statistic AP VS Percent: 1 %
Brady Statistic AS VP Percent: 62 %
Brady Statistic AS VS Percent: 1 %
Brady Statistic RA Percent Paced: 37 %
Brady Statistic RV Percent Paced: 99 %
Date Time Interrogation Session: 20210430155729
Implantable Lead Implant Date: 20120702
Implantable Lead Implant Date: 20120702
Implantable Lead Location: 753859
Implantable Lead Location: 753860
Implantable Pulse Generator Implant Date: 20120702
Lead Channel Impedance Value: 350 Ohm
Lead Channel Impedance Value: 400 Ohm
Lead Channel Pacing Threshold Amplitude: 0.75 V
Lead Channel Pacing Threshold Amplitude: 1 V
Lead Channel Pacing Threshold Pulse Width: 0.4 ms
Lead Channel Pacing Threshold Pulse Width: 0.5 ms
Lead Channel Sensing Intrinsic Amplitude: 12 mV
Lead Channel Sensing Intrinsic Amplitude: 3.3 mV
Lead Channel Setting Pacing Amplitude: 1.25 V
Lead Channel Setting Pacing Amplitude: 2 V
Lead Channel Setting Pacing Pulse Width: 0.5 ms
Lead Channel Setting Sensing Sensitivity: 4 mV
Pulse Gen Model: 2210
Pulse Gen Serial Number: 7254877

## 2019-12-05 NOTE — Progress Notes (Signed)
PPM Remote  

## 2019-12-05 NOTE — Telephone Encounter (Signed)
The pt transmission was received. I told her the nurse will review it and if she see anything she will call. I told her no news is good news. She will not get a call if everything looks good.

## 2019-12-05 NOTE — Telephone Encounter (Signed)
Daughter in [place] if the patient was calling to try and get the patient's [employer] machine hooked back up. Please call to assist    Please route to [place]

## 2019-12-11 ENCOUNTER — Ambulatory Visit (INDEPENDENT_AMBULATORY_CARE_PROVIDER_SITE_OTHER): Payer: Medicare HMO | Admitting: *Deleted

## 2019-12-11 ENCOUNTER — Other Ambulatory Visit: Payer: Self-pay

## 2019-12-11 DIAGNOSIS — Z5181 Encounter for therapeutic drug level monitoring: Secondary | ICD-10-CM | POA: Diagnosis not present

## 2019-12-11 DIAGNOSIS — I471 Supraventricular tachycardia: Secondary | ICD-10-CM

## 2019-12-11 DIAGNOSIS — Z950 Presence of cardiac pacemaker: Secondary | ICD-10-CM

## 2019-12-11 LAB — POCT INR: INR: 2.1 (ref 2.0–3.0)

## 2019-12-11 NOTE — Patient Instructions (Signed)
Description   Continue taking 2 tablets daily except 1.5 tablets on Mondays, Wednesdays, and Fridays. Repeat INR in 2 weeks in the office. [facility] Clinic [phone] Main Number [phone]

## 2019-12-25 ENCOUNTER — Ambulatory Visit (INDEPENDENT_AMBULATORY_CARE_PROVIDER_SITE_OTHER): Payer: Medicare HMO | Admitting: Pharmacist

## 2019-12-25 ENCOUNTER — Other Ambulatory Visit: Payer: Self-pay

## 2019-12-25 DIAGNOSIS — Z5181 Encounter for therapeutic drug level monitoring: Secondary | ICD-10-CM | POA: Diagnosis not present

## 2019-12-25 DIAGNOSIS — Z950 Presence of cardiac pacemaker: Secondary | ICD-10-CM

## 2019-12-25 DIAGNOSIS — I471 Supraventricular tachycardia: Secondary | ICD-10-CM

## 2019-12-25 LAB — POCT INR: INR: 2 (ref 2.0–3.0)

## 2019-12-25 NOTE — Patient Instructions (Signed)
Continue taking 2 tablets daily except 1.5 tablets on Mondays, Wednesdays, and Fridays. Repeat INR in 3 weeks in the office. [facility] [phone] Main Number [phone]

## 2020-01-01 DIAGNOSIS — I10 Essential (primary) hypertension: Secondary | ICD-10-CM | POA: Diagnosis not present

## 2020-01-01 DIAGNOSIS — R072 Precordial pain: Secondary | ICD-10-CM | POA: Diagnosis not present

## 2020-01-01 DIAGNOSIS — R0602 Shortness of breath: Secondary | ICD-10-CM | POA: Diagnosis not present

## 2020-01-01 DIAGNOSIS — E6609 Other obesity due to excess calories: Secondary | ICD-10-CM | POA: Diagnosis not present

## 2020-01-02 ENCOUNTER — Other Ambulatory Visit: Payer: Self-pay

## 2020-01-02 ENCOUNTER — Inpatient Hospital Stay (HOSPITAL_COMMUNITY)
Admission: EM | Admit: 2020-01-02 | Discharge: 2020-01-05 | DRG: 292 | Disposition: A | Discharge status: Home Health | Payer: Medicare HMO | Attending: Cardiovascular Disease | Admitting: Cardiovascular Disease

## 2020-01-02 ENCOUNTER — Emergency Department (HOSPITAL_COMMUNITY): Payer: Medicare HMO

## 2020-01-02 ENCOUNTER — Encounter (HOSPITAL_COMMUNITY): Payer: Self-pay | Admitting: Emergency Medicine

## 2020-01-02 DIAGNOSIS — Z8249 Family history of ischemic heart disease and other diseases of the circulatory system: Secondary | ICD-10-CM

## 2020-01-02 DIAGNOSIS — I959 Hypotension, unspecified: Secondary | ICD-10-CM | POA: Diagnosis not present

## 2020-01-02 DIAGNOSIS — I5032 Chronic diastolic (congestive) heart failure: Secondary | ICD-10-CM | POA: Diagnosis present

## 2020-01-02 DIAGNOSIS — I480 Paroxysmal atrial fibrillation: Secondary | ICD-10-CM | POA: Diagnosis not present

## 2020-01-02 DIAGNOSIS — M1990 Unspecified osteoarthritis, unspecified site: Secondary | ICD-10-CM | POA: Diagnosis not present

## 2020-01-02 DIAGNOSIS — J45909 Unspecified asthma, uncomplicated: Secondary | ICD-10-CM | POA: Diagnosis present

## 2020-01-02 DIAGNOSIS — I110 Hypertensive heart disease with heart failure: Principal | ICD-10-CM | POA: Diagnosis present

## 2020-01-02 DIAGNOSIS — Z8701 Personal history of pneumonia (recurrent): Secondary | ICD-10-CM

## 2020-01-02 DIAGNOSIS — Z6841 Body Mass Index (BMI) 40.0 and over, adult: Secondary | ICD-10-CM | POA: Diagnosis not present

## 2020-01-02 DIAGNOSIS — R0602 Shortness of breath: Secondary | ICD-10-CM | POA: Diagnosis not present

## 2020-01-02 DIAGNOSIS — Z7901 Long term (current) use of anticoagulants: Secondary | ICD-10-CM

## 2020-01-02 DIAGNOSIS — I361 Nonrheumatic tricuspid (valve) insufficiency: Secondary | ICD-10-CM | POA: Diagnosis not present

## 2020-01-02 DIAGNOSIS — I10 Essential (primary) hypertension: Secondary | ICD-10-CM | POA: Diagnosis not present

## 2020-01-02 DIAGNOSIS — E785 Hyperlipidemia, unspecified: Secondary | ICD-10-CM | POA: Diagnosis present

## 2020-01-02 DIAGNOSIS — Z79899 Other long term (current) drug therapy: Secondary | ICD-10-CM

## 2020-01-02 DIAGNOSIS — Z20822 Contact with and (suspected) exposure to covid-19: Secondary | ICD-10-CM | POA: Diagnosis present

## 2020-01-02 DIAGNOSIS — R0789 Other chest pain: Secondary | ICD-10-CM | POA: Diagnosis not present

## 2020-01-02 DIAGNOSIS — Z7951 Long term (current) use of inhaled steroids: Secondary | ICD-10-CM

## 2020-01-02 DIAGNOSIS — J439 Emphysema, unspecified: Secondary | ICD-10-CM | POA: Diagnosis present

## 2020-01-02 DIAGNOSIS — Z90710 Acquired absence of both cervix and uterus: Secondary | ICD-10-CM | POA: Diagnosis not present

## 2020-01-02 DIAGNOSIS — E876 Hypokalemia: Secondary | ICD-10-CM | POA: Diagnosis not present

## 2020-01-02 DIAGNOSIS — Z950 Presence of cardiac pacemaker: Secondary | ICD-10-CM

## 2020-01-02 DIAGNOSIS — Z888 Allergy status to other drugs, medicaments and biological substances status: Secondary | ICD-10-CM

## 2020-01-02 DIAGNOSIS — I340 Nonrheumatic mitral (valve) insufficiency: Secondary | ICD-10-CM | POA: Diagnosis not present

## 2020-01-02 DIAGNOSIS — Z87891 Personal history of nicotine dependence: Secondary | ICD-10-CM | POA: Diagnosis not present

## 2020-01-02 DIAGNOSIS — E6609 Other obesity due to excess calories: Secondary | ICD-10-CM | POA: Diagnosis not present

## 2020-01-02 DIAGNOSIS — I5042 Chronic combined systolic (congestive) and diastolic (congestive) heart failure: Secondary | ICD-10-CM | POA: Diagnosis present

## 2020-01-02 DIAGNOSIS — I890 Lymphedema, not elsewhere classified: Secondary | ICD-10-CM | POA: Diagnosis present

## 2020-01-02 HISTORY — DX: Dyspnea, unspecified: R06.00

## 2020-01-02 HISTORY — DX: Gastro-esophageal reflux disease without esophagitis: K21.9

## 2020-01-02 LAB — BASIC METABOLIC PANEL
Anion gap: 10 (ref 5–15)
BUN: 14 mg/dL (ref 8–23)
CO2: 23 mmol/L (ref 22–32)
Calcium: 8.5 mg/dL — ABNORMAL LOW (ref 8.9–10.3)
Chloride: 109 mmol/L (ref 98–111)
Creatinine, Ser: 0.98 mg/dL (ref 0.44–1.00)
GFR calc Af Amer: >60 mL/min (ref >60)
GFR calc non Af Amer: 54 mL/min — ABNORMAL LOW (ref >60)
Glucose, Bld: 103 mg/dL — ABNORMAL HIGH (ref 70–99)
Potassium: 3.5 mmol/L (ref 3.5–5.1)
Sodium: 142 mmol/L (ref 135–145)

## 2020-01-02 LAB — CBC
HCT: 36 % (ref 36.0–46.0)
Hemoglobin: 11.1 g/dL — ABNORMAL LOW (ref 12.0–15.0)
MCH: 27.3 pg (ref 26.0–34.0)
MCHC: 30.8 g/dL (ref 30.0–36.0)
MCV: 88.5 fL (ref 80.0–100.0)
Platelets: 220 10*3/uL (ref 150–400)
RBC: 4.07 MIL/uL (ref 3.87–5.11)
RDW: 16.5 % — ABNORMAL HIGH (ref 11.5–15.5)
WBC: 6.5 10*3/uL (ref 4.0–10.5)
nRBC: 0 % (ref 0.0–0.2)

## 2020-01-02 LAB — BRAIN NATRIURETIC PEPTIDE: B Natriuretic Peptide: 39.5 pg/mL (ref 0.0–100.0)

## 2020-01-02 LAB — TROPONIN I (HIGH SENSITIVITY)
Troponin I (High Sensitivity): 6 ng/L (ref <18)
Troponin I (High Sensitivity): 6 ng/L (ref <18)

## 2020-01-02 LAB — SARS CORONAVIRUS 2 BY RT PCR (HOSPITAL ORDER, PERFORMED IN ~~LOC~~ HOSPITAL LAB): SARS Coronavirus 2: NEGATIVE

## 2020-01-02 LAB — PROTIME-INR
INR: 1.8 — ABNORMAL HIGH (ref 0.8–1.2)
Prothrombin Time: 20.2 seconds — ABNORMAL HIGH (ref 11.4–15.2)

## 2020-01-02 MED ORDER — SODIUM CHLORIDE 0.9% FLUSH: 3.0000 mL | INTRAVENOUS | Status: DC | PRN

## 2020-01-02 MED ORDER — HEPARIN SODIUM (PORCINE) 5000 UNIT/ML IJ SOLN: 5000.0000 [IU] | Freq: Three times a day (TID) | INTRAMUSCULAR | Status: DC

## 2020-01-02 MED ORDER — MONTELUKAST SODIUM 10 MG PO TABS
10.0000 mg | ORAL_TABLET | Freq: Every day | ORAL | Status: DC
  Administered 2020-01-02 – 2020-01-04 (×3): 10 mg via ORAL
  Filled 2020-01-02 (×4): qty 1

## 2020-01-02 MED ORDER — LEVALBUTEROL HCL 0.63 MG/3ML IN NEBU: 0.6300 mg | INHALATION_SOLUTION | RESPIRATORY_TRACT | Status: DC | PRN

## 2020-01-02 MED ORDER — PANTOPRAZOLE SODIUM 40 MG PO TBEC
40.0000 mg | DELAYED_RELEASE_TABLET | Freq: Every day | ORAL | Status: DC
  Administered 2020-01-03 – 2020-01-05 (×3): 40 mg via ORAL
  Filled 2020-01-02 (×3): qty 1

## 2020-01-02 MED ORDER — ACETAMINOPHEN 325 MG PO TABS: 650.0000 mg | ORAL_TABLET | ORAL | Status: DC | PRN

## 2020-01-02 MED ORDER — SODIUM CHLORIDE 0.9% FLUSH
3.0000 mL | Freq: Two times a day (BID) | INTRAVENOUS | Status: DC
  Administered 2020-01-02 – 2020-01-05 (×5): 3 mL via INTRAVENOUS

## 2020-01-02 MED ORDER — SODIUM CHLORIDE 0.9% FLUSH: 3.0000 mL | Freq: Once | INTRAVENOUS | Status: AC

## 2020-01-02 MED ORDER — ONDANSETRON HCL 4 MG/2ML IJ SOLN
4.0000 mg | Freq: Four times a day (QID) | INTRAMUSCULAR | Status: DC | PRN
  Administered 2020-01-04: 4 mg via INTRAVENOUS
  Filled 2020-01-02: qty 2

## 2020-01-02 MED ORDER — FUROSEMIDE 10 MG/ML IJ SOLN
40.0000 mg | Freq: Two times a day (BID) | INTRAMUSCULAR | Status: DC
  Administered 2020-01-02 – 2020-01-03 (×3): 40 mg via INTRAVENOUS
  Filled 2020-01-02 (×3): qty 4

## 2020-01-02 MED ORDER — SODIUM CHLORIDE 0.9 % IV SOLN: 250.0000 mL | INTRAVENOUS | Status: DC | PRN

## 2020-01-02 MED ORDER — WARFARIN SODIUM 5 MG PO TABS: 6.0000 mg | ORAL_TABLET | ORAL | Status: DC

## 2020-01-02 MED ORDER — SPIRONOLACTONE 25 MG PO TABS
25.0000 mg | ORAL_TABLET | Freq: Every day | ORAL | Status: DC
  Administered 2020-01-03 – 2020-01-05 (×2): 25 mg via ORAL
  Filled 2020-01-02 (×2): qty 1

## 2020-01-02 MED ORDER — ATORVASTATIN CALCIUM 10 MG PO TABS
20.0000 mg | ORAL_TABLET | Freq: Every day | ORAL | Status: DC
  Administered 2020-01-03 – 2020-01-05 (×3): 20 mg via ORAL
  Filled 2020-01-02 (×3): qty 2

## 2020-01-02 MED ORDER — WARFARIN SODIUM 3 MG PO TABS
4.5000 mg | ORAL_TABLET | ORAL | Status: DC
  Administered 2020-01-02: 4.5 mg via ORAL
  Filled 2020-01-02: qty 1.5

## 2020-01-02 MED ORDER — IRBESARTAN 150 MG PO TABS
150.0000 mg | ORAL_TABLET | Freq: Every day | ORAL | Status: DC
  Administered 2020-01-03 – 2020-01-05 (×2): 150 mg via ORAL
  Filled 2020-01-02 (×2): qty 1

## 2020-01-02 MED ORDER — LEVALBUTEROL TARTRATE 45 MCG/ACT IN AERO: 2.0000 | INHALATION_SPRAY | RESPIRATORY_TRACT | Status: DC | PRN

## 2020-01-02 MED ORDER — WARFARIN - PHYSICIAN DOSING INPATIENT: Freq: Every day | Status: DC

## 2020-01-02 NOTE — ED Triage Notes (Addendum)
Pt reports worsening sob, states that she always has some degree of sob but it has worsened recently. Denies chest pain. resp e/u, nad. Pt does have pacemaker in place, significant swelling to lower extremities.

## 2020-01-02 NOTE — ED Provider Notes (Signed)
Patient placed in Quick Look pathway, seen and evaluated   Chief Complaint: SOB  HPI: History CHF, asthma, obesity, pacemaker, hypertension, hyperlipidemia.  Presents for shortness of breath worsening over the past 3 days, reports chronic shortness of breath ongoing for several years.  No clear inciting event, gradually worsening shortness of breath.  She reports shortness of breath particularly when she is laying flat and trying to bed, she must sleep with pillows underneath her back. Reports she has been compliant with Furosemide and coumadin therapy.     ROS: + SOB, Bilateral LE edema          - Fv/chills, cp, hemoptysis, abd pain, n/v/d, numbness/weakness or additional concerns.  Physical Exam:   Gen: No distress  Neuro: Awake and Alert  Skin: Warm    Focused Exam: Airway clear, no meningeal signs, heart regular rate and rhythm, lung sounds mildly diminished bilaterally, abd soft and nontender, 2+ bilateral LE edema.  Chest pain workup initiated by triage with CBC, BMP, Trop, EKG and CXR. I have added BNP, symptoms suggestive of CHF exacerbation. No chest pain or hemoptysis suggestive of PE, additionally pt reports compliance with coumadin.   Initiation of care has begun. The patient has been counseled on the process, plan, and necessity for staying for the completion/evaluation, and the remainder of the medical screening examination    [name] 01/02/20 1557    Noemi Chapel, MD 01/05/20 [phone]

## 2020-01-02 NOTE — H&P (Signed)
Referring Physician:   [name] is an 79 y.o. female.                       Chief Complaint: Shortness of breath  HPI: 79 years old black female has increasing shortness of breath x 1 month. She has chronic bilateral leg lymphedema. She has increasing orthopnea x 1 month. Her BNP and Troponin I levels are normal and Chest x-ray is unremarkable. EKG shows atrial paced and V-sensed rhythm. She has PMH of Paroxysmal atrial fibrillation, S/P pacemaker placement, Asthma, Arthritis, CHF, Emphysema, hyperlipidemia, hypertension and Morbid Obesity.  Past Medical History:  Diagnosis Date  . Allergic rhinitis   . Anemia    hx due to med  . Arthritis   . Asthma   . CHF (congestive heart failure) ([place])   . Emphysema of lung ([place])   . Hyperlipidemia   . Hypertension   . Pacemaker   . PAT (paroxysmal atrial tachycardia) ([place]) 2008  . Pneumonia    hx  . 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 ABDOMINAL HYSTERECTOMY    . BREAST BIOPSY  04/30/2012   Procedure: BREAST BIOPSY WITH NEEDLE LOCALIZATION;  Surgeon: Rolm Bookbinder, MD;  Location: [facility];  Service: General;  Laterality: Left;  Left breast wire localization biospy  . HEMORRHOID SURGERY    . PACEMAKER INSERTION  02-06-2011  . TAH and BSO    . TONSILLECTOMY      Family History  Problem Relation Age of Onset  . Heart disease Mother   . Cancer Sister        breast   Social History:  reports that she quit smoking about 42 years ago. Her smoking use included cigarettes. She has a 4.00 pack-year smoking history. She has never used smokeless tobacco. She reports that she does not drink alcohol or use drugs.  Allergies:  Allergies  Allergen Reactions  . Albuterol Palpitations and Other (See Comments)    irregular heartbeat    (Not in a hospital admission)   Results for orders placed or performed during the hospital encounter of 01/02/20 (from the  past 48 hour(s))  Basic metabolic panel     Status: Abnormal   Collection Time: 01/02/20  3:23 PM  Result Value Ref Range   Sodium 142 135 - 145 mmol/L   Potassium 3.5 3.5 - 5.1 mmol/L   Chloride 109 98 - 111 mmol/L   CO2 23 22 - 32 mmol/L   Glucose, Bld 103 (H) 70 - 99 mg/dL    Comment: Glucose reference range applies only to samples taken after fasting for at least 8 hours.   BUN 14 8 - 23 mg/dL   Creatinine, Ser 0.98 0.44 - 1.00 mg/dL   Calcium 8.5 (L) 8.9 - 10.3 mg/dL   GFR calc non Af Amer 54 (L) >60 mL/min   GFR calc Af Amer >60 >60 mL/min   Anion gap 10 5 - 15    Comment: Performed at [facility] [address], [place] [address]  CBC     Status: Abnormal   Collection Time: 01/02/20  3:23 PM  Result Value Ref Range   WBC 6.5 4.0 - 10.5 K/uL   RBC 4.07 3.87 - 5.11 MIL/uL   Hemoglobin 11.1 (L) 12.0 - 15.0 g/dL   HCT 36.0 36.0 - 46.0 %  MCV 88.5 80.0 - 100.0 fL   MCH 27.3 26.0 - 34.0 pg   MCHC 30.8 30.0 - 36.0 g/dL   RDW 16.5 (H) 11.5 - 15.5 %   Platelets 220 150 - 400 K/uL   nRBC 0.0 0.0 - 0.2 %    Comment: Performed at [facility] [address], [place], [place] [address]  Troponin I (High Sensitivity)     Status: None   Collection Time: 01/02/20  3:23 PM  Result Value Ref Range   Troponin I (High Sensitivity) 6 <18 ng/L    Comment: (NOTE) Elevated high sensitivity troponin I (hsTnI) values and significant  changes across serial measurements may suggest ACS but many other  chronic and acute conditions are known to elevate hsTnI results.  Refer to the "Links" section for chest pain algorithms and additional  guidance. Performed at [facility], [place] [address] [place], [place] [address]   Protime-INR     Status: Abnormal   Collection Time: 01/02/20  3:25 PM  Result Value Ref Range   Prothrombin Time 20.2 (H) 11.4 - 15.2 seconds   INR 1.8 (H) 0.8 - 1.2    Comment: (NOTE) INR goal varies based on device and disease  states. Performed at [facility], [place] [address] [place], [place] [address]   Brain natriuretic peptide     Status: None   Collection Time: 01/02/20  3:25 PM  Result Value Ref Range   B Natriuretic Peptide 39.5 0.0 - 100.0 pg/mL    Comment: Performed at [facility] [address] [place], [place] [address]   DG Chest 2 View  Result Date: 01/02/2020 CLINICAL DATA:  Worsening shortness of breath EXAM: CHEST - 2 VIEW COMPARISON:  06/04/2019 FINDINGS: Left-sided pacing device as before. Subsegmental atelectasis left base. No pleural effusion. No focal consolidation. Normal heart size. No pneumothorax. IMPRESSION: No active cardiopulmonary disease. Mild atelectasis at the left base. Electronically Signed   By: Donavan Foil M.D.   On: 01/02/2020 16:15    Review Of Systems Constitutional: No fever, chills, weight loss or gain. Eyes: No vision change, wears glasses. No discharge or pain. Ears: No hearing loss, No tinnitus. Respiratory: Positive asthma, COPD, Positive pneumonias. Positive shortness of breath. No hemoptysis. Cardiovascular: Positive chest pain, palpitation, leg edema. Gastrointestinal: No nausea, vomiting, diarrhea, constipation. No GI bleed. No hepatitis. Genitourinary: No dysuria, hematuria, kidney stone. No incontinance. Neurological: Positive headache, no stroke, no seizures.  Psychiatry: No psych facility admission for anxiety, depression, suicide. No detox. Skin: No rash. Musculoskeletal: Positive joint pain, fibromyalgia, neck pain, back pain. Lymphadenopathy: No lymphadenopathy. Hematology: No anemia or easy bruising.   Blood pressure (!) 139/56, pulse (!) 59, temperature 97.9 F (36.6 C), resp. rate 16, height 5\' 7"  (1.702 m), weight 117.9 kg, SpO2 99 %. Body mass index is 40.72 kg/m. General appearance: alert, cooperative, appears stated age and no distress Head: Normocephalic, atraumatic. Eyes: Brown eyes, pink conjunctiva, corneas clear. PERRL,  EOM's intact. She wears glasses. Neck: No adenopathy, no carotid bruit, Positive JVD at 0 degree angle, supple, symmetrical, trachea midline and thyroid not enlarged. Resp: Clearing to auscultation bilaterally. Cardio: Regular rate and rhythm, S1, S2 normal, II/VI systolic murmur, no click, rub or gallop GI: Soft, non-tender; bowel sounds normal; no organomegaly. Extremities: 2 + edema, cyanosis or clubbing. Skin: Warm and dry.  Neurologic: Alert and oriented X 3, normal strength. Normal coordination and slow gait with cane use.  Assessment/Plan Shortness of  breath Chronic left heart diastolic failure HTN Morbid obesity Asthma Paroxysmal atrial fibrillation, CHA2DS2VASc score of 5 Chronic warfarin use Arthritis S/P Pacemaker placement  Place in observation. IV Lasix. Home medications. Echocardiogram tomorrow.  Time spent: Review of old records, Lab, x-rays, EKG, other cardiac tests, examination, discussion with patient over 70 minutes.  Birdie Riddle, MD  01/02/2020, 6:29 PM

## 2020-01-02 NOTE — ED Provider Notes (Addendum)
[facility] EMERGENCY DEPARTMENT Provider Note   CSN: [id number] Arrival date & time: 01/02/20  1510     History Chief Complaint  Patient presents with  . Shortness of Breath    [name] is a 79 y.o. female.  Patient is 79 year old female with a history of CHF, pacemaker placement, obesity, hypertension hyperlipidemia who presents with worsening shortness of breath.  She says she has had worsening shortness of breath for about a month but over the last 2 days is gotten worse.  She has chronic lymphedema and says that that is not really changed from her baseline.  No fevers.  No cough or cold symptoms.  She is having some chest tightness.  She has orthopnea.  She says her breathing is worse at night.  No vomiting or diarrhea.        Past Medical History:  Diagnosis Date  . Allergic rhinitis   . Anemia    hx due to med  . Arthritis   . Asthma   . CHF (congestive heart failure) ([place])   . Emphysema of lung ([place])   . Hyperlipidemia   . Hypertension   . Pacemaker   . PAT (paroxysmal atrial tachycardia) ([place]) 2008  . Pneumonia    hx  . 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 Shortness of breath 01/02/2020  . Congestive heart failure with left ventricular diastolic dysfunction, acute ([place]) 06/03/2019  . Acute on chronic diastolic CHF (congestive heart failure) ([facility]) 06/03/2019  . GERD (gastroesophageal reflux disease) 06/11/2018  . Encounter for therapeutic drug monitoring 05/11/2015  . Tachy-brady syndrome ([place]) 05/11/2015  . Severe obesity (BMI >= 40) ([place]) 05/03/2015  . Essential hypertension 02/03/2015  . Dyspnea 02/02/2015  . Chronic diastolic heart failure ([place]) 01/04/2015  . 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 Complete heart block ([place]) 05/09/2011  . Obesity, Class III, BMI 40-49.9 (morbid obesity) ([facility]) 05/09/2011  . 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 PAROXYSMAL  ATRIAL TACHYCARDIA 06/01/2007  . Allergic rhinitis 06/01/2007  . Mild persistent asthma 06/01/2007    Past Surgical History:  Procedure Laterality Date  . ABDOMINAL HYSTERECTOMY    . BREAST BIOPSY  04/30/2012   Procedure: BREAST BIOPSY WITH NEEDLE LOCALIZATION;  Surgeon: Rolm Bookbinder, MD;  Location: [facility];  Service: General;  Laterality: Left;  Left breast wire localization biospy  . HEMORRHOID SURGERY    . PACEMAKER INSERTION  02-06-2011  . TAH and BSO    . TONSILLECTOMY       OB History   No obstetric history on file.     Family History  Problem Relation Age of Onset  . Heart disease Mother   . Cancer Sister        breast    Social History   Tobacco Use  . Smoking status: Former Smoker    Packs/day: 1.00    Years: 4.00    Pack years: 4.00    Types: Cigarettes    Quit date: 08/07/1977    Years since quitting: 42.4  . Smokeless tobacco: Never Used  Substance Use Topics  . Alcohol use: No    Alcohol/week: 0.0 standard drinks    Comment: quit 1979  . Drug use: No    Home Medications Prior to Admission medications   Medication Sig Start Date End Date Taking? Authorizing Provider  acetaminophen (TYLENOL) 325 MG tablet Take 2  tablets (650 mg total) by mouth every 4 (four) hours as needed for headache or mild pain. 06/05/19   D[place]ie Dials, MD  atorvastatin (LIPITOR) 20 MG tablet Take 20 mg by mouth daily. 04/15/19   [provider]  budesonide-formoterol (SYMBICORT) 80-4.5 MCG/ACT inhaler Inhale 2 puffs into the lungs 2 (two) times daily. 05/24/18   Lauraine Rinne, NP  furosemide (LASIX) 40 MG tablet Take 40 mg by mouth 2 (two) times daily.     [provider]  levalbuterol Penne Lash HFA) 45 MCG/ACT inhaler Inhale 2 puffs into the lungs every 4 (four) hours as needed for wheezing. 05/31/18   Lauraine Rinne, NP  montelukast (SINGULAIR) 10 MG tablet TAKE 1 TABLET BY MOUTH AT BEDTIME EVERY NIGHT Patient taking differently: Take 10 mg by mouth at bedtime.   05/21/18   Lauraine Rinne, NP  omeprazole (PRILOSEC) 20 MG capsule Take 1 capsule (20 mg total) by mouth daily. Take 30-60 min before first meal of the day 05/14/18   Lauraine Rinne, NP  spironolactone (ALDACTONE) 25 MG tablet TAKE ONE TABLET BY MOUTH ONCE DAILY Patient taking differently: Take 25 mg by mouth daily.  05/05/16   Tanda Rockers, MD  valsartan (DIOVAN) 160 MG tablet Take 160 mg by mouth daily.    [provider]  warfarin (COUMADIN) 3 MG tablet TAKE 1 & 1/2-2 TABLETS BY MOUTH DAILY OR AS DIRECTED BY COUMADIN CLINIC Patient taking differently: Take 4.5-6 mg by mouth See admin instructions. Take 6mg  on THUR and SUN and 4.5mg  on all other days. 02/08/18   Evans Lance, MD    Allergies    Albuterol  Review of Systems   Review of Systems  Constitutional: Negative for chills, diaphoresis, fatigue and fever.  HENT: Negative for congestion, rhinorrhea and sneezing.   Eyes: Negative.   Respiratory: Positive for chest tightness and shortness of breath. Negative for cough.   Cardiovascular: Positive for leg swelling. Negative for chest pain.  Gastrointestinal: Negative for abdominal pain, blood in stool, diarrhea, nausea and vomiting.  Genitourinary: Negative for difficulty urinating, flank pain, frequency and hematuria.  Musculoskeletal: Negative for arthralgias and back pain.  Skin: Negative for rash.  Neurological: Negative for dizziness, speech difficulty, weakness, numbness and headaches.    Physical Exam Updated Vital Signs BP (!) 139/56 (BP Location: Right Arm)   Pulse (!) 59   Temp 97.9 F (36.6 C)   Resp 16   Ht 5\' 7"  (1.702 m)   Wt 117.9 kg   SpO2 99%   BMI 40.72 kg/m   Physical Exam Constitutional:      Appearance: She is well-developed.  HENT:     Head: Normocephalic and atraumatic.  Eyes:     Pupils: Pupils are equal, round, and reactive to light.  Cardiovascular:     Rate and Rhythm: Normal rate and regular rhythm.     Heart sounds: Normal heart  sounds.  Pulmonary:     Effort: Pulmonary effort is normal. No respiratory distress.     Breath sounds: Normal breath sounds. No wheezing or rales.  Chest:     Chest wall: No tenderness.  Abdominal:     General: Bowel sounds are normal.     Palpations: Abdomen is soft.     Tenderness: There is no abdominal tenderness. There is no guarding or rebound.  Musculoskeletal:        General: Normal range of motion.     Cervical back: Normal range of motion and  neck supple.     Right lower leg: Edema present.     Left lower leg: Edema present.  Lymphadenopathy:     Cervical: No cervical adenopathy.  Skin:    General: Skin is warm and dry.     Findings: No rash.  Neurological:     Mental Status: She is alert and oriented to person, place, and time.     ED Results / Procedures / Treatments   Labs (all labs ordered are listed, but only abnormal results are displayed) Labs Reviewed  BASIC METABOLIC PANEL - Abnormal; Notable for the following components:      Result Value   Glucose, Bld 103 (*)    Calcium 8.5 (*)    GFR calc non Af Amer 54 (*)    All other components within normal limits  CBC - Abnormal; Notable for the following components:   Hemoglobin 11.1 (*)    RDW 16.5 (*)    All other components within normal limits  PROTIME-INR - Abnormal; Notable for the following components:   Prothrombin Time 20.2 (*)    INR 1.8 (*)    All other components within normal limits  SARS CORONAVIRUS 2 BY RT PCR (HOSPITAL ORDER, [facility])  BRAIN NATRIURETIC PEPTIDE  BASIC METABOLIC PANEL  TROPONIN I (HIGH SENSITIVITY)  TROPONIN I (HIGH SENSITIVITY)    EKG EKG Interpretation  Date/Time:  Friday Jan 02 2020 15:08:13 EDT Ventricular Rate:  66 PR Interval:  174 QRS Duration: 136 QT Interval:  478 QTC Calculation: 501 R Axis:   113 Text Interpretation: Atrial-sensed ventricular-paced rhythm Abnormal ECG since last tracing no significant change Confirmed by  Malvin Johns [id number]) on 01/02/2020 7:01:39 PM   Radiology DG Chest 2 View  Result Date: 01/02/2020 CLINICAL DATA:  Worsening shortness of breath EXAM: CHEST - 2 VIEW COMPARISON:  06/04/2019 FINDINGS: Left-sided pacing device as before. Subsegmental atelectasis left base. No pleural effusion. No focal consolidation. Normal heart size. No pneumothorax. IMPRESSION: No active cardiopulmonary disease. Mild atelectasis at the left base. Electronically Signed   By: Donavan Foil M.D.   On: 01/02/2020 16:15    Procedures Procedures (including critical care time)  Medications Ordered in ED Medications  sodium chloride flush (NS) 0.9 % injection 3 mL (has no administration in time range)  sodium chloride flush (NS) 0.9 % injection 3 mL (has no administration in time range)  sodium chloride flush (NS) 0.9 % injection 3 mL (has no administration in time range)  0.9 %  sodium chloride infusion (has no administration in time range)  furosemide (LASIX) injection 40 mg (has no administration in time range)  acetaminophen (TYLENOL) tablet 650 mg (has no administration in time range)  ondansetron (ZOFRAN) injection 4 mg (has no administration in time range)  atorvastatin (LIPITOR) tablet 20 mg (has no administration in time range)  levalbuterol (XOPENEX HFA) inhaler 2 puff (has no administration in time range)  montelukast (SINGULAIR) tablet 10 mg (has no administration in time range)  pantoprazole (PROTONIX) EC tablet 40 mg (has no administration in time range)  spironolactone (ALDACTONE) tablet 25 mg (has no administration in time range)  irbesartan (AVAPRO) tablet 150 mg (has no administration in time range)  warfarin (COUMADIN) tablet 4.5-6 mg (has no administration in time range)    ED Course  I have reviewed the triage vital signs and the nursing notes.  Pertinent labs & imaging results that were available during my care of the patient were reviewed by me  and considered in my medical decision  making (see chart for details).    MDM Rules/Calculators/A&P                      Patient is a 79 year old female who presents with worsening shortness of breath and orthopnea.  She has some chest tightness.  Her EKG shows a paced rhythm.  Her troponin is negative.  Chest x-ray shows no evidence of edema or pneumonia.  Her symptoms sound consistent with some worsening CHF.  She has had similar symptoms in the past with normal BNP and improved with IV Lasix.  Dr. Doylene Canard actually saw the patient while she was waiting to come back from triage and has written admission orders on the patient. Final Clinical Impression(s) / ED Diagnoses Final diagnoses:  SOB (shortness of breath)    Rx / DC Orders ED Discharge Orders    None       Malvin Johns, MD 01/02/20 Remus Loffler    Malvin Johns, MD 01/02/20 [phone]

## 2020-01-03 ENCOUNTER — Observation Stay (HOSPITAL_COMMUNITY): Payer: Medicare HMO

## 2020-01-03 ENCOUNTER — Other Ambulatory Visit: Payer: Self-pay

## 2020-01-03 LAB — PROTIME-INR
INR: 1.7 — ABNORMAL HIGH (ref 0.8–1.2)
Prothrombin Time: 19 seconds — ABNORMAL HIGH (ref 11.4–15.2)

## 2020-01-03 LAB — BASIC METABOLIC PANEL
Anion gap: 9 (ref 5–15)
BUN: 9 mg/dL (ref 8–23)
CO2: 27 mmol/L (ref 22–32)
Calcium: 8.3 mg/dL — ABNORMAL LOW (ref 8.9–10.3)
Chloride: 107 mmol/L (ref 98–111)
Creatinine, Ser: 0.9 mg/dL (ref 0.44–1.00)
GFR calc Af Amer: >60 mL/min (ref >60)
GFR calc non Af Amer: 60 mL/min — ABNORMAL LOW (ref >60)
Glucose, Bld: 103 mg/dL — ABNORMAL HIGH (ref 70–99)
Potassium: 3.3 mmol/L — ABNORMAL LOW (ref 3.5–5.1)
Sodium: 143 mmol/L (ref 135–145)

## 2020-01-03 LAB — ECHOCARDIOGRAM COMPLETE
Height: 67.5 in
Weight: 4056.46 oz

## 2020-01-03 MED ORDER — WARFARIN SODIUM 5 MG PO TABS
6.0000 mg | ORAL_TABLET | Freq: Every day | ORAL | Status: DC
  Administered 2020-01-03 – 2020-01-04 (×2): 6 mg via ORAL
  Filled 2020-01-03 (×2): qty 1

## 2020-01-03 MED ORDER — POTASSIUM CHLORIDE CRYS ER 10 MEQ PO TBCR
20.0000 meq | EXTENDED_RELEASE_TABLET | Freq: Three times a day (TID) | ORAL | Status: DC
  Administered 2020-01-03 – 2020-01-05 (×7): 20 meq via ORAL
  Filled 2020-01-03 (×10): qty 2

## 2020-01-03 NOTE — Care Management Obs Status (Signed)
[facility] NOTIFICATION   Patient Details  Name: [name] MRN: [id number] Date of Birth: Nov 29, 1940   Medicare Observation Status Notification Given:  Yes Note read to patient, patient consents to verbal sign that she received explanation. Tubed to 6E  To bring to room for patient.    Verdell Carmine, RN 01/03/2020, 3:00 PM

## 2020-01-03 NOTE — Plan of Care (Signed)

## 2020-01-03 NOTE — Discharge Instructions (Addendum)
Orders for bedside commode , 3 in 1 , rollator and hospital bed. DME agencies closed today. [name] on call for [employer], he will arrange for delivery. Patient is aware delivery will not be today.   Information on my medicine - Coumadin   (Warfarin)  This medication education was reviewed with me or my healthcare representative as part of my discharge preparation.  The pharmacist that spoke with me during my hospital stay was:  Werner Lean, [facility]  Why was Coumadin prescribed for you? Coumadin was prescribed for you because you have a blood clot or a medical condition that can cause an increased risk of forming blood clots. Blood clots can cause serious health problems by blocking the flow of blood to the heart, lung, or brain. Coumadin can prevent harmful blood clots from forming. As a reminder your indication for Coumadin is:   Stroke Prevention Because Of Atrial Fibrillation  What test will check on my response to Coumadin? While on Coumadin (warfarin) you will need to have an INR test regularly to ensure that your dose is keeping you in the desired range. The INR (international normalized ratio) number is calculated from the result of the laboratory test called prothrombin time (PT).  If an INR APPOINTMENT HAS NOT ALREADY BEEN MADE FOR YOU please schedule an appointment to have this lab work done by your health care provider within 7 days. Your INR goal is usually a number between:  2 to 3 or your provider may give you a more narrow range like 2-2.5.  Ask your health care provider during an office visit what your goal INR is.  What  do you need to  know  About  COUMADIN? Take Coumadin (warfarin) exactly as prescribed by your healthcare provider about the same time each day.  DO NOT stop taking without talking to the doctor who prescribed the medication.  Stopping without other blood clot prevention medication to take the place of Coumadin may increase your risk of developing a new clot or  stroke.  Get refills before you run out.  What do you do if you miss a dose? If you miss a dose, take it as soon as you remember on the same day then continue your regularly scheduled regimen the next day.  Do not take two doses of Coumadin at the same time.  Important Safety Information A possible side effect of Coumadin (Warfarin) is an increased risk of bleeding. You should call your healthcare provider right away if you experience any of the following: ? Bleeding from an injury or your nose that does not stop. ? Unusual colored urine (red or dark brown) or unusual colored stools (red or black). ? Unusual bruising for unknown reasons. ? A serious fall or if you hit your head (even if there is no bleeding).  Some foods or medicines interact with Coumadin (warfarin) and might alter your response to warfarin. To help avoid this: ? Eat a balanced diet, maintaining a consistent amount of Vitamin K. ? Notify your provider about major diet changes you plan to make. ? Avoid alcohol or limit your intake to 1 drink for women and 2 drinks for men per day. (1 drink is 5 oz. wine, 12 oz. beer, or 1.5 oz. liquor.)  Make sure that ANY health care provider who prescribes medication for you knows that you are taking Coumadin (warfarin).  Also make sure the healthcare provider who is monitoring your Coumadin knows when you have started a new medication  including herbals and non-prescription products.  Coumadin (Warfarin)  Major Drug Interactions  Increased Warfarin Effect Decreased Warfarin Effect  Alcohol (large quantities) Antibiotics (esp. Septra/Bactrim, Flagyl, Cipro) Amiodarone (Cordarone) Aspirin (ASA) Cimetidine (Tagamet) Megestrol (Megace) NSAIDs (ibuprofen, naproxen, etc.) Piroxicam (Feldene) Propafenone (Rythmol SR) Propranolol (Inderal) Isoniazid (INH) Posaconazole (Noxafil) Barbiturates (Phenobarbital) Carbamazepine (Tegretol) Chlordiazepoxide (Librium) Cholestyramine  (Questran) Griseofulvin Oral Contraceptives Rifampin Sucralfate (Carafate) Vitamin K   Coumadin (Warfarin) Major Herbal Interactions  Increased Warfarin Effect Decreased Warfarin Effect  Garlic Ginseng Ginkgo biloba Coenzyme Q10 Green tea St. John's wort    Coumadin (Warfarin) FOOD Interactions  Eat a consistent number of servings per week of foods HIGH in Vitamin K (1 serving =  cup)  Collards (cooked, or boiled & drained) Kale (cooked, or boiled & drained) Mustard greens (cooked, or boiled & drained) Parsley *serving size only =  cup Spinach (cooked, or boiled & drained) Swiss chard (cooked, or boiled & drained) Turnip greens (cooked, or boiled & drained)  Eat a consistent number of servings per week of foods MEDIUM-HIGH in Vitamin K (1 serving = 1 cup)  Asparagus (cooked, or boiled & drained) Broccoli (cooked, boiled & drained, or raw & chopped) Brussel sprouts (cooked, or boiled & drained) *serving size only =  cup Lettuce, raw (green leaf, endive, romaine) Spinach, raw Turnip greens, raw & chopped   These websites have more information on Coumadin (warfarin):  FailFactory.se; VeganReport.com.au;

## 2020-01-03 NOTE — Plan of Care (Signed)
  Problem: Clinical Measurements: Goal: Respiratory complications will improve Outcome: Progressing Goal: Cardiovascular complication will be avoided Outcome: Progressing   Problem: Elimination: Goal: Will not experience complications related to urinary retention Outcome: Progressing   

## 2020-01-03 NOTE — Progress Notes (Signed)
Ref: Charolette Forward, MD   Subjective:  Improving breathing. Low potassium and INR this AM. Afebrile.  Echocardiogram shows mild systolic and diastolic LV dysfunction with mild MR and moderate TR.  Objective:  Vital Signs in the last 24 hours: Temp:  [97.6 F (36.4 C)-98.8 F (37.1 C)] 98.2 F (36.8 C) (05/29 0725) Pulse Rate:  [59-97] 80 (05/29 0836) Cardiac Rhythm: Ventricular paced (05/29 0700) Resp:  [13-25] 17 (05/29 0532) BP: (111-147)/(50-85) 116/55 (05/29 0836) SpO2:  [97 %-100 %] 100 % (05/29 0532) Weight:  [id number] kg-117.9 kg] 115 kg (05/29 0622)  Physical Exam: BP Readings from Last 1 Encounters:  01/03/20 (!) 116/55     Wt Readings from Last 1 Encounters:  01/03/20 115 kg    Weight change:  Body mass index is 39.12 kg/m. HEENT: [place]/AT, Eyes-Brown, PERL, EOMI, Conjunctiva-Pink, Sclera-Non-icteric Neck: No JVD, No bruit, Trachea midline. Lungs:  Clearing, Bilateral. Cardiac:  Regular rhythm, normal S1 and S2, no S3. II/VI systolic murmur. Abdomen:  Soft, non-tender. BS present. Extremities:  2 + edema present. No cyanosis. No clubbing. CNS: AxOx3, Cranial nerves grossly intact, moves all 4 extremities.  Skin: Warm and dry.   Intake/Output from previous day: 05/28 0701 - 05/29 0700 In: 363 [P.O.:360; I.V.:3] Out: 1900 [Urine:1900]    Lab Results: BMET    Component Value Date/Time   NA 143 01/03/2020 0754   NA 142 01/02/2020 1523   NA 139 06/05/2019 0551   K 3.3 (L) 01/03/2020 0754   K 3.5 01/02/2020 1523   K 3.4 (L) 06/05/2019 0551   CL 107 01/03/2020 0754   CL 109 01/02/2020 1523   CL 106 06/05/2019 0551   CO2 27 01/03/2020 0754   CO2 23 01/02/2020 1523   CO2 25 06/05/2019 0551   GLUCOSE 103 (H) 01/03/2020 0754   GLUCOSE 103 (H) 01/02/2020 1523   GLUCOSE 96 06/05/2019 0551   BUN 9 01/03/2020 0754   BUN 14 01/02/2020 1523   BUN 7 (L) 06/05/2019 0551   CREATININE 0.90 01/03/2020 0754   CREATININE 0.98 01/02/2020 1523   CREATININE 1.03 (H)  06/05/2019 0551   CREATININE 1.08 05/05/2011 1536   CALCIUM 8.3 (L) 01/03/2020 0754   CALCIUM 8.5 (L) 01/02/2020 1523   CALCIUM 8.1 (L) 06/05/2019 0551   GFRNONAA 60 (L) 01/03/2020 0754   GFRNONAA 54 (L) 01/02/2020 1523   GFRNONAA 51 (L) 06/05/2019 0551   GFRNONAA 50 (L) 05/05/2011 1536   GFRAA >60 01/03/2020 0754   GFRAA >60 01/02/2020 1523   GFRAA 59 (L) 06/05/2019 0551   GFRAA 60 (L) 05/05/2011 1536   CBC    Component Value Date/Time   WBC 6.5 01/02/2020 1523   RBC 4.07 01/02/2020 1523   HGB 11.1 (L) 01/02/2020 1523   HCT 36.0 01/02/2020 1523   PLT 220 01/02/2020 1523   MCV 88.5 01/02/2020 1523   MCH 27.3 01/02/2020 1523   MCHC 30.8 01/02/2020 1523   RDW 16.5 (H) 01/02/2020 1523   LYMPHSABS 2.7 06/03/2019 2024   MONOABS 0.7 06/03/2019 2024   EOSABS 0.3 06/03/2019 2024   BASOSABS 0.1 06/03/2019 2024   HEPATIC Function Panel Recent Labs    06/03/19 2024  PROT 6.7   HEMOGLOBIN A1C No components found for: HGA1C,  MPG CARDIAC ENZYMES Lab Results  Component Value Date   TROPONINI <0.03 01/05/2015   TROPONINI <0.03 01/04/2015   TROPONINI <0.03 01/04/2015   BNP No results for input(s): PROBNP in the last 8760 hours. TSH Recent Labs  06/03/19 2024  TSH 1.719   CHOLESTEROL No results for input(s): CHOL in the last 8760 hours.  Scheduled Meds: . atorvastatin  20 mg Oral Daily  . furosemide  40 mg Intravenous Q12H  . irbesartan  150 mg Oral Daily  . montelukast  10 mg Oral QHS  . pantoprazole  40 mg Oral Daily  . sodium chloride flush  3 mL Intravenous Once  . sodium chloride flush  3 mL Intravenous Q12H  . spironolactone  25 mg Oral Daily  . warfarin  4.5 mg Oral Once per day on Mon Tue Wed Fri Sat  . [START ON 01/04/2020] warfarin  6 mg Oral Once per day on Sun Thu  . Warfarin - Physician Dosing Inpatient   Does not apply q1600   Continuous Infusions: . 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 Increase warfarin dose. Continue diuresis.Increase activity.   LOS: 0 days   Time spent including chart review, lab review, examination, discussion with patient : 30 min   Dixie Dials  MD  01/03/2020, 10:32 AM

## 2020-01-03 NOTE — Progress Notes (Signed)
  Echocardiogram 2D Echocardiogram has been performed.  [name] 01/03/2020, 9:44 AM

## 2020-01-04 DIAGNOSIS — Z90710 Acquired absence of both cervix and uterus: Secondary | ICD-10-CM | POA: Diagnosis not present

## 2020-01-04 DIAGNOSIS — Z87891 Personal history of nicotine dependence: Secondary | ICD-10-CM | POA: Diagnosis not present

## 2020-01-04 DIAGNOSIS — Z8701 Personal history of pneumonia (recurrent): Secondary | ICD-10-CM | POA: Diagnosis not present

## 2020-01-04 DIAGNOSIS — J45909 Unspecified asthma, uncomplicated: Secondary | ICD-10-CM | POA: Diagnosis present

## 2020-01-04 DIAGNOSIS — Z950 Presence of cardiac pacemaker: Secondary | ICD-10-CM | POA: Diagnosis not present

## 2020-01-04 DIAGNOSIS — J439 Emphysema, unspecified: Secondary | ICD-10-CM | POA: Diagnosis present

## 2020-01-04 DIAGNOSIS — Z7951 Long term (current) use of inhaled steroids: Secondary | ICD-10-CM | POA: Diagnosis not present

## 2020-01-04 DIAGNOSIS — E876 Hypokalemia: Secondary | ICD-10-CM | POA: Diagnosis present

## 2020-01-04 DIAGNOSIS — M1990 Unspecified osteoarthritis, unspecified site: Secondary | ICD-10-CM | POA: Diagnosis present

## 2020-01-04 DIAGNOSIS — I5042 Chronic combined systolic (congestive) and diastolic (congestive) heart failure: Secondary | ICD-10-CM | POA: Diagnosis present

## 2020-01-04 DIAGNOSIS — I959 Hypotension, unspecified: Secondary | ICD-10-CM | POA: Diagnosis not present

## 2020-01-04 DIAGNOSIS — R0602 Shortness of breath: Secondary | ICD-10-CM | POA: Diagnosis present

## 2020-01-04 DIAGNOSIS — Z79899 Other long term (current) drug therapy: Secondary | ICD-10-CM | POA: Diagnosis not present

## 2020-01-04 DIAGNOSIS — I480 Paroxysmal atrial fibrillation: Secondary | ICD-10-CM | POA: Diagnosis present

## 2020-01-04 DIAGNOSIS — Z6841 Body Mass Index (BMI) 40.0 and over, adult: Secondary | ICD-10-CM | POA: Diagnosis not present

## 2020-01-04 DIAGNOSIS — E785 Hyperlipidemia, unspecified: Secondary | ICD-10-CM | POA: Diagnosis present

## 2020-01-04 DIAGNOSIS — Z7901 Long term (current) use of anticoagulants: Secondary | ICD-10-CM | POA: Diagnosis not present

## 2020-01-04 DIAGNOSIS — Z20822 Contact with and (suspected) exposure to covid-19: Secondary | ICD-10-CM | POA: Diagnosis present

## 2020-01-04 DIAGNOSIS — I890 Lymphedema, not elsewhere classified: Secondary | ICD-10-CM | POA: Diagnosis present

## 2020-01-04 DIAGNOSIS — Z8249 Family history of ischemic heart disease and other diseases of the circulatory system: Secondary | ICD-10-CM | POA: Diagnosis not present

## 2020-01-04 DIAGNOSIS — Z888 Allergy status to other drugs, medicaments and biological substances status: Secondary | ICD-10-CM | POA: Diagnosis not present

## 2020-01-04 DIAGNOSIS — I110 Hypertensive heart disease with heart failure: Secondary | ICD-10-CM | POA: Diagnosis present

## 2020-01-04 LAB — BASIC METABOLIC PANEL
Anion gap: 10 (ref 5–15)
BUN: 12 mg/dL (ref 8–23)
CO2: 24 mmol/L (ref 22–32)
Calcium: 8 mg/dL — ABNORMAL LOW (ref 8.9–10.3)
Chloride: 106 mmol/L (ref 98–111)
Creatinine, Ser: 0.97 mg/dL (ref 0.44–1.00)
GFR calc Af Amer: >60 mL/min (ref >60)
GFR calc non Af Amer: 55 mL/min — ABNORMAL LOW (ref >60)
Glucose, Bld: 91 mg/dL (ref 70–99)
Potassium: 3.6 mmol/L (ref 3.5–5.1)
Sodium: 140 mmol/L (ref 135–145)

## 2020-01-04 LAB — PROTIME-INR
INR: 1.8 — ABNORMAL HIGH (ref 0.8–1.2)
Prothrombin Time: 20.3 seconds — ABNORMAL HIGH (ref 11.4–15.2)

## 2020-01-04 NOTE — Progress Notes (Signed)
   01/04/20 0628  Assess: MEWS Score  BP (!) 88/55  Pulse Rate (!) 25  ECG Heart Rate 60  Resp 13  SpO2 98 %  Assess: MEWS Score  MEWS Temp 0  MEWS Systolic 1  MEWS Pulse 0  MEWS RR 1  MEWS LOC 0  MEWS Score 2  MEWS Score Color Yellow  Assess: if the MEWS score is Yellow or Red  Were vital signs taken at a resting state? Yes  Focused Assessment Documented focused assessment  Early Detection of Sepsis Score *See Row Information* Low  MEWS guidelines implemented *See Row Information* Yes  Treat  MEWS Interventions Escalated (See documentation below)  Take Vital Signs  Increase Vital Sign Frequency  Yellow: Q 2hr X 2 then Q 4hr X 2, if remains yellow, continue Q 4hrs  Escalate  MEWS: Escalate Yellow: discuss with charge nurse/RN and consider discussing with provider and RRT  Notify: Charge Nurse/RN  Name of Charge Nurse/RN Notified Yetta Glassman, RN  Date Charge Nurse/RN Notified 01/04/20  Time Charge Nurse/RN Notified 0636  Notify: Provider  Provider Name/Title Dr. Doylene Canard  Date Provider Notified 01/04/20  Time Provider Notified [phone]  Notification Type Page  Notification Reason Change in status (Low BP; patient feeling dizzy and nauseous)  Response See new orders  Date of Provider Response 01/04/20  Time of Provider Response [id number]  Document  Patient Outcome Stabilized after interventions (BP up to 115/50 after patient laid down in bed)  Progress note created (see row info) Yes

## 2020-01-04 NOTE — Progress Notes (Addendum)
Ref: Charolette Forward, MD   Subjective:  Awake. Episode of low BP last night.  She was orthostatic with dizziness and nausea when standing. Furosemide and BP medications are held. BP improved now.  Patient feels weak for discharge today. Hypokalemia is improved.  Objective:  Vital Signs in the last 24 hours: Temp:  [98 F (36.7 C)-98.4 F (36.9 C)] 98.1 F (36.7 C) (05/30 0836) Pulse Rate:  [25-70] 59 (05/30 0639) Cardiac Rhythm: A-V Sequential paced (05/30 0837) Resp:  [13-19] 15 (05/30 0836) BP: (88-138)/(39-62) 138/58 (05/30 0836) SpO2:  [97 %-100 %] 100 % (05/30 0836) Weight:  [id number] kg] 113 kg (05/30 0413)  Physical Exam: BP Readings from Last 1 Encounters:  01/04/20 (!) 138/58     Wt Readings from Last 1 Encounters:  01/04/20 113 kg    Weight change: -4.899 kg Body mass index is 38.45 kg/m. HEENT: /AT, Eyes-Brown, PERL, EOMI, Conjunctiva-Pink, Sclera-Non-icteric Neck: No JVD, No bruit, Trachea midline. Lungs:  Clear, Bilateral. Cardiac:  Regular rhythm, normal S1 and S2, no S3. II/VI systolic murmur. Abdomen:  Soft, non-tender. BS present. Extremities:  Trace edema present. No cyanosis. No clubbing. Chronic left lower leg lymphedema and venous stasis dermatosis. CNS: AxOx3, Cranial nerves grossly intact, moves all 4 extremities.  Skin: Warm and dry.   Intake/Output from previous day: 05/29 0701 - 05/30 0700 In: 963 [P.O.:960; I.V.:3] Out: 1200 [Urine:1200]    Lab Results: BMET    Component Value Date/Time   NA 140 01/04/2020 0415   NA 143 01/03/2020 0754   NA 142 01/02/2020 1523   K 3.6 01/04/2020 0415   K 3.3 (L) 01/03/2020 0754   K 3.5 01/02/2020 1523   CL 106 01/04/2020 0415   CL 107 01/03/2020 0754   CL 109 01/02/2020 1523   CO2 24 01/04/2020 0415   CO2 27 01/03/2020 0754   CO2 23 01/02/2020 1523   GLUCOSE 91 01/04/2020 0415   GLUCOSE 103 (H) 01/03/2020 0754   GLUCOSE 103 (H) 01/02/2020 1523   BUN 12 01/04/2020 0415   BUN 9 01/03/2020 0754    BUN 14 01/02/2020 1523   CREATININE 0.97 01/04/2020 0415   CREATININE 0.90 01/03/2020 0754   CREATININE 0.98 01/02/2020 1523   CREATININE 1.08 05/05/2011 1536   CALCIUM 8.0 (L) 01/04/2020 0415   CALCIUM 8.3 (L) 01/03/2020 0754   CALCIUM 8.5 (L) 01/02/2020 1523   GFRNONAA 55 (L) 01/04/2020 0415   GFRNONAA 60 (L) 01/03/2020 0754   GFRNONAA 54 (L) 01/02/2020 1523   GFRNONAA 50 (L) 05/05/2011 1536   GFRAA >60 01/04/2020 0415   GFRAA >60 01/03/2020 0754   GFRAA >60 01/02/2020 1523   GFRAA 60 (L) 05/05/2011 1536   CBC    Component Value Date/Time   WBC 6.5 01/02/2020 1523   RBC 4.07 01/02/2020 1523   HGB 11.1 (L) 01/02/2020 1523   HCT 36.0 01/02/2020 1523   PLT 220 01/02/2020 1523   MCV 88.5 01/02/2020 1523   MCH 27.3 01/02/2020 1523   MCHC 30.8 01/02/2020 1523   RDW 16.5 (H) 01/02/2020 1523   LYMPHSABS 2.7 06/03/2019 2024   MONOABS 0.7 06/03/2019 2024   EOSABS 0.3 06/03/2019 2024   BASOSABS 0.1 06/03/2019 2024   HEPATIC Function Panel Recent Labs    06/03/19 2024  PROT 6.7   HEMOGLOBIN A1C No components found for: HGA1C,  MPG CARDIAC ENZYMES Lab Results  Component Value Date   TROPONINI <0.03 01/05/2015   TROPONINI <0.03 01/04/2015   TROPONINI <0.03 01/04/2015  BNP No results for input(s): PROBNP in the last 8760 hours. TSH Recent Labs    06/03/19 2024  TSH 1.719   CHOLESTEROL No results for input(s): CHOL in the last 8760 hours.  Scheduled Meds: . atorvastatin  20 mg Oral Daily  . irbesartan  150 mg Oral Daily  . montelukast  10 mg Oral QHS  . pantoprazole  40 mg Oral Daily  . potassium chloride  20 mEq Oral TID  . sodium chloride flush  3 mL Intravenous Q12H  . spironolactone  25 mg Oral Daily  . warfarin  6 mg Oral q1600  . Warfarin - Physician Dosing Inpatient   Does not apply q1600   Continuous Infusions: . 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 IV fluids not given due to underlying CHF. Increase activity. Discharge tomorrow if stable.   LOS: 0 days   Time spent including chart review, lab review, examination, discussion with patient : 30 min   Dixie Dials  MD  01/04/2020, 12:11 PM

## 2020-01-05 LAB — PROTIME-INR
INR: 2 — ABNORMAL HIGH (ref 0.8–1.2)
Prothrombin Time: 22.2 seconds — ABNORMAL HIGH (ref 11.4–15.2)

## 2020-01-05 LAB — BASIC METABOLIC PANEL WITH GFR
Anion gap: 6 (ref 5–15)
BUN: 10 mg/dL (ref 8–23)
CO2: 27 mmol/L (ref 22–32)
Calcium: 8.2 mg/dL — ABNORMAL LOW (ref 8.9–10.3)
Chloride: 108 mmol/L (ref 98–111)
Creatinine, Ser: 1.04 mg/dL — ABNORMAL HIGH (ref 0.44–1.00)
GFR calc Af Amer: 58 mL/min — ABNORMAL LOW
GFR calc non Af Amer: 50 mL/min — ABNORMAL LOW
Glucose, Bld: 96 mg/dL (ref 70–99)
Potassium: 4.3 mmol/L (ref 3.5–5.1)
Sodium: 141 mmol/L (ref 135–145)

## 2020-01-05 MED ORDER — WARFARIN SODIUM 5 MG PO TABS: 5.0000 mg | ORAL_TABLET | Freq: Every day | ORAL | 3 refills | Status: DC

## 2020-01-05 MED ORDER — WARFARIN SODIUM 5 MG PO TABS: 5.0000 mg | ORAL_TABLET | Freq: Every day | ORAL | Status: DC

## 2020-01-05 MED ORDER — FUROSEMIDE 40 MG PO TABS: 40.0000 mg | ORAL_TABLET | Freq: Every day | ORAL | Status: DC

## 2020-01-05 MED ORDER — POTASSIUM CHLORIDE CRYS ER 20 MEQ PO TBCR: 20.0000 meq | EXTENDED_RELEASE_TABLET | Freq: Every day | ORAL | 1 refills | Status: DC

## 2020-01-05 NOTE — Plan of Care (Signed)

## 2020-01-05 NOTE — Discharge Summary (Signed)
Physician Discharge Summary  Patient ID: Analiese Demartini MRN: [id number] DOB/AGE: 1940/10/06 79 y.o.  Admit date: 01/02/2020 Discharge date: 01/05/2020  Admission Diagnoses: Shortness of breath Chronic left heart diastolic failure HTN Morbid obesity Asthma Paroxysmal atrial fibrillation, CHA2DS2VASc score of 5 Chronic warfarin use Arthritis S/P Pacemaker placement  Discharge Diagnoses:  Principle diagnosis: Chronic left heart diastolic failure Active Problems:   Hypertension   Shortness of breath   Asthma   Morbid obesity   Paroxysmal atrial fibrillation, CHA2DS2VASc score of 5   Dizziness   Chronic warfarin use   Arthritis   S/P pacemaker placement    Discharged Condition: fair  Hospital Course: 80 years old black female presented with shortness of breath x 1 month. She has chronic bilateral leg edema, orthopnea with PMH of paroxysmal atrial fibrillation, s/p permanent pacemaker placement, Asthma, Arthritis, CHF, emphysema, Hyperlipidemia, hypertension and morbid obesity. She was treated with IV lasix and feeling better but on 01/03/2020 she had episode of hypotension with weakness and dizziness. Her IV furosemide was discontinued and her BP medications were held for 1 day. Oral lasix was reduced to once daily and potassium was decreased to one daily. Warfarin dose was simplified to 5 mg. q 6 PM. She will have home health RN, PT and aide to assist with her daily living activity. Patient was advised dietary modifications and decreased calorie intake. She will see Dr. Terrence Dupont in 1 week and Dr. Beckie Salts for pacemaker follow up as arranged.  Consults: cardiology  Significant Diagnostic Studies: labs: Near normal BMET, CBC, BNP, Troponin I.  Echocardiogram: Preserved LV systolic function with mild diastolic dysfunction, mild to moderate MR and moderate TR.  EKG: A sensed, V paced rhythm.  Chest x-ray: No acute disease.  Treatments: cardiac meds: Atorvastatin, Warfarin,  valsartan, spironolactone, furosemide and potassium  Discharge Exam: Blood pressure (!) 119/53, pulse 75, temperature 98.2 F (36.8 C), temperature source Oral, resp. rate 15, height 5' 7.5" (1.715 m), weight 114.3 kg, SpO2 95 %. General appearance: alert, cooperative and appears stated age. Head: Normocephalic, atraumatic. Eyes: Brown eyes, pink conjunctiva, corneas clear. PERRL, EOM's intact.  Neck: No adenopathy, no carotid bruit, no JVD, supple, symmetrical, trachea midline and thyroid not enlarged. Resp: Clearing to auscultation bilaterally. Cardio: Regular rate and rhythm, S1, S2 normal, II/VI systolic murmur, no click, rub or gallop. GI: Soft, non-tender; bowel sounds normal; no organomegaly. Extremities: Trace edema, cyanosis or clubbing. Skin: Warm and dry.  Neurologic: Alert and oriented X 3, normal strength and tone. Normal coordination and slow gait.  Disposition: Discharge disposition: 01-Home or Self Care        Allergies as of 01/05/2020      Reactions   Albuterol Palpitations, Other (See Comments)   irregular heartbeat      Medication List    TAKE these medications   acetaminophen 325 MG tablet Commonly known as: TYLENOL Take 2 tablets (650 mg total) by mouth every 4 (four) hours as needed for headache or mild pain.   atorvastatin 20 MG tablet Commonly known as: LIPITOR Take 20 mg by mouth daily.   budesonide-formoterol 80-4.5 MCG/ACT inhaler Commonly known as: Symbicort Inhale 2 puffs into the lungs 2 (two) times daily.   furosemide 40 MG tablet Commonly known as: LASIX Take 1 tablet (40 mg total) by mouth daily. What changed: when to take this   levalbuterol 45 MCG/ACT inhaler Commonly known as: Xopenex HFA Inhale 2 puffs into the lungs every 4 (four) hours as needed for wheezing.  montelukast 10 MG tablet Commonly known as: SINGULAIR TAKE 1 TABLET BY MOUTH AT BEDTIME EVERY NIGHT What changed:   how much to take  how to take this  when  to take this  additional instructions   omeprazole 20 MG capsule Commonly known as: PRILOSEC Take 1 capsule (20 mg total) by mouth daily. Take 30-60 min before first meal of the day   potassium chloride SA 20 MEQ tablet Commonly known as: KLOR-CON Take 1 tablet (20 mEq total) by mouth daily.   spironolactone 25 MG tablet Commonly known as: ALDACTONE TAKE ONE TABLET BY MOUTH ONCE DAILY   valsartan 160 MG tablet Commonly known as: DIOVAN Take 160 mg by mouth daily.   warfarin 5 MG tablet Commonly known as: COUMADIN Take as directed. If you are unsure how to take this medication, talk to your nurse or doctor. Original instructions: Take 1 tablet (5 mg total) by mouth daily at 4 PM. What changed:   medication strength  how much to take  how to take this  when to take this  additional instructions      Follow-up Information    Charolette Forward, MD Follow up.   Specialty: Cardiology Contact information: [address] [place] [address] [phone]        Evans Lance, MD .   Specialty: Cardiology Contact information: [phone] [address] [phone]           Time spent: Review of old chart, current chart, lab, x-ray, cardiac tests and discussion with patient over 60 minutes.  Signed: Birdie Riddle 01/05/2020, 10:57 AM

## 2020-01-05 NOTE — TOC Initial Note (Addendum)
Transition of Care [facility]) - Initial/Assessment Note    Patient Details  Name: [name] MRN: [id number] Date of Birth: [birth year]  Transition of Care [facility]) CM/SW Contact:    Marilu Favre, RN Phone Number: 01/05/2020, 11:46 AM  Clinical Narrative:                  Rozanna Boer daughter currently staying with patient.Patient requesting a 3 in1 . DME agencies closed today.   Orders for bedside commode , 3 in 1 , rollator and hospital bed. DME agencies closed today. [name] on call for [employer], he will arrange for delivery. Patient aware delivery will not be today.      Expected Discharge Plan: [place] Barriers to Discharge: No Barriers Identified   Patient Goals and CMS Choice Patient states their goals for this hospitalization and ongoing recovery are:: to return to home CMS Medicare.gov Compare Post Acute Care list provided to:: Patient Choice offered to / list presented to : Patient  Expected Discharge Plan and Services Expected Discharge Plan: [place]   Discharge Planning Services: CM Consult Post Acute Care Choice: [place] arrangements for the past 2 months: Single Family Home Expected Discharge Date: 01/05/20               DME Arranged: N/A DME Agency: NA       HH Arranged: PT, OT, Nurse's Aide [place] Agency: [place] [facility] Agency Contacted: 01/05/20 Time HH Agency Contacted: [id number] Representative spoke with at [place]: Tommi Rumps  Prior Living Arrangements/Services Living arrangements for the past 2 months: [place] Lives with:: Relatives Patient language and need for interpreter reviewed:: Yes Do you feel safe going back to the place where you live?: Yes      Need for Family Participation in Patient Care: Yes (Comment) Care giver support system in place?: Yes (comment) Current home services: Home RN, Home PT, Homehealth aide Criminal Activity/Legal Involvement Pertinent to Current  Situation/Hospitalization: No - Comment as needed  Activities of Daily Living Home Assistive Devices/Equipment: Dentures (specify type), Eyeglasses, Scales, Cane (specify quad or straight), Blood pressure cuff ADL Screening (condition at time of admission) Patient's cognitive ability adequate to safely complete daily activities?: Yes Is the patient deaf or have difficulty hearing?: No Does the patient have difficulty seeing, even when wearing glasses/contacts?: No Does the patient have difficulty concentrating, remembering, or making decisions?: No Patient able to express need for assistance with ADLs?: Yes Does the patient have difficulty dressing or bathing?: No Independently performs ADLs?: Yes (appropriate for developmental age) Does the patient have difficulty walking or climbing stairs?: Yes(states she has moved to the lower level of her home to avoid stairs) Weakness of Legs: None Weakness of Arms/Hands: None  Permission Sought/Granted   Permission granted to share information with : No              Emotional Assessment Appearance:: Appears stated age Attitude/Demeanor/Rapport: Engaged Affect (typically observed): Accepting Orientation: : Oriented to Self, Oriented to Place, Oriented to  Time, Oriented to Situation Alcohol / Substance Use: Not Applicable Psych Involvement: No (comment)  Admission diagnosis:  Shortness of breath [R06.02] SOB (shortness of breath) 0000000 Chronic diastolic heart failure (HCC) [I50.32] Patient Active Problem List   Diagnosis Date Noted  . Shortness of breath 01/02/2020  . Congestive heart failure with left ventricular diastolic dysfunction, acute ([place]) 06/03/2019  . Acute on chronic diastolic CHF (congestive heart failure) (  [place]) 06/03/2019  . GERD (gastroesophageal reflux disease) 06/11/2018  . Encounter for therapeutic drug monitoring 05/11/2015  . Tachy-brady syndrome ([place]) 05/11/2015  . Severe obesity (BMI >= 40) ([place]) 05/03/2015   . Essential hypertension 02/03/2015  . Dyspnea 02/02/2015  . Chronic diastolic heart failure ([place]) 01/04/2015  . 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 Complete heart block ([place]) 05/09/2011  . Obesity, Class III, BMI 40-49.9 (morbid obesity) ([place]) 05/09/2011  . 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 PAROXYSMAL ATRIAL TACHYCARDIA 06/01/2007  . Allergic rhinitis 06/01/2007  . Mild persistent asthma 06/01/2007   PCP:  Charolette Forward, MD Pharmacy:   [facility] [address] ([address]), [place] - [id number] [place] ([place]) [place] [address] Phone: [phone] Fax: [phone]  [facility] - [place], [place] [address] Phone: [phone] Fax: [phone]     Social Determinants of Health (SDOH) Interventions    Readmission Risk Interventions No flowsheet data found.

## 2020-01-05 NOTE — Evaluation (Signed)
Physical Therapy Evaluation Patient Details Name: [name] MRN: [id number] DOB: 05-04-1941 Today's Date: 01/05/2020   History of Present Illness  Pt is an 79 y/o female admitted secondary to increased SOB likely from CHF exacerbation. PMH includes CHF, HTN, and pacemaker insertion.   Clinical Impression  Pt admitted secondary to problem above with deficits below. Pt requiring min guard A for mobility tasks. Pt holding to son's arm and using cane as well for steadying. Educated about using RW at home to increase safety. Will continue to follow acutely to maximize functional mobility independence and safety.     Follow Up Recommendations Home health PT    Equipment Recommendations  Rolling walker with 5" wheels;3in1 (PT)    Recommendations for Other Services       Precautions / Restrictions Precautions Precautions: Fall Restrictions Weight Bearing Restrictions: No      Mobility  Bed Mobility Overal bed mobility: Needs Assistance Bed Mobility: Supine to Sit     Supine to sit: Supervision     General bed mobility comments: Supervision for safety. Increased time required.   Transfers Overall transfer level: Needs assistance Equipment used: Straight cane Transfers: Sit to/from Stand Sit to Stand: Min guard         General transfer comment: Min guard for safety.   Ambulation/Gait Ambulation/Gait assistance: Min guard Gait Distance (Feet): 30 Feet Assistive device: 1 person hand held assist;Straight cane Gait Pattern/deviations: Step-through pattern;Decreased stride length Gait velocity: decreased   General Gait Details: Cautious gait within the room. Pt holding to cane with one hand and holding onto her son with the other hand for comfort. No LOB noted. Educated about using RW  at home to increase safety.   Stairs            Wheelchair Mobility    Modified Rankin (Stroke Patients Only)       Balance Overall balance assessment: Needs  assistance Sitting-balance support: No upper extremity supported;Feet supported Sitting balance-Leahy Scale: Good     Standing balance support: Bilateral upper extremity supported;Single extremity supported;During functional activity Standing balance-Leahy Scale: Fair                               Pertinent Vitals/Pain Pain Assessment: No/denies pain    Home Living Family/patient expects to be discharged to:: Private residence Living Arrangements: Other relatives(granddaughter) Available Help at Discharge: Family Type of Home: House Home Access: Stairs to enter Entrance Stairs-Rails: None Entrance Stairs-Number of Steps: 1 Home Layout: Multi-level;Able to live on main level with bedroom/bathroom Home Equipment: Kasandra Knudsen - single point      Prior Function Level of Independence: Independent with assistive device(s)         Comments: Used cane for ambulation.      Hand Dominance        Extremity/Trunk Assessment   Upper Extremity Assessment Upper Extremity Assessment: Overall WFL for tasks assessed    Lower Extremity Assessment Lower Extremity Assessment: Generalized weakness    Cervical / Trunk Assessment Cervical / Trunk Assessment: Normal  Communication   Communication: No difficulties  Cognition Arousal/Alertness: Awake/alert Behavior During Therapy: WFL for tasks assessed/performed Overall Cognitive Status: Within Functional Limits for tasks assessed                                        General Comments General  comments (skin integrity, edema, etc.): Pt's son present during session.     Exercises     Assessment/Plan    PT Assessment Patient needs continued PT services  PT Problem List Decreased strength;Decreased balance;Decreased mobility;Decreased activity tolerance;Decreased knowledge of use of DME       PT Treatment Interventions Gait training;DME instruction;Functional mobility training;Therapeutic  activities;Therapeutic exercise;Stair training;Balance training;Patient/family education    PT Goals (Current goals can be found in the Care Plan section)  Acute Rehab PT Goals Patient Stated Goal: to go home PT Goal Formulation: With patient Time For Goal Achievement: 01/19/20 Potential to Achieve Goals: Good    Frequency Min 3X/week   Barriers to discharge        Co-evaluation               AM-PAC PT "6 Clicks" Mobility  Outcome Measure Help needed turning from your back to your side while in a flat bed without using bedrails?: None Help needed moving from lying on your back to sitting on the side of a flat bed without using bedrails?: None Help needed moving to and from a bed to a chair (including a wheelchair)?: A Little Help needed standing up from a chair using your arms (e.g., wheelchair or bedside chair)?: A Little Help needed to walk in hospital room?: A Little Help needed climbing 3-5 steps with a railing? : A Lot 6 Click Score: 19    End of Session Equipment Utilized During Treatment: Gait belt Activity Tolerance: Patient tolerated treatment well Patient left: in bed;with call bell/phone within reach;with family/visitor present(sitting EOB ) Nurse Communication: Mobility status PT Visit Diagnosis: Other abnormalities of gait and mobility (R26.89);Muscle weakness (generalized) (M62.81)    Time: [id number] PT Time Calculation (min) (ACUTE ONLY): 15 min   Charges:   PT Evaluation $PT Eval Low Complexity: 1 Low          [name], DPT  Acute Rehabilitation Services  Pager: [phone] Office: [phone]   [name] 01/05/2020, 12:05 PM

## 2020-01-06 ENCOUNTER — Ambulatory Visit (INDEPENDENT_AMBULATORY_CARE_PROVIDER_SITE_OTHER): Payer: Medicare HMO | Admitting: *Deleted

## 2020-01-06 DIAGNOSIS — I442 Atrioventricular block, complete: Secondary | ICD-10-CM

## 2020-01-06 LAB — CUP PACEART REMOTE DEVICE CHECK
Battery Remaining Longevity: 1 mo
Battery Remaining Percentage: 0.5 %
Battery Voltage: 2.62 V
Brady Statistic AP VP Percent: 31 %
Brady Statistic AP VS Percent: 1 %
Brady Statistic AS VP Percent: 69 %
Brady Statistic AS VS Percent: 1 %
Brady Statistic RA Percent Paced: 30 %
Brady Statistic RV Percent Paced: 99 %
Date Time Interrogation Session: 20210601021703
Implantable Lead Implant Date: 20120702
Implantable Lead Implant Date: 20120702
Implantable Lead Location: 753859
Implantable Lead Location: 753860
Implantable Pulse Generator Implant Date: 20120702
Lead Channel Impedance Value: 360 Ohm
Lead Channel Impedance Value: 400 Ohm
Lead Channel Pacing Threshold Amplitude: 0.75 V
Lead Channel Pacing Threshold Amplitude: 1 V
Lead Channel Pacing Threshold Pulse Width: 0.4 ms
Lead Channel Pacing Threshold Pulse Width: 0.5 ms
Lead Channel Sensing Intrinsic Amplitude: 12 mV
Lead Channel Sensing Intrinsic Amplitude: 3.5 mV
Lead Channel Setting Pacing Amplitude: 1.25 V
Lead Channel Setting Pacing Amplitude: 2 V
Lead Channel Setting Pacing Pulse Width: 0.5 ms
Lead Channel Setting Sensing Sensitivity: 4 mV
Pulse Gen Model: 2210
Pulse Gen Serial Number: 7254877

## 2020-01-06 NOTE — Progress Notes (Signed)
Remote pacemaker transmission.   

## 2020-01-08 ENCOUNTER — Other Ambulatory Visit: Payer: Self-pay

## 2020-01-08 NOTE — Patient Outreach (Signed)
[place] [facility]) Care Management  01/08/2020  Columba Mccoll 09/17/1940 [id number]   Referral Date: 01/08/20 Referral Source: [employer] Report Date of Discharge: 01/05/20 Facility:  [place]:  [facility]  Referral received.  No outreach warranted at this time.  Transition of Care calls being completed via EMMI. RN CM will outreach patient for any red flags received.    Plan: RN CM will close case.    Jone Baseman, RN, MSN [facility] Care Management Care Management Coordinator Direct Line [phone] Toll Free: [phone]  Fax: [phone]

## 2020-01-15 ENCOUNTER — Other Ambulatory Visit: Payer: Self-pay

## 2020-01-15 ENCOUNTER — Ambulatory Visit (INDEPENDENT_AMBULATORY_CARE_PROVIDER_SITE_OTHER): Payer: Medicare HMO | Admitting: Pharmacist

## 2020-01-15 ENCOUNTER — Telehealth: Payer: Self-pay | Admitting: Pharmacist

## 2020-01-15 DIAGNOSIS — Z950 Presence of cardiac pacemaker: Secondary | ICD-10-CM

## 2020-01-15 DIAGNOSIS — Z5181 Encounter for therapeutic drug level monitoring: Secondary | ICD-10-CM

## 2020-01-15 DIAGNOSIS — I471 Supraventricular tachycardia: Secondary | ICD-10-CM

## 2020-01-15 LAB — POCT INR: INR: 3.2 — AB (ref 2.0–3.0)

## 2020-01-15 NOTE — Patient Instructions (Signed)
Description   Increase greens this week and continue taking 2 tablets daily except 1.5 tablets on Mondays, Wednesdays, and Fridays. Repeat INR in 2 weeks in the office. [facility] [phone] Main Number [phone]

## 2020-01-15 NOTE — Telephone Encounter (Signed)
Patient came in for anticoagulation visit today.  Reported she had been asking for a bed and toilet to be delivered to her house.

## 2020-01-16 NOTE — Telephone Encounter (Signed)
Call placed to Pt.  Her PCP is working on getting her a bed and toilet.  She asked this nurse to call back in a few weeks to check on her.  Reminder placed

## 2020-01-29 ENCOUNTER — Other Ambulatory Visit: Payer: Self-pay

## 2020-01-29 ENCOUNTER — Emergency Department (HOSPITAL_COMMUNITY): Payer: Medicare HMO

## 2020-01-29 ENCOUNTER — Observation Stay (HOSPITAL_COMMUNITY)
Admission: EM | Admit: 2020-01-29 | Discharge: 2020-01-30 | Disposition: A | Discharge status: Home / Self Care | Payer: Medicare HMO | Attending: Cardiovascular Disease | Admitting: Cardiovascular Disease

## 2020-01-29 DIAGNOSIS — Z79899 Other long term (current) drug therapy: Secondary | ICD-10-CM | POA: Insufficient documentation

## 2020-01-29 DIAGNOSIS — Z20822 Contact with and (suspected) exposure to covid-19: Secondary | ICD-10-CM | POA: Diagnosis not present

## 2020-01-29 DIAGNOSIS — E785 Hyperlipidemia, unspecified: Secondary | ICD-10-CM | POA: Diagnosis not present

## 2020-01-29 DIAGNOSIS — Z7951 Long term (current) use of inhaled steroids: Secondary | ICD-10-CM | POA: Insufficient documentation

## 2020-01-29 DIAGNOSIS — R0602 Shortness of breath: Principal | ICD-10-CM | POA: Insufficient documentation

## 2020-01-29 DIAGNOSIS — I509 Heart failure, unspecified: Secondary | ICD-10-CM | POA: Diagnosis not present

## 2020-01-29 DIAGNOSIS — I959 Hypotension, unspecified: Secondary | ICD-10-CM | POA: Diagnosis not present

## 2020-01-29 DIAGNOSIS — J45909 Unspecified asthma, uncomplicated: Secondary | ICD-10-CM | POA: Diagnosis not present

## 2020-01-29 DIAGNOSIS — E860 Dehydration: Secondary | ICD-10-CM | POA: Diagnosis present

## 2020-01-29 DIAGNOSIS — M1990 Unspecified osteoarthritis, unspecified site: Secondary | ICD-10-CM | POA: Insufficient documentation

## 2020-01-29 DIAGNOSIS — I447 Left bundle-branch block, unspecified: Secondary | ICD-10-CM | POA: Diagnosis not present

## 2020-01-29 DIAGNOSIS — R791 Abnormal coagulation profile: Secondary | ICD-10-CM | POA: Diagnosis not present

## 2020-01-29 DIAGNOSIS — Z6841 Body Mass Index (BMI) 40.0 and over, adult: Secondary | ICD-10-CM | POA: Diagnosis not present

## 2020-01-29 DIAGNOSIS — I480 Paroxysmal atrial fibrillation: Secondary | ICD-10-CM | POA: Diagnosis not present

## 2020-01-29 DIAGNOSIS — K219 Gastro-esophageal reflux disease without esophagitis: Secondary | ICD-10-CM | POA: Diagnosis not present

## 2020-01-29 DIAGNOSIS — I130 Hypertensive heart and chronic kidney disease with heart failure and stage 1 through stage 4 chronic kidney disease, or unspecified chronic kidney disease: Secondary | ICD-10-CM | POA: Diagnosis not present

## 2020-01-29 DIAGNOSIS — D638 Anemia in other chronic diseases classified elsewhere: Secondary | ICD-10-CM | POA: Diagnosis not present

## 2020-01-29 DIAGNOSIS — N182 Chronic kidney disease, stage 2 (mild): Secondary | ICD-10-CM | POA: Insufficient documentation

## 2020-01-29 DIAGNOSIS — I5032 Chronic diastolic (congestive) heart failure: Secondary | ICD-10-CM | POA: Diagnosis not present

## 2020-01-29 DIAGNOSIS — Z7901 Long term (current) use of anticoagulants: Secondary | ICD-10-CM | POA: Insufficient documentation

## 2020-01-29 DIAGNOSIS — R000 Tachycardia, unspecified: Secondary | ICD-10-CM | POA: Diagnosis not present

## 2020-01-29 DIAGNOSIS — Z950 Presence of cardiac pacemaker: Secondary | ICD-10-CM | POA: Diagnosis not present

## 2020-01-29 DIAGNOSIS — J4520 Mild intermittent asthma, uncomplicated: Secondary | ICD-10-CM | POA: Diagnosis not present

## 2020-01-29 DIAGNOSIS — I491 Atrial premature depolarization: Secondary | ICD-10-CM | POA: Diagnosis not present

## 2020-01-29 LAB — COMPREHENSIVE METABOLIC PANEL
ALT: 13 U/L (ref 0–44)
AST: 23 U/L (ref 15–41)
Albumin: 3.1 g/dL — ABNORMAL LOW (ref 3.5–5.0)
Alkaline Phosphatase: 103 U/L (ref 38–126)
Anion gap: 10 (ref 5–15)
BUN: 35 mg/dL — ABNORMAL HIGH (ref 8–23)
CO2: 19 mmol/L — ABNORMAL LOW (ref 22–32)
Calcium: 8.5 mg/dL — ABNORMAL LOW (ref 8.9–10.3)
Chloride: 110 mmol/L (ref 98–111)
Creatinine, Ser: 1.56 mg/dL — ABNORMAL HIGH (ref 0.44–1.00)
GFR calc Af Amer: 36 mL/min — ABNORMAL LOW (ref >60)
GFR calc non Af Amer: 31 mL/min — ABNORMAL LOW (ref >60)
Glucose, Bld: 91 mg/dL (ref 70–99)
Potassium: 4.3 mmol/L (ref 3.5–5.1)
Sodium: 139 mmol/L (ref 135–145)
Total Bilirubin: 0.8 mg/dL (ref 0.3–1.2)
Total Protein: 6.2 g/dL — ABNORMAL LOW (ref 6.5–8.1)

## 2020-01-29 LAB — URINALYSIS, ROUTINE W REFLEX MICROSCOPIC
Bilirubin Urine: NEGATIVE
Glucose, UA: NEGATIVE mg/dL
Hgb urine dipstick: NEGATIVE
Ketones, ur: NEGATIVE mg/dL
Leukocytes,Ua: NEGATIVE
Nitrite: NEGATIVE
Protein, ur: NEGATIVE mg/dL
Specific Gravity, Urine: 1.019 (ref 1.005–1.030)
pH: 5 (ref 5.0–8.0)

## 2020-01-29 LAB — CBC WITH DIFFERENTIAL/PLATELET
Abs Immature Granulocytes: 0.02 10*3/uL (ref 0.00–0.07)
Basophils Absolute: 0.1 10*3/uL (ref 0.0–0.1)
Basophils Relative: 1 %
Eosinophils Absolute: 0.1 10*3/uL (ref 0.0–0.5)
Eosinophils Relative: 1 %
HCT: 34.7 % — ABNORMAL LOW (ref 36.0–46.0)
Hemoglobin: 10.6 g/dL — ABNORMAL LOW (ref 12.0–15.0)
Immature Granulocytes: 0 %
Lymphocytes Relative: 23 %
Lymphs Abs: 1.8 10*3/uL (ref 0.7–4.0)
MCH: 28 pg (ref 26.0–34.0)
MCHC: 30.5 g/dL (ref 30.0–36.0)
MCV: 91.8 fL (ref 80.0–100.0)
Monocytes Absolute: 0.7 10*3/uL (ref 0.1–1.0)
Monocytes Relative: 9 %
Neutro Abs: 5.2 10*3/uL (ref 1.7–7.7)
Neutrophils Relative %: 66 %
Platelets: 209 10*3/uL (ref 150–400)
RBC: 3.78 MIL/uL — ABNORMAL LOW (ref 3.87–5.11)
RDW: 17.2 % — ABNORMAL HIGH (ref 11.5–15.5)
WBC: 7.9 10*3/uL (ref 4.0–10.5)
nRBC: 0 % (ref 0.0–0.2)

## 2020-01-29 LAB — SARS CORONAVIRUS 2 BY RT PCR (HOSPITAL ORDER, PERFORMED IN ~~LOC~~ HOSPITAL LAB): SARS Coronavirus 2: NEGATIVE

## 2020-01-29 LAB — BRAIN NATRIURETIC PEPTIDE: B Natriuretic Peptide: 29.5 pg/mL (ref 0.0–100.0)

## 2020-01-29 MED ORDER — SODIUM CHLORIDE 0.9 % IV SOLN: INTRAVENOUS | Status: DC

## 2020-01-29 MED ORDER — LEVALBUTEROL HCL 1.25 MG/0.5ML IN NEBU: 1.2500 mg | INHALATION_SOLUTION | RESPIRATORY_TRACT | Status: DC | PRN

## 2020-01-29 MED ORDER — ATORVASTATIN CALCIUM 10 MG PO TABS
20.0000 mg | ORAL_TABLET | Freq: Every day | ORAL | Status: DC
  Administered 2020-01-29 – 2020-01-30 (×2): 20 mg via ORAL
  Filled 2020-01-29 (×2): qty 2

## 2020-01-29 MED ORDER — MONTELUKAST SODIUM 10 MG PO TABS
10.0000 mg | ORAL_TABLET | Freq: Every day | ORAL | Status: DC
  Administered 2020-01-29: 10 mg via ORAL
  Filled 2020-01-29: qty 1

## 2020-01-29 MED ORDER — PANTOPRAZOLE SODIUM 40 MG PO TBEC
40.0000 mg | DELAYED_RELEASE_TABLET | Freq: Every day | ORAL | Status: DC
  Administered 2020-01-29 – 2020-01-30 (×2): 40 mg via ORAL
  Filled 2020-01-29 (×2): qty 1

## 2020-01-29 MED ORDER — MOMETASONE FURO-FORMOTEROL FUM 100-5 MCG/ACT IN AERO
2.0000 | INHALATION_SPRAY | Freq: Two times a day (BID) | RESPIRATORY_TRACT | Status: DC
  Administered 2020-01-29 – 2020-01-30 (×2): 2 via RESPIRATORY_TRACT
  Filled 2020-01-29: qty 8.8

## 2020-01-29 MED ORDER — ACETAMINOPHEN 325 MG PO TABS: 650.0000 mg | ORAL_TABLET | ORAL | Status: DC | PRN

## 2020-01-29 MED ORDER — SPIRONOLACTONE 25 MG PO TABS
25.0000 mg | ORAL_TABLET | Freq: Every day | ORAL | Status: DC
  Administered 2020-01-29 – 2020-01-30 (×2): 25 mg via ORAL
  Filled 2020-01-29 (×3): qty 1

## 2020-01-29 MED ORDER — PREDNISONE 20 MG PO TABS
60.0000 mg | ORAL_TABLET | ORAL | Status: AC
  Administered 2020-01-29: 60 mg via ORAL
  Filled 2020-01-29: qty 3

## 2020-01-29 MED ORDER — POTASSIUM CHLORIDE CRYS ER 20 MEQ PO TBCR
20.0000 meq | EXTENDED_RELEASE_TABLET | Freq: Every day | ORAL | Status: DC
  Administered 2020-01-29: 20 meq via ORAL
  Filled 2020-01-29: qty 1

## 2020-01-29 NOTE — H&P (Signed)
Referring Physician: [name] is an 79 y.o. female.                       Chief Complaint: Shortness of breath  HPI: 79 years old black female with PMH of emphysema, diastolic left hear failure, Hypertension, Hyperlipidemia, S/P pacemaker placement, CKD, II, asthma, h/o remote kidney disease and morbid obesity has increasing shortness of breath. Chest x-ray and BNP levels are unremarkable. EKG shows sinus rhythm with LBBB. PT/INR is mildly elevated. Creatinine is mildly elevated post diuretic use. Her leg edema has improved. She has increased anxiety from losing few family members due to COVID infection and other causes. She denies chest pain.   Past Medical History:  Diagnosis Date  . Allergic rhinitis   . Anemia    hx due to med  . Arthritis   . Asthma   . CHF (congestive heart failure) ([place])   . Dyspnea   . Emphysema of lung ([place])   . GERD (gastroesophageal reflux disease)   . Hyperlipidemia   . Hypertension   . Pacemaker   . PAT (paroxysmal atrial tachycardia) ([place]) 2008  . Pneumonia    hx  . 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 ABDOMINAL HYSTERECTOMY    . BREAST BIOPSY  04/30/2012   Procedure: BREAST BIOPSY WITH NEEDLE LOCALIZATION;  Surgeon: Rolm Bookbinder, MD;  Location: [place];  Service: General;  Laterality: Left;  Left breast wire localization biospy  . HEMORRHOID SURGERY    . PACEMAKER INSERTION  02-06-2011  . TAH and BSO    . TONSILLECTOMY      Family History  Problem Relation Age of Onset  . Heart disease Mother   . Cancer Sister        breast   Social History:  reports that she quit smoking about 42 years ago. Her smoking use included cigarettes. She has a 4.00 pack-year smoking history. She has never used smokeless tobacco. She reports that she does not drink alcohol and does not use drugs.  Allergies:  Allergies  Allergen Reactions  . Albuterol Palpitations  and Other (See Comments)    irregular heartbeat    (Not in a hospital admission)   Results for orders placed or performed during the hospital encounter of 01/29/20 (from the past 48 hour(s))  Comprehensive metabolic panel     Status: Abnormal   Collection Time: 01/29/20  1:44 PM  Result Value Ref Range   Sodium 139 135 - 145 mmol/L   Potassium 4.3 3.5 - 5.1 mmol/L   Chloride 110 98 - 111 mmol/L   CO2 19 (L) 22 - 32 mmol/L   Glucose, Bld 91 70 - 99 mg/dL    Comment: Glucose reference range applies only to samples taken after fasting for at least 8 hours.   BUN 35 (H) 8 - 23 mg/dL   Creatinine, Ser 1.56 (H) 0.44 - 1.00 mg/dL   Calcium 8.5 (L) 8.9 - 10.3 mg/dL   Total Protein 6.2 (L) 6.5 - 8.1 g/dL   Albumin 3.1 (L) 3.5 - 5.0 g/dL   AST 23 15 - 41 U/L   ALT 13 0 - 44 U/L   Alkaline Phosphatase 103 38 - 126 U/L   Total Bilirubin 0.8 0.3 - 1.2 mg/dL   GFR calc non Af Amer 31 (L) >60 mL/min  GFR calc Af Amer 36 (L) >60 mL/min   Anion gap 10 5 - 15    Comment: Performed at [facility] [address] [place], [place] [address]  CBC WITH DIFFERENTIAL     Status: Abnormal   Collection Time: 01/29/20  1:44 PM  Result Value Ref Range   WBC 7.9 4.0 - 10.5 K/uL   RBC 3.78 (L) 3.87 - 5.11 MIL/uL   Hemoglobin 10.6 (L) 12.0 - 15.0 g/dL   HCT 34.7 (L) 36 - 46 %   MCV 91.8 80.0 - 100.0 fL   MCH 28.0 26.0 - 34.0 pg   MCHC 30.5 30.0 - 36.0 g/dL   RDW 17.2 (H) 11.5 - 15.5 %   Platelets 209 150 - 400 K/uL   nRBC 0.0 0.0 - 0.2 %   Neutrophils Relative % 66 %   Neutro Abs 5.2 1.7 - 7.7 K/uL   Lymphocytes Relative 23 %   Lymphs Abs 1.8 0.7 - 4.0 K/uL   Monocytes Relative 9 %   Monocytes Absolute 0.7 0 - 1 K/uL   Eosinophils Relative 1 %   Eosinophils Absolute 0.1 0 - 0 K/uL   Basophils Relative 1 %   Basophils Absolute 0.1 0 - 0 K/uL   Immature Granulocytes 0 %   Abs Immature Granulocytes 0.02 0.00 - 0.07 K/uL    Comment: Performed at [facility], [place] [address]  [place], [place] [address]  Brain natriuretic peptide     Status: None   Collection Time: 01/29/20  1:44 PM  Result Value Ref Range   B Natriuretic Peptide 29.5 0.0 - 100.0 pg/mL    Comment: Performed at [facility] [address], [place], [place] [address]  Protime-INR     Status: Abnormal   Collection Time: 01/29/20  1:44 PM  Result Value Ref Range   Prothrombin Time 42.3 (H) 11.4 - 15.2 seconds   INR 4.6 (HH) 0.8 - 1.2    Comment: CRITICAL RESULT CALLED TO, READ BACK BY AND VERIFIED WITH: Dois Davenport RN [phone] 01/29/20 K. SANDERS (NOTE) INR goal varies based on device and disease states. Performed at [facility], [place] [address] [place], [place] [address]    DG Chest Port 1 View  Result Date: 01/29/2020 CLINICAL DATA:  Severe shortness of breath for 2 days EXAM: PORTABLE CHEST 1 VIEW COMPARISON:  01/02/2020 FINDINGS: There is no focal consolidation. There is no pleural effusion or pneumothorax. The heart and mediastinal contours are unremarkable. There is a dual lead cardiac pacemaker. There is no acute osseous abnormality. IMPRESSION: No active disease. Electronically Signed   By: Kathreen Devoid   On: 01/29/2020 13:45    Review Of Systems Constitutional: No fever, chills, Positive Chronic weight gain. Eyes: No vision change, wears glasses. No discharge or pain. Ears: No hearing loss, No tinnitus. Respiratory: Positive asthma, COPD, positive pneumonias, shortness of breath. No hemoptysis. Cardiovascular: Positive chest pain, palpitation, leg edema. Gastrointestinal: No nausea, vomiting, diarrhea, constipation. No GI bleed. No hepatitis. Genitourinary: No dysuria, hematuria, kidney stone. No incontinance. Neurological: Positive headache, no stroke, no seizures.  Psychiatry: No psych facility admission for anxiety, depression, suicide. No detox. Skin: No rash. Musculoskeletal: Positive joint pain, fibromyalgia, neck pain, back pain. Lymphadenopathy: No  lymphadenopathy. Hematology: No anemia or easy bruising.   Blood pressure 109/64, pulse (!) 59, temperature 97.7 F (36.5 C), temperature source Oral, resp. rate 13, height 5' 7.5" (1.715 m), weight 108.9 kg, SpO2 100 %.  Body mass index is 37.03 kg/m. General appearance: alert, cooperative, appears stated age and no distress Head: Normocephalic, atraumatic. Eyes: Brown eyes, wears glasses, pink conjunctiva, corneas clear. PERRL, EOM's intact. Neck: No adenopathy, no carotid bruit, no JVD, supple, symmetrical, trachea midline and thyroid not enlarged. Resp: Clear to auscultation bilaterally. Cardio: Regular rate and rhythm, S1, S2 normal, II/VI systolic murmur, no click, rub or gallop GI: Soft, non-tender; bowel sounds normal; no organomegaly. Extremities: Trace edema, cyanosis or clubbing. Skin: Warm and dry.  Neurologic: Alert and oriented X 3, normal strength. Normal coordination and slow gait.  Assessment/Plan Shortness of breath Chronic left heart diastolic failure Hypertension Hyperlipidemia Morbid obesity Asthma Paroxysmal atrial fibrillation, CHA2DS2VASc score of 5 Chronic warfarin use with elevated INR S/P permanent pacemaker  Place in observation. Hold warfarin, then decrease dose. Hold valsartan and furosemide, then add smaller dose. Low dose IV fluids for renal perfusion.  Time spent: Review of old records, Lab, x-rays, EKG, other cardiac tests, examination, discussion with patient over 70 minutes.  Birdie Riddle, MD  01/29/2020, 5:03 PM

## 2020-01-29 NOTE — ED Notes (Signed)
rn attempted report x1

## 2020-01-29 NOTE — ED Provider Notes (Signed)
[facility] EMERGENCY DEPARTMENT Provider Note   CSN: [id number] Arrival date & time: 01/29/20  1305     History Chief Complaint  Patient presents with   Shortness of Breath    [name] is a 79 y.o. female.  HPI    Patient presents with dyspnea. Patient has history of multiple medical issues including emphysema, CHF.  She was hospitalized last month.  She notes that on discharge she did have adjustment in her diuretics.  She is doing generally well until about 1 week ago, when she had mild shortness of breath. Now, over the past 2 or 3 days that has worsened.  No appreciable new lower extremity edema, no reported fever, no cough.  She is taking her medication as directed, and reports no other new medications. With worsening dyspnea she called EMS. History is done by the patient and EMS providers They note that the patient was awake and alert, with no hypoxia on their arrival.  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 yrs meds, no surgery in kidneys    Patient Active Problem List   Diagnosis Date Noted   Shortness of breath 01/02/2020   Congestive heart failure with left ventricular diastolic dysfunction, acute ([place]) 06/03/2019   Acute on chronic diastolic CHF (congestive heart failure) ([place]) 06/03/2019   GERD (gastroesophageal reflux disease) 06/11/2018   Encounter for therapeutic drug monitoring 05/11/2015   Tachy-brady syndrome ([place]) 05/11/2015   Severe obesity (BMI >= 40) ([place]) 05/03/2015   Essential hypertension 02/03/2015   Dyspnea 02/02/2015   Chronic diastolic heart failure ([place])  01/04/2015   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   Complete heart block ([place]) 05/09/2011   Obesity, Class III, BMI 40-49.9 (morbid obesity) ([place]) 05/09/2011   TB, KIDNEY, UNSPECIFIED 06/01/2007   PAROXYSMAL ATRIAL TACHYCARDIA 06/01/2007   Allergic rhinitis 06/01/2007   Mild persistent asthma 06/01/2007    Past Surgical History:  Procedure Laterality Date   ABDOMINAL HYSTERECTOMY     BREAST BIOPSY  04/30/2012   Procedure: BREAST BIOPSY WITH NEEDLE LOCALIZATION;  Surgeon: Rolm Bookbinder, MD;  Location: [place];  Service: General;  Laterality: Left;  Left breast wire localization biospy   HEMORRHOID SURGERY     PACEMAKER INSERTION  02-06-2011   TAH and BSO     TONSILLECTOMY       OB History   No obstetric history on file.     Family History  Problem Relation Age of Onset   Heart disease Mother    Cancer Sister        breast    Social History   Tobacco Use   Smoking status: Former Smoker    Packs/day: 1.00    Years: 4.00    Pack years: 4.00    Types: Cigarettes    Quit date: 08/07/1977    Years since quitting: 42.5   Smokeless tobacco: Never Used  Vaping Use   Vaping Use: Never used  Substance Use Topics   Alcohol use: No    Alcohol/week:  0.0 standard drinks    Comment: quit 1979   Drug use: No    Home Medications Prior to Admission medications   Medication Sig Start Date End Date Taking? Authorizing Provider  acetaminophen (TYLENOL) 325 MG tablet Take 2 tablets (650 mg total) by mouth every 4 (four) hours as needed for headache or mild pain. 06/05/19   Dixie Dials, MD  atorvastatin (LIPITOR) 20 MG tablet Take 20 mg by mouth daily. 04/15/19   [provider]  budesonide-formoterol (SYMBICORT) 80-4.5 MCG/ACT inhaler Inhale 2 puffs into the lungs 2 (two) times daily. 05/24/18   Lauraine Rinne, NP  furosemide (LASIX) 40 MG tablet Take 1 tablet (40 mg total) by mouth daily. 01/05/20   Dixie Dials, MD  levalbuterol [facility] HFA) 45 MCG/ACT inhaler  Inhale 2 puffs into the lungs every 4 (four) hours as needed for wheezing. 05/31/18   Lauraine Rinne, NP  montelukast (SINGULAIR) 10 MG tablet TAKE 1 TABLET BY MOUTH AT BEDTIME EVERY NIGHT Patient taking differently: Take 10 mg by mouth at bedtime.  05/21/18   Lauraine Rinne, NP  omeprazole (PRILOSEC) 20 MG capsule Take 1 capsule (20 mg total) by mouth daily. Take 30-60 min before first meal of the day 05/14/18   Lauraine Rinne, NP  potassium chloride (KLOR-CON) 20 MEQ tablet Take 1 tablet (20 mEq total) by mouth daily. 01/05/20   Dixie Dials, MD  spironolactone (ALDACTONE) 25 MG tablet TAKE ONE TABLET BY MOUTH ONCE DAILY Patient taking differently: Take 25 mg by mouth daily.  05/05/16   Tanda Rockers, MD  valsartan (DIOVAN) 160 MG tablet Take 160 mg by mouth daily.    [provider]  warfarin (COUMADIN) 5 MG tablet Take 1 tablet (5 mg total) by mouth daily at 4 PM. 01/05/20   Dixie Dials, MD    Allergies    Albuterol  Review of Systems   Review of Systems  Constitutional:       Per HPI, otherwise negative  HENT:       Per HPI, otherwise negative  Respiratory:       Per HPI, otherwise negative  Cardiovascular:       Per HPI, otherwise negative  Gastrointestinal: Negative for vomiting.  Endocrine:       Negative aside from HPI  Genitourinary:       Neg aside from HPI   Musculoskeletal:       Per HPI, otherwise negative  Skin: Negative.   Neurological: Negative for syncope.    Physical Exam Updated Vital Signs BP (!) 122/108 (BP Location: Right Arm)    Pulse 79    Temp 97.7 F (36.5 C) (Oral)    Resp 20    Ht 5' 7.5" (1.715 m)    Wt 108.9 kg    SpO2 99%    BMI 37.03 kg/m   Physical Exam Vitals and nursing note reviewed.  Constitutional:      General: She is not in acute distress.    Appearance: She is well-developed.  HENT:     Head: Normocephalic and atraumatic.  Eyes:     Conjunctiva/sclera: Conjunctivae normal.  Cardiovascular:     Rate and Rhythm: Normal  rate and regular rhythm.  Pulmonary:     Effort: Pulmonary effort is normal. No respiratory distress.     Breath sounds: Normal breath sounds. No stridor.  Abdominal:     General: There is no distension.  Skin:    General: Skin is warm and dry.  Neurological:     Mental Status: She is alert and oriented to person, place, and time.     Cranial Nerves: No cranial nerve deficit.     ED Results / Procedures / Treatments   Labs (all labs ordered are listed, but only abnormal results are displayed) Labs Reviewed  CBC WITH DIFFERENTIAL/PLATELET - Abnormal; Notable for the following components:      Result Value   RBC 3.78 (*)    Hemoglobin 10.6 (*)    HCT 34.7 (*)    RDW 17.2 (*)    All other components within normal limits  COMPREHENSIVE METABOLIC PANEL  BRAIN NATRIURETIC PEPTIDE  URINALYSIS, ROUTINE W REFLEX MICROSCOPIC  PROTIME-INR    EKG None  Radiology DG Chest Port 1 View  Result Date: 01/29/2020 CLINICAL DATA:  Severe shortness of breath for 2 days EXAM: PORTABLE CHEST 1 VIEW COMPARISON:  01/02/2020 FINDINGS: There is no focal consolidation. There is no pleural effusion or pneumothorax. The heart and mediastinal contours are unremarkable. There is a dual lead cardiac pacemaker. There is no acute osseous abnormality. IMPRESSION: No active disease. Electronically Signed   By: Kathreen Devoid   On: 01/29/2020 13:45    Procedures Procedures (including critical care time)  Medications Ordered in ED Medications - No data to display  ED Course  I have reviewed the triage vital signs and the nursing notes.  Pertinent labs & imaging results that were available during my care of the patient were reviewed by me and considered in my medical decision making (see chart for details).   Consideration of COPD exacerbation versus CHF exacerbation versus infection, less likely arrhythmia, ACS given initial denial of chest pain.  Patient had labs, EKG, x-ray ordered.  3:34 PM In  similar condition. Initial labs notable for evidence for acute kidney injury. Given the patient's recent hospitalization, increase in Lasix, and this may be secondary to medication regimen.  On her x-ray, BMP findings are otherwise reassuring. Given her close involvement with her cardiologist I discussed her case with him, and he will evaluate the patient as well. Patient may be appropriate for discharge pending evaluation, and remaining labs resulting.  Final Clinical Impression(s) / ED Diagnoses Final diagnoses:  SOB (shortness of breath)     Carmin Muskrat, MD 01/29/20 1535

## 2020-01-29 NOTE — Discharge Instructions (Addendum)
As discussed, your evaluation today has been largely reassuring.  But, it is important that you monitor your condition carefully, and do not hesitate to return to the ED if you develop new, or concerning changes in your condition. ? ?Otherwise, please follow-up with your physician for appropriate ongoing care. ? ?

## 2020-01-29 NOTE — ED Notes (Signed)
Got patient undress on the monitor did ekg shown to Dr Lockwood patient is resting with call bell in reach 

## 2020-01-29 NOTE — Progress Notes (Signed)
Patient arrived to 4E room 21 at this time. Telemetry applied and CCMD notified. CHG bath done. V/s complete. Patient sitting on side of bed ordering dinner at this time.

## 2020-01-29 NOTE — ED Triage Notes (Signed)
Patient arrives via ems from home due to having worsening SOB x1 week. Patient states that the sob worsened the past 2 days, even more sob with ambulation.   Patient has a history of CHF, pacemaker, asthma, and was recently admitted for chf ~1 month ago.

## 2020-01-29 NOTE — Progress Notes (Signed)
Attempted to call for report. Nurse was on the phone. Left number for call back.

## 2020-01-30 DIAGNOSIS — Z20822 Contact with and (suspected) exposure to covid-19: Secondary | ICD-10-CM | POA: Diagnosis not present

## 2020-01-30 DIAGNOSIS — J4520 Mild intermittent asthma, uncomplicated: Secondary | ICD-10-CM | POA: Diagnosis not present

## 2020-01-30 DIAGNOSIS — I5032 Chronic diastolic (congestive) heart failure: Secondary | ICD-10-CM | POA: Diagnosis not present

## 2020-01-30 DIAGNOSIS — E860 Dehydration: Secondary | ICD-10-CM | POA: Diagnosis not present

## 2020-01-30 DIAGNOSIS — N182 Chronic kidney disease, stage 2 (mild): Secondary | ICD-10-CM | POA: Diagnosis not present

## 2020-01-30 DIAGNOSIS — K219 Gastro-esophageal reflux disease without esophagitis: Secondary | ICD-10-CM | POA: Diagnosis not present

## 2020-01-30 DIAGNOSIS — D638 Anemia in other chronic diseases classified elsewhere: Secondary | ICD-10-CM | POA: Diagnosis not present

## 2020-01-30 DIAGNOSIS — R0602 Shortness of breath: Secondary | ICD-10-CM | POA: Diagnosis not present

## 2020-01-30 DIAGNOSIS — E785 Hyperlipidemia, unspecified: Secondary | ICD-10-CM | POA: Diagnosis not present

## 2020-01-30 DIAGNOSIS — I130 Hypertensive heart and chronic kidney disease with heart failure and stage 1 through stage 4 chronic kidney disease, or unspecified chronic kidney disease: Secondary | ICD-10-CM | POA: Diagnosis not present

## 2020-01-30 LAB — BASIC METABOLIC PANEL
Anion gap: 10 (ref 5–15)
BUN: 32 mg/dL — ABNORMAL HIGH (ref 8–23)
CO2: 18 mmol/L — ABNORMAL LOW (ref 22–32)
Calcium: 8.8 mg/dL — ABNORMAL LOW (ref 8.9–10.3)
Chloride: 109 mmol/L (ref 98–111)
Creatinine, Ser: 1.33 mg/dL — ABNORMAL HIGH (ref 0.44–1.00)
GFR calc Af Amer: 43 mL/min — ABNORMAL LOW (ref >60)
GFR calc non Af Amer: 37 mL/min — ABNORMAL LOW (ref >60)
Glucose, Bld: 186 mg/dL — ABNORMAL HIGH (ref 70–99)
Potassium: 5.2 mmol/L — ABNORMAL HIGH (ref 3.5–5.1)
Sodium: 137 mmol/L (ref 135–145)

## 2020-01-30 LAB — CBC
HCT: 34.9 % — ABNORMAL LOW (ref 36.0–46.0)
Hemoglobin: 11 g/dL — ABNORMAL LOW (ref 12.0–15.0)
MCH: 27.7 pg (ref 26.0–34.0)
MCHC: 31.5 g/dL (ref 30.0–36.0)
MCV: 87.9 fL (ref 80.0–100.0)
Platelets: 210 10*3/uL (ref 150–400)
RBC: 3.97 MIL/uL (ref 3.87–5.11)
RDW: 16.8 % — ABNORMAL HIGH (ref 11.5–15.5)
WBC: 4.4 10*3/uL (ref 4.0–10.5)
nRBC: 0 % (ref 0.0–0.2)

## 2020-01-30 LAB — PROTIME-INR
INR: 4.6 (ref 0.8–1.2)
Prothrombin Time: 42.3 seconds — ABNORMAL HIGH (ref 11.4–15.2)

## 2020-01-30 MED ORDER — WARFARIN SODIUM 5 MG PO TABS: 2.5000 mg | ORAL_TABLET | Freq: Every day | ORAL | Status: DC

## 2020-01-30 MED ORDER — FUROSEMIDE 40 MG PO TABS: 40.0000 mg | ORAL_TABLET | ORAL | Status: DC

## 2020-01-30 MED ORDER — SPIRONOLACTONE 25 MG PO TABS: 12.5000 mg | ORAL_TABLET | Freq: Every day | ORAL | Status: DC

## 2020-01-30 NOTE — Progress Notes (Signed)
Pt discharged today to home with family.  Pt's IV removed. Pt taken off telemetry and CCMD notified.  Pt left with all of their personal belongings.  AVS documentation reviewed with Pt and all questions answered.   

## 2020-01-30 NOTE — Discharge Summary (Signed)
Physician Discharge Summary  Patient ID: [name] MRN: [id number] DOB/AGE: 12-09-1940 79 y.o.  Admit date: 01/29/2020 Discharge date: 01/30/2020  Admission Diagnoses: Shortness of breath Chronic left heart diastolic failure Hypertension Hyperlipidemia Morbid obesity Asthma Paroxysmal atrial fibrillation, CHA2DS2VASc score of 5 Chronic warfarin use with elevated INR S/P permanent pacemaker  Discharge Diagnoses:  Principle Problem: Dehydration Active Problems:   Shortness of breath, improving   Asthma   Chronic left heart diastolic failure   Hypertension   Hyperlipidemia   Morbid obesity   Paroxysmal atrial fibrillation, CHA2DS2VASc score of 5   CKD, II   Anemia of chronic disease   Chronic warfarin use with elevated INR, improving   S/P permanent pacemaker  Discharged Condition: fair  Hospital Course: 79 years old black female with PMH of emphysema, diastolic left heart failure, S/P pacemaker placement, CKD, II, asthma, 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 Her PT/INR was elevated to 4.6 and creatinine was elevated at 1.56. She had normal BNP and chest x-ray. EKG showed AV dual paced rhythm.  She was treated with IV solumedrol, IV fluids and holding lasix, warfarin and Valsartan. She improved with overnight stay and was discharged home with 50 % reduction in warfarin dose (to be resumed from Saturday evening), furosemide dose, spironolactone dose and discontinuation of potassium tablet. She will see me in 3 days and have her INR rechecked.  Consults: cardiology  Significant Diagnostic Studies: labs: Normal WBC and platelets count and Hgb was 10.6 to 11.0 gm. BMET was near normal except creatinine of 1.56 mg. and potassium of 4.3 to 5.2 meq. BNP was 29.5 pg. INR was 4.6 on admission and 4.3 on discharge  EKG: Dual AV paced rhythm with LBBB.   Chest x-ray : No active disease.  Treatments: cardiac  meds: Atorvastatin, furosemide, spironolactone and warfarin  Discharge Exam: Blood pressure 123/60, pulse 71, temperature 98.3 F (36.8 C), temperature source Oral, resp. rate 20, height 5\' 7"  (1.702 m), weight 108.5 kg, SpO2 98 %. General appearance: alert, cooperative and appears stated age. Head: Normocephalic, atraumatic. Eyes: Brown eyes, pink conjunctiva, corneas clear. PERRL, EOM's intact.  Neck: No adenopathy, no carotid bruit, no JVD, supple, symmetrical, trachea midline and thyroid not enlarged. Resp: Clear to auscultation bilaterally. Cardio: Regular rate and rhythm, S1, S2 normal, II/VI systolic murmur, no click, rub or gallop. GI: Soft, non-tender; bowel sounds normal; no organomegaly. Extremities: Trace edema,no  cyanosis or clubbing. Skin: Warm and dry.  Neurologic: Alert and oriented X 3, normal strength and tone. Normal coordination and slow gait.  Disposition: Discharge disposition: 01-Home or Self Care        Allergies as of 01/30/2020      Reactions   Albuterol Palpitations, Other (See Comments)   irregular heartbeat      Medication List    STOP taking these medications   potassium chloride SA 20 MEQ tablet Commonly known as: KLOR-CON   valsartan 160 MG tablet Commonly known as: DIOVAN     TAKE these medications   acetaminophen 325 MG tablet Commonly known as: TYLENOL Take 2 tablets (650 mg total) by mouth every 4 (four) hours as needed for headache or mild pain.   atorvastatin 20 MG tablet Commonly known as: LIPITOR Take 20 mg by mouth daily.   budesonide-formoterol 80-4.5 MCG/ACT inhaler Commonly known as: Symbicort Inhale 2 puffs into the lungs 2 (two) times daily.   furosemide 40 MG tablet Commonly  known as: LASIX Take 1 tablet (40 mg total) by mouth every Monday, Wednesday, and Friday. What changed: when to take this   levalbuterol 45 MCG/ACT inhaler Commonly known as: Xopenex HFA Inhale 2 puffs into the lungs every 4 (four) hours as  needed for wheezing.   montelukast 10 MG tablet Commonly known as: SINGULAIR TAKE 1 TABLET BY MOUTH AT BEDTIME EVERY NIGHT What changed:   how much to take  how to take this  when to take this  additional instructions   omeprazole 20 MG capsule Commonly known as: PRILOSEC Take 1 capsule (20 mg total) by mouth daily. Take 30-60 min before first meal of the day   spironolactone 25 MG tablet Commonly known as: ALDACTONE Take 0.5 tablets (12.5 mg total) by mouth daily. What changed: how much to take   warfarin 5 MG tablet Commonly known as: COUMADIN Take as directed. If you are unsure how to take this medication, talk to your nurse or doctor. Original instructions: Take 0.5 tablets (2.5 mg total) by mouth daily at 4 PM. Start taking on: January 31, 2020 What changed: how much to take       Follow-up Information    Dixie Dials, MD. Schedule an appointment as soon as possible for a visit in 3 day(s).   Specialty: Cardiology Why: PT/INR Contact information: [place] [address] [phone]               Time spent: Review of old chart, current chart, lab, x-ray, cardiac tests and discussion with patient over 60 minutes.  Signed: Birdie Riddle 01/30/2020, 10:31 AM

## 2020-01-30 NOTE — Progress Notes (Signed)
CRITICAL VALUE ALERT  Critical Value:  INR 4.3  Date & Time Notied:  01/30/20 at 05:30  Provider Notified: Simone Curia, MD  Orders Received/Actions taken: None

## 2020-02-01 LAB — PROTIME-INR
INR: 4.3 (ref 0.8–1.2)
Prothrombin Time: 40.3 seconds — ABNORMAL HIGH (ref 11.4–15.2)

## 2020-02-04 ENCOUNTER — Telehealth: Payer: Self-pay | Admitting: Pharmacist

## 2020-02-04 DIAGNOSIS — M25561 Pain in right knee: Secondary | ICD-10-CM | POA: Diagnosis not present

## 2020-02-04 DIAGNOSIS — I10 Essential (primary) hypertension: Secondary | ICD-10-CM | POA: Diagnosis not present

## 2020-02-04 DIAGNOSIS — M25562 Pain in left knee: Secondary | ICD-10-CM | POA: Diagnosis not present

## 2020-02-04 DIAGNOSIS — E6609 Other obesity due to excess calories: Secondary | ICD-10-CM | POA: Diagnosis not present

## 2020-02-04 NOTE — Telephone Encounter (Signed)
Received call from Dr Doylene Canard that he will be managing patient's INR now. Pt had been scheduled for INR check this afternoon, this has been canceled. He checked INR in his clinic today and will follow pt moving forward.

## 2020-02-06 ENCOUNTER — Ambulatory Visit (INDEPENDENT_AMBULATORY_CARE_PROVIDER_SITE_OTHER): Payer: Medicare HMO | Admitting: *Deleted

## 2020-02-06 DIAGNOSIS — I495 Sick sinus syndrome: Secondary | ICD-10-CM

## 2020-02-06 LAB — CUP PACEART REMOTE DEVICE CHECK
Battery Remaining Longevity: 1 mo
Battery Remaining Percentage: 0.5 %
Battery Voltage: 2.59 V
Brady Statistic AP VP Percent: 29 %
Brady Statistic AP VS Percent: 1 %
Brady Statistic AS VP Percent: 71 %
Brady Statistic AS VS Percent: 1 %
Brady Statistic RA Percent Paced: 28 %
Brady Statistic RV Percent Paced: 99 %
Date Time Interrogation Session: 20210702022747
Implantable Lead Implant Date: 20120702
Implantable Lead Implant Date: 20120702
Implantable Lead Location: 753859
Implantable Lead Location: 753860
Implantable Pulse Generator Implant Date: 20120702
Lead Channel Impedance Value: 350 Ohm
Lead Channel Impedance Value: 400 Ohm
Lead Channel Pacing Threshold Amplitude: 0.75 V
Lead Channel Pacing Threshold Amplitude: 1.5 V
Lead Channel Pacing Threshold Pulse Width: 0.4 ms
Lead Channel Pacing Threshold Pulse Width: 0.5 ms
Lead Channel Sensing Intrinsic Amplitude: 12 mV
Lead Channel Sensing Intrinsic Amplitude: 3.3 mV
Lead Channel Setting Pacing Amplitude: 1.75 V
Lead Channel Setting Pacing Amplitude: 2 V
Lead Channel Setting Pacing Pulse Width: 0.5 ms
Lead Channel Setting Sensing Sensitivity: 4 mV
Pulse Gen Model: 2210
Pulse Gen Serial Number: 7254877

## 2020-02-10 DIAGNOSIS — I5022 Chronic systolic (congestive) heart failure: Secondary | ICD-10-CM | POA: Diagnosis not present

## 2020-02-10 DIAGNOSIS — I10 Essential (primary) hypertension: Secondary | ICD-10-CM | POA: Diagnosis not present

## 2020-02-10 DIAGNOSIS — J4520 Mild intermittent asthma, uncomplicated: Secondary | ICD-10-CM | POA: Diagnosis not present

## 2020-02-10 DIAGNOSIS — R0602 Shortness of breath: Secondary | ICD-10-CM | POA: Diagnosis not present

## 2020-02-10 NOTE — Progress Notes (Signed)
Remote pacemaker transmission.   

## 2020-02-20 DIAGNOSIS — R072 Precordial pain: Secondary | ICD-10-CM | POA: Diagnosis not present

## 2020-02-20 DIAGNOSIS — I10 Essential (primary) hypertension: Secondary | ICD-10-CM | POA: Diagnosis not present

## 2020-02-20 DIAGNOSIS — R0602 Shortness of breath: Secondary | ICD-10-CM | POA: Diagnosis not present

## 2020-02-20 DIAGNOSIS — J4520 Mild intermittent asthma, uncomplicated: Secondary | ICD-10-CM | POA: Diagnosis not present

## 2020-02-26 ENCOUNTER — Encounter (INDEPENDENT_AMBULATORY_CARE_PROVIDER_SITE_OTHER): Payer: Medicare HMO | Admitting: Ophthalmology

## 2020-02-28 DIAGNOSIS — I509 Heart failure, unspecified: Secondary | ICD-10-CM | POA: Diagnosis not present

## 2020-03-08 ENCOUNTER — Ambulatory Visit (INDEPENDENT_AMBULATORY_CARE_PROVIDER_SITE_OTHER): Payer: Medicare HMO | Admitting: *Deleted

## 2020-03-08 DIAGNOSIS — I495 Sick sinus syndrome: Secondary | ICD-10-CM | POA: Diagnosis not present

## 2020-03-08 LAB — CUP PACEART REMOTE DEVICE CHECK
Battery Remaining Longevity: 1 mo
Battery Remaining Percentage: 0.5 %
Battery Voltage: 2.59 V
Brady Statistic AP VP Percent: 28 %
Brady Statistic AP VS Percent: 1 %
Brady Statistic AS VP Percent: 72 %
Brady Statistic AS VS Percent: 1 %
Brady Statistic RA Percent Paced: 27 %
Brady Statistic RV Percent Paced: 99 %
Date Time Interrogation Session: 20210802024221
Implantable Lead Implant Date: 20120702
Implantable Lead Implant Date: 20120702
Implantable Lead Location: 753859
Implantable Lead Location: 753860
Implantable Pulse Generator Implant Date: 20120702
Lead Channel Impedance Value: 350 Ohm
Lead Channel Impedance Value: 410 Ohm
Lead Channel Pacing Threshold Amplitude: 0.75 V
Lead Channel Pacing Threshold Amplitude: 1.625 V
Lead Channel Pacing Threshold Pulse Width: 0.4 ms
Lead Channel Pacing Threshold Pulse Width: 0.5 ms
Lead Channel Sensing Intrinsic Amplitude: 12 mV
Lead Channel Sensing Intrinsic Amplitude: 3.5 mV
Lead Channel Setting Pacing Amplitude: 1.875
Lead Channel Setting Pacing Amplitude: 2 V
Lead Channel Setting Pacing Pulse Width: 0.5 ms
Lead Channel Setting Sensing Sensitivity: 4 mV
Pulse Gen Model: 2210
Pulse Gen Serial Number: 7254877

## 2020-03-09 NOTE — Progress Notes (Signed)
Remote ICD transmission.   

## 2020-03-22 ENCOUNTER — Telehealth: Payer: Self-pay | Admitting: Emergency Medicine

## 2020-03-22 NOTE — Telephone Encounter (Signed)
Patient notified that her device is at [facility]. She will expect call from scheduler to place her on Dr Tanna Furry schedule to discuss the gen. change.

## 2020-03-30 DIAGNOSIS — I509 Heart failure, unspecified: Secondary | ICD-10-CM | POA: Diagnosis not present

## 2020-04-05 DIAGNOSIS — E6609 Other obesity due to excess calories: Secondary | ICD-10-CM | POA: Diagnosis not present

## 2020-04-05 DIAGNOSIS — R0602 Shortness of breath: Secondary | ICD-10-CM | POA: Diagnosis not present

## 2020-04-05 DIAGNOSIS — R072 Precordial pain: Secondary | ICD-10-CM | POA: Diagnosis not present

## 2020-04-05 DIAGNOSIS — I10 Essential (primary) hypertension: Secondary | ICD-10-CM | POA: Diagnosis not present

## 2020-04-06 ENCOUNTER — Other Ambulatory Visit (HOSPITAL_COMMUNITY)
Admission: RE | Admit: 2020-04-06 | Discharge: 2020-04-06 | Disposition: A | Discharge status: Home / Self Care | Payer: Medicare HMO | Source: Ambulatory Visit | Attending: Internal Medicine | Admitting: Internal Medicine

## 2020-04-06 ENCOUNTER — Other Ambulatory Visit: Payer: Self-pay

## 2020-04-06 ENCOUNTER — Encounter: Payer: Self-pay | Admitting: Internal Medicine

## 2020-04-06 ENCOUNTER — Ambulatory Visit (INDEPENDENT_AMBULATORY_CARE_PROVIDER_SITE_OTHER): Payer: Medicare HMO | Admitting: Internal Medicine

## 2020-04-06 VITALS — BP 136/66 | HR 89 | Ht 67.5 in | Wt 245.0 lb

## 2020-04-06 DIAGNOSIS — I480 Paroxysmal atrial fibrillation: Secondary | ICD-10-CM | POA: Diagnosis not present

## 2020-04-06 DIAGNOSIS — I4891 Unspecified atrial fibrillation: Secondary | ICD-10-CM | POA: Insufficient documentation

## 2020-04-06 DIAGNOSIS — Z01812 Encounter for preprocedural laboratory examination: Secondary | ICD-10-CM | POA: Insufficient documentation

## 2020-04-06 DIAGNOSIS — I442 Atrioventricular block, complete: Secondary | ICD-10-CM | POA: Diagnosis not present

## 2020-04-06 DIAGNOSIS — Z20822 Contact with and (suspected) exposure to covid-19: Secondary | ICD-10-CM | POA: Insufficient documentation

## 2020-04-06 DIAGNOSIS — I5032 Chronic diastolic (congestive) heart failure: Secondary | ICD-10-CM | POA: Diagnosis not present

## 2020-04-06 DIAGNOSIS — Z950 Presence of cardiac pacemaker: Secondary | ICD-10-CM | POA: Diagnosis not present

## 2020-04-06 LAB — CBC WITH DIFFERENTIAL/PLATELET
Basophils Absolute: 0 10*3/uL (ref 0.0–0.2)
Basos: 0 %
EOS (ABSOLUTE): 0.2 10*3/uL (ref 0.0–0.4)
Eos: 3 %
Hematocrit: 33.4 % — ABNORMAL LOW (ref 34.0–46.6)
Hemoglobin: 11 g/dL — ABNORMAL LOW (ref 11.1–15.9)
Lymphocytes Absolute: 2 10*3/uL (ref 0.7–3.1)
Lymphs: 32 %
MCH: 27.8 pg (ref 26.6–33.0)
MCHC: 32.9 g/dL (ref 31.5–35.7)
MCV: 84 fL (ref 79–97)
Monocytes Absolute: 0.6 10*3/uL (ref 0.1–0.9)
Monocytes: 9 %
Neutrophils Absolute: 3.6 10*3/uL (ref 1.4–7.0)
Neutrophils: 56 %
Platelets: 213 10*3/uL (ref 150–450)
RBC: 3.96 x10E6/uL (ref 3.77–5.28)
RDW: 17.1 % — ABNORMAL HIGH (ref 11.7–15.4)
WBC: 6.4 10*3/uL (ref 3.4–10.8)

## 2020-04-06 LAB — BASIC METABOLIC PANEL
BUN/Creatinine Ratio: 15 (ref 12–28)
BUN: 15 mg/dL (ref 8–27)
CO2: 24 mmol/L (ref 20–29)
Calcium: 9.1 mg/dL (ref 8.7–10.3)
Chloride: 110 mmol/L — ABNORMAL HIGH (ref 96–106)
Creatinine, Ser: 1.02 mg/dL — ABNORMAL HIGH (ref 0.57–1.00)
GFR calc Af Amer: 60 mL/min/{1.73_m2} (ref >59)
GFR calc non Af Amer: 52 mL/min/{1.73_m2} — ABNORMAL LOW (ref >59)
Glucose: 104 mg/dL — ABNORMAL HIGH (ref 65–99)
Potassium: 3.8 mmol/L (ref 3.5–5.2)
Sodium: 143 mmol/L (ref 134–144)

## 2020-04-06 LAB — CUP PACEART INCLINIC DEVICE CHECK
Battery Remaining Longevity: 0 mo
Battery Voltage: 2.57 V
Brady Statistic RA Percent Paced: 25 %
Brady Statistic RV Percent Paced: 99.96 %
Date Time Interrogation Session: 20210831132626
Implantable Lead Implant Date: 20120702
Implantable Lead Implant Date: 20120702
Implantable Lead Location: 753859
Implantable Lead Location: 753860
Implantable Pulse Generator Implant Date: 20120702
Lead Channel Impedance Value: 362.5 Ohm
Lead Channel Impedance Value: 400 Ohm
Lead Channel Pacing Threshold Amplitude: 1.25 V
Lead Channel Pacing Threshold Amplitude: 1.25 V
Lead Channel Pacing Threshold Amplitude: 1.375 V
Lead Channel Pacing Threshold Pulse Width: 0.4 ms
Lead Channel Pacing Threshold Pulse Width: 0.4 ms
Lead Channel Pacing Threshold Pulse Width: 0.5 ms
Lead Channel Sensing Intrinsic Amplitude: 12 mV
Lead Channel Sensing Intrinsic Amplitude: 3.5 mV
Lead Channel Setting Pacing Amplitude: 1.625
Lead Channel Setting Pacing Amplitude: 2 V
Lead Channel Setting Pacing Pulse Width: 0.5 ms
Lead Channel Setting Sensing Sensitivity: 4 mV
Pulse Gen Model: 2210
Pulse Gen Serial Number: 7254877

## 2020-04-06 LAB — SARS CORONAVIRUS 2 (TAT 6-24 HRS): SARS Coronavirus 2: NEGATIVE

## 2020-04-06 LAB — PROTIME-INR
INR: 1.2 (ref 0.9–1.2)
Prothrombin Time: 11.8 s (ref 9.1–12.0)

## 2020-04-06 NOTE — H&P (View-Only) (Signed)
HPI [name] returns today for followup. She is a pleasant 79 yo woman with a h/o HTN, CHB, s/p PPM insertion. She has morbid obesity. She has reached ERI on her PPM. She denies chest pain or sob.  Allergies  Allergen Reactions  . Albuterol Palpitations and Other (See Comments)    irregular heartbeat     Current Outpatient Medications  Medication Sig Dispense Refill  . acetaminophen (TYLENOL) 325 MG tablet Take 2 tablets (650 mg total) by mouth every 4 (four) hours as needed for headache or mild pain.    Marland Kitchen atorvastatin (LIPITOR) 20 MG tablet Take 20 mg by mouth daily.    . budesonide-formoterol (SYMBICORT) 80-4.5 MCG/ACT inhaler Inhale 2 puffs into the lungs 2 (two) times daily. 3 Inhaler 1  . furosemide (LASIX) 40 MG tablet Take 1 tablet (40 mg total) by mouth every Monday, Wednesday, and Friday. 30 tablet   . levalbuterol (XOPENEX HFA) 45 MCG/ACT inhaler Inhale 2 puffs into the lungs every 4 (four) hours as needed for wheezing. 3 Inhaler 1  . montelukast (SINGULAIR) 10 MG tablet TAKE 1 TABLET BY MOUTH AT BEDTIME EVERY NIGHT 30 tablet 2  . omeprazole (PRILOSEC) 20 MG capsule Take 1 capsule (20 mg total) by mouth daily. Take 30-60 min before first meal of the day 30 capsule 5  . spironolactone (ALDACTONE) 25 MG tablet Take 0.5 tablets (12.5 mg total) by mouth daily.    Marland Kitchen warfarin (COUMADIN) 5 MG tablet Take 0.5 tablets (2.5 mg total) by mouth daily at 4 PM.     No current facility-administered medications for this visit.     Past Medical History:  Diagnosis Date  . Allergic rhinitis   . Anemia    hx due to med  . Arthritis   . Asthma   . CHF (congestive heart failure) ([place])   . Dyspnea   . Emphysema of lung ([place])   . GERD (gastroesophageal reflux disease)   . Hyperlipidemia   . Hypertension   . Pacemaker   . PAT (paroxysmal atrial tachycardia) ([place]) 2008  . Pneumonia    hx  . 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   Past Surgical History:  Procedure Laterality Date  . ABDOMINAL HYSTERECTOMY    . BREAST BIOPSY  04/30/2012   Procedure: BREAST BIOPSY WITH NEEDLE LOCALIZATION;  Surgeon: Rolm Bookbinder, MD;  Location: [place];  Service: General;  Laterality: Left;  Left breast wire localization biospy  . HEMORRHOID SURGERY    . PACEMAKER INSERTION  02-06-2011  . TAH and BSO    . TONSILLECTOMY       Family History  Problem Relation Age of Onset  . Heart disease Mother   . Cancer Sister        breast     Social History   Socioeconomic History  . Marital status: Widowed    Spouse name: Not on file  . Number of children: 2  . Years of education: Not on file  . Highest education level: Not on file  Occupational History  . Occupation: cna  Tobacco Use  . Smoking status: Former Smoker    Packs/day: 1.00    Years: 4.00    Pack years: 4.00    Types: Cigarettes    Quit date: 08/07/1977    Years since quitting: 42.6  .  Smokeless tobacco: Never Used  Vaping Use  . Vaping Use: Never used  Substance and Sexual Activity  . Alcohol use: No    Alcohol/week: 0.0 standard drinks    Comment: quit 1979  . Drug use: No  . Sexual activity: Not on file  Other Topics Concern  . Not on file  Social History Narrative   Pt has 11 siblings in all   Social Determinants of Health   Financial Resource Strain:   . Difficulty of Paying Living Expenses: Not on file  Food Insecurity:   . Worried About Charity fundraiser in the Last Year: Not on file  . Ran Out of Food in the Last Year: Not on file  Transportation Needs:   . Lack of Transportation (Medical): Not on file  . Lack of Transportation (Non-Medical): Not on file  Physical Activity:   . Days of Exercise per Week: Not on file  . Minutes of Exercise per Session: Not on file  Stress:   . Feeling of Stress : Not on file  Social Connections:   . Frequency of Communication with Friends and Family:  Not on file  . Frequency of Social Gatherings with Friends and Family: Not on file  . Attends Religious Services: Not on file  . Active Member of Clubs or Organizations: Not on file  . Attends Archivist Meetings: Not on file  . Marital Status: Not on file  Intimate Partner Violence:   . Fear of Current or Ex-Partner: Not on file  . Emotionally Abused: Not on file  . Physically Abused: Not on file  . Sexually Abused: Not on file     BP 136/66   Pulse 89   Ht 5' 7.5" (1.715 m)   Wt 245 lb (111.1 kg)   SpO2 92%   BMI 37.81 kg/m   Physical Exam:  Well appearing NAD HEENT: Unremarkable Neck:  No JVD, no thyromegally Lymphatics:  No adenopathy Back:  No CVA tenderness Lungs:  Clear with no wheezes HEART:  Regular rate rhythm, no murmurs, no rubs, no clicks Abd:  soft, positive bowel sounds, no organomegally, no rebound, no guarding Ext:  2 plus pulses, no edema, no cyanosis, no clubbing Skin:  No rashes no nodules Neuro:  CN II through XII intact, motor grossly intact  DEVICE  Normal device function.  See PaceArt for details. ERI  Assess/Plan: 1. CHB - she is asymptomatic, s/p PPM insertion.  2. PPM - she has reached ERI. I have recommended she undergo PPM gen change out. 3. HTN - her bp is fairly well controlled. No change in meds.  Carleene Overlie Jesicca Dipierro,MD

## 2020-04-06 NOTE — Progress Notes (Signed)
HPI [name] returns today for followup. She is a pleasant 79 yo woman with a h/o HTN, CHB, s/p PPM insertion. She has morbid obesity. She has reached ERI on her PPM. She denies chest pain or sob.  Allergies  Allergen Reactions   Albuterol Palpitations and Other (See Comments)    irregular heartbeat     Current Outpatient Medications  Medication Sig Dispense Refill   acetaminophen (TYLENOL) 325 MG tablet Take 2 tablets (650 mg total) by mouth every 4 (four) hours as needed for headache or mild pain.     atorvastatin (LIPITOR) 20 MG tablet Take 20 mg by mouth daily.     budesonide-formoterol (SYMBICORT) 80-4.5 MCG/ACT inhaler Inhale 2 puffs into the lungs 2 (two) times daily. 3 Inhaler 1   furosemide (LASIX) 40 MG tablet Take 1 tablet (40 mg total) by mouth every Monday, Wednesday, and Friday. 30 tablet    levalbuterol (XOPENEX HFA) 45 MCG/ACT inhaler Inhale 2 puffs into the lungs every 4 (four) hours as needed for wheezing. 3 Inhaler 1   montelukast (SINGULAIR) 10 MG tablet TAKE 1 TABLET BY MOUTH AT BEDTIME EVERY NIGHT 30 tablet 2   omeprazole (PRILOSEC) 20 MG capsule Take 1 capsule (20 mg total) by mouth daily. Take 30-60 min before first meal of the day 30 capsule 5   spironolactone (ALDACTONE) 25 MG tablet Take 0.5 tablets (12.5 mg total) by mouth daily.     warfarin (COUMADIN) 5 MG tablet Take 0.5 tablets (2.5 mg total) by mouth daily at 4 PM.     No current facility-administered medications for this visit.     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   Past Surgical History:  Procedure Laterality Date   ABDOMINAL HYSTERECTOMY     BREAST BIOPSY  04/30/2012   Procedure: BREAST BIOPSY WITH NEEDLE LOCALIZATION;  Surgeon: Rolm Bookbinder, MD;  Location: [facility];  Service: General;  Laterality: Left;  Left breast wire localization biospy   HEMORRHOID SURGERY     PACEMAKER INSERTION  02-06-2011   TAH and BSO     TONSILLECTOMY       Family History  Problem Relation Age of Onset   Heart disease Mother    Cancer Sister        breast     Social History   Socioeconomic History   Marital status: Widowed    Spouse name: Not on file   Number of children: 2   Years of education: Not on file   Highest education level: Not on file  Occupational History   Occupation: cna  Tobacco Use   Smoking status: Former Smoker    Packs/day: 1.00    Years: 4.00    Pack years: 4.00    Types: Cigarettes    Quit date: 08/07/1977    Years since quitting: 81.6  Smokeless tobacco: Never Used  Vaping Use   Vaping Use: Never used  Substance and Sexual Activity   Alcohol use: No    Alcohol/week: 0.0 standard drinks    Comment: quit 1979   Drug use: No   Sexual activity: Not on file  Other Topics Concern   Not on file  Social History Narrative   Pt has 11 siblings in all   Social Determinants of Health   Financial Resource Strain:    Difficulty of Paying Living Expenses: Not on file  Food Insecurity:    Worried About Charity fundraiser in the Last Year: Not on file   [employer] of Food in the Last Year: Not on file  Transportation Needs:    Lack of Transportation (Medical): Not on file   Lack of Transportation (Non-Medical): Not on file  Physical Activity:    Days of Exercise per Week: Not on file   Minutes of Exercise per Session: Not on file  Stress:    Feeling of Stress : Not on file  Social Connections:    Frequency of Communication with Friends and Family:  Not on file   Frequency of Social Gatherings with Friends and Family: Not on file   Attends Religious Services: Not on file   Active Member of [place] or Organizations: Not on file   Attends Archivist Meetings: Not on file   Marital Status: Not on file  Intimate Partner Violence:    Fear of Current or Ex-Partner: Not on file   Emotionally Abused: Not on file   Physically Abused: Not on file   Sexually Abused: Not on file     BP 136/66    Pulse 89    Ht 5' 7.5" (1.715 m)    Wt 245 lb (111.1 kg)    SpO2 92%    BMI 37.81 kg/m   Physical Exam:  Well appearing NAD HEENT: Unremarkable Neck:  No JVD, no thyromegally Lymphatics:  No adenopathy Back:  No CVA tenderness Lungs:  Clear with no wheezes HEART:  Regular rate rhythm, no murmurs, no rubs, no clicks Abd:  soft, positive bowel sounds, no organomegally, no rebound, no guarding Ext:  2 plus pulses, no edema, no cyanosis, no clubbing Skin:  No rashes no nodules Neuro:  CN II through XII intact, motor grossly intact  DEVICE  Normal device function.  See PaceArt for details. ERI  Assess/Plan: 1. CHB - she is asymptomatic, s/p PPM insertion.  2. PPM - she has reached ERI. I have recommended she undergo PPM gen change out. 3. HTN - her bp is fairly well controlled. No change in meds.  Carleene Overlie [name],MD

## 2020-04-06 NOTE — Patient Instructions (Addendum)
Medication Instructions:  Your physician recommends that you continue on your current medications as directed. Please refer to the Current Medication list given to you today.  Labwork: You will get lab work today:  BMP, CBC, INR  Testing/Procedures: None ordered.  Follow-Up:  SEE INSTRUCTION LETTER  Any Other Special Instructions Will Be Listed Below (If Applicable).  If you need a refill on your cardiac medications before your next appointment, please call your pharmacy.    Pacemaker Battery Change  A pacemaker battery usually lasts 5-15 years (6-7 years on average). A few times a year, you will be asked to visit your health care provider to have a full evaluation of your pacemaker. When the battery is low, your pacemaker battery and generator will be completely replaced. Most often, this procedure is simpler than the first surgery because the wires (leads) that connect the generator to the heart are already in place. There are many things that affect how long a pacemaker battery will last, including:  The age of the pacemaker.  The number of leads you have(1, 2, or 3).  The pacemaker workload. If the pacemaker is helping the heart more often, the battery will not last as long.  Power (voltage) settings. Tell a health care provider about:  Any allergies you have.  All medicines you are taking, including vitamins, herbs, eye drops, creams, and over-the-counter medicines.  Any problems you or family members have had with anesthetic medicines.  Any blood disorders you have.  Any surgeries you have had, especially the surgeries you have had since your last pacemaker was placed.  Any medical conditions you have.  Whether you are pregnant or may be pregnant.  Any symptoms of heart problems, such as chest pain, trouble breathing, palpitations, light-headedness, or feelings of an abnormal or irregular heartbeat.  Smoking habits. This can affect your reaction to  anesthesia. What are the risks? Generally, this is a safe procedure. However, problems may occur, including:  Bleeding.  Bruising of the skin around where the surgical cut (incision) was made.  Pulling apart of the skin at the incision site.  Infection.  Nerve damage.  Injury to other organs, such as the lungs.  Allergic reaction to anesthetics or other medicines used during the procedure.  People with diabetes may have a temporary increase in blood sugar (glucose) after any surgical procedure. What happens before the procedure? Staying hydrated Follow instructions from your health care provider about hydration, which may include:  Up to 2 hours before the procedure - you may continue to drink clear liquids, such as water, clear fruit juice, black coffee, and plain tea. Eating and drinking restrictions Follow instructions from your health care provider about eating and drinking restrictions, which may include:  8 hours before the procedure - stop eating heavy meals or foods such as meat, fried foods, or fatty foods.  6 hours before the procedure - stop eating light meals or foods, such as toast or cereal.  6 hours before the procedure - stop drinking milk or drinks that contain milk.  2 hours before the procedure - stop drinking clear liquids. General instructions  Ask your health care provider about: ? Changing or stopping your regular medicines. This is especially important if you are taking diabetes medicines or blood thinners. ? Taking medicines such as aspirin and ibuprofen. These medicines can thin your blood. Do not take these medicines before your procedure if your health care provider instructs you not to. ? Taking a sip of water  with any approved medicines on the morning of the procedure.  Plan to have someone take you home after the procedure. What happens during the procedure?  To reduce your risk of infection: ? Your health care team will wash or sanitize  their hands. ? The skin around the area of the chest will be washed with soap. ? Hair may be removed from the surgical area.  An IV tube will be inserted into one your veins to give you medicine and fluids.  You will be given one or more of the following: ? A medicine to help you relax (sedative). ? A medicine to numb the area where the pacemaker is located (local anesthetic).  You may be given antibiotic medicine to prevent infection.  Your health care provider will make an incision to reopen the pocket holding the pacemaker.  The old pacemaker will be disconnected from the leads.  The leads will be tested.  If needed, the leads will be replaced. If the leads are functioning properly, the new pacemaker will be connected to the existing leads.  A heart monitor and the pacemaker programmer will be used to make sure that the newly implanted pacemaker is working properly.  The incision site will be closed. A bandage (dressing) will be placed over the pacemaker site. The procedure may vary among health care providers and hospitals. What happens after the procedure?  Your blood pressure, heart rate, breathing rate, and blood oxygen level will be monitored until your health care team is satisfied that your pacemaker is working properly.  Your health care provider will tell you when your pacemaker will need to be tested again, or when to return to the office for removal of dressing and stitches.  Do not drive for 24 hours if you were given a sedative.  The dressing will be removed 24-48 hours after the procedure, or as told by your health care provider. Summary  A pacemaker battery usually lasts 5-15 years (6-7 years on average).  When the battery is low, your pacemaker battery and generator will be completely replaced.  Risks of this procedure include bleeding, bruising, infection, damage to other structures, pulling apart of the skin at the incision site, and allergic reactions to  medicines or anesthetics.  Most often, this procedure is simpler than the first surgery because the wires (leads) that connect the generator to the heart are already in place. This information is not intended to replace advice given to you by your health care provider. Make sure you discuss any questions you have with your health care provider. Document Revised: 07/06/2017 Document Reviewed: 06/27/2016 Elsevier Patient Education  2020 Elsevier Inc.  

## 2020-04-07 ENCOUNTER — Telehealth: Payer: Self-pay | Admitting: Internal Medicine

## 2020-04-07 LAB — CUP PACEART REMOTE DEVICE CHECK
Battery Remaining Longevity: 0 mo
Battery Voltage: 2.57 V
Brady Statistic AP VP Percent: 2 %
Brady Statistic AP VS Percent: 0 %
Brady Statistic AS VP Percent: 98 %
Brady Statistic AS VS Percent: 1 %
Brady Statistic RA Percent Paced: 2 %
Brady Statistic RV Percent Paced: 99 %
Date Time Interrogation Session: 20210901025243
Implantable Lead Implant Date: 20120702
Implantable Lead Implant Date: 20120702
Implantable Lead Location: 753859
Implantable Lead Location: 753860
Implantable Pulse Generator Implant Date: 20120702
Lead Channel Impedance Value: 360 Ohm
Lead Channel Impedance Value: 400 Ohm
Lead Channel Pacing Threshold Amplitude: 1.25 V
Lead Channel Pacing Threshold Amplitude: 1.5 V
Lead Channel Pacing Threshold Pulse Width: 0.4 ms
Lead Channel Pacing Threshold Pulse Width: 0.5 ms
Lead Channel Sensing Intrinsic Amplitude: 12 mV
Lead Channel Sensing Intrinsic Amplitude: 3.5 mV
Lead Channel Setting Pacing Amplitude: 1.75 V
Lead Channel Setting Pacing Amplitude: 2 V
Lead Channel Setting Pacing Pulse Width: 0.5 ms
Lead Channel Setting Sensing Sensitivity: 4 mV
Pulse Gen Model: 2210
Pulse Gen Serial Number: 7254877

## 2020-04-07 NOTE — Telephone Encounter (Signed)
Returned call to Pt.  Gave Pt upcoming procedure days.  She will discuss with her family and this nurse will call her back this afternoon

## 2020-04-07 NOTE — Telephone Encounter (Signed)
Pt rescheduled for pacemaker gen change on 04/19/20  covid test scheduled for 04/17/20  Will send updated instruction letter to pt as requested.

## 2020-04-07 NOTE — Telephone Encounter (Signed)
Patient is requesting to reschedule gen change scheduled for 04/08/20 with Dr. Lovena Le. Please assist.

## 2020-04-08 ENCOUNTER — Ambulatory Visit (INDEPENDENT_AMBULATORY_CARE_PROVIDER_SITE_OTHER): Payer: Medicare HMO | Admitting: *Deleted

## 2020-04-08 DIAGNOSIS — I495 Sick sinus syndrome: Secondary | ICD-10-CM

## 2020-04-08 NOTE — Addendum Note (Signed)
Addended by: Rose Phi on: 04/08/2020 01:54 PM   Modules accepted: Orders

## 2020-04-09 NOTE — Progress Notes (Signed)
Remote pacemaker transmission.   

## 2020-04-16 NOTE — Progress Notes (Signed)
Attempted to call patient regarding procedure on Monday.  Left voice mail, nothing to eat or drink after midnight, wash with special soap Sunday night and Monday morning.  Hold the coumadin, last dose today.  Need responsible adult to drive you home and stay overnight with you.  Arrival time is 9:30

## 2020-04-17 ENCOUNTER — Other Ambulatory Visit (HOSPITAL_COMMUNITY): Payer: Medicare HMO

## 2020-04-19 ENCOUNTER — Ambulatory Visit (HOSPITAL_COMMUNITY)
Admission: RE | Admit: 2020-04-19 | Discharge: 2020-04-19 | Disposition: A | Discharge status: Home / Self Care | Payer: Medicare HMO | Attending: Internal Medicine | Admitting: Internal Medicine

## 2020-04-19 ENCOUNTER — Other Ambulatory Visit: Payer: Self-pay

## 2020-04-19 ENCOUNTER — Encounter (HOSPITAL_COMMUNITY)
Admission: RE | Disposition: A | Discharge status: Home / Self Care | Payer: Medicare HMO | Source: Home / Self Care | Attending: Internal Medicine

## 2020-04-19 DIAGNOSIS — I110 Hypertensive heart disease with heart failure: Secondary | ICD-10-CM | POA: Diagnosis not present

## 2020-04-19 DIAGNOSIS — Z6837 Body mass index (BMI) 37.0-37.9, adult: Secondary | ICD-10-CM | POA: Insufficient documentation

## 2020-04-19 DIAGNOSIS — Z45010 Encounter for checking and testing of cardiac pacemaker pulse generator [battery]: Secondary | ICD-10-CM

## 2020-04-19 DIAGNOSIS — Z79899 Other long term (current) drug therapy: Secondary | ICD-10-CM | POA: Insufficient documentation

## 2020-04-19 DIAGNOSIS — Z7951 Long term (current) use of inhaled steroids: Secondary | ICD-10-CM | POA: Diagnosis not present

## 2020-04-19 DIAGNOSIS — I509 Heart failure, unspecified: Secondary | ICD-10-CM | POA: Insufficient documentation

## 2020-04-19 DIAGNOSIS — E785 Hyperlipidemia, unspecified: Secondary | ICD-10-CM | POA: Insufficient documentation

## 2020-04-19 DIAGNOSIS — Z20822 Contact with and (suspected) exposure to covid-19: Secondary | ICD-10-CM | POA: Insufficient documentation

## 2020-04-19 DIAGNOSIS — Z7901 Long term (current) use of anticoagulants: Secondary | ICD-10-CM | POA: Diagnosis not present

## 2020-04-19 DIAGNOSIS — I442 Atrioventricular block, complete: Secondary | ICD-10-CM

## 2020-04-19 DIAGNOSIS — K219 Gastro-esophageal reflux disease without esophagitis: Secondary | ICD-10-CM | POA: Insufficient documentation

## 2020-04-19 DIAGNOSIS — J45909 Unspecified asthma, uncomplicated: Secondary | ICD-10-CM | POA: Insufficient documentation

## 2020-04-19 DIAGNOSIS — Z87891 Personal history of nicotine dependence: Secondary | ICD-10-CM | POA: Diagnosis not present

## 2020-04-19 HISTORY — PX: PPM GENERATOR CHANGEOUT: EP1233

## 2020-04-19 LAB — PROTIME-INR
INR: 1.2 (ref 0.8–1.2)
Prothrombin Time: 15 seconds (ref 11.4–15.2)

## 2020-04-19 LAB — SARS CORONAVIRUS 2 BY RT PCR (HOSPITAL ORDER, PERFORMED IN ~~LOC~~ HOSPITAL LAB): SARS Coronavirus 2: NEGATIVE

## 2020-04-19 SURGERY — PPM GENERATOR CHANGEOUT
Anesthesia: LOCAL

## 2020-04-19 MED ORDER — SODIUM CHLORIDE 0.9 % IV SOLN
80.0000 mg | INTRAVENOUS | Status: AC
  Administered 2020-04-19: 80 mg

## 2020-04-19 MED ORDER — CEFAZOLIN SODIUM-DEXTROSE 2-4 GM/100ML-% IV SOLN
INTRAVENOUS | Status: AC
  Filled 2020-04-19: qty 100

## 2020-04-19 MED ORDER — FENTANYL CITRATE (PF) 100 MCG/2ML IJ SOLN
INTRAMUSCULAR | Status: DC | PRN
Start: 2020-04-19 — End: 2020-04-19
  Administered 2020-04-19: 25 ug via INTRAVENOUS

## 2020-04-19 MED ORDER — CEFAZOLIN SODIUM-DEXTROSE 2-4 GM/100ML-% IV SOLN
2.0000 g | INTRAVENOUS | Status: AC
  Administered 2020-04-19: 2 g via INTRAVENOUS

## 2020-04-19 MED ORDER — MIDAZOLAM HCL 5 MG/5ML IJ SOLN
INTRAMUSCULAR | Status: AC
  Filled 2020-04-19: qty 5

## 2020-04-19 MED ORDER — LIDOCAINE HCL (PF) 1 % IJ SOLN
INTRAMUSCULAR | Status: DC | PRN
  Administered 2020-04-19: 60 mL

## 2020-04-19 MED ORDER — CHLORHEXIDINE GLUCONATE 4 % EX LIQD: 4.0000 "application " | Freq: Once | CUTANEOUS | Status: DC

## 2020-04-19 MED ORDER — MIDAZOLAM HCL 5 MG/5ML IJ SOLN
INTRAMUSCULAR | Status: DC | PRN
  Administered 2020-04-19: 2 mg via INTRAVENOUS

## 2020-04-19 MED ORDER — ONDANSETRON HCL 4 MG/2ML IJ SOLN: 4.0000 mg | Freq: Four times a day (QID) | INTRAMUSCULAR | Status: DC | PRN

## 2020-04-19 MED ORDER — LIDOCAINE HCL (PF) 1 % IJ SOLN
INTRAMUSCULAR | Status: AC
  Filled 2020-04-19: qty 60

## 2020-04-19 MED ORDER — SODIUM CHLORIDE 0.9 % IV SOLN: INTRAVENOUS | Status: DC

## 2020-04-19 MED ORDER — SODIUM CHLORIDE 0.9 % IV SOLN
INTRAVENOUS | Status: AC
  Filled 2020-04-19: qty 2

## 2020-04-19 MED ORDER — ACETAMINOPHEN 325 MG PO TABS: 325.0000 mg | ORAL_TABLET | ORAL | Status: DC | PRN

## 2020-04-19 MED ORDER — FENTANYL CITRATE (PF) 100 MCG/2ML IJ SOLN
INTRAMUSCULAR | Status: AC
  Filled 2020-04-19: qty 2

## 2020-04-19 SURGICAL SUPPLY — 4 items
CABLE SURGICAL S-101-97-12 (CABLE) ×2 IMPLANT
PACEMAKER ASSURITY DR-RF (Pacemaker) ×1 IMPLANT
PAD PRO RADIOLUCENT 2001M-C (PAD) ×2 IMPLANT
TRAY PACEMAKER INSERTION (PACKS) ×2 IMPLANT

## 2020-04-19 NOTE — Discharge Instructions (Signed)

## 2020-04-19 NOTE — Progress Notes (Signed)
Patient and son was given discharge instructions. Both verbalized understanding. 

## 2020-04-19 NOTE — Interval H&P Note (Signed)
History and Physical Interval Note:  04/19/2020 12:15 PM  [name]  has presented today for surgery, with the diagnosis of eri.  The various methods of treatment have been discussed with the patient and family. After consideration of risks, benefits and other options for treatment, the patient has consented to  Procedure(s): PPM GENERATOR CHANGEOUT (N/A) as a surgical intervention.  The patient's history has been reviewed, patient examined, no change in status, stable for surgery.  I have reviewed the patient's chart and labs.  Questions were answered to the patient's satisfaction.     Cristopher Peru

## 2020-04-20 ENCOUNTER — Encounter (HOSPITAL_COMMUNITY): Payer: Self-pay | Admitting: Internal Medicine

## 2020-04-29 ENCOUNTER — Other Ambulatory Visit: Payer: Self-pay

## 2020-04-29 ENCOUNTER — Ambulatory Visit (INDEPENDENT_AMBULATORY_CARE_PROVIDER_SITE_OTHER): Payer: Medicare HMO | Admitting: Emergency Medicine

## 2020-04-29 DIAGNOSIS — I442 Atrioventricular block, complete: Secondary | ICD-10-CM

## 2020-04-29 DIAGNOSIS — I495 Sick sinus syndrome: Secondary | ICD-10-CM

## 2020-04-29 LAB — CUP PACEART INCLINIC DEVICE CHECK
Battery Remaining Longevity: 114 mo
Battery Voltage: 3.14 V
Brady Statistic RA Percent Paced: 27 %
Brady Statistic RV Percent Paced: 99.96 %
Date Time Interrogation Session: 20210923141722
Implantable Lead Implant Date: 20120702
Implantable Lead Implant Date: 20120702
Implantable Lead Location: 753859
Implantable Lead Location: 753860
Implantable Pulse Generator Implant Date: 20210913
Lead Channel Impedance Value: 362.5 Ohm
Lead Channel Impedance Value: 412.5 Ohm
Lead Channel Pacing Threshold Amplitude: 0.875 V
Lead Channel Pacing Threshold Amplitude: 1.375 V
Lead Channel Pacing Threshold Pulse Width: 0.4 ms
Lead Channel Pacing Threshold Pulse Width: 0.5 ms
Lead Channel Sensing Intrinsic Amplitude: 3.5 mV
Lead Channel Setting Pacing Amplitude: 1.625
Lead Channel Setting Pacing Amplitude: 1.875
Lead Channel Setting Pacing Pulse Width: 0.5 ms
Lead Channel Setting Sensing Sensitivity: 4 mV
Pulse Gen Model: 2272
Pulse Gen Serial Number: 3864080

## 2020-04-29 NOTE — Progress Notes (Signed)
Wound check appointment. Steri-strips removed. Wound without redness or edema. Incision edges approximated, wound well healed. Normal device function. Thresholds, RA sensing, and impedances consistent with implant measurements. Device auto capture programmed on for extra safety margin until 3 month visit. Histogram distribution appropriate for patient and level of activity. 52 AMS episodes recorded, available EGMs reviewed, longest/ fastest episode was 8 minutes with Peak A rate of 208.  Patient is on Coumadin for [place].  No high ventricular rates noted.  PMT episode reviewed, appears false.   Patient educated about wound care, arm mobility, lifting restrictions. Patient is enrolled in remote monitoring, next scheduled check 07/19/20.  [name] with Dr. Lovena Le on 07/23/20.

## 2020-04-29 NOTE — Patient Instructions (Addendum)
Wash area with soap and water only.  Send manual transmission from bedside monitor when you get home.  If any trouble with sending transmission call us at [phone]

## 2020-04-30 DIAGNOSIS — I509 Heart failure, unspecified: Secondary | ICD-10-CM | POA: Diagnosis not present

## 2020-05-06 DIAGNOSIS — R0602 Shortness of breath: Secondary | ICD-10-CM | POA: Diagnosis not present

## 2020-05-06 DIAGNOSIS — I5022 Chronic systolic (congestive) heart failure: Secondary | ICD-10-CM | POA: Diagnosis not present

## 2020-05-06 DIAGNOSIS — E6609 Other obesity due to excess calories: Secondary | ICD-10-CM | POA: Diagnosis not present

## 2020-05-06 DIAGNOSIS — I10 Essential (primary) hypertension: Secondary | ICD-10-CM | POA: Diagnosis not present

## 2020-05-30 DIAGNOSIS — I509 Heart failure, unspecified: Secondary | ICD-10-CM | POA: Diagnosis not present

## 2020-06-18 DIAGNOSIS — I10 Essential (primary) hypertension: Secondary | ICD-10-CM | POA: Diagnosis not present

## 2020-06-18 DIAGNOSIS — I509 Heart failure, unspecified: Secondary | ICD-10-CM | POA: Diagnosis not present

## 2020-06-18 DIAGNOSIS — R634 Abnormal weight loss: Secondary | ICD-10-CM | POA: Diagnosis not present

## 2020-06-18 DIAGNOSIS — E6609 Other obesity due to excess calories: Secondary | ICD-10-CM | POA: Diagnosis not present

## 2020-06-18 DIAGNOSIS — I110 Hypertensive heart disease with heart failure: Secondary | ICD-10-CM | POA: Diagnosis not present

## 2020-06-18 DIAGNOSIS — R072 Precordial pain: Secondary | ICD-10-CM | POA: Diagnosis not present

## 2020-06-18 DIAGNOSIS — M1990 Unspecified osteoarthritis, unspecified site: Secondary | ICD-10-CM | POA: Diagnosis not present

## 2020-06-18 DIAGNOSIS — Z6837 Body mass index (BMI) 37.0-37.9, adult: Secondary | ICD-10-CM | POA: Diagnosis not present

## 2020-06-18 DIAGNOSIS — G8929 Other chronic pain: Secondary | ICD-10-CM | POA: Diagnosis not present

## 2020-06-18 DIAGNOSIS — R0602 Shortness of breath: Secondary | ICD-10-CM | POA: Diagnosis not present

## 2020-06-18 DIAGNOSIS — R32 Unspecified urinary incontinence: Secondary | ICD-10-CM | POA: Diagnosis not present

## 2020-06-18 DIAGNOSIS — J4520 Mild intermittent asthma, uncomplicated: Secondary | ICD-10-CM | POA: Diagnosis not present

## 2020-06-21 DIAGNOSIS — M1990 Unspecified osteoarthritis, unspecified site: Secondary | ICD-10-CM | POA: Diagnosis not present

## 2020-06-21 DIAGNOSIS — E6609 Other obesity due to excess calories: Secondary | ICD-10-CM | POA: Diagnosis not present

## 2020-06-21 DIAGNOSIS — R32 Unspecified urinary incontinence: Secondary | ICD-10-CM | POA: Diagnosis not present

## 2020-06-21 DIAGNOSIS — R634 Abnormal weight loss: Secondary | ICD-10-CM | POA: Diagnosis not present

## 2020-06-21 DIAGNOSIS — Z6837 Body mass index (BMI) 37.0-37.9, adult: Secondary | ICD-10-CM | POA: Diagnosis not present

## 2020-06-21 DIAGNOSIS — I509 Heart failure, unspecified: Secondary | ICD-10-CM | POA: Diagnosis not present

## 2020-06-21 DIAGNOSIS — G8929 Other chronic pain: Secondary | ICD-10-CM | POA: Diagnosis not present

## 2020-06-21 DIAGNOSIS — J4520 Mild intermittent asthma, uncomplicated: Secondary | ICD-10-CM | POA: Diagnosis not present

## 2020-06-21 DIAGNOSIS — I110 Hypertensive heart disease with heart failure: Secondary | ICD-10-CM | POA: Diagnosis not present

## 2020-06-22 DIAGNOSIS — J4520 Mild intermittent asthma, uncomplicated: Secondary | ICD-10-CM | POA: Diagnosis not present

## 2020-06-22 DIAGNOSIS — Z6837 Body mass index (BMI) 37.0-37.9, adult: Secondary | ICD-10-CM | POA: Diagnosis not present

## 2020-06-22 DIAGNOSIS — M1990 Unspecified osteoarthritis, unspecified site: Secondary | ICD-10-CM | POA: Diagnosis not present

## 2020-06-22 DIAGNOSIS — R32 Unspecified urinary incontinence: Secondary | ICD-10-CM | POA: Diagnosis not present

## 2020-06-22 DIAGNOSIS — R634 Abnormal weight loss: Secondary | ICD-10-CM | POA: Diagnosis not present

## 2020-06-22 DIAGNOSIS — I110 Hypertensive heart disease with heart failure: Secondary | ICD-10-CM | POA: Diagnosis not present

## 2020-06-22 DIAGNOSIS — I509 Heart failure, unspecified: Secondary | ICD-10-CM | POA: Diagnosis not present

## 2020-06-22 DIAGNOSIS — G8929 Other chronic pain: Secondary | ICD-10-CM | POA: Diagnosis not present

## 2020-06-22 DIAGNOSIS — E6609 Other obesity due to excess calories: Secondary | ICD-10-CM | POA: Diagnosis not present

## 2020-06-24 DIAGNOSIS — J4520 Mild intermittent asthma, uncomplicated: Secondary | ICD-10-CM | POA: Diagnosis not present

## 2020-06-24 DIAGNOSIS — R634 Abnormal weight loss: Secondary | ICD-10-CM | POA: Diagnosis not present

## 2020-06-24 DIAGNOSIS — I110 Hypertensive heart disease with heart failure: Secondary | ICD-10-CM | POA: Diagnosis not present

## 2020-06-24 DIAGNOSIS — R32 Unspecified urinary incontinence: Secondary | ICD-10-CM | POA: Diagnosis not present

## 2020-06-24 DIAGNOSIS — E6609 Other obesity due to excess calories: Secondary | ICD-10-CM | POA: Diagnosis not present

## 2020-06-24 DIAGNOSIS — Z6837 Body mass index (BMI) 37.0-37.9, adult: Secondary | ICD-10-CM | POA: Diagnosis not present

## 2020-06-24 DIAGNOSIS — M1990 Unspecified osteoarthritis, unspecified site: Secondary | ICD-10-CM | POA: Diagnosis not present

## 2020-06-24 DIAGNOSIS — I509 Heart failure, unspecified: Secondary | ICD-10-CM | POA: Diagnosis not present

## 2020-06-24 DIAGNOSIS — G8929 Other chronic pain: Secondary | ICD-10-CM | POA: Diagnosis not present

## 2020-06-25 DIAGNOSIS — J4520 Mild intermittent asthma, uncomplicated: Secondary | ICD-10-CM | POA: Diagnosis not present

## 2020-06-25 DIAGNOSIS — R634 Abnormal weight loss: Secondary | ICD-10-CM | POA: Diagnosis not present

## 2020-06-25 DIAGNOSIS — I110 Hypertensive heart disease with heart failure: Secondary | ICD-10-CM | POA: Diagnosis not present

## 2020-06-25 DIAGNOSIS — G8929 Other chronic pain: Secondary | ICD-10-CM | POA: Diagnosis not present

## 2020-06-25 DIAGNOSIS — M1990 Unspecified osteoarthritis, unspecified site: Secondary | ICD-10-CM | POA: Diagnosis not present

## 2020-06-25 DIAGNOSIS — R32 Unspecified urinary incontinence: Secondary | ICD-10-CM | POA: Diagnosis not present

## 2020-06-25 DIAGNOSIS — Z6837 Body mass index (BMI) 37.0-37.9, adult: Secondary | ICD-10-CM | POA: Diagnosis not present

## 2020-06-25 DIAGNOSIS — E6609 Other obesity due to excess calories: Secondary | ICD-10-CM | POA: Diagnosis not present

## 2020-06-25 DIAGNOSIS — I509 Heart failure, unspecified: Secondary | ICD-10-CM | POA: Diagnosis not present

## 2020-06-27 DIAGNOSIS — R32 Unspecified urinary incontinence: Secondary | ICD-10-CM | POA: Diagnosis not present

## 2020-06-27 DIAGNOSIS — I110 Hypertensive heart disease with heart failure: Secondary | ICD-10-CM | POA: Diagnosis not present

## 2020-06-27 DIAGNOSIS — G8929 Other chronic pain: Secondary | ICD-10-CM | POA: Diagnosis not present

## 2020-06-27 DIAGNOSIS — I509 Heart failure, unspecified: Secondary | ICD-10-CM | POA: Diagnosis not present

## 2020-06-27 DIAGNOSIS — M1990 Unspecified osteoarthritis, unspecified site: Secondary | ICD-10-CM | POA: Diagnosis not present

## 2020-06-27 DIAGNOSIS — E6609 Other obesity due to excess calories: Secondary | ICD-10-CM | POA: Diagnosis not present

## 2020-06-27 DIAGNOSIS — R634 Abnormal weight loss: Secondary | ICD-10-CM | POA: Diagnosis not present

## 2020-06-27 DIAGNOSIS — J4520 Mild intermittent asthma, uncomplicated: Secondary | ICD-10-CM | POA: Diagnosis not present

## 2020-06-27 DIAGNOSIS — Z6837 Body mass index (BMI) 37.0-37.9, adult: Secondary | ICD-10-CM | POA: Diagnosis not present

## 2020-06-30 DIAGNOSIS — M1990 Unspecified osteoarthritis, unspecified site: Secondary | ICD-10-CM | POA: Diagnosis not present

## 2020-06-30 DIAGNOSIS — I509 Heart failure, unspecified: Secondary | ICD-10-CM | POA: Diagnosis not present

## 2020-06-30 DIAGNOSIS — R32 Unspecified urinary incontinence: Secondary | ICD-10-CM | POA: Diagnosis not present

## 2020-06-30 DIAGNOSIS — Z6837 Body mass index (BMI) 37.0-37.9, adult: Secondary | ICD-10-CM | POA: Diagnosis not present

## 2020-06-30 DIAGNOSIS — E6609 Other obesity due to excess calories: Secondary | ICD-10-CM | POA: Diagnosis not present

## 2020-06-30 DIAGNOSIS — I110 Hypertensive heart disease with heart failure: Secondary | ICD-10-CM | POA: Diagnosis not present

## 2020-06-30 DIAGNOSIS — G8929 Other chronic pain: Secondary | ICD-10-CM | POA: Diagnosis not present

## 2020-06-30 DIAGNOSIS — R634 Abnormal weight loss: Secondary | ICD-10-CM | POA: Diagnosis not present

## 2020-06-30 DIAGNOSIS — J4520 Mild intermittent asthma, uncomplicated: Secondary | ICD-10-CM | POA: Diagnosis not present

## 2020-07-06 DIAGNOSIS — E6609 Other obesity due to excess calories: Secondary | ICD-10-CM | POA: Diagnosis not present

## 2020-07-06 DIAGNOSIS — R072 Precordial pain: Secondary | ICD-10-CM | POA: Diagnosis not present

## 2020-07-06 DIAGNOSIS — R0602 Shortness of breath: Secondary | ICD-10-CM | POA: Diagnosis not present

## 2020-07-06 DIAGNOSIS — I10 Essential (primary) hypertension: Secondary | ICD-10-CM | POA: Diagnosis not present

## 2020-07-07 DIAGNOSIS — Z6837 Body mass index (BMI) 37.0-37.9, adult: Secondary | ICD-10-CM | POA: Diagnosis not present

## 2020-07-07 DIAGNOSIS — E6609 Other obesity due to excess calories: Secondary | ICD-10-CM | POA: Diagnosis not present

## 2020-07-07 DIAGNOSIS — R634 Abnormal weight loss: Secondary | ICD-10-CM | POA: Diagnosis not present

## 2020-07-07 DIAGNOSIS — M1990 Unspecified osteoarthritis, unspecified site: Secondary | ICD-10-CM | POA: Diagnosis not present

## 2020-07-07 DIAGNOSIS — G8929 Other chronic pain: Secondary | ICD-10-CM | POA: Diagnosis not present

## 2020-07-07 DIAGNOSIS — I110 Hypertensive heart disease with heart failure: Secondary | ICD-10-CM | POA: Diagnosis not present

## 2020-07-07 DIAGNOSIS — J4520 Mild intermittent asthma, uncomplicated: Secondary | ICD-10-CM | POA: Diagnosis not present

## 2020-07-07 DIAGNOSIS — I509 Heart failure, unspecified: Secondary | ICD-10-CM | POA: Diagnosis not present

## 2020-07-07 DIAGNOSIS — R32 Unspecified urinary incontinence: Secondary | ICD-10-CM | POA: Diagnosis not present

## 2020-07-09 DIAGNOSIS — R32 Unspecified urinary incontinence: Secondary | ICD-10-CM | POA: Diagnosis not present

## 2020-07-09 DIAGNOSIS — Z6837 Body mass index (BMI) 37.0-37.9, adult: Secondary | ICD-10-CM | POA: Diagnosis not present

## 2020-07-09 DIAGNOSIS — I509 Heart failure, unspecified: Secondary | ICD-10-CM | POA: Diagnosis not present

## 2020-07-09 DIAGNOSIS — R634 Abnormal weight loss: Secondary | ICD-10-CM | POA: Diagnosis not present

## 2020-07-09 DIAGNOSIS — I110 Hypertensive heart disease with heart failure: Secondary | ICD-10-CM | POA: Diagnosis not present

## 2020-07-09 DIAGNOSIS — J4520 Mild intermittent asthma, uncomplicated: Secondary | ICD-10-CM | POA: Diagnosis not present

## 2020-07-09 DIAGNOSIS — G8929 Other chronic pain: Secondary | ICD-10-CM | POA: Diagnosis not present

## 2020-07-09 DIAGNOSIS — E6609 Other obesity due to excess calories: Secondary | ICD-10-CM | POA: Diagnosis not present

## 2020-07-09 DIAGNOSIS — M1990 Unspecified osteoarthritis, unspecified site: Secondary | ICD-10-CM | POA: Diagnosis not present

## 2020-07-13 DIAGNOSIS — R32 Unspecified urinary incontinence: Secondary | ICD-10-CM | POA: Diagnosis not present

## 2020-07-13 DIAGNOSIS — I509 Heart failure, unspecified: Secondary | ICD-10-CM | POA: Diagnosis not present

## 2020-07-13 DIAGNOSIS — E6609 Other obesity due to excess calories: Secondary | ICD-10-CM | POA: Diagnosis not present

## 2020-07-13 DIAGNOSIS — Z6837 Body mass index (BMI) 37.0-37.9, adult: Secondary | ICD-10-CM | POA: Diagnosis not present

## 2020-07-13 DIAGNOSIS — M1990 Unspecified osteoarthritis, unspecified site: Secondary | ICD-10-CM | POA: Diagnosis not present

## 2020-07-13 DIAGNOSIS — R634 Abnormal weight loss: Secondary | ICD-10-CM | POA: Diagnosis not present

## 2020-07-13 DIAGNOSIS — J4520 Mild intermittent asthma, uncomplicated: Secondary | ICD-10-CM | POA: Diagnosis not present

## 2020-07-13 DIAGNOSIS — I110 Hypertensive heart disease with heart failure: Secondary | ICD-10-CM | POA: Diagnosis not present

## 2020-07-13 DIAGNOSIS — G8929 Other chronic pain: Secondary | ICD-10-CM | POA: Diagnosis not present

## 2020-07-14 DIAGNOSIS — I10 Essential (primary) hypertension: Secondary | ICD-10-CM | POA: Diagnosis not present

## 2020-07-14 DIAGNOSIS — R0602 Shortness of breath: Secondary | ICD-10-CM | POA: Diagnosis not present

## 2020-07-14 DIAGNOSIS — E6609 Other obesity due to excess calories: Secondary | ICD-10-CM | POA: Diagnosis not present

## 2020-07-14 DIAGNOSIS — R072 Precordial pain: Secondary | ICD-10-CM | POA: Diagnosis not present

## 2020-07-18 DIAGNOSIS — J4520 Mild intermittent asthma, uncomplicated: Secondary | ICD-10-CM | POA: Diagnosis not present

## 2020-07-18 DIAGNOSIS — I509 Heart failure, unspecified: Secondary | ICD-10-CM | POA: Diagnosis not present

## 2020-07-18 DIAGNOSIS — E6609 Other obesity due to excess calories: Secondary | ICD-10-CM | POA: Diagnosis not present

## 2020-07-18 DIAGNOSIS — M1990 Unspecified osteoarthritis, unspecified site: Secondary | ICD-10-CM | POA: Diagnosis not present

## 2020-07-18 DIAGNOSIS — R634 Abnormal weight loss: Secondary | ICD-10-CM | POA: Diagnosis not present

## 2020-07-18 DIAGNOSIS — I110 Hypertensive heart disease with heart failure: Secondary | ICD-10-CM | POA: Diagnosis not present

## 2020-07-18 DIAGNOSIS — R32 Unspecified urinary incontinence: Secondary | ICD-10-CM | POA: Diagnosis not present

## 2020-07-18 DIAGNOSIS — G8929 Other chronic pain: Secondary | ICD-10-CM | POA: Diagnosis not present

## 2020-07-18 DIAGNOSIS — Z6837 Body mass index (BMI) 37.0-37.9, adult: Secondary | ICD-10-CM | POA: Diagnosis not present

## 2020-07-19 ENCOUNTER — Ambulatory Visit (INDEPENDENT_AMBULATORY_CARE_PROVIDER_SITE_OTHER): Payer: Medicare HMO

## 2020-07-19 DIAGNOSIS — I495 Sick sinus syndrome: Secondary | ICD-10-CM

## 2020-07-20 DIAGNOSIS — R32 Unspecified urinary incontinence: Secondary | ICD-10-CM | POA: Diagnosis not present

## 2020-07-20 DIAGNOSIS — J4520 Mild intermittent asthma, uncomplicated: Secondary | ICD-10-CM | POA: Diagnosis not present

## 2020-07-20 DIAGNOSIS — Z6837 Body mass index (BMI) 37.0-37.9, adult: Secondary | ICD-10-CM | POA: Diagnosis not present

## 2020-07-20 DIAGNOSIS — R634 Abnormal weight loss: Secondary | ICD-10-CM | POA: Diagnosis not present

## 2020-07-20 DIAGNOSIS — G8929 Other chronic pain: Secondary | ICD-10-CM | POA: Diagnosis not present

## 2020-07-20 DIAGNOSIS — I110 Hypertensive heart disease with heart failure: Secondary | ICD-10-CM | POA: Diagnosis not present

## 2020-07-20 DIAGNOSIS — E6609 Other obesity due to excess calories: Secondary | ICD-10-CM | POA: Diagnosis not present

## 2020-07-20 DIAGNOSIS — M1990 Unspecified osteoarthritis, unspecified site: Secondary | ICD-10-CM | POA: Diagnosis not present

## 2020-07-20 DIAGNOSIS — I509 Heart failure, unspecified: Secondary | ICD-10-CM | POA: Diagnosis not present

## 2020-07-20 LAB — CUP PACEART REMOTE DEVICE CHECK
Battery Remaining Longevity: 112 mo
Battery Remaining Percentage: 95.5 %
Battery Voltage: 3.04 V
Brady Statistic AP VP Percent: 23 %
Brady Statistic AP VS Percent: 1 %
Brady Statistic AS VP Percent: 77 %
Brady Statistic AS VS Percent: 1 %
Brady Statistic RA Percent Paced: 22 %
Brady Statistic RV Percent Paced: 99 %
Date Time Interrogation Session: 20211213020022
Implantable Lead Implant Date: 20120702
Implantable Lead Implant Date: 20120702
Implantable Lead Location: 753859
Implantable Lead Location: 753860
Implantable Pulse Generator Implant Date: 20210913
Lead Channel Impedance Value: 360 Ohm
Lead Channel Impedance Value: 390 Ohm
Lead Channel Pacing Threshold Amplitude: 1 V
Lead Channel Pacing Threshold Amplitude: 1.25 V
Lead Channel Pacing Threshold Pulse Width: 0.4 ms
Lead Channel Pacing Threshold Pulse Width: 0.5 ms
Lead Channel Sensing Intrinsic Amplitude: 3.5 mV
Lead Channel Setting Pacing Amplitude: 1.5 V
Lead Channel Setting Pacing Amplitude: 2 V
Lead Channel Setting Pacing Pulse Width: 0.5 ms
Lead Channel Setting Sensing Sensitivity: 4 mV
Pulse Gen Model: 2272
Pulse Gen Serial Number: 3864080

## 2020-07-23 ENCOUNTER — Other Ambulatory Visit: Payer: Self-pay

## 2020-07-23 ENCOUNTER — Ambulatory Visit: Payer: Medicare HMO | Admitting: Internal Medicine

## 2020-07-23 ENCOUNTER — Encounter: Payer: Self-pay | Admitting: Internal Medicine

## 2020-07-23 VITALS — BP 122/66 | HR 83 | Ht 67.5 in | Wt 235.8 lb

## 2020-07-23 DIAGNOSIS — I5032 Chronic diastolic (congestive) heart failure: Secondary | ICD-10-CM

## 2020-07-23 DIAGNOSIS — Z950 Presence of cardiac pacemaker: Secondary | ICD-10-CM | POA: Diagnosis not present

## 2020-07-23 DIAGNOSIS — I442 Atrioventricular block, complete: Secondary | ICD-10-CM | POA: Diagnosis not present

## 2020-07-23 NOTE — Patient Instructions (Signed)
Medication Instructions:  Your physician recommends that you continue on your current medications as directed. Please refer to the Current Medication list given to you today.  Labwork: None ordered.  Testing/Procedures: None ordered.  Follow-Up: Your physician wants you to follow-up in: one year with Dr. Lovena Le.   You will receive a reminder letter in the mail two months in advance. If you don't receive a letter, please call our office to schedule the follow-up appointment.  Remote monitoring is used to monitor your Pacemaker from home. This monitoring reduces the number of office visits required to check your device to one time per year. It allows [place] to keep an eye on the functioning of your device to ensure it is working properly. You are scheduled for a device check from home on 10/18/2020. You may send your transmission at any time that day. If you have a wireless device, the transmission will be sent automatically. After your physician reviews your transmission, you will receive a postcard with your next transmission date.  Any Other Special Instructions Will Be Listed Below (If Applicable).  If you need a refill on your cardiac medications before your next appointment, please call your pharmacy.

## 2020-07-23 NOTE — Progress Notes (Signed)
HPI [name] returns today for followup. She is a pleasant 79 yo woman with a h/o HTN, CHB, s/p PPM insertion. She has morbid obesity. She has undergone PPM gen change out. She denies chest pain or sob. She has lost over 40 lbs. She admits to dietary indiscretion. Allergies  Allergen Reactions  . Albuterol Palpitations and Other (See Comments)    irregular heartbeat     Current Outpatient Medications  Medication Sig Dispense Refill  . acetaminophen (TYLENOL) 325 MG tablet Take 2 tablets (650 mg total) by mouth every 4 (four) hours as needed for headache or mild pain.    Marland Kitchen atorvastatin (LIPITOR) 20 MG tablet Take 20 mg by mouth daily at 6 PM.     . budesonide-formoterol (SYMBICORT) 80-4.5 MCG/ACT inhaler Inhale 2 puffs into the lungs 2 (two) times daily. 3 Inhaler 1  . furosemide (LASIX) 40 MG tablet Take 1 tablet (40 mg total) by mouth every Monday, Wednesday, and Friday. 30 tablet   . levalbuterol (XOPENEX HFA) 45 MCG/ACT inhaler Inhale 2 puffs into the lungs every 4 (four) hours as needed for wheezing. 3 Inhaler 1  . montelukast (SINGULAIR) 10 MG tablet TAKE 1 TABLET BY MOUTH AT BEDTIME EVERY NIGHT 30 tablet 2  . omeprazole (PRILOSEC) 20 MG capsule Take 1 capsule (20 mg total) by mouth daily. Take 30-60 min before first meal of the day 30 capsule 5  . spironolactone (ALDACTONE) 25 MG tablet Take 0.5 tablets (12.5 mg total) by mouth daily.    Marland Kitchen warfarin (COUMADIN) 5 MG tablet Take 0.5 tablets (2.5 mg total) by mouth daily at 4 PM.     No current facility-administered medications for this visit.     Past Medical History:  Diagnosis Date  . Allergic rhinitis   . Anemia    hx due to med  . Arthritis   . Asthma   . CHF (congestive heart failure) ([place])   . Dyspnea   . Emphysema of lung ([place])   . GERD (gastroesophageal reflux disease)   . Hyperlipidemia   . Hypertension   . Pacemaker   . PAT (paroxysmal atrial tachycardia) ([place]) 2008  . Pneumonia    hx  . TB of kidney  1960's   rxd 3 yrs meds, no surgery in kidneys    ROS:   All systems reviewed and negative except as noted in the HPI.   Past Surgical History:  Procedure Laterality Date  . ABDOMINAL HYSTERECTOMY    . BREAST BIOPSY  04/30/2012   Procedure: BREAST BIOPSY WITH NEEDLE LOCALIZATION;  Surgeon: Lisa Bookbinder, MD;  Location: [facility];  Service: General;  Laterality: Left;  Left breast wire localization biospy  . HEMORRHOID SURGERY    . PACEMAKER INSERTION  02-06-2011  . PPM GENERATOR CHANGEOUT N/A 04/19/2020   Procedure: PPM GENERATOR CHANGEOUT;  Surgeon: Lisa Lance, MD;  Location: [facility];  Service: Cardiovascular;  Laterality: N/A;  . TAH and BSO    . TONSILLECTOMY       Family History  Problem Relation Age of Onset  . Heart disease Mother   . Cancer Sister        breast     Social History   Socioeconomic History  . Marital status: Divorced    Spouse name: Not on file  . Number of children: 2  . Years of education: Not on file  . Highest education level: Not on file  Occupational History  . Occupation: cna  Tobacco Use  . Smoking status: Former Smoker    Packs/day: 1.00    Years: 4.00    Pack years: 4.00    Types: Cigarettes    Quit date: 08/07/1977    Years since quitting: 42.9  . Smokeless tobacco: Never Used  Vaping Use  . Vaping Use: Never used  Substance and Sexual Activity  . Alcohol use: No    Alcohol/week: 0.0 standard drinks    Comment: quit 1979  . Drug use: No  . Sexual activity: Not on file  Other Topics Concern  . Not on file  Social History Narrative   Pt has 11 siblings in all   Social Determinants of Health   Financial Resource Strain: Not on file  Food Insecurity: Not on file  Transportation Needs: Not on file  Physical Activity: Not on file  Stress: Not on file  Social Connections: Not on file  Intimate Partner Violence: Not on file     BP 122/66   Pulse 83   Ht 5' 7.5" (1.715 m)   Wt 235 lb 12.8 oz (107 kg)    SpO2 96%   BMI 36.39 kg/m   Physical Exam:  Well appearing NAD HEENT: Unremarkable Neck:  No JVD, no thyromegally Lymphatics:  No adenopathy Back:  No CVA tenderness Lungs:  Clear with no wheezes HEART:  Regular rate rhythm, no murmurs, no rubs, no clicks Abd:  soft, positive bowel sounds, no organomegally, no rebound, no guarding Ext:  2 plus pulses, 3+ edema, no cyanosis, no clubbing Skin:  No rashes no nodules Neuro:  CN II through XII intact, motor grossly intact  EKG - nsr withbiv pacing  DEVICE  Normal device function.  See PaceArt for details.   Assess/Plan: 1. CHB - she is asymptomatic, s/p PPM insertion. 2. Chronic systolic heart failure - she has some peripheral edema and I have encouraged her to avoid salty foods. 3. Obesity - I encouraged her to lose 10 lbs in the next year. 4. HTN - her bp is well controlled. We will follow.  Lisa Overlie Denesha Brouse,MD

## 2020-07-25 DIAGNOSIS — J4520 Mild intermittent asthma, uncomplicated: Secondary | ICD-10-CM | POA: Diagnosis not present

## 2020-07-25 DIAGNOSIS — I110 Hypertensive heart disease with heart failure: Secondary | ICD-10-CM | POA: Diagnosis not present

## 2020-07-25 DIAGNOSIS — R634 Abnormal weight loss: Secondary | ICD-10-CM | POA: Diagnosis not present

## 2020-07-25 DIAGNOSIS — Z6837 Body mass index (BMI) 37.0-37.9, adult: Secondary | ICD-10-CM | POA: Diagnosis not present

## 2020-07-25 DIAGNOSIS — G8929 Other chronic pain: Secondary | ICD-10-CM | POA: Diagnosis not present

## 2020-07-25 DIAGNOSIS — E6609 Other obesity due to excess calories: Secondary | ICD-10-CM | POA: Diagnosis not present

## 2020-07-25 DIAGNOSIS — R32 Unspecified urinary incontinence: Secondary | ICD-10-CM | POA: Diagnosis not present

## 2020-07-25 DIAGNOSIS — M1990 Unspecified osteoarthritis, unspecified site: Secondary | ICD-10-CM | POA: Diagnosis not present

## 2020-07-25 DIAGNOSIS — I509 Heart failure, unspecified: Secondary | ICD-10-CM | POA: Diagnosis not present

## 2020-07-26 LAB — CUP PACEART INCLINIC DEVICE CHECK
Battery Remaining Longevity: 112 mo
Battery Voltage: 3.04 V
Brady Statistic RA Percent Paced: 22 %
Brady Statistic RV Percent Paced: 99.94 %
Date Time Interrogation Session: 20211217164745
Implantable Lead Implant Date: 20120702
Implantable Lead Implant Date: 20120702
Implantable Lead Location: 753859
Implantable Lead Location: 753860
Implantable Pulse Generator Implant Date: 20210913
Lead Channel Impedance Value: 375 Ohm
Lead Channel Impedance Value: 412.5 Ohm
Lead Channel Pacing Threshold Amplitude: 1 V
Lead Channel Pacing Threshold Amplitude: 1.25 V
Lead Channel Pacing Threshold Pulse Width: 0.4 ms
Lead Channel Pacing Threshold Pulse Width: 0.5 ms
Lead Channel Sensing Intrinsic Amplitude: 4.4 mV
Lead Channel Setting Pacing Amplitude: 1.5 V
Lead Channel Setting Pacing Amplitude: 2 V
Lead Channel Setting Pacing Pulse Width: 0.5 ms
Lead Channel Setting Sensing Sensitivity: 4 mV
Pulse Gen Model: 2272
Pulse Gen Serial Number: 3864080

## 2020-07-29 NOTE — Progress Notes (Signed)
Remote pacemaker transmission.   

## 2020-07-30 DIAGNOSIS — I509 Heart failure, unspecified: Secondary | ICD-10-CM | POA: Diagnosis not present

## 2020-08-04 DIAGNOSIS — R634 Abnormal weight loss: Secondary | ICD-10-CM | POA: Diagnosis not present

## 2020-08-04 DIAGNOSIS — Z6837 Body mass index (BMI) 37.0-37.9, adult: Secondary | ICD-10-CM | POA: Diagnosis not present

## 2020-08-04 DIAGNOSIS — I509 Heart failure, unspecified: Secondary | ICD-10-CM | POA: Diagnosis not present

## 2020-08-04 DIAGNOSIS — I110 Hypertensive heart disease with heart failure: Secondary | ICD-10-CM | POA: Diagnosis not present

## 2020-08-04 DIAGNOSIS — M1990 Unspecified osteoarthritis, unspecified site: Secondary | ICD-10-CM | POA: Diagnosis not present

## 2020-08-04 DIAGNOSIS — E6609 Other obesity due to excess calories: Secondary | ICD-10-CM | POA: Diagnosis not present

## 2020-08-04 DIAGNOSIS — J4520 Mild intermittent asthma, uncomplicated: Secondary | ICD-10-CM | POA: Diagnosis not present

## 2020-08-04 DIAGNOSIS — G8929 Other chronic pain: Secondary | ICD-10-CM | POA: Diagnosis not present

## 2020-08-04 DIAGNOSIS — R32 Unspecified urinary incontinence: Secondary | ICD-10-CM | POA: Diagnosis not present

## 2020-08-10 DIAGNOSIS — R32 Unspecified urinary incontinence: Secondary | ICD-10-CM | POA: Diagnosis not present

## 2020-08-10 DIAGNOSIS — I509 Heart failure, unspecified: Secondary | ICD-10-CM | POA: Diagnosis not present

## 2020-08-10 DIAGNOSIS — E6609 Other obesity due to excess calories: Secondary | ICD-10-CM | POA: Diagnosis not present

## 2020-08-10 DIAGNOSIS — J4520 Mild intermittent asthma, uncomplicated: Secondary | ICD-10-CM | POA: Diagnosis not present

## 2020-08-10 DIAGNOSIS — Z6837 Body mass index (BMI) 37.0-37.9, adult: Secondary | ICD-10-CM | POA: Diagnosis not present

## 2020-08-10 DIAGNOSIS — G8929 Other chronic pain: Secondary | ICD-10-CM | POA: Diagnosis not present

## 2020-08-10 DIAGNOSIS — M1990 Unspecified osteoarthritis, unspecified site: Secondary | ICD-10-CM | POA: Diagnosis not present

## 2020-08-10 DIAGNOSIS — I110 Hypertensive heart disease with heart failure: Secondary | ICD-10-CM | POA: Diagnosis not present

## 2020-08-10 DIAGNOSIS — R634 Abnormal weight loss: Secondary | ICD-10-CM | POA: Diagnosis not present

## 2020-08-16 DIAGNOSIS — E6609 Other obesity due to excess calories: Secondary | ICD-10-CM | POA: Diagnosis not present

## 2020-08-16 DIAGNOSIS — R0602 Shortness of breath: Secondary | ICD-10-CM | POA: Diagnosis not present

## 2020-08-16 DIAGNOSIS — R072 Precordial pain: Secondary | ICD-10-CM | POA: Diagnosis not present

## 2020-08-16 DIAGNOSIS — I10 Essential (primary) hypertension: Secondary | ICD-10-CM | POA: Diagnosis not present

## 2020-08-30 DIAGNOSIS — I509 Heart failure, unspecified: Secondary | ICD-10-CM | POA: Diagnosis not present

## 2020-09-15 DIAGNOSIS — I10 Essential (primary) hypertension: Secondary | ICD-10-CM | POA: Diagnosis not present

## 2020-09-15 DIAGNOSIS — R072 Precordial pain: Secondary | ICD-10-CM | POA: Diagnosis not present

## 2020-09-15 DIAGNOSIS — R0602 Shortness of breath: Secondary | ICD-10-CM | POA: Diagnosis not present

## 2020-09-15 DIAGNOSIS — E6609 Other obesity due to excess calories: Secondary | ICD-10-CM | POA: Diagnosis not present

## 2020-09-30 DIAGNOSIS — I509 Heart failure, unspecified: Secondary | ICD-10-CM | POA: Diagnosis not present

## 2020-10-18 ENCOUNTER — Ambulatory Visit (INDEPENDENT_AMBULATORY_CARE_PROVIDER_SITE_OTHER): Payer: Medicare HMO

## 2020-10-18 DIAGNOSIS — I495 Sick sinus syndrome: Secondary | ICD-10-CM

## 2020-10-18 LAB — CUP PACEART REMOTE DEVICE CHECK
Battery Remaining Longevity: 112 mo
Battery Remaining Percentage: 95.5 %
Battery Voltage: 3.01 V
Brady Statistic AP VP Percent: 20 %
Brady Statistic AP VS Percent: 1 %
Brady Statistic AS VP Percent: 80 %
Brady Statistic AS VS Percent: 1 %
Brady Statistic RA Percent Paced: 19 %
Brady Statistic RV Percent Paced: 99 %
Date Time Interrogation Session: 20220314030015
Implantable Lead Implant Date: 20120702
Implantable Lead Implant Date: 20120702
Implantable Lead Location: 753859
Implantable Lead Location: 753860
Implantable Pulse Generator Implant Date: 20210913
Lead Channel Impedance Value: 390 Ohm
Lead Channel Impedance Value: 410 Ohm
Lead Channel Pacing Threshold Amplitude: 0.75 V
Lead Channel Pacing Threshold Amplitude: 1.375 V
Lead Channel Pacing Threshold Pulse Width: 0.4 ms
Lead Channel Pacing Threshold Pulse Width: 0.5 ms
Lead Channel Sensing Intrinsic Amplitude: 3.5 mV
Lead Channel Setting Pacing Amplitude: 1.625
Lead Channel Setting Pacing Amplitude: 1.75 V
Lead Channel Setting Pacing Pulse Width: 0.5 ms
Lead Channel Setting Sensing Sensitivity: 4 mV
Pulse Gen Model: 2272
Pulse Gen Serial Number: 3864080

## 2020-10-25 NOTE — Progress Notes (Signed)
Remote pacemaker transmission.   

## 2020-10-28 DIAGNOSIS — I509 Heart failure, unspecified: Secondary | ICD-10-CM | POA: Diagnosis not present

## 2020-11-22 ENCOUNTER — Encounter (HOSPITAL_COMMUNITY): Payer: Self-pay

## 2020-11-22 ENCOUNTER — Emergency Department (HOSPITAL_COMMUNITY)
Admission: EM | Admit: 2020-11-22 | Discharge: 2020-11-23 | Disposition: A | Discharge status: Home / Self Care | Payer: Medicare HMO | Attending: Emergency Medicine | Admitting: Emergency Medicine

## 2020-11-22 ENCOUNTER — Other Ambulatory Visit: Payer: Self-pay

## 2020-11-22 ENCOUNTER — Emergency Department (HOSPITAL_COMMUNITY): Payer: Medicare HMO

## 2020-11-22 DIAGNOSIS — I110 Hypertensive heart disease with heart failure: Secondary | ICD-10-CM | POA: Insufficient documentation

## 2020-11-22 DIAGNOSIS — R197 Diarrhea, unspecified: Secondary | ICD-10-CM | POA: Diagnosis not present

## 2020-11-22 DIAGNOSIS — R109 Unspecified abdominal pain: Secondary | ICD-10-CM | POA: Diagnosis not present

## 2020-11-22 DIAGNOSIS — R1031 Right lower quadrant pain: Secondary | ICD-10-CM | POA: Insufficient documentation

## 2020-11-22 DIAGNOSIS — Z7951 Long term (current) use of inhaled steroids: Secondary | ICD-10-CM | POA: Insufficient documentation

## 2020-11-22 DIAGNOSIS — J4530 Mild persistent asthma, uncomplicated: Secondary | ICD-10-CM | POA: Diagnosis not present

## 2020-11-22 DIAGNOSIS — Z950 Presence of cardiac pacemaker: Secondary | ICD-10-CM | POA: Diagnosis not present

## 2020-11-22 DIAGNOSIS — Z7901 Long term (current) use of anticoagulants: Secondary | ICD-10-CM | POA: Insufficient documentation

## 2020-11-22 DIAGNOSIS — R1032 Left lower quadrant pain: Secondary | ICD-10-CM | POA: Insufficient documentation

## 2020-11-22 DIAGNOSIS — Z87891 Personal history of nicotine dependence: Secondary | ICD-10-CM | POA: Diagnosis not present

## 2020-11-22 DIAGNOSIS — K219 Gastro-esophageal reflux disease without esophagitis: Secondary | ICD-10-CM | POA: Insufficient documentation

## 2020-11-22 DIAGNOSIS — I5033 Acute on chronic diastolic (congestive) heart failure: Secondary | ICD-10-CM | POA: Diagnosis not present

## 2020-11-22 DIAGNOSIS — R1030 Lower abdominal pain, unspecified: Secondary | ICD-10-CM

## 2020-11-22 LAB — CBC WITH DIFFERENTIAL/PLATELET
Abs Immature Granulocytes: 0.02 10*3/uL (ref 0.00–0.07)
Basophils Absolute: 0 10*3/uL (ref 0.0–0.1)
Basophils Relative: 0 %
Eosinophils Absolute: 0.2 10*3/uL (ref 0.0–0.5)
Eosinophils Relative: 3 %
HCT: 36.1 % (ref 36.0–46.0)
Hemoglobin: 11.2 g/dL — ABNORMAL LOW (ref 12.0–15.0)
Immature Granulocytes: 0 %
Lymphocytes Relative: 34 %
Lymphs Abs: 2.4 10*3/uL (ref 0.7–4.0)
MCH: 28.1 pg (ref 26.0–34.0)
MCHC: 31 g/dL (ref 30.0–36.0)
MCV: 90.5 fL (ref 80.0–100.0)
Monocytes Absolute: 0.6 10*3/uL (ref 0.1–1.0)
Monocytes Relative: 8 %
Neutro Abs: 3.7 10*3/uL (ref 1.7–7.7)
Neutrophils Relative %: 55 %
Platelets: 245 10*3/uL (ref 150–400)
RBC: 3.99 MIL/uL (ref 3.87–5.11)
RDW: 15.5 % (ref 11.5–15.5)
WBC: 6.9 10*3/uL (ref 4.0–10.5)
nRBC: 0 % (ref 0.0–0.2)

## 2020-11-22 LAB — URINALYSIS, ROUTINE W REFLEX MICROSCOPIC
Bilirubin Urine: NEGATIVE
Glucose, UA: NEGATIVE mg/dL
Hgb urine dipstick: NEGATIVE
Ketones, ur: NEGATIVE mg/dL
Leukocytes,Ua: NEGATIVE
Nitrite: NEGATIVE
Protein, ur: NEGATIVE mg/dL
Specific Gravity, Urine: 1.014 (ref 1.005–1.030)
pH: 5 (ref 5.0–8.0)

## 2020-11-22 LAB — COMPREHENSIVE METABOLIC PANEL
ALT: 11 U/L (ref 0–44)
AST: 24 U/L (ref 15–41)
Albumin: 3.1 g/dL — ABNORMAL LOW (ref 3.5–5.0)
Alkaline Phosphatase: 100 U/L (ref 38–126)
Anion gap: 6 (ref 5–15)
BUN: 15 mg/dL (ref 8–23)
CO2: 24 mmol/L (ref 22–32)
Calcium: 8.5 mg/dL — ABNORMAL LOW (ref 8.9–10.3)
Chloride: 111 mmol/L (ref 98–111)
Creatinine, Ser: 0.83 mg/dL (ref 0.44–1.00)
GFR, Estimated: >60 mL/min (ref >60)
Glucose, Bld: 84 mg/dL (ref 70–99)
Potassium: 4.3 mmol/L (ref 3.5–5.1)
Sodium: 141 mmol/L (ref 135–145)
Total Bilirubin: 1 mg/dL (ref 0.3–1.2)
Total Protein: 6.4 g/dL — ABNORMAL LOW (ref 6.5–8.1)

## 2020-11-22 LAB — LIPASE, BLOOD: Lipase: 29 U/L (ref 11–51)

## 2020-11-22 MED ORDER — IOHEXOL 300 MG/ML  SOLN
100.0000 mL | Freq: Once | INTRAMUSCULAR | Status: AC | PRN
  Administered 2020-11-23: 100 mL via INTRAVENOUS

## 2020-11-22 MED ORDER — SODIUM CHLORIDE 0.9 % IV BOLUS
500.0000 mL | Freq: Once | INTRAVENOUS | Status: AC
  Administered 2020-11-22: 500 mL via INTRAVENOUS

## 2020-11-22 MED ORDER — SODIUM CHLORIDE 0.9 % IV SOLN: INTRAVENOUS | Status: DC

## 2020-11-22 NOTE — ED Triage Notes (Signed)
Pt reports lower abdominal pain for about 2 weeks. Denies dysuria. Regular bowel movements.

## 2020-11-22 NOTE — ED Provider Notes (Signed)
[facility] DEPT Provider Note   CSN: [id number] Arrival date & time: 11/22/20  2010     History Chief Complaint  Patient presents with  . Abdominal Pain    [name] is a 80 y.o. female.  HPI   Patient presents to the ED for evaluation of lower abdominal pain.  Patient states the symptoms started a couple weeks ago.  Pain has been in the bilateral lower abdomen.  She initially she was having some trouble with constipation but now has had some loose stools.  Maybe 3-4 episodes today.  She denies any recent antibiotics.  She does not have any fevers.  No vomiting.  Patient states she came to the ED today because she was tired of having the pain  Past Medical History:  Diagnosis Date  . Allergic rhinitis   . Anemia    hx due to med  . Arthritis   . Asthma   . CHF (congestive heart failure) ([place])   . Dyspnea   . Emphysema of lung ([place])   . GERD (gastroesophageal reflux disease)   . Hyperlipidemia   . Hypertension   . Pacemaker   . PAT (paroxysmal atrial tachycardia) ([place]) 2008  . Pneumonia    hx  . 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 Atrial fibrillation ([place]) 04/06/2020  . Dehydration 01/29/2020  . Shortness of breath 01/02/2020  . Congestive heart failure with left ventricular diastolic dysfunction, acute ([place]) 06/03/2019  . Acute on chronic diastolic CHF (congestive heart failure) ([place]) 06/03/2019  . GERD (gastroesophageal reflux disease) 06/11/2018  . Tachy-brady syndrome ([place]) 05/11/2015  . Severe obesity (BMI >= 40) ([place]) 05/03/2015  . Essential hypertension 02/03/2015  . Dyspnea 02/02/2015  . Chronic diastolic heart failure ([place]) 01/04/2015  . 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 Complete heart block ([place]) 05/09/2011  . Obesity, Class III, BMI 40-49.9 (morbid obesity) ([place]) 05/09/2011  . 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 PAROXYSMAL ATRIAL  TACHYCARDIA 06/01/2007  . Allergic rhinitis 06/01/2007  . Mild persistent asthma 06/01/2007    Past Surgical History:  Procedure Laterality Date  . ABDOMINAL HYSTERECTOMY    . BREAST BIOPSY  04/30/2012   Procedure: BREAST BIOPSY WITH NEEDLE LOCALIZATION;  Surgeon: Rolm Bookbinder, MD;  Location: [facility];  Service: General;  Laterality: Left;  Left breast wire localization biospy  . HEMORRHOID SURGERY    . PACEMAKER INSERTION  02-06-2011  . PPM GENERATOR CHANGEOUT N/A 04/19/2020   Procedure: PPM GENERATOR CHANGEOUT;  Surgeon: Evans Lance, MD;  Location: [facility];  Service: Cardiovascular;  Laterality: N/A;  . TAH and BSO    . TONSILLECTOMY       OB History   No obstetric history on file.     Family History  Problem Relation Age of Onset  . Heart disease Mother   . Cancer Sister        breast    Social History   Tobacco Use  . Smoking status: Former Smoker    Packs/day: 1.00    Years: 4.00    Pack years: 4.00    Types: Cigarettes    Quit date: 08/07/1977    Years since quitting: 43.3  . Smokeless tobacco: Never Used  Vaping Use  . Vaping Use: Never used  Substance Use Topics  . Alcohol use: No    Alcohol/week: 0.0 standard drinks  Comment: quit 1979  . Drug use: No    Home Medications Prior to Admission medications   Medication Sig Start Date End Date Taking? Authorizing Provider  acetaminophen (TYLENOL) 325 MG tablet Take 2 tablets (650 mg total) by mouth every 4 (four) hours as needed for headache or mild pain. 06/05/19   Dixie Dials, MD  atorvastatin (LIPITOR) 20 MG tablet Take 20 mg by mouth daily at 6 PM.  04/15/19   [provider]  budesonide-formoterol (SYMBICORT) 80-4.5 MCG/ACT inhaler Inhale 2 puffs into the lungs 2 (two) times daily. 05/24/18   Lauraine Rinne, NP  furosemide (LASIX) 40 MG tablet Take 1 tablet (40 mg total) by mouth every Monday, Wednesday, and Friday. 01/30/20   Dixie Dials, MD  levalbuterol [facility] HFA) 45 MCG/ACT  inhaler Inhale 2 puffs into the lungs every 4 (four) hours as needed for wheezing. 05/31/18   Lauraine Rinne, NP  montelukast (SINGULAIR) 10 MG tablet TAKE 1 TABLET BY MOUTH AT BEDTIME EVERY NIGHT 05/21/18   Lauraine Rinne, NP  omeprazole (PRILOSEC) 20 MG capsule Take 1 capsule (20 mg total) by mouth daily. Take 30-60 min before first meal of the day 05/14/18   Lauraine Rinne, NP  spironolactone (ALDACTONE) 25 MG tablet Take 0.5 tablets (12.5 mg total) by mouth daily. 01/30/20   Dixie Dials, MD  warfarin (COUMADIN) 5 MG tablet Take 0.5 tablets (2.5 mg total) by mouth daily at 4 PM. 01/31/20   Dixie Dials, MD    Allergies    Albuterol  Review of Systems   Review of Systems  All other systems reviewed and are negative.   Physical Exam Updated Vital Signs BP 136/68   Pulse (!) 59   Temp 98.3 F (36.8 C) (Oral)   Resp 19   Ht 1.715 m (5' 7.5")   Wt 106.6 kg   SpO2 100%   BMI 36.26 kg/m   Physical Exam Vitals and nursing note reviewed.  Constitutional:      General: She is not in acute distress.    Appearance: She is well-developed.  HENT:     Head: Normocephalic and atraumatic.     Right Ear: External ear normal.     Left Ear: External ear normal.  Eyes:     General: No scleral icterus.       Right eye: No discharge.        Left eye: No discharge.     Conjunctiva/sclera: Conjunctivae normal.  Neck:     Trachea: No tracheal deviation.  Cardiovascular:     Rate and Rhythm: Normal rate and regular rhythm.  Pulmonary:     Effort: Pulmonary effort is normal. No respiratory distress.     Breath sounds: Normal breath sounds. No stridor. No wheezing or rales.  Abdominal:     General: Bowel sounds are normal. There is no distension.     Palpations: Abdomen is soft.     Tenderness: There is abdominal tenderness in the suprapubic area and left lower quadrant. There is no guarding or rebound.     Hernia: No hernia is present.     Comments: Mild tenderness palpation lower abdomen   Musculoskeletal:        General: No tenderness.     Cervical back: Neck supple.  Skin:    General: Skin is warm and dry.     Findings: No rash.  Neurological:     Mental Status: She is alert.     Cranial Nerves: No cranial  nerve deficit (no facial droop, extraocular movements intact, no slurred speech).     Sensory: No sensory deficit.     Motor: No abnormal muscle tone or seizure activity.     Coordination: Coordination normal.     ED Results / Procedures / Treatments   Labs (all labs ordered are listed, but only abnormal results are displayed) Labs Reviewed  COMPREHENSIVE METABOLIC PANEL - Abnormal; Notable for the following components:      Result Value   Calcium 8.5 (*)    Total Protein 6.4 (*)    Albumin 3.1 (*)    All other components within normal limits  CBC WITH DIFFERENTIAL/PLATELET - Abnormal; Notable for the following components:   Hemoglobin 11.2 (*)    All other components within normal limits  LIPASE, BLOOD  URINALYSIS, ROUTINE W REFLEX MICROSCOPIC    EKG None  Radiology No results found.  Procedures Procedures   Medications Ordered in ED Medications  sodium chloride 0.9 % bolus 500 mL (0 mLs Intravenous Stopped 11/22/20 2259)    And  0.9 %  sodium chloride infusion ( Intravenous New Bag/Given 11/22/20 2258)    ED Course  I have reviewed the triage vital signs and the nursing notes.  Pertinent labs & imaging results that were available during my care of the patient were reviewed by me and considered in my medical decision making (see chart for details).    MDM Rules/Calculators/A&P                          Patient presented to ED for evaluation of lower abdominal pain.  Patient has had a change in her bowel habits.  Symptoms concerning for the possibility of diverticulitis.  Initial labs are reassuring but with her age and persistent symptoms we will proceed with CT scan to evaluate further.   Care turned over at change of shift. Final Clinical  Impression(s) / ED Diagnoses Final diagnoses:  Abdominal pain, unspecified abdominal location      Dorie Rank, MD 11/22/20 2332

## 2020-11-22 NOTE — ED Notes (Signed)
Purewick applied to pt °

## 2020-11-22 NOTE — ED Notes (Signed)
Pt ambulatory to bathroom with 1 assist.

## 2020-11-23 ENCOUNTER — Emergency Department (HOSPITAL_COMMUNITY): Payer: Medicare HMO

## 2020-11-23 DIAGNOSIS — R109 Unspecified abdominal pain: Secondary | ICD-10-CM | POA: Diagnosis not present

## 2020-11-23 NOTE — Discharge Instructions (Addendum)
You were seen today for abdominal pain.  Your CT scan and lab work-up is reassuring.  Take Tylenol as needed for discomfort.  Follow-up with your primary physician if symptoms continue.  If you develop fevers, nausea, vomiting, any new or worsening symptoms you should be reevaluated.

## 2020-11-23 NOTE — ED Notes (Signed)
Pt to CT via stretcher

## 2020-11-23 NOTE — ED Notes (Signed)
Pt given sandwich and juice for PO challenge. Pt denies nausea or vomiting and any issue with eating prior to visit

## 2020-11-28 DIAGNOSIS — I509 Heart failure, unspecified: Secondary | ICD-10-CM | POA: Diagnosis not present

## 2020-11-29 DIAGNOSIS — R0602 Shortness of breath: Secondary | ICD-10-CM | POA: Diagnosis not present

## 2020-11-29 DIAGNOSIS — I10 Essential (primary) hypertension: Secondary | ICD-10-CM | POA: Diagnosis not present

## 2020-11-29 DIAGNOSIS — J4520 Mild intermittent asthma, uncomplicated: Secondary | ICD-10-CM | POA: Diagnosis not present

## 2020-11-29 DIAGNOSIS — R072 Precordial pain: Secondary | ICD-10-CM | POA: Diagnosis not present

## 2020-11-29 DIAGNOSIS — E6609 Other obesity due to excess calories: Secondary | ICD-10-CM | POA: Diagnosis not present

## 2020-12-16 ENCOUNTER — Other Ambulatory Visit: Payer: Self-pay

## 2020-12-16 NOTE — Patient Outreach (Signed)
[facility]) Care Management  12/16/2020  [name] 1940/11/28 [id number]   Telephone Screen  Referral Date: 12/15/2020 Referral Source: High Risk Report Insurance: [facility]   Outreach attempt # 1 to patient.  No answer after multiple rings and unable to leave message.       Plan: RN CM will make outreach attempt to patient within 3-4 business days. RN CM will send unsuccessful outreach letter to patient.   [name], RN,BSN,CCM [facility] Telephonic Care Management Coordinator Direct Phone: [phone] Toll Free: [phone] Fax: [phone]

## 2020-12-17 ENCOUNTER — Other Ambulatory Visit: Payer: Self-pay

## 2020-12-17 NOTE — Patient Outreach (Addendum)
[facility]) Care Management  12/17/2020  [name] 05-28-1941 [id number]   Telephone Screen  Referral Date: 12/15/2020 Referral Source: High Risk Report Insurance: [facility] Initial Assessment   Successful outreach call placed to patient. [facility] services reviewed and discussed with patient. Patient agreeable to services.  Social: She lives in her home alone. She has supportive son and brother that live nearby and checks on her. She is independent with ADLs/IADLs except she no longer drives. Family takes her to appts. She gets MOWs delivered to home. No recent falls-uses cane primarily to aide in ambulation.   Conditions: Per chart review, patient has PMH that includes but not limited to arthritis, anemia, asthma, CHF, A-fib, GERD, HTN, HLD, complete heart block, and pacer. Patient reports she is seeing Dr. Doylene Canard as heart MD. She is ware that she needs to find PCP.   Medications Reviewed Today    Reviewed by Hayden Pedro, RN (Registered Nurse) on 12/17/20 at [facility] List Status: <None>  Medication Order Taking? Sig Documenting Provider Last Dose Status Informant  acetaminophen (TYLENOL) 325 MG tablet [id number] No Take 2 tablets (650 mg total) by mouth every 4 (four) hours as needed for headache or mild pain. Dixie Dials, MD Taking Active Self  atorvastatin (LIPITOR) 20 MG tablet [id number] No Take 20 mg by mouth daily at 6 PM.  [provider] Taking Active Self  budesonide-formoterol (SYMBICORT) 80-4.5 MCG/ACT inhaler [id number] No Inhale 2 puffs into the lungs 2 (two) times daily. Lauraine Rinne, NP Taking Active Self  furosemide (LASIX) 40 MG tablet [id number] No Take 1 tablet (40 mg total) by mouth every Monday, Wednesday, and Friday. Dixie Dials, MD Taking Active Self  levalbuterol [facility] HFA) 45 MCG/ACT inhaler [id number] No Inhale 2 puffs into the lungs every 4 (four) hours as needed for wheezing. Lauraine Rinne, NP Taking Active  Self  montelukast (SINGULAIR) 10 MG tablet [id number] No TAKE 1 TABLET BY MOUTH AT BEDTIME EVERY NIGHT Lauraine Rinne, NP Taking Active Self  omeprazole (PRILOSEC) 20 MG capsule [id number] No Take 1 capsule (20 mg total) by mouth daily. Take 30-60 min before first meal of the day Lauraine Rinne, NP Taking Active Self  spironolactone (ALDACTONE) 25 MG tablet [id number] No Take 0.5 tablets (12.5 mg total) by mouth daily. Dixie Dials, MD Taking Active Self  warfarin (COUMADIN) 5 MG tablet [id number] No Take 0.5 tablets (2.5 mg total) by mouth daily at 4 PM. Dixie Dials, MD Taking Active Self         Fall Risk  12/17/2020 05/27/2018 05/22/2018  Falls in the past year? 0 Yes Yes  Number falls in past yr: 0 1 1  Injury with Fall? 0 No No  Risk for fall due to : Impaired mobility;Medication side effect Impaired balance/gait;Impaired mobility Impaired balance/gait  Follow up Education provided;Falls evaluation completed Education provided;Falls prevention discussed Falls prevention discussed   Depression screen [facility] 2/9 12/17/2020 05/27/2018 05/21/2018  Decreased Interest 0 0 1  Down, Depressed, Hopeless 0 1 2  PHQ - 2 Score 0 1 3  Altered sleeping - 0 2  Tired, decreased energy - 1 3  Change in appetite - 0 3  Feeling bad or failure about yourself  - 0 2  Trouble concentrating - 1 1  Moving slowly or fidgety/restless - 0 1  Suicidal thoughts - 0 0  PHQ-9 Score - 3 15  Difficult doing work/chores - Not difficult at all Not difficult at  all  Some recent data might be hidden   SDOH Screenings   Alcohol Screen: Not on file  Depression (PHQ2-9): Low Risk   . PHQ-2 Score: 0  Financial Resource Strain: Not on file  Food Insecurity: No Food Insecurity  . Worried About Charity fundraiser in the Last Year: Never true  . Ran Out of Food in the Last Year: Never true  Housing: Not on file  Physical Activity: Not on file  Social Connections: Not on file  Stress: Not on file  Tobacco Use: Medium  Risk  . Smoking Tobacco Use: Former Smoker  . Smokeless Tobacco Use: Never Used  Transportation Needs: Unknown  . Lack of Transportation (Medical): No  . Lack of Transportation (Non-Medical): Not on file   Goals Addressed              This Visit's Progress   .  (THN)Make and Keep All Appointments (pt-stated)        Timeframe:  Long-Range Goal Priority:  High Start Date:  12/17/2020                           Expected End Date:  04/05/2021                     Follow Up Date June 2022   - ask family or friend for a ride - keep a calendar with prescription refill dates - keep a calendar with appointment dates  -review lit of PCPs and contact offices   Why is this important?    Part of staying healthy is seeing the doctor for follow-up care.   If you forget your appointments, there are some things you can do to stay on track.    Notes:  12/17/2020- Patient reports she has been using cardiologist as PCP but know she needs to find a PCP. RN CM mailed patient a list of possible options based on patient's area that she prefers.         Plan: RN CM discussed with patient next outreach within the month of June. Patient gave verbal consent and in agreement with RN CM follow up and timeframe. Patient aware that they may contact RN CM sooner for any issues or concerns. RN CM reviewed goals and plan of care with patient. Patient agrees to care plan and follow up. RN CM will send barriers letter and route encounter to PCP. RN CM will send welcome letter to patient.   [name], RN,BSN,CCM [facility] Telephonic Care Management Coordinator Direct Phone: [phone] Toll Free: [phone] Fax: [phone]

## 2020-12-28 DIAGNOSIS — I509 Heart failure, unspecified: Secondary | ICD-10-CM | POA: Diagnosis not present

## 2020-12-31 ENCOUNTER — Other Ambulatory Visit: Payer: Self-pay

## 2020-12-31 DIAGNOSIS — I5032 Chronic diastolic (congestive) heart failure: Secondary | ICD-10-CM

## 2020-12-31 NOTE — Progress Notes (Signed)
Referral placed to Dr. Oval Linsey per Dr. Lovena Le to establish for general cardiology.  Previous seen by Dr. Doylene Canard.

## 2021-01-08 ENCOUNTER — Emergency Department (HOSPITAL_COMMUNITY): Payer: Medicare HMO

## 2021-01-08 ENCOUNTER — Other Ambulatory Visit: Payer: Self-pay

## 2021-01-08 ENCOUNTER — Inpatient Hospital Stay (HOSPITAL_COMMUNITY)
Admission: EM | Admit: 2021-01-08 | Discharge: 2021-01-12 | DRG: 603 | Disposition: A | Discharge status: Home / Self Care | Payer: Medicare HMO | Attending: Cardiology | Admitting: Cardiology

## 2021-01-08 ENCOUNTER — Encounter (HOSPITAL_COMMUNITY): Payer: Self-pay

## 2021-01-08 DIAGNOSIS — Z7901 Long term (current) use of anticoagulants: Secondary | ICD-10-CM

## 2021-01-08 DIAGNOSIS — L03115 Cellulitis of right lower limb: Secondary | ICD-10-CM | POA: Diagnosis not present

## 2021-01-08 DIAGNOSIS — M7989 Other specified soft tissue disorders: Secondary | ICD-10-CM | POA: Diagnosis not present

## 2021-01-08 DIAGNOSIS — R0602 Shortness of breath: Secondary | ICD-10-CM | POA: Diagnosis not present

## 2021-01-08 DIAGNOSIS — R791 Abnormal coagulation profile: Secondary | ICD-10-CM | POA: Diagnosis present

## 2021-01-08 DIAGNOSIS — Z20822 Contact with and (suspected) exposure to covid-19: Secondary | ICD-10-CM | POA: Diagnosis not present

## 2021-01-08 DIAGNOSIS — I130 Hypertensive heart and chronic kidney disease with heart failure and stage 1 through stage 4 chronic kidney disease, or unspecified chronic kidney disease: Secondary | ICD-10-CM | POA: Diagnosis present

## 2021-01-08 DIAGNOSIS — Z87891 Personal history of nicotine dependence: Secondary | ICD-10-CM

## 2021-01-08 DIAGNOSIS — I442 Atrioventricular block, complete: Secondary | ICD-10-CM | POA: Diagnosis not present

## 2021-01-08 DIAGNOSIS — I480 Paroxysmal atrial fibrillation: Secondary | ICD-10-CM | POA: Diagnosis present

## 2021-01-08 DIAGNOSIS — R079 Chest pain, unspecified: Secondary | ICD-10-CM | POA: Diagnosis not present

## 2021-01-08 DIAGNOSIS — I10 Essential (primary) hypertension: Secondary | ICD-10-CM | POA: Diagnosis not present

## 2021-01-08 DIAGNOSIS — Z888 Allergy status to other drugs, medicaments and biological substances status: Secondary | ICD-10-CM | POA: Diagnosis not present

## 2021-01-08 DIAGNOSIS — E785 Hyperlipidemia, unspecified: Secondary | ICD-10-CM | POA: Diagnosis present

## 2021-01-08 DIAGNOSIS — Z8249 Family history of ischemic heart disease and other diseases of the circulatory system: Secondary | ICD-10-CM | POA: Diagnosis not present

## 2021-01-08 DIAGNOSIS — M1990 Unspecified osteoarthritis, unspecified site: Secondary | ICD-10-CM | POA: Diagnosis present

## 2021-01-08 DIAGNOSIS — R2243 Localized swelling, mass and lump, lower limb, bilateral: Secondary | ICD-10-CM | POA: Diagnosis not present

## 2021-01-08 DIAGNOSIS — R0600 Dyspnea, unspecified: Secondary | ICD-10-CM

## 2021-01-08 DIAGNOSIS — Z6836 Body mass index (BMI) 36.0-36.9, adult: Secondary | ICD-10-CM | POA: Diagnosis not present

## 2021-01-08 DIAGNOSIS — R600 Localized edema: Secondary | ICD-10-CM

## 2021-01-08 DIAGNOSIS — Z79899 Other long term (current) drug therapy: Secondary | ICD-10-CM

## 2021-01-08 DIAGNOSIS — Z950 Presence of cardiac pacemaker: Secondary | ICD-10-CM | POA: Diagnosis not present

## 2021-01-08 DIAGNOSIS — J439 Emphysema, unspecified: Secondary | ICD-10-CM | POA: Diagnosis present

## 2021-01-08 DIAGNOSIS — I5032 Chronic diastolic (congestive) heart failure: Secondary | ICD-10-CM | POA: Diagnosis not present

## 2021-01-08 DIAGNOSIS — K219 Gastro-esophageal reflux disease without esophagitis: Secondary | ICD-10-CM | POA: Diagnosis present

## 2021-01-08 DIAGNOSIS — R059 Cough, unspecified: Secondary | ICD-10-CM | POA: Diagnosis not present

## 2021-01-08 DIAGNOSIS — N179 Acute kidney failure, unspecified: Secondary | ICD-10-CM | POA: Diagnosis present

## 2021-01-08 DIAGNOSIS — Z7951 Long term (current) use of inhaled steroids: Secondary | ICD-10-CM

## 2021-01-08 DIAGNOSIS — D631 Anemia in chronic kidney disease: Secondary | ICD-10-CM | POA: Diagnosis present

## 2021-01-08 DIAGNOSIS — I959 Hypotension, unspecified: Secondary | ICD-10-CM | POA: Diagnosis not present

## 2021-01-08 DIAGNOSIS — R609 Edema, unspecified: Secondary | ICD-10-CM | POA: Diagnosis not present

## 2021-01-08 DIAGNOSIS — I890 Lymphedema, not elsewhere classified: Secondary | ICD-10-CM | POA: Diagnosis present

## 2021-01-08 DIAGNOSIS — N1832 Chronic kidney disease, stage 3b: Secondary | ICD-10-CM | POA: Diagnosis present

## 2021-01-08 DIAGNOSIS — R0609 Other forms of dyspnea: Secondary | ICD-10-CM

## 2021-01-08 LAB — CBC
HCT: 36.2 % (ref 36.0–46.0)
Hemoglobin: 11.2 g/dL — ABNORMAL LOW (ref 12.0–15.0)
MCH: 28.6 pg (ref 26.0–34.0)
MCHC: 30.9 g/dL (ref 30.0–36.0)
MCV: 92.3 fL (ref 80.0–100.0)
Platelets: 248 10*3/uL (ref 150–400)
RBC: 3.92 MIL/uL (ref 3.87–5.11)
RDW: 16.4 % — ABNORMAL HIGH (ref 11.5–15.5)
WBC: 6.1 10*3/uL (ref 4.0–10.5)
nRBC: 0 % (ref 0.0–0.2)

## 2021-01-08 LAB — BASIC METABOLIC PANEL
Anion gap: 10 (ref 5–15)
BUN: 15 mg/dL (ref 8–23)
CO2: 24 mmol/L (ref 22–32)
Calcium: 8.7 mg/dL — ABNORMAL LOW (ref 8.9–10.3)
Chloride: 107 mmol/L (ref 98–111)
Creatinine, Ser: 1.18 mg/dL — ABNORMAL HIGH (ref 0.44–1.00)
GFR, Estimated: 46 mL/min — ABNORMAL LOW (ref >60)
Glucose, Bld: 97 mg/dL (ref 70–99)
Potassium: 3.4 mmol/L — ABNORMAL LOW (ref 3.5–5.1)
Sodium: 141 mmol/L (ref 135–145)

## 2021-01-08 LAB — URINALYSIS, ROUTINE W REFLEX MICROSCOPIC
Bilirubin Urine: NEGATIVE
Glucose, UA: NEGATIVE mg/dL
Hgb urine dipstick: NEGATIVE
Ketones, ur: 5 mg/dL — AB
Leukocytes,Ua: NEGATIVE
Nitrite: NEGATIVE
Protein, ur: NEGATIVE mg/dL
Specific Gravity, Urine: 1.01 (ref 1.005–1.030)
pH: 6 (ref 5.0–8.0)

## 2021-01-08 LAB — HEPATIC FUNCTION PANEL
ALT: 13 U/L (ref 0–44)
AST: 25 U/L (ref 15–41)
Albumin: 3 g/dL — ABNORMAL LOW (ref 3.5–5.0)
Alkaline Phosphatase: 109 U/L (ref 38–126)
Bilirubin, Direct: 0.2 mg/dL (ref 0.0–0.2)
Indirect Bilirubin: 0.9 mg/dL (ref 0.3–0.9)
Total Bilirubin: 1.1 mg/dL (ref 0.3–1.2)
Total Protein: 6.3 g/dL — ABNORMAL LOW (ref 6.5–8.1)

## 2021-01-08 LAB — MRSA PCR SCREENING: MRSA by PCR: NEGATIVE

## 2021-01-08 LAB — TROPONIN I (HIGH SENSITIVITY)
Troponin I (High Sensitivity): 10 ng/L (ref <18)
Troponin I (High Sensitivity): 10 ng/L (ref <18)

## 2021-01-08 LAB — BRAIN NATRIURETIC PEPTIDE: B Natriuretic Peptide: 26.7 pg/mL (ref 0.0–100.0)

## 2021-01-08 LAB — PROTIME-INR
INR: 1.2 (ref 0.8–1.2)
Prothrombin Time: 15.7 seconds — ABNORMAL HIGH (ref 11.4–15.2)

## 2021-01-08 LAB — RESP PANEL BY RT-PCR (FLU A&B, COVID) ARPGX2
Influenza A by PCR: NEGATIVE
Influenza B by PCR: NEGATIVE
SARS Coronavirus 2 by RT PCR: NEGATIVE

## 2021-01-08 LAB — MAGNESIUM: Magnesium: 2 mg/dL (ref 1.7–2.4)

## 2021-01-08 MED ORDER — POTASSIUM CHLORIDE 20 MEQ PO PACK
40.0000 meq | PACK | Freq: Once | ORAL | Status: AC
  Administered 2021-01-08: 40 meq via ORAL
  Filled 2021-01-08: qty 2

## 2021-01-08 MED ORDER — NITROGLYCERIN 0.4 MG SL SUBL
0.4000 mg | SUBLINGUAL_TABLET | SUBLINGUAL | Status: DC | PRN
Start: 2021-01-08 — End: 2021-01-12

## 2021-01-08 MED ORDER — METOPROLOL TARTRATE 12.5 MG HALF TABLET
12.5000 mg | ORAL_TABLET | Freq: Two times a day (BID) | ORAL | Status: DC
  Administered 2021-01-08 – 2021-01-11 (×5): 12.5 mg via ORAL
  Filled 2021-01-08 (×8): qty 1

## 2021-01-08 MED ORDER — HEPARIN (PORCINE) 25000 UT/250ML-% IV SOLN
900.0000 [IU]/h | INTRAVENOUS | Status: DC
  Administered 2021-01-08: 1200 [IU]/h via INTRAVENOUS
  Administered 2021-01-09: 900 [IU]/h via INTRAVENOUS
  Filled 2021-01-08 (×4): qty 250

## 2021-01-08 MED ORDER — CEFAZOLIN SODIUM-DEXTROSE 2-4 GM/100ML-% IV SOLN
2.0000 g | Freq: Three times a day (TID) | INTRAVENOUS | Status: DC
  Administered 2021-01-08 – 2021-01-12 (×11): 2 g via INTRAVENOUS
  Filled 2021-01-08 (×12): qty 100

## 2021-01-08 MED ORDER — ASPIRIN 81 MG PO CHEW
324.0000 mg | CHEWABLE_TABLET | ORAL | Status: AC
  Administered 2021-01-08: 324 mg via ORAL
  Filled 2021-01-08: qty 4

## 2021-01-08 MED ORDER — ONDANSETRON HCL 4 MG/2ML IJ SOLN: 4.0000 mg | Freq: Four times a day (QID) | INTRAMUSCULAR | Status: DC | PRN

## 2021-01-08 MED ORDER — ASPIRIN EC 81 MG PO TBEC
81.0000 mg | DELAYED_RELEASE_TABLET | Freq: Every day | ORAL | Status: DC
  Administered 2021-01-09 – 2021-01-12 (×4): 81 mg via ORAL
  Filled 2021-01-08 (×4): qty 1

## 2021-01-08 MED ORDER — PANTOPRAZOLE SODIUM 40 MG PO TBEC
40.0000 mg | DELAYED_RELEASE_TABLET | Freq: Every day | ORAL | Status: DC
  Administered 2021-01-08 – 2021-01-12 (×5): 40 mg via ORAL
  Filled 2021-01-08 (×5): qty 1

## 2021-01-08 MED ORDER — HEPARIN BOLUS VIA INFUSION
4000.0000 [IU] | Freq: Once | INTRAVENOUS | Status: AC
  Administered 2021-01-08: 4000 [IU] via INTRAVENOUS
  Filled 2021-01-08: qty 4000

## 2021-01-08 MED ORDER — LEVALBUTEROL HCL 0.63 MG/3ML IN NEBU: 0.6300 mg | INHALATION_SOLUTION | RESPIRATORY_TRACT | Status: DC | PRN

## 2021-01-08 MED ORDER — ASPIRIN 300 MG RE SUPP
300.0000 mg | RECTAL | Status: AC
  Filled 2021-01-08: qty 1

## 2021-01-08 MED ORDER — FUROSEMIDE 10 MG/ML IJ SOLN
40.0000 mg | Freq: Once | INTRAMUSCULAR | Status: AC
  Administered 2021-01-08: 40 mg via INTRAVENOUS
  Filled 2021-01-08: qty 4

## 2021-01-08 MED ORDER — MONTELUKAST SODIUM 10 MG PO TABS
10.0000 mg | ORAL_TABLET | Freq: Every day | ORAL | Status: DC
  Administered 2021-01-08 – 2021-01-11 (×4): 10 mg via ORAL
  Filled 2021-01-08 (×4): qty 1

## 2021-01-08 MED ORDER — WARFARIN SODIUM 7.5 MG PO TABS
7.5000 mg | ORAL_TABLET | Freq: Once | ORAL | Status: AC
  Administered 2021-01-08: 7.5 mg via ORAL
  Filled 2021-01-08 (×2): qty 1

## 2021-01-08 MED ORDER — SODIUM CHLORIDE 0.9 % IV SOLN: INTRAVENOUS | Status: DC

## 2021-01-08 MED ORDER — ATORVASTATIN CALCIUM 10 MG PO TABS
20.0000 mg | ORAL_TABLET | Freq: Every day | ORAL | Status: DC
  Administered 2021-01-09 – 2021-01-11 (×3): 20 mg via ORAL
  Filled 2021-01-08 (×3): qty 2

## 2021-01-08 MED ORDER — ACETAMINOPHEN 325 MG PO TABS: 650.0000 mg | ORAL_TABLET | ORAL | Status: DC | PRN

## 2021-01-08 MED ORDER — WARFARIN - PHARMACIST DOSING INPATIENT: Freq: Every day | Status: DC

## 2021-01-08 MED ORDER — FLUTICASONE FUROATE-VILANTEROL 100-25 MCG/INH IN AEPB
1.0000 | INHALATION_SPRAY | Freq: Every day | RESPIRATORY_TRACT | Status: DC
  Administered 2021-01-09 – 2021-01-12 (×4): 1 via RESPIRATORY_TRACT
  Filled 2021-01-08: qty 28

## 2021-01-08 MED ORDER — TORSEMIDE 20 MG PO TABS
10.0000 mg | ORAL_TABLET | Freq: Every day | ORAL | Status: DC
Start: 2021-01-08 — End: 2021-01-12
  Administered 2021-01-08 – 2021-01-12 (×5): 10 mg via ORAL
  Filled 2021-01-08 (×5): qty 1

## 2021-01-08 MED ORDER — POTASSIUM CHLORIDE CRYS ER 20 MEQ PO TBCR
20.0000 meq | EXTENDED_RELEASE_TABLET | Freq: Every day | ORAL | Status: DC
  Administered 2021-01-08 – 2021-01-12 (×5): 20 meq via ORAL
  Filled 2021-01-08 (×3): qty 1
  Filled 2021-01-08 (×2): qty 2
  Filled 2021-01-08: qty 1

## 2021-01-08 NOTE — H&P (Signed)
[name] is an 80 y.o. female.   Chief Complaint: Progressive increasing leg swelling associated with shortness of breath HPI: Patient is 80 year old female with past medical history significant for hypertension, hyperlipidemia, history of complete heart block status post permanent pacemaker, paroxysmal atrial fibrillation CHA2DS2-VASc score of 6 on chronic anticoagulation, emphysema, chronic kidney disease stage II, came to the ER complaining of progressive increasing shortness of breath associated with leg swelling for last 2 days despite increasing dose of p.o. Lasix.  Patient denies any chest pain nausea vomiting diaphoresis.  Denies PND orthopnea but complains of increased leg swelling.  Also noted to have mild redness of right leg.  Denies any fever or chills.  EKG done in the ED showed atrial sensed ventricular paced rhythm.  2 sets of high-sensitivity troponin I are negative her BNP is in normal range.  States her leg swelling persist despite increasing dose of Lasix.  Denies any noncompliance to diet or medication.  Past Medical History:  Diagnosis Date  . Allergic rhinitis   . Anemia    hx due to med  . Arthritis   . Asthma   . CHF (congestive heart failure) ([place])   . Dyspnea   . Emphysema of lung ([place])   . GERD (gastroesophageal reflux disease)   . Hyperlipidemia   . Hypertension   . Pacemaker   . PAT (paroxysmal atrial tachycardia) ([place]) 2008  . Pneumonia    hx  . TB of kidney 29's   rxd 3 yrs meds, no surgery in kidneys    Past Surgical History:  Procedure Laterality Date  . ABDOMINAL HYSTERECTOMY    . BREAST BIOPSY  04/30/2012   Procedure: BREAST BIOPSY WITH NEEDLE LOCALIZATION;  Surgeon: Rolm Bookbinder, MD;  Location: [facility];  Service: General;  Laterality: Left;  Left breast wire localization biospy  . HEMORRHOID SURGERY    . PACEMAKER INSERTION  02-06-2011  . PPM GENERATOR CHANGEOUT N/A 04/19/2020   Procedure: PPM GENERATOR CHANGEOUT;  Surgeon: Evans Lance,  MD;  Location: [facility];  Service: Cardiovascular;  Laterality: N/A;  . TAH and BSO    . TONSILLECTOMY      Family History  Problem Relation Age of Onset  . Heart disease Mother   . Cancer Sister        breast   Social History:  reports that she quit smoking about 43 years ago. Her smoking use included cigarettes. She has a 4.00 pack-year smoking history. She has never used smokeless tobacco. She reports that she does not drink alcohol and does not use drugs.  Allergies:  Allergies  Allergen Reactions  . Albuterol Palpitations and Other (See Comments)    irregular heartbeat    (Not in a hospital admission)   Results for orders placed or performed during the hospital encounter of 01/08/21 (from the past 48 hour(s))  Basic metabolic panel     Status: Abnormal   Collection Time: 01/08/21  2:43 PM  Result Value Ref Range   Sodium 141 135 - 145 mmol/L   Potassium 3.4 (L) 3.5 - 5.1 mmol/L   Chloride 107 98 - 111 mmol/L   CO2 24 22 - 32 mmol/L   Glucose, Bld 97 70 - 99 mg/dL    Comment: Glucose reference range applies only to samples taken after fasting for at least 8 hours.   BUN 15 8 - 23 mg/dL   Creatinine, Ser 1.18 (H) 0.44 - 1.00 mg/dL   Calcium 8.7 (L)  8.9 - 10.3 mg/dL   GFR, Estimated 46 (L) >60 mL/min    Comment: (NOTE) Calculated using the CKD-EPI Creatinine Equation (2021)    Anion gap 10 5 - 15    Comment: Performed at [facility], [place] [address], [place], [place] [address]  CBC     Status: Abnormal   Collection Time: 01/08/21  2:43 PM  Result Value Ref Range   WBC 6.1 4.0 - 10.5 K/uL   RBC 3.92 3.87 - 5.11 MIL/uL   Hemoglobin 11.2 (L) 12.0 - 15.0 g/dL   HCT 36.2 36.0 - 46.0 %   MCV 92.3 80.0 - 100.0 fL   MCH 28.6 26.0 - 34.0 pg   MCHC 30.9 30.0 - 36.0 g/dL   RDW 16.4 (H) 11.5 - 15.5 %   Platelets 248 150 - 400 K/uL   nRBC 0.0 0.0 - 0.2 %    Comment: Performed at [facility] [address] [place], [place] [address]  Troponin I  (High Sensitivity)     Status: None   Collection Time: 01/08/21  2:43 PM  Result Value Ref Range   Troponin I (High Sensitivity) 10 <18 ng/L    Comment: (NOTE) Elevated high sensitivity troponin I (hsTnI) values and significant  changes across serial measurements may suggest ACS but many other  chronic and acute conditions are known to elevate hsTnI results.  Refer to the "Links" section for chest pain algorithms and additional  guidance. Performed at [facility], [place] [address] [place], [place] [address]   Protime-INR (order if Patient is taking Coumadin / Warfarin)     Status: Abnormal   Collection Time: 01/08/21  2:43 PM  Result Value Ref Range   Prothrombin Time 15.7 (H) 11.4 - 15.2 seconds   INR 1.2 0.8 - 1.2    Comment: (NOTE) INR goal varies based on device and disease states. Performed at [facility], [place] [address], [place], [place] [address]   Brain natriuretic peptide     Status: None   Collection Time: 01/08/21  2:43 PM  Result Value Ref Range   B Natriuretic Peptide 26.7 0.0 - 100.0 pg/mL    Comment: Performed at [facility] [address] [place], [place] [address]  Hepatic function panel     Status: Abnormal   Collection Time: 01/08/21  2:43 PM  Result Value Ref Range   Total Protein 6.3 (L) 6.5 - 8.1 g/dL   Albumin 3.0 (L) 3.5 - 5.0 g/dL   AST 25 15 - 41 U/L   ALT 13 0 - 44 U/L   Alkaline Phosphatase 109 38 - 126 U/L   Total Bilirubin 1.1 0.3 - 1.2 mg/dL   Bilirubin, Direct 0.2 0.0 - 0.2 mg/dL   Indirect Bilirubin 0.9 0.3 - 0.9 mg/dL    Comment: Performed at [facility] [address] [place], [place] [address]  Troponin I (High Sensitivity)     Status: None   Collection Time: 01/08/21  3:42 PM  Result Value Ref Range   Troponin I (High Sensitivity) 10 <18 ng/L    Comment: (NOTE) Elevated high sensitivity troponin I (hsTnI) values and significant  changes across serial measurements may suggest ACS but many other  chronic  and acute conditions are known to elevate hsTnI results.  Refer to the "Links" section for chest pain algorithms and additional  guidance. Performed at [facility], [place] [address], [place], [place] [address]  Magnesium     Status: None   Collection Time: 01/08/21  3:42 PM  Result Value Ref Range   Magnesium 2.0 1.7 - 2.4 mg/dL    Comment: Performed at [facility], [place] [address], [place], [place] [address]   No results found.  Review of Systems  Constitutional: Negative for chills and fever.  HENT: Negative for sore throat.   Eyes: Negative for discharge.  Respiratory: Positive for shortness of breath.   Cardiovascular: Positive for leg swelling. Negative for chest pain and palpitations.  Gastrointestinal: Negative for abdominal distention.  Genitourinary: Negative for difficulty urinating.  Neurological: Negative for dizziness.    Blood pressure 134/68, pulse 61, temperature 98.3 F (36.8 C), resp. rate 15, height 5' 7.25" (1.708 m), weight 106.6 kg, SpO2 99 %. Physical Exam Constitutional:      General: She is not in acute distress.    Appearance: Normal appearance.  Eyes:     Extraocular Movements: Extraocular movements intact.     Pupils: Pupils are equal, round, and reactive to light.  Neck:     Vascular: No carotid bruit.  Cardiovascular:     Rate and Rhythm: Normal rate and regular rhythm.     Heart sounds: Murmur (2/6 systolic murmur noted no S3 gallop paradoxical splitting of second heart sound) heard.    Pulmonary:     Comments: Decreased breath sounds at the bases no rales or rhonchi noted Abdominal:     General: Bowel sounds are normal. There is distension.     Palpations: Abdomen is soft.     Tenderness: There is no abdominal tenderness.  Musculoskeletal:        General: Swelling (3+ edema right leg 2+ edema left leg) present.     Cervical back: Normal range of motion and neck supple. No rigidity.  Skin:    General: Skin is dry.      Findings: Erythema (Right leg noted) present.  Neurological:     General: No focal deficit present.     Mental Status: She is alert and oriented to person, place, and time.      Assessment/Plan Cellulitis of the right leg rule out DVT Chronic leg edema Compensated diastolic congestive heart failure Hypertension Hyperlipidemia Morbid obesity History of complete heart block status post permanent pacemaker History of paroxysmal A. fib CHA2DS2-VASc score of 6 on chronic anticoagulation Subtherapeutic INR Emphysema Degenerative joint disease Plan As per orders   Charolette Forward, MD 01/08/2021, 5:44 PM

## 2021-01-08 NOTE — ED Provider Notes (Signed)
[facility] EMERGENCY DEPARTMENT Provider Note   CSN: [id number] Arrival date & time: 01/08/21  1316     History No chief complaint on file.   [name] is a 80 y.o. female.  The history is provided by the patient and medical records.  Shortness of Breath Severity:  Moderate Onset quality:  Gradual Duration:  2 days Timing:  Constant Progression:  Worsening Chronicity:  Recurrent Context: activity   Context: not animal exposure, not emotional upset, not fumes, not known allergens, not occupational exposure, not pollens, not smoke exposure, not strong odors, not URI and not weather changes   Relieved by:  Nothing Worsened by:  Exertion and activity Ineffective treatments:  Diuretics, rest, position changes, sitting up and inhaler Associated symptoms: PND   Associated symptoms: no abdominal pain, no chest pain, no claudication, no cough, no diaphoresis, no ear pain, no fever, no headaches, no hemoptysis, no neck pain, no rash, no sore throat, no sputum production, no syncope, no swollen glands, no vomiting and no wheezing   Risk factors: obesity    History of CHF, COPD.  Presenting to the emergency department with worsening lower extremity swelling bilaterally over the past 2-3 days despite taking her Lasix as prescribed.  She is also having significant worsening shortness of breath with exertion as well as paroxysmal nocturnal dyspnea.  Unable to lay flat. Not on oxygen at baseline.  Has been taking 80mg  of PO Lasix daily for the past 2 weeks without any change, only worsening LE edema.     Past Medical History:  Diagnosis Date  . Allergic rhinitis   . Anemia    hx due to med  . Arthritis   . Asthma   . CHF (congestive heart failure) ([place])   . Dyspnea   . Emphysema of lung ([place])   . GERD (gastroesophageal reflux disease)   . Hyperlipidemia   . Hypertension   . Pacemaker   . PAT (paroxysmal atrial tachycardia) ([place]) 2008  . Pneumonia    hx  .  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 Atrial fibrillation ([place]) 04/06/2020  . Dehydration 01/29/2020  . Shortness of breath 01/02/2020  . Congestive heart failure with left ventricular diastolic dysfunction, acute ([place]) 06/03/2019  . Acute on chronic diastolic CHF (congestive heart failure) ([place]) 06/03/2019  . GERD (gastroesophageal reflux disease) 06/11/2018  . Tachy-brady syndrome ([place]) 05/11/2015  . Severe obesity (BMI >= 40) ([place]) 05/03/2015  . Essential hypertension 02/03/2015  . Dyspnea 02/02/2015  . Chronic diastolic heart failure ([place]) 01/04/2015  . 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 Complete heart block ([place]) 05/09/2011  . Obesity, Class III, BMI 40-49.9 (morbid obesity) ([place]) 05/09/2011  . 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 PAROXYSMAL ATRIAL TACHYCARDIA 06/01/2007  . Allergic rhinitis 06/01/2007  . Mild persistent asthma 06/01/2007    Past Surgical History:  Procedure Laterality Date  . ABDOMINAL HYSTERECTOMY    . BREAST BIOPSY  04/30/2012   Procedure: BREAST BIOPSY WITH NEEDLE LOCALIZATION;  Surgeon: Rolm Bookbinder, MD;  Location: [facility];  Service: General;  Laterality: Left;  Left breast wire localization biospy  . HEMORRHOID SURGERY    . PACEMAKER INSERTION  02-06-2011  . PPM GENERATOR CHANGEOUT N/A 04/19/2020   Procedure: PPM GENERATOR CHANGEOUT;  Surgeon: Evans Lance, MD;  Location: [facility];  Service: Cardiovascular;  Laterality: N/A;  .  TAH and BSO    . TONSILLECTOMY       OB History   No obstetric history on file.     Family History  Problem Relation Age of Onset  . Heart disease Mother   . Cancer Sister        breast    Social History   Tobacco Use  . Smoking status: Former Smoker    Packs/day: 1.00    Years: 4.00    Pack years: 4.00    Types: Cigarettes    Quit date: 08/07/1977    Years since quitting: 43.4  . Smokeless tobacco: Never Used  Vaping  Use  . Vaping Use: Never used  Substance Use Topics  . Alcohol use: No    Alcohol/week: 0.0 standard drinks    Comment: quit 1979  . Drug use: No    Home Medications Prior to Admission medications   Medication Sig Start Date End Date Taking? Authorizing Provider  acetaminophen (TYLENOL) 325 MG tablet Take 2 tablets (650 mg total) by mouth every 4 (four) hours as needed for headache or mild pain. 06/05/19  Yes Dixie Dials, MD  atorvastatin (LIPITOR) 20 MG tablet Take 20 mg by mouth daily at 6 PM.  04/15/19  Yes [provider]  budesonide-formoterol (SYMBICORT) 80-4.5 MCG/ACT inhaler Inhale 2 puffs into the lungs 2 (two) times daily. 05/24/18  Yes Lauraine Rinne, NP  furosemide (LASIX) 40 MG tablet Take 1 tablet (40 mg total) by mouth every Monday, Wednesday, and Friday. Patient taking differently: Take 80 mg by mouth every Monday, Wednesday, and Friday. 01/30/20  Yes Dixie Dials, MD  levalbuterol [facility] HFA) 45 MCG/ACT inhaler Inhale 2 puffs into the lungs every 4 (four) hours as needed for wheezing. 05/31/18  Yes Lauraine Rinne, NP  montelukast (SINGULAIR) 10 MG tablet TAKE 1 TABLET BY MOUTH AT BEDTIME EVERY NIGHT Patient taking differently: Take 10 mg by mouth at bedtime. 05/21/18  Yes Lauraine Rinne, NP  omeprazole (PRILOSEC) 20 MG capsule Take 1 capsule (20 mg total) by mouth daily. Take 30-60 min before first meal of the day 05/14/18  Yes Lauraine Rinne, NP  spironolactone (ALDACTONE) 25 MG tablet Take 0.5 tablets (12.5 mg total) by mouth daily. 01/30/20  Yes Dixie Dials, MD  warfarin (COUMADIN) 5 MG tablet Take 0.5 tablets (2.5 mg total) by mouth daily at 4 PM. 01/31/20  Yes Dixie Dials, MD    Allergies    Albuterol  Review of Systems   Review of Systems  Constitutional: Positive for fatigue. Negative for chills, diaphoresis and fever.  HENT: Negative for ear pain and sore throat.   Eyes: Negative for pain and visual disturbance.  Respiratory: Positive for shortness of  breath. Negative for cough, hemoptysis, sputum production and wheezing.   Cardiovascular: Positive for leg swelling and PND. Negative for chest pain, palpitations, claudication and syncope.  Gastrointestinal: Negative for abdominal pain and vomiting.  Genitourinary: Negative for dysuria and hematuria.  Musculoskeletal: Negative for arthralgias, back pain and neck pain.  Skin: Negative for color change and rash.  Neurological: Negative for seizures, syncope and headaches.  All other systems reviewed and are negative.   Physical Exam Updated Vital Signs BP 134/68   Pulse 61   Temp 98.3 F (36.8 C)   Resp 15   Ht 5' 7.25" (1.708 m)   Wt 106.6 kg   SpO2 99%   BMI 36.53 kg/m   Physical Exam Vitals and nursing note reviewed.  Constitutional:  General: She is not in acute distress.    Appearance: She is well-developed. She is ill-appearing (chronically).     Comments:  Elderly  HENT:     Head: Normocephalic and atraumatic.  Eyes:     Conjunctiva/sclera: Conjunctivae normal.  Cardiovascular:     Rate and Rhythm: Normal rate and regular rhythm.     Heart sounds: No murmur heard.   Pulmonary:     Effort: Pulmonary effort is normal. No tachypnea, accessory muscle usage, respiratory distress or retractions.     Breath sounds: Examination of the right-lower field reveals rales. Examination of the left-lower field reveals rales. Rales (slight) present.  Abdominal:     Palpations: Abdomen is soft.     Tenderness: There is no abdominal tenderness.  Musculoskeletal:     Cervical back: Neck supple.     Right lower leg: 3+ Edema (up to the proximal thigh) present.     Left lower leg: 3+ Edema (up to the proximal thigh) present.  Skin:    General: Skin is warm and dry.  Neurological:     General: No focal deficit present.     Mental Status: She is alert and oriented to person, place, and time.     GCS: GCS eye subscore is 4. GCS verbal subscore is 5. GCS motor subscore is 6.      Cranial Nerves: Cranial nerves are intact.     Sensory: Sensation is intact.     ED Results / Procedures / Treatments   Labs (all labs ordered are listed, but only abnormal results are displayed) Labs Reviewed  BASIC METABOLIC PANEL - Abnormal; Notable for the following components:      Result Value   Potassium 3.4 (*)    Creatinine, Ser 1.18 (*)    Calcium 8.7 (*)    GFR, Estimated 46 (*)    All other components within normal limits  CBC - Abnormal; Notable for the following components:   Hemoglobin 11.2 (*)    RDW 16.4 (*)    All other components within normal limits  PROTIME-INR - Abnormal; Notable for the following components:   Prothrombin Time 15.7 (*)    All other components within normal limits  HEPATIC FUNCTION PANEL - Abnormal; Notable for the following components:   Total Protein 6.3 (*)    Albumin 3.0 (*)    All other components within normal limits  URINALYSIS, ROUTINE W REFLEX MICROSCOPIC - Abnormal; Notable for the following components:   Ketones, ur 5 (*)    All other components within normal limits  RESP PANEL BY RT-PCR (FLU A&B, COVID) ARPGX2  BRAIN NATRIURETIC PEPTIDE  MAGNESIUM  PROTEIN ELECTRO, RANDOM URINE  TROPONIN I (HIGH SENSITIVITY)  TROPONIN I (HIGH SENSITIVITY)    EKG EKG Interpretation  Date/Time:  Saturday January 08 2021 13:39:18 EDT Ventricular Rate:  80 PR Interval:  162 QRS Duration: 136 QT Interval:  464 QTC Calculation: 535 R Axis:   -4 Text Interpretation: Atrial-sensed ventricular-paced rhythm Abnormal ECG Confirmed by Sherwood Gambler [id number]) on 01/08/2021 3:06:02 PM   Radiology No results found.  Procedures Procedures   Medications Ordered in ED Medications  furosemide (LASIX) injection 40 mg (40 mg Intravenous Given 01/08/21 1616)  potassium chloride (KLOR-CON) packet 40 mEq (40 mEq Oral Given 01/08/21 1549)  furosemide (LASIX) injection 40 mg (40 mg Intravenous Given 01/08/21 1617)    ED Course  I have reviewed the triage  vital signs and the nursing notes.  Pertinent labs & imaging results  that were available during my care of the patient were reviewed by me and considered in my medical decision making (see chart for details).  Clinical Course as of 01/08/21 1802  Sat Jan 08, 2021  1529 Troponin negative which is reassuring.  Still low index of suspicion for ACS. Slightly worsening renal function since prior with hypokalemia.  Likely in the setting of cardiorenal syndrome. Ordered for IV dose of Lasix with p.o. potassium supplementation [ZB]  1530 . [ZB]  9470 On further evaluation, patient is actually taking 80mg  lasix daily.  I will give her a total of 80mg  IV Lasix in the ED for diuresis. [ZB]  9628 Admitted to Cardiology for further treatment. I called and updated the patient's daughter in law at her request.  [ZB]    Clinical Course User Index [ZB] Pearson Grippe, DO   MDM Rules/Calculators/A&P                          This is an 80 year old female with history as above including CHF and COPD who presented to the emergency department with worsening lower extremity edema bilaterally as well as dyspnea on exertion, paroxysmal nocturnal dyspnea despite diuretic regimen at home. Emergency department she was hemodynamically stable, not hypertensive, not hypoxic on room air. She has not been having chest pain.  EKG without evidence of acute ischemia, paced rhythm. Her exam most consistent with significant hypervolemia with 3+ pitting edema up to the level of the proximal thighs bilaterally.  Considering CHF exacerbation, cor pulmonale, renal failure/dysfunction, metabolic/electrolyte derangement.  I have a low index of suspicion for bilateral DVT, anticoagulated on Coumadin. No clear evidence of cellulitis.  See clinical course above for ED course.  Admit to her cardiologist for further tx.  Final Clinical Impression(s) / ED Diagnoses Final diagnoses:  Bilateral lower extremity edema  Dyspnea on  minimal exertion    Rx / DC Orders ED Discharge Orders    None       Pearson Grippe, DO 01/08/21 1803    Sherwood Gambler, MD 01/11/21 1527

## 2021-01-08 NOTE — ED Triage Notes (Signed)
Patient complains of increased bilateral lower extremity swelling x 1 day and increased SOB with exertion. Patient alert and oriented, denies CP

## 2021-01-08 NOTE — Plan of Care (Signed)
  Problem: Education: Goal: Knowledge of General Education information will improve Description: Including pain rating scale, medication(s)/side effects and non-pharmacologic comfort measures Outcome: Progressing   Problem: Clinical Measurements: Goal: Ability to maintain clinical measurements within normal limits will improve Outcome: Progressing   Problem: Clinical Measurements: Goal: Will remain free from infection Outcome: Progressing   Problem: Clinical Measurements: Goal: Diagnostic test results will improve Outcome: Progressing   Problem: Nutrition: Goal: Adequate nutrition will be maintained Outcome: Progressing   Problem: Coping: Goal: Level of anxiety will decrease Outcome: Progressing   Problem: Safety: Goal: Ability to remain free from injury will improve Outcome: Progressing   Problem: Skin Integrity: Goal: Risk for impaired skin integrity will decrease Outcome: Progressing

## 2021-01-08 NOTE — ED Notes (Signed)
Son called advised he lives in [place] was just informed his mother was here and would like a status update--[name]. [phone]

## 2021-01-08 NOTE — Progress Notes (Signed)
ANTICOAGULATION CONSULT NOTE - Initial Consult  Pharmacy Consult for heparin + warfarin Indication: atrial fibrillation  Allergies  Allergen Reactions  . Albuterol Palpitations and Other (See Comments)    irregular heartbeat    Patient Measurements: Height: 5' 7.25" (170.8 cm) Weight: 106.6 kg (235 lb) IBW/kg (Calculated) : 62.18 Heparin Dosing Weight: 86.4kg  Vital Signs: Temp: 98.3 F (36.8 C) (06/04 1348) BP: 134/68 (06/04 1730) Pulse Rate: 61 (06/04 1730)  Labs: Recent Labs    01/08/21 1443 01/08/21 1542  HGB 11.2*  --   HCT 36.2  --   PLT 248  --   LABPROT 15.7*  --   INR 1.2  --   CREATININE 1.18*  --   TROPONINIHS 10 10    Estimated Creatinine Clearance: 46.4 mL/min (A) (by C-G formula based on SCr of 1.18 mg/dL (H)).   Medical History: Past Medical History:  Diagnosis Date  . Allergic rhinitis   . Anemia    hx due to med  . Arthritis   . Asthma   . CHF (congestive heart failure) ([place])   . Dyspnea   . Emphysema of lung ([place])   . GERD (gastroesophageal reflux disease)   . Hyperlipidemia   . Hypertension   . Pacemaker   . PAT (paroxysmal atrial tachycardia) ([place]) 2008  . Pneumonia    hx  . TB of kidney 6's   rxd 3 yrs meds, no surgery in kidneys   Assessment: 62 YOF presenting with LEE and SOB, on warfarin PTA for afib, INR 1.2 on admission, last dose taken 6/3.  Chronic anemia stable, plts 248.    PTA dosing: 2.5 mg daily  Goal of Therapy:  INR 2-3 Heparin level 0.3-0.7 units/ml Monitor platelets by anticoagulation protocol: Yes   Plan:  Warfarin 7.5mg  PO x 1 today Heparin 4000 units IV x 1, and gtt at 1200 units/hr F/u 8 hour heparin level, daily INR, CBC, s/s bleeding F/u [facility] plan   Bertis Ruddy, PharmD Clinical Pharmacist ED Pharmacist Phone # [phone] 01/08/2021 6:34 PM

## 2021-01-09 ENCOUNTER — Inpatient Hospital Stay (HOSPITAL_COMMUNITY): Payer: Medicare HMO

## 2021-01-09 DIAGNOSIS — R609 Edema, unspecified: Secondary | ICD-10-CM

## 2021-01-09 LAB — CBC
HCT: 35.8 % — ABNORMAL LOW (ref 36.0–46.0)
Hemoglobin: 11 g/dL — ABNORMAL LOW (ref 12.0–15.0)
MCH: 28.1 pg (ref 26.0–34.0)
MCHC: 30.7 g/dL (ref 30.0–36.0)
MCV: 91.3 fL (ref 80.0–100.0)
Platelets: 236 10*3/uL (ref 150–400)
RBC: 3.92 MIL/uL (ref 3.87–5.11)
RDW: 16.3 % — ABNORMAL HIGH (ref 11.5–15.5)
WBC: 7 10*3/uL (ref 4.0–10.5)
nRBC: 0 % (ref 0.0–0.2)

## 2021-01-09 LAB — LIPID PANEL
Cholesterol: 213 mg/dL — ABNORMAL HIGH (ref 0–200)
HDL: 62 mg/dL (ref >40)
LDL Cholesterol: 145 mg/dL — ABNORMAL HIGH (ref 0–99)
Total CHOL/HDL Ratio: 3.4 RATIO
Triglycerides: 32 mg/dL (ref <150)
VLDL: 6 mg/dL (ref 0–40)

## 2021-01-09 LAB — PROTIME-INR
INR: 1.4 — ABNORMAL HIGH (ref 0.8–1.2)
Prothrombin Time: 16.8 seconds — ABNORMAL HIGH (ref 11.4–15.2)

## 2021-01-09 LAB — HEPARIN LEVEL (UNFRACTIONATED)
Heparin Unfractionated: 0.94 IU/mL — ABNORMAL HIGH (ref 0.30–0.70)
Heparin Unfractionated: 0.71 IU/mL — ABNORMAL HIGH (ref 0.30–0.70)

## 2021-01-09 MED ORDER — WARFARIN SODIUM 7.5 MG PO TABS
7.5000 mg | ORAL_TABLET | Freq: Once | ORAL | Status: AC
  Administered 2021-01-09: 7.5 mg via ORAL
  Filled 2021-01-09: qty 1

## 2021-01-09 NOTE — Plan of Care (Signed)

## 2021-01-09 NOTE — Progress Notes (Signed)
[facility] for heparin + warfarin Indication: atrial fibrillation  Allergies  Allergen Reactions  . Albuterol Palpitations and Other (See Comments)    irregular heartbeat    Patient Measurements: Height: 5' 7.5" (171.5 cm) Weight: 104 kg (229 lb 4.5 oz) IBW/kg (Calculated) : 62.75 Heparin Dosing Weight: 86.4kg  Vital Signs: Temp: 97.8 F (36.6 C) (06/05 0700) Temp Source: Oral (06/05 0700) BP: 98/61 (06/05 0800) Pulse Rate: 60 (06/05 0700)  Labs: Recent Labs    01/08/21 1443 01/08/21 1542 01/09/21 0053 01/09/21 0952  HGB 11.2*  --  11.0*  --   HCT 36.2  --  35.8*  --   PLT 248  --  236  --   LABPROT 15.7*  --  16.8*  --   INR 1.2  --  1.4*  --   HEPARINUNFRC  --   --  0.94* 0.71*  CREATININE 1.18*  --   --   --   TROPONINIHS 10 10  --   --     Estimated Creatinine Clearance: 46 mL/min (A) (by C-G formula based on SCr of 1.18 mg/dL (H)).   Medical History: Past Medical History:  Diagnosis Date  . Allergic rhinitis   . Anemia    hx due to med  . Arthritis   . Asthma   . CHF (congestive heart failure) ([place])   . Dyspnea   . Emphysema of lung ([place])   . GERD (gastroesophageal reflux disease)   . Hyperlipidemia   . Hypertension   . Pacemaker   . PAT (paroxysmal atrial tachycardia) ([place]) 2008  . Pneumonia    hx  . TB of kidney 77's   rxd 3 yrs meds, no surgery in kidneys   Assessment: 7 YOF presenting with LEE and SOB, on warfarin PTA for afib, INR 1.2 on admission, last dose taken 6/3.  Chronic anemia stable, plts 248.    INR up but remains subtherapeutic 1.4. Heparin level just slightly supratherapeutic (0.71), on drip rate 1000 units/hr. CBC stable. No overt bleeding or infusion issues noted.  PTA dosing: 2.5 mg daily  Goal of Therapy:  INR 2-3 Heparin level 0.3-0.7 units/ml Monitor platelets by anticoagulation protocol: Yes   Plan:  Warfarin 7.5 mg PO x1 again Decrease heparin infusion to 900  units/hr Check HL with AM labs Daily HL, CBC, s/sx bleeding  Richardine Service, PharmD, BCPS PGY2 Cardiology Pharmacy Resident Phone: [phone] 01/09/2021  12:07 PM  Please check [email or web address] for unit-specific pharmacy phone numbers.

## 2021-01-09 NOTE — Progress Notes (Signed)
Lower extremity venous has been completed.   Preliminary results in CV Proc.   Abram Sander 01/09/2021 11:22 AM

## 2021-01-09 NOTE — Progress Notes (Signed)
[facility] for heparin + warfarin Indication: atrial fibrillation  Allergies  Allergen Reactions  . Albuterol Palpitations and Other (See Comments)    irregular heartbeat    Patient Measurements: Height: 5' 7.5" (171.5 cm) Weight: 104 kg (229 lb 4.5 oz) IBW/kg (Calculated) : 62.75 Heparin Dosing Weight: 86.4kg  Vital Signs: Temp: 97.5 F (36.4 C) (06/04 2341) Temp Source: Oral (06/04 2341) BP: 117/63 (06/04 2341) Pulse Rate: 62 (06/04 2341)  Labs: Recent Labs    01/08/21 1443 01/08/21 1542 01/09/21 0053  HGB 11.2*  --  11.0*  HCT 36.2  --  35.8*  PLT 248  --  236  LABPROT 15.7*  --  16.8*  INR 1.2  --  1.4*  HEPARINUNFRC  --   --  0.94*  CREATININE 1.18*  --   --   TROPONINIHS 10 10  --     Estimated Creatinine Clearance: 46 mL/min (A) (by C-G formula based on SCr of 1.18 mg/dL (H)).   Medical History: Past Medical History:  Diagnosis Date  . Allergic rhinitis   . Anemia    hx due to med  . Arthritis   . Asthma   . CHF (congestive heart failure) ([place])   . Dyspnea   . Emphysema of lung ([place])   . GERD (gastroesophageal reflux disease)   . Hyperlipidemia   . Hypertension   . Pacemaker   . PAT (paroxysmal atrial tachycardia) ([employer]) 2008  . Pneumonia    hx  . TB of kidney 83's   rxd 3 yrs meds, no surgery in kidneys   Assessment: 75 YOF presenting with LEE and SOB, on warfarin PTA for afib, INR 1.2 on admission, last dose taken 6/3.  Chronic anemia stable, plts 248.    PTA dosing: 2.5 mg daily  6/5 AM update:  Heparin level elevated Drawn a little early  Goal of Therapy:  INR 2-3 Heparin level 0.3-0.7 units/ml Monitor platelets by anticoagulation protocol: Yes   Plan:  Dec heparin to 1000 units/hr 1000 heparin level  Narda Bonds, PharmD, BCPS Clinical Pharmacist Phone: [phone]

## 2021-01-09 NOTE — Progress Notes (Signed)
Subjective:  Patient denies any chest pain or shortness of breath states leg swelling has Slightly improved feels overall better  Objective:  Vital Signs in the last 24 hours: Temp:  [97.5 F (36.4 C)-98.3 F (36.8 C)] 97.8 F (36.6 C) (06/05 0700) Pulse Rate:  [56-81] 60 (06/05 0700) Resp:  [13-27] 27 (06/05 0700) BP: (97-138)/(56-86) 97/56 (06/05 0700) SpO2:  [95 %-100 %] 95 % (06/05 0700) Weight:  [104 kg-106.6 kg] 104 kg (06/04 2016)  Intake/Output from previous day: 06/04 0701 - 06/05 0700 In: 695.3 [P.O.:480; I.V.:115.3; IV Piggyback:100] Out: 1650 [Urine:1650] Intake/Output from this shift: No intake/output data recorded.  Physical Exam: Neck: no adenopathy, no carotid bruit, no JVD and supple, symmetrical, trachea midline Lungs: Decreased breath sounds at bases Heart: regular rate and rhythm, S1, S2 normal and New onset systolic murmur and paradoxical splitting of second heart sound noted Abdomen: soft, non-tender; bowel sounds normal; no masses,  no organomegaly Extremities: No clubbing cyanosis diffuse edema with erythema noted  Lab Results: Recent Labs    01/08/21 1443 01/09/21 0053  WBC 6.1 7.0  HGB 11.2* 11.0*  PLT 248 236   Recent Labs    01/08/21 1443  NA 141  K 3.4*  CL 107  CO2 24  GLUCOSE 97  BUN 15  CREATININE 1.18*   No results for input(s): TROPONINI in the last 72 hours.  Invalid input(s): CK, MB Hepatic Function Panel Recent Labs    01/08/21 1443  PROT 6.3*  ALBUMIN 3.0*  AST 25  ALT 13  ALKPHOS 109  BILITOT 1.1  BILIDIR 0.2  IBILI 0.9   Recent Labs    01/09/21 0053  CHOL 213*   No results for input(s): PROTIME in the last 72 hours.  Imaging: Imaging results have been reviewed and DG Chest 2 View  Result Date: 01/08/2021 CLINICAL DATA:  Chest pain down arm, fluid retention, cough, shortness of breath, RIGHT leg swelling EXAM: CHEST - 2 VIEW COMPARISON:  01/29/2020 FINDINGS: LEFT subclavian sequential transvenous  pacemaker leads project at RIGHT atrium and RIGHT ventricle. Normal heart size, mediastinal contours, and pulmonary vascularity. Subsegmental atelectasis versus scarring LEFT lower lobe unchanged. No acute infiltrate, pleural effusion or pneumothorax. Bones demineralized. IMPRESSION: Chronic atelectasis versus scarring in LEFT lower lobe. No acute abnormalities. Electronically Signed   By: Lavonia Dana M.D.   On: 01/08/2021 14:25    Cardiac Studies:  Assessment/Plan:  Cellulitis of the right leg rule out DVT Chronic leg edema Compensated diastolic congestive heart failure Hypertension Hyperlipidemia Morbid obesity History of complete heart block status post permanent pacemaker History of paroxysmal A. fib CHA2DS2-VASc score of 6 on chronic anticoagulation Subtherapeutic INR Emphysema Degenerative joint disease Plan Continue present management PT consult Continue heparin and Coumadin per pharmacy protocol Social service for discharge planning  LOS: 1 day    Charolette Forward 01/09/2021, 9:15 AM

## 2021-01-10 LAB — HEPARIN LEVEL (UNFRACTIONATED): Heparin Unfractionated: 0.61 IU/mL (ref 0.30–0.70)

## 2021-01-10 LAB — BASIC METABOLIC PANEL
Anion gap: 11 (ref 5–15)
BUN: 13 mg/dL (ref 8–23)
CO2: 20 mmol/L — ABNORMAL LOW (ref 22–32)
Calcium: 7.9 mg/dL — ABNORMAL LOW (ref 8.9–10.3)
Chloride: 107 mmol/L (ref 98–111)
Creatinine, Ser: 1.43 mg/dL — ABNORMAL HIGH (ref 0.44–1.00)
GFR, Estimated: 37 mL/min — ABNORMAL LOW (ref >60)
Glucose, Bld: 97 mg/dL (ref 70–99)
Potassium: 4 mmol/L (ref 3.5–5.1)
Sodium: 138 mmol/L (ref 135–145)

## 2021-01-10 LAB — CBC
HCT: 34.1 % — ABNORMAL LOW (ref 36.0–46.0)
Hemoglobin: 10.8 g/dL — ABNORMAL LOW (ref 12.0–15.0)
MCH: 28.6 pg (ref 26.0–34.0)
MCHC: 31.7 g/dL (ref 30.0–36.0)
MCV: 90.2 fL (ref 80.0–100.0)
Platelets: 190 10*3/uL (ref 150–400)
RBC: 3.78 MIL/uL — ABNORMAL LOW (ref 3.87–5.11)
RDW: 16.4 % — ABNORMAL HIGH (ref 11.5–15.5)
WBC: 6.9 10*3/uL (ref 4.0–10.5)
nRBC: 0 % (ref 0.0–0.2)

## 2021-01-10 LAB — PROTIME-INR
INR: 1.5 — ABNORMAL HIGH (ref 0.8–1.2)
Prothrombin Time: 18.4 seconds — ABNORMAL HIGH (ref 11.4–15.2)

## 2021-01-10 LAB — MAGNESIUM: Magnesium: 1.7 mg/dL (ref 1.7–2.4)

## 2021-01-10 MED ORDER — WARFARIN SODIUM 7.5 MG PO TABS
7.5000 mg | ORAL_TABLET | Freq: Once | ORAL | Status: AC
  Administered 2021-01-10: 7.5 mg via ORAL
  Filled 2021-01-10: qty 1

## 2021-01-10 NOTE — Plan of Care (Signed)

## 2021-01-10 NOTE — Progress Notes (Signed)
[facility] for heparin + warfarin Indication: atrial fibrillation  Allergies  Allergen Reactions  . Albuterol Palpitations and Other (See Comments)    irregular heartbeat    Patient Measurements: Height: 5' 7.5" (171.5 cm) Weight: 104 kg (229 lb 4.5 oz) IBW/kg (Calculated) : 62.75 Heparin Dosing Weight: 86.4kg  Vital Signs: Temp: 97.5 F (36.4 C) (06/06 1105) Temp Source: Oral (06/06 1105) BP: 92/52 (06/06 1105) Pulse Rate: 62 (06/06 1105)  Labs: Recent Labs    01/08/21 1443 01/08/21 1542 01/09/21 0053 01/09/21 0952 01/10/21 0132 01/10/21 0426  HGB 11.2*  --  11.0*  --  10.8*  --   HCT 36.2  --  35.8*  --  34.1*  --   PLT 248  --  236  --  190  --   LABPROT 15.7*  --  16.8*  --   --  18.4*  INR 1.2  --  1.4*  --   --  1.5*  HEPARINUNFRC  --   --  0.94* 0.71*  --  0.61  CREATININE 1.18*  --   --   --  1.43*  --   TROPONINIHS 10 10  --   --   --   --     Estimated Creatinine Clearance: 38 mL/min (A) (by C-G formula based on SCr of 1.43 mg/dL (H)).   Medical History: Past Medical History:  Diagnosis Date  . Allergic rhinitis   . Anemia    hx due to med  . Arthritis   . Asthma   . CHF (congestive heart failure) ([place])   . Dyspnea   . Emphysema of lung ([place])   . GERD (gastroesophageal reflux disease)   . Hyperlipidemia   . Hypertension   . Pacemaker   . PAT (paroxysmal atrial tachycardia) ([place]) 2008  . Pneumonia    hx  . TB of kidney 67's   rxd 3 yrs meds, no surgery in kidneys   Assessment: 65 YOF presenting with LEE and SOB, on warfarin PTA for afib, INR 1.2 on admission, last dose taken 6/3.  Chronic anemia stable, plts 248.    INR slowly trending up but remains subtherapeutic 1.5. Heparin level at goal, on drip rate 900 units/hr. CBC stable. No overt bleeding or infusion issues noted.  PTA dosing: 2.5 mg daily  Goal of Therapy:  INR 2-3 Heparin level 0.3-0.7 units/ml Monitor platelets by anticoagulation  protocol: Yes   Plan:  Warfarin 7.5 mg PO x1 again Continue heparin infusion at 900 units/hr Check HL with AM labs Daily HL, CBC, s/sx bleeding  Erin Hearing PharmD., BCPS Clinical Pharmacist 01/10/2021 2:12 PM  Please check [email or web address] for unit-specific pharmacy phone numbers.

## 2021-01-10 NOTE — Progress Notes (Signed)
Subjective:  Patient denies any chest pain or shortness of breath leg swelling persist but erythema improving.  Denies any fever or chills.  INR still subtherapeutic.  Had episode of hypotension yesterday renal function slightly worsened.  Objective:  Vital Signs in the last 24 hours: Temp:  [97.5 F (36.4 C)-98.7 F (37.1 C)] 97.5 F (36.4 C) (06/06 1105) Pulse Rate:  [60-75] 62 (06/06 1105) Resp:  [14-19] 15 (06/06 1105) BP: (92-114)/(49-66) 92/52 (06/06 1105) SpO2:  [97 %-100 %] 97 % (06/06 1105)  Intake/Output from previous day: 06/05 0701 - 06/06 0700 In: 236.8 [I.V.:236.8] Out: 1700 [Urine:1700] Intake/Output from this shift: No intake/output data recorded.  Physical Exam: Neck: no adenopathy, no carotid bruit, no JVD and supple, symmetrical, trachea midline Lungs: clear to auscultation bilaterally Heart: regular rate and rhythm, S1, S2 normal and Soft systolic murmur and paradoxical splitting of second heart sound noted Abdomen: soft, non-tender; bowel sounds normal; no masses,  no organomegaly Extremities: No clubbing cyanosis 2+ edema with mild erythema noted  Lab Results: Recent Labs    01/09/21 0053 01/10/21 0132  WBC 7.0 6.9  HGB 11.0* 10.8*  PLT 236 190   Recent Labs    01/08/21 1443 01/10/21 0132  NA 141 138  K 3.4* 4.0  CL 107 107  CO2 24 20*  GLUCOSE 97 97  BUN 15 13  CREATININE 1.18* 1.43*   No results for input(s): TROPONINI in the last 72 hours.  Invalid input(s): CK, MB Hepatic Function Panel Recent Labs    01/08/21 1443  PROT 6.3*  ALBUMIN 3.0*  AST 25  ALT 13  ALKPHOS 109  BILITOT 1.1  BILIDIR 0.2  IBILI 0.9   Recent Labs    01/09/21 0053  CHOL 213*   No results for input(s): PROTIME in the last 72 hours.  Imaging: Imaging results have been reviewed and DG Chest 2 View  Result Date: 01/08/2021 CLINICAL DATA:  Chest pain down arm, fluid retention, cough, shortness of breath, RIGHT leg swelling EXAM: CHEST - 2 VIEW  COMPARISON:  01/29/2020 FINDINGS: LEFT subclavian sequential transvenous pacemaker leads project at RIGHT atrium and RIGHT ventricle. Normal heart size, mediastinal contours, and pulmonary vascularity. Subsegmental atelectasis versus scarring LEFT lower lobe unchanged. No acute infiltrate, pleural effusion or pneumothorax. Bones demineralized. IMPRESSION: Chronic atelectasis versus scarring in LEFT lower lobe. No acute abnormalities. Electronically Signed   By: Lavonia Dana M.D.   On: 01/08/2021 14:25   VAS [place] LOWER EXTREMITY VENOUS (DVT)  Result Date: 01/09/2021  Lower Venous DVT Study Patient Name:  [facility] Santiago Bur  Date of Exam:   01/09/2021 Medical Rec #: [id number]      Accession #:    [id number] Date of Birth: 1941-06-19       Patient Gender: F Patient Age:   80Y Exam Location:  [facility] Procedure:      VAS [place] LOWER EXTREMITY VENOUS (DVT) Referring Phys: [id number] [facility] Torrez Renfroe --------------------------------------------------------------------------------  Indications: Edema.  Comparison Study: 11/10/14 prior Performing Technologist: Archie Patten RVS  Examination Guidelines: A complete evaluation includes B-mode imaging, spectral Doppler, color Doppler, and power Doppler as needed of all accessible portions of each vessel. Bilateral testing is considered an integral part of a complete examination. Limited examinations for reoccurring indications may be performed as noted. The reflux portion of the exam is performed with the patient in reverse Trendelenburg.  +---------+---------------+---------+-----------+----------+-------------------+ RIGHT    CompressibilityPhasicitySpontaneityPropertiesThrombus Aging      +---------+---------------+---------+-----------+----------+-------------------+ CFV  Full           Yes      Yes                                      +---------+---------------+---------+-----------+----------+-------------------+ SFJ      Full                                                              +---------+---------------+---------+-----------+----------+-------------------+ FV Prox  Full                                                             +---------+---------------+---------+-----------+----------+-------------------+ FV Mid   Full                                                             +---------+---------------+---------+-----------+----------+-------------------+ FV DistalFull                                                             +---------+---------------+---------+-----------+----------+-------------------+ PFV      Full                                                             +---------+---------------+---------+-----------+----------+-------------------+ POP      Full           Yes      Yes                                      +---------+---------------+---------+-----------+----------+-------------------+ PTV                                                   Not well visualized +---------+---------------+---------+-----------+----------+-------------------+ PERO                                                  Not well visualized +---------+---------------+---------+-----------+----------+-------------------+   +---------+---------------+---------+-----------+----------+-------------------+ LEFT     CompressibilityPhasicitySpontaneityPropertiesThrombus Aging      +---------+---------------+---------+-----------+----------+-------------------+ CFV      Full           Yes      Yes                                      +---------+---------------+---------+-----------+----------+-------------------+  SFJ      Full                                                             +---------+---------------+---------+-----------+----------+-------------------+ FV Prox  Full                                                              +---------+---------------+---------+-----------+----------+-------------------+ FV Mid   Full                                                             +---------+---------------+---------+-----------+----------+-------------------+ FV DistalFull                                                             +---------+---------------+---------+-----------+----------+-------------------+ PFV      Full                                                             +---------+---------------+---------+-----------+----------+-------------------+ POP      Full           Yes      Yes                                      +---------+---------------+---------+-----------+----------+-------------------+ PTV      Full                                                             +---------+---------------+---------+-----------+----------+-------------------+ PERO                                                  Not well visualized +---------+---------------+---------+-----------+----------+-------------------+     Summary: BILATERAL: - No evidence of deep vein thrombosis seen in the lower extremities, bilaterally. -No evidence of popliteal cyst, bilaterally.   *See table(s) above for measurements and observations. Electronically signed by Ruta Hinds MD on 01/09/2021 at 5:08:51 PM.    Final     Cardiac Studies:  Assessment/Plan:  Resolving cellulitis of the right leg rule out DVT Chronic leg edema Compensated diastolic congestive heart failure Hypertension Hyperlipidemia Morbid obesity History of complete heart block status  post permanent pacemaker History of paroxysmal A. fib CHA2DS2-VASc score of 6 on chronic anticoagulation Subtherapeutic INR Emphysema Degenerative joint disease Acute kidney injury secondary to hypotension Plan Continue present management  Check PT/INR in a.m.   LOS: 2 days    Charolette Forward 01/10/2021, 12:50 PM

## 2021-01-10 NOTE — Evaluation (Addendum)
Physical Therapy Evaluation/ Discharge Patient Details Name: [name] MRN: [id number] DOB: 12/09/1940 Today's Date: 01/10/2021   History of Present Illness  79 yo admitted with bil LE edema with RLE cellulitis. PMHx: HTN, HLD, obesity, Afib, emphysema, CHF, DJD, pacemaker  Clinical Impression  Pt very pleasant and moving well. Pt reports only walking to her mailbox 1x/day as her activity and otherwise being sedentary at home. Family brings meals and pt does not ascend steps at home. Pt educated for walking program and progressive activity from 5-30 walks with pt able to state understanding and reports commitment to improve health. Pt at baseline functional status without further acute needs at this time and encouraged ambulation with nursing/mobility.       Follow Up Recommendations No PT follow up    Equipment Recommendations  None recommended by PT    Recommendations for Other Services       Precautions / Restrictions Precautions Precautions: Fall Restrictions Weight Bearing Restrictions: No      Mobility  Bed Mobility               General bed mobility comments: in chair on arrival and end of session    Transfers Overall transfer level: Modified independent               General transfer comment: pt able to stand from chair and pivot to BSc without assist as well as perform standing pericare  Ambulation/Gait Ambulation/Gait assistance: Modified independent (Device/Increase time) Gait Distance (Feet): 400 Feet Assistive device: Straight cane Gait Pattern/deviations: Step-through pattern;Decreased stride length   Gait velocity interpretation: >2.62 ft/sec, indicative of community ambulatory General Gait Details: decreased speed with good stability with use of cane  Stairs            Wheelchair Mobility    Modified Rankin (Stroke Patients Only)       Balance Overall balance assessment: Mild deficits observed, not formally tested                                            Pertinent Vitals/Pain Pain Assessment: No/denies pain    Home Living Family/patient expects to be discharged to:: Private residence Living Arrangements: Alone Available Help at Discharge: Family Type of Home: House Home Access: Stairs to enter   Technical brewer of Steps: 1 Home Layout: Multi-level;Able to live on main level with bedroom/bathroom Home Equipment: Kasandra Knudsen - single point;Walker - 2 wheels;Bedside commode      Prior Function Level of Independence: Independent with assistive device(s)         Comments: walks with cane hasn't been driving, pt doesn't cook nephew and family bring meals     Hand Dominance        Extremity/Trunk Assessment   Upper Extremity Assessment Upper Extremity Assessment: Generalized weakness    Lower Extremity Assessment Lower Extremity Assessment: Generalized weakness    Cervical / Trunk Assessment Cervical / Trunk Assessment: Other exceptions Cervical / Trunk Exceptions: increased sacral shelf  Communication   Communication: No difficulties  Cognition Arousal/Alertness: Awake/alert Behavior During Therapy: WFL for tasks assessed/performed Overall Cognitive Status: Within Functional Limits for tasks assessed                                        General Comments  Exercises     Assessment/Plan    PT Assessment Patent does not need any further PT services  PT Problem List         PT Treatment Interventions      PT Goals (Current goals can be found in the Care Plan section)  Acute Rehab PT Goals PT Goal Formulation: All assessment and education complete, DC therapy    Frequency     Barriers to discharge        Co-evaluation               AM-PAC PT "6 Clicks" Mobility  Outcome Measure Help needed turning from your back to your side while in a flat bed without using bedrails?: A Little Help needed moving from lying on your back  to sitting on the side of a flat bed without using bedrails?: A Little Help needed moving to and from a bed to a chair (including a wheelchair)?: None Help needed standing up from a chair using your arms (e.g., wheelchair or bedside chair)?: None Help needed to walk in hospital room?: A Little Help needed climbing 3-5 steps with a railing? : A Little 6 Click Score: 20    End of Session Equipment Utilized During Treatment: Gait belt Activity Tolerance: Patient tolerated treatment well Patient left: in chair;with call bell/phone within reach Nurse Communication: Mobility status PT Visit Diagnosis: Other abnormalities of gait and mobility (R26.89)    Time: 1009-1040 PT Time Calculation (min) (ACUTE ONLY): 31 min   Charges:   PT Evaluation $PT Eval Moderate Complexity: 1 Mod PT Treatments $Gait Training: 8-22 mins        Skyley Grandmaison P, PT Acute Rehabilitation Services Pager: [phone] Office: [place] 01/10/2021, 10:43 AM

## 2021-01-11 LAB — CBC
HCT: 34.3 % — ABNORMAL LOW (ref 36.0–46.0)
Hemoglobin: 10.7 g/dL — ABNORMAL LOW (ref 12.0–15.0)
MCH: 28.5 pg (ref 26.0–34.0)
MCHC: 31.2 g/dL (ref 30.0–36.0)
MCV: 91.5 fL (ref 80.0–100.0)
Platelets: 181 10*3/uL (ref 150–400)
RBC: 3.75 MIL/uL — ABNORMAL LOW (ref 3.87–5.11)
RDW: 16.1 % — ABNORMAL HIGH (ref 11.5–15.5)
WBC: 5.9 10*3/uL (ref 4.0–10.5)
nRBC: 0 % (ref 0.0–0.2)

## 2021-01-11 LAB — PROTIME-INR
INR: 1.9 — ABNORMAL HIGH (ref 0.8–1.2)
Prothrombin Time: 21.3 seconds — ABNORMAL HIGH (ref 11.4–15.2)

## 2021-01-11 LAB — HEPARIN LEVEL (UNFRACTIONATED): Heparin Unfractionated: 0.46 IU/mL (ref 0.30–0.70)

## 2021-01-11 MED ORDER — WARFARIN SODIUM 2.5 MG PO TABS
2.5000 mg | ORAL_TABLET | Freq: Once | ORAL | Status: AC
  Administered 2021-01-11: 2.5 mg via ORAL
  Filled 2021-01-11: qty 1

## 2021-01-11 NOTE — Progress Notes (Signed)
   01/11/21 1118  Assess: MEWS Score  Temp 98.1 F (36.7 C)  BP 98/61  Pulse Rate 63  ECG Heart Rate 63  Resp 13  Level of Consciousness Alert  SpO2 96 %  O2 Device Room Air  Assess: MEWS Score  MEWS Temp 0  MEWS Systolic 1  MEWS Pulse 0  MEWS RR 1  MEWS LOC 0  MEWS Score 2  MEWS Score Color Yellow  Assess: if the MEWS score is Yellow or Red  Were vital signs taken at a resting state? Yes  Focused Assessment No change from prior assessment  Early Detection of Sepsis Score *See Row Information* Low  MEWS guidelines implemented *See Row Information* Yes  Treat  MEWS Interventions Escalated (See documentation below)  Pain Scale 0-10  Pain Score 0  Take Vital Signs  Increase Vital Sign Frequency  Yellow: Q 2hr X 2 then Q 4hr X 2, if remains yellow, continue Q 4hrs  Escalate  MEWS: Escalate Yellow: discuss with charge nurse/RN and consider discussing with provider and RRT  Notify: Charge Nurse/RN  Name of Charge Nurse/RN Notified Marita [place]  Date Charge Nurse/RN Notified 01/11/21  Time Charge Nurse/RN Notified 1200  Document  Patient Outcome Other (Comment) (no interventions)  Progress note created (see row info) Yes

## 2021-01-11 NOTE — Progress Notes (Signed)
Subjective:  Patient denies any chest pain or shortness of breath.  Leg swelling and erythema  improved.  INR almost therapeutic range patient states she do not have a ride to go home today.  Her friend will come  tomorrow to pick her up    Objective:  Vital Signs in the last 24 hours: Temp:  [97.6 F (36.4 C)-98.2 F (36.8 C)] 98.1 F (36.7 C) (06/07 1118) Pulse Rate:  [59-66] 63 (06/07 1118) Resp:  [11-16] 13 (06/07 1118) BP: (92-105)/(55-82) 98/61 (06/07 1118) SpO2:  [96 %-99 %] 96 % (06/07 1118)  Intake/Output from previous day: 06/06 0701 - 06/07 0700 In: 300 [P.O.:120; I.V.:180] Out: 150 [Urine:150] Intake/Output from this shift: No intake/output data recorded.  Physical Exam: Neck: no adenopathy, no carotid bruit, no JVD and supple, symmetrical, trachea midline Lungs: clear to auscultation bilaterally Heart: regular rate and rhythm, S1, S2 normal and Soft systolic murmur and paradoxical splitting of second heart sound noted Abdomen: soft, non-tender; bowel sounds normal; no masses,  no organomegaly Extremities: No clubbing cyanosis 2+ edema with mild erythema noted  Lab Results: Recent Labs    01/10/21 0132 01/11/21 0110  WBC 6.9 5.9  HGB 10.8* 10.7*  PLT 190 181   Recent Labs    01/08/21 1443 01/10/21 0132  NA 141 138  K 3.4* 4.0  CL 107 107  CO2 24 20*  GLUCOSE 97 97  BUN 15 13  CREATININE 1.18* 1.43*   No results for input(s): TROPONINI in the last 72 hours.  Invalid input(s): CK, MB Hepatic Function Panel Recent Labs    01/08/21 1443  PROT 6.3*  ALBUMIN 3.0*  AST 25  ALT 13  ALKPHOS 109  BILITOT 1.1  BILIDIR 0.2  IBILI 0.9   Recent Labs    01/09/21 0053  CHOL 213*   No results for input(s): PROTIME in the last 72 hours.  Imaging: Imaging results have been reviewed and No results found.  Cardiac Studies:  Assessment/Plan:  Resolving cellulitis of the right leg rule out DVT Chronic leg edema Compensated diastolic congestive  heart failure Hypertension Hyperlipidemia Morbid obesity History of complete heart block status post permanent pacemaker History of paroxysmal A. fib CHA2DS2-VASc score of 6 on chronic anticoagulation Subtherapeutic INR Emphysema Degenerative joint disease Acute kidney injury secondary to hypotension Plan Continue present management Possible discharge home tomorrow  LOS: 3 days    Charolette Forward 01/11/2021, 12:02 PM

## 2021-01-11 NOTE — Progress Notes (Signed)
[place] for heparin + warfarin Indication: atrial fibrillation  Allergies  Allergen Reactions  . Albuterol Palpitations and Other (See Comments)    irregular heartbeat    Patient Measurements: Height: 5' 7.5" (171.5 cm) Weight: 104 kg (229 lb 4.5 oz) IBW/kg (Calculated) : 62.75 Heparin Dosing Weight: 86.4kg  Vital Signs: Temp: 98.1 F (36.7 C) (06/07 1118) Temp Source: Oral (06/07 1118) BP: 97/61 (06/07 0752) Pulse Rate: 63 (06/07 1118)  Labs: Recent Labs    01/08/21 1443 01/08/21 1443 01/08/21 1542 01/09/21 0053 01/09/21 0952 01/10/21 0132 01/10/21 0426 01/11/21 0110  HGB 11.2*  --   --  11.0*  --  10.8*  --  10.7*  HCT 36.2  --   --  35.8*  --  34.1*  --  34.3*  PLT 248  --   --  236  --  190  --  181  LABPROT 15.7*  --   --  16.8*  --   --  18.4* 21.3*  INR 1.2  --   --  1.4*  --   --  1.5* 1.9*  HEPARINUNFRC  --    < >  --  0.94* 0.71*  --  0.61 0.46  CREATININE 1.18*  --   --   --   --  1.43*  --   --   TROPONINIHS 10  --  10  --   --   --   --   --    < > = values in this interval not displayed.    Estimated Creatinine Clearance: 38 mL/min (A) (by C-G formula based on SCr of 1.43 mg/dL (H)).   Medical History: Past Medical History:  Diagnosis Date  . Allergic rhinitis   . Anemia    hx due to med  . Arthritis   . Asthma   . CHF (congestive heart failure) ([place])   . Dyspnea   . Emphysema of lung ([place])   . GERD (gastroesophageal reflux disease)   . Hyperlipidemia   . Hypertension   . Pacemaker   . PAT (paroxysmal atrial tachycardia) ([place]) 2008  . Pneumonia    hx  . 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  Chronic anemia stable, plts 248.    INR remains subtherapeutic at 1.9 however trend is significant over past 3 days from 1.4 > 1.5 > 1.9. Heparin level at goal, on  drip rate 900 units/hr. CBC stable. No overt bleeding or infusion issues noted.  PTA dosing: 2.5 mg daily  Goal of Therapy:  INR 2-3 Heparin level 0.3-0.7 units/ml Monitor platelets by anticoagulation protocol: Yes   Plan:  Warfarin 2.5 mg PO x1 today Continue heparin infusion at 900 units/hr Daily HL, INR, CBC, s/sx bleeding  Joetta Manners, PharmD, BCCCP Emergency Medicine Clinical Pharmacist  Please check AMION for all [place] phone numbers After 10:00 PM, call [place] [phone]

## 2021-01-11 NOTE — Plan of Care (Signed)
  Problem: Education: Goal: Knowledge of General Education information will improve Description: Including pain rating scale, medication(s)/side effects and non-pharmacologic comfort measures Outcome: Progressing   Problem: Activity: Goal: Risk for activity intolerance will decrease Outcome: Progressing   Problem: Nutrition: Goal: Adequate nutrition will be maintained Outcome: Progressing   Problem: Coping: Goal: Level of anxiety will decrease Outcome: Progressing   

## 2021-01-11 NOTE — Care Management Important Message (Signed)
Important Message  Patient Details  Name: [name] MRN: [id number] Date of Birth: November 16, 1940   Medicare Important Message Given:  Yes     Orbie Pyo 01/11/2021, 2:22 PM

## 2021-01-11 NOTE — Plan of Care (Signed)

## 2021-01-12 LAB — CBC
HCT: 32.1 % — ABNORMAL LOW (ref 36.0–46.0)
Hemoglobin: 10 g/dL — ABNORMAL LOW (ref 12.0–15.0)
MCH: 28.7 pg (ref 26.0–34.0)
MCHC: 31.2 g/dL (ref 30.0–36.0)
MCV: 92.2 fL (ref 80.0–100.0)
Platelets: 199 10*3/uL (ref 150–400)
RBC: 3.48 MIL/uL — ABNORMAL LOW (ref 3.87–5.11)
RDW: 16.4 % — ABNORMAL HIGH (ref 11.5–15.5)
WBC: 7.3 10*3/uL (ref 4.0–10.5)
nRBC: 0 % (ref 0.0–0.2)

## 2021-01-12 LAB — PROTIME-INR
INR: 2.6 — ABNORMAL HIGH (ref 0.8–1.2)
Prothrombin Time: 27.4 seconds — ABNORMAL HIGH (ref 11.4–15.2)

## 2021-01-12 LAB — HEPARIN LEVEL (UNFRACTIONATED): Heparin Unfractionated: 0.61 IU/mL (ref 0.30–0.70)

## 2021-01-12 MED ORDER — CEPHALEXIN 500 MG PO CAPS
500.0000 mg | ORAL_CAPSULE | Freq: Three times a day (TID) | ORAL | Status: DC
  Administered 2021-01-12: 500 mg via ORAL
  Filled 2021-01-12 (×3): qty 1

## 2021-01-12 MED ORDER — CEPHALEXIN 500 MG PO CAPS: 500.0000 mg | ORAL_CAPSULE | Freq: Three times a day (TID) | ORAL | 0 refills | Status: DC

## 2021-01-12 MED ORDER — TORSEMIDE 10 MG PO TABS: 10.0000 mg | ORAL_TABLET | ORAL | 3 refills | Status: DC

## 2021-01-12 MED ORDER — POTASSIUM CHLORIDE CRYS ER 10 MEQ PO TBCR: 10.0000 meq | EXTENDED_RELEASE_TABLET | Freq: Every day | ORAL | 3 refills | Status: DC

## 2021-01-12 MED ORDER — METOPROLOL TARTRATE 25 MG PO TABS: 12.5000 mg | ORAL_TABLET | Freq: Two times a day (BID) | ORAL | 3 refills | Status: DC

## 2021-01-12 NOTE — Progress Notes (Signed)
PT Cancellation Note  Patient Details Name: [name] MRN: [id number] DOB: 16-Oct-1940   Cancelled Treatment:    Reason Eval/Treat Not Completed: PT screened, no needs identified, will sign off Pt previously seen on 6/6 and PT signed off. RN reports no change in mobility and no need for skilled PT. Will sign off. If needs change, please re-consult.   Lou Miner, DPT  Acute Rehabilitation Services  Pager: [phone] Office: [phone]    Rudean Hitt 01/12/2021, 9:51 AM

## 2021-01-12 NOTE — Discharge Summary (Signed)
Physician Discharge Summary  Patient ID: [name] MRN: [id number] DOB/AGE: 13-Jun-1941 80 y.o.  Admit date: 01/08/2021 Discharge date: 01/12/2021  Admission Diagnoses: Cellulitis of right leg r/o DVT Chronic leg edema Compensated diastolic congestive heart failure Hypertension Hyperlipidemia Morbid obesity H/O complete heart block S/P permanent pacemaker  H/O paroxysmal atrial fibrillation Chronic warfarin use Subtherapeutic INR Emphysema Degenerative joint disease  Discharge Diagnoses:  Principle Problem: * Cellulitis of right lower leg * Active problems: Chronic lymphedema of both legs Compensated diastolic left heart failure HTN Hyperlipidemia Morbid obesity H/O complete heart block S/P permanent pacemaker H/O paroxysmal atrial fibrillation Chronic warfarin use Emphysema Degenerative joint disease CKD, IIIb Anemia of chronic disease  Discharged Condition: stable  Hospital Course: 80 years old black female with PMH of HTN, HLD, H/O complete heart block, s/p permanent pacemaker, paroxysmal atrial fibrillation, Chronic warfarin use, emphysema and CKD stage II  Had shortness of breath and leg edema. She had mild erythema of right leg. She denied fever or chills. EKG in ED showed atrial sensed ventricular paced rhythm. Her high sensitivity troponin I and BNP levels were normal. She had moderate response with IV ancef use and changing lasix to torsemide.  She was advised to follow heart healthy diet and use support stocking for lower leg edema as tolerated.  Her subtherapeutic INR improved gradually. She will use oral antibiotics and see me in 1 week and Dr. Lovena Le, EP physician as arranged.  Consults: cardiology  Significant Diagnostic Studies: labs: Normal WBC, mild anemia with Hgb of 10 to 11 gm. And normal platelets level. BMET showed potassium of 3.4 on admission and 4.0 mmol post supplementation. Creatinine was 0.83 and worsened to 1.43 with diuresis.  EKG: atrial  sensed ventricular paced rhythm.  CXR: No acute abnormality  Lower Extremity US, bilateral: negative for DVT.  Treatments: antibiotics: Ancef followed by Keflex, cardiac meds: Torsemide, Potassium, metoprolol, Spironolactone and anticoagulation: heparin and warfarin  Discharge Exam: Blood pressure 115/63, pulse 60, temperature 97.7 F (36.5 C), temperature source Oral, resp. rate 19, height 5' 7.5" (1.715 m), weight 104 kg, SpO2 98 %. General appearance: alert, cooperative and appears stated age. Head: Normocephalic, atraumatic. Eyes: Brown eyes, Pale pink conjunctiva, corneas clear. Wears glasses.  Neck: No adenopathy, no carotid bruit, no JVD, supple, symmetrical, trachea midline and thyroid not enlarged. Resp: Clearing to auscultation bilaterally. Cardio: Regular rate and rhythm, S1, S2 normal, III/VI systolic murmur, no click, rub or gallop. GI: Soft, non-tender; bowel sounds normal; no organomegaly. Extremities: 1 + edema, no cyanosis or clubbing. Tenderness to touch both lower legs. Skin: Warm and dry.  Neurologic: Alert and oriented X 3, normal strength and tone. Normal coordination and slow gait with walker.  Disposition: Discharge disposition: 01-Home or Self Care        Allergies as of 01/12/2021      Reactions   Albuterol Palpitations, Other (See Comments)   irregular heartbeat      Medication List    STOP taking these medications   furosemide 40 MG tablet Commonly known as: LASIX     TAKE these medications   acetaminophen 325 MG tablet Commonly known as: TYLENOL Take 2 tablets (650 mg total) by mouth every 4 (four) hours as needed for headache or mild pain.   atorvastatin 20 MG tablet Commonly known as: LIPITOR Take 20 mg by mouth daily at 6 PM.   budesonide-formoterol 80-4.5 MCG/ACT inhaler Commonly known as: Symbicort Inhale 2 puffs into the lungs 2 (two) times daily.  cephALEXin 500 MG capsule Commonly known as: KEFLEX Take 1 capsule (500 mg  total) by mouth 3 (three) times daily.   levalbuterol 45 MCG/ACT inhaler Commonly known as: Xopenex HFA Inhale 2 puffs into the lungs every 4 (four) hours as needed for wheezing.   metoprolol tartrate 25 MG tablet Commonly known as: LOPRESSOR Take 0.5 tablets (12.5 mg total) by mouth 2 (two) times daily.   montelukast 10 MG tablet Commonly known as: SINGULAIR TAKE 1 TABLET BY MOUTH AT BEDTIME EVERY NIGHT What changed:   how much to take  how to take this  when to take this  additional instructions   omeprazole 20 MG capsule Commonly known as: PRILOSEC Take 1 capsule (20 mg total) by mouth daily. Take 30-60 min before first meal of the day   potassium chloride 10 MEQ tablet Commonly known as: KLOR-CON Take 1 tablet (10 mEq total) by mouth daily.   spironolactone 25 MG tablet Commonly known as: ALDACTONE Take 0.5 tablets (12.5 mg total) by mouth daily.   torsemide 10 MG tablet Commonly known as: DEMADEX Take 1 tablet (10 mg total) by mouth every Monday, Wednesday, and Friday.   warfarin 5 MG tablet Commonly known as: COUMADIN Take 0.5 tablets (2.5 mg total) by mouth daily at 4 PM.       Follow-up Information    Evans Lance, MD .   Specialty: Cardiology Contact information: [address] [place] [address] [phone]        Dixie Dials, MD. Schedule an appointment as soon as possible for a visit in 1 week(s).   Specialty: Cardiology Contact information: [place] [address] [phone]               Time spent: Review of old chart, current chart, lab, x-ray, cardiac tests and discussion with patient over 60 minutes.  Signed: Birdie Riddle 01/12/2021, 9:03 AM

## 2021-01-13 NOTE — Progress Notes (Deleted)
Electrophysiology Office Note Date: 01/13/2021  ID:  [name], DOB 08/26/1940, MRN [id number]  PCP: Charolette Forward, MD Primary Cardiologist: Cristopher Peru, MD Electrophysiologist: Cristopher Peru, MD   CC: Pacemaker follow-up  [name] is a 80 y.o. female seen today for Cristopher Peru, MD for post hospital follow up.    Patient admitted to [facility] 6/4 - 6/8 with SOB and leg edema. Had mild erythmema on R leg. Treated with IV Ancef for cellulitis and torsemide for A/C CHF.   Since discharge from hospital the patient reports doing ***.  she denies chest pain, palpitations, dyspnea, PND, orthopnea, nausea, vomiting, dizziness, syncope, edema, weight gain, or early satiety.  Device History: St. 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   rxd 3 yrs meds, no surgery in kidneys   Past Surgical History:  Procedure Laterality Date   ABDOMINAL HYSTERECTOMY     BREAST BIOPSY  04/30/2012   Procedure: BREAST BIOPSY WITH NEEDLE LOCALIZATION;  Surgeon: Rolm Bookbinder, MD;  Location: [facility];  Service: General;  Laterality: Left;  Left breast wire localization biospy   HEMORRHOID SURGERY     PACEMAKER INSERTION  02-06-2011   PPM GENERATOR CHANGEOUT N/A 04/19/2020   Procedure: PPM GENERATOR CHANGEOUT;  Surgeon: Evans Lance, MD;  Location: [facility];  Service: Cardiovascular;  Laterality: N/A;   TAH and BSO     TONSILLECTOMY      Current Outpatient Medications  Medication Sig Dispense Refill   acetaminophen (TYLENOL) 325 MG tablet Take 2 tablets (650 mg total) by mouth every 4  (four) hours as needed for headache or mild pain.     atorvastatin (LIPITOR) 20 MG tablet Take 20 mg by mouth daily at 6 PM.      budesonide-formoterol (SYMBICORT) 80-4.5 MCG/ACT inhaler Inhale 2 puffs into the lungs 2 (two) times daily. 3 Inhaler 1   cephALEXin (KEFLEX) 500 MG capsule Take 1 capsule (500 mg total) by mouth 3 (three) times daily. 21 capsule 0   levalbuterol (XOPENEX HFA) 45 MCG/ACT inhaler Inhale 2 puffs into the lungs every 4 (four) hours as needed for wheezing. 3 Inhaler 1   metoprolol tartrate (LOPRESSOR) 25 MG tablet Take 0.5 tablets (12.5 mg total) by mouth 2 (two) times daily. 60 tablet 3   montelukast (SINGULAIR) 10 MG tablet TAKE 1 TABLET BY MOUTH AT BEDTIME EVERY NIGHT (Patient taking differently: Take 10 mg by mouth at bedtime.) 30 tablet 2   omeprazole (PRILOSEC) 20 MG capsule Take 1 capsule (20 mg total) by mouth daily. Take 30-60 min before first meal of the day 30 capsule 5   potassium chloride SA (KLOR-CON) 10 MEQ tablet Take 1 tablet (10 mEq total) by mouth daily. 30 tablet 3   spironolactone (ALDACTONE) 25 MG tablet Take 0.5 tablets (12.5 mg total) by mouth daily.     torsemide (DEMADEX) 10 MG tablet Take 1 tablet (10 mg total) by mouth  every Monday, Wednesday, and Friday. 30 tablet 3   warfarin (COUMADIN) 5 MG tablet Take 0.5 tablets (2.5 mg total) by mouth daily at 4 PM.     No current facility-administered medications for this visit.    Allergies:   Albuterol   Social History: Social History   Socioeconomic History   Marital status: Divorced    Spouse name: Not on file   Number of children: 2   Years of education: Not on file   Highest education level: Not on file  Occupational History   Occupation: cna  Tobacco Use   Smoking status: Former    Packs/day: 1.00    Years: 4.00    Pack years: 4.00    Types: Cigarettes    Quit date: 08/07/1977    Years since quitting: 43.4   Smokeless tobacco: Never  Vaping Use   Vaping Use: Never used  Substance  and Sexual Activity   Alcohol use: No    Alcohol/week: 0.0 standard drinks    Comment: quit 1979   Drug use: No   Sexual activity: Not on file  Other Topics Concern   Not on file  Social History Narrative   Pt has 11 siblings in all   Social Determinants of Health   Financial Resource Strain: Not on file  Food Insecurity: No Food Insecurity   Worried About Charity fundraiser in the Last Year: Never true   [place] in the Last Year: Never true  Transportation Needs: Unknown   Lack of Transportation (Medical): No   Lack of Transportation (Non-Medical): Not on file  Physical Activity: Not on file  Stress: Not on file  Social Connections: Not on file  Intimate Partner Violence: Not on file    Family History: Family History  Problem Relation Age of Onset   Heart disease Mother    Cancer Sister        breast     Review of Systems: All other systems reviewed and are otherwise negative except as noted above.  Physical Exam: There were no vitals filed for this visit.   GEN- The patient is well appearing, alert and oriented x 3 today.   HEENT: normocephalic, atraumatic; sclera clear, conjunctiva pink; hearing intact; oropharynx clear; neck supple  Lungs- Clear to ausculation bilaterally, normal work of breathing.  No wheezes, rales, rhonchi Heart- Regular rate and rhythm, no murmurs, rubs or gallops  GI- soft, non-tender, non-distended, bowel sounds present  Extremities- no clubbing or cyanosis. No edema MS- no significant deformity or atrophy Skin- warm and dry, no rash or lesion; PPM pocket well healed Psych- euthymic mood, full affect Neuro- strength and sensation are intact  PPM Interrogation- reviewed in detail today,  See PACEART report  EKG:  EKG is not ordered today. The ekg ordered *** shows ***  Recent Labs: 01/08/2021: ALT 13; B Natriuretic Peptide 26.7 01/10/2021: BUN 13; Creatinine, Ser 1.43; Magnesium 1.7; Potassium 4.0; Sodium 138 01/12/2021:  Hemoglobin 10.0; Platelets 199   Wt Readings from Last 3 Encounters:  01/08/21 229 lb 4.5 oz (104 kg)  11/22/20 235 lb (106.6 kg)  07/23/20 235 lb 12.8 oz (107 kg)     Other studies Reviewed: Additional studies/ records that were reviewed today include: Previous EP office notes, Previous remote checks, Most recent labwork.   Assessment and Plan:  1. CHB s/p St. Jude PPM  Normal PPM function See Pace Art report No changes today  2. Chronic diastolic CHF Volume status *** after  recent admission Echo 12/2019 LVEF 50-55% She sees Dr. Doylene Canard next week.   3. Obesity There is no height or weight on file to calculate BMI.   4. HTN Continue current regimen  Current medicines are reviewed at length with the patient today.   The patient does not have concerns regarding her medicines.  The following changes were made today:  {NONE DEFAULTED:18576}  Labs/ tests ordered today include: *** No orders of the defined types were placed in this encounter.    Disposition:   Follow up with {Blank single:19197::"Dr. Allred","Dr. Arlan Organ. Klein","Dr. Camnitz","Dr. Lambert","EP APP"} in *** {Blank single:19197::"Months","Weeks"}    Signed, Shirley Friar, PA-C  01/13/2021 9:01 PM  [place]  [address] [phone] (office) [phone] (fax)

## 2021-01-14 ENCOUNTER — Encounter: Payer: Medicare HMO | Admitting: Student

## 2021-01-14 ENCOUNTER — Other Ambulatory Visit: Payer: Self-pay

## 2021-01-14 NOTE — Patient Outreach (Signed)
[facility]) Care Management  01/14/2021  Tejasvi Brissett 07/28/1941 [id number]   Telephone Assessment Transition of Care    Outreach attempt # 1 to patient. Spoke with patient who reported she was in the bed resting and not up to talking right now.       Plan: RN CM will make outreach attempt to patient within 3-4 business days.   [name], RN,BSN,CCM [facility] Management Telephonic Care Management Coordinator Direct Phone: [phone] Toll Free: [phone] Fax: [phone]

## 2021-01-17 ENCOUNTER — Ambulatory Visit (INDEPENDENT_AMBULATORY_CARE_PROVIDER_SITE_OTHER): Payer: Medicare HMO

## 2021-01-17 DIAGNOSIS — I495 Sick sinus syndrome: Secondary | ICD-10-CM | POA: Diagnosis not present

## 2021-01-18 ENCOUNTER — Other Ambulatory Visit: Payer: Self-pay

## 2021-01-18 NOTE — Patient Outreach (Addendum)
[facility]) Care Management  01/18/2021  [name] 09-Sep-1940 [id number]   Telephone Assessment     EMMI-General Discharge RED ON EMMI ALERT Day # 1 Date: 01/14/2021 Red Alert Reason: "Scheduled a follow up appt? No"   Outreach attempt #1 to patient. Spoke with patient who denies any acute issues or concerns at present. Patient confirms that she has appt on 02/15/21 to establish care with PCP. She also needs to call and make cardiac follow up appt and states she will do so today. No transportation issues reported.    Plan: RN CM discussed with patient next outreach within the month of July. Patient gave verbal consent and in agreement with RN CM follow up and timeframe. Patient aware that they may contact RN CM sooner for any issues or concerns. RN CM reviewed goals and plan of care with patient. Patient agrees to care plan and follow up.  [name], RN,BSN,CCM [place] Management Telephonic Care Management Coordinator Direct Phone: [phone] Toll Free: [phone] Fax: [phone]      Plan:  [name], RN,BSN,CCM [facility] Management Telephonic Care Management Coordinator Direct Phone: [phone] Toll Free: [phone] Fax: [phone]

## 2021-01-20 LAB — CUP PACEART REMOTE DEVICE CHECK
Battery Remaining Longevity: 107 mo
Battery Remaining Percentage: 95.5 %
Battery Voltage: 3.01 V
Brady Statistic AP VP Percent: 23 %
Brady Statistic AP VS Percent: 1 %
Brady Statistic AS VP Percent: 77 %
Brady Statistic AS VS Percent: 1 %
Brady Statistic RA Percent Paced: 22 %
Brady Statistic RV Percent Paced: 99 %
Date Time Interrogation Session: 20220613020013
Implantable Lead Implant Date: 20120702
Implantable Lead Implant Date: 20120702
Implantable Lead Location: 753859
Implantable Lead Location: 753860
Implantable Pulse Generator Implant Date: 20210913
Lead Channel Impedance Value: 360 Ohm
Lead Channel Impedance Value: 380 Ohm
Lead Channel Pacing Threshold Amplitude: 0.875 V
Lead Channel Pacing Threshold Amplitude: 1.25 V
Lead Channel Pacing Threshold Pulse Width: 0.4 ms
Lead Channel Pacing Threshold Pulse Width: 0.5 ms
Lead Channel Sensing Intrinsic Amplitude: 4.1 mV
Lead Channel Setting Pacing Amplitude: 1.5 V
Lead Channel Setting Pacing Amplitude: 1.875
Lead Channel Setting Pacing Pulse Width: 0.5 ms
Lead Channel Setting Sensing Sensitivity: 4 mV
Pulse Gen Model: 2272
Pulse Gen Serial Number: 3864080

## 2021-01-26 ENCOUNTER — Ambulatory Visit: Payer: Self-pay

## 2021-01-28 DIAGNOSIS — I509 Heart failure, unspecified: Secondary | ICD-10-CM | POA: Diagnosis not present

## 2021-02-08 NOTE — Progress Notes (Signed)
Remote pacemaker transmission.   

## 2021-02-14 ENCOUNTER — Other Ambulatory Visit: Payer: Self-pay

## 2021-02-15 ENCOUNTER — Ambulatory Visit (INDEPENDENT_AMBULATORY_CARE_PROVIDER_SITE_OTHER): Payer: Medicare HMO | Admitting: Internal Medicine

## 2021-02-15 ENCOUNTER — Encounter: Payer: Self-pay | Admitting: Internal Medicine

## 2021-02-15 VITALS — BP 130/80 | HR 84 | Temp 97.6°F | Ht 68.0 in | Wt 223.1 lb

## 2021-02-15 DIAGNOSIS — I480 Paroxysmal atrial fibrillation: Secondary | ICD-10-CM | POA: Diagnosis not present

## 2021-02-15 DIAGNOSIS — I5032 Chronic diastolic (congestive) heart failure: Secondary | ICD-10-CM | POA: Diagnosis not present

## 2021-02-15 DIAGNOSIS — I10 Essential (primary) hypertension: Secondary | ICD-10-CM | POA: Diagnosis not present

## 2021-02-15 DIAGNOSIS — Z7901 Long term (current) use of anticoagulants: Secondary | ICD-10-CM

## 2021-02-15 DIAGNOSIS — Z950 Presence of cardiac pacemaker: Secondary | ICD-10-CM

## 2021-02-15 DIAGNOSIS — I442 Atrioventricular block, complete: Secondary | ICD-10-CM

## 2021-02-15 DIAGNOSIS — N1832 Chronic kidney disease, stage 3b: Secondary | ICD-10-CM | POA: Diagnosis not present

## 2021-02-15 DIAGNOSIS — E782 Mixed hyperlipidemia: Secondary | ICD-10-CM | POA: Diagnosis not present

## 2021-02-15 NOTE — Patient Instructions (Signed)
-  Nice seeing you today!!  -schedule follow up in 3 months for your physical. Please come in fasting that day.  -[facility] services will be arranged.

## 2021-02-15 NOTE — Progress Notes (Signed)
New Patient Office Visit     This visit occurred during the SARS-CoV-2 public health emergency.  Safety protocols were in place, including screening questions prior to the visit, additional usage of staff PPE, and extensive cleaning of exam room while observing appropriate contact time as indicated for disinfecting solutions.    CC/Reason for Visit: Establish care, discuss chronic medical conditions Previous PCP: Dixie Dials Last Visit: Unknown  HPI: [name] is a 80 y.o. female who is coming in today for the above mentioned reasons.  She has a complex medical history.  To the best of my knowledge by collection from patient and through chart review her history is significant for hypertension, hyperlipidemia, morbid obesity, complete heart block status post permanent pacemaker followed by Dr. Lovena Le, heart failure with preserved ejection fraction, atrial fibrillation chronically anticoagulated on warfarin, she also has COPD/emphysema and bilateral lymphedema.  She gets Meals on Wheels daily.  She is here today with her goddaughter and caregiver.  She is mostly independent.  She still lives alone.  She is interested in attending the Coumadin clinic here.  She is requesting a handicap placard.  They are requesting home health services.  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   rxd 3 yrs meds, no surgery in kidneys    Past Surgical History:  Procedure Laterality Date   ABDOMINAL HYSTERECTOMY     BREAST BIOPSY  04/30/2012   Procedure: BREAST BIOPSY WITH NEEDLE LOCALIZATION;  Surgeon: Rolm Bookbinder, MD;  Location: [facility];  Service: General;  Laterality: Left;  Left breast wire localization biospy   HEMORRHOID SURGERY     PACEMAKER INSERTION  02-06-2011   PPM GENERATOR CHANGEOUT N/A 04/19/2020   Procedure: PPM GENERATOR CHANGEOUT;  Surgeon: Evans Lance, MD;  Location: [facility];  Service: Cardiovascular;  Laterality: N/A;   TAH and BSO     TONSILLECTOMY      Social History:  reports that she quit smoking about 43 years ago. Her smoking use included cigarettes. She has a 4.00 pack-year smoking history. She has never used smokeless tobacco. She reports that she does not drink alcohol and does not use drugs.  Allergies: Allergies  Allergen Reactions   Albuterol Palpitations and Other (See Comments)    irregular heartbeat    Family History:  Family History  Problem Relation Age of Onset   Heart disease Mother    Cancer Sister        breast     Current Outpatient Medications:    atorvastatin (LIPITOR) 20 MG tablet, Take 20 mg by mouth daily at 6 PM. , Disp: , Rfl:    budesonide-formoterol (SYMBICORT) 80-4.5 MCG/ACT inhaler, Inhale 2 puffs into the lungs 2 (two) times daily., Disp: 3 Inhaler, Rfl: 1   levalbuterol (XOPENEX HFA) 45 MCG/ACT inhaler, Inhale 2 puffs into the lungs every 4 (four) hours as needed for wheezing., Disp: 3 Inhaler, Rfl: 1   metoprolol tartrate (LOPRESSOR) 25 MG tablet, Take 0.5 tablets (  12.5 mg total) by mouth 2 (two) times daily., Disp: 60 tablet, Rfl: 3   montelukast (SINGULAIR) 10 MG tablet, TAKE 1 TABLET BY MOUTH AT BEDTIME EVERY NIGHT (Patient taking differently: Take 10 mg by mouth at bedtime.), Disp: 30 tablet, Rfl: 2   omeprazole (PRILOSEC) 20 MG capsule, Take 1 capsule (20 mg total) by mouth daily. Take 30-60 min before first meal of the day, Disp: 30 capsule, Rfl: 5   spironolactone (ALDACTONE) 25 MG tablet, Take 0.5 tablets (12.5 mg total) by mouth daily., Disp: , Rfl:    torsemide (DEMADEX) 10 MG tablet, Take 1 tablet (10 mg total) by mouth every Monday,  Wednesday, and Friday. (Patient taking differently: Take 10 mg by mouth daily.), Disp: 30 tablet, Rfl: 3   valsartan (DIOVAN) 160 MG tablet, Take 160 mg by mouth daily., Disp: , Rfl:    warfarin (COUMADIN) 5 MG tablet, Take 0.5 tablets (2.5 mg total) by mouth daily at 4 PM., Disp: , Rfl:    potassium chloride SA (KLOR-CON) 10 MEQ tablet, Take 1 tablet (10 mEq total) by mouth daily. (Patient not taking: Reported on 02/15/2021), Disp: 30 tablet, Rfl: 3  Review of Systems:  Constitutional: Denies fever, chills, diaphoresis, appetite change and fatigue.  HEENT: Denies photophobia, eye pain, redness, hearing loss, ear pain, congestion, sore throat, rhinorrhea, sneezing, mouth sores, trouble swallowing, neck pain, neck stiffness and tinnitus.   Respiratory: Denies SOB, DOE, cough, chest tightness,  and wheezing.   Cardiovascular: Denies chest pain, palpitations. Gastrointestinal: Denies nausea, vomiting, abdominal pain, diarrhea, constipation, blood in stool and abdominal distention.  Genitourinary: Denies dysuria, urgency, frequency, hematuria, flank pain and difficulty urinating.  Endocrine: Denies: hot or cold intolerance, sweats, changes in hair or nails, polyuria, polydipsia. Musculoskeletal: Denies myalgias, back pain, joint swelling, arthralgias and gait problem.  Skin: Denies pallor, rash and wound.  Neurological: Denies dizziness, seizures, syncope, weakness, light-headedness, numbness and headaches.  Hematological: Denies adenopathy. Easy bruising, personal or family bleeding history  Psychiatric/Behavioral: Denies suicidal ideation, mood changes, confusion, nervousness, sleep disturbance and agitation    Physical Exam: Vitals:   02/15/21 1502  BP: 130/80  Pulse: 84  Temp: 97.6 F (36.4 C)  TempSrc: Oral  SpO2: 98%  Weight: 223 lb 1.6 oz (101.2 kg)  Height: 5\' 8"  (1.727 m)   Body mass index is 33.92 kg/m.  Constitutional: NAD, calm, comfortable, obese, ambulates with a  cane Eyes: PERRL, lids and conjunctivae normal ENMT: Mucous membranes are moist.  Respiratory: clear to auscultation bilaterally, no wheezing, no crackles. Normal respiratory effort. No accessory muscle use.  Cardiovascular: Regular rate and rhythm, no murmurs / rubs / gallops.  3+ pitting edema bilaterally to lower legs.  Neurologic: Grossly intact and nonfocal. Psychiatric: Normal judgment and insight. Alert and oriented x 3. Normal mood.    Impression and Plan:  Paroxysmal atrial fibrillation (HCC) Chronic anticoagulation -She is rate controlled today. -She is interested in attending the [facility] Coumadin clinic.  Chronic heart failure with preserved ejection fraction (HCC) -Noted, continue beta-blocker, ARB, torsemide, statin.  Essential hypertension -Blood pressure is fairly well controlled.  Obesity, Class III, BMI 40-49.9 (morbid obesity) ([place]) -Discussed healthy lifestyle, including increased physical activity and better food choices to promote weight loss.  Mixed hyperlipidemia -Last lipid panel in June returning to work total cholesterol 213, triglycerides 32 and LDL 145. -She is on atorvastatin.  Stage 3b chronic kidney disease ([place]) -Last measured creatinine was 1.430 in June 2022.  Complete heart block (HCC) Pacemaker-St.Jude -Followed by  Dr. Lovena Le.    Patient Instructions  -Nice seeing you today!!  -schedule follow up in 3 months for your physical. Please come in fasting that day.  -[facility] services will be arranged.   Lelon Frohlich, MD [facility]

## 2021-02-18 ENCOUNTER — Other Ambulatory Visit: Payer: Self-pay

## 2021-02-18 NOTE — Patient Outreach (Signed)
[facility]) Care Management  02/18/2021  Cydne Grahn [birth year] [id number]   Telephone Assessment   Outreach attempt to patient. No answer after multiple rings and unable to leave voicemail message.     Plan: RN CM will make outreach attempt to patient within the month of Aug if no return call from patient.   [name], RN,BSN,CCM [place] [facility] Management Telephonic Care Management Coordinator Direct Phone: [phone] Toll Free: [phone] Fax: [phone]

## 2021-02-20 DIAGNOSIS — I130 Hypertensive heart and chronic kidney disease with heart failure and stage 1 through stage 4 chronic kidney disease, or unspecified chronic kidney disease: Secondary | ICD-10-CM | POA: Diagnosis not present

## 2021-02-20 DIAGNOSIS — I442 Atrioventricular block, complete: Secondary | ICD-10-CM | POA: Diagnosis not present

## 2021-02-20 DIAGNOSIS — J439 Emphysema, unspecified: Secondary | ICD-10-CM | POA: Diagnosis not present

## 2021-02-20 DIAGNOSIS — I5032 Chronic diastolic (congestive) heart failure: Secondary | ICD-10-CM | POA: Diagnosis not present

## 2021-02-20 DIAGNOSIS — I890 Lymphedema, not elsewhere classified: Secondary | ICD-10-CM | POA: Diagnosis not present

## 2021-02-20 DIAGNOSIS — D631 Anemia in chronic kidney disease: Secondary | ICD-10-CM | POA: Diagnosis not present

## 2021-02-20 DIAGNOSIS — N1832 Chronic kidney disease, stage 3b: Secondary | ICD-10-CM | POA: Diagnosis not present

## 2021-02-20 DIAGNOSIS — I480 Paroxysmal atrial fibrillation: Secondary | ICD-10-CM | POA: Diagnosis not present

## 2021-02-20 DIAGNOSIS — I479 Paroxysmal tachycardia, unspecified: Secondary | ICD-10-CM | POA: Diagnosis not present

## 2021-02-21 ENCOUNTER — Ambulatory Visit: Payer: Medicare HMO

## 2021-02-23 ENCOUNTER — Other Ambulatory Visit: Payer: Self-pay

## 2021-02-23 ENCOUNTER — Ambulatory Visit (INDEPENDENT_AMBULATORY_CARE_PROVIDER_SITE_OTHER): Payer: Medicare HMO | Admitting: General Practice

## 2021-02-23 DIAGNOSIS — I890 Lymphedema, not elsewhere classified: Secondary | ICD-10-CM | POA: Diagnosis not present

## 2021-02-23 DIAGNOSIS — I480 Paroxysmal atrial fibrillation: Secondary | ICD-10-CM | POA: Diagnosis not present

## 2021-02-23 DIAGNOSIS — I5032 Chronic diastolic (congestive) heart failure: Secondary | ICD-10-CM | POA: Diagnosis not present

## 2021-02-23 DIAGNOSIS — N1832 Chronic kidney disease, stage 3b: Secondary | ICD-10-CM | POA: Diagnosis not present

## 2021-02-23 DIAGNOSIS — I442 Atrioventricular block, complete: Secondary | ICD-10-CM | POA: Diagnosis not present

## 2021-02-23 DIAGNOSIS — D631 Anemia in chronic kidney disease: Secondary | ICD-10-CM | POA: Diagnosis not present

## 2021-02-23 DIAGNOSIS — J439 Emphysema, unspecified: Secondary | ICD-10-CM | POA: Diagnosis not present

## 2021-02-23 DIAGNOSIS — I4891 Unspecified atrial fibrillation: Secondary | ICD-10-CM

## 2021-02-23 DIAGNOSIS — I130 Hypertensive heart and chronic kidney disease with heart failure and stage 1 through stage 4 chronic kidney disease, or unspecified chronic kidney disease: Secondary | ICD-10-CM | POA: Diagnosis not present

## 2021-02-23 DIAGNOSIS — I479 Paroxysmal tachycardia, unspecified: Secondary | ICD-10-CM | POA: Diagnosis not present

## 2021-02-23 DIAGNOSIS — Z7901 Long term (current) use of anticoagulants: Secondary | ICD-10-CM

## 2021-02-23 LAB — POCT INR: INR: 2.2 (ref 2.0–3.0)

## 2021-02-23 NOTE — Patient Instructions (Addendum)
Pre visit review using our clinic review tool, if applicable. No additional management support is needed unless otherwise documented below in the visit note.  Please take 1/2 tablet (peach colored 5 mg tablet) every day.  Re-check in 2 weeks.

## 2021-02-25 DIAGNOSIS — I442 Atrioventricular block, complete: Secondary | ICD-10-CM | POA: Diagnosis not present

## 2021-02-25 DIAGNOSIS — I479 Paroxysmal tachycardia, unspecified: Secondary | ICD-10-CM | POA: Diagnosis not present

## 2021-02-25 DIAGNOSIS — I5032 Chronic diastolic (congestive) heart failure: Secondary | ICD-10-CM | POA: Diagnosis not present

## 2021-02-25 DIAGNOSIS — D631 Anemia in chronic kidney disease: Secondary | ICD-10-CM | POA: Diagnosis not present

## 2021-02-25 DIAGNOSIS — I890 Lymphedema, not elsewhere classified: Secondary | ICD-10-CM | POA: Diagnosis not present

## 2021-02-25 DIAGNOSIS — I130 Hypertensive heart and chronic kidney disease with heart failure and stage 1 through stage 4 chronic kidney disease, or unspecified chronic kidney disease: Secondary | ICD-10-CM | POA: Diagnosis not present

## 2021-02-25 DIAGNOSIS — J439 Emphysema, unspecified: Secondary | ICD-10-CM | POA: Diagnosis not present

## 2021-02-25 DIAGNOSIS — I480 Paroxysmal atrial fibrillation: Secondary | ICD-10-CM | POA: Diagnosis not present

## 2021-02-25 DIAGNOSIS — N1832 Chronic kidney disease, stage 3b: Secondary | ICD-10-CM | POA: Diagnosis not present

## 2021-03-04 DIAGNOSIS — I442 Atrioventricular block, complete: Secondary | ICD-10-CM | POA: Diagnosis not present

## 2021-03-04 DIAGNOSIS — N1832 Chronic kidney disease, stage 3b: Secondary | ICD-10-CM | POA: Diagnosis not present

## 2021-03-04 DIAGNOSIS — I890 Lymphedema, not elsewhere classified: Secondary | ICD-10-CM | POA: Diagnosis not present

## 2021-03-04 DIAGNOSIS — J439 Emphysema, unspecified: Secondary | ICD-10-CM | POA: Diagnosis not present

## 2021-03-04 DIAGNOSIS — I480 Paroxysmal atrial fibrillation: Secondary | ICD-10-CM | POA: Diagnosis not present

## 2021-03-04 DIAGNOSIS — I5032 Chronic diastolic (congestive) heart failure: Secondary | ICD-10-CM | POA: Diagnosis not present

## 2021-03-04 DIAGNOSIS — I479 Paroxysmal tachycardia, unspecified: Secondary | ICD-10-CM | POA: Diagnosis not present

## 2021-03-04 DIAGNOSIS — I130 Hypertensive heart and chronic kidney disease with heart failure and stage 1 through stage 4 chronic kidney disease, or unspecified chronic kidney disease: Secondary | ICD-10-CM | POA: Diagnosis not present

## 2021-03-04 DIAGNOSIS — D631 Anemia in chronic kidney disease: Secondary | ICD-10-CM | POA: Diagnosis not present

## 2021-03-09 ENCOUNTER — Telehealth: Payer: Self-pay

## 2021-03-09 ENCOUNTER — Ambulatory Visit: Payer: Medicare HMO

## 2021-03-09 DIAGNOSIS — I5032 Chronic diastolic (congestive) heart failure: Secondary | ICD-10-CM | POA: Diagnosis not present

## 2021-03-09 DIAGNOSIS — N1832 Chronic kidney disease, stage 3b: Secondary | ICD-10-CM | POA: Diagnosis not present

## 2021-03-09 DIAGNOSIS — D631 Anemia in chronic kidney disease: Secondary | ICD-10-CM | POA: Diagnosis not present

## 2021-03-09 DIAGNOSIS — J439 Emphysema, unspecified: Secondary | ICD-10-CM | POA: Diagnosis not present

## 2021-03-09 DIAGNOSIS — I480 Paroxysmal atrial fibrillation: Secondary | ICD-10-CM | POA: Diagnosis not present

## 2021-03-09 DIAGNOSIS — I890 Lymphedema, not elsewhere classified: Secondary | ICD-10-CM | POA: Diagnosis not present

## 2021-03-09 DIAGNOSIS — I130 Hypertensive heart and chronic kidney disease with heart failure and stage 1 through stage 4 chronic kidney disease, or unspecified chronic kidney disease: Secondary | ICD-10-CM | POA: Diagnosis not present

## 2021-03-09 DIAGNOSIS — I442 Atrioventricular block, complete: Secondary | ICD-10-CM | POA: Diagnosis not present

## 2021-03-09 DIAGNOSIS — I479 Paroxysmal tachycardia, unspecified: Secondary | ICD-10-CM | POA: Diagnosis not present

## 2021-03-09 NOTE — Telephone Encounter (Signed)
Patients daughter called to reschedule appt for coumadin clinic and wanted to know if rescheduling for a later date is ok call back # [phone] also [facility] may be running a little late.

## 2021-03-11 ENCOUNTER — Other Ambulatory Visit: Payer: Self-pay | Admitting: *Deleted

## 2021-03-11 DIAGNOSIS — J4530 Mild persistent asthma, uncomplicated: Secondary | ICD-10-CM

## 2021-03-11 DIAGNOSIS — Z7901 Long term (current) use of anticoagulants: Secondary | ICD-10-CM

## 2021-03-11 MED ORDER — ATORVASTATIN CALCIUM 20 MG PO TABS: 20.0000 mg | ORAL_TABLET | Freq: Every day | ORAL | 0 refills | Status: DC

## 2021-03-11 MED ORDER — SPIRONOLACTONE 25 MG PO TABS: 12.5000 mg | ORAL_TABLET | Freq: Every day | ORAL | 0 refills | Status: DC

## 2021-03-11 MED ORDER — BUDESONIDE-FORMOTEROL FUMARATE 80-4.5 MCG/ACT IN AERO: 2.0000 | INHALATION_SPRAY | Freq: Two times a day (BID) | RESPIRATORY_TRACT | 1 refills | Status: DC

## 2021-03-11 MED ORDER — TORSEMIDE 10 MG PO TABS: 10.0000 mg | ORAL_TABLET | ORAL | 0 refills | Status: DC

## 2021-03-11 MED ORDER — POTASSIUM CHLORIDE CRYS ER 10 MEQ PO TBCR: 10.0000 meq | EXTENDED_RELEASE_TABLET | Freq: Every day | ORAL | 0 refills | Status: DC

## 2021-03-11 MED ORDER — METOPROLOL TARTRATE 25 MG PO TABS: 12.5000 mg | ORAL_TABLET | Freq: Two times a day (BID) | ORAL | 0 refills | Status: DC

## 2021-03-11 MED ORDER — MONTELUKAST SODIUM 10 MG PO TABS: ORAL_TABLET | ORAL | 0 refills | Status: DC

## 2021-03-11 MED ORDER — OMEPRAZOLE 20 MG PO CPDR: 20.0000 mg | DELAYED_RELEASE_CAPSULE | Freq: Every day | ORAL | 0 refills | Status: DC

## 2021-03-14 ENCOUNTER — Other Ambulatory Visit: Payer: Self-pay

## 2021-03-14 ENCOUNTER — Ambulatory Visit (INDEPENDENT_AMBULATORY_CARE_PROVIDER_SITE_OTHER): Payer: Medicare HMO | Admitting: General Practice

## 2021-03-14 DIAGNOSIS — Z7901 Long term (current) use of anticoagulants: Secondary | ICD-10-CM | POA: Diagnosis not present

## 2021-03-14 LAB — POCT INR: INR: 1.3 — AB (ref 2.0–3.0)

## 2021-03-14 NOTE — Patient Instructions (Signed)
Pre visit review using our clinic review tool, if applicable. No additional management support is needed unless otherwise documented below in the visit note.  Take 1 tablet today, tomorrow and Wednesday and then continue to take 1/2 tablet (peach colored 5 mg tablet) every day.  Re-check in 1 weeks.

## 2021-03-15 MED ORDER — WARFARIN SODIUM 5 MG PO TABS: ORAL_TABLET | ORAL | 1 refills | Status: DC

## 2021-03-17 DIAGNOSIS — I442 Atrioventricular block, complete: Secondary | ICD-10-CM | POA: Diagnosis not present

## 2021-03-17 DIAGNOSIS — I480 Paroxysmal atrial fibrillation: Secondary | ICD-10-CM | POA: Diagnosis not present

## 2021-03-17 DIAGNOSIS — I130 Hypertensive heart and chronic kidney disease with heart failure and stage 1 through stage 4 chronic kidney disease, or unspecified chronic kidney disease: Secondary | ICD-10-CM | POA: Diagnosis not present

## 2021-03-17 DIAGNOSIS — I5032 Chronic diastolic (congestive) heart failure: Secondary | ICD-10-CM | POA: Diagnosis not present

## 2021-03-17 DIAGNOSIS — J439 Emphysema, unspecified: Secondary | ICD-10-CM | POA: Diagnosis not present

## 2021-03-17 DIAGNOSIS — D631 Anemia in chronic kidney disease: Secondary | ICD-10-CM | POA: Diagnosis not present

## 2021-03-17 DIAGNOSIS — N1832 Chronic kidney disease, stage 3b: Secondary | ICD-10-CM | POA: Diagnosis not present

## 2021-03-17 DIAGNOSIS — I479 Paroxysmal tachycardia, unspecified: Secondary | ICD-10-CM | POA: Diagnosis not present

## 2021-03-17 DIAGNOSIS — I890 Lymphedema, not elsewhere classified: Secondary | ICD-10-CM | POA: Diagnosis not present

## 2021-03-21 ENCOUNTER — Other Ambulatory Visit: Payer: Self-pay

## 2021-03-21 ENCOUNTER — Ambulatory Visit (INDEPENDENT_AMBULATORY_CARE_PROVIDER_SITE_OTHER): Payer: Medicare HMO

## 2021-03-21 DIAGNOSIS — Z7901 Long term (current) use of anticoagulants: Secondary | ICD-10-CM

## 2021-03-21 LAB — POCT INR: INR: 1.4 — AB (ref 2.0–3.0)

## 2021-03-21 NOTE — Patient Instructions (Addendum)
Pre visit review using our clinic review tool, if applicable. No additional management support is needed unless otherwise documented below in the visit note.  Take 1 tablet today, tomorrow and Wednesday and then change weekly dose to take 1/2 tablet (peach colored 5 mg tablet cut in half) daily except take 1 tablet (peach colored 5 mg tablet, whole tablet) on Sundays. Re-check in 2 weeks.

## 2021-03-22 ENCOUNTER — Telehealth: Payer: Self-pay

## 2021-03-22 NOTE — Telephone Encounter (Signed)
Pt's God-daughter contacted the office this morning but did not leave a msg.  Tried to contact her but had to LVM

## 2021-03-24 DIAGNOSIS — I442 Atrioventricular block, complete: Secondary | ICD-10-CM | POA: Diagnosis not present

## 2021-03-24 DIAGNOSIS — I890 Lymphedema, not elsewhere classified: Secondary | ICD-10-CM | POA: Diagnosis not present

## 2021-03-24 DIAGNOSIS — I480 Paroxysmal atrial fibrillation: Secondary | ICD-10-CM | POA: Diagnosis not present

## 2021-03-24 DIAGNOSIS — D631 Anemia in chronic kidney disease: Secondary | ICD-10-CM | POA: Diagnosis not present

## 2021-03-24 DIAGNOSIS — N1832 Chronic kidney disease, stage 3b: Secondary | ICD-10-CM | POA: Diagnosis not present

## 2021-03-24 DIAGNOSIS — J439 Emphysema, unspecified: Secondary | ICD-10-CM | POA: Diagnosis not present

## 2021-03-24 DIAGNOSIS — I479 Paroxysmal tachycardia, unspecified: Secondary | ICD-10-CM | POA: Diagnosis not present

## 2021-03-24 DIAGNOSIS — I5032 Chronic diastolic (congestive) heart failure: Secondary | ICD-10-CM | POA: Diagnosis not present

## 2021-03-24 DIAGNOSIS — I130 Hypertensive heart and chronic kidney disease with heart failure and stage 1 through stage 4 chronic kidney disease, or unspecified chronic kidney disease: Secondary | ICD-10-CM | POA: Diagnosis not present

## 2021-03-30 ENCOUNTER — Other Ambulatory Visit: Payer: Self-pay

## 2021-03-30 NOTE — Patient Outreach (Signed)
[facility]) Care Management  03/30/2021  [name] 03-11-1941 [id number]   Telephone Assessment   Successful call placed to patient. She voices that she is doing fairly well. Denies any acute issues or concerns.   Medications Reviewed Today     Reviewed by Hayden Pedro, RN (Registered Nurse) on 03/30/21 at [place] List Status: <None>   Medication Order Taking? Sig Documenting Provider Last Dose Status Informant  atorvastatin (LIPITOR) 20 MG tablet [id number]  Take 1 tablet (20 mg total) by mouth daily at 6 PM. Isaac Bliss, Rayford Halsted, MD  Active   budesonide-formoterol [facility]) 80-4.5 MCG/ACT inhaler [id number]  Inhale 2 puffs into the lungs 2 (two) times daily. Isaac Bliss, Rayford Halsted, MD  Active   levalbuterol [facility]) 45 MCG/ACT inhaler [id number] No Inhale 2 puffs into the lungs every 4 (four) hours as needed for wheezing. Lauraine Rinne, NP Taking Active Self  metoprolol tartrate (LOPRESSOR) 25 MG tablet [id number]  Take 0.5 tablets (12.5 mg total) by mouth 2 (two) times daily. Isaac Bliss, Rayford Halsted, MD  Active   montelukast (SINGULAIR) 10 MG tablet [id number]  TAKE 1 TABLET BY MOUTH AT BEDTIME EVERY NIGHT Isaac Bliss, Rayford Halsted, MD  Active   omeprazole (PRILOSEC) 20 MG capsule [id number]  Take 1 capsule (20 mg total) by mouth daily. Take 30-60 min before first meal of the day Isaac Bliss, Rayford Halsted, MD  Active   potassium chloride (KLOR-CON) 10 MEQ tablet [id number]  Take 1 tablet (10 mEq total) by mouth daily. Isaac Bliss, Rayford Halsted, MD  Active   spironolactone (ALDACTONE) 25 MG tablet [id number]  Take 0.5 tablets (12.5 mg total) by mouth daily. Isaac Bliss, Rayford Halsted, MD  Active   torsemide [facility]) 10 MG tablet [id number]  Take 1 tablet (10 mg total) by mouth every Monday, Wednesday, and Friday. Isaac Bliss, Rayford Halsted, MD  Active   valsartan (DIOVAN) 160 MG tablet [id number] No Take 160 mg by mouth daily. [provider] Taking Active   warfarin (COUMADIN) 5 MG tablet [id number]  Take 1/2 tablet daily or take as directed by anticoagulation clinic Isaac Bliss, Rayford Halsted, MD  Active              Goals Addressed               This Visit's Progress     [facility] and Keep All Appointments (pt-stated)        Timeframe:  Long-Range Goal Priority:  High Start Date:  12/17/2020                           Expected End Date:  04/05/2021                     Follow Up Date June 2022    Barriers: Health Behaviors Knowledge    - ask family or friend for a ride - keep a calendar with prescription refill dates - keep a calendar with appointment dates  -review lit of PCPs and contact offices   Why is this important?   Part of staying healthy is seeing the doctor for follow-up care.  If you forget your appointments, there are some things you can do to stay on track.    Notes:  12/17/2020- Patient reports she has been using cardiologist as PCP but know she needs to find a PCP. RN CM mailed patient a list of  possible options based on patient's area that she prefers.   03/30/21-Patient has followed RN CM recommendations. She has found a PCP and established care with them. She reports she is enjoying new PCP.      THN)Track and Manage Heart Rate and Rhythm-Atrial Fibrillation (pt-stated)        Timeframe:  Long-Range Goal Priority:  High Start Date: 03/30/21                            Expected End Date:  Oct 2022                      Follow Up Date: Oct 2022   Barriers: Knowledge    - check pulse (heart) rate once a day - keep all lab appointments - take medicine as prescribed    Why is this important?   Atrial fibrillation may have no symptoms. Sometimes the symptoms get worse or happen more often.  It is important to keep track of what your symptoms are and when they happen.  A change in symptoms is important to discuss with your doctor or nurse.  Being active and healthy  eating will also help you manage your heart condition.     Notes:   03/30/21- Patient reports that she is compliant with taking meds. She has been going for regular routine visits to have labs monitored. She has not been checking HR in the home. States HH normally checks it when they visit her.          Plan: RN CM discussed with patient next outreach within the month of Oct. Patient agrees to care plan and follow up. Patient gave verbal consent and in agreement with RN CM follow up and timeframe. Patient aware that they may contact RN CM sooner for any issues or concerns. RN CM reviewed goals and plan of care with patient. RN CM will send quarterly update to PCP.  [name], RN,BSN,CCM [place] Management Telephonic Care Management Coordinator Direct Phone: [phone] Toll Free: [phone] Fax: [phone]

## 2021-03-31 DIAGNOSIS — I5032 Chronic diastolic (congestive) heart failure: Secondary | ICD-10-CM | POA: Diagnosis not present

## 2021-03-31 DIAGNOSIS — D631 Anemia in chronic kidney disease: Secondary | ICD-10-CM | POA: Diagnosis not present

## 2021-03-31 DIAGNOSIS — I479 Paroxysmal tachycardia, unspecified: Secondary | ICD-10-CM | POA: Diagnosis not present

## 2021-03-31 DIAGNOSIS — I130 Hypertensive heart and chronic kidney disease with heart failure and stage 1 through stage 4 chronic kidney disease, or unspecified chronic kidney disease: Secondary | ICD-10-CM | POA: Diagnosis not present

## 2021-03-31 DIAGNOSIS — I890 Lymphedema, not elsewhere classified: Secondary | ICD-10-CM | POA: Diagnosis not present

## 2021-03-31 DIAGNOSIS — I442 Atrioventricular block, complete: Secondary | ICD-10-CM | POA: Diagnosis not present

## 2021-03-31 DIAGNOSIS — J439 Emphysema, unspecified: Secondary | ICD-10-CM | POA: Diagnosis not present

## 2021-03-31 DIAGNOSIS — I480 Paroxysmal atrial fibrillation: Secondary | ICD-10-CM | POA: Diagnosis not present

## 2021-03-31 DIAGNOSIS — N1832 Chronic kidney disease, stage 3b: Secondary | ICD-10-CM | POA: Diagnosis not present

## 2021-04-04 ENCOUNTER — Ambulatory Visit: Payer: Medicare HMO

## 2021-04-04 ENCOUNTER — Ambulatory Visit (INDEPENDENT_AMBULATORY_CARE_PROVIDER_SITE_OTHER): Payer: Medicare HMO

## 2021-04-04 ENCOUNTER — Other Ambulatory Visit: Payer: Self-pay

## 2021-04-04 ENCOUNTER — Telehealth: Payer: Self-pay | Admitting: Internal Medicine

## 2021-04-04 DIAGNOSIS — Z7901 Long term (current) use of anticoagulants: Secondary | ICD-10-CM | POA: Diagnosis not present

## 2021-04-04 LAB — POCT INR: INR: 1.8 — AB (ref 2.0–3.0)

## 2021-04-04 NOTE — Patient Instructions (Addendum)
Pre visit review using our clinic review tool, if applicable. No additional management support is needed unless otherwise documented below in the visit note.   Take 1 1/2 tablets today and then change weekly dose to take 5 mg on Sun, Mon, Tues, and Wed and take 2.5 mg on Thurs, Fri, and Sat. Recheck in 2 wks.

## 2021-04-04 NOTE — Telephone Encounter (Signed)
Patient dropped off paperwork that she needs [name] to complete.Patient stated that [name] forgot to put physician number. The paperwork will be placed in the folder.  Patient can be contacted at [phone] when paperwork is complete.  Please advise.

## 2021-04-05 NOTE — Telephone Encounter (Signed)
Parking placard ready for pick up.  Left message on machine for patient.

## 2021-04-06 ENCOUNTER — Ambulatory Visit: Payer: Medicare HMO

## 2021-04-07 DIAGNOSIS — I479 Paroxysmal tachycardia, unspecified: Secondary | ICD-10-CM | POA: Diagnosis not present

## 2021-04-07 DIAGNOSIS — N1832 Chronic kidney disease, stage 3b: Secondary | ICD-10-CM | POA: Diagnosis not present

## 2021-04-07 DIAGNOSIS — I480 Paroxysmal atrial fibrillation: Secondary | ICD-10-CM | POA: Diagnosis not present

## 2021-04-07 DIAGNOSIS — D631 Anemia in chronic kidney disease: Secondary | ICD-10-CM | POA: Diagnosis not present

## 2021-04-07 DIAGNOSIS — J439 Emphysema, unspecified: Secondary | ICD-10-CM | POA: Diagnosis not present

## 2021-04-07 DIAGNOSIS — I442 Atrioventricular block, complete: Secondary | ICD-10-CM | POA: Diagnosis not present

## 2021-04-07 DIAGNOSIS — I5032 Chronic diastolic (congestive) heart failure: Secondary | ICD-10-CM | POA: Diagnosis not present

## 2021-04-07 DIAGNOSIS — I130 Hypertensive heart and chronic kidney disease with heart failure and stage 1 through stage 4 chronic kidney disease, or unspecified chronic kidney disease: Secondary | ICD-10-CM | POA: Diagnosis not present

## 2021-04-07 DIAGNOSIS — I890 Lymphedema, not elsewhere classified: Secondary | ICD-10-CM | POA: Diagnosis not present

## 2021-04-14 DIAGNOSIS — I890 Lymphedema, not elsewhere classified: Secondary | ICD-10-CM | POA: Diagnosis not present

## 2021-04-14 DIAGNOSIS — I130 Hypertensive heart and chronic kidney disease with heart failure and stage 1 through stage 4 chronic kidney disease, or unspecified chronic kidney disease: Secondary | ICD-10-CM | POA: Diagnosis not present

## 2021-04-14 DIAGNOSIS — I479 Paroxysmal tachycardia, unspecified: Secondary | ICD-10-CM | POA: Diagnosis not present

## 2021-04-14 DIAGNOSIS — I442 Atrioventricular block, complete: Secondary | ICD-10-CM | POA: Diagnosis not present

## 2021-04-14 DIAGNOSIS — J439 Emphysema, unspecified: Secondary | ICD-10-CM | POA: Diagnosis not present

## 2021-04-14 DIAGNOSIS — D631 Anemia in chronic kidney disease: Secondary | ICD-10-CM | POA: Diagnosis not present

## 2021-04-14 DIAGNOSIS — I480 Paroxysmal atrial fibrillation: Secondary | ICD-10-CM | POA: Diagnosis not present

## 2021-04-14 DIAGNOSIS — N1832 Chronic kidney disease, stage 3b: Secondary | ICD-10-CM | POA: Diagnosis not present

## 2021-04-14 DIAGNOSIS — I5032 Chronic diastolic (congestive) heart failure: Secondary | ICD-10-CM | POA: Diagnosis not present

## 2021-04-18 ENCOUNTER — Other Ambulatory Visit: Payer: Self-pay

## 2021-04-18 ENCOUNTER — Ambulatory Visit (INDEPENDENT_AMBULATORY_CARE_PROVIDER_SITE_OTHER): Payer: Medicare HMO

## 2021-04-18 DIAGNOSIS — I495 Sick sinus syndrome: Secondary | ICD-10-CM | POA: Diagnosis not present

## 2021-04-18 DIAGNOSIS — Z7901 Long term (current) use of anticoagulants: Secondary | ICD-10-CM | POA: Diagnosis not present

## 2021-04-18 LAB — POCT INR: INR: 4.1 — AB (ref 2.0–3.0)

## 2021-04-18 NOTE — Patient Instructions (Addendum)
Pre visit review using our clinic review tool, if applicable. No additional management support is needed unless otherwise documented below in the visit note.  Hold dose today and only take 1/2 tablet tomorrow and then change weekly dose to take 1/2 tablet daily except take 1 tablet on Mon, Wed, and Fri.  Recheck in 2 wks.

## 2021-04-19 DIAGNOSIS — I442 Atrioventricular block, complete: Secondary | ICD-10-CM | POA: Diagnosis not present

## 2021-04-19 DIAGNOSIS — I5032 Chronic diastolic (congestive) heart failure: Secondary | ICD-10-CM | POA: Diagnosis not present

## 2021-04-19 DIAGNOSIS — I479 Paroxysmal tachycardia, unspecified: Secondary | ICD-10-CM | POA: Diagnosis not present

## 2021-04-19 DIAGNOSIS — J439 Emphysema, unspecified: Secondary | ICD-10-CM | POA: Diagnosis not present

## 2021-04-19 DIAGNOSIS — N1832 Chronic kidney disease, stage 3b: Secondary | ICD-10-CM | POA: Diagnosis not present

## 2021-04-19 DIAGNOSIS — D631 Anemia in chronic kidney disease: Secondary | ICD-10-CM | POA: Diagnosis not present

## 2021-04-19 DIAGNOSIS — I130 Hypertensive heart and chronic kidney disease with heart failure and stage 1 through stage 4 chronic kidney disease, or unspecified chronic kidney disease: Secondary | ICD-10-CM | POA: Diagnosis not present

## 2021-04-19 DIAGNOSIS — I480 Paroxysmal atrial fibrillation: Secondary | ICD-10-CM | POA: Diagnosis not present

## 2021-04-19 DIAGNOSIS — I890 Lymphedema, not elsewhere classified: Secondary | ICD-10-CM | POA: Diagnosis not present

## 2021-04-19 LAB — CUP PACEART REMOTE DEVICE CHECK
Battery Remaining Longevity: 104 mo
Battery Remaining Percentage: 92 %
Battery Voltage: 3.01 V
Brady Statistic AP VP Percent: 31 %
Brady Statistic AP VS Percent: 1 %
Brady Statistic AS VP Percent: 69 %
Brady Statistic AS VS Percent: 1 %
Brady Statistic RA Percent Paced: 30 %
Brady Statistic RV Percent Paced: 99 %
Date Time Interrogation Session: 20220912020012
Implantable Lead Implant Date: 20120702
Implantable Lead Implant Date: 20120702
Implantable Lead Location: 753859
Implantable Lead Location: 753860
Implantable Pulse Generator Implant Date: 20210913
Lead Channel Impedance Value: 380 Ohm
Lead Channel Impedance Value: 410 Ohm
Lead Channel Pacing Threshold Amplitude: 0.75 V
Lead Channel Pacing Threshold Amplitude: 1 V
Lead Channel Pacing Threshold Pulse Width: 0.4 ms
Lead Channel Pacing Threshold Pulse Width: 0.5 ms
Lead Channel Sensing Intrinsic Amplitude: 3.2 mV
Lead Channel Setting Pacing Amplitude: 1.25 V
Lead Channel Setting Pacing Amplitude: 1.75 V
Lead Channel Setting Pacing Pulse Width: 0.5 ms
Lead Channel Setting Sensing Sensitivity: 4 mV
Pulse Gen Model: 2272
Pulse Gen Serial Number: 3864080

## 2021-04-21 DIAGNOSIS — I479 Paroxysmal tachycardia, unspecified: Secondary | ICD-10-CM | POA: Diagnosis not present

## 2021-04-21 DIAGNOSIS — N1832 Chronic kidney disease, stage 3b: Secondary | ICD-10-CM | POA: Diagnosis not present

## 2021-04-21 DIAGNOSIS — I442 Atrioventricular block, complete: Secondary | ICD-10-CM | POA: Diagnosis not present

## 2021-04-21 DIAGNOSIS — J439 Emphysema, unspecified: Secondary | ICD-10-CM | POA: Diagnosis not present

## 2021-04-21 DIAGNOSIS — I5032 Chronic diastolic (congestive) heart failure: Secondary | ICD-10-CM | POA: Diagnosis not present

## 2021-04-21 DIAGNOSIS — D631 Anemia in chronic kidney disease: Secondary | ICD-10-CM | POA: Diagnosis not present

## 2021-04-21 DIAGNOSIS — I130 Hypertensive heart and chronic kidney disease with heart failure and stage 1 through stage 4 chronic kidney disease, or unspecified chronic kidney disease: Secondary | ICD-10-CM | POA: Diagnosis not present

## 2021-04-21 DIAGNOSIS — I890 Lymphedema, not elsewhere classified: Secondary | ICD-10-CM | POA: Diagnosis not present

## 2021-04-21 DIAGNOSIS — I480 Paroxysmal atrial fibrillation: Secondary | ICD-10-CM | POA: Diagnosis not present

## 2021-04-22 NOTE — Progress Notes (Signed)
Remote pacemaker transmission.   

## 2021-04-27 DIAGNOSIS — I890 Lymphedema, not elsewhere classified: Secondary | ICD-10-CM | POA: Diagnosis not present

## 2021-04-27 DIAGNOSIS — I480 Paroxysmal atrial fibrillation: Secondary | ICD-10-CM | POA: Diagnosis not present

## 2021-04-27 DIAGNOSIS — I479 Paroxysmal tachycardia, unspecified: Secondary | ICD-10-CM | POA: Diagnosis not present

## 2021-04-27 DIAGNOSIS — I130 Hypertensive heart and chronic kidney disease with heart failure and stage 1 through stage 4 chronic kidney disease, or unspecified chronic kidney disease: Secondary | ICD-10-CM | POA: Diagnosis not present

## 2021-04-27 DIAGNOSIS — I5032 Chronic diastolic (congestive) heart failure: Secondary | ICD-10-CM | POA: Diagnosis not present

## 2021-04-27 DIAGNOSIS — N1832 Chronic kidney disease, stage 3b: Secondary | ICD-10-CM | POA: Diagnosis not present

## 2021-04-27 DIAGNOSIS — D631 Anemia in chronic kidney disease: Secondary | ICD-10-CM | POA: Diagnosis not present

## 2021-04-27 DIAGNOSIS — J439 Emphysema, unspecified: Secondary | ICD-10-CM | POA: Diagnosis not present

## 2021-04-27 DIAGNOSIS — I442 Atrioventricular block, complete: Secondary | ICD-10-CM | POA: Diagnosis not present

## 2021-04-28 ENCOUNTER — Emergency Department (HOSPITAL_COMMUNITY): Payer: Medicare HMO

## 2021-04-28 ENCOUNTER — Emergency Department (HOSPITAL_COMMUNITY)
Admission: EM | Admit: 2021-04-28 | Discharge: 2021-04-29 | Disposition: A | Discharge status: Home / Self Care | Payer: Medicare HMO | Attending: Emergency Medicine | Admitting: Emergency Medicine

## 2021-04-28 ENCOUNTER — Other Ambulatory Visit: Payer: Self-pay

## 2021-04-28 DIAGNOSIS — M7989 Other specified soft tissue disorders: Secondary | ICD-10-CM | POA: Diagnosis not present

## 2021-04-28 DIAGNOSIS — J45909 Unspecified asthma, uncomplicated: Secondary | ICD-10-CM | POA: Diagnosis not present

## 2021-04-28 DIAGNOSIS — I110 Hypertensive heart disease with heart failure: Secondary | ICD-10-CM | POA: Insufficient documentation

## 2021-04-28 DIAGNOSIS — Z87891 Personal history of nicotine dependence: Secondary | ICD-10-CM | POA: Insufficient documentation

## 2021-04-28 DIAGNOSIS — Z950 Presence of cardiac pacemaker: Secondary | ICD-10-CM | POA: Insufficient documentation

## 2021-04-28 DIAGNOSIS — J029 Acute pharyngitis, unspecified: Secondary | ICD-10-CM | POA: Diagnosis not present

## 2021-04-28 DIAGNOSIS — R0602 Shortness of breath: Secondary | ICD-10-CM | POA: Insufficient documentation

## 2021-04-28 DIAGNOSIS — R61 Generalized hyperhidrosis: Secondary | ICD-10-CM | POA: Diagnosis not present

## 2021-04-28 DIAGNOSIS — R609 Edema, unspecified: Secondary | ICD-10-CM | POA: Diagnosis not present

## 2021-04-28 DIAGNOSIS — I509 Heart failure, unspecified: Secondary | ICD-10-CM | POA: Diagnosis not present

## 2021-04-28 DIAGNOSIS — R000 Tachycardia, unspecified: Secondary | ICD-10-CM | POA: Diagnosis not present

## 2021-04-28 DIAGNOSIS — R0600 Dyspnea, unspecified: Secondary | ICD-10-CM

## 2021-04-28 DIAGNOSIS — R059 Cough, unspecified: Secondary | ICD-10-CM | POA: Insufficient documentation

## 2021-04-28 DIAGNOSIS — J80 Acute respiratory distress syndrome: Secondary | ICD-10-CM | POA: Diagnosis not present

## 2021-04-28 LAB — CBC
HCT: 36.6 % (ref 36.0–46.0)
Hemoglobin: 11.4 g/dL — ABNORMAL LOW (ref 12.0–15.0)
MCH: 28.9 pg (ref 26.0–34.0)
MCHC: 31.1 g/dL (ref 30.0–36.0)
MCV: 92.9 fL (ref 80.0–100.0)
Platelets: 247 10*3/uL (ref 150–400)
RBC: 3.94 MIL/uL (ref 3.87–5.11)
RDW: 18.3 % — ABNORMAL HIGH (ref 11.5–15.5)
WBC: 6.8 10*3/uL (ref 4.0–10.5)
nRBC: 0 % (ref 0.0–0.2)

## 2021-04-28 NOTE — ED Notes (Signed)
[name] who says he is next of kin was calling wanting a update, [phone] is his call back #

## 2021-04-28 NOTE — ED Triage Notes (Signed)
Pt arrived via [facility] from home. Pt c/o SOB while at home that started after some exertion (walking around the home). Per EMS pt has not been in obvious distress while in their care. Pt has hx CHF, COPD, and asthma. EMS reports LS clear with SpO2 maintaining WNL. Pt in paced rhythm (demand) on 12 lead ECG. Pt presents to ED caox4 still c/o SOB without apparent distress. Pt also has edema in lower legs bilateral which she states she noticed this morning.

## 2021-04-28 NOTE — ED Provider Notes (Signed)
[facility] Emergency Department Provider Note MRN:  [id number]  Arrival date & time: 04/29/21     Chief Complaint   Shortness of Breath   History of Present Illness   [name] is a 80 y.o. year-old female with a history of CHF, pacemaker presenting to the ED with chief complaint of shortness of breath.  Location: Chest Duration: 3 to 4 days Onset: Gradual Timing: Constant, slowly worsening Description: Trouble breathing Severity: Mild to moderate Exacerbating/Alleviating Factors: Worse when ambulating, worse when trying to lay flat Associated Symptoms: Cough, sore throat, low energy, increased leg swelling right greater than left Pertinent Negatives: No fever, no chest pain, no abdominal pain, no numbness or weakness to the arms or legs  Additional History: Has not been taking her medicines as good as she should  Review of Systems  A complete 10 system review of systems was obtained and all systems are negative except as noted in the HPI and PMH.   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, no surgery in kidneys    Past Surgical History:  Procedure Laterality Date   ABDOMINAL HYSTERECTOMY     BREAST BIOPSY  04/30/2012   Procedure: BREAST BIOPSY WITH NEEDLE LOCALIZATION;  Surgeon: Rolm Bookbinder, MD;  Location: [facility];  Service: General;  Laterality: Left;  Left breast wire localization biospy   HEMORRHOID SURGERY     PACEMAKER INSERTION  02-06-2011   PPM GENERATOR CHANGEOUT N/A 04/19/2020   Procedure: PPM GENERATOR CHANGEOUT;  Surgeon: Evans Lance, MD;  Location: [facility];  Service: Cardiovascular;  Laterality: N/A;   TAH and BSO     TONSILLECTOMY      Family History  Problem Relation Age of Onset   Heart disease Mother    Cancer Sister        breast    Social History   Socioeconomic History   Marital status: Divorced    Spouse name: Not on file   Number of children: 2   Years of education: Not on file   Highest education level: Not on file  Occupational History   Occupation: cna  Tobacco Use   Smoking status: Former    Packs/day: 1.00    Years: 4.00    Pack years: 4.00    Types: Cigarettes    Quit date: 08/07/1977    Years since quitting: 43.7   Smokeless tobacco: Never  Vaping Use   Vaping Use: Never used  Substance and Sexual Activity   Alcohol use: No    Alcohol/week: 0.0 standard drinks    Comment: quit 1979   Drug use: No   Sexual activity: Not on file  Other Topics Concern   Not on file  Social History Narrative   Pt has 11 siblings in all   Social Determinants of Health   Financial Resource Strain: Not on file  Food Insecurity: No Food Insecurity   Worried About Running Out  of Food in the Last Year: Never true   [place] in the Last Year: Never true  Transportation Needs: Unknown   Lack of Transportation (Medical): No   Lack of Transportation (Non-Medical): Not on file  Physical Activity: Not on file  Stress: Not on file  Social Connections: Not on file  Intimate Partner Violence: Not on file     Physical Exam   Vitals:   04/29/21 0230 04/29/21 0245  BP: (!) 126/109 (!) 126/58  Pulse: 64 65  Resp: 16 12  Temp:    SpO2: 100% 100%    CONSTITUTIONAL: Well-appearing, NAD NEURO:  Alert and oriented x 3, no focal deficits EYES:  eyes equal and reactive ENT/NECK:  no LAD, no JVD CARDIO: Regular rate, well-perfused, normal S1 and S2 PULM:  CTAB no wheezing or rhonchi GI/GU:  normal bowel sounds, non-distended, non-tender MSK/SPINE:  No gross deformities, scant edema to bilateral lower  extremities, right greater than left SKIN:  no rash, atraumatic PSYCH:  Appropriate speech and behavior  *Additional and/or pertinent findings included in MDM below  Diagnostic and Interventional Summary    EKG Interpretation  Date/Time:  Thursday April 28 2021 22:55:58 EDT Ventricular Rate:  155 PR Interval:  179 QRS Duration: 188 QT Interval:  449 QTC Calculation: 525 R Axis:   10 Text Interpretation: Atrial-sensed ventricular-paced rhythm No significant change was found Confirmed by Gerlene Fee [phone]) on 04/28/2021 11:03:06 PM       Labs Reviewed  CBC - Abnormal; Notable for the following components:      Result Value   Hemoglobin 11.4 (*)    RDW 18.3 (*)    All other components within normal limits  COMPREHENSIVE METABOLIC PANEL - Abnormal; Notable for the following components:   Potassium 5.4 (*)    Creatinine, Ser 1.57 (*)    Calcium 8.3 (*)    Total Protein 6.3 (*)    Albumin 2.9 (*)    Alkaline Phosphatase 129 (*)    GFR, Estimated 33 (*)    All other components within normal limits  URINALYSIS, ROUTINE W REFLEX MICROSCOPIC - Abnormal; Notable for the following components:   APPearance HAZY (*)    Leukocytes,Ua LARGE (*)    WBC, UA >50 (*)    Bacteria, UA RARE (*)    All other components within normal limits  BRAIN NATRIURETIC PEPTIDE  D-DIMER, QUANTITATIVE  TROPONIN I (HIGH SENSITIVITY)  TROPONIN I (HIGH SENSITIVITY)    DG Chest Port 1 View  Final Result      Medications - No data to display   Procedures  /  Critical Care Procedures  ED Course and Medical Decision Making  I have reviewed the triage vital signs, the nursing notes, and pertinent available records from the EMR.  Listed above are laboratory and imaging tests that I personally ordered, reviewed, and interpreted and then considered in my medical decision making (see below for details).  Favoring CHF exacerbation, mild in nature.  Patient is sitting comfortably no acute distress.   Has a soft blood pressure here in the emergency department.  Will monitor closely.  Other considerations include ACS, pneumonia, COVID-19, felt to be low risk for PE.  Patient does have asymmetric leg swelling which she says this is chronic.  Awaiting labs, chest x-ray.     Work-up is reassuring with normal chest x-ray, negative troponin, negative D-dimer.  Patient remains well-appearing, no increased work of breathing, no distress of any kind, normal vital signs.  Nothing to suggest emergent process, appropriate for discharge.  Barth Kirks. Sedonia Small, [place] [email or web address]  Final Clinical Impressions(s) / ED Diagnoses     ICD-10-CM   1. Dyspnea, unspecified type  R06.00       ED Discharge Orders     None        Discharge Instructions Discussed with and Provided to Patient:     Discharge Instructions      You were evaluated in the Emergency Department and after careful evaluation, we did not find any emergent condition requiring admission or further testing in the hospital.  Your exam/testing today was overall reassuring.  Recommend close follow-up with your regular doctor to discuss your symptoms.  Please return to the Emergency Department if you experience any worsening of your condition.  Thank you for allowing [place] to be a part of your care.         Maudie Flakes, MD 04/29/21 [phone]

## 2021-04-29 DIAGNOSIS — R0602 Shortness of breath: Secondary | ICD-10-CM | POA: Diagnosis not present

## 2021-04-29 LAB — URINALYSIS, ROUTINE W REFLEX MICROSCOPIC
Bilirubin Urine: NEGATIVE
Glucose, UA: NEGATIVE mg/dL
Hgb urine dipstick: NEGATIVE
Ketones, ur: NEGATIVE mg/dL
Nitrite: NEGATIVE
Protein, ur: NEGATIVE mg/dL
Specific Gravity, Urine: 1.015 (ref 1.005–1.030)
WBC, UA: >50 WBC/hpf — ABNORMAL HIGH (ref 0–5)
pH: 6 (ref 5.0–8.0)

## 2021-04-29 LAB — TROPONIN I (HIGH SENSITIVITY)
Troponin I (High Sensitivity): 5 ng/L (ref <18)
Troponin I (High Sensitivity): 7 ng/L (ref <18)

## 2021-04-29 LAB — COMPREHENSIVE METABOLIC PANEL
ALT: 14 U/L (ref 0–44)
AST: 34 U/L (ref 15–41)
Albumin: 2.9 g/dL — ABNORMAL LOW (ref 3.5–5.0)
Alkaline Phosphatase: 129 U/L — ABNORMAL HIGH (ref 38–126)
Anion gap: 6 (ref 5–15)
BUN: 17 mg/dL (ref 8–23)
CO2: 26 mmol/L (ref 22–32)
Calcium: 8.3 mg/dL — ABNORMAL LOW (ref 8.9–10.3)
Chloride: 105 mmol/L (ref 98–111)
Creatinine, Ser: 1.57 mg/dL — ABNORMAL HIGH (ref 0.44–1.00)
GFR, Estimated: 33 mL/min — ABNORMAL LOW (ref >60)
Glucose, Bld: 99 mg/dL (ref 70–99)
Potassium: 5.4 mmol/L — ABNORMAL HIGH (ref 3.5–5.1)
Sodium: 137 mmol/L (ref 135–145)
Total Bilirubin: 0.7 mg/dL (ref 0.3–1.2)
Total Protein: 6.3 g/dL — ABNORMAL LOW (ref 6.5–8.1)

## 2021-04-29 LAB — D-DIMER, QUANTITATIVE: D-Dimer, Quant: 0.33 ug/mL-FEU (ref 0.00–0.50)

## 2021-04-29 LAB — BRAIN NATRIURETIC PEPTIDE: B Natriuretic Peptide: 28.6 pg/mL (ref 0.0–100.0)

## 2021-04-29 NOTE — Discharge Instructions (Addendum)
You were evaluated in the Emergency Department and after careful evaluation, we did not find any emergent condition requiring admission or further testing in the hospital.  Your exam/testing today was overall reassuring.  Recommend close follow-up with your regular doctor to discuss your symptoms.  Please return to the Emergency Department if you experience any worsening of your condition.  Thank you for allowing [place] to be a part of your care.

## 2021-05-03 ENCOUNTER — Other Ambulatory Visit: Payer: Self-pay

## 2021-05-04 ENCOUNTER — Ambulatory Visit (INDEPENDENT_AMBULATORY_CARE_PROVIDER_SITE_OTHER): Payer: Medicare HMO

## 2021-05-04 DIAGNOSIS — Z7901 Long term (current) use of anticoagulants: Secondary | ICD-10-CM | POA: Diagnosis not present

## 2021-05-04 LAB — POCT INR: INR: 2.8 (ref 2.0–3.0)

## 2021-05-04 NOTE — Patient Instructions (Addendum)
Pre visit review using our clinic review tool, if applicable. No additional management support is needed unless otherwise documented below in the visit note.  Continue 1/2 tablet daily except take 1 tablet on Mon, Wed, and Fri.  Recheck in 3 wks.

## 2021-05-05 DIAGNOSIS — N1832 Chronic kidney disease, stage 3b: Secondary | ICD-10-CM | POA: Diagnosis not present

## 2021-05-05 DIAGNOSIS — I442 Atrioventricular block, complete: Secondary | ICD-10-CM | POA: Diagnosis not present

## 2021-05-05 DIAGNOSIS — I479 Paroxysmal tachycardia, unspecified: Secondary | ICD-10-CM | POA: Diagnosis not present

## 2021-05-05 DIAGNOSIS — I890 Lymphedema, not elsewhere classified: Secondary | ICD-10-CM | POA: Diagnosis not present

## 2021-05-05 DIAGNOSIS — I5032 Chronic diastolic (congestive) heart failure: Secondary | ICD-10-CM | POA: Diagnosis not present

## 2021-05-05 DIAGNOSIS — I480 Paroxysmal atrial fibrillation: Secondary | ICD-10-CM | POA: Diagnosis not present

## 2021-05-05 DIAGNOSIS — J439 Emphysema, unspecified: Secondary | ICD-10-CM | POA: Diagnosis not present

## 2021-05-05 DIAGNOSIS — D631 Anemia in chronic kidney disease: Secondary | ICD-10-CM | POA: Diagnosis not present

## 2021-05-05 DIAGNOSIS — I130 Hypertensive heart and chronic kidney disease with heart failure and stage 1 through stage 4 chronic kidney disease, or unspecified chronic kidney disease: Secondary | ICD-10-CM | POA: Diagnosis not present

## 2021-05-10 DIAGNOSIS — I5032 Chronic diastolic (congestive) heart failure: Secondary | ICD-10-CM | POA: Diagnosis not present

## 2021-05-10 DIAGNOSIS — I479 Paroxysmal tachycardia, unspecified: Secondary | ICD-10-CM | POA: Diagnosis not present

## 2021-05-10 DIAGNOSIS — N1832 Chronic kidney disease, stage 3b: Secondary | ICD-10-CM | POA: Diagnosis not present

## 2021-05-10 DIAGNOSIS — J439 Emphysema, unspecified: Secondary | ICD-10-CM | POA: Diagnosis not present

## 2021-05-10 DIAGNOSIS — I442 Atrioventricular block, complete: Secondary | ICD-10-CM | POA: Diagnosis not present

## 2021-05-10 DIAGNOSIS — I130 Hypertensive heart and chronic kidney disease with heart failure and stage 1 through stage 4 chronic kidney disease, or unspecified chronic kidney disease: Secondary | ICD-10-CM | POA: Diagnosis not present

## 2021-05-10 DIAGNOSIS — I890 Lymphedema, not elsewhere classified: Secondary | ICD-10-CM | POA: Diagnosis not present

## 2021-05-10 DIAGNOSIS — I480 Paroxysmal atrial fibrillation: Secondary | ICD-10-CM | POA: Diagnosis not present

## 2021-05-10 DIAGNOSIS — D631 Anemia in chronic kidney disease: Secondary | ICD-10-CM | POA: Diagnosis not present

## 2021-05-16 ENCOUNTER — Other Ambulatory Visit: Payer: Self-pay

## 2021-05-16 ENCOUNTER — Encounter (HOSPITAL_BASED_OUTPATIENT_CLINIC_OR_DEPARTMENT_OTHER): Payer: Self-pay | Admitting: Cardiovascular Disease

## 2021-05-16 ENCOUNTER — Ambulatory Visit (HOSPITAL_BASED_OUTPATIENT_CLINIC_OR_DEPARTMENT_OTHER): Payer: Medicare HMO | Admitting: Cardiovascular Disease

## 2021-05-16 VITALS — BP 95/57 | HR 63 | Ht 67.0 in | Wt 215.3 lb

## 2021-05-16 DIAGNOSIS — R400 Somnolence: Secondary | ICD-10-CM

## 2021-05-16 DIAGNOSIS — I5032 Chronic diastolic (congestive) heart failure: Secondary | ICD-10-CM | POA: Diagnosis not present

## 2021-05-16 DIAGNOSIS — R0683 Snoring: Secondary | ICD-10-CM | POA: Diagnosis not present

## 2021-05-16 DIAGNOSIS — I480 Paroxysmal atrial fibrillation: Secondary | ICD-10-CM | POA: Diagnosis not present

## 2021-05-16 DIAGNOSIS — R0602 Shortness of breath: Secondary | ICD-10-CM | POA: Diagnosis not present

## 2021-05-16 DIAGNOSIS — I10 Essential (primary) hypertension: Secondary | ICD-10-CM

## 2021-05-16 MED ORDER — VALSARTAN 80 MG PO TABS: 80.0000 mg | ORAL_TABLET | Freq: Every day | ORAL | 3 refills | Status: DC

## 2021-05-16 NOTE — Progress Notes (Signed)
Cardiology Office Note:    Date:  05/16/2021   ID:  [name], DOB 08/30/1940, MRN [id number]  PCP:  Isaac Bliss, Rayford Halsted, MD   [facility] Prov[facility] Cardiologist:  Cristopher Peru, MD Electrophysiologist:  Cristopher Peru, MD     Referring MD: Evans Lance, MD   No chief complaint on file.   History of Present Illness:    [name] is a 80 y.o. 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 She was seen in the ED 04/2021 for shortness of breath. BNP was within normal limits, high sensitivity troponin was negative, and D-dimer was normal. It was thought that her symptoms may be attributable to diastolic heart failure. She was seen in the hospital 01/2021 with leg swelling that was attributed to cellulitis. Nuclear stress test 05/2019 revealed LVEF 63% and no ischemia.  Today, she is feeling well overall. At her recent hospital visit, it was difficult for her to breathe. Her shortness of breath had been gradually worsening for about a week. Occasionally she feels chest pressure after moving around for a while. This has been intermittent for some time, and does not seem to be worsening. She does not check her blood pressure at home consistently. When she does check it her blood pressure is "moderate". About 2 weeks ago, she was starting to get up and became dizzy. After sitting down her symptoms resolved. She does not remember the last time she had a similar episode of dizziness. For years she has had LE edema, and may be worse depending on her activity. Every now and then she wears compression socks, but does not notice much improvement. For exercise, she knows she does not walk like she should. However she does feel stable on her feet,  and usually brings her cane. When she does walk, she tries to walk up and down a hill near her home. This does cause shortness of breath walking up the hill, but she feels better going down the hill. Lately, she endorses occasional orthopnea, and has been told that she snores. Most of the time she feels well rested in the morning, but sometimes finds herself drowsy during the day. She denies any palpitations, headaches, or syncope.    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   rxd 3 yrs meds, no surgery in kidneys    Past Surgical History:  Procedure Laterality Date   ABDOMINAL HYSTERECTOMY     BREAST BIOPSY  04/30/2012   Procedure: BREAST BIOPSY WITH NEEDLE LOCALIZATION;  Surgeon: Rolm Bookbinder, MD;  Location: [facility];  Service: General;  Laterality: Left;  Left breast wire localization biospy   HEMORRHOID SURGERY     PACEMAKER INSERTION  02-06-2011   PPM GENERATOR CHANGEOUT N/A 04/19/2020   Procedure: PPM GENERATOR CHANGEOUT;  Surgeon: Evans Lance, MD;  Location:  [facility];  Service: Cardiovascular;  Laterality: N/A;   TAH and BSO     TONSILLECTOMY      Current Medications: Current Meds  Medication Sig   atorvastatin (LIPITOR) 20 MG tablet Take 1 tablet (20 mg total) by mouth daily at 6 PM.   budesonide-formoterol (SYMBICORT) 80-4.5 MCG/ACT inhaler Inhale 2 puffs into the lungs 2 (two) times daily.   levalbuterol (XOPENEX HFA) 45 MCG/ACT inhaler Inhale 2 puffs into the lungs every 4 (four) hours as needed for wheezing.   metoprolol tartrate (LOPRESSOR) 25 MG tablet Take 0.5 tablets (12.5 mg total) by mouth 2 (two) times daily.   montelukast  (SINGULAIR) 10 MG tablet TAKE 1 TABLET BY MOUTH AT BEDTIME EVERY NIGHT   omeprazole (PRILOSEC) 20 MG capsule Take 1 capsule (20 mg total) by mouth daily. Take 30-60 min before first meal of the day   potassium chloride (KLOR-CON) 10 MEQ tablet Take 1 tablet (10 mEq total) by mouth daily.   spironolactone (ALDACTONE) 25 MG tablet Take 0.5 tablets (12.5 mg total) by mouth daily.   torsemide (DEMADEX) 10 MG tablet Take 1 tablet (10 mg total) by mouth every Monday, Wednesday, and Friday.   valsartan (DIOVAN) 160 MG tablet Take 160 mg by mouth daily.   warfarin (COUMADIN) 5 MG tablet Take 1/2 tablet daily or take as directed by anticoagulation clinic     Allergies:   Albuterol   Social History   Socioeconomic History   Marital status: Divorced    Spouse name: Not on file   Number of children: 2   Years of education: Not on file   Highest education level: Not on file  Occupational History   Occupation: cna  Tobacco Use   Smoking status: Former    Packs/day: 1.00    Years: 4.00    Pack years: 4.00    Types: Cigarettes    Quit date: 08/07/1977    Years since quitting: 43.8   Smokeless tobacco: Never  Vaping Use   Vaping Use: Never used  Substance and Sexual Activity   Alcohol use: No    Alcohol/week: 0.0 standard drinks    Comment: quit 1979   Drug use: No   Sexual activity: Not on file  Other Topics Concern   Not on file  Social History Narrative   Pt has 11 siblings in all   Social Determinants of Health   Financial Resource Strain: Not on file  Food Insecurity: No Food Insecurity   Worried About Charity fundraiser in the Last Year: Never true   [place] in the Last Year: Never true  Transportation Needs: Unknown   Lack of Transportation (Medical): No   Lack of Transportation (Non-Medical): Not on file  Physical Activity: Not on file  Stress: Not on file  Social Connections: Not on file     Family History: The patient's family history includes Cancer in her  sister; Heart disease in her mother.  ROS:   Please see the history of present illness.    (+) Shortness of breath (+) Orthopnea (+) Snores (+) LE edema (+) Chest pressure (+) Dizziness All other systems reviewed and are negative.  EKGs/Labs/Other Studies Reviewed:    The following studies were reviewed today:  LE Venous DVT 01/09/2021: Summary:  BILATERAL:  - No evidence of deep vein thrombosis seen in the lower extremities,  bilaterally.  -No evidence of popliteal cyst, bilaterally.   Echo 01/03/2020:  1. Left ventricular ejection fraction,  by estimation, is 50 to 55%. The  left ventricle has low normal function. The left ventricle has no regional  wall motion abnormalities. There is mild concentric left ventricular  hypertrophy. Left ventricular  diastolic parameters are consistent with Grade I diastolic dysfunction  (impaired relaxation).   2. Right ventricular systolic function is normal. The right ventricular  size is normal. There is mildly elevated pulmonary artery systolic  pressure.   3. Left atrial size was moderately dilated.   4. Right atrial size was mildly dilated.   5. The mitral valve is degenerative. Mild to moderate mitral valve  regurgitation.   6. Tricuspid valve regurgitation is moderate.   7. The aortic valve is tricuspid. Aortic valve regurgitation is not  visualized. Mild aortic valve sclerosis is present, with no evidence of  aortic valve stenosis.   8. There is mild (Grade II) atheroma plaque involving the aortic root and  ascending aorta.   9. The inferior vena cava is normal in size with greater than 50%  respiratory variability, suggesting right atrial pressure of 3 mmHg.   NM Myoview 06/05/2019: IMPRESSION: 1. No reversible ischemia or infarction.   2. Normal left ventricular wall motion.   3. Left ventricular ejection fraction 63%   4. Non invasive risk stratification*: Low  EKG:    05/16/2021: EKG was not ordered today.  Recent  Labs: 01/10/2021: Magnesium 1.7 04/28/2021: ALT 14; B Natriuretic Peptide 28.6; BUN 17; Creatinine, Ser 1.57; Hemoglobin 11.4; Platelets 247; Potassium 5.4; Sodium 137   Recent Lipid Panel    Component Value Date/Time   CHOL 213 (H) 01/09/2021 0053   TRIG 32 01/09/2021 0053   HDL 62 01/09/2021 0053   CHOLHDL 3.4 01/09/2021 0053   VLDL 6 01/09/2021 0053   LDLCALC 145 (H) 01/09/2021 0053     Risk Assessment/Calculations:    CHA2DS2-VASc Score = 8   This indicates a 10.8% annual risk of stroke. The patient's score is based upon: CHF History: 1 HTN History: 1 Diabetes History: 0 Stroke History: 2 Vascular Disease History: 1 Age Score: 2 Gender Score: 1    STOP-Bang Score:  5       Physical Exam:    Wt Readings from Last 3 Encounters:  05/16/21 215 lb 4.8 oz (97.7 kg)  04/28/21 222 lb (100.7 kg)  02/15/21 223 lb 1.6 oz (101.2 kg)     VS:  BP (!) 95/57   Pulse 63   Ht 5\' 7"  (1.702 m)   Wt 215 lb 4.8 oz (97.7 kg)   SpO2 95%   BMI 33.72 kg/m  , BMI Body mass index is 33.72 kg/m. GENERAL:  Well appearing HEENT: Pupils equal round and reactive, fundi not visualized, oral mucosa unremarkable NECK:  No jugular venous distention, waveform within normal limits, carotid upstroke brisk and symmetric, no bruits, no thyromegaly LYMPHATICS:  No cervical adenopathy LUNGS:  Clear to auscultation bilaterally HEART:  RRR.  PMI not displaced or sustained,S1 and S2 within normal limits, no S3, no S4, no clicks, no rubs, II/VI systolic murmur at the LUSB ABD:  Flat, positive bowel sounds normal in frequency in pitch, no bruits, no rebound, no guarding, no midline pulsatile mass, no hepatomegaly, no splenomegaly EXT:  2 plus pulses throughout, trnce edema with tenderness./, no cyanosis no clubbing SKIN:  No rashes no nodules NEURO:  Cranial nerves II through XII grossly intact, motor grossly intact throughout PSYCH:  Cognitively intact, oriented to person place and time   ASSESSMENT:  1. Apnea   2. Chronic diastolic heart failure ([place])   3. Essential hypertension   4. Paroxysmal atrial fibrillation (HCC)   5. Shortness of breath    PLAN:    Chronic diastolic heart failure [facility]) [name] has a history of diastolic heart failure.  Seems like her lower extremity edema is more related to venous insufficiency.  Her JVP is not elevated.  We did discuss wearing compression socks.  At this time she otherwise appears euvolemic.  She does report shortness of breath.  It occurs with exertion.  We will get a Lexiscan Myoview to assess for ischemia.  Essential hypertension Blood pressure today is low.  She will reduce valsartan to 80 mg.  Continue metoprolol, spironolactone, and torsemide.  She will track her blood pressures and bring to follow-up.  Atrial fibrillation ([place]) She is currently in sinus rhythm.  Continue metoprolol and warfarin.  Shortness of breath [name] has shortness of breath and exertional chest pressure.  In the past it has been attributable to diastolic heart failure.  BNP was not elevated when she went to the hospital with shortness of breath, though this can be misleading in the setting of obesity.  She appears relatively euvolemic on more than trace edema but seems more related to venous insufficiency than heart failure.  We will get a Lexiscan Myoview to evaluate for ischemia.  She walks with a cane and is unable to walk on a treadmill.  We will also get a sleep study to assess for sleep apnea as she reports snoring and daytime somnolence.    Shared Decision Making/Informed Consent The risks [chest pain, shortness of breath, cardiac arrhythmias, dizziness, blood pressure fluctuations, myocardial infarction, stroke/transient ischemic attack, nausea, vomiting, allergic reaction, radiation exposure, metallic taste sensation and life-threatening complications (estimated to be 1 in 10,000)], benefits (risk stratification, diagnosing coronary artery disease,  treatment guidance) and alternatives of a nuclear stress test were discussed in detail with [name] and she agrees to proceed.    Disposition: FU with Brayant Dorr C. Oval Linsey, MD, [facility] in 3 months.  Medication Adjustments/Labs and Tests Ordered: Current medicines are reviewed at length with the patient today.  Concerns regarding medicines are outlined above.   No orders of the defined types were placed in this encounter.  No orders of the defined types were placed in this encounter.   Patient Instructions  Medication Instructions:  DECREASE YOUR VALSARTAN TO 80 MG DAILY   *If you need a refill on your cardiac medications before your next appointment, please call your pharmacy*  Lab Work: NONE  Testing/Procedures: Your physician has requested that you have a lexiscan myoview. For further information please visit [email or web address]. Please follow instruction sheet, as given.  Your physician has recommended that you have a sleep study. This test records several body functions during sleep, including: brain activity, eye movement, oxygen and carbon dioxide blood levels, heart rate and rhythm, breathing rate and rhythm, the flow of air through your mouth and nose, snoring, body muscle movements, and chest and belly movement. THE OFFICE WILL CALL YOU TO SCHEDULE ONCE INSURANCE HAS BEEN REVIEWED   Follow-Up: At [facility], you and your health needs are our priority.  As part of our continuing mission to provide you with exceptional heart care, we have created designated Provider Care Teams.  These Care Teams include your primary Cardiologist (physician) and Advanced Practice Providers (APPs -  Physician Assistants and Nurse Practitioners) who all work together to provide you with the care  you need, when you need it.  We recommend signing up for the patient portal called "MyChart".  Sign up information is provided on this After Visit Summary.  MyChart is used to connect with patients for  Virtual Visits (Telemedicine).  Patients are able to view lab/test results, encounter notes, upcoming appointments, etc.  Non-urgent messages can be sent to your provider as well.   To learn more about what you can do with MyChart, go to [email or web address].    Your next appointment:   2 month(s)  The format for your next appointment:   In Person  Provider:   Skeet Latch, MD [place] NP   Other Instructions  MONITOR YOUR BLOOD PRESSURE DAILY, LOG AND BRING YOUR READINGS TO [place]     I,Mathew Stumpf,acting as a scribe for Skeet Latch, MD.,have documented all relevant documentation on the behalf of Skeet Latch, MD,as directed by  Skeet Latch, MD while in the presence of Skeet Latch, MD.   I, [place] Oval Linsey, MD have reviewed all documentation for this visit.  The documentation of the exam, diagnosis, procedures, and orders on 05/16/2021 are all accurate and complete.   Signed, Skeet Latch, MD  05/16/2021 3:29 PM    [place]

## 2021-05-16 NOTE — Patient Instructions (Signed)
Medication Instructions:  DECREASE YOUR VALSARTAN TO 80 MG DAILY   *If you need a refill on your cardiac medications before your next appointment, please call your pharmacy*  Lab Work: NONE  Testing/Procedures: Your physician has requested that you have a lexiscan myoview. For further information please visit [email or web address]. Please follow instruction sheet, as given.  Your physician has recommended that you have a sleep study. This test records several body functions during sleep, including: brain activity, eye movement, oxygen and carbon dioxide blood levels, heart rate and rhythm, breathing rate and rhythm, the flow of air through your mouth and nose, snoring, body muscle movements, and chest and belly movement. THE OFFICE WILL CALL YOU TO SCHEDULE ONCE INSURANCE HAS BEEN REVIEWED   Follow-Up: At [facility], you and your health needs are our priority.  As part of our continuing mission to provide you with exceptional heart care, we have created designated Provider Care Teams.  These Care Teams include your primary Cardiologist (physician) and Advanced Practice Providers (APPs -  Physician Assistants and Nurse Practitioners) who all work together to provide you with the care you need, when you need it.  We recommend signing up for the patient portal called "MyChart".  Sign up information is provided on this After Visit Summary.  MyChart is used to connect with patients for Virtual Visits (Telemedicine).  Patients are able to view lab/test results, encounter notes, upcoming appointments, etc.  Non-urgent messages can be sent to your provider as well.   To learn more about what you can do with MyChart, go to [email or web address].    Your next appointment:   2 month(s)  The format for your next appointment:   In Person  Provider:   Skeet Latch, MD OR Laurann [place] NP   Other Instructions  MONITOR YOUR BLOOD PRESSURE DAILY, LOG AND BRING YOUR READINGS TO [place]

## 2021-05-16 NOTE — Assessment & Plan Note (Signed)
Blood pressure today is low.  She will reduce valsartan to 80 mg.  Continue metoprolol, spironolactone, and torsemide.  She will track her blood pressures and bring to follow-up.

## 2021-05-16 NOTE — Assessment & Plan Note (Signed)
She is currently in sinus rhythm.  Continue metoprolol and warfarin.

## 2021-05-16 NOTE — Assessment & Plan Note (Signed)
[name] has shortness of breath and exertional chest pressure.  In the past it has been attributable to diastolic heart failure.  BNP was not elevated when she went to the hospital with shortness of breath, though this can be misleading in the setting of obesity.  She appears relatively euvolemic on more than trace edema but seems more related to venous insufficiency than heart failure.  We will get a Lexiscan Myoview to evaluate for ischemia.  She walks with a cane and is unable to walk on a treadmill.  We will also get a sleep study to assess for sleep apnea as she reports snoring and daytime somnolence.

## 2021-05-16 NOTE — Assessment & Plan Note (Signed)
Ms. Dobrowolski has a history of diastolic heart failure.  Seems like her lower extremity edema is more related to venous insufficiency.  Her JVP is not elevated.  We did discuss wearing compression socks.  At this time she otherwise appears euvolemic.  She does report shortness of breath.  It occurs with exertion.  We will get a Lexiscan Myoview to assess for ischemia.

## 2021-05-17 ENCOUNTER — Telehealth (HOSPITAL_COMMUNITY): Payer: Self-pay | Admitting: *Deleted

## 2021-05-17 NOTE — Telephone Encounter (Signed)
Patient given detailed instructions per Myocardial Perfusion Study Information Sheet for the test on 05/23/21 at 10:45. Patient notified to arrive 15 minutes early and that it is imperative to arrive on time for appointment to keep from having the test rescheduled.  If you need to cancel or reschedule your appointment, please call the office within 24 hours of your appointment. . Patient verbalized understanding.[name]

## 2021-05-18 DIAGNOSIS — N1832 Chronic kidney disease, stage 3b: Secondary | ICD-10-CM | POA: Diagnosis not present

## 2021-05-18 DIAGNOSIS — I442 Atrioventricular block, complete: Secondary | ICD-10-CM | POA: Diagnosis not present

## 2021-05-18 DIAGNOSIS — J439 Emphysema, unspecified: Secondary | ICD-10-CM | POA: Diagnosis not present

## 2021-05-18 DIAGNOSIS — I890 Lymphedema, not elsewhere classified: Secondary | ICD-10-CM | POA: Diagnosis not present

## 2021-05-18 DIAGNOSIS — D631 Anemia in chronic kidney disease: Secondary | ICD-10-CM | POA: Diagnosis not present

## 2021-05-18 DIAGNOSIS — I480 Paroxysmal atrial fibrillation: Secondary | ICD-10-CM | POA: Diagnosis not present

## 2021-05-18 DIAGNOSIS — I130 Hypertensive heart and chronic kidney disease with heart failure and stage 1 through stage 4 chronic kidney disease, or unspecified chronic kidney disease: Secondary | ICD-10-CM | POA: Diagnosis not present

## 2021-05-18 DIAGNOSIS — I479 Paroxysmal tachycardia, unspecified: Secondary | ICD-10-CM | POA: Diagnosis not present

## 2021-05-18 DIAGNOSIS — I5032 Chronic diastolic (congestive) heart failure: Secondary | ICD-10-CM | POA: Diagnosis not present

## 2021-05-23 ENCOUNTER — Encounter (HOSPITAL_COMMUNITY): Payer: Self-pay | Admitting: Cardiovascular Disease

## 2021-05-23 ENCOUNTER — Ambulatory Visit (HOSPITAL_COMMUNITY): Payer: Medicare HMO | Attending: Cardiology

## 2021-05-24 ENCOUNTER — Other Ambulatory Visit: Payer: Self-pay

## 2021-05-25 ENCOUNTER — Ambulatory Visit (INDEPENDENT_AMBULATORY_CARE_PROVIDER_SITE_OTHER): Payer: Medicare HMO

## 2021-05-25 DIAGNOSIS — Z7901 Long term (current) use of anticoagulants: Secondary | ICD-10-CM | POA: Diagnosis not present

## 2021-05-25 LAB — POCT INR: INR: 4.1 — AB (ref 2.0–3.0)

## 2021-05-25 NOTE — Patient Instructions (Addendum)
Pre visit review using our clinic review tool, if applicable. No additional management support is needed unless otherwise documented below in the visit note.  Hold dose today and then change weekly dose to take 1/2 tablet daily except take 1 tablet on Mondays  Recheck in 2 wks.

## 2021-05-30 ENCOUNTER — Other Ambulatory Visit: Payer: Self-pay

## 2021-05-30 NOTE — Patient Outreach (Signed)
[facility]) Care Management  05/30/2021  Zhanna Melin 18-May-1941 [id number]   Telephone Assessment   Unsuccessful outreach attempt to patient. No answer after several rings.   Plan: RN CM will make outreach attempt to patient within the month of Dec if no return call.   [name], RN,BSN,CCM [place] Management Telephonic Care Management Coordinator Direct Phone: [phone] Toll Free: [phone] Fax: [phone]

## 2021-05-31 ENCOUNTER — Telehealth: Payer: Self-pay | Admitting: Internal Medicine

## 2021-05-31 NOTE — Telephone Encounter (Signed)
I spoke with Mardene Celeste and a follow up visit was scheduled for 11/02 with PCP

## 2021-05-31 NOTE — Telephone Encounter (Signed)
Please see other telephone encounter.

## 2021-05-31 NOTE — Telephone Encounter (Signed)
Pt bp readings are on 10/17 Pt bp 120/77, 10/18 bp 113/59 ,10/19 bp 107/58 , 10/20 bp 99/56 ,10/21 bp  93/53, 10/22 bp 100/58, 10/23 bp 100/58 ,10/24 bp  99/56 and 10/25 bp 93/56. Pt daughter [name] is LPN and is calling to let dr Jerilee Hoh know she has stop all bp med except torsemide 10 mg she is not giving to her mother torsemide 10 mg  everyday only mon, wed and Friday. Please advise

## 2021-05-31 NOTE — Telephone Encounter (Signed)
Left a message for Irene Pap to return my call per pt request to schedule appt.

## 2021-05-31 NOTE — Telephone Encounter (Signed)
Irene Pap, pt's daughter, is returning a call from [place].  You can call her back at the same number from this morning.

## 2021-06-01 ENCOUNTER — Ambulatory Visit: Payer: Self-pay

## 2021-06-02 ENCOUNTER — Telehealth (HOSPITAL_COMMUNITY): Payer: Self-pay | Admitting: Cardiovascular Disease

## 2021-06-02 NOTE — Telephone Encounter (Signed)
Just an FYI. We have made several attempts to contact this patient including sending a letter to schedule or reschedule their Myoview. We will be removing the patient from the echo/nuc  WQ.    05/23/21 NO SHOWED-MAILED LETTER [facility]    Thank you

## 2021-06-07 ENCOUNTER — Other Ambulatory Visit: Payer: Self-pay

## 2021-06-08 ENCOUNTER — Encounter: Payer: Self-pay | Admitting: Internal Medicine

## 2021-06-08 ENCOUNTER — Ambulatory Visit (INDEPENDENT_AMBULATORY_CARE_PROVIDER_SITE_OTHER): Payer: Medicare HMO

## 2021-06-08 ENCOUNTER — Ambulatory Visit (INDEPENDENT_AMBULATORY_CARE_PROVIDER_SITE_OTHER): Payer: Medicare HMO | Admitting: Internal Medicine

## 2021-06-08 VITALS — BP 110/64 | HR 64 | Temp 98.0°F | Wt 215.0 lb

## 2021-06-08 DIAGNOSIS — I480 Paroxysmal atrial fibrillation: Secondary | ICD-10-CM | POA: Diagnosis not present

## 2021-06-08 DIAGNOSIS — R0602 Shortness of breath: Secondary | ICD-10-CM

## 2021-06-08 DIAGNOSIS — Z7901 Long term (current) use of anticoagulants: Secondary | ICD-10-CM

## 2021-06-08 DIAGNOSIS — Z23 Encounter for immunization: Secondary | ICD-10-CM | POA: Diagnosis not present

## 2021-06-08 DIAGNOSIS — I10 Essential (primary) hypertension: Secondary | ICD-10-CM | POA: Diagnosis not present

## 2021-06-08 LAB — POCT INR: INR: 1.7 — AB (ref 2.0–3.0)

## 2021-06-08 NOTE — Progress Notes (Signed)
Established Patient Office Visit     This visit occurred during the SARS-CoV-2 public health emergency.  Safety protocols were in place, including screening questions prior to the visit, additional usage of staff PPE, and extensive cleaning of exam room while observing appropriate contact time as indicated for disinfecting solutions.    CC/Reason for Visit: Follow-up chronic conditions  HPI: [name] is a 80 y.o. female who is coming in today for the above mentioned reasons. Past Medical History is significant for:  hypertension, hyperlipidemia, morbid obesity, complete heart block status post permanent pacemaker followed by Dr. Lovena Le, heart failure with preserved ejection fraction, atrial fibrillation chronically anticoagulated on warfarin, she also has COPD/emphysema and bilateral lymphedema.  She has been dealing with dyspnea on exertion for some time.  It appears that when she last saw Dr. Oval Linsey a [place] Adam Myoview was ordered, however they were unable able to contact the patient to get her scheduled.  The patient and son state today they never received any phone calls, son was unaware that a stress test had been planned for.  She is requesting a flu vaccine today.  Since I last saw her her valsartan dose was cut in half due to significant issues with low blood pressure.  Her in office blood pressure today is 110/64.   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   rxd 3 yrs meds, no surgery in kidneys    Past Surgical History:  Procedure Laterality Date   ABDOMINAL HYSTERECTOMY     BREAST BIOPSY  04/30/2012    Procedure: BREAST BIOPSY WITH NEEDLE LOCALIZATION;  Surgeon: Rolm Bookbinder, MD;  Location: [facility];  Service: General;  Laterality: Left;  Left breast wire localization biospy   HEMORRHOID SURGERY     PACEMAKER INSERTION  02-06-2011   PPM GENERATOR CHANGEOUT N/A 04/19/2020   Procedure: PPM GENERATOR CHANGEOUT;  Surgeon: Evans Lance, MD;  Location: [facility];  Service: Cardiovascular;  Laterality: N/A;   TAH and BSO     TONSILLECTOMY      Social History:  reports that she quit smoking about 43 years ago. Her smoking use included cigarettes. She has a 4.00 pack-year smoking history. She has never used smokeless tobacco. She reports that she does not drink alcohol and does not use drugs.  Allergies: Allergies  Allergen Reactions   Albuterol Palpitations and Other (See Comments)    irregular heartbeat    Family History:  Family History  Problem Relation Age of Onset   Heart disease Mother    Cancer Sister        breast     Current Outpatient Medications:    atorvastatin (LIPITOR) 20 MG tablet, Take 1 tablet (20 mg total) by mouth daily at 6 PM., Disp: 90 tablet, Rfl: 0   budesonide-formoterol (SYMBICORT) 80-4.5 MCG/ACT inhaler, Inhale 2 puffs into the lungs 2 (two) times daily., Disp: 3 each, Rfl: 1   levalbuterol (XOPENEX HFA) 45 MCG/ACT inhaler, Inhale 2 puffs into  the lungs every 4 (four) hours as needed for wheezing., Disp: 3 Inhaler, Rfl: 1   metoprolol tartrate (LOPRESSOR) 25 MG tablet, Take 0.5 tablets (12.5 mg total) by mouth 2 (two) times daily., Disp: 180 tablet, Rfl: 0   montelukast (SINGULAIR) 10 MG tablet, TAKE 1 TABLET BY MOUTH AT BEDTIME EVERY NIGHT, Disp: 90 tablet, Rfl: 0   omeprazole (PRILOSEC) 20 MG capsule, Take 1 capsule (20 mg total) by mouth daily. Take 30-60 min before first meal of the day, Disp: 90 capsule, Rfl: 0   potassium chloride (KLOR-CON) 10 MEQ tablet, Take 1 tablet (10 mEq total) by mouth daily., Disp: 90 tablet, Rfl: 0   spironolactone  (ALDACTONE) 25 MG tablet, Take 0.5 tablets (12.5 mg total) by mouth daily., Disp: 90 tablet, Rfl: 0   torsemide (DEMADEX) 10 MG tablet, Take 1 tablet (10 mg total) by mouth every Monday, Wednesday, and Friday., Disp: 90 tablet, Rfl: 0   valsartan (DIOVAN) 80 MG tablet, Take 1 tablet (80 mg total) by mouth daily. (Patient taking differently: Take 80 mg by mouth daily. Take half tab daily), Disp: 90 tablet, Rfl: 3   warfarin (COUMADIN) 5 MG tablet, Take 1/2 tablet daily or take as directed by anticoagulation clinic, Disp: 60 tablet, Rfl: 1  Review of Systems:  Constitutional: Denies fever, chills, diaphoresis, appetite change and fatigue.  HEENT: Denies photophobia, eye pain, redness, hearing loss, ear pain, congestion, sore throat, rhinorrhea, sneezing, mouth sores, trouble swallowing, neck pain, neck stiffness and tinnitus.   Respiratory: Denies  cough, chest tightness,  and wheezing.   Cardiovascular: Denies chest pain, palpitations and leg swelling.  Gastrointestinal: Denies nausea, vomiting, abdominal pain, diarrhea, constipation, blood in stool and abdominal distention.  Genitourinary: Denies dysuria, urgency, frequency, hematuria, flank pain and difficulty urinating.  Endocrine: Denies: hot or cold intolerance, sweats, changes in hair or nails, polyuria, polydipsia. Musculoskeletal: Denies myalgias, back pain, joint swelling, arthralgias and gait problem.  Skin: Denies pallor, rash and wound.  Neurological: Denies dizziness, seizures, syncope, weakness, light-headedness, numbness and headaches.  Hematological: Denies adenopathy. Easy bruising, personal or family bleeding history  Psychiatric/Behavioral: Denies suicidal ideation, mood changes, confusion, nervousness, sleep disturbance and agitation    Physical Exam: Vitals:   06/08/21 1436  BP: 110/64  Pulse: 64  Temp: 98 F (36.7 C)  TempSrc: Oral  SpO2: 98%  Weight: 215 lb (97.5 kg)    Body mass index is 33.67  kg/m.   Constitutional: NAD, calm, comfortable morbidly obese, ambulates with a cane Eyes: PERRL, lids and conjunctivae normal ENMT: Mucous membranes are moist.  Respiratory: clear to auscultation bilaterally, no wheezing, no crackles. Normal respiratory effort. No accessory muscle use.  Cardiovascular: Regular rate and rhythm, no murmurs / rubs / gallops.  3+ nonpitting lower extremity edema Psychiatric: Normal judgment and insight. Alert and oriented x 3. Normal mood.    Impression and Plan:  Need for influenza vaccination -Flu vaccine administered today.  Paroxysmal atrial fibrillation (HCC) Long term (current) use of anticoagulants -She will have her Coumadin checked in clinic today.  She appears to be in sinus rhythm.  Essential hypertension -Blood pressure appears to be well controlled.  Obesity, Class III, BMI 40-49.9 (morbid obesity) ([place]) -Discussed healthy lifestyle, including increased physical activity and better food choices to promote weight loss.  Shortness of breath -She will be given cardiology offices phone number to reschedule Lexiscan Myoview.  Time spent: 33 minutes reviewing chart, interviewing and examining patient and formulating plan of care.   Patient Instructions  -  Nice seeing you today!!  -Flu vaccine today.  -Remember to get your COVID booster at the pharmacy.  -Please contact cardiology office to reschedule your stress test.  -Schedule follow up in 3 months for your physical. Please come in fasting that day.    Lelon Frohlich, MD [facility]

## 2021-06-08 NOTE — Patient Instructions (Addendum)
Pre visit review using our clinic review tool, if applicable. No additional management support is needed unless otherwise documented below in the visit note.  Take one full tablet today and then continue 1/2 tablet daily except take 1 tablet on Mondays  Recheck in 3  wks.

## 2021-06-08 NOTE — Addendum Note (Signed)
Addended by: Westley Hummer B on: 06/08/2021 05:38 PM   Modules accepted: Orders

## 2021-06-08 NOTE — Patient Instructions (Signed)
-  Nice seeing you today!!  -Flu vaccine today.  -Remember to get your COVID booster at the pharmacy.  -Please contact cardiology office to reschedule your stress test.  -Schedule follow up in 3 months for your physical. Please come in fasting that day.

## 2021-06-08 NOTE — Progress Notes (Signed)
Take one full tablet today and then continue 1/2 tablet daily except take 1 tablet on Mondays  Recheck in 3  wks.

## 2021-06-15 ENCOUNTER — Other Ambulatory Visit: Payer: Self-pay | Admitting: Internal Medicine

## 2021-06-15 DIAGNOSIS — J4530 Mild persistent asthma, uncomplicated: Secondary | ICD-10-CM

## 2021-06-23 ENCOUNTER — Other Ambulatory Visit: Payer: Self-pay | Admitting: Internal Medicine

## 2021-06-29 ENCOUNTER — Ambulatory Visit (INDEPENDENT_AMBULATORY_CARE_PROVIDER_SITE_OTHER): Payer: Medicare HMO

## 2021-06-29 DIAGNOSIS — Z7901 Long term (current) use of anticoagulants: Secondary | ICD-10-CM

## 2021-06-29 LAB — POCT INR: INR: 2.5 (ref 2.0–3.0)

## 2021-06-29 NOTE — Patient Instructions (Addendum)
Pre visit review using our clinic review tool, if applicable. No additional management support is needed unless otherwise documented below in the visit note.  Continue 1/2 tablet daily except take 1 tablet on Mondays  Recheck in 5  wks.

## 2021-06-29 NOTE — Progress Notes (Addendum)
Continue 1/2 tablet daily except take 1 tablet on Mondays  Recheck in 5  wks per pt request.

## 2021-07-06 ENCOUNTER — Other Ambulatory Visit: Payer: Self-pay

## 2021-07-06 NOTE — Patient Outreach (Signed)
[facility]) Care Management  07/06/2021  [name] [birth year] [id number]   Telephone Assessment  Successful outreach call placed to patient. She reports she is doing fairly well-currently still in bed resting and watching tv. She continues to reside in her home alone. She has supportive family(son and nephew) who helps out as needed. Appetite remains good. Wgt stable. She saw PCP earlier this month. Denies any recent falls. She is using DME as needed. She denies any RN CM needs or concerns at this time.     Medications Reviewed Today     Reviewed by Hayden Pedro, RN (Registered Nurse) on 07/06/21 at 1217  Med List Status: <None>   Medication Order Taking? Sig Documenting Provider Last Dose Status Informant  atorvastatin (LIPITOR) 20 MG tablet [id number] No Take 1 tablet (20 mg total) by mouth daily at 6 PM. Isaac Bliss, Rayford Halsted, MD Taking Active   levalbuterol [facility] HFA) 45 MCG/ACT inhaler [id number] No Inhale 2 puffs into the lungs every 4 (four) hours as needed for wheezing. Lauraine Rinne, NP Taking Active Self  metoprolol tartrate (LOPRESSOR) 25 MG tablet [id number] No Take 0.5 tablets (12.5 mg total) by mouth 2 (two) times daily. Isaac Bliss, Rayford Halsted, MD Taking Active   montelukast (SINGULAIR) 10 MG tablet [id number] No TAKE 1 TABLET BY MOUTH AT BEDTIME EVERY NIGHT Isaac Bliss, Rayford Halsted, MD Taking Active   omeprazole (PRILOSEC) 20 MG capsule [id number]  TAKE 1 CAPSULE  BY MOUTH DAILY. TAKE 30 TO [place] OF THE DAY Isaac Bliss, Rayford Halsted, MD  Active   potassium chloride (KLOR-CON) 10 MEQ tablet [id number]  TAKE 1 TABLET EVERY DAY Isaac Bliss, Rayford Halsted, MD  Active   spironolactone (ALDACTONE) 25 MG tablet [id number] No Take 0.5 tablets (12.5 mg total) by mouth daily. Isaac Bliss, Rayford Halsted, MD Taking Active   SYMBICORT 80-4.5 MCG/ACT inhaler [id number]  INHALE 2 PUFFS INTO THE LUNGS 2 (TWO) TIMES DAILY.  Isaac Bliss, Rayford Halsted, MD  Active   torsemide (DEMADEX) 10 MG tablet [id number]  TAKE 1 TABLET BY MOUTH EVERY MONDAY, WEDNESDAY, AND FRIDAY (APPOINTMENT IS NEEDED) Isaac Bliss, Rayford Halsted, MD  Active   valsartan (DIOVAN) 80 MG tablet [id number] No Take 1 tablet (80 mg total) by mouth daily.  Patient taking differently: Take 80 mg by mouth daily. Take half tab daily   Skeet Latch, MD Taking Active   warfarin (COUMADIN) 5 MG tablet [id number] No Take 1/2 tablet daily or take as directed by anticoagulation clinic Isaac Bliss, Rayford Halsted, MD Taking Active              Care Plan : Atrial Fibrillation (Adult)  Updates made by Hayden Pedro, RN since 07/06/2021 12:00 AM     Problem: Dysrhythmia (Atrial Fibrillation)      Long-Range Goal: Heart Rate and Rhythm Monitored and Managed Completed 07/06/2021  Start Date: 12/17/2020  Expected End Date: 07/06/2021  Recent Progress: On track  Priority: High  Note:    Notes:   12/17/2020 RN CM educated patient on the importance of med adherence. RN CM discussed s/s of stroke and when to seek medical attention. RN CM confirmed pt/caregiver has action plan in place and knows how and when to seek medical attention. RN CM assessed for med adherence and any barriers.  03/30/21 RN CM assessed for any  cute issues or sxs. RN CM confirmed MD appt in place.  [id number] due to duplicate care plan  and goals    Task: Alleviate Barriers to Dysrhythmia Management Completed 07/06/2021  Note:   Care Management Activities:    - medication-adherence assessment completed - medication side effects monitored and managed    Notes:     Care Plan : RN Care Manager POC  Updates made by Hayden Pedro, RN since 07/06/2021 12:00 AM     Problem: Chronic Disease Mgmt of Chronic Conditions-A-fib,HTN   Priority: High     Long-Range Goal: Development of POC for Mgmt of Chronic Conditions-A-fib,HTN   Start  Date: 07/06/2021  Expected End Date: 07/06/2022  Priority: High  Note:   Current Barriers:  Chronic Disease Management support and education needs related to Atrial Fibrillation and HTN   RNCM Clinical Goal(s):  Patient will verbalize basic understanding of  Atrial Fibrillation and HTN disease process and self health management plan as evidenced by mgmt of chronic conditions attend all scheduled medical appointments:   as evidenced by attending PCP and INR checks appts demonstrate Ongoing adherence to prescribed treatment plan for Atrial Fibrillation and HTN as evidenced by therapeutic INR level  2-3 continue to work with RN Care Manager to address care management and care coordination needs related to  Atrial Fibrillation and HTN as evidenced by adherence to CM Team Scheduled appointments through collaboration with RN Care manager, provider, and care team.   Interventions: POC sent to PCP upon initial assessment, quarterly and with any changes in patient's conditions Inter-disciplinary care team collaboration (see longitudinal plan of care) Evaluation of current treatment plan related to  self management and patient's adherence to plan as established by provider   AFIB Interventions: (Status:  New goal.) Long Term Goal   Reviewed importance of adherence to anticoagulant exactly as prescribed Counseled on importance of regular laboratory monitoring as prescribed   Hypertension Interventions:  (Status:  New goal.) Long Term Goal Last practice recorded BP readings:  BP Readings from Last 3 Encounters:  06/08/21 110/64  05/16/21 (!) 95/57  04/29/21 (!) 125/59  Most recent eGFR/CrCl: No results found for: EGFR  No components found for: CRCL  Discussed plans with patient for ongoing care management follow up and provided patient with direct contact information for care management team Provided education on prescribed diet low salt  Patient Goals/Self-Care Activities: Take all  medications as prescribed Attend all scheduled provider appointments Call provider office for new concerns or questions  check pulse (heart) rate once a day keep all lab appointments learn about high blood pressure keep all doctor appointments take medications for blood pressure exactly as prescribed  Follow Up Plan:  Telephone follow up appointment with care management team member scheduled for:  quarterly-within the month of Feb The patient has been provided with contact information for the care management team and has been advised to call with any health related questions or concerns.        Plan: RN CM will send quarterly update to PCP. RN CM discussed with patient next outreach within the month of Feb. Patient agrees to care plan and follow up.  [name], RN,BSN,CCM [facility] Telephonic Care Management Coordinator Direct Phone: [phone] Toll Free: [phone] Fax: [phone]

## 2021-07-08 ENCOUNTER — Ambulatory Visit: Payer: Self-pay

## 2021-07-18 ENCOUNTER — Ambulatory Visit (INDEPENDENT_AMBULATORY_CARE_PROVIDER_SITE_OTHER): Payer: Medicare HMO

## 2021-07-18 ENCOUNTER — Ambulatory Visit (HOSPITAL_BASED_OUTPATIENT_CLINIC_OR_DEPARTMENT_OTHER): Payer: Medicare HMO | Admitting: Cardiovascular Disease

## 2021-07-18 DIAGNOSIS — I495 Sick sinus syndrome: Secondary | ICD-10-CM

## 2021-07-18 LAB — CUP PACEART REMOTE DEVICE CHECK
Battery Remaining Longevity: 101 mo
Battery Remaining Percentage: 90 %
Battery Voltage: 3.01 V
Brady Statistic AP VP Percent: 35 %
Brady Statistic AP VS Percent: 1 %
Brady Statistic AS VP Percent: 65 %
Brady Statistic AS VS Percent: 1 %
Brady Statistic RA Percent Paced: 33 %
Brady Statistic RV Percent Paced: 99 %
Date Time Interrogation Session: 20221212020016
Implantable Lead Implant Date: 20120702
Implantable Lead Implant Date: 20120702
Implantable Lead Location: 753859
Implantable Lead Location: 753860
Implantable Pulse Generator Implant Date: 20210913
Lead Channel Impedance Value: 380 Ohm
Lead Channel Impedance Value: 410 Ohm
Lead Channel Pacing Threshold Amplitude: 0.75 V
Lead Channel Pacing Threshold Amplitude: 1 V
Lead Channel Pacing Threshold Pulse Width: 0.4 ms
Lead Channel Pacing Threshold Pulse Width: 0.5 ms
Lead Channel Sensing Intrinsic Amplitude: 11.4 mV
Lead Channel Sensing Intrinsic Amplitude: 4.4 mV
Lead Channel Setting Pacing Amplitude: 1.25 V
Lead Channel Setting Pacing Amplitude: 1.75 V
Lead Channel Setting Pacing Pulse Width: 0.5 ms
Lead Channel Setting Sensing Sensitivity: 4 mV
Pulse Gen Model: 2272
Pulse Gen Serial Number: 3864080

## 2021-07-21 ENCOUNTER — Other Ambulatory Visit: Payer: Self-pay | Admitting: Internal Medicine

## 2021-07-21 DIAGNOSIS — J4530 Mild persistent asthma, uncomplicated: Secondary | ICD-10-CM

## 2021-07-25 NOTE — Progress Notes (Signed)
Remote pacemaker transmission.   

## 2021-08-03 ENCOUNTER — Telehealth: Payer: Self-pay | Admitting: Internal Medicine

## 2021-08-03 ENCOUNTER — Ambulatory Visit: Payer: Medicare HMO

## 2021-08-03 NOTE — Telephone Encounter (Signed)
Pt daughter patricia said she will give her mother the coumadin like y'all discuss her appt is now on 08-15-2021

## 2021-08-03 NOTE — Telephone Encounter (Signed)
Noted  

## 2021-08-12 ENCOUNTER — Other Ambulatory Visit: Payer: Self-pay | Admitting: Internal Medicine

## 2021-08-15 ENCOUNTER — Other Ambulatory Visit: Payer: Self-pay | Admitting: Internal Medicine

## 2021-08-15 ENCOUNTER — Ambulatory Visit (INDEPENDENT_AMBULATORY_CARE_PROVIDER_SITE_OTHER): Payer: Medicare HMO

## 2021-08-15 ENCOUNTER — Ambulatory Visit: Payer: Medicare HMO

## 2021-08-15 DIAGNOSIS — Z7901 Long term (current) use of anticoagulants: Secondary | ICD-10-CM

## 2021-08-15 LAB — POCT INR: INR: 2.5 (ref 2.0–3.0)

## 2021-08-15 NOTE — Progress Notes (Signed)
Continue 1/2 tablet daily except take 1 tablet on Mondays  Recheck in 5  wks

## 2021-08-15 NOTE — Patient Instructions (Addendum)
Pre visit review using our clinic review tool, if applicable. No additional management support is needed unless otherwise documented below in the visit note.  Continue 1/2 tablet daily except take 1 tablet on Mondays  Recheck in 5  wks

## 2021-08-17 ENCOUNTER — Ambulatory Visit: Payer: Medicare HMO

## 2021-08-28 ENCOUNTER — Ambulatory Visit (HOSPITAL_BASED_OUTPATIENT_CLINIC_OR_DEPARTMENT_OTHER): Payer: Medicare HMO | Admitting: Cardiovascular Disease

## 2021-08-29 ENCOUNTER — Telehealth: Payer: Self-pay | Admitting: Internal Medicine

## 2021-08-29 NOTE — Telephone Encounter (Signed)
Daughter states [name] changed Torsemide to every day.  Patient needs a new prescription stating this--still has old dosage of 3 days a week which makes the patient run out quicker.   Pharmacy-- [facility]

## 2021-08-29 NOTE — Telephone Encounter (Signed)
Daughter notified of PCP response & verb understanding.

## 2021-08-29 NOTE — Telephone Encounter (Signed)
Last documentation on phone encounter from 05/31/21 states "she is not giving her mother torsemide 10mg  everyday only mon, wed, & Fri." Will send to PCP for dosing clarification.

## 2021-08-30 ENCOUNTER — Telehealth: Payer: Self-pay | Admitting: *Deleted

## 2021-08-30 NOTE — Telephone Encounter (Signed)
Received a teams message from [place] with the sleep lab informing me that due to a death in the family the patient had to reschedule her sleep study. The rescheduled date was outside of her [place]. A new PA was obtained by me. New PA # is [id number]. Valid 09/23/21 to 10/23/21.

## 2021-09-19 ENCOUNTER — Ambulatory Visit (INDEPENDENT_AMBULATORY_CARE_PROVIDER_SITE_OTHER): Payer: Medicare HMO

## 2021-09-19 DIAGNOSIS — Z7901 Long term (current) use of anticoagulants: Secondary | ICD-10-CM

## 2021-09-19 LAB — POCT INR: INR: 1.8 — AB (ref 2.0–3.0)

## 2021-09-19 NOTE — Progress Notes (Signed)
Increase dose today to take 1 1/2 table and then continue 1/2 tablet daily except take 1 tablet on Mondays  Recheck in 4 weeks.

## 2021-09-19 NOTE — Patient Instructions (Addendum)
Pre visit review using our clinic review tool, if applicable. No additional management support is needed unless otherwise documented below in the visit note.  Increase dose today to take 1 1/2 table and then continue 1/2 tablet daily except take 1 tablet on Mondays  Recheck in 4 weeks.

## 2021-09-22 ENCOUNTER — Other Ambulatory Visit: Payer: Self-pay

## 2021-09-22 NOTE — Patient Outreach (Signed)
[place] [facility]) Care Management  09/22/2021  [name] Aug 01, 1941 [id number]   Telephone Assessment     Successful outreach call placed to patient. He reports she is doing fairly well. She is going for a  sleep study this evening. She denies any RN CM needs or concerns at this time.    Medications Reviewed Today     Reviewed by Hayden Pedro, RN (Registered Nurse) on 09/22/21 at [phone]  Med List Status: <None>   Medication Order Taking? Sig Documenting Provider Last Dose Status Informant  atorvastatin (LIPITOR) 20 MG tablet [id number]  TAKE 1 TABLET (20 MG TOTAL) BY MOUTH DAILY AT 6 PM. Isaac Bliss, Rayford Halsted, MD  Active   levalbuterol [facility]) 45 MCG/ACT inhaler [id number] No Inhale 2 puffs into the lungs every 4 (four) hours as needed for wheezing. Lauraine Rinne, NP Taking Active Self  metoprolol tartrate (LOPRESSOR) 25 MG tablet [id number]  TAKE 1/2 TABLET TWICE DAILY Isaac Bliss, Rayford Halsted, MD  Active   montelukast (SINGULAIR) 10 MG tablet [id number]  TAKE 1 TABLET BY MOUTH AT BEDTIME EVERY NIGHT Isaac Bliss, Rayford Halsted, MD  Active   omeprazole (PRILOSEC) 20 MG capsule [id number]  TAKE 1 CAPSULE  BY MOUTH DAILY. TAKE 30 TO [place] OF THE DAY Isaac Bliss, Rayford Halsted, MD  Active   potassium chloride (KLOR-CON) 10 MEQ tablet [id number]  TAKE 1 TABLET EVERY DAY Isaac Bliss, Rayford Halsted, MD  Active   spironolactone (ALDACTONE) 25 MG tablet [id number]  TAKE 1/2 TABLET EVERY DAY Isaac Bliss, Rayford Halsted, MD  Active   SYMBICORT 80-4.5 MCG/ACT inhaler [id number]  INHALE 2 PUFFS INTO THE LUNGS 2 (TWO) TIMES DAILY. Isaac Bliss, Rayford Halsted, MD  Active   torsemide (DEMADEX) 10 MG tablet [id number]  TAKE 1 TABLET BY MOUTH EVERY MONDAY, WEDNESDAY, AND FRIDAY (APPOINTMENT IS NEEDED) Isaac Bliss, Rayford Halsted, MD  Active   valsartan (DIOVAN) 80 MG tablet [id number] No Take 1 tablet (80 mg total) by mouth daily.  Patient taking  differently: Take 80 mg by mouth daily. Take half tab daily   Skeet Latch, MD Taking Active   warfarin (COUMADIN) 5 MG tablet [id number]  Take 1/2 tablet daily or take as directed by anticoagulation clinic Isaac Bliss, Rayford Halsted, MD  Active             Care Plan : RN Care Manager POC  Updates made by Hayden Pedro, RN since 09/22/2021 12:00 AM     Problem: Chronic Disease Mgmt of Chronic Conditions-A-fib,HTN   Priority: High     Long-Range Goal: Development of POC for Mgmt of Chronic Conditions-A-fib,HTN   Start Date: 07/06/2021  Expected End Date: 07/06/2022  This Visit's Progress: On track  Priority: High  Note:   Current Barriers:  Chronic Disease Management support and education needs related to Atrial Fibrillation and HTN   RNCM Clinical Goal(s):  Patient will verbalize basic understanding of  Atrial Fibrillation and HTN disease process and self health management plan as evidenced by mgmt of chronic conditions attend all scheduled medical appointments:   as evidenced by attending PCP and INR checks appts demonstrate Ongoing adherence to prescribed treatment plan for Atrial Fibrillation and HTN as evidenced by therapeutic INR level  2-3 continue to work with RN Care Manager to address care management and care coordination needs related to  Atrial Fibrillation and HTN as evidenced by adherence to CM Team Scheduled appointments through collaboration with RN Care  manager, provider, and care team.   Interventions: POC sent to PCP upon initial assessment, quarterly and with any changes in patient's conditions Inter-disciplinary care team collaboration (see longitudinal plan of care) Evaluation of current treatment plan related to  self management and patient's adherence to plan as established by provider   AFIB Interventions: (Status:  Goal on track:  Yes.) Long Term Goal   Reviewed importance of adherence to anticoagulant exactly as prescribed Counseled  on importance of regular laboratory monitoring as prescribed  09/22/21-Patient reports she is adherent to anticoagulation therapy and routine lab work. Last INR reading was good.  Hypertension Interventions:  (Status:  Goal on track:  Yes.) Long Term Goal Last practice recorded BP readings:  BP Readings from Last 3 Encounters:  06/08/21 110/64  05/16/21 (!) 95/57  04/29/21 (!) 125/59  Most recent eGFR/CrCl: No results found for: EGFR  No components found for: CRCL  Discussed plans with patient for ongoing care management follow up and provided patient with direct contact information for care management team Provided education on prescribed diet low salt 09/29/21-Patient reports ss managed and WNL for her. She has chronic BLE edema-admits that she does not elevate extremities as often as she should.   Patient Goals/Self-Care Activities: Take all medications as prescribed Attend all scheduled provider appointments Call provider office for new concerns or questions  check pulse (heart) rate once a day keep all lab appointments learn about high blood pressure keep all doctor appointments take medications for blood pressure exactly as prescribed  Follow Up Plan:  Telephone follow up appointment with care management team member scheduled for:  quarterly-within the month of April The patient has been provided with contact information for the care management team and has been advised to call with any health related questions or concerns.        Plan: RN CM discussed with patient next outreach within the month of April. Patient agrees to care plan and follow up.   [name], RN,BSN,CCM [place] [facility] Telephonic Care Management Coordinator Direct Phone: [phone] Toll Free: [phone] Fax: [phone]

## 2021-09-23 ENCOUNTER — Other Ambulatory Visit: Payer: Self-pay

## 2021-09-23 ENCOUNTER — Ambulatory Visit (HOSPITAL_BASED_OUTPATIENT_CLINIC_OR_DEPARTMENT_OTHER): Payer: Medicare HMO | Attending: Cardiovascular Disease | Admitting: Cardiovascular Disease

## 2021-09-23 DIAGNOSIS — R400 Somnolence: Secondary | ICD-10-CM

## 2021-09-23 DIAGNOSIS — I10 Essential (primary) hypertension: Secondary | ICD-10-CM | POA: Diagnosis not present

## 2021-09-23 DIAGNOSIS — R0683 Snoring: Secondary | ICD-10-CM

## 2021-09-23 DIAGNOSIS — I480 Paroxysmal atrial fibrillation: Secondary | ICD-10-CM | POA: Insufficient documentation

## 2021-09-26 ENCOUNTER — Ambulatory Visit: Payer: Self-pay

## 2021-09-28 NOTE — Progress Notes (Incomplete)
Cardiology Office Note:    Date:  09/28/2021   ID:  [name], DOB May 18, 1941, MRN [id number]  PCP:  Isaac Bliss, Rayford Halsted, MD   [facility] Providers Cardiologist:  Cristopher Peru, MD Electrophysiologist:  Cristopher Peru, MD     Referring MD: Isaac Bliss, [place]*   No chief complaint on file.    History of Present Illness:    [name] is a 81 y.o. female with a hx of anemia, arthritis, asthma, congestive heart failure, dyspnea, emphysema of lung, GERD, hyperlipidemia, hypertension, paroxysmal atrial tachycardia s/p complete heart block, pneumonia, and paroxysmal atrial fibrillation here for follow-up. She was initially seen 05/16/2021 for the evaluation of chronic diastolic heart failure. She was seen in the ED 04/2021 for shortness of breath. BNP was within normal limits, high sensitivity troponin was negative, and D-dimer was normal. It was thought that her symptoms may be attributable to diastolic heart failure. She was seen in the hospital 01/2021 with leg swelling that was attributed to cellulitis. Nuclear stress test 05/2019 revealed LVEF 63% and no ischemia.  Today, she is feeling well overall. At her recent hospital visit, it was difficult for her to breathe. Her shortness of breath had been gradually worsening for about a week. Occasionally she feels chest pressure after moving around for a while. This has been intermittent for some time, and does not seem to be worsening. She does not check her blood pressure at home consistently. When she does check it her blood pressure is "moderate". About 2 weeks ago, she was starting to get up and became dizzy. After sitting down her symptoms resolved. She does not remember the last time she had a similar episode of dizziness. For years she has had LE edema, and may be worse depending on her activity. Every now and then she wears compression socks, but does not notice much improvement. For exercise, she knows she does not walk like  she should. However she does feel stable on her feet, and usually brings her cane. When she does walk, she tries to walk up and down a hill near her home. This does cause shortness of breath walking up the hill, but she feels better going down the hill. Lately, she endorses occasional orthopnea, and has been told that she snores. Most of the time she feels well rested in the morning, but sometimes finds herself drowsy during the day. She denies any palpitations, headaches, or syncope.   Today,  She denies any palpitations, chest pain, shortness of breath, or peripheral edema. No lightheadedness, headaches, syncope, orthopnea, or PND.  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   rxd 3 yrs meds, no surgery in kidneys    Past Surgical History:  Procedure Laterality Date   ABDOMINAL HYSTERECTOMY     BREAST BIOPSY  04/30/2012   Procedure: BREAST BIOPSY WITH NEEDLE LOCALIZATION;  Surgeon: Rolm Bookbinder, MD;  Location: [facility];  Service: General;  Laterality: Left;  Left breast wire localization biospy  HEMORRHOID SURGERY     PACEMAKER INSERTION  02-06-2011   PPM GENERATOR CHANGEOUT N/A 04/19/2020   Procedure: PPM GENERATOR CHANGEOUT;  Surgeon: Evans Lance, MD;  Location: [facility];  Service: Cardiovascular;  Laterality: N/A;   TAH and BSO     TONSILLECTOMY      Current Medications: No outpatient medications have been marked as taking for the 09/29/21 encounter (Appointment) with Skeet Latch, MD.     Allergies:   Albuterol   Social History   Socioeconomic History   Marital status: Divorced    Spouse name: Not on file   Number of  children: 2   Years of education: Not on file   Highest education level: Not on file  Occupational History   Occupation: cna  Tobacco Use   Smoking status: Former    Packs/day: 1.00    Years: 4.00    Pack years: 4.00    Types: Cigarettes    Quit date: 08/07/1977    Years since quitting: 44.1   Smokeless tobacco: Never  Vaping Use   Vaping Use: Never used  Substance and Sexual Activity   Alcohol use: No    Alcohol/week: 0.0 standard drinks    Comment: quit 1979   Drug use: No   Sexual activity: Not on file  Other Topics Concern   Not on file  Social History Narrative   Pt has 11 siblings in all   Social Determinants of Health   Financial Resource Strain: Not on file  Food Insecurity: No Food Insecurity   Worried About Charity fundraiser in the Last Year: Never true   [place] in the Last Year: Never true  Transportation Needs: Unknown   Lack of Transportation (Medical): No   Lack of Transportation (Non-Medical): Not on file  Physical Activity: Not on file  Stress: Not on file  Social Connections: Not on file     Family History: The patient's family history includes Cancer in her sister; Heart disease in her mother.  ROS:   Please see the history of present illness.     All other systems reviewed and are negative.  EKGs/Labs/Other Studies Reviewed:    The following studies were reviewed today:  LE Venous DVT 01/09/2021: Summary:  BILATERAL:  - No evidence of deep vein thrombosis seen in the lower extremities,  bilaterally.  -No evidence of popliteal cyst, bilaterally.   Echo 01/03/2020:  1. Left ventricular ejection fraction, by estimation, is 50 to 55%. The  left ventricle has low normal function. The left ventricle has no regional  wall motion abnormalities. There is mild concentric left ventricular  hypertrophy. Left ventricular  diastolic parameters are consistent with Grade I diastolic dysfunction  (impaired relaxation).   2. Right ventricular  systolic function is normal. The right ventricular  size is normal. There is mildly elevated pulmonary artery systolic  pressure.   3. Left atrial size was moderately dilated.   4. Right atrial size was mildly dilated.   5. The mitral valve is degenerative. Mild to moderate mitral valve  regurgitation.   6. Tricuspid valve regurgitation is moderate.   7. The aortic valve is tricuspid. Aortic valve regurgitation is not  visualized. Mild aortic valve sclerosis is present, with no evidence of  aortic valve stenosis.   8. There is mild (Grade II) atheroma plaque involving the aortic root and  ascending aorta.   9. The inferior vena cava is normal in size with greater than 50%  respiratory variability, suggesting right atrial pressure of 3 mmHg.   NM Myoview 06/05/2019: IMPRESSION: 1. No reversible ischemia or infarction.   2. Normal left ventricular wall motion.   3. Left ventricular ejection fraction 63%   4. Non invasive risk stratification*: Low  EKG:    09/29/2021: EKG was not ordered. 05/16/2021: EKG was not ordered.  Recent Labs: 01/10/2021: Magnesium 1.7 04/28/2021: ALT 14; B Natriuretic Peptide 28.6; BUN 17; Creatinine, Ser 1.57; Hemoglobin 11.4; Platelets 247; Potassium 5.4; Sodium 137   Recent Lipid Panel    Component Value Date/Time   CHOL 213 (H) 01/09/2021 0053   TRIG 32 01/09/2021 0053   HDL 62 01/09/2021 0053   CHOLHDL 3.4 01/09/2021 0053   VLDL 6 01/09/2021 0053   LDLCALC 145 (H) 01/09/2021 0053     Risk Assessment/Calculations:    CHA2DS2-VASc Score = 8   This indicates a 10.8% annual risk of stroke. The patient's score is based upon: CHF History: 1 HTN History: 1 Diabetes History: 0 Stroke History: 2 Vascular Disease History: 1 Age Score: 2 Gender Score: 1   {This patient has a significant risk of stroke if diagnosed with atrial fibrillation.  Please consider VKA or DOAC agent for anticoagulation if the bleeding risk is acceptable.   You can also  use the SmartPhrase .[place] for documentation.   :[id number]}  STOP-Bang Score:  5  { Consider Dx Sleep Disordered Breathing or Sleep Apnea   ICD G47.33          :1}     Physical Exam:    Wt Readings from Last 3 Encounters:  09/23/21 222 lb (100.7 kg)  06/08/21 215 lb (97.5 kg)  05/16/21 215 lb 4.8 oz (97.7 kg)     VS:  There were no vitals taken for this visit. , BMI There is no height or weight on file to calculate BMI. GENERAL:  Well appearing HEENT: Pupils equal round and reactive, fundi not visualized, oral mucosa unremarkable NECK:  No jugular venous distention, waveform within normal limits, carotid upstroke brisk and symmetric, no bruits, no thyromegaly LYMPHATICS:  No cervical adenopathy LUNGS:  Clear to auscultation bilaterally HEART:  RRR.  PMI not displaced or sustained,S1 and S2 within normal limits, no S3, no S4, no clicks, no rubs, ***II/VI systolic murmur at the LUSB ABD:  Flat, positive bowel sounds normal in frequency in pitch, no bruits, no rebound, no guarding, no midline pulsatile mass, no hepatomegaly, no splenomegaly EXT:  2 plus pulses throughout, ***trace edema with tenderness, no cyanosis no clubbing SKIN:  No rashes no nodules NEURO:  Cranial nerves II through XII grossly intact, motor grossly intact throughout PSYCH:  Cognitively intact, oriented to person place and time   ASSESSMENT:    No diagnosis found.  PLAN:    No problem-specific Assessment & Plan notes found for this encounter.     Shared Decision Making/Informed Consent The risks [chest pain, shortness of breath, cardiac arrhythmias, dizziness, blood pressure fluctuations, myocardial infarction, stroke/transient ischemic attack, nausea, vomiting, allergic reaction, radiation exposure, metallic taste sensation and life-threatening complications (estimated to be 1 in 10,000)], benefits (risk stratification, diagnosing coronary artery disease, treatment guidance) and alternatives of a  nuclear stress test were discussed in detail with Ms. Dejarnette and she agrees to proceed.    Disposition: FU with Tiffany C. Oval Linsey, MD, [facility] in ***3 months.  Medication Adjustments/Labs and Tests Ordered: Current medicines are reviewed at length with the patient today.  Concerns regarding medicines are outlined above.  No orders of the defined types were placed in this encounter.  No orders of the defined types were placed in this encounter.  There are no Patient Instructions on file for this visit.   I,Mathew Stumpf,acting as a Education administrator for Skeet Latch, MD.,have documented all relevant documentation on the behalf of Skeet Latch, MD,as directed by  Skeet Latch, MD while in the presence of Skeet Latch, MD.  I, [place] Oval Linsey, MD have reviewed all documentation for this visit.  The documentation of the exam, diagnosis, procedures, and orders on 09/28/2021 are all accurate and complete.  Waynetta Pean  09/28/2021 8:44 AM    [facility]

## 2021-09-29 ENCOUNTER — Ambulatory Visit (HOSPITAL_BASED_OUTPATIENT_CLINIC_OR_DEPARTMENT_OTHER): Payer: Medicare HMO | Admitting: Cardiovascular Disease

## 2021-10-07 ENCOUNTER — Encounter (HOSPITAL_BASED_OUTPATIENT_CLINIC_OR_DEPARTMENT_OTHER): Payer: Self-pay | Admitting: Cardiovascular Disease

## 2021-10-07 NOTE — Procedures (Signed)
° ° ° °  Patient Name: [name], [name] Date: 09/23/2021 Gender: Female D.O.B: May 06, 1941 Age (years): 65 Referring Provider: Skeet Latch Height (inches): 29 Interpreting Physician: Shelva Majestic MD, ABSM Weight (lbs): 222 RPSGT: Jorge Ny BMI: 34 MRN: [id number] Neck Size: 15.50  CLINICAL INFORMATION Sleep Study Type: NPSG  Indication for sleep study: Congestive Heart Failure, Hypertension, Obesity  Epworth Sleepiness Score: 10  SLEEP STUDY TECHNIQUE As per the AASM Manual for the Scoring of Sleep and Associated Events v2.3 (April 2016) with a hypopnea requiring 4% desaturations.  The channels recorded and monitored were frontal, central and occipital EEG, electrooculogram (EOG), submentalis EMG (chin), nasal and oral airflow, thoracic and abdominal wall motion, anterior tibialis EMG, snore microphone, electrocardiogram, and pulse oximetry.  MEDICATIONS atorvastatin (LIPITOR) 20 MG tablet levalbuterol (XOPENEX HFA) 45 MCG/ACT inhaler metoprolol tartrate (LOPRESSOR) 25 MG tablet montelukast (SINGULAIR) 10 MG tablet omeprazole (PRILOSEC) 20 MG capsule potassium chloride (KLOR-CON) 10 MEQ tablet spironolactone (ALDACTONE) 25 MG tablet SYMBICORT 80-4.5 MCG/ACT inhaler torsemide (DEMADEX) 10 MG tablet valsartan (DIOVAN) 80 MG tablet warfarin (COUMADIN) 5 MG tablet  Medications self-administered by patient taken the night of the study : N/A  SLEEP ARCHITECTURE The study was initiated at 10:54:44 PM and ended at 5:12:29 AM.  Sleep onset time was 40.2 minutes and the sleep efficiency was 66.7%%. The total sleep time was 252 minutes.  Stage REM latency was 120.5 minutes.  The patient spent 4.6%% of the night in stage N1 sleep, 69.0%% in stage N2 sleep, 0.0%% in stage N3 and 26.4% in REM.  Alpha intrusion was absent.  Supine sleep was 100.00%.  RESPIRATORY PARAMETERS The overall apnea/hypopnea index (AHI) was 1.0 per hour. The respiratory disturbance index  (RDI) was 5.7/h. There were 1 total apneas, including 1 obstructive, 0 central and 0 mixed apneas. There were 3 hypopneas and 20 RERAs.  The AHI during Stage REM sleep was 1.8 per hour.  AHI while supine was 1.0 per hour.  The mean oxygen saturation was 95.9%. The minimum SpO2 during sleep was 89.0%.  Soft snoring was noted during this study.  CARDIAC DATA The 2 lead EKG demonstrated pacemaker generated. The mean heart rate was 72.0 beats per minute. Other EKG findings include: None.  LEG MOVEMENT DATA The total PLMS were 0 with a resulting PLMS index of 0.0. Associated arousal with leg movement index was 0.0 .  IMPRESSIONS - No significant obstructive sleep apnea occurred during this study (AHI 1.0/h; RDI 5.7/h). - The patient had minimal oxygen desaturation during the study (Min O2 89.0%) - The patient snored with soft snoring volume. - Mild cardiac abnormalities were noted during this study: sinus rhyrhm with frequent PACs and transient atrial bigeminy - Clinically significant periodic limb movements did not occur during sleep. No significant associated arousals.  DIAGNOSIS - Snoring - Nocturnal Hypoxemia (G47.36)  RECOMMENDATIONS - There is no indication presently for CPAP therapy. - Effort should be made to optimize nasal and orpharyngeal patency. - Avoid alcohol, sedatives and other CNS depressants that may worsen sleep apnea and disrupt normal sleep architecture. - Sleep hygiene should be reviewed to assess factors that may improve sleep quality. - Weight management (BMI 34) and regular exercise should be initiated or continued if appropriate.  [Electronically signed] 10/07/2021 05:52 PM  Shelva Majestic MD, [facility], ABSM Diplomate, American Board of Sleep Medicine   NPI: [id number]  [place] PH: [phone]   FX: [phone] [place]

## 2021-10-17 ENCOUNTER — Telehealth: Payer: Self-pay | Admitting: *Deleted

## 2021-10-17 ENCOUNTER — Ambulatory Visit: Payer: Medicare HMO

## 2021-10-17 ENCOUNTER — Ambulatory Visit (INDEPENDENT_AMBULATORY_CARE_PROVIDER_SITE_OTHER): Payer: Medicare HMO

## 2021-10-17 DIAGNOSIS — I495 Sick sinus syndrome: Secondary | ICD-10-CM

## 2021-10-17 LAB — CUP PACEART REMOTE DEVICE CHECK
Battery Remaining Longevity: 95 mo
Battery Remaining Percentage: 87 %
Battery Voltage: 3.01 V
Brady Statistic AP VP Percent: 34 %
Brady Statistic AP VS Percent: 1 %
Brady Statistic AS VP Percent: 65 %
Brady Statistic AS VS Percent: 1 %
Brady Statistic RA Percent Paced: 32 %
Brady Statistic RV Percent Paced: 99 %
Date Time Interrogation Session: 20230313030014
Implantable Lead Implant Date: 20120702
Implantable Lead Implant Date: 20120702
Implantable Lead Location: 753859
Implantable Lead Location: 753860
Implantable Pulse Generator Implant Date: 20210913
Lead Channel Impedance Value: 360 Ohm
Lead Channel Impedance Value: 390 Ohm
Lead Channel Pacing Threshold Amplitude: 0.75 V
Lead Channel Pacing Threshold Amplitude: 1.125 V
Lead Channel Pacing Threshold Pulse Width: 0.4 ms
Lead Channel Pacing Threshold Pulse Width: 0.5 ms
Lead Channel Sensing Intrinsic Amplitude: 4 mV
Lead Channel Sensing Intrinsic Amplitude: 4.7 mV
Lead Channel Setting Pacing Amplitude: 1.375
Lead Channel Setting Pacing Amplitude: 1.75 V
Lead Channel Setting Pacing Pulse Width: 0.5 ms
Lead Channel Setting Sensing Sensitivity: 4 mV
Pulse Gen Model: 2272
Pulse Gen Serial Number: 3864080

## 2021-10-17 NOTE — Telephone Encounter (Signed)
-----   Message from Troy Sine, MD sent at 10/07/2021  5:58 PM EST ----- ?Mariann Laster, please notify of the results. ?

## 2021-10-17 NOTE — Telephone Encounter (Signed)
Patient notified of negative NSPG results for OSA. ?

## 2021-10-19 ENCOUNTER — Ambulatory Visit (INDEPENDENT_AMBULATORY_CARE_PROVIDER_SITE_OTHER): Payer: Medicare HMO

## 2021-10-19 ENCOUNTER — Telehealth: Payer: Self-pay

## 2021-10-19 DIAGNOSIS — Z7901 Long term (current) use of anticoagulants: Secondary | ICD-10-CM

## 2021-10-19 LAB — POCT INR: INR: 1.5 — AB (ref 2.0–3.0)

## 2021-10-19 MED ORDER — WARFARIN SODIUM 5 MG PO TABS: ORAL_TABLET | ORAL | 1 refills | Status: DC

## 2021-10-19 NOTE — Progress Notes (Addendum)
Pt reports she ran out of warfarin due to her mail in pharmacy this weekend. She missed two doses. Advised pt a refill will be sent to local pharmacy so she will have extra in case the pharmacy is late again. Pt appreciative.  ?Due to missing 2 doses, there will be no long term change in dosing, only two days of increased doses and then back to prior dosing.  ?Increase dose today to take 1 tablet and increase dose tomorrow to take 1 tablet and then continue 1/2 tablet daily except take 1 tablet on Mondays  Recheck in 2 weeks.  ?

## 2021-10-19 NOTE — Telephone Encounter (Signed)
Pt was able to get a ride and make it to coumadin clinic today. ? ? ?Pt's Goddaughter was not able to make it. She requested a call with the results and changes in dosing. LVM for Mardene Celeste, regarding INR, change in dosing and next recheck.  ?

## 2021-10-19 NOTE — Telephone Encounter (Signed)
Pt NS apt for coumadin clinic on 3/13.  ?Contacted pt and she reports her Goddaughter, Mardene Celeste, had covid over a week ago and she is not sure if she can bring her. She requested this nurse f/u with Mardene Celeste to see when she can bring her in for INR check.  ?Advised this nurse will f/u with her after talking to [place]. Pt appreciative for the call.  ? ?LVM  ? ? ?

## 2021-10-19 NOTE — Patient Instructions (Addendum)
Pre visit review using our clinic review tool, if applicable. No additional management support is needed unless otherwise documented below in the visit note. ? ?Increase dose today to take 1 tablet and increase dose tomorrow to take 1 tablet and then continue 1/2 tablet daily except take 1 tablet on Mondays  Recheck in 2 weeks.  ?

## 2021-10-27 NOTE — Progress Notes (Signed)
Remote pacemaker transmission.   

## 2021-11-02 ENCOUNTER — Emergency Department (HOSPITAL_BASED_OUTPATIENT_CLINIC_OR_DEPARTMENT_OTHER): Payer: Medicare HMO

## 2021-11-02 ENCOUNTER — Other Ambulatory Visit: Payer: Self-pay

## 2021-11-02 ENCOUNTER — Emergency Department (HOSPITAL_BASED_OUTPATIENT_CLINIC_OR_DEPARTMENT_OTHER): Payer: Medicare HMO | Admitting: Radiology

## 2021-11-02 ENCOUNTER — Encounter (HOSPITAL_BASED_OUTPATIENT_CLINIC_OR_DEPARTMENT_OTHER): Payer: Self-pay

## 2021-11-02 ENCOUNTER — Ambulatory Visit (INDEPENDENT_AMBULATORY_CARE_PROVIDER_SITE_OTHER): Payer: Medicare HMO

## 2021-11-02 ENCOUNTER — Emergency Department (HOSPITAL_BASED_OUTPATIENT_CLINIC_OR_DEPARTMENT_OTHER)
Admission: EM | Admit: 2021-11-02 | Discharge: 2021-11-02 | Disposition: A | Discharge status: Home / Self Care | Payer: Medicare HMO | Attending: Emergency Medicine | Admitting: Emergency Medicine

## 2021-11-02 DIAGNOSIS — Z79899 Other long term (current) drug therapy: Secondary | ICD-10-CM | POA: Insufficient documentation

## 2021-11-02 DIAGNOSIS — M7989 Other specified soft tissue disorders: Secondary | ICD-10-CM | POA: Diagnosis not present

## 2021-11-02 DIAGNOSIS — R600 Localized edema: Secondary | ICD-10-CM | POA: Diagnosis not present

## 2021-11-02 DIAGNOSIS — I4891 Unspecified atrial fibrillation: Secondary | ICD-10-CM | POA: Diagnosis not present

## 2021-11-02 DIAGNOSIS — J45909 Unspecified asthma, uncomplicated: Secondary | ICD-10-CM | POA: Diagnosis not present

## 2021-11-02 DIAGNOSIS — J449 Chronic obstructive pulmonary disease, unspecified: Secondary | ICD-10-CM | POA: Insufficient documentation

## 2021-11-02 DIAGNOSIS — I110 Hypertensive heart disease with heart failure: Secondary | ICD-10-CM | POA: Insufficient documentation

## 2021-11-02 DIAGNOSIS — I509 Heart failure, unspecified: Secondary | ICD-10-CM | POA: Insufficient documentation

## 2021-11-02 DIAGNOSIS — Z7901 Long term (current) use of anticoagulants: Secondary | ICD-10-CM | POA: Insufficient documentation

## 2021-11-02 LAB — TROPONIN I (HIGH SENSITIVITY)
Troponin I (High Sensitivity): 8 ng/L (ref <18)
Troponin I (High Sensitivity): 8 ng/L (ref <18)

## 2021-11-02 LAB — CBC WITH DIFFERENTIAL/PLATELET
Abs Immature Granulocytes: 0.02 10*3/uL (ref 0.00–0.07)
Basophils Absolute: 0.1 10*3/uL (ref 0.0–0.1)
Basophils Relative: 1 %
Eosinophils Absolute: 0.2 10*3/uL (ref 0.0–0.5)
Eosinophils Relative: 3 %
HCT: 31.3 % — ABNORMAL LOW (ref 36.0–46.0)
Hemoglobin: 9.6 g/dL — ABNORMAL LOW (ref 12.0–15.0)
Immature Granulocytes: 0 %
Lymphocytes Relative: 35 %
Lymphs Abs: 2.4 10*3/uL (ref 0.7–4.0)
MCH: 27 pg (ref 26.0–34.0)
MCHC: 30.7 g/dL (ref 30.0–36.0)
MCV: 88.2 fL (ref 80.0–100.0)
Monocytes Absolute: 0.9 10*3/uL (ref 0.1–1.0)
Monocytes Relative: 13 %
Neutro Abs: 3.3 10*3/uL (ref 1.7–7.7)
Neutrophils Relative %: 48 %
Platelets: 243 10*3/uL (ref 150–400)
RBC: 3.55 MIL/uL — ABNORMAL LOW (ref 3.87–5.11)
RDW: 16.3 % — ABNORMAL HIGH (ref 11.5–15.5)
WBC: 6.9 10*3/uL (ref 4.0–10.5)
nRBC: 0 % (ref 0.0–0.2)

## 2021-11-02 LAB — COMPREHENSIVE METABOLIC PANEL
ALT: 10 U/L (ref 0–44)
AST: 19 U/L (ref 15–41)
Albumin: 3.4 g/dL — ABNORMAL LOW (ref 3.5–5.0)
Alkaline Phosphatase: 117 U/L (ref 38–126)
Anion gap: 8 (ref 5–15)
BUN: 18 mg/dL (ref 8–23)
CO2: 24 mmol/L (ref 22–32)
Calcium: 8.7 mg/dL — ABNORMAL LOW (ref 8.9–10.3)
Chloride: 109 mmol/L (ref 98–111)
Creatinine, Ser: 1.29 mg/dL — ABNORMAL HIGH (ref 0.44–1.00)
GFR, Estimated: 41 mL/min — ABNORMAL LOW (ref >60)
Glucose, Bld: 100 mg/dL — ABNORMAL HIGH (ref 70–99)
Potassium: 3.8 mmol/L (ref 3.5–5.1)
Sodium: 141 mmol/L (ref 135–145)
Total Bilirubin: 0.7 mg/dL (ref 0.3–1.2)
Total Protein: 6.4 g/dL — ABNORMAL LOW (ref 6.5–8.1)

## 2021-11-02 LAB — BRAIN NATRIURETIC PEPTIDE: B Natriuretic Peptide: 34.9 pg/mL (ref 0.0–100.0)

## 2021-11-02 LAB — PROTIME-INR
INR: 1.8 — ABNORMAL HIGH (ref 0.8–1.2)
Prothrombin Time: 20.7 seconds — ABNORMAL HIGH (ref 11.4–15.2)

## 2021-11-02 LAB — POCT INR: INR: 1.7 — AB (ref 2.0–3.0)

## 2021-11-02 MED ORDER — FUROSEMIDE 10 MG/ML IJ SOLN
40.0000 mg | Freq: Once | INTRAMUSCULAR | Status: AC
  Administered 2021-11-02: 40 mg via INTRAVENOUS
  Filled 2021-11-02: qty 4

## 2021-11-02 NOTE — Progress Notes (Addendum)
Due to missing 3 doses, there will be no long term change in dosing, only two days of increased doses and then back to prior dosing.  ?Increase dose today to take 1 tablet and increase dose tomorrow to take 1 tablet and then continue 1/2 tablet daily except take 1 tablet on Mondays  Recheck in 2 weeks.  ? ?Pt reported R calf swelling, which she reports as edema x 2 days. Discussed with PCP and she recommends pt go to the ER now for an [place] to assess for DVT.  ?Discussed with pt and the person providing transportation. She is going to the ER at [facility] now.  ?

## 2021-11-02 NOTE — ED Triage Notes (Signed)
She c/o chronic bilat. Leg edema. She is here d/t edema in her right leg is "worse for the past couple of days". She saw her provider today, who sent her here. She tells me she is comfortable and is in no distress.  ?

## 2021-11-02 NOTE — Patient Instructions (Addendum)
Pre visit review using our clinic review tool, if applicable. No additional management support is needed unless otherwise documented below in the visit note. ? ?Increase dose today to take 1 tablet and increase dose tomorrow to take 1 tablet and then continue 1/2 tablet daily except take 1 tablet on Mondays  Recheck in 2 weeks.  ?

## 2021-11-02 NOTE — ED Provider Notes (Signed)
?[facility] EMERGENCY DEPT ?Provider Note ? ? ?CSN: [id number] ?Arrival date & time: 11/02/21  1631 ? ?  ? ?History ? ?Chief Complaint  ?Patient presents with  ? Leg Swelling  ? ? ?[name] is a 81 y.o. female. ? ?HPI ? ?  ? ?81 year old female with a history of hypertension, hyperlipidemia, CHF, paroxysmal atrial fibrillation on coumadin, asthma/COPD, lymphedema who presents with concern for leg swelling right greater than left after speaking to her physician about it.  ? ?Leg swelling so badly, pain ?Hx of CHF and has come in for fluid overload before, last time had significant swelling, was admitted, had iv diuresis ? ?Lost a lb from yesterday to today, since Nov lost 7lb, 221 now, 201 to 221 over one month, had gone up a few pounds maybe over the last week, ?Seems like both legs swelling more, right leg worse ?Left leg always smaller ?Taking torsemide, had missed a few doses last week Fri-sat-sun ? ?Has chronic dyspnea for a long time, a little worse today  ?Mild chest pain, for a few days, sharp, resting helps, not pleuritic ? ?Haven't been elevating it as much  ? ?Niece died more than one month ago, not eating well, also had recent church 55yranniversary and notes not eating a healthy diet recently ? ?Some dizziness today ?No black or bloody stools ? ?Past Medical History:  ?Diagnosis Date  ? Allergic rhinitis   ? Anemia   ? hx due to med  ? Arthritis   ? Asthma   ? [place] ([place]estive heart failure) ([place]   ? Dyspnea   ? [place] lung [place]   ? GERD (gastroesophageal reflux disease)   ? Hyperlipidemia   ? Hypertension   ? Pacemaker   ? [place]  ? Pneumonia   ? hx  ? 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 rxd 3 yrs meds, no surgery in kidneys  ?  ? ?Home Medications ?Prior to Admission medications   ?Medication Sig Start Date End Date Taking? Authorizing Provider  ?atorvastatin (LIPITOR) 20 MG tablet TAKE 1 TABLET (20 MG TOTAL) BY MOUTH DAILY AT 6 PM. 07/21/21    HIsaac Bliss ERayford Halsted MD  ?[facility]) 45 MCG/ACT inhaler Inhale 2 puffs into the lungs every 4 (four) hours as needed for wheezing. 05/31/18   MLauraine Rinne NP  ?metoprolol tartrate (LOPRESSOR) 25 MG tablet TAKE 1/2 TABLET TWICE DAILY 08/12/21   HIsaac Bliss ERayford Halsted MD  ?montelukast (SINGULAIR) 10 MG tablet TAKE 1 TABLET BY MOUTH AT BEDTIME EVERY NIGHT 07/21/21   HIsaac Bliss ERayford Halsted MD  ?omeprazole (PRILOSEC) 20 MG capsule TAKE 1 CAPSULE  BY MOUTH DAILY. TAKE 30 TO 60 MINS BEFORE FIRST MEAL OF THE DAY 06/15/21   HIsaac Bliss ERayford Halsted MD  ?potassium chloride (KLOR-CON) 10 MEQ tablet TAKE 1 TABLET EVERY DAY 06/15/21   HIsaac Bliss ERayford Halsted MD  ?spironolactone (ALDACTONE) 25 MG tablet TAKE 1/2 TABLET EVERY DAY 08/12/21   HIsaac Bliss ERayford Halsted MD  ?SYMBICORT 80-4.5 MCG/ACT inhaler INHALE 2 PUFFS INTO THE LUNGS 2 (TWO) TIMES DAILY. 06/23/21   HIsaac Bliss ERayford Halsted MD  ?torsemide (DEMADEX) 10 MG tablet TAKE 1 TABLET BY MOUTH EVERY MONDAY, WEDNESDAY, AND FRIDAY ([place] 06/23/21   HIsaac Bliss ERayford Halsted MD  ?valsartan (DIOVAN) 80 MG tablet Take 1 tablet (80 mg total) by mouth daily. ?Patient taking differently: Take 80 mg by mouth daily. Take half tab  daily 05/16/21   Skeet Latch, MD  ?warfarin (COUMADIN) 5 MG tablet TAKE 1/2 TABLET BY MOUTH DAILY EXCEPT TAKE 1 TABLET ON MONDAYS OR AS DIRECTED BY ANTICOAGULATION CLINIC 10/19/21   Isaac Bliss, Rayford Halsted, MD  ?   ? ?Allergies    ?Albuterol   ? ?Review of Systems   ?Review of Systems ?See above ?Physical Exam ?Updated Vital Signs ?BP 118/62   Pulse 60   Temp 97.8 ?F (36.6 ?C)   Resp 14   SpO2 100%  ?Physical Exam ?Vitals and nursing note reviewed.  ?Constitutional:   ?   General: She is not in acute distress. ?   Appearance: She is well-developed. She is not diaphoretic.  ?HENT:  ?   Head: Normocephalic and atraumatic.  ?Eyes:  ?   Conjunctiva/sclera: Conjunctivae normal.   ?Cardiovascular:  ?   Rate and Rhythm: Normal rate and regular rhythm.  ?   Heart sounds: Normal heart sounds. No murmur heard. ?  No friction rub. No gallop.  ?Pulmonary:  ?   Effort: Pulmonary effort is normal. No respiratory distress.  ?   Breath sounds: No wheezing.  ?Abdominal:  ?   General: There is no distension.  ?   Palpations: Abdomen is soft.  ?   Tenderness: There is no abdominal tenderness. There is no guarding.  ?Musculoskeletal:     ?   General: No tenderness.  ?   Cervical back: Normal range of motion.  ?   Right lower leg: Edema (right leg greater edema than left) present.  ?   Left lower leg: Edema present.  ?Skin: ?   General: Skin is warm and dry.  ?   Findings: No erythema or rash.  ?Neurological:  ?   Mental Status: She is alert and oriented to person, place, and time.  ? ? ?ED Results / Procedures / Treatments   ?Labs ?(all labs ordered are listed, but only abnormal results are displayed) ?Labs Reviewed  ?CBC WITH DIFFERENTIAL/PLATELET - Abnormal; Notable for the following components:  ?    Result Value  ? RBC 3.55 (*)   ? Hemoglobin 9.6 (*)   ? HCT 31.3 (*)   ? RDW 16.3 (*)   ? All other components within normal limits  ?COMPREHENSIVE METABOLIC PANEL - Abnormal; Notable for the following components:  ? Glucose, Bld 100 (*)   ? Creatinine, Ser 1.29 (*)   ? Calcium 8.7 (*)   ? Total Protein 6.4 (*)   ? Albumin 3.4 (*)   ? GFR, Estimated 41 (*)   ? All other components within normal limits  ?PROTIME-INR - Abnormal; Notable for the following components:  ? Prothrombin Time 20.7 (*)   ? INR 1.8 (*)   ? All other components within normal limits  ?BRAIN NATRIURETIC PEPTIDE  ?TROPONIN I (HIGH SENSITIVITY)  ?TROPONIN I (HIGH SENSITIVITY)  ? ? ?EKG ?EKG Interpretation ? ?Date/Time:  Wednesday November 02 2021 20:35:38 EDT ?Ventricular Rate:  71 ?PR Interval:  28 ?QRS Duration: 130 ?QT Interval:  474 ?QTC Calculation: 516 ?R Axis:   81 ?Text Interpretation: A-V dual-paced rhythm with some inhibition No  further analysis attempted due to paced rhythm No significant change since last tracing Confirmed by Gareth Morgan [id number]) on 11/02/2021 10:15:45 PM ? ?Radiology ?DG Chest 2 View ? ?Result Date: 11/02/2021 ?CLINICAL DATA:  Worsening right lower extremity edema. EXAM: CHEST - 2 VIEW COMPARISON:  April 28, 2021 FINDINGS: There is a dual lead AICD. The heart size and  mediastinal contours are within normal limits. Both lungs are clear. The visualized skeletal structures are unremarkable. IMPRESSION: No active cardiopulmonary disease. Electronically Signed   By: Virgina Norfolk M.D.   On: 11/02/2021 22:01  ? ?US Venous Img Lower Bilateral ? ?Result Date: 11/02/2021 ?CLINICAL DATA:  Initial evaluation for acute bilateral lower extremity swelling, edema, pain. EXAM: BILATERAL LOWER EXTREMITY VENOUS DOPPLER ULTRASOUND TECHNIQUE: Gray-scale sonography with graded compression, as well as color Doppler and duplex ultrasound were performed to evaluate the lower extremity deep venous systems from the level of the common femoral vein and including the common femoral, femoral, profunda femoral, popliteal and calf veins including the posterior tibial, peroneal and gastrocnemius veins when visible. The superficial great saphenous vein was also interrogated. Spectral Doppler was utilized to evaluate flow at rest and with distal augmentation maneuvers in the common femoral, femoral and popliteal veins. COMPARISON:  None. FINDINGS: RIGHT LOWER EXTREMITY Common Femoral Vein: No evidence of thrombus. Normal compressibility, respiratory phasicity and response to augmentation. Saphenofemoral Junction: No evidence of thrombus. Normal compressibility and flow on color Doppler imaging. Profunda Femoral Vein: No evidence of thrombus. Normal compressibility and flow on color Doppler imaging. Femoral Vein: No evidence of thrombus. Normal compressibility, respiratory phasicity and response to augmentation. Popliteal Vein: No evidence of  thrombus. Normal compressibility, respiratory phasicity and response to augmentation. Calf Veins: No evidence of thrombus. Normal compressibility and flow on color Doppler imaging. Superficial Great Saphenous Vein: No evidence of

## 2021-11-16 ENCOUNTER — Other Ambulatory Visit: Payer: Self-pay

## 2021-11-16 ENCOUNTER — Ambulatory Visit (INDEPENDENT_AMBULATORY_CARE_PROVIDER_SITE_OTHER): Payer: Medicare HMO

## 2021-11-16 DIAGNOSIS — Z7901 Long term (current) use of anticoagulants: Secondary | ICD-10-CM

## 2021-11-16 DIAGNOSIS — I4891 Unspecified atrial fibrillation: Secondary | ICD-10-CM

## 2021-11-16 LAB — POCT INR: INR: 1.7 — AB (ref 2.0–3.0)

## 2021-11-16 NOTE — Patient Outreach (Addendum)
[facility]) Care Management ? ?11/16/2021 ? ?Levert Feinstein ?04/25/1941 ?[id number] ? ? ?Telephone Assessment ? ? ?Successful outreach call placed to patient. She reports she is doing well. Patient recently celebrated her birthday. She continue to reside in her home alone. She has son, brother and nephew that checks on her assists her as needed.No recent falls-uses DME. Appetite good. Wgt stable. Denies any RN CM needs or concerns at this time.  ? ? ?Medications Reviewed Today   ? ? Reviewed by Hayden Pedro, RN (Registered Nurse) on 11/16/21 at [phone]  Med List Status: <None>  ? ?Medication Order Taking? Sig Documenting Provider Last Dose Status Informant  ?atorvastatin (LIPITOR) 20 MG tablet [id number]  TAKE 1 TABLET (20 MG TOTAL) BY MOUTH DAILY AT 6 PM. Isaac Bliss, Rayford Halsted, MD  Active   ?levalbuterol [facility] HFA) 45 MCG/ACT inhaler [id number] No Inhale 2 puffs into the lungs every 4 (four) hours as needed for wheezing. Lauraine Rinne, NP Taking Active Self  ?metoprolol tartrate (LOPRESSOR) 25 MG tablet [id number]  TAKE 1/2 TABLET TWICE DAILY Isaac Bliss, Rayford Halsted, MD  Active   ?montelukast (SINGULAIR) 10 MG tablet [id number]  TAKE 1 TABLET BY MOUTH AT BEDTIME EVERY NIGHT Isaac Bliss, Rayford Halsted, MD  Active   ?omeprazole (PRILOSEC) 20 MG capsule [id number]  TAKE 1 CAPSULE  BY MOUTH DAILY. TAKE 30 TO [place] OF THE DAY Isaac Bliss, Rayford Halsted, MD  Active   ?potassium chloride (KLOR-CON) 10 MEQ tablet [id number]  TAKE 1 TABLET EVERY DAY Isaac Bliss, Rayford Halsted, MD  Active   ?spironolactone (ALDACTONE) 25 MG tablet [id number]  TAKE 1/2 TABLET EVERY DAY Isaac Bliss, Rayford Halsted, MD  Active   ?SYMBICORT 80-4.5 MCG/ACT inhaler [id number]  INHALE 2 PUFFS INTO THE LUNGS 2 (TWO) TIMES DAILY. Isaac Bliss, Rayford Halsted, MD  Active   ?torsemide (DEMADEX) 10 MG tablet [id number]  TAKE 1 TABLET BY MOUTH EVERY MONDAY, WEDNESDAY, AND FRIDAY (APPOINTMENT IS NEEDED)  Isaac Bliss, Rayford Halsted, MD  Active   ?valsartan (DIOVAN) 80 MG tablet [id number] No Take 1 tablet (80 mg total) by mouth daily.  ?Patient taking differently: Take 80 mg by mouth daily. Take half tab daily  ? Skeet Latch, MD Taking Active   ?warfarin (COUMADIN) 5 MG tablet [id number]  TAKE 1/2 TABLET BY MOUTH DAILY EXCEPT TAKE 1 TABLET ON MONDAYS OR AS DIRECTED BY ANTICOAGULATION CLINIC Isaac Bliss, Rayford Halsted, MD  Active   ? ?  ?  ? ?  ?  ? ? ?  02/15/2021  ?  4:49 PM 12/17/2020  ?  9:27 AM 05/27/2018  ?  3:09 PM 05/21/2018  ? 11:19 AM  ?Depression screen PHQ 2/9  ?Decreased Interest 3 0 0 1  ?Down, Depressed, Hopeless 0 0 1 2  ?PHQ - 2 Score 3 0 1 3  ?Altered sleeping 0  0 2  ?Tired, decreased energy '3  1 3  ' ?Change in appetite 0  0 3  ?Feeling bad or failure about yourself  0  0 2  ?Trouble concentrating 0  1 1  ?Moving slowly or fidgety/restless 0  0 1  ?Suicidal thoughts 0  0 0  ?PHQ-9 Score '6  3 15  ' ?Difficult doing work/chores   Not difficult at all Not difficult at all  ?  ?SDOH Screenings  ? ?Alcohol Screen: Not on file  ?Depression (PHQ2-9): Medium Risk  ? PHQ-2 Score: 6  ?Financial Resource Strain: Not on  file  ?Food Insecurity: No Food Insecurity  ? Worried About Charity fundraiser in the Last Year: Never true  ? Ran Out of Food in the Last Year: Never true  ?Housing: Low Risk   ? Last Housing Risk Score: 0  ?Physical Activity: Not on file  ?Social Connections: Not on file  ?Stress: Not on file  ?Tobacco Use: Medium Risk  ? Smoking Tobacco Use: Former  ? Smokeless Tobacco Use: Never  ? Passive Exposure: Not on file  ?Transportation Needs: No Transportation Needs  ? Lack of Transportation (Medical): No  ? Lack of Transportation (Non-Medical): No  ? ? ? ?  04/28/2021  ? 10:59 PM 07/06/2021  ? 12:08 PM 09/22/2021  ?  9:49 AM 11/02/2021  ?  4:44 PM 11/16/2021  ?  9:55 AM  ?Fall Risk  ?Falls in the past year?  0 0  0  ?Was there an injury with Fall?  0 0  0  ?Fall Risk Category Calculator  0 0  0  ?Fall  Risk Category  Low Low  Low  ?Patient Fall Risk Level Low fall risk Moderate fall risk Moderate fall risk Moderate fall risk Moderate fall risk  ?Patient at Risk for Falls Due to  Impaired balance/gait;History of fall(s);Impaired mobility;Impaired vision Impaired balance/gait;Impaired vision;Medication side effect  Impaired balance/gait;Impaired mobility;Impaired vision;Medication side effect  ?Fall risk Follow up  Falls evaluation completed;Education provided Falls evaluation completed  Falls evaluation completed;Education provided  ?  ?Care Plan : RN Care Manager POC  ?Updates made by Hayden Pedro, RN since 11/16/2021 12:00 AM  ?  ? ?Problem: Chronic Disease Mgmt of Chronic Conditions-A-fib,HTN   ?Priority: High  ?  ? ?Long-Range Goal: Development of POC for Mgmt of Chronic Conditions-A-fib,HTN   ?Start Date: 07/06/2021  ?Expected End Date: 07/06/2022  ?This Visit's Progress: On track  ?Recent Progress: On track  ?Priority: High  ?Note:   ?Current Barriers:  ?Chronic Disease Management support and education needs related to Atrial Fibrillation and HTN  ? ?RNCM Clinical Goal(s):  ?Patient will verbalize basic understanding of  Atrial Fibrillation and HTN disease process and self health management plan as evidenced by mgmt of chronic conditions ?attend all scheduled medical appointments:   as evidenced by attending PCP and INR checks appts ?demonstrate Ongoing adherence to prescribed treatment plan for Atrial Fibrillation and HTN as evidenced by therapeutic INR level  2-3 ?continue to work with RN Care Manager to address care management and care coordination needs related to  Atrial Fibrillation and HTN as evidenced by adherence to CM Team Scheduled appointments through collaboration with RN Care manager, provider, and care team.  ? ?Interventions: ?POC sent to PCP upon initial assessment, quarterly and with any changes in patient's conditions ?Inter-disciplinary care team collaboration (see  longitudinal plan of care) ?Evaluation of current treatment plan related to  self management and patient's adherence to plan as established by provider ? ? ?AFIB Interventions: (Status:  Goal on track:  Yes.) Long Term Goal ?  Reviewed importance of adherence to anticoagulant exactly as prescribed ?Counseled on importance of regular laboratory monitoring as prescribed ? ?09/22/21-Patient reports she is adherent to anticoagulation therapy and routine lab work. Last INR reading was good. ?11/16/21-Patient reports condition stable. She goes for routine INR check this afternoon.  ? ?Hypertension Interventions:  (Status:  Goal on track:  Yes.) Long Term Goal ?Last practice recorded BP readings:  ?BP Readings from Last 3 Encounters:  ?06/08/21 110/64  ?05/16/21 (!) 95/57  ?  04/29/21 (!) 125/59  ?Most recent eGFR/CrCl: No results found for: EGFR  No components found for: CRCL ? ?Discussed plans with patient for ongoing care management follow up and provided patient with direct contact information for care management team ?Provided education on prescribed diet low salt ?09/29/21-Patient reports ss managed and WNL for her. She has chronic BLE edema-admits that she does not elevate extremities as often as she should.  ?11/16/21-Patient reports condition stable at present. No new changes. ? ?Patient Goals/Self-Care Activities: ?Take all medications as prescribed ?Attend all scheduled provider appointments ?Call provider office for new concerns or questions  ?check pulse (heart) rate once a day ?keep all lab appointments ?learn about high blood pressure ?keep all doctor appointments ?take medications for blood pressure exactly as prescribed ? ?Follow Up Plan:  Telephone follow up appointment with care management team member scheduled for:  within the month of June ?The patient has been provided with contact information for the care management team and has been advised to call with any health related questions or concerns.   ?  ?   ? ?Plan: ?RN CM discussed with patient next outreach within the month of June. Patient agrees to care plan and follow up. ?RN CM will send quarterly update to PCP.  ? ?[name], RN,BSN,CCM ?[facility] Care Management

## 2021-11-16 NOTE — Patient Instructions (Addendum)
Pre visit review using our clinic review tool, if applicable. No additional management support is needed unless otherwise documented below in the visit note. ? ?Increase dose today to take 1 tablet and then change weekly dose to take 1/2 tablet daily except take 1 tablet on Mondays and Thursdays.  Recheck in 3 weeks.  ?

## 2021-11-16 NOTE — Progress Notes (Signed)
Increase dose today to take 1 tablet and then change weekly dose to take 1/2 tablet daily except take 1 tablet on Mondays and Thursdays.  Recheck in 3 weeks.  ?

## 2021-12-07 ENCOUNTER — Ambulatory Visit (INDEPENDENT_AMBULATORY_CARE_PROVIDER_SITE_OTHER): Payer: Medicare HMO

## 2021-12-07 DIAGNOSIS — Z7901 Long term (current) use of anticoagulants: Secondary | ICD-10-CM

## 2021-12-07 LAB — POCT INR: INR: 1.4 — AB (ref 2.0–3.0)

## 2021-12-07 NOTE — Patient Instructions (Addendum)
Pre visit review using our clinic review tool, if applicable. No additional management support is needed unless otherwise documented below in the visit note. ? ?Increase dose today to take 1 tablet and increase dose tomorrow to take 1 1/2 tablets and then change weekly dose to take 1/2 tablet daily except take 1 tablet on Mondays, Thursdays and Saturday .  Recheck in 2 weeks.  ?

## 2021-12-07 NOTE — Progress Notes (Signed)
Mardene Celeste, pt's Goddaughter, reports pt did not miss any doses since last apt.   ?Increase dose today to take 1 tablet and increase dose tomorrow to take 1 1/2 tablets and then change weekly dose to take 1/2 tablet daily except take 1 tablet on Mondays, Thursdays and Saturday .  Recheck in 2 weeks.  ?

## 2021-12-15 ENCOUNTER — Other Ambulatory Visit: Payer: Self-pay | Admitting: Internal Medicine

## 2021-12-15 DIAGNOSIS — J4530 Mild persistent asthma, uncomplicated: Secondary | ICD-10-CM

## 2021-12-21 ENCOUNTER — Ambulatory Visit (INDEPENDENT_AMBULATORY_CARE_PROVIDER_SITE_OTHER): Payer: Medicare HMO

## 2021-12-21 DIAGNOSIS — Z7901 Long term (current) use of anticoagulants: Secondary | ICD-10-CM | POA: Diagnosis not present

## 2021-12-21 LAB — POCT INR: INR: 2.1 (ref 2.0–3.0)

## 2021-12-21 NOTE — Patient Instructions (Addendum)
Pre visit review using our clinic review tool, if applicable. No additional management support is needed unless otherwise documented below in the visit note. ? ?Continue 1/2 tablet daily except take 1 tablet on Mondays, Thursdays and Saturday .  Recheck in 4 weeks.  ?

## 2021-12-21 NOTE — Progress Notes (Signed)
Mardene Celeste, pt's Goddaughter, reports pt did not miss any doses since last apt.   Continue 1/2 tablet daily except take 1 tablet on Mondays, Thursdays and Saturday .  Recheck in 4 weeks.

## 2022-01-16 ENCOUNTER — Ambulatory Visit (INDEPENDENT_AMBULATORY_CARE_PROVIDER_SITE_OTHER): Payer: Medicare HMO

## 2022-01-16 ENCOUNTER — Ambulatory Visit: Payer: Medicare HMO

## 2022-01-16 DIAGNOSIS — I495 Sick sinus syndrome: Secondary | ICD-10-CM

## 2022-01-17 ENCOUNTER — Other Ambulatory Visit: Payer: Self-pay

## 2022-01-17 DIAGNOSIS — I482 Chronic atrial fibrillation, unspecified: Secondary | ICD-10-CM

## 2022-01-17 LAB — CUP PACEART REMOTE DEVICE CHECK
Battery Remaining Longevity: 91 mo
Battery Remaining Percentage: 84 %
Battery Voltage: 3.01 V
Brady Statistic AP VP Percent: 32 %
Brady Statistic AP VS Percent: 1 %
Brady Statistic AS VP Percent: 68 %
Brady Statistic AS VS Percent: 1 %
Brady Statistic RA Percent Paced: 30 %
Brady Statistic RV Percent Paced: 99 %
Date Time Interrogation Session: 20230612020020
Implantable Lead Implant Date: 20120702
Implantable Lead Implant Date: 20120702
Implantable Lead Location: 753859
Implantable Lead Location: 753860
Implantable Pulse Generator Implant Date: 20210913
Lead Channel Impedance Value: 380 Ohm
Lead Channel Impedance Value: 380 Ohm
Lead Channel Pacing Threshold Amplitude: 0.75 V
Lead Channel Pacing Threshold Amplitude: 1.375 V
Lead Channel Pacing Threshold Pulse Width: 0.4 ms
Lead Channel Pacing Threshold Pulse Width: 0.5 ms
Lead Channel Sensing Intrinsic Amplitude: 4.2 mV
Lead Channel Sensing Intrinsic Amplitude: 5.9 mV
Lead Channel Setting Pacing Amplitude: 1.625
Lead Channel Setting Pacing Amplitude: 1.75 V
Lead Channel Setting Pacing Pulse Width: 0.5 ms
Lead Channel Setting Sensing Sensitivity: 4 mV
Pulse Gen Model: 2272
Pulse Gen Serial Number: 3864080

## 2022-01-17 NOTE — Patient Outreach (Signed)
[facility]) Care Management  01/17/2022  Yaniyah Koors Oct 12, 1940 [id number]   Case Closure   Case has been transferred to [place] program and patient to be followed by assigned RN CM at PCP office.   Plan: RN CM will close case.  [name], RN,BSN,CCM [place] Management Telephonic Care Management Coordinator Direct Phone: [phone] Toll Free: [phone] Fax: [phone]

## 2022-01-18 ENCOUNTER — Telehealth: Payer: Self-pay | Admitting: *Deleted

## 2022-01-18 ENCOUNTER — Ambulatory Visit (INDEPENDENT_AMBULATORY_CARE_PROVIDER_SITE_OTHER): Payer: Medicare HMO

## 2022-01-18 DIAGNOSIS — Z7901 Long term (current) use of anticoagulants: Secondary | ICD-10-CM

## 2022-01-18 LAB — POCT INR: INR: 1.9 — AB (ref 2.0–3.0)

## 2022-01-18 NOTE — Chronic Care Management (AMB) (Signed)
  Care Management   Note  01/18/2022 Name: [name] MRN: [id number] DOB: [birth year]  [name] is a 81 y.o. year old female who is a primary care patient of Isaac Bliss, Rayford Halsted, MD. I reached out to Levert Feinstein and Irene Pap by phone today offer care coordination services.   [name] was given information about care management services today including:  Care management services include personalized support from designated clinical staff supervised by her physician, including individualized plan of care and coordination with other care providers 24/7 contact phone numbers for assistance for urgent and routine care needs. The patient may stop care management services at any time by phone call to the office staff.  Patient agreed to services and verbal consent obtained. Patient ask for Care guide to call Irene Pap caregiver and explain program and make her aware of upcoming call with RNCM and give her permisson to speak to care Team. Irene Pap is also aware of call.  Follow up plan: Telephone appointment with care management team member scheduled for:01/18/22  [place] Management  Direct Dial: [phone]

## 2022-01-18 NOTE — Patient Instructions (Addendum)
Pre visit review using our clinic review tool, if applicable. No additional management support is needed unless otherwise documented below in the visit note.  Increase dose today to take 1 tablet and then continue 1/2 tablet daily except take 1 tablet on Mondays, Thursdays and Saturday .  Recheck in 2 weeks.

## 2022-01-18 NOTE — Progress Notes (Addendum)
Increase dose today to take 1 tablet and then continue 1/2 tablet daily except take 1 tablet on Mondays, Thursdays and Saturday .  Recheck in 2 weeks.  Increase dose today to take 1 tablet and then continue 1/2 tablet daily except take 1 tablet on Mondays, Thursdays and Saturday .  Recheck in 2 weeks.

## 2022-01-19 ENCOUNTER — Telehealth: Payer: Self-pay | Admitting: *Deleted

## 2022-01-19 ENCOUNTER — Ambulatory Visit: Payer: Medicare HMO

## 2022-01-19 DIAGNOSIS — I5032 Chronic diastolic (congestive) heart failure: Secondary | ICD-10-CM

## 2022-01-19 DIAGNOSIS — I480 Paroxysmal atrial fibrillation: Secondary | ICD-10-CM

## 2022-01-19 DIAGNOSIS — I10 Essential (primary) hypertension: Secondary | ICD-10-CM

## 2022-01-19 NOTE — Chronic Care Management (AMB) (Signed)
Care Management    RN Visit Note  01/19/2022 Name: [name] MRN: [id number] DOB: July 31, 1941  Subjective: [name] is a 81 y.o. year old female who is a primary care patient of [name], Lisa Halsted, MD. The care management team was consulted for assistance with disease management and care coordination needs.    Engaged with patient by telephone for initial visit in response to provider referral for case management and/or care coordination services.   Consent to Services:   [name] was given information about Care Management services today including:  Care Management services includes personalized support from designated clinical staff supervised by her physician, including individualized plan of care and coordination with other care providers 24/7 contact phone numbers for assistance for urgent and routine care needs. The patient may stop case management services at any time by phone call to the office staff.  Patient agreed to services and consent obtained.   Assessment: Review of patient past medical history, allergies, medications, health status, including review of consultants reports, laboratory and other test data, was performed as part of comprehensive evaluation and provision of chronic care management services.   SDOH (Social Determinants of Health) assessments and interventions performed:  SDOH Interventions    Flowsheet Row Most Recent Value  SDOH Interventions   Food Insecurity Interventions Intervention Not Indicated  Financial Strain Interventions Intervention Not Indicated  Housing Interventions Intervention Not Indicated  Stress Interventions Intervention Not Indicated  Transportation Interventions Intervention Not Indicated        Care Plan  Allergies  Allergen Reactions   Albuterol Palpitations and Other (See Comments)    irregular heartbeat    Outpatient Encounter Medications as of 01/19/2022  Medication Sig   atorvastatin (LIPITOR) 20 MG  tablet TAKE 1 TABLET (20 MG TOTAL) BY MOUTH DAILY AT 6 PM.   levalbuterol (XOPENEX HFA) 45 MCG/ACT inhaler Inhale 2 puffs into the lungs every 4 (four) hours as needed for wheezing.   metoprolol tartrate (LOPRESSOR) 25 MG tablet TAKE 1/2 TABLET TWICE DAILY   montelukast (SINGULAIR) 10 MG tablet TAKE 1 TABLET EVERY NIGHT AT BEDTIME   omeprazole (PRILOSEC) 20 MG capsule TAKE 1 CAPSULE  BY MOUTH DAILY. TAKE 30 TO 60 MINS BEFORE FIRST MEAL OF THE DAY   potassium chloride (KLOR-CON) 10 MEQ tablet TAKE 1 TABLET EVERY DAY   spironolactone (ALDACTONE) 25 MG tablet TAKE 1/2 TABLET EVERY DAY   SYMBICORT 80-4.5 MCG/ACT inhaler INHALE 2 PUFFS INTO THE LUNGS 2 (TWO) TIMES DAILY.   torsemide (DEMADEX) 10 MG tablet TAKE 1 TABLET BY MOUTH EVERY MONDAY, WEDNESDAY, AND FRIDAY (APPOINTMENT IS NEEDED)   valsartan (DIOVAN) 80 MG tablet Take 1 tablet (80 mg total) by mouth daily. (Patient taking differently: Take 80 mg by mouth daily. Take half tab daily)   warfarin (COUMADIN) 5 MG tablet TAKE 1/2 TABLET BY MOUTH DAILY EXCEPT TAKE 1 TABLET ON MONDAYS OR AS DIRECTED BY ANTICOAGULATION CLINIC   No facility-administered encounter medications on file as of 01/19/2022.    Patient Active Problem List   Diagnosis Date Noted   Long term (current) use of anticoagulants 02/23/2021   Cellulitis of right leg 01/08/2021   Atrial fibrillation ([place]) 04/06/2020   Dehydration 01/29/2020   Shortness of breath 01/02/2020   Congestive heart failure with left ventricular diastolic dysfunction, acute ([place]) 06/03/2019   Acute on chronic diastolic CHF (congestive heart failure) ([place]) 06/03/2019   GERD (gastroesophageal reflux disease) 06/11/2018   Tachy-brady syndrome ([place]) 05/11/2015  Severe obesity (BMI >= 40) ([place]) 05/03/2015   Essential hypertension 02/03/2015   Dyspnea 02/02/2015   Chronic diastolic heart failure ([place]) 01/04/2015   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   Complete heart block ([place]) 05/09/2011   Obesity, Class III, BMI  40-49.9 (morbid obesity) ([place]) 05/09/2011   TB, KIDNEY, UNSPECIFIED 06/01/2007   PAROXYSMAL ATRIAL TACHYCARDIA 06/01/2007   Allergic rhinitis 06/01/2007   Mild persistent asthma 06/01/2007    Conditions to be addressed/monitored: Atrial Fibrillation, CHF, and HTN  Care Plan : RN Care Manager Plan of Care  Updates made by Lisa Ped, RN since 01/19/2022 12:00 AM  Completed 01/19/2022   Problem: Chronic Disease Mgmt of Chronic Conditions-A-fib,HTN Resolved 01/19/2022  Priority: High     Problem: Care Coordination Needs Resolved 01/19/2022  Priority: High     Goal: Care Coordination Needs Completed 01/19/2022  Start Date: 01/19/2022  Expected End Date: 01/19/2022  Note:   Current Barriers:  Care Coordination needs related to Level of care concerns, ADL IADL limitations, and caregiver stress Spoke with pt and caregiver [name].  Pt states she is needing more help taking care of herself especially bathing. Caregiver states that pt has been needing more help recently.  Denies any recent falls.  States she uses her cane when she goes out.  States she is weighting daily and weights are stable.  Denies any changes in her swelling in her legs and shortness of breath with exertion.   RNCM Clinical Goal(s):  Patient will work with Education officer, museum to address  related to the management of Level of care concerns, ADL IADL limitations, and caregiver stress related to the management of Atrial Fibrillation, CHF, and HTN as evidenced by review of EMR and patient or social worker report through collaboration with Consulting civil engineer, provider, and care team.   Interventions: 1:1 collaboration with primary care provider regarding development and update of comprehensive plan of care as evidenced by provider attestation and co-signature Inter-disciplinary care team collaboration (see longitudinal plan of care) Evaluation of current treatment plan related to  self management and patient's adherence to  plan as established by provider Referral placed to LCSW pt needs assistance with ADL/IADLs needing more assistance in the home and caregiver stress  SDOH Barriers (Status:  Goal Met.) Short Term Goal Patient interviewed and SDOH assessment performed        SDOH Interventions    Flowsheet Row Most Recent Value  SDOH Interventions   Food Insecurity Interventions Intervention Not Indicated  Financial Strain Interventions Intervention Not Indicated  Housing Interventions Intervention Not Indicated  Stress Interventions Intervention Not Indicated  Transportation Interventions Intervention Not Indicated     Patient interviewed and appropriate assessments performed Referral placed to LCSW pt needs assistance with ADL/IADLs needing more assistance in the home and caregiver stress Patient Goals/Self-Care Activities: Take all medications as prescribed Attend all scheduled provider appointments Call pharmacy for medication refills 3-7 days in advance of running out of medications  Follow Up Plan:  The patient has been provided with contact information for the care management team and has been advised to call with any health related questions or concerns.  No further follow up required: No nursing care coordination needs     Referral placed to LCSW pt needs assistance with ADL/IADLs needing more assistance in the home and caregiver stress  Plan: The patient has been provided with contact information for the care management team and has been advised to call with any health related questions or concerns.  No further  follow up required: No nursing care coordination needs  Piedra Aguza, [facility], CDE Care Management Coordinator [facility] [phone]

## 2022-01-19 NOTE — Chronic Care Management (AMB) (Unsigned)
  Care Coordination Note  01/19/2022 Name: [name] MRN: [id number] DOB: 01/02/1941  [name] is a 81 y.o. year old female who is a primary care patient of Isaac Bliss, Rayford Halsted, MD and is actively engaged with the care management team. I reached out to Levert Feinstein by phone today to assist with scheduling an initial visit with the Licensed Clinical Social Worker  Follow up plan: Unsuccessful telephone outreach attempt made. A HIPAA compliant phone message was left for the patient providing contact information and requesting a return call.  The care management team will reach out to the patient again over the next 7 days.  If patient returns call to provider office, please advise to call [place] at [phone].  [place] Management  Direct Dial: [phone]

## 2022-01-19 NOTE — Patient Instructions (Signed)
Visit Information  Thank you for allowing me to share the care management and care coordination services that are available to you as part of your health plan and services through your primary care provider and medical home. Please reach out to me at [phone] if the care management/care coordination team may be of assistance to you in the future.   Shaquavia Whisonant RN, BSN,CCM, CDE Care Management Coordinator [facility] [phone]   

## 2022-01-20 ENCOUNTER — Ambulatory Visit: Payer: Medicare HMO

## 2022-01-22 ENCOUNTER — Other Ambulatory Visit: Payer: Self-pay | Admitting: Internal Medicine

## 2022-01-22 DIAGNOSIS — J4530 Mild persistent asthma, uncomplicated: Secondary | ICD-10-CM

## 2022-01-23 NOTE — Chronic Care Management (AMB) (Signed)
  Care Coordination Note  01/23/2022 Name: [name] MRN: [id number] DOB: March 31, 1941  [name] is a 81 y.o. year old female who is a primary care patient of Isaac Bliss, Rayford Halsted, MD and is actively engaged with the care management team. I reached out to Levert Feinstein by phone today to assist with scheduling an initial visit with the Licensed Clinical Social Worker  Follow up plan: Telephone appointment with care management team member scheduled for:01/27/22  [place], [place] Management  Direct Dial: [phone]

## 2022-01-23 NOTE — Chronic Care Management (AMB) (Signed)
  Care Coordination Note  01/23/2022 Name: [name] MRN: [id number] DOB: 1941-01-20  [name] is a 81 y.o. year old female who is a primary care patient of Isaac Bliss, Rayford Halsted, MD and is actively engaged with the care management team. I reached out to Levert Feinstein by phone today to assist with scheduling an initial visit with the Licensed Clinical Social Worker  Follow up plan: Unsuccessful telephone outreach attempt made. The care management team will reach out to the patient again over the next 7-10 days. If patient returns call to provider office, please advise to call [place] at [phone].  [place] Management  Direct Dial: [phone]

## 2022-01-27 ENCOUNTER — Ambulatory Visit (INDEPENDENT_AMBULATORY_CARE_PROVIDER_SITE_OTHER): Payer: Medicare HMO | Admitting: Licensed Clinical Social Worker

## 2022-01-27 DIAGNOSIS — I10 Essential (primary) hypertension: Secondary | ICD-10-CM

## 2022-01-27 DIAGNOSIS — I480 Paroxysmal atrial fibrillation: Secondary | ICD-10-CM

## 2022-01-30 ENCOUNTER — Ambulatory Visit (INDEPENDENT_AMBULATORY_CARE_PROVIDER_SITE_OTHER): Payer: Medicare HMO

## 2022-01-30 DIAGNOSIS — Z7901 Long term (current) use of anticoagulants: Secondary | ICD-10-CM | POA: Diagnosis not present

## 2022-01-30 LAB — POCT INR: INR: 3.5 — AB (ref 2.0–3.0)

## 2022-02-01 ENCOUNTER — Ambulatory Visit: Payer: Medicare HMO

## 2022-02-02 ENCOUNTER — Telehealth: Payer: Self-pay | Admitting: *Deleted

## 2022-02-02 NOTE — Chronic Care Management (AMB) (Signed)
  Chronic Care Management Note  02/02/2022 Name: [name] MRN: [id number] DOB: 1941-05-18  [name] is a 81 y.o. year old female who is a primary care patient of Isaac Bliss, Rayford Halsted, MD and is actively engaged with the care management team. I reached out to Levert Feinstein by phone today to assist with scheduling a follow up visit with the Licensed Clinical Social Worker  Follow up plan: Unsuccessful telephone outreach attempt made. A HIPAA compliant phone message was left for the patient providing contact information and requesting a return call.  The care management team will reach out to the patient again over the next 1-2 days.  If patient returns call to provider office, please advise to call [place] at [phone].  [place] Management  Direct Dial: [phone]

## 2022-02-03 NOTE — Chronic Care Management (AMB) (Signed)
  Chronic Care Management Note  02/03/2022 Name: Bethzaida Boord MRN: [id number] DOB: 09-21-1940  Dabria Wadas is a 81 y.o. year old female who is a primary care patient of Isaac Bliss, Rayford Halsted, MD and is actively engaged with the care management team. I reached out to Levert Feinstein by phone today to assist with scheduling a follow up visit with the Licensed Clinical Social Worker  Follow up plan: Telephone appointment with care management team member scheduled for:02/09/22  [place], [place] Management  Direct Dial: [phone]

## 2022-02-03 NOTE — Chronic Care Management (AMB) (Signed)
  Chronic Care Management Note  02/03/2022 Name: [name] MRN: [id number] DOB: 04-26-1941  [name] is a 81 y.o. year old female who is a primary care patient of Isaac Bliss, Rayford Halsted, MD and is actively engaged with the care management team. I reached out to Levert Feinstein by phone today to assist with scheduling a follow up visit with the Licensed Clinical Social Worker  Follow up plan: Unsuccessful telephone outreach attempt made. A HIPAA compliant phone message was left for the patient providing contact information and requesting a return call.  The care management team will reach out to the patient again over the next 7 days.  If patient returns call to provider office, please advise to call [place]  at [phone].  [place] Management  Direct Dial: [phone]

## 2022-02-08 ENCOUNTER — Encounter: Payer: Medicare HMO | Admitting: Internal Medicine

## 2022-02-09 ENCOUNTER — Ambulatory Visit (INDEPENDENT_AMBULATORY_CARE_PROVIDER_SITE_OTHER): Payer: Medicare HMO | Admitting: Licensed Clinical Social Worker

## 2022-02-09 DIAGNOSIS — N1832 Chronic kidney disease, stage 3b: Secondary | ICD-10-CM

## 2022-02-09 DIAGNOSIS — I4891 Unspecified atrial fibrillation: Secondary | ICD-10-CM

## 2022-02-09 DIAGNOSIS — I10 Essential (primary) hypertension: Secondary | ICD-10-CM

## 2022-02-09 NOTE — Progress Notes (Signed)
Remote pacemaker transmission.   

## 2022-02-10 NOTE — Chronic Care Management (AMB) (Signed)
Chronic Care Management    Clinical Social Work Note  02/10/2022 Name: [name] MRN: [id number] DOB: 09/14/1940  [name] is a 81 y.o. year old female who is a primary care patient of Isaac Bliss, Rayford Halsted, MD. The CCM team was consulted to assist the patient with chronic disease management and/or care coordination needs related to: Level of Care Concerns.   Engaged with patient's Caregiver by telephone for follow up visit in response to provider referral for social work chronic care management and care coordination services.   Consent to Services:  The patient was given information about Chronic Care Management services, agreed to services, and gave verbal consent prior to initiation of services.  Please see initial visit note for detailed documentation.   Patient agreed to services and consent obtained.   Assessment: Review of patient past medical history, allergies, medications, and health status, including review of relevant consultants reports was performed today as part of a comprehensive evaluation and provision of chronic care management and care coordination services.     SDOH (Social Determinants of Health) assessments and interventions performed:    Advanced Directives Status: Not addressed in this encounter.  CCM Care Plan  Allergies  Allergen Reactions   Albuterol Palpitations and Other (See Comments)    irregular heartbeat    Outpatient Encounter Medications as of 02/09/2022  Medication Sig   atorvastatin (LIPITOR) 20 MG tablet TAKE 1 TABLET (20 MG TOTAL) BY MOUTH DAILY AT 6 PM.   levalbuterol (XOPENEX HFA) 45 MCG/ACT inhaler Inhale 2 puffs into the lungs every 4 (four) hours as needed for wheezing.   metoprolol tartrate (LOPRESSOR) 25 MG tablet TAKE 1/2 TABLET TWICE DAILY   montelukast (SINGULAIR) 10 MG tablet TAKE 1 TABLET EVERY NIGHT AT BEDTIME   omeprazole (PRILOSEC) 20 MG capsule TAKE 1 CAPSULE  BY MOUTH DAILY. TAKE 30 TO 60 MINS BEFORE FIRST MEAL OF  THE DAY   potassium chloride (KLOR-CON M) 10 MEQ tablet TAKE 1 TABLET EVERY DAY   spironolactone (ALDACTONE) 25 MG tablet TAKE 1/2 TABLET EVERY DAY   SYMBICORT 80-4.5 MCG/ACT inhaler INHALE 2 PUFFS INTO THE LUNGS 2 (TWO) TIMES DAILY.   torsemide (DEMADEX) 10 MG tablet TAKE 1 TABLET BY MOUTH EVERY MONDAY, WEDNESDAY, AND FRIDAY (APPOINTMENT IS NEEDED)   valsartan (DIOVAN) 80 MG tablet Take 1 tablet (80 mg total) by mouth daily. (Patient taking differently: Take 80 mg by mouth daily. Take half tab daily)   warfarin (COUMADIN) 5 MG tablet TAKE 1/2 TABLET BY MOUTH DAILY EXCEPT TAKE 1 TABLET ON MONDAYS OR AS DIRECTED BY ANTICOAGULATION CLINIC   No facility-administered encounter medications on file as of 02/09/2022.    Patient Active Problem List   Diagnosis Date Noted   Long term (current) use of anticoagulants 02/23/2021   Cellulitis of right leg 01/08/2021   Atrial fibrillation ([place]) 04/06/2020   Dehydration 01/29/2020   Shortness of breath 01/02/2020   Congestive heart failure with left ventricular diastolic dysfunction, acute ([place]) 06/03/2019   Acute on chronic diastolic CHF (congestive heart failure) ([place]) 06/03/2019   GERD (gastroesophageal reflux disease) 06/11/2018   Tachy-brady syndrome ([place]) 05/11/2015   Severe obesity (BMI >= 40) ([place]) 05/03/2015   Essential hypertension 02/03/2015   Dyspnea 02/02/2015   Chronic diastolic heart failure ([place]) 01/04/2015   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   Complete heart block ([place]) 05/09/2011   Obesity, Class III, BMI 40-49.9 (morbid obesity) ([place]) 05/09/2011   TB, KIDNEY, UNSPECIFIED 06/01/2007   PAROXYSMAL ATRIAL TACHYCARDIA  06/01/2007   Allergic rhinitis 06/01/2007   Mild persistent asthma 06/01/2007    Conditions to be addressed/monitored: Atrial Fibrillation and HTN; Level of care concerns  Care Plan : LCSW Plan of Care  Updates made by Rebekah [place], LCSW since 02/10/2022 12:00 AM     Problem: Quality of Life (General Plan of Care)       Long-Range Goal: Quality of Life Maintained   Start Date: 01/27/2022  Expected End Date: 03/06/2022  This Visit's Progress: On track  Recent Progress: On track  Priority: High  Note:   Current Barriers:  Limited social support and ADL IADL limitations   CSW Clinical Goal(s):  Patient  will verbalize understanding of plan for management of Atrial Fibrillation, CHF, and HTN  through collaboration with Clinical Social Worker, provider, and care team.   Interventions: Patient was recently awarded Medicaid 2 months ago; however, family does not have access to [employer] #. Caregiver agreed to visit DSS to obtain info and request a new card LCSW discussed CAPS and PCS program. Caregiver is interested in [facility]. LCSW will complete PCS form, once medicaid # is obtained Pt receives strong support from family. Nephew resides in the home to provide supervision Ensures med compliance 1:1 collaboration with primary care provider regarding development and update of comprehensive plan of care as evidenced by provider attestation and co-signature Inter-disciplinary care team collaboration (see longitudinal plan of care) Evaluation of current treatment plan related to  self management and patient's adherence to plan as established by provider Review resources, discussed options and provided patient information about  Enhanced Benefits connected with insurance provider:(Medicaid Programs) Solution-Focused Strategies employed:  Active listening / Reflection utilized  Emotional Support Provided Verbalization of feelings encouraged    Task & activities to accomplish goals: Attend scheduled provider appts Utilize healthy coping skills and/or supportive resources provided Contact PCP office with any questions or concerns        Follow Up Plan: SW will follow up with patient by phone over the next 4-6 weeks      Christa See, MSW, [facility] [email or web address] Phone [phone] 2:17 PM

## 2022-02-10 NOTE — Patient Instructions (Signed)
Visit Information  Thank you for taking time to visit with me today. Please don't hesitate to contact me if I can be of assistance to you before our next scheduled telephone appointment.  Following are the goals we discussed today:  Task & activities to accomplish goals: Attend scheduled provider appts Utilize healthy coping skills and/or supportive resources provided Contact PCP office with any questions or concerns  Our next appointment is by telephone on 03/02/22 at 9:15 PM  Please call the care guide team at [phone] if you need to cancel or reschedule your appointment.   If you are experiencing a Mental Health or [place] or need someone to talk to, please call 911   The patient verbalized understanding of instructions, educational materials, and care plan provided today and DECLINED offer to receive copy of patient instructions, educational materials, and care plan.   Christa See, MSW, [facility] [email or web address] Phone [phone] 2:19 PM

## 2022-02-20 ENCOUNTER — Ambulatory Visit (INDEPENDENT_AMBULATORY_CARE_PROVIDER_SITE_OTHER): Payer: Medicare HMO

## 2022-02-20 DIAGNOSIS — Z7901 Long term (current) use of anticoagulants: Secondary | ICD-10-CM

## 2022-02-20 LAB — POCT INR: INR: 2.2 (ref 2.0–3.0)

## 2022-02-20 NOTE — Progress Notes (Signed)
Continue 1/2 tablet daily except take 1 tablet on Mondays and Thursdays .  Recheck in 4 weeks.

## 2022-02-20 NOTE — Patient Instructions (Addendum)
Pre visit review using our clinic review tool, if applicable. No additional management support is needed unless otherwise documented below in the visit note.  Continue 1/2 tablet daily except take 1 tablet on Mondays and Thursdays .  Recheck in 4 weeks.

## 2022-02-21 ENCOUNTER — Telehealth: Payer: Self-pay

## 2022-02-21 NOTE — Telephone Encounter (Signed)
Pat, pt's God-daughter, sent msg she would like to RS pt's coumadin clinic apt on 8/14.  Tried to contact [place] but no answer and VM is full.

## 2022-02-21 NOTE — Telephone Encounter (Signed)
Pt has been RS for later that day.

## 2022-02-21 NOTE — Telephone Encounter (Signed)
Pt is requesting a callback to reschedule. She stated she was in the dentist office when we called her.

## 2022-02-22 ENCOUNTER — Ambulatory Visit (INDEPENDENT_AMBULATORY_CARE_PROVIDER_SITE_OTHER): Payer: Medicare HMO | Admitting: Family Medicine

## 2022-02-22 ENCOUNTER — Encounter: Payer: Self-pay | Admitting: Family Medicine

## 2022-02-22 VITALS — BP 96/48 | HR 60 | Temp 97.7°F | Ht 67.5 in | Wt 227.0 lb

## 2022-02-22 DIAGNOSIS — R600 Localized edema: Secondary | ICD-10-CM

## 2022-02-22 DIAGNOSIS — I5032 Chronic diastolic (congestive) heart failure: Secondary | ICD-10-CM | POA: Diagnosis not present

## 2022-02-22 NOTE — Progress Notes (Signed)
Established Patient Office Visit  Subjective   Patient ID: [name], female    DOB: 07-18-1941  Age: 81 y.o. MRN: [id number]  Chief Complaint  Patient presents with   Leg Swelling    Patient complains of leg swelling, x8 months    HPI   Patient seen with chronic swelling right lower extremity greater than left for apparently many years.  She has had multiple evaluations for the same including a couple of Dopplers in the past year which showed no evidence for DVT.  She is on chronic Coumadin with recent therapeutic INR (2.2 two days ago).  She is here with granddaughter today.  Granddaughter states that her right lower extremity is chronically increased over the left but perhaps slightly more so than usual.  She had a Doppler in March which showed no DVT.  No history of right lower extremity surgery.  She does have a history of chronic diastolic heart failure, hypertension, tachybradycardia syndrome, atrial fibrillation, GERD.  She is feeling low-dose Aldactone and also takes torsemide 10 mg every Monday, Wednesday, and Friday. She is occasionally use knee-high compression in the past but not consistently.  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   rxd 3 yrs meds, no surgery in kidneys   Past Surgical History:  Procedure Laterality Date   ABDOMINAL HYSTERECTOMY     BREAST BIOPSY  04/30/2012   Procedure: BREAST BIOPSY WITH NEEDLE LOCALIZATION;  Surgeon: Rolm Bookbinder, MD;  Location: [place];  Service: General;  Laterality: Left;  Left breast wire localization biospy   HEMORRHOID SURGERY     PACEMAKER INSERTION  02-06-2011   PPM GENERATOR CHANGEOUT  N/A 04/19/2020   Procedure: PPM GENERATOR CHANGEOUT;  Surgeon: Evans Lance, MD;  Location: [place] [facility];  Service: Cardiovascular;  Laterality: N/A;   TAH and BSO     TONSILLECTOMY      reports that she quit smoking about 44 years ago. Her smoking use included cigarettes. She has a 4.00 pack-year smoking history. She has never used smokeless tobacco. She reports that she does not drink alcohol and does not use drugs. family history includes Cancer in her sister; Heart disease in her mother. Allergies  Allergen Reactions   Albuterol Palpitations and Other (See Comments)    irregular heartbeat    Review of Systems  Constitutional:  Negative for chills, fever and weight loss.  Respiratory:  Negative for cough.   Cardiovascular:  Positive for leg swelling. Negative for chest pain and orthopnea.      Objective:     BP (!) 96/48 (BP Location: Left Arm, Cuff Size: Normal)   Pulse 60   Temp 97.7 F (36.5 C) (Oral)   Ht 5' 7.5" (1.715 m)   Wt 227 lb (103 kg)   SpO2 99%   BMI 35.03 kg/m  BP Readings from Last 3 Encounters:  02/22/22 (!) 96/48  11/02/21 118/62  06/08/21 110/64   Wt Readings from Last 3 Encounters:  02/22/22 227 lb (103 kg)  09/23/21 222 lb (100.7 kg)  06/08/21 215 lb (97.5 kg)      Physical Exam Constitutional:      Appearance: She is well-developed.  Eyes:     Pupils: Pupils are equal, round, and reactive to light.  Neck:     Thyroid: No thyromegaly.     Vascular: No JVD.  Cardiovascular:     Rate and Rhythm: Normal rate and regular rhythm.     Heart sounds:     No gallop.  Pulmonary:     Effort: Pulmonary effort is normal. No respiratory distress.     Breath sounds: Normal breath sounds. No wheezing or rales.  Musculoskeletal:     Cervical back: Neck supple.     Comments: She has asymmetric edema right leg much greater than left.  Only trace pitting edema left lower extremity and 1+ on the right.  No warmth.  No erythema.  No skin  breakdown.  Neurological:     Mental Status: She is alert.      No results found for any visits on 02/22/22.  Last CBC Lab Results  Component Value Date   WBC 6.9 11/02/2021   HGB 9.6 (L) 11/02/2021   HCT 31.3 (L) 11/02/2021   MCV 88.2 11/02/2021   MCH 27.0 11/02/2021   RDW 16.3 (H) 11/02/2021   PLT 243 37/11/8887   Last metabolic panel Lab Results  Component Value Date   GLUCOSE 100 (H) 11/02/2021   NA 141 11/02/2021   K 3.8 11/02/2021   CL 109 11/02/2021   CO2 24 11/02/2021   BUN 18 11/02/2021   CREATININE 1.29 (H) 11/02/2021   GFRNONAA 41 (L) 11/02/2021   CALCIUM 8.7 (L) 11/02/2021   PROT 6.4 (L) 11/02/2021   ALBUMIN 3.4 (L) 11/02/2021   BILITOT 0.7 11/02/2021   ALKPHOS 117 11/02/2021   AST 19 11/02/2021   ALT 10 11/02/2021   ANIONGAP 8 11/02/2021   Last thyroid functions Lab Results  Component Value Date   TSH 1.719 06/03/2019      The ASCVD Risk score (Arnett DK, et al., 2019) failed to calculate for the following reasons:   The 2019 ASCVD risk score is only valid for ages 61 to 23    Assessment & Plan:   #1 chronic asymmetric edema right lower extremity greater than left.  Had her primary physician glance at this and this has been chronic for some time.  Possibly slightly increased.  Likelihood of DVT extremely low with her having therapeutic INR 2 days ago.  No history of malignancy.  -We discussed frequent elevation of lower extremities -Consider increasing torsemide to 10 mg daily for 1 week and then drop back to her usual regimen of 1 every Monday, Wednesday, and Friday -Discussed trial thigh-high compression -?  Consider vascular surgery consult if not improving with above -Set up follow-up with primary soon.  No follow-ups on file.    Carolann Littler, MD

## 2022-02-22 NOTE — Patient Instructions (Addendum)
Elevate legs frequently  Increase the Torsemide 10 mg to one DAILY for the next week and then resume one every Monday, Wednesday, and Friday.

## 2022-02-28 ENCOUNTER — Telehealth: Payer: Self-pay | Admitting: Internal Medicine

## 2022-02-28 NOTE — Telephone Encounter (Signed)
Orders faxed

## 2022-02-28 NOTE — Telephone Encounter (Signed)
Pt saw dr Elease Hashimoto on 02-22-2022 and was given rx for thigh-high compression stockings and forgot the rx at home and is [facility] phone number is [phone] and fax number is [phone]

## 2022-03-02 ENCOUNTER — Telehealth: Payer: Medicare HMO

## 2022-03-03 ENCOUNTER — Telehealth: Payer: Self-pay | Admitting: Internal Medicine

## 2022-03-03 ENCOUNTER — Ambulatory Visit (INDEPENDENT_AMBULATORY_CARE_PROVIDER_SITE_OTHER): Payer: Medicare HMO | Admitting: Family Medicine

## 2022-03-03 ENCOUNTER — Encounter: Payer: Self-pay | Admitting: Family Medicine

## 2022-03-03 VITALS — BP 90/60 | HR 64 | Temp 97.6°F | Ht 67.5 in | Wt 226.9 lb

## 2022-03-03 DIAGNOSIS — R600 Localized edema: Secondary | ICD-10-CM | POA: Diagnosis not present

## 2022-03-03 DIAGNOSIS — I878 Other specified disorders of veins: Secondary | ICD-10-CM

## 2022-03-03 NOTE — Telephone Encounter (Signed)
Patient wanted to know if she could do a TOC from [name] to [name]. There is no particular reason other than availability, as it is a bit hard to get in with [name] sometimes. If she cannot do a TOC to [name], she is okay with staying with [name].      Please advise

## 2022-03-03 NOTE — Progress Notes (Signed)
Established Patient Office Visit  Subjective   Patient ID: [name], female    DOB: June 20, 1941  Age: 81 y.o. MRN: [id number]  Chief Complaint  Patient presents with   Follow-up    HPI   Patient was seen again today to reevaluate her right lower extremity swelling.  Her primary provider was out of the office today.  She is accompanied by her goddaughter who does confirm that she has had some asymmetric edema right lower extremity for years though she thinks this is much worse past few months.  She did have venous Doppler back in March of this year and also approximately a year ago with no DVT.  She is on chronic Coumadin and therapeutic.  We have seen her a couple weeks ago and had her bump her torsemide up to 10 mg daily for 1 week.  She is apparently still taking that at this time.  Blood pressure slightly low today.  She also takes Aldactone, low-dose Diovan, and metoprolol with history of diastolic heart failure.  Heart failure.  No recent dyspnea.  Very sedentary.  She had CT pelvis back in 2022 which showed no acute abnormalities.  We have discussed venous compression but apparently her leg was too large for thigh-high compression.  Goddaughter is inquiring about venous compression for the leg alone at this time.  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   rxd 3 yrs meds, no surgery in kidneys   Past Surgical History:  Procedure Laterality Date   ABDOMINAL HYSTERECTOMY     BREAST BIOPSY  04/30/2012   Procedure: BREAST BIOPSY WITH NEEDLE LOCALIZATION;  Surgeon: Rolm Bookbinder, MD;  Location: [place];  Service: General;  Laterality: Left;  Left  breast wire localization biospy   HEMORRHOID SURGERY     PACEMAKER INSERTION  02-06-2011   PPM GENERATOR CHANGEOUT N/A 04/19/2020   Procedure: PPM GENERATOR CHANGEOUT;  Surgeon: Evans Lance, MD;  Location: [place] [facility];  Service: Cardiovascular;  Laterality: N/A;   TAH and BSO     TONSILLECTOMY      reports that she quit smoking about 44 years ago. Her smoking use included cigarettes. She has a 4.00 pack-year smoking history. She has never used smokeless tobacco. She reports that she does not drink alcohol and does not use drugs. family history includes Cancer in her sister; Heart disease in her mother. Allergies  Allergen Reactions   Albuterol Palpitations and Other (See Comments)    irregular heartbeat    Review of Systems  Constitutional:  Negative for chills and fever.  Respiratory:  Negative for shortness of breath.   Cardiovascular:  Positive for leg swelling. Negative for chest pain.      Objective:     BP 90/60 (BP Location: Left Arm, Patient Position: Sitting, Cuff Size: Large)   Pulse 64   Temp 97.6 F (36.4 C) (Oral)   Ht 5' 7.5" (1.715 m)   Wt 226 lb 14.4 oz (102.9 kg)   SpO2 98%  BMI 35.01 kg/m  BP Readings from Last 3 Encounters:  03/03/22 90/60  02/22/22 (!) 96/48  11/02/21 118/62   Wt Readings from Last 3 Encounters:  03/03/22 226 lb 14.4 oz (102.9 kg)  02/22/22 227 lb (103 kg)  09/23/21 222 lb (100.7 kg)      Physical Exam Vitals reviewed.  Cardiovascular:     Rate and Rhythm: Normal rate and regular rhythm.  Pulmonary:     Effort: Pulmonary effort is normal.     Breath sounds: Normal breath sounds. No rales.  Musculoskeletal:     Comments: Significantly swollen right lower extremity compared to the left and this looks about the same as it did couple weeks ago.  No erythema.  No significant warmth.  No calf tenderness.  No skin breakdown.  No pitting edema left lower extremity  Neurological:     Mental Status: She is alert.       No results found for any visits on 03/03/22.    The ASCVD Risk score (Arnett DK, et al., 2019) failed to calculate for the following reasons:   The 2019 ASCVD risk score is only valid for ages 74 to 20    Assessment & Plan:   Asymmetric edema right lower extremity greater than left chronic and suspect largely related to venous stasis.  Recent venous Doppler no clot.  Patient on chronic Coumadin.  -They will try venous compression up to the knee -Continue frequent elevation -Reduce torsemide back to 10 mg every Monday, Wednesday, and Friday -If swelling not improving with venous compression consider vascular surgery referral.  Will defer to primary   No follow-ups on file.    Carolann Littler, MD

## 2022-03-03 NOTE — Patient Instructions (Signed)
Drop the Torsemide back to one every Monday, Wednesday, and Friday  Let's try the knee high compression right lower extremity.

## 2022-03-06 DIAGNOSIS — I4891 Unspecified atrial fibrillation: Secondary | ICD-10-CM

## 2022-03-06 DIAGNOSIS — I10 Essential (primary) hypertension: Secondary | ICD-10-CM | POA: Diagnosis not present

## 2022-03-06 NOTE — Telephone Encounter (Signed)
Patient informed of the message and verbalized understanding 

## 2022-03-20 ENCOUNTER — Ambulatory Visit: Payer: Medicare HMO

## 2022-03-21 ENCOUNTER — Telehealth: Payer: Self-pay

## 2022-03-21 NOTE — Telephone Encounter (Signed)
Received msg that Fraser Din called to RS pt's coumadin clinic apt she missed yesterday. She would like to RS for tomorrow or Monday, 8/21, later in the after. Tried to contact but no answer and VM is full. RS for tomorrow at [facility] at 3:45.

## 2022-03-22 ENCOUNTER — Other Ambulatory Visit: Payer: Self-pay | Admitting: Internal Medicine

## 2022-03-22 ENCOUNTER — Ambulatory Visit: Payer: Medicare HMO

## 2022-03-22 NOTE — Telephone Encounter (Signed)
LVM to R/S.

## 2022-03-23 NOTE — Telephone Encounter (Signed)
LVM for Pat to call back to RS coumadin clinic apt for pt.

## 2022-03-23 NOTE — Telephone Encounter (Signed)
Pat returned call. RS pt for coumadin clinic on Monday at [facility].

## 2022-03-27 ENCOUNTER — Ambulatory Visit (INDEPENDENT_AMBULATORY_CARE_PROVIDER_SITE_OTHER): Payer: Medicare HMO

## 2022-03-27 DIAGNOSIS — Z7901 Long term (current) use of anticoagulants: Secondary | ICD-10-CM

## 2022-03-27 LAB — POCT INR: INR: 2.3 (ref 2.0–3.0)

## 2022-03-27 NOTE — Progress Notes (Signed)
Continue 1/2 tablet daily except take 1 tablet on Mondays and Thursdays .  Recheck in 4 weeks.

## 2022-03-27 NOTE — Patient Instructions (Addendum)
Pre visit review using our clinic review tool, if applicable. No additional management support is needed unless otherwise documented below in the visit note.  Continue 1/2 tablet daily except take 1 tablet on Mondays and Thursdays .  Recheck in 4 weeks.

## 2022-04-07 ENCOUNTER — Other Ambulatory Visit: Payer: Self-pay | Admitting: Internal Medicine

## 2022-04-13 ENCOUNTER — Encounter: Payer: Medicare HMO | Admitting: Internal Medicine

## 2022-04-17 ENCOUNTER — Ambulatory Visit (INDEPENDENT_AMBULATORY_CARE_PROVIDER_SITE_OTHER): Payer: Medicare HMO

## 2022-04-17 DIAGNOSIS — I495 Sick sinus syndrome: Secondary | ICD-10-CM | POA: Diagnosis not present

## 2022-04-18 LAB — CUP PACEART REMOTE DEVICE CHECK
Battery Remaining Longevity: 90 mo
Battery Remaining Percentage: 81 %
Battery Voltage: 3.01 V
Brady Statistic AP VP Percent: 36 %
Brady Statistic AP VS Percent: 1 %
Brady Statistic AS VP Percent: 64 %
Brady Statistic AS VS Percent: 1 %
Brady Statistic RA Percent Paced: 34 %
Brady Statistic RV Percent Paced: 99 %
Date Time Interrogation Session: 20230911020024
Implantable Lead Implant Date: 20120702
Implantable Lead Implant Date: 20120702
Implantable Lead Location: 753859
Implantable Lead Location: 753860
Implantable Pulse Generator Implant Date: 20210913
Lead Channel Impedance Value: 360 Ohm
Lead Channel Impedance Value: 410 Ohm
Lead Channel Pacing Threshold Amplitude: 0.75 V
Lead Channel Pacing Threshold Amplitude: 1.125 V
Lead Channel Pacing Threshold Pulse Width: 0.4 ms
Lead Channel Pacing Threshold Pulse Width: 0.5 ms
Lead Channel Sensing Intrinsic Amplitude: 3.4 mV
Lead Channel Sensing Intrinsic Amplitude: 5.9 mV
Lead Channel Setting Pacing Amplitude: 1.375
Lead Channel Setting Pacing Amplitude: 1.75 V
Lead Channel Setting Pacing Pulse Width: 0.5 ms
Lead Channel Setting Sensing Sensitivity: 4 mV
Pulse Gen Model: 2272
Pulse Gen Serial Number: 3864080

## 2022-04-26 ENCOUNTER — Ambulatory Visit (INDEPENDENT_AMBULATORY_CARE_PROVIDER_SITE_OTHER): Payer: Medicare HMO

## 2022-04-26 DIAGNOSIS — Z7901 Long term (current) use of anticoagulants: Secondary | ICD-10-CM

## 2022-04-26 LAB — POCT INR: INR: 2.6 (ref 2.0–3.0)

## 2022-04-26 NOTE — Progress Notes (Signed)
Continue 1/2 tablet daily except take 1 tablet on Mondays and Thursdays. Recheck in 6 weeks.  

## 2022-04-26 NOTE — Patient Instructions (Addendum)
Pre visit review using our clinic review tool, if applicable. No additional management support is needed unless otherwise documented below in the visit note.  Continue 1/2 tablet daily except take 1 tablet on Mondays and Thursdays .  Recheck in 6 weeks.

## 2022-05-03 ENCOUNTER — Other Ambulatory Visit: Payer: Self-pay | Admitting: Internal Medicine

## 2022-05-03 ENCOUNTER — Other Ambulatory Visit: Payer: Self-pay

## 2022-05-03 DIAGNOSIS — Z7901 Long term (current) use of anticoagulants: Secondary | ICD-10-CM

## 2022-05-03 MED ORDER — WARFARIN SODIUM 5 MG PO TABS: ORAL_TABLET | ORAL | 1 refills | Status: DC

## 2022-05-04 NOTE — Progress Notes (Signed)
Remote pacemaker transmission.   

## 2022-05-12 ENCOUNTER — Other Ambulatory Visit: Payer: Self-pay | Admitting: Internal Medicine

## 2022-05-12 DIAGNOSIS — J4530 Mild persistent asthma, uncomplicated: Secondary | ICD-10-CM

## 2022-05-17 DIAGNOSIS — J4489 Other specified chronic obstructive pulmonary disease: Secondary | ICD-10-CM | POA: Diagnosis not present

## 2022-05-17 DIAGNOSIS — Z825 Family history of asthma and other chronic lower respiratory diseases: Secondary | ICD-10-CM | POA: Diagnosis not present

## 2022-05-17 DIAGNOSIS — L309 Dermatitis, unspecified: Secondary | ICD-10-CM | POA: Diagnosis not present

## 2022-05-17 DIAGNOSIS — I4891 Unspecified atrial fibrillation: Secondary | ICD-10-CM | POA: Diagnosis not present

## 2022-05-17 DIAGNOSIS — M1990 Unspecified osteoarthritis, unspecified site: Secondary | ICD-10-CM | POA: Diagnosis not present

## 2022-05-17 DIAGNOSIS — Z95810 Presence of automatic (implantable) cardiac defibrillator: Secondary | ICD-10-CM | POA: Diagnosis not present

## 2022-05-17 DIAGNOSIS — E261 Secondary hyperaldosteronism: Secondary | ICD-10-CM | POA: Diagnosis not present

## 2022-05-17 DIAGNOSIS — I130 Hypertensive heart and chronic kidney disease with heart failure and stage 1 through stage 4 chronic kidney disease, or unspecified chronic kidney disease: Secondary | ICD-10-CM | POA: Diagnosis not present

## 2022-05-17 DIAGNOSIS — I495 Sick sinus syndrome: Secondary | ICD-10-CM | POA: Diagnosis not present

## 2022-05-17 DIAGNOSIS — I739 Peripheral vascular disease, unspecified: Secondary | ICD-10-CM | POA: Diagnosis not present

## 2022-05-17 DIAGNOSIS — Z9181 History of falling: Secondary | ICD-10-CM | POA: Diagnosis not present

## 2022-05-17 DIAGNOSIS — D6869 Other thrombophilia: Secondary | ICD-10-CM | POA: Diagnosis not present

## 2022-05-17 DIAGNOSIS — I471 Supraventricular tachycardia, unspecified: Secondary | ICD-10-CM | POA: Diagnosis not present

## 2022-05-17 DIAGNOSIS — J439 Emphysema, unspecified: Secondary | ICD-10-CM | POA: Diagnosis not present

## 2022-05-17 DIAGNOSIS — I25119 Atherosclerotic heart disease of native coronary artery with unspecified angina pectoris: Secondary | ICD-10-CM | POA: Diagnosis not present

## 2022-05-17 DIAGNOSIS — N189 Chronic kidney disease, unspecified: Secondary | ICD-10-CM | POA: Diagnosis not present

## 2022-05-17 DIAGNOSIS — Z818 Family history of other mental and behavioral disorders: Secondary | ICD-10-CM | POA: Diagnosis not present

## 2022-05-17 DIAGNOSIS — I509 Heart failure, unspecified: Secondary | ICD-10-CM | POA: Diagnosis not present

## 2022-05-17 DIAGNOSIS — Z7951 Long term (current) use of inhaled steroids: Secondary | ICD-10-CM | POA: Diagnosis not present

## 2022-05-17 DIAGNOSIS — K08409 Partial loss of teeth, unspecified cause, unspecified class: Secondary | ICD-10-CM | POA: Diagnosis not present

## 2022-05-17 DIAGNOSIS — Z7901 Long term (current) use of anticoagulants: Secondary | ICD-10-CM | POA: Diagnosis not present

## 2022-05-17 DIAGNOSIS — K219 Gastro-esophageal reflux disease without esophagitis: Secondary | ICD-10-CM | POA: Diagnosis not present

## 2022-05-17 DIAGNOSIS — Z8249 Family history of ischemic heart disease and other diseases of the circulatory system: Secondary | ICD-10-CM | POA: Diagnosis not present

## 2022-05-17 DIAGNOSIS — G629 Polyneuropathy, unspecified: Secondary | ICD-10-CM | POA: Diagnosis not present

## 2022-05-17 DIAGNOSIS — Z87891 Personal history of nicotine dependence: Secondary | ICD-10-CM | POA: Diagnosis not present

## 2022-05-17 DIAGNOSIS — R2689 Other abnormalities of gait and mobility: Secondary | ICD-10-CM | POA: Diagnosis not present

## 2022-05-17 DIAGNOSIS — Z803 Family history of malignant neoplasm of breast: Secondary | ICD-10-CM | POA: Diagnosis not present

## 2022-05-25 ENCOUNTER — Ambulatory Visit (INDEPENDENT_AMBULATORY_CARE_PROVIDER_SITE_OTHER): Payer: Medicare HMO | Admitting: Internal Medicine

## 2022-05-25 ENCOUNTER — Encounter: Payer: Self-pay | Admitting: Internal Medicine

## 2022-05-25 VITALS — BP 102/64 | HR 50 | Temp 97.9°F | Ht 65.5 in | Wt 233.6 lb

## 2022-05-25 DIAGNOSIS — I5032 Chronic diastolic (congestive) heart failure: Secondary | ICD-10-CM | POA: Diagnosis not present

## 2022-05-25 DIAGNOSIS — I480 Paroxysmal atrial fibrillation: Secondary | ICD-10-CM

## 2022-05-25 DIAGNOSIS — I495 Sick sinus syndrome: Secondary | ICD-10-CM | POA: Diagnosis not present

## 2022-05-25 DIAGNOSIS — I872 Venous insufficiency (chronic) (peripheral): Secondary | ICD-10-CM | POA: Diagnosis not present

## 2022-05-25 DIAGNOSIS — Z7901 Long term (current) use of anticoagulants: Secondary | ICD-10-CM

## 2022-05-25 DIAGNOSIS — Z0000 Encounter for general adult medical examination without abnormal findings: Secondary | ICD-10-CM

## 2022-05-25 DIAGNOSIS — Z950 Presence of cardiac pacemaker: Secondary | ICD-10-CM | POA: Diagnosis not present

## 2022-05-25 DIAGNOSIS — Z23 Encounter for immunization: Secondary | ICD-10-CM

## 2022-05-25 NOTE — Addendum Note (Signed)
Addended by: Westley Hummer B on: 05/25/2022 10:18 AM   Modules accepted: Orders

## 2022-05-25 NOTE — Progress Notes (Signed)
Established Patient Office Visit     CC/Reason for Visit: Annual preventive exam and subsequent Medicare wellness visit  HPI: [name] is a 81 y.o. female who is coming in today for the above mentioned reasons. Past Medical History is significant for: Hypertension, hyperlipidemia, morbid obesity, chronic heart block and tachybradycardia syndrome status post permanent pacemaker, A-fib chronically anticoagulated on warfarin, COPD, heart failure with preserved ejection fraction.  She also has a history of chronic venous insufficiency with right greater than left lower extremity edema.  She is overdue for eye and dental care, no perceived hearing difficulty.  She is due for COVID, flu, pneumonia, Tdap vaccines.  She no longer does cancer screening.   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, no surgery in kidneys    Past Surgical History:  Procedure Laterality Date   ABDOMINAL HYSTERECTOMY     BREAST BIOPSY  04/30/2012   Procedure: BREAST BIOPSY WITH NEEDLE LOCALIZATION;  Surgeon: Rolm Bookbinder, MD;  Location: [place];  Service: General;  Laterality: Left;  Left breast wire localization biospy   HEMORRHOID SURGERY     PACEMAKER INSERTION  02-06-2011   PPM GENERATOR CHANGEOUT N/A 04/19/2020   Procedure: PPM GENERATOR CHANGEOUT;  Surgeon: Evans Lance, MD;  Location: [place] [facility];  Service: Cardiovascular;  Laterality: N/A;   TAH and BSO     TONSILLECTOMY      Social History:  reports that she quit smoking about 44 years ago. Her smoking use included cigarettes. She has a 4.00 pack-year smoking  history. She has never used smokeless tobacco. She reports that she does not drink alcohol and does not use drugs.  Allergies: Allergies  Allergen Reactions   Albuterol Palpitations and Other (See Comments)    irregular heartbeat    Family History:  Family History  Problem Relation Age of Onset   Heart disease Mother    Cancer Sister        breast     Current Outpatient Medications:    atorvastatin (LIPITOR) 20 MG tablet, Take 1 tablet (20 mg total) by mouth daily. SCHEDULE APPT FOR FUTURE REFILLS, Disp: 30 tablet, Rfl: 0   levalbuterol (XOPENEX HFA) 45 MCG/ACT inhaler, Inhale 2 puffs into the lungs every 4 (four) hours as needed for wheezing., Disp: 3 Inhaler, Rfl: 1   metoprolol tartrate (LOPRESSOR) 25 MG tablet, TAKE 1/2 TABLET TWICE DAILY, Disp: 90 tablet, Rfl: 0   montelukast (SINGULAIR) 10 MG tablet, TAKE 1 TABLET AT BEDTIME; SCHEDULE APPT FOR FUTURE REFILLS, Disp: 30 tablet, Rfl: 0   omeprazole (PRILOSEC) 20 MG capsule, TAKE 1 CAPSULE  BY MOUTH DAILY. TAKE 30 TO 60 MINS BEFORE FIRST MEAL OF THE DAY, Disp: 90 capsule, Rfl: 0   potassium chloride (KLOR-CON M) 10 MEQ tablet, TAKE 1 TABLET EVERY DAY, Disp: 90 tablet, Rfl: 0   spironolactone (ALDACTONE) 25 MG tablet, TAKE 1/2 TABLET EVERY DAY, Disp: 45 tablet, Rfl: 0   SYMBICORT 80-4.5  MCG/ACT inhaler, INHALE 2 PUFFS INTO THE LUNGS 2 (TWO) TIMES DAILY., Disp: 3 each, Rfl: 1   torsemide (DEMADEX) 10 MG tablet, TAKE 1 TABLET BY MOUTH EVERY MONDAY, WEDNESDAY, AND FRIDAY (APPOINTMENT IS NEEDED), Disp: 39 tablet, Rfl: 2   valsartan (DIOVAN) 80 MG tablet, Take 1 tablet (80 mg total) by mouth daily. (Patient taking differently: Take 80 mg by mouth daily. Take half tab daily), Disp: 90 tablet, Rfl: 3   warfarin (COUMADIN) 5 MG tablet, TAKE 1/2 TABLET DAILY OR TAKE AS DIRECTED BY ANTICOAGULATION CLINIC, Disp: 60 tablet, Rfl: 0   warfarin (COUMADIN) 5 MG tablet, TAKE 1/2 TABLET BY MOUTH DAILY EXCEPT TAKE 1 TABLET ON MONDAYS AND THURSDAYS OR AS  DIRECTED BY ANTICOAGULATION CLINIC, Disp: 60 tablet, Rfl: 1  Review of Systems:  Constitutional: Denies fever, chills, diaphoresis, appetite change and fatigue.  HEENT: Denies photophobia, eye pain, redness, hearing loss, ear pain, congestion, sore throat, rhinorrhea, sneezing, mouth sores, trouble swallowing, neck pain, neck stiffness and tinnitus.   Respiratory: Denies SOB, DOE, cough, chest tightness,  and wheezing.   Cardiovascular: Denies chest pain, palpitations. Gastrointestinal: Denies nausea, vomiting, abdominal pain, diarrhea, constipation, blood in stool and abdominal distention.  Genitourinary: Denies dysuria, urgency, frequency, hematuria, flank pain and difficulty urinating.  Endocrine: Denies: hot or cold intolerance, sweats, changes in hair or nails, polyuria, polydipsia. Musculoskeletal: Denies myalgias, back pain, joint swelling, arthralgias and gait problem.  Skin: Denies pallor, rash and wound.  Neurological: Denies dizziness, seizures, syncope, weakness, light-headedness, numbness and headaches.  Hematological: Denies adenopathy. Easy bruising, personal or family bleeding history  Psychiatric/Behavioral: Denies suicidal ideation, mood changes, confusion, nervousness, sleep disturbance and agitation    Physical Exam: Vitals:   05/25/22 0913  BP: 102/64  Pulse: (!) 50  Temp: 97.9 F (36.6 C)  TempSrc: Oral  SpO2: 98%  Weight: 233 lb 9.6 oz (106 kg)  Height: 5' 5.5" (1.664 m)    Body mass index is 38.28 kg/m.   Constitutional: NAD, calm, comfortable Eyes: PERRL, lids and conjunctivae normal, wears corrective lenses ENMT: Mucous membranes are moist. Posterior pharynx clear of any exudate or lesions. Normal dentition. Tympanic membrane is pearly white, no erythema or bulging. Neck: normal, supple, no masses, no thyromegaly Respiratory: clear to auscultation bilaterally, no wheezing, no crackles. Normal respiratory effort. No accessory muscle use.   Cardiovascular: Regular rate and rhythm, no murmurs / rubs / gallops.  3+ bilateral edema right greater than left extremity edema.  No carotid bruits.  Abdomen: no tenderness, no masses palpated. No hepatosplenomegaly. Bowel sounds positive.  Musculoskeletal: no clubbing / cyanosis. No joint deformity upper and lower extremities. Good ROM, no contractures. Normal muscle tone.  Skin: no rashes, lesions, ulcers. No induration Neurologic: CN 2-12 grossly intact. Sensation intact, DTR normal. Strength 5/5 in all 4.  Psychiatric: Normal judgment and insight. Alert and oriented x 3. Normal mood.   Subsequent Medicare wellness visit   1. Risk factors, based on past  M,S,F -cardiovascular disease risk factors include age, hypertension, hyperlipidemia, known heart failure, obesity   2.  Physical activities: Very sedentary   3.  Depression/mood: Stable, not depressed   4.  Hearing: No perceived issues   5.  ADL's: Independent in all ADLs   6.  Fall risk: Moderate fall risk due to age, obesity and use of cane, no actual falls this past year   7.  Home safety: No problems identified   8.  Height weight, and visual acuity: height and weight as above,  vision:  Vision Screening   Right eye Left eye Both eyes  Without correction     With correction '20/25 20/25 20/25 '$     9.  Counseling: Advised to increase physical activity, update all appropriate immunizations   10. Lab orders based on risk factors: Laboratory update will be reviewed   11. Referral : None today   12. Care plan: Follow up with me in 6 months, advised to follow-up with cardiology   13. Cognitive assessment: No cognitive impairment   14. Screening: Patient provided with a written and personalized 5-10 year screening schedule in the AVS.     15. Provider List Update: PCP, cardiology  16. Advance Directives: Full code   17. Opioids: Patient is not on any opioid prescriptions and has no risk factors for a substance use  disorder.   [place] Visit from 05/25/2022 in [facility] at [place]  PHQ-9 Total Score 0          07/06/2021   12:08 PM 09/22/2021    9:49 AM 11/02/2021    4:44 PM 11/16/2021    9:55 AM 01/19/2022    1:25 PM  Fall Risk  Falls in the past year? 0 0  0 0  Was there an injury with Fall? 0 0  0 0  Fall Risk Category Calculator 0 0  0 0  Fall Risk Category Low Low  Low Low  Patient Fall Risk Level Moderate fall risk Moderate fall risk Moderate fall risk Moderate fall risk Moderate fall risk  Patient at Risk for Falls Due to Impaired balance/gait;History of fall(s);Impaired mobility;Impaired vision Impaired balance/gait;Impaired vision;Medication side effect  Impaired balance/gait;Impaired mobility;Impaired vision;Medication side effect History of fall(s);Impaired mobility  Fall risk Follow up Falls evaluation completed;Education provided Falls evaluation completed  Falls evaluation completed;Education provided Education provided;Falls prevention discussed      Impression and Plan:  Encounter for preventive health examination  Need for influenza vaccination  Need for vaccination against Streptococcus pneumoniae  Morbid obesity ([place]) - Plan: Hemoglobin A1c  Paroxysmal atrial fibrillation ([place])  Long term (current) use of anticoagulants  Tachy-brady syndrome (HCC)  Pacemaker-[employer]  Chronic diastolic heart failure ([place]) - Plan: CBC with Differential/Platelet, Comprehensive metabolic panel, Lipid panel  Chronic venous insufficiency    -Recommend routine eye and dental care. -Immunizations: Flu and PCV 20 in office today, she will get Tdap and COVID at pharmacy -Healthy lifestyle discussed in detail. -Labs to be updated today. -Colon cancer screening: Not applicable due to age -Breast cancer screening: Not at applicable due to age -Cervical cancer screening: Not applicable due to age -Lung cancer screening: Not applicable -Prostate cancer  screening: Not applicable -DEXA: Declines     Lavonia Eager Isaac Bliss, MD [facility]

## 2022-05-27 ENCOUNTER — Other Ambulatory Visit: Payer: Self-pay | Admitting: Internal Medicine

## 2022-06-01 ENCOUNTER — Other Ambulatory Visit: Payer: Medicare HMO

## 2022-06-07 ENCOUNTER — Ambulatory Visit (INDEPENDENT_AMBULATORY_CARE_PROVIDER_SITE_OTHER): Payer: Medicare HMO

## 2022-06-07 DIAGNOSIS — Z7901 Long term (current) use of anticoagulants: Secondary | ICD-10-CM

## 2022-06-07 LAB — POCT INR: INR: 1.6 — AB (ref 2.0–3.0)

## 2022-06-07 NOTE — Patient Instructions (Signed)
Increase dose today to 1 tablet then continue 1/2 tablet daily except take 1 tablet on Mondays and Thursdays .  Recheck in 4 weeks, on 07/05/22 at 3:45 pm.

## 2022-06-07 NOTE — Progress Notes (Signed)
Pt and caregiver report that pt missed 2-3 doses of warfarin this week.  Increase dose today to 1 tablet then continue 1/2 tablet daily except take 1 tablet on Mondays and Thursdays .  Recheck in 4 weeks.

## 2022-06-12 ENCOUNTER — Other Ambulatory Visit: Payer: Self-pay | Admitting: Internal Medicine

## 2022-06-12 DIAGNOSIS — J4530 Mild persistent asthma, uncomplicated: Secondary | ICD-10-CM

## 2022-06-19 ENCOUNTER — Other Ambulatory Visit: Payer: Self-pay | Admitting: Internal Medicine

## 2022-06-19 ENCOUNTER — Telehealth: Payer: Self-pay | Admitting: *Deleted

## 2022-06-19 DIAGNOSIS — J4530 Mild persistent asthma, uncomplicated: Secondary | ICD-10-CM

## 2022-06-19 NOTE — Patient Outreach (Signed)
  Care Coordination   06/19/2022 Name: Kadijah Shamoon MRN: [id number] DOB: 01-13-1941   Care Coordination Outreach Attempts:  An unsuccessful telephone outreach was attempted today to offer the patient information about available care coordination services as a benefit of their health plan.   Follow Up Plan:  Additional outreach attempts will be made to offer the patient care coordination information and services.   Encounter Outcome:  No Answer  Care Coordination Interventions Activated:  No   Care Coordination Interventions:  No, not indicated    [name], RN Care Management Coordinator [place] Office [phone]

## 2022-07-05 ENCOUNTER — Telehealth: Payer: Self-pay

## 2022-07-05 ENCOUNTER — Ambulatory Visit: Payer: Medicare HMO

## 2022-07-05 NOTE — Telephone Encounter (Signed)
Pt had Coumadin Clinic appointment scheduled for today at 3:45. Called pt to reschedule. Pt asked me to call Irene Pap to arrange a date/time for new appointment.  She is pt's transportation. LVM for Mardene Celeste to call back.

## 2022-07-12 ENCOUNTER — Ambulatory Visit (INDEPENDENT_AMBULATORY_CARE_PROVIDER_SITE_OTHER): Payer: Medicare HMO

## 2022-07-12 DIAGNOSIS — Z7901 Long term (current) use of anticoagulants: Secondary | ICD-10-CM

## 2022-07-12 LAB — POCT INR: INR: 1.6 — AB (ref 2.0–3.0)

## 2022-07-12 NOTE — Progress Notes (Signed)
Pt and caregiver report that pt missed 2-3 doses of warfarin this week.   Pt missed doses before last visit also and had INR of 1.6. Pt was stable on current dose for several months, before missing any doses. No changes were made to long term dosing. Pt was given instructions for booster doses today and tomorrow. Increase dose today to 1 tablet and increase dose tomorrow to take 1 1/2 tablets and then continue 1/2 tablet daily except take 1 tablet on Mondays and Thursdays. Recheck in 4 weeks.

## 2022-07-12 NOTE — Patient Instructions (Addendum)
Pre visit review using our clinic review tool, if applicable. No additional management support is needed unless otherwise documented below in the visit note.  Increase dose today to 1 tablet and increase dose tomorrow to take 1 1/2 tablets and then continue 1/2 tablet daily except take 1 tablet on Mondays and Thursdays. Recheck in 4 weeks.

## 2022-07-17 ENCOUNTER — Ambulatory Visit (INDEPENDENT_AMBULATORY_CARE_PROVIDER_SITE_OTHER): Payer: Medicare HMO

## 2022-07-17 DIAGNOSIS — I495 Sick sinus syndrome: Secondary | ICD-10-CM | POA: Diagnosis not present

## 2022-07-17 LAB — CUP PACEART REMOTE DEVICE CHECK
Battery Remaining Longevity: 87 mo
Battery Remaining Percentage: 78 %
Battery Voltage: 3.01 V
Brady Statistic AP VP Percent: 38 %
Brady Statistic AP VS Percent: 1 %
Brady Statistic AS VP Percent: 62 %
Brady Statistic AS VS Percent: 1 %
Brady Statistic RA Percent Paced: 35 %
Brady Statistic RV Percent Paced: 99 %
Date Time Interrogation Session: 20231211020014
Implantable Lead Connection Status: 753985
Implantable Lead Connection Status: 753985
Implantable Lead Implant Date: 20120702
Implantable Lead Implant Date: 20120702
Implantable Lead Location: 753859
Implantable Lead Location: 753860
Implantable Pulse Generator Implant Date: 20210913
Lead Channel Impedance Value: 350 Ohm
Lead Channel Impedance Value: 380 Ohm
Lead Channel Pacing Threshold Amplitude: 0.875 V
Lead Channel Pacing Threshold Amplitude: 1 V
Lead Channel Pacing Threshold Pulse Width: 0.4 ms
Lead Channel Pacing Threshold Pulse Width: 0.5 ms
Lead Channel Sensing Intrinsic Amplitude: 3.9 mV
Lead Channel Sensing Intrinsic Amplitude: 5.9 mV
Lead Channel Setting Pacing Amplitude: 1.25 V
Lead Channel Setting Pacing Amplitude: 1.875
Lead Channel Setting Pacing Pulse Width: 0.5 ms
Lead Channel Setting Sensing Sensitivity: 4 mV
Pulse Gen Model: 2272
Pulse Gen Serial Number: 3864080

## 2022-07-27 ENCOUNTER — Telehealth: Payer: Self-pay | Admitting: *Deleted

## 2022-07-27 NOTE — Patient Outreach (Signed)
  Care Coordination   07/27/2022 Name: Allyana Vogan MRN: [id number] DOB: Aug 20, 1940   Care Coordination Outreach Attempts:  A second unsuccessful outreach was attempted today to offer the patient with information about available care coordination services as a benefit of their health plan.     Follow Up Plan:  Additional outreach attempts will be made to offer the patient care coordination information and services.   Encounter Outcome:  No Answer   Care Coordination Interventions:  No, not indicated    [name], RN Care Management Coordinator [place] Office [phone]

## 2022-08-08 ENCOUNTER — Telehealth: Payer: Self-pay | Admitting: Internal Medicine

## 2022-08-08 NOTE — Telephone Encounter (Signed)
Tried to contact pt's god-daughter, Mardene Celeste, to RS. LVM Only opening tomorrow is at 4:15. May need to RS to Monday 08/14/22

## 2022-08-08 NOTE — Telephone Encounter (Signed)
Pt daughter called and wanted to see if she can come to her appt tomorrow a little bit earlier than 3:30pm since she sched two appts at the same time.   You can give her a call back at [phone] and feel free to LVM.  Please advise

## 2022-08-09 ENCOUNTER — Ambulatory Visit: Payer: Medicare HMO

## 2022-08-09 NOTE — Telephone Encounter (Signed)
Unable to Rs pt for earlier apt due to schedule full. Fraser Din reports she can bring pt to [facility] coumad[facility] clinic on Friday. Pt has been RS.

## 2022-08-11 ENCOUNTER — Ambulatory Visit (INDEPENDENT_AMBULATORY_CARE_PROVIDER_SITE_OTHER): Payer: Medicare HMO

## 2022-08-11 DIAGNOSIS — Z7901 Long term (current) use of anticoagulants: Secondary | ICD-10-CM | POA: Diagnosis not present

## 2022-08-11 LAB — POCT INR: INR: 2.5 (ref 2.0–3.0)

## 2022-08-11 NOTE — Patient Instructions (Addendum)
Pre visit review using our clinic review tool, if applicable. No additional management support is needed unless otherwise documented below in the visit note.  Continue 1/2 tablet daily except take 1 tablet on Mondays and Thursdays. Recheck in 6 weeks at [facility].

## 2022-08-11 NOTE — Progress Notes (Signed)
Continue 1/2 tablet daily except take 1 tablet on Mondays and Thursdays. Recheck in 6 weeks.

## 2022-08-18 ENCOUNTER — Other Ambulatory Visit: Payer: Self-pay | Admitting: Internal Medicine

## 2022-08-18 NOTE — Progress Notes (Signed)
Remote pacemaker transmission.   

## 2022-09-19 ENCOUNTER — Ambulatory Visit (INDEPENDENT_AMBULATORY_CARE_PROVIDER_SITE_OTHER): Payer: Medicare HMO

## 2022-09-19 DIAGNOSIS — Z7901 Long term (current) use of anticoagulants: Secondary | ICD-10-CM

## 2022-09-19 LAB — POCT INR: INR: 3.7 — AB (ref 2.0–3.0)

## 2022-09-19 NOTE — Patient Instructions (Addendum)
Pre visit review using our clinic review tool, if applicable. No additional management support is needed unless otherwise documented below in the visit note.  Hold dose today and then change weekly dose to take 1/2 tablet daily except take 1 tablet on Mondays. Recheck in 3 weeks.

## 2022-09-19 NOTE — Progress Notes (Signed)
Hold dose today and then change weekly dose to take 1/2 tablet daily except take 1 tablet on Mondays. Recheck in 3 weeks.

## 2022-10-10 ENCOUNTER — Ambulatory Visit (INDEPENDENT_AMBULATORY_CARE_PROVIDER_SITE_OTHER): Payer: Medicare HMO

## 2022-10-10 DIAGNOSIS — Z7901 Long term (current) use of anticoagulants: Secondary | ICD-10-CM | POA: Diagnosis not present

## 2022-10-10 LAB — POCT INR: INR: 1.8 — AB (ref 2.0–3.0)

## 2022-10-10 NOTE — Progress Notes (Signed)
Increase dose today to take 1 tablet and then continue 1/2 tablet daily except take 1 tablet on Mondays. Recheck in 2 weeks.

## 2022-10-10 NOTE — Patient Instructions (Addendum)
Pre visit review using our clinic review tool, if applicable. No additional management support is needed unless otherwise documented below in the visit note.  Increase dose today to take 1 tablet and then continue 1/2 tablet daily except take 1 tablet on Mondays. Recheck in 2 weeks.

## 2022-10-24 ENCOUNTER — Ambulatory Visit (INDEPENDENT_AMBULATORY_CARE_PROVIDER_SITE_OTHER): Payer: Medicare HMO

## 2022-10-24 DIAGNOSIS — Z7901 Long term (current) use of anticoagulants: Secondary | ICD-10-CM | POA: Diagnosis not present

## 2022-10-24 LAB — POCT INR: INR: 2.5 (ref 2.0–3.0)

## 2022-10-24 NOTE — Progress Notes (Signed)
Continue 1/2 tablet daily except take 1 tablet on Mondays.  Re-check in 6 weeks.  °

## 2022-10-24 NOTE — Patient Instructions (Addendum)
Pre visit review using our clinic review tool, if applicable. No additional management support is needed unless otherwise documented below in the visit note.  Continue 1/2 tablet daily except take 1 tablet on Mondays. Recheck in 6 weeks.    

## 2022-11-12 ENCOUNTER — Other Ambulatory Visit: Payer: Self-pay | Admitting: Internal Medicine

## 2022-11-12 DIAGNOSIS — J4530 Mild persistent asthma, uncomplicated: Secondary | ICD-10-CM

## 2022-11-13 ENCOUNTER — Ambulatory Visit: Payer: Medicare HMO | Admitting: Family Medicine

## 2022-12-05 ENCOUNTER — Ambulatory Visit (INDEPENDENT_AMBULATORY_CARE_PROVIDER_SITE_OTHER): Payer: Medicare HMO

## 2022-12-05 DIAGNOSIS — Z7901 Long term (current) use of anticoagulants: Secondary | ICD-10-CM

## 2022-12-05 LAB — POCT INR: INR: 1.3 — AB (ref 2.0–3.0)

## 2022-12-05 NOTE — Patient Instructions (Addendum)
Pre visit review using our clinic review tool, if applicable. No additional management support is needed unless otherwise documented below in the visit note.  Increase dose today to take 1 tablet and increase dose tomorrow to take 1 tablet and the  continue 1/2 tablet daily except take 1 tablet on Mondays. Recheck in 2 weeks.

## 2022-12-05 NOTE — Progress Notes (Signed)
Increase dose today to take 1 tablet and increase dose tomorrow to take 1 tablet and the  continue 1/2 tablet daily except take 1 tablet on Mondays. Recheck in 2 weeks.

## 2022-12-07 ENCOUNTER — Emergency Department (HOSPITAL_COMMUNITY): Payer: Medicare HMO

## 2022-12-07 ENCOUNTER — Observation Stay (HOSPITAL_COMMUNITY)
Admission: EM | Admit: 2022-12-07 | Discharge: 2022-12-09 | Disposition: A | Discharge status: Home / Self Care | Payer: Medicare HMO | Attending: Student | Admitting: Student

## 2022-12-07 ENCOUNTER — Ambulatory Visit (INDEPENDENT_AMBULATORY_CARE_PROVIDER_SITE_OTHER): Payer: Medicare HMO | Admitting: Internal Medicine

## 2022-12-07 ENCOUNTER — Other Ambulatory Visit: Payer: Self-pay

## 2022-12-07 ENCOUNTER — Encounter (HOSPITAL_COMMUNITY): Payer: Self-pay

## 2022-12-07 ENCOUNTER — Encounter: Payer: Self-pay | Admitting: Internal Medicine

## 2022-12-07 VITALS — BP 80/50 | HR 64 | Temp 97.6°F | Wt 215.1 lb

## 2022-12-07 DIAGNOSIS — Z7901 Long term (current) use of anticoagulants: Secondary | ICD-10-CM | POA: Diagnosis not present

## 2022-12-07 DIAGNOSIS — I442 Atrioventricular block, complete: Secondary | ICD-10-CM | POA: Diagnosis not present

## 2022-12-07 DIAGNOSIS — N1831 Chronic kidney disease, stage 3a: Secondary | ICD-10-CM | POA: Diagnosis not present

## 2022-12-07 DIAGNOSIS — I2693 Single subsegmental pulmonary embolism without acute cor pulmonale: Secondary | ICD-10-CM | POA: Diagnosis not present

## 2022-12-07 DIAGNOSIS — Z79899 Other long term (current) drug therapy: Secondary | ICD-10-CM | POA: Diagnosis not present

## 2022-12-07 DIAGNOSIS — I130 Hypertensive heart and chronic kidney disease with heart failure and stage 1 through stage 4 chronic kidney disease, or unspecified chronic kidney disease: Secondary | ICD-10-CM | POA: Insufficient documentation

## 2022-12-07 DIAGNOSIS — J4530 Mild persistent asthma, uncomplicated: Secondary | ICD-10-CM | POA: Diagnosis present

## 2022-12-07 DIAGNOSIS — I110 Hypertensive heart disease with heart failure: Secondary | ICD-10-CM | POA: Diagnosis not present

## 2022-12-07 DIAGNOSIS — I959 Hypotension, unspecified: Secondary | ICD-10-CM

## 2022-12-07 DIAGNOSIS — I10 Essential (primary) hypertension: Secondary | ICD-10-CM | POA: Diagnosis present

## 2022-12-07 DIAGNOSIS — Z87891 Personal history of nicotine dependence: Secondary | ICD-10-CM | POA: Diagnosis not present

## 2022-12-07 DIAGNOSIS — R3589 Other polyuria: Secondary | ICD-10-CM | POA: Diagnosis not present

## 2022-12-07 DIAGNOSIS — R0600 Dyspnea, unspecified: Secondary | ICD-10-CM

## 2022-12-07 DIAGNOSIS — J449 Chronic obstructive pulmonary disease, unspecified: Secondary | ICD-10-CM | POA: Diagnosis not present

## 2022-12-07 DIAGNOSIS — R0602 Shortness of breath: Secondary | ICD-10-CM | POA: Diagnosis not present

## 2022-12-07 DIAGNOSIS — Z950 Presence of cardiac pacemaker: Secondary | ICD-10-CM | POA: Diagnosis present

## 2022-12-07 DIAGNOSIS — I4891 Unspecified atrial fibrillation: Secondary | ICD-10-CM | POA: Insufficient documentation

## 2022-12-07 DIAGNOSIS — D509 Iron deficiency anemia, unspecified: Secondary | ICD-10-CM | POA: Insufficient documentation

## 2022-12-07 DIAGNOSIS — I2699 Other pulmonary embolism without acute cor pulmonale: Secondary | ICD-10-CM | POA: Diagnosis present

## 2022-12-07 DIAGNOSIS — K219 Gastro-esophageal reflux disease without esophagitis: Secondary | ICD-10-CM | POA: Diagnosis present

## 2022-12-07 DIAGNOSIS — I5032 Chronic diastolic (congestive) heart failure: Secondary | ICD-10-CM | POA: Diagnosis present

## 2022-12-07 DIAGNOSIS — Z1152 Encounter for screening for COVID-19: Secondary | ICD-10-CM | POA: Insufficient documentation

## 2022-12-07 DIAGNOSIS — I2694 Multiple subsegmental pulmonary emboli without acute cor pulmonale: Secondary | ICD-10-CM

## 2022-12-07 LAB — BASIC METABOLIC PANEL
Anion gap: 8 (ref 5–15)
BUN: 23 mg/dL (ref 8–23)
CO2: 22 mmol/L (ref 22–32)
Calcium: 8.5 mg/dL — ABNORMAL LOW (ref 8.9–10.3)
Chloride: 107 mmol/L (ref 98–111)
Creatinine, Ser: 1.39 mg/dL — ABNORMAL HIGH (ref 0.44–1.00)
GFR, Estimated: 37 mL/min — ABNORMAL LOW (ref >60)
Glucose, Bld: 98 mg/dL (ref 70–99)
Potassium: 4.7 mmol/L (ref 3.5–5.1)
Sodium: 137 mmol/L (ref 135–145)

## 2022-12-07 LAB — CBC WITH DIFFERENTIAL/PLATELET
Abs Immature Granulocytes: 0.03 10*3/uL (ref 0.00–0.07)
Basophils Absolute: 0.1 10*3/uL (ref 0.0–0.1)
Basophils Relative: 1 %
Eosinophils Absolute: 0.3 10*3/uL (ref 0.0–0.5)
Eosinophils Relative: 4 %
HCT: 31.5 % — ABNORMAL LOW (ref 36.0–46.0)
Hemoglobin: 9 g/dL — ABNORMAL LOW (ref 12.0–15.0)
Immature Granulocytes: 0 %
Lymphocytes Relative: 28 %
Lymphs Abs: 2 10*3/uL (ref 0.7–4.0)
MCH: 22.3 pg — ABNORMAL LOW (ref 26.0–34.0)
MCHC: 28.6 g/dL — ABNORMAL LOW (ref 30.0–36.0)
MCV: 78.2 fL — ABNORMAL LOW (ref 80.0–100.0)
Monocytes Absolute: 0.7 10*3/uL (ref 0.1–1.0)
Monocytes Relative: 10 %
Neutro Abs: 4.1 10*3/uL (ref 1.7–7.7)
Neutrophils Relative %: 57 %
Platelets: 251 10*3/uL (ref 150–400)
RBC: 4.03 MIL/uL (ref 3.87–5.11)
RDW: 21.3 % — ABNORMAL HIGH (ref 11.5–15.5)
WBC: 7.2 10*3/uL (ref 4.0–10.5)
nRBC: 0 % (ref 0.0–0.2)

## 2022-12-07 LAB — PROTIME-INR
INR: 1.3 — ABNORMAL HIGH (ref 0.8–1.2)
Prothrombin Time: 15.7 seconds — ABNORMAL HIGH (ref 11.4–15.2)

## 2022-12-07 LAB — BRAIN NATRIURETIC PEPTIDE: B Natriuretic Peptide: 78.7 pg/mL (ref 0.0–100.0)

## 2022-12-07 LAB — APTT: aPTT: 32 seconds (ref 24–36)

## 2022-12-07 LAB — SARS CORONAVIRUS 2 BY RT PCR: SARS Coronavirus 2 by RT PCR: NEGATIVE

## 2022-12-07 LAB — TROPONIN I (HIGH SENSITIVITY)
Troponin I (High Sensitivity): 6 ng/L (ref <18)
Troponin I (High Sensitivity): 6 ng/L (ref <18)

## 2022-12-07 MED ORDER — SODIUM CHLORIDE 0.9 % IV BOLUS
500.0000 mL | Freq: Once | INTRAVENOUS | Status: AC
  Administered 2022-12-07: 500 mL via INTRAVENOUS

## 2022-12-07 MED ORDER — IOHEXOL 350 MG/ML SOLN
60.0000 mL | Freq: Once | INTRAVENOUS | Status: AC | PRN
  Administered 2022-12-07: 60 mL via INTRAVENOUS

## 2022-12-07 MED ORDER — METOPROLOL TARTRATE 25 MG PO TABS
12.5000 mg | ORAL_TABLET | Freq: Once | ORAL | Status: AC
  Administered 2022-12-07: 12.5 mg via ORAL
  Filled 2022-12-07: qty 1

## 2022-12-07 MED ORDER — HEPARIN (PORCINE) 25000 UT/250ML-% IV SOLN
1250.0000 [IU]/h | INTRAVENOUS | Status: DC
  Administered 2022-12-07: 1250 [IU]/h via INTRAVENOUS
  Filled 2022-12-07: qty 250

## 2022-12-07 MED ORDER — WARFARIN - PHARMACIST DOSING INPATIENT: Freq: Every day | Status: DC

## 2022-12-07 MED ORDER — HEPARIN BOLUS VIA INFUSION
2500.0000 [IU] | Freq: Once | INTRAVENOUS | Status: AC
  Administered 2022-12-07: 2500 [IU] via INTRAVENOUS
  Filled 2022-12-07: qty 2500

## 2022-12-07 MED ORDER — WARFARIN SODIUM 5 MG PO TABS
5.0000 mg | ORAL_TABLET | Freq: Once | ORAL | Status: DC
  Filled 2022-12-07: qty 1

## 2022-12-07 NOTE — ED Triage Notes (Signed)
C/o dyspnea on exertion with lightheadedness x2 weeks.  Pt reports no meds in 2 weeks including blood pressure medicine and coumadin.  Pt talking in full sentences, no distress noted Hx a-fib on coumadin, chf Denies cough/ chest pain

## 2022-12-07 NOTE — ED Provider Notes (Signed)
Seen after prior ED provider.  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  Patient is noncompliant with Coumadin and is subtherapeutic.  Heparin drip initiated here in the ED.  Hospitalist service made aware of case and will evaluate for admission.   Wynetta Fines, MD 12/07/22 [phone]

## 2022-12-07 NOTE — Progress Notes (Signed)
Established Patient Office Visit     CC/Reason for Visit: Dyspnea on exertion and lightheadedness  HPI: [name] is a 82 y.o. female who is coming in today for the above mentioned reasons. Past Medical History is significant for: Atrial fibrillation on Coumadin, GERD, congestive heart failure, hyperlipidemia.  She tells me for the past 1 to 2 weeks she has been feeling very fatigued and lightheaded, dyspneic with even minimal exertion, has not had an appetite and has had some weight loss.  She has not taken any of her medications in at least 2 weeks these include her warfarin and her blood pressure medication.  She has not had chest pain, no palpitations.  No increase in lower extremity edema.   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, no surgery in kidneys    Past Surgical History:  Procedure Laterality Date   ABDOMINAL HYSTERECTOMY     BREAST BIOPSY  04/30/2012   Procedure: BREAST BIOPSY WITH NEEDLE LOCALIZATION;  Surgeon: Emelia Loron, MD;  Location: [facility] OR;  Service: General;  Laterality: Left;  Left breast wire localization biospy   HEMORRHOID SURGERY     PACEMAKER INSERTION  02-06-2011   PPM GENERATOR CHANGEOUT N/A 04/19/2020   Procedure: PPM GENERATOR CHANGEOUT;  Surgeon: Marinus Maw, MD;  Location: [facility] INVASIVE CV LAB;  Service: Cardiovascular;  Laterality: N/A;   TAH and BSO     TONSILLECTOMY      Social History:  reports that she quit smoking about 45 years ago. Her smoking use included cigarettes. She has a 4.00 pack-year smoking history. She has never used smokeless tobacco. She  reports that she does not drink alcohol and does not use drugs.  Allergies: Allergies  Allergen Reactions   Albuterol Palpitations and Other (See Comments)    irregular heartbeat    Family History:  Family History  Problem Relation Age of Onset   Heart disease Mother    Cancer Sister        breast     Current Outpatient Medications:    atorvastatin (LIPITOR) 20 MG tablet, Take 1 tablet (20 mg total) by mouth daily. SCHEDULE APPT FOR FUTURE REFILLS, Disp: 30 tablet, Rfl: 0   budesonide-formoterol (SYMBICORT) 80-4.5 MCG/ACT inhaler, INHALE 2 PUFFS INTO THE LUNGS 2 (TWO) TIMES DAILY., Disp: 3 each, Rfl: 0   metoprolol tartrate (LOPRESSOR) 25 MG tablet, TAKE 1/2 TABLET TWICE DAILY, Disp: 90 tablet, Rfl: 1   montelukast (SINGULAIR) 10 MG tablet, TAKE 1 TABLET AT BEDTIME; SCHEDULE APPT FOR FUTURE REFILLS, Disp: 30 tablet, Rfl: 0   omeprazole (PRILOSEC) 20 MG capsule, TAKE 1 CAPSULE EVERY DAY 30 TO 60 MINUTES BEFORE FIRST MEAL OF THE DAY, Disp: 90 capsule, Rfl: 0   potassium chloride (KLOR-CON M) 10 MEQ tablet, TAKE 1 TABLET EVERY DAY, Disp: 90 tablet, Rfl: 10   spironolactone (ALDACTONE) 25 MG tablet, TAKE 1/2 TABLET EVERY DAY, Disp: 45 tablet, Rfl: 1   torsemide (DEMADEX) 10 MG  tablet, TAKE 1 TABLET BY MOUTH EVERY MONDAY, WEDNESDAY, AND FRIDAY (APPOINTMENT IS NEEDED), Disp: 39 tablet, Rfl: 10   valsartan (DIOVAN) 80 MG tablet, Take 1 tablet (80 mg total) by mouth daily. (Patient taking differently: Take 80 mg by mouth daily. Take half tab daily), Disp: 90 tablet, Rfl: 3   warfarin (COUMADIN) 5 MG tablet, TAKE 1/2 TABLET DAILY OR TAKE AS DIRECTED BY ANTICOAGULATION CLINIC, Disp: 60 tablet, Rfl: 0   warfarin (COUMADIN) 5 MG tablet, TAKE 1/2 TABLET BY MOUTH DAILY EXCEPT TAKE 1 TABLET ON MONDAYS AND THURSDAYS OR AS DIRECTED BY ANTICOAGULATION CLINIC, Disp: 60 tablet, Rfl: 1   levalbuterol (XOPENEX HFA) 45 MCG/ACT inhaler, Inhale 2 puffs into the lungs every 4 (four) hours as needed for wheezing.  (Patient not taking: Reported on 12/07/2022), Disp: 3 Inhaler, Rfl: 1  Review of Systems:  Negative unless indicated in HPI.   Physical Exam: Vitals:   12/07/22 1339 12/07/22 1345 12/07/22 1413  BP: (!) 80/54 (!) 71/47 (!) 80/50  Pulse: 64    Temp: 97.6 F (36.4 C)    TempSrc: Oral    SpO2: 99%    Weight: 215 lb 1.6 oz (97.6 kg)      Body mass index is 35.25 kg/m.   Physical Exam Vitals reviewed.  Constitutional:      Appearance: Normal appearance.  HENT:     Head: Normocephalic and atraumatic.  Eyes:     Conjunctiva/sclera: Conjunctivae normal.     Pupils: Pupils are equal, round, and reactive to light.  Cardiovascular:     Rate and Rhythm: Normal rate. Rhythm irregular.  Pulmonary:     Effort: Pulmonary effort is normal.     Breath sounds: Normal breath sounds.  Skin:    General: Skin is warm and dry.  Neurological:     General: No focal deficit present.     Mental Status: She is alert and oriented to person, place, and time.  Psychiatric:        Mood and Affect: Mood normal.        Behavior: Behavior normal.        Thought Content: Thought content normal.        Judgment: Judgment normal.      Impression and Plan:  Hypotension, unspecified hypotension type  -She is hypotensive on 3 separate measures in office today.  These were both manual and automatic. -She has not been taking any of her medications for 2 weeks, her INR is 1.3 as of 2 days ago. -I believe she needs ED evaluation today for IV fluids and workup.   Time spent:33 minutes reviewing chart, interviewing and examining patient and formulating plan of care.     Chaya Jan, MD [facility]

## 2022-12-07 NOTE — H&P (Addendum)
History and Physical    [name] WUJ:[id number] DOB: 02/02/1941 DOA: 12/07/2022  PCP: Philip Aspen, Limmie Patricia, MD  Patient coming from: PCP, home  Chief Complaint: lightheadedness, SOB  HPI: [name] is a 82 y.o. 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 [name] and her goddaughter who is present with her, reports that she has been feeling lightheaded, more tired and short of breath since about Tuesday.  Her goddaughter noted that she just didn't seem right.  She reports not taking any of her medications for at least 2 weeks due to forgetting to take them and being in bed most of the day.  She notes increased urination and increased thirst/water drinking (reports just "liking" water), but not eating as much PO. She denies nausea, vomiting, fever, chills, change in the nature or color of her urine, change in bowel habits.  She does have occasional cough and occasional sputum production.  She has not had any palpitations. She denies chest pain.  She notes that she felt a little lightheaded at her doctors office, and on checking, her BP was in the 70s/40s and remained that way for 3 re-checks.    She does note "bruising" on the right arm and under the left breast.  There is discoloration in these areas.   ED Course: In the ED, she was noted to initially have low BP, but this has slowly improved.  Blood work showed a Cr of 1.39 which was up from a baseline of around 1.2, BNP was 78, TnI X 2 were 6, WBC was WNL, Hgb was 9.0, which is down from 9.6 a year ago and MCV is now < 80.  INR was 1.3, glucose was 98. CT PE protocol did show small subsegmental PE.  Heparin and coumadin per pharmacy were ordered. Platelets are normal.   Review of Systems: As per HPI otherwise all other systems reviewed and are negative.   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   rxd 3 yrs meds, no surgery in kidneys    Past Surgical History:  Procedure Laterality Date   ABDOMINAL HYSTERECTOMY     BREAST BIOPSY  04/30/2012   Procedure: BREAST BIOPSY WITH NEEDLE LOCALIZATION;  Surgeon: Emelia Loron, MD;  Location: [facility] OR;  Service: General;  Laterality: Left;  Left breast wire localization biospy   HEMORRHOID SURGERY     PACEMAKER INSERTION  02-06-2011   PPM GENERATOR CHANGEOUT N/A 04/19/2020   Procedure: PPM GENERATOR CHANGEOUT;  Surgeon: Marinus Maw, MD;  Location: [facility] INVASIVE CV LAB;  Service: Cardiovascular;  Laterality: N/A;   TAH and BSO     TONSILLECTOMY      Social History  reports that she quit smoking about 45 years ago. Her smoking use included cigarettes. She has a 4.00 pack-year smoking history. She has never used smokeless tobacco. She reports that she does  not drink alcohol and does not use drugs.  Allergies  Allergen Reactions   Albuterol Palpitations and Other (See Comments)    Irregular heartbeat      Family History  Problem Relation Age of Onset   Heart disease Mother    Cancer Sister        breast  She did not know her father.   Prior to Admission medications   Medication Sig Start Date End Date Taking? Authorizing Provider  budesonide-formoterol (SYMBICORT) 80-4.5 MCG/ACT inhaler INHALE 2 PUFFS INTO THE LUNGS 2 (TWO) TIMES DAILY. Patient taking differently: Inhale 2 puffs into the lungs 2 (two) times daily. 11/13/22  Yes Philip Aspen, Limmie Patricia, MD  metoprolol tartrate (LOPRESSOR) 25 MG tablet TAKE 1/2 TABLET TWICE DAILY Patient taking differently: Take 12.5 mg by mouth 2 (two)  times daily. 08/21/22  Yes Philip Aspen, Limmie Patricia, MD  montelukast (SINGULAIR) 10 MG tablet TAKE 1 TABLET AT BEDTIME; SCHEDULE APPT FOR FUTURE REFILLS Patient taking differently: Take 10 mg by mouth at bedtime. 05/16/22  Yes Philip Aspen, Limmie Patricia, MD  omeprazole (PRILOSEC) 20 MG capsule TAKE 1 CAPSULE EVERY DAY 30 TO 60 MINUTES BEFORE FIRST MEAL OF THE DAY Patient taking differently: Take 20 mg by mouth daily before breakfast. 11/13/22  Yes Philip Aspen, Limmie Patricia, MD  potassium chloride (KLOR-CON M) 10 MEQ tablet TAKE 1 TABLET EVERY DAY Patient taking differently: Take 10 mEq by mouth at bedtime. 06/19/22  Yes Philip Aspen, Limmie Patricia, MD  spironolactone (ALDACTONE) 25 MG tablet TAKE 1/2 TABLET EVERY DAY Patient taking differently: Take 12.5 mg by mouth at bedtime. 08/21/22  Yes Philip Aspen, Limmie Patricia, MD  torsemide (DEMADEX) 10 MG tablet TAKE 1 TABLET BY MOUTH EVERY MONDAY, WEDNESDAY, AND FRIDAY (APPOINTMENT IS NEEDED) Patient taking differently: Take 10 mg by mouth every Monday, Wednesday, and Friday. 05/29/22  Yes Philip Aspen, Limmie Patricia, MD  valsartan (DIOVAN) 160 MG tablet Take 80 mg by mouth in the morning.   Yes [provider]  warfarin (COUMADIN) 5 MG tablet TAKE 1/2 TABLET BY MOUTH DAILY EXCEPT TAKE 1 TABLET ON MONDAYS AND THURSDAYS OR AS DIRECTED BY ANTICOAGULATION CLINIC Patient taking differently: Take 2.5-5 mg by mouth See admin instructions. Take 2.5 mg by mouth in the evening on Sun/Tues/Wed/Thurs/Fri/Sat and 5 mg on Mondays 05/03/22  Yes Philip Aspen, Limmie Patricia, MD  atorvastatin (LIPITOR) 20 MG tablet Take 1 tablet (20 mg total) by mouth daily. SCHEDULE APPT FOR FUTURE REFILLS Patient not taking: Reported on 12/07/2022 05/16/22   Philip Aspen, Limmie Patricia, MD  levalbuterol [facility] HFA) 45 MCG/ACT inhaler Inhale 2 puffs into the lungs every 4 (four) hours as needed for wheezing. Patient not taking: Reported on 12/07/2022 05/31/18   Coral Ceo, NP                   Physical Exam: Vitals:   12/07/22 1855 12/07/22 1900 12/07/22 1915 12/07/22 1930  BP:  (!) 95/49 (!) 109/47 110/60  Pulse:  (!) 52 (!) 58 76  Resp:  16 15 15   Temp: 97.8 F (36.6 C)     TempSrc: Oral     SpO2:  99% 99% 99%    Constitutional: NAD, calm, comfortable Eyes: lids and conjunctivae normal ENMT: Mucous membranes are mildly dry. Posterior pharynx clear of any exudate or lesions. Neck: normal, supple Respiratory: CTAB, no wheezing, no oxygen requirement, no increased WOB Cardiovascular: RR, NR, no murmur noted.  She has a PCM in place in  left upper chest which is non tender, no erythema Abdomen: +BS, NT, ND Musculoskeletal: no clubbing or cyanosis, normal tone and bulk for age Skin: She has rounded, non raised plaques on the right arm and under the right breast, these appear to be fungal in nature, not true bruising, though they are not raised.  No petechiae.  Her legs are completely covered with leggings, unable to assess skin at this time.  Neurologic: Strength is intact, sensation intact to light touch, alert and oriented.   Psychiatric: Alert and oriented x 3. Normal mood.    Labs on Admission: I have personally reviewed following labs and imaging studies  CBC: Recent Labs  Lab 12/07/22 1537  WBC 7.2  NEUTROABS 4.1  HGB 9.0*  HCT 31.5*  MCV 78.2*  PLT 251    Basic Metabolic Panel: Recent Labs  Lab 12/07/22 1537  NA 137  K 4.7  CL 107  CO2 22  GLUCOSE 98  BUN 23  CREATININE 1.39*  CALCIUM 8.5*    GFR: CrCl cannot be calculated (Unknown ideal weight.).  Liver Function Tests: No results for input(s): "AST", "ALT", "ALKPHOS", "BILITOT", "PROT", "ALBUMIN" in the last 168 hours.  Urine analysis: pending  Radiological Exams on Admission: CT Angio Chest PE W and/or Wo Contrast  Result Date: 12/07/2022 CLINICAL DATA:  Shortness of breath. EXAM: CT ANGIOGRAPHY CHEST WITH CONTRAST TECHNIQUE: Multidetector CT imaging of the chest was  performed using the standard protocol during bolus administration of intravenous contrast. Multiplanar CT image reconstructions and MIPs were obtained to evaluate the vascular anatomy. RADIATION DOSE REDUCTION: This exam was performed according to the departmental dose-optimization program which includes automated exposure control, adjustment of the mA and/or kV according to patient size and/or use of iterative reconstruction technique. CONTRAST:  60mL OMNIPAQUE IOHEXOL 350 MG/ML SOLN COMPARISON:  X-ray earlier 12/07/2022 and older. Abdomen pelvis CT 11/22/2020. Chest CT without contrast 2011 FINDINGS: Cardiovascular: Few small emboli identified in the right lung including right lower lobe dependently series 10, image 250, middle lobe superolateral series 10, image 179 in middle lobe for example series 10, image 190 anteromedial. Overall only slight clot burden. Otherwise the heart is slightly enlarged. No pericardial effusion. Battery pack along the left upper chest with leads extending along the right side of the heart. The thoracic aorta has a normal course and caliber with mild atherosclerotic plaque. Coronary artery calcifications are seen. Slight enlargement of the right side of the heart. Mediastinum/Nodes: Normal caliber thoracic esophagus. No specific abnormal lymph node enlargement seen in the axillary region, hilum. There are some small mediastinal lymph nodes identified. These are less than 1 cm in short axis and not pathologic by size criteria. These also seen on the study of 2011. Lungs/Pleura: There is some bronchiectasis along the lung bases. Some volume loss as well in the medial left lung base. No consolidation, pneumothorax or effusion. Slight breathing motion. Upper Abdomen: The adrenal glands are preserved in the upper abdomen. Multiple stones in the gallbladder. The gallbladder is mildly distended. Please correlate for signs of gallbladder pathology. Multiple cystic lesions in the upper pole of  the right kidney with rim calcifications are also stable compared to the CT scan of the abdomen pelvis from April 2022. Musculoskeletal: Diffuse degenerative changes along the spine with bridging endplate osteophytes and syndesmophytes. Review of the MIP images confirms the above findings. IMPRESSION: Few small peripheral segmental right-sided pulmonary emboli. Slight clot burden. Enlarged heart.  Pacemaker. Areas of bronchiectasis along the lung base with  some volume loss. Distended gallbladder with stones. Please correlate with any symptomatology. Critical Value/emergent results were called by telephone at the time of interpretation on 12/07/2022 at 3:44 pm to provider MATTHEW TRIFAN , who verbally acknowledged these results. Aortic Atherosclerosis (ICD10-I70.0). Electronically Signed   By: Karen Kays M.D.   On: 12/07/2022 19:10   DG Chest 2 View  Result Date: 12/07/2022 CLINICAL DATA:  Shortness of breath. EXAM: CHEST - 2 VIEW COMPARISON:  11/02/2021. FINDINGS: Left chest dual-chamber pacemaker with leads projecting over the right atrium and ventricle. No consolidation or pulmonary edema. Stable cardiac and mediastinal contours. No pleural effusion or pneumothorax. Visualized bones and upper abdomen are unremarkable. IMPRESSION: No evidence of acute cardiopulmonary disease. Electronically Signed   By: Orvan Falconer M.D.   On: 12/07/2022 16:23    EKG: Independently reviewed. V paced, baseline difficult to interpret.    Assessment/Plan  Pulmonary emboli (HCC) - Seen on imaging, unclear if this is related to her SOB and low BP as they are subsegmental - Heparin ggt ordered per pharmacy - Coumadin per pharmacy  Shortness of breath Microcytic anemia Hypotension - Unclear if these are related, but given her microcytosis, she may be experiencing symptomatic anemia, in the setting of slow blood loss.  - Iron studies ordered - Restart home inhalers - Restart home singulair - Treatment of PE as noted  above - Check UA and FOBT for blood - I cannot find that she ever had colon cancer screening - check FOBT - Hold all blood pressure medications and diuretics - Consider iron infusion if found to be low - She has lost 10kg 7 months, which is relatively slow - Check orthostatic pressures - Blood pressure is already improving  Polyuria/Polydipsia - Check UA - Check A1C  Mild persistent asthma - Restart symbicort (dulera as formulary alternative) - Restart xopenex - Restart singulair - CXR and pulmonary exam were reassuring  Skin rash, ? Bruising - Skin appears more consistent with fungal infection vs. Dermatophyte - Will trial nystatin cream to see if this helps.     Pacemaker-[employer] Complete heart block (HCC) Chronic diastolic heart failure (HCC) - Hold diuretics in the setting of hypotension - Monitor daily weight and I/O    Essential hypertension - Hold antihypertensives in the setting of above    GERD (gastroesophageal reflux disease) - Continue PPI  Atrial fibrillation   Long term (current) use of anticoagulants - Hold beta blocker in the setting of above - Coumadin and heparin per pharmacy  Gallstones seen on imaging - Add on LFTs   DVT prophylaxis: Heparin ggt  Code Status:   Full, discussed at length with family and patient today  Family Communication:  Goddaughter at bedside  Disposition Plan:   Patient is from:  Home  Anticipated DC to:  Home  Anticipated DC date:  12/08/22  Anticipated DC barriers: Ability to ambulate well without dizziness, resolution of hypotension  Consults called:  Pharmacy  Admission status:  Obs, Telemetry   Severity of Illness: The appropriate patient status for this patient is OBSERVATION. Observation status is judged to be reasonable and necessary in order to provide the required intensity of service to ensure the patient's safety. The patient's presenting symptoms, physical exam findings, and initial radiographic and laboratory  data in the context of their medical condition is felt to place them at decreased risk for further clinical deterioration. Furthermore, it is anticipated that the patient will be medically stable for discharge from the hospital within 2  midnights of admission.     Debe Coder MD [facility]  How to contact the [facility] Attending or Consulting provider 7A - 7P or covering provider during after hours 7P -7A, for this patient?   Check the care team in [facility] and look for a) attending/consulting [facility] provider listed and b) the [facility] team listed Log into [email or web address] and use [place]'s universal password to access. If you do not have the password, please contact the hospital operator. Locate the [facility] provider you are looking for under [facility] and page to a number that you can be directly reached. If you still have difficulty reaching the provider, please page the [facility] (Director on Call) for the Hospitalists listed on amion for assistance.  12/07/2022, 8:44 PM

## 2022-12-07 NOTE — Progress Notes (Addendum)
ANTICOAGULATION CONSULT NOTE - Initial Consult  Pharmacy Consult for warfarin (changed to IV heparin) Indication: atrial fibrillation  Allergies  Allergen Reactions   Albuterol Palpitations and Other (See Comments)    Irregular heartbeat      Patient Measurements:     Vital Signs: Temp: 98.2 F (36.8 C) (05/02 1423) Temp Source: Oral (05/02 1423) BP: 100/49 (05/02 1645) Pulse Rate: 68 (05/02 1735)  Labs: Recent Labs    12/05/22 0000 12/07/22 1537 12/07/22 1737  HGB  --  9.0*  --   HCT  --  31.5*  --   PLT  --  251  --   LABPROT  --  15.7*  --   INR 1.3* 1.3*  --   CREATININE  --  1.39*  --   TROPONINIHS  --  6 6    CrCl cannot be calculated (Unknown ideal weight.).   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  She has a history that includes a-fib on warfarin prior to admission.  Today's INR subtherapeutic at 1.3, hgb 9.0 (last year baseline 10.0).    Goal of Therapy:  INR 2-3 Monitor platelets by anticoagulation protocol: Yes   Plan:  Warfarin 5 mg po x 1 this evening Monitor daily INR for warfarin dose, CBC, signs/symptoms of bleeding   Thank you for allowing pharmacy to be a part of this patient's care.  Selinda Eon, PharmD, BCPS Clinical Pharmacist [place] Please  utilize Amion for appropriate phone number to reach the unit pharmacist [facility]) 12/07/2022 6:34 PM  Addendum: 5/2 CT angio Chest: PE Pharmacy consulted to dose IV heparin Goal Heparin level 0.3-0.7  Plan: Discontinue warfarin and INR Discontinue warfarin consult; please re-consult pharmacy when ready to transition back to warfarin or an alternative anticoagulant Obtain baseline aPTT Initiate IV heparin with 2500 unit bolus per infusion then 1250 units/hour continuous infusion Obtain 8 hour heparin level to titrate infusion Monitor daily heparin level, CBC, signs/symptoms of bleeding   Selinda Eon, PharmD, BCPS Clinical Pharmacist  Please utilize Amion for appropriate phone number to reach the unit pharmacist [facility]) 12/07/2022 8:34 PM

## 2022-12-07 NOTE — ED Provider Notes (Signed)
[place] EMERGENCY DEPARTMENT AT [facility] Provider Note   CSN: [id number] Arrival date & time: 12/07/22  1418     History  Chief Complaint  Patient presents with   Shortness of Breath    [name] is a 82 y.o. female with history hypertension, hyperlipidemia, obesity, tachybradycardia syndrome, chronic heart block status post pacemaker placement, A-fib on Coumadin, COPD, heart failure, this gentleman presenting from home with low BP and shortness of breath.  Per history provided by the patient's daughter-in-law at bedside, she denies genitourinary issues.  Patient lives at home with her nephew.  Patient reports prior evaluation as she has not taken any of her home medications for about 1 to 2 weeks, although she cannot give me a reason for why.  Reports she has had about a week of worsening shortness of breath.  She denies worsening leg swelling.  She says she has significant dyspnea on exertion.  She denies lightheadedness or chest pressure  Reported typically she has high blood pressure  Today the patient was seen at her primary care doctor's office, where there was reported concern that the patient has not been on her medications, her INR is subtherapeutic, and she was hypotensive on 3 repeat blood pressure checks, as low as 71/47, and then 80/54 at the time of transfer to ED. HPI     Home Medications Prior to Admission medications   Medication Sig Start Date End Date Taking? Authorizing Provider  atorvastatin (LIPITOR) 20 MG tablet Take 1 tablet (20 mg total) by mouth daily. SCHEDULE APPT FOR FUTURE REFILLS Patient taking differently: Take 20 mg by mouth daily. 05/16/22   Philip Aspen, Limmie Patricia, MD  budesonide-formoterol (SYMBICORT) 80-4.5 MCG/ACT inhaler INHALE 2 PUFFS INTO THE LUNGS 2 (TWO) TIMES DAILY. 11/13/22   Philip Aspen, Limmie Patricia, MD  levalbuterol [facility] HFA) 45 MCG/ACT inhaler Inhale 2 puffs into the lungs every 4 (four) hours as needed for  wheezing. 05/31/18   Coral Ceo, NP  metoprolol tartrate (LOPRESSOR) 25 MG tablet TAKE 1/2 TABLET TWICE DAILY 08/21/22   Philip Aspen, Limmie Patricia, MD  montelukast (SINGULAIR) 10 MG tablet TAKE 1 TABLET AT BEDTIME; SCHEDULE APPT FOR FUTURE REFILLS 05/16/22   Philip Aspen, Limmie Patricia, MD  omeprazole (PRILOSEC) 20 MG capsule TAKE 1 CAPSULE EVERY DAY 30 TO 60 MINUTES BEFORE FIRST MEAL OF THE DAY 11/13/22   Philip Aspen, Limmie Patricia, MD  potassium chloride (KLOR-CON M) 10 MEQ tablet TAKE 1 TABLET EVERY DAY 06/19/22   Philip Aspen, Limmie Patricia, MD  spironolactone (ALDACTONE) 25 MG tablet TAKE 1/2 TABLET EVERY DAY 08/21/22   Philip Aspen, Limmie Patricia, MD  torsemide (DEMADEX) 10 MG tablet TAKE 1 TABLET BY MOUTH EVERY MONDAY, WEDNESDAY, AND FRIDAY (APPOINTMENT IS NEEDED) 05/29/22   Philip Aspen, Limmie Patricia, MD  valsartan (DIOVAN) 80 MG tablet Take 1 tablet (80 mg total) by mouth daily. Patient taking differently: Take 40-80 mg by mouth daily. 05/16/21   Chilton Si, MD  warfarin (COUMADIN) 5 MG tablet TAKE 1/2 TABLET DAILY OR TAKE AS DIRECTED BY ANTICOAGULATION CLINIC Patient not taking: Reported on 12/07/2022 05/03/22   Philip Aspen, Limmie Patricia, MD  warfarin (COUMADIN) 5 MG tablet TAKE 1/2 TABLET BY MOUTH DAILY EXCEPT TAKE 1 TABLET ON MONDAYS AND THURSDAYS OR AS DIRECTED BY ANTICOAGULATION CLINIC 05/03/22   Philip Aspen, Limmie Patricia, MD      Allergies    Albuterol    Review of Systems   Review of Systems  Physical Exam Updated Vital Signs BP (!) 100/49   Pulse 68   Temp 98.2 F (36.8 C) (Oral)   Resp 19   SpO2 99%  Physical Exam Constitutional:      General: She is not in acute distress.    Appearance: She is obese.  HENT:     Head: Normocephalic and atraumatic.  Eyes:     Conjunctiva/sclera: Conjunctivae normal.     Pupils: Pupils are equal, round, and reactive to light.  Cardiovascular:     Rate and Rhythm: Normal rate and regular rhythm.  Pulmonary:     Effort:  Pulmonary effort is normal. No respiratory distress.  Abdominal:     General: There is no distension.     Tenderness: There is no abdominal tenderness.  Skin:    General: Skin is warm and dry.  Neurological:     General: No focal deficit present.     Mental Status: She is alert. Mental status is at baseline.  Psychiatric:        Mood and Affect: Mood normal.        Behavior: Behavior normal.     ED Results / Procedures / Treatments   Labs (all labs ordered are listed, but only abnormal results are displayed) Labs Reviewed  BASIC METABOLIC PANEL - Abnormal; Notable for the following components:      Result Value   Creatinine, Ser 1.39 (*)    Calcium 8.5 (*)    GFR, Estimated 37 (*)    All other components within normal limits  CBC WITH DIFFERENTIAL/PLATELET - Abnormal; Notable for the following components:   Hemoglobin 9.0 (*)    HCT 31.5 (*)    MCV 78.2 (*)    MCH 22.3 (*)    MCHC 28.6 (*)    RDW 21.3 (*)    All other components within normal limits  PROTIME-INR - Abnormal; Notable for the following components:   Prothrombin Time 15.7 (*)    INR 1.3 (*)    All other components within normal limits  SARS CORONAVIRUS 2 BY RT PCR  BRAIN NATRIURETIC PEPTIDE  TROPONIN I (HIGH SENSITIVITY)  TROPONIN I (HIGH SENSITIVITY)    EKG EKG Interpretation  Date/Time:  Thursday Dec 07 2022 15:40:43 EDT Ventricular Rate:  112 PR Interval:    QRS Duration: 139 QT Interval:  408 QTC Calculation: 557 R Axis:   -1 Text Interpretation: V paced rhythm Confirmed by Alvester Chou [id number]) on 12/07/2022 4:13:18 PM  Radiology DG Chest 2 View  Result Date: 12/07/2022 CLINICAL DATA:  Shortness of breath. EXAM: CHEST - 2 VIEW COMPARISON:  11/02/2021. FINDINGS: Left chest dual-chamber pacemaker with leads projecting over the right atrium and ventricle. No consolidation or pulmonary edema. Stable cardiac and mediastinal contours. No pleural effusion or pneumothorax. Visualized bones and upper  abdomen are unremarkable. IMPRESSION: No evidence of acute cardiopulmonary disease. Electronically Signed   By: Orvan Falconer M.D.   On: 12/07/2022 16:23    Procedures Procedures    Medications Ordered in ED Medications  sodium chloride 0.9 % bolus 500 mL (500 mLs Intravenous New Bag/Given 12/07/22 1536)  metoprolol tartrate (LOPRESSOR) tablet 12.5 mg (12.5 mg Oral Given 12/07/22 1735)    ED Course/ Medical Decision Making/ A&P Clinical Course as of 12/07/22 1754  Thu Dec 07, 2022  1712 Signed out to Dr Rodena Medin EDP pending reassessment after CT PE study, BP after fluids, metoprolol [MT]    Clinical Course User Index [MT] Renaye Rakers Kermit Balo, MD  Medical Decision Making Amount and/or Complexity of Data Reviewed Labs: ordered. Radiology: ordered. ECG/medicine tests: ordered.  Risk Prescription drug management.   This patient presents to the ED with concern for shortness of breath/dyspnea on exertion. This involves an extensive number of treatment options, and is a complaint that carries with it a high risk of complications and morbidity.  The differential diagnosis includes CHF vs anemia vs pleural effusion vs pneumonia vs PE vs other  Co-morbidities that complicate the patient evaluation: hx of A fib, unmedicated, at higher risk of arrhythmia; hx of CHF  Additional history obtained from patient's family at bedside  External records from outside source obtained and reviewed including pacemaker evaluation 07/17/22 with controlled A fib burden <1%  I ordered and personally interpreted labs.  The pertinent results include:  Cr 1.39 near baseline, hgb 9.0 (last year baseline 10.0), wbc wnl; trop 6, bnp 78, covid negative.  INR 1.3 - subtherapeutic  I ordered imaging studies including dg chest, CT PE I independently visualized and interpreted imaging which showed no acute findings on xray, CT imaging pending at the time of signout I agree with the  radiologist interpretation  The patient was maintained on a cardiac monitor.  I personally viewed and interpreted the cardiac monitored which showed an underlying rhythm of: rate controlled A fib originally, and subsequently A fib with RVR  Per my interpretation the patient's ECG shows paced rhythm  I ordered medication including IV fluid bolus for hypotension, metoprolol for A fib with RVR  After the interventions noted above, I reevaluated the patient and found that they have: improved  BP improved in her early stay in the ED after fluid bolus.  Subsequently patient went into A fib with RVR.  I ordered home metoprolol dosing.  She is pending CT PE study after reporting lightheadedness and dyspnea attempting to walk to the bathroom.  She is signed out to Dr Kristine Royal EDP at 5 pm pending reassessment of vital signs after fluids and medications, and CT PE.  I expressed clearly to her goddaughter at bedside the need for careful home monitoring of medications.  She tells me that the family can ensure patient's compliance with meds at home.         Final Clinical Impression(s) / ED Diagnoses Final diagnoses:  None    Rx / DC Orders ED Discharge Orders     None         Zylie Mumaw, Kermit Balo, MD 12/07/22 1754

## 2022-12-08 ENCOUNTER — Observation Stay (HOSPITAL_BASED_OUTPATIENT_CLINIC_OR_DEPARTMENT_OTHER): Payer: Medicare HMO

## 2022-12-08 DIAGNOSIS — Z7901 Long term (current) use of anticoagulants: Secondary | ICD-10-CM

## 2022-12-08 DIAGNOSIS — I5032 Chronic diastolic (congestive) heart failure: Secondary | ICD-10-CM

## 2022-12-08 DIAGNOSIS — Z86711 Personal history of pulmonary embolism: Secondary | ICD-10-CM

## 2022-12-08 DIAGNOSIS — I442 Atrioventricular block, complete: Secondary | ICD-10-CM | POA: Diagnosis not present

## 2022-12-08 DIAGNOSIS — R0602 Shortness of breath: Secondary | ICD-10-CM | POA: Diagnosis not present

## 2022-12-08 DIAGNOSIS — K219 Gastro-esophageal reflux disease without esophagitis: Secondary | ICD-10-CM

## 2022-12-08 DIAGNOSIS — Z950 Presence of cardiac pacemaker: Secondary | ICD-10-CM

## 2022-12-08 DIAGNOSIS — I4891 Unspecified atrial fibrillation: Secondary | ICD-10-CM

## 2022-12-08 DIAGNOSIS — I10 Essential (primary) hypertension: Secondary | ICD-10-CM

## 2022-12-08 DIAGNOSIS — J4530 Mild persistent asthma, uncomplicated: Secondary | ICD-10-CM

## 2022-12-08 DIAGNOSIS — I2699 Other pulmonary embolism without acute cor pulmonale: Secondary | ICD-10-CM

## 2022-12-08 LAB — HEPARIN LEVEL (UNFRACTIONATED)
Heparin Unfractionated: 1.02 IU/mL — ABNORMAL HIGH (ref 0.30–0.70)
Heparin Unfractionated: >1.1 IU/mL — ABNORMAL HIGH (ref 0.30–0.70)

## 2022-12-08 LAB — ECHOCARDIOGRAM COMPLETE
AR max vel: 3.61 cm2
AV Area VTI: 4.79 cm2
AV Area mean vel: 3.22 cm2
AV Mean grad: 2 mmHg
AV Peak grad: 3.3 mmHg
Ao pk vel: 0.9 m/s
Area-P 1/2: 5.27 cm2
S' Lateral: 2.2 cm
Single Plane A4C EF: 50.3 %
Weight: 3441.6 oz

## 2022-12-08 LAB — HEPATIC FUNCTION PANEL
ALT: 15 U/L (ref 0–44)
AST: 24 U/L (ref 15–41)
Albumin: 3 g/dL — ABNORMAL LOW (ref 3.5–5.0)
Alkaline Phosphatase: 117 U/L (ref 38–126)
Bilirubin, Direct: 0.2 mg/dL (ref 0.0–0.2)
Indirect Bilirubin: 0.8 mg/dL (ref 0.3–0.9)
Total Bilirubin: 1 mg/dL (ref 0.3–1.2)
Total Protein: 6.4 g/dL — ABNORMAL LOW (ref 6.5–8.1)

## 2022-12-08 LAB — CBC
HCT: 30.9 % — ABNORMAL LOW (ref 36.0–46.0)
Hemoglobin: 9.1 g/dL — ABNORMAL LOW (ref 12.0–15.0)
MCH: 23 pg — ABNORMAL LOW (ref 26.0–34.0)
MCHC: 29.4 g/dL — ABNORMAL LOW (ref 30.0–36.0)
MCV: 78.2 fL — ABNORMAL LOW (ref 80.0–100.0)
Platelets: 250 10*3/uL (ref 150–400)
RBC: 3.95 MIL/uL (ref 3.87–5.11)
RDW: 21.2 % — ABNORMAL HIGH (ref 11.5–15.5)
WBC: 6.8 10*3/uL (ref 4.0–10.5)
nRBC: 0 % (ref 0.0–0.2)

## 2022-12-08 LAB — URINALYSIS, ROUTINE W REFLEX MICROSCOPIC
Bilirubin Urine: NEGATIVE
Glucose, UA: NEGATIVE mg/dL
Hgb urine dipstick: NEGATIVE
Ketones, ur: NEGATIVE mg/dL
Nitrite: NEGATIVE
Protein, ur: NEGATIVE mg/dL
Specific Gravity, Urine: 1.024 (ref 1.005–1.030)
WBC, UA: >50 WBC/hpf (ref 0–5)
pH: 5 (ref 5.0–8.0)

## 2022-12-08 LAB — BASIC METABOLIC PANEL
Anion gap: 8 (ref 5–15)
BUN: 22 mg/dL (ref 8–23)
CO2: 20 mmol/L — ABNORMAL LOW (ref 22–32)
Calcium: 8.4 mg/dL — ABNORMAL LOW (ref 8.9–10.3)
Chloride: 109 mmol/L (ref 98–111)
Creatinine, Ser: 1.3 mg/dL — ABNORMAL HIGH (ref 0.44–1.00)
GFR, Estimated: 41 mL/min — ABNORMAL LOW (ref >60)
Glucose, Bld: 89 mg/dL (ref 70–99)
Potassium: 4.1 mmol/L (ref 3.5–5.1)
Sodium: 137 mmol/L (ref 135–145)

## 2022-12-08 LAB — CORTISOL: Cortisol, Plasma: 15.1 ug/dL

## 2022-12-08 LAB — FERRITIN: Ferritin: 8 ng/mL — ABNORMAL LOW (ref 11–307)

## 2022-12-08 LAB — IRON AND TIBC
Iron: 20 ug/dL — ABNORMAL LOW (ref 28–170)
Saturation Ratios: 6 % — ABNORMAL LOW (ref 10.4–31.8)
TIBC: 363 ug/dL (ref 250–450)
UIBC: 343 ug/dL

## 2022-12-08 LAB — CBG MONITORING, ED: Glucose-Capillary: 93 mg/dL (ref 70–99)

## 2022-12-08 LAB — TSH: TSH: 1.752 u[IU]/mL (ref 0.350–4.500)

## 2022-12-08 LAB — HEMOGLOBIN A1C
Hgb A1c MFr Bld: 5.2 % (ref 4.8–5.6)
Mean Plasma Glucose: 102.54 mg/dL

## 2022-12-08 MED ORDER — ACETAMINOPHEN 325 MG PO TABS: 650.0000 mg | ORAL_TABLET | Freq: Four times a day (QID) | ORAL | Status: DC | PRN

## 2022-12-08 MED ORDER — NYSTATIN 100000 UNIT/GM EX CREA
TOPICAL_CREAM | Freq: Two times a day (BID) | CUTANEOUS | Status: DC
  Filled 2022-12-08 (×2): qty 30

## 2022-12-08 MED ORDER — MONTELUKAST SODIUM 10 MG PO TABS
10.0000 mg | ORAL_TABLET | Freq: Every day | ORAL | Status: DC
  Administered 2022-12-08: 10 mg via ORAL
  Filled 2022-12-08: qty 1

## 2022-12-08 MED ORDER — HEPARIN (PORCINE) 25000 UT/250ML-% IV SOLN
600.0000 [IU]/h | INTRAVENOUS | Status: AC
  Administered 2022-12-08: 800 [IU]/h via INTRAVENOUS
  Filled 2022-12-08: qty 250

## 2022-12-08 MED ORDER — SODIUM CHLORIDE 0.9% FLUSH
3.0000 mL | Freq: Two times a day (BID) | INTRAVENOUS | Status: DC
  Administered 2022-12-08 (×3): 3 mL via INTRAVENOUS

## 2022-12-08 MED ORDER — ONDANSETRON HCL 4 MG/2ML IJ SOLN: 4.0000 mg | Freq: Four times a day (QID) | INTRAMUSCULAR | Status: DC | PRN

## 2022-12-08 MED ORDER — TRAZODONE HCL 50 MG PO TABS: 25.0000 mg | ORAL_TABLET | Freq: Every evening | ORAL | Status: DC | PRN

## 2022-12-08 MED ORDER — ACETAMINOPHEN 650 MG RE SUPP: 650.0000 mg | Freq: Four times a day (QID) | RECTAL | Status: DC | PRN

## 2022-12-08 MED ORDER — MOMETASONE FURO-FORMOTEROL FUM 100-5 MCG/ACT IN AERO
2.0000 | INHALATION_SPRAY | Freq: Two times a day (BID) | RESPIRATORY_TRACT | Status: DC
  Administered 2022-12-08 – 2022-12-09 (×3): 2 via RESPIRATORY_TRACT
  Filled 2022-12-08: qty 8.8

## 2022-12-08 MED ORDER — SODIUM CHLORIDE 0.9 % IV SOLN
250.0000 mg | Freq: Every day | INTRAVENOUS | Status: DC
  Administered 2022-12-09: 250 mg via INTRAVENOUS
  Filled 2022-12-08 (×2): qty 20

## 2022-12-08 MED ORDER — ONDANSETRON HCL 4 MG PO TABS: 4.0000 mg | ORAL_TABLET | Freq: Four times a day (QID) | ORAL | Status: DC | PRN

## 2022-12-08 MED ORDER — PANTOPRAZOLE SODIUM 40 MG PO TBEC
40.0000 mg | DELAYED_RELEASE_TABLET | Freq: Every day | ORAL | Status: DC
  Administered 2022-12-08 – 2022-12-09 (×2): 40 mg via ORAL
  Filled 2022-12-08 (×2): qty 1

## 2022-12-08 MED ORDER — SODIUM CHLORIDE 0.9 % IV SOLN: INTRAVENOUS | Status: DC

## 2022-12-08 MED ORDER — HEPARIN (PORCINE) 25000 UT/250ML-% IV SOLN
1000.0000 [IU]/h | INTRAVENOUS | Status: DC
  Administered 2022-12-08: 1000 [IU]/h via INTRAVENOUS

## 2022-12-08 NOTE — ED Notes (Signed)
Pt given sandwich and a drink.  

## 2022-12-08 NOTE — Progress Notes (Signed)
PROGRESS NOTE  [name] JXB:[id number] DOB: 29-Aug-1940   PCP: Philip Aspen, Limmie Patricia, MD  Patient is from: Home.  Lives alone.  Uses cane at baseline.  DOA: 12/07/2022 LOS: 0  Chief complaints Chief Complaint  Patient presents with   Shortness of Breath     Brief Narrative / Interim history: 82 year old F with PMH of A-fib on warfarin, AVB s/p PPM, HFpEF, HTN, asthma, GERD and obesity presenting with low blood pressure,, shortness of breath, DOE, lightheadedness, increased urination and thirst, and admitted for small subsegmental PE, hypertension and generalized weakness.  BP low at 70's/40's discharge PCP office.  Reportedly not taking his warfarin consistently for the last 2 weeks.  Patient was started on IV heparin for anticoagulation.  Started on IV fluid for hypotension.  The next day, reports improvement in his symptoms.  Subjective: Seen and examined earlier this morning.  No major events overnight of this morning.  Feels better today.  Less winded with ambulation.  Denies SOB at rest.  Denies chest pain, GI or UTI symptoms.  She denies melena or hematochezia.  Objective: Vitals:   12/08/22 0745 12/08/22 0800 12/08/22 0803 12/08/22 1037  BP: (!) 100/59 (!) 92/55  104/63  Pulse: (!) 59 (!) 58  60  Resp:    15  Temp:   98.2 F (36.8 C) (!) 97.4 F (36.3 C)  TempSrc:    Oral  SpO2: 98% 99%  100%    Examination:  GENERAL: No apparent distress.  Nontoxic. HEENT: MMM.  Vision and hearing grossly intact.  NECK: Supple.  No apparent JVD.  RESP:  No IWOB.  Fair aeration bilaterally. CVS:  RRR. Heart sounds normal.  ABD/GI/GU: BS+. Abd soft, NTND.  MSK/EXT:  Moves extremities. No apparent deformity.  1+ BLE edema (chronic per patient). SKIN: no apparent skin lesion or wound NEURO: Awake, alert and oriented appropriately.  No apparent focal neuro deficit. PSYCH: Calm. Normal affect.   Procedures:  None  Microbiology summarized: COVID-19 PCR  nonreactive.  Assessment and plan: Principal Problem:   Pulmonary emboli (HCC) Active Problems:   Mild persistent asthma   Pacemaker-St.Jude   Complete heart block (HCC)   Chronic diastolic heart failure (HCC)   Essential hypertension   GERD (gastroesophageal reflux disease)   Shortness of breath   Long term (current) use of anticoagulants  Small subsegmental pulmonary embolism: Noted on CTA chest.  Doubt this is the cause of his symptoms or presentation.  Supposedly on warfarin for atrial fibrillation but has not been taking consistently over the last 2 weeks.  INR subtherapeutic at 1.3. -Continue IV heparin for anticoagulation -Hold warfarin pending echocardiogram and lower extremity venous Doppler    Shortness of breath/dyspnea on exertion/lightheadedness/generalized weakness: Likely due to hypotension and anemia.  BNP and serial troponin negative.  She has BLE edema but chronic.  TSH within normal.  She also have some degree of anemia -Follow echocardiogram -Treat PE as above -Check cortisol level  Microcytic iron deficiency anemia: Hgb 9.0 (9.6 about a year ago).  Patient denies melena or hematochezia.  Anemia panel suggestive of severe iron deficiency.  H&H stable. Recent Labs    12/07/22 1537 12/08/22 0430  HGB 9.0* 9.1*  -IV ferric gluconate 250 mg daily x 2 -Monitor CBC daily -Follow Hemoccult  Atrial fibrillation: EKG V paced rhythm at 112.  Currently with borderline bradycardia.  On warfarin for anticoagulation but INR subtherapeutic. -Continue holding beta-blocker due to hypotension and borderline bradycardia -IV heparin for anticoagulation -  Continue holding warfarin pending echocardiogram  Hypotension: Likely iatrogenic in the setting of poor p.o. intake. -Continue holding diuretics and home antihypertensive meds -IV fluid -Follow echocardiogram and cortisol level -Check orthostatic vitals  Chronic HFpEF: Appears euvolemic except for BLE edema (chronic).   BNP and serial troponin negative. -Hold diuretics -Closely monitor fluid status and respiratory status while on IV fluid   Polyuria/Polydipsia: Not diabetic.  A1c 5.2%.  TSH 1.7.  Due to diuretics?  UA with large LE but rare bacteria and negative nitrite.  Patient denies UTI symptoms.  Seems to have improved.  Mild persistent asthma: Stable. Elwin Sleight and steroid from Symbicort -Continue Xopenex and Singulair.   Skin rash, ? Bruising - Skin appears more consistent with fungal infection vs. Dermatophyte - Will trial nystatin cream to see if this helps.     History of AVB/CHB s/p PPM-[employer] Jude.   Essential hypertension -See hypotension.    GERD  -Continue PPI   Gallstones seen on imaging  Generalized weakness/physical deconditioning -PT/OT eval  There is no height or weight on file to calculate BMI.           DVT prophylaxis:  On full dose anticoagulation for PE and A-fib  Code Status: Full code Family Communication: None at bedside Level of care: Telemetry Status is: Observation The patient will require care spanning > 2 midnights and should be moved to inpatient because: Acute PE, hypertension, generalized weakness and iron deficiency anemia   Final disposition: Likely home in the next 24 to 48 hours Consultants:  None  55 minutes with more than 50% spent in reviewing records, counseling patient/family and coordinating care.   Sch Meds:  Scheduled Meds:  mometasone-formoterol  2 puff Inhalation BID   montelukast  10 mg Oral QHS   nystatin cream   Topical BID   pantoprazole  40 mg Oral Daily   sodium chloride flush  3 mL Intravenous Q12H   Continuous Infusions:  sodium chloride 75 mL/hr at 12/08/22 1029   ferric gluconate (FERRLECIT) IVPB     heparin 1,000 Units/hr (12/08/22 0716)   PRN Meds:.acetaminophen **OR** acetaminophen, ondansetron **OR** ondansetron (ZOFRAN) IV, traZODone  Antimicrobials: Anti-infectives (From admission, onward)    None         I have personally reviewed the following labs and images: CBC: Recent Labs  Lab 12/07/22 1537 12/08/22 0430  WBC 7.2 6.8  NEUTROABS 4.1  --   HGB 9.0* 9.1*  HCT 31.5* 30.9*  MCV 78.2* 78.2*  PLT 251 250   BMP &GFR Recent Labs  Lab 12/07/22 1537 12/08/22 0430  NA 137 137  K 4.7 4.1  CL 107 109  CO2 22 20*  GLUCOSE 98 89  BUN 23 22  CREATININE 1.39* 1.30*  CALCIUM 8.5* 8.4*   CrCl cannot be calculated (Unknown ideal weight.). Liver & Pancreas: Recent Labs  Lab 12/07/22 1737  AST 24  ALT 15  ALKPHOS 117  BILITOT 1.0  PROT 6.4*  ALBUMIN 3.0*   No results for input(s): "LIPASE", "AMYLASE" in the last 168 hours. No results for input(s): "AMMONIA" in the last 168 hours. Diabetic: Recent Labs    12/08/22 0430  HGBA1C 5.2   Recent Labs  Lab 12/08/22 0810  GLUCAP 93   Cardiac Enzymes: No results for input(s): "CKTOTAL", "CKMB", "CKMBINDEX", "TROPONINI" in the last 168 hours. No results for input(s): "PROBNP" in the last 8760 hours. Coagulation Profile: Recent Labs  Lab 12/05/22 0000 12/07/22 1537  INR 1.3* 1.3*   Thyroid Function  Tests: Recent Labs    12/08/22 0430  TSH 1.752   Lipid Profile: No results for input(s): "CHOL", "HDL", "LDLCALC", "TRIG", "CHOLHDL", "LDLDIRECT" in the last 72 hours. Anemia Panel: Recent Labs    12/08/22 0430  FERRITIN 8*  TIBC 363  IRON 20*   Urine analysis:    Component Value Date/Time   COLORURINE YELLOW 12/08/2022 0926   APPEARANCEUR HAZY (A) 12/08/2022 0926   LABSPEC 1.024 12/08/2022 0926   PHURINE 5.0 12/08/2022 0926   GLUCOSEU NEGATIVE 12/08/2022 0926   HGBUR NEGATIVE 12/08/2022 0926   BILIRUBINUR NEGATIVE 12/08/2022 0926   KETONESUR NEGATIVE 12/08/2022 0926   PROTEINUR NEGATIVE 12/08/2022 0926   NITRITE NEGATIVE 12/08/2022 0926   LEUKOCYTESUR LARGE (A) 12/08/2022 0926   Sepsis Labs: Invalid input(s): "PROCALCITONIN", "LACTICIDVEN"  Microbiology: Recent Results (from the past 240 hour(s))   SARS Coronavirus 2 by RT PCR (hospital order, performed in [facility] hospital lab) *cepheid single result test* Anterior Nasal Swab     Status: None   Collection Time: 12/07/22  3:37 PM   Specimen: Anterior Nasal Swab  Result Value Ref Range Status   SARS Coronavirus 2 by RT PCR NEGATIVE NEGATIVE Final    Comment: (NOTE) SARS-CoV-2 target nucleic acids are NOT DETECTED.  The SARS-CoV-2 RNA is generally detectable in upper and lower respiratory specimens during the acute phase of infection. The lowest concentration of SARS-CoV-2 viral copies this assay can detect is 250 copies / mL. A negative result does not preclude SARS-CoV-2 infection and should not be used as the sole basis for treatment or other patient management decisions.  A negative result may occur with improper specimen collection / handling, submission of specimen other than nasopharyngeal swab, presence of viral mutation(s) within the areas targeted by this assay, and inadequate number of viral copies (<250 copies / mL). A negative result must be combined with clinical observations, patient history, and epidemiological information.  Fact Sheet for Patients:   RoadLapTop.co.za  Fact Sheet for Healthcare Providers: http://kim-miller.com/  This test is not yet approved or  cleared by the [place] FDA and has been authorized for detection and/or diagnosis of SARS-CoV-2 by FDA under an Emergency Use Authorization (EUA).  This EUA will remain in effect (meaning this test can be used) for the duration of the COVID-19 declaration under Section 564(b)(1) of the Act, 21 U.S.C. section 360bbb-3(b)(1), unless the authorization is terminated or revoked sooner.  Performed at [facility], [address], [place], [place] [address]     Radiology Studies: VAS [place] LOWER EXTREMITY VENOUS (DVT)  Result Date: 12/08/2022  Lower Venous DVT Study Patient Name:  [facility] [name]  Date of Exam:   12/08/2022 Medical Rec #: [id number]      Accession #:    [id number] Date of Birth: Jan 25, 1941       Patient Gender: F Patient Age:   49 years Exam Location:  [facility] Procedure:      VAS [place] LOWER EXTREMITY VENOUS (DVT) Referring Phys: Alwyn Ren Cannon Quinton --------------------------------------------------------------------------------  Indications: Pulmonary embolism.  Risk Factors: Confirmed PE. Anticoagulation: Heparin. Limitations: Body habitus and poor ultrasound/tissue interface. Comparison Study: No prior studies. Performing Technologist: Chanda Busing RVT  Examination Guidelines: A complete evaluation includes B-mode imaging, spectral Doppler, color Doppler, and power Doppler as needed of all accessible portions of each vessel. Bilateral testing is considered an integral part of a complete examination. Limited examinations for reoccurring indications may be performed as noted. The reflux portion of the  exam is performed with the patient in reverse Trendelenburg.  +---------+---------------+---------+-----------+----------+-------------------+ RIGHT    CompressibilityPhasicitySpontaneityPropertiesThrombus Aging      +---------+---------------+---------+-----------+----------+-------------------+ CFV      Full           Yes      Yes                                      +---------+---------------+---------+-----------+----------+-------------------+ SFJ      Full                                                             +---------+---------------+---------+-----------+----------+-------------------+ FV Prox  Full                                                             +---------+---------------+---------+-----------+----------+-------------------+ FV Mid   Full                                                             +---------+---------------+---------+-----------+----------+-------------------+ FV DistalFull                                                              +---------+---------------+---------+-----------+----------+-------------------+ PFV      Full                                                             +---------+---------------+---------+-----------+----------+-------------------+ POP      Full           Yes      Yes                                      +---------+---------------+---------+-----------+----------+-------------------+ PTV      Full                                                             +---------+---------------+---------+-----------+----------+-------------------+ PERO                                                  Not well visualized +---------+---------------+---------+-----------+----------+-------------------+   +---------+---------------+---------+-----------+----------+-------------------+  LEFT     CompressibilityPhasicitySpontaneityPropertiesThrombus Aging      +---------+---------------+---------+-----------+----------+-------------------+ CFV      Full           Yes      Yes                                      +---------+---------------+---------+-----------+----------+-------------------+ SFJ      Full                                                             +---------+---------------+---------+-----------+----------+-------------------+ FV Prox  Full                                                             +---------+---------------+---------+-----------+----------+-------------------+ FV Mid   Full                                                             +---------+---------------+---------+-----------+----------+-------------------+ FV DistalFull                                                             +---------+---------------+---------+-----------+----------+-------------------+ PFV      Full                                                              +---------+---------------+---------+-----------+----------+-------------------+ POP      Full           Yes      Yes                                      +---------+---------------+---------+-----------+----------+-------------------+ PTV      Full                                                             +---------+---------------+---------+-----------+----------+-------------------+ PERO                                                  Not well visualized +---------+---------------+---------+-----------+----------+-------------------+     Summary: RIGHT: - There is no  evidence of deep vein thrombosis in the lower extremity. However, portions of this examination were limited- see technologist comments above.  - No cystic structure found in the popliteal fossa.  LEFT: - There is no evidence of deep vein thrombosis in the lower extremity. However, portions of this examination were limited- see technologist comments above.  - No cystic structure found in the popliteal fossa.  *See table(s) above for measurements and observations.    Preliminary    CT Angio Chest PE W and/or Wo Contrast  Result Date: 12/07/2022 CLINICAL DATA:  Shortness of breath. EXAM: CT ANGIOGRAPHY CHEST WITH CONTRAST TECHNIQUE: Multidetector CT imaging of the chest was performed using the standard protocol during bolus administration of intravenous contrast. Multiplanar CT image reconstructions and MIPs were obtained to evaluate the vascular anatomy. RADIATION DOSE REDUCTION: This exam was performed according to the departmental dose-optimization program which includes automated exposure control, adjustment of the mA and/or kV according to patient size and/or use of iterative reconstruction technique. CONTRAST:  60mL OMNIPAQUE IOHEXOL 350 MG/ML SOLN COMPARISON:  X-ray earlier 12/07/2022 and older. Abdomen pelvis CT 11/22/2020. Chest CT without contrast 2011 FINDINGS: Cardiovascular: Few small emboli identified in the  right lung including right lower lobe dependently series 10, image 250, middle lobe superolateral series 10, image 179 in middle lobe for example series 10, image 190 anteromedial. Overall only slight clot burden. Otherwise the heart is slightly enlarged. No pericardial effusion. Battery pack along the left upper chest with leads extending along the right side of the heart. The thoracic aorta has a normal course and caliber with mild atherosclerotic plaque. Coronary artery calcifications are seen. Slight enlargement of the right side of the heart. Mediastinum/Nodes: Normal caliber thoracic esophagus. No specific abnormal lymph node enlargement seen in the axillary region, hilum. There are some small mediastinal lymph nodes identified. These are less than 1 cm in short axis and not pathologic by size criteria. These also seen on the study of 2011. Lungs/Pleura: There is some bronchiectasis along the lung bases. Some volume loss as well in the medial left lung base. No consolidation, pneumothorax or effusion. Slight breathing motion. Upper Abdomen: The adrenal glands are preserved in the upper abdomen. Multiple stones in the gallbladder. The gallbladder is mildly distended. Please correlate for signs of gallbladder pathology. Multiple cystic lesions in the upper pole of the right kidney with rim calcifications are also stable compared to the CT scan of the abdomen pelvis from April 2022. Musculoskeletal: Diffuse degenerative changes along the spine with bridging endplate osteophytes and syndesmophytes. Review of the MIP images confirms the above findings. IMPRESSION: Few small peripheral segmental right-sided pulmonary emboli. Slight clot burden. Enlarged heart.  Pacemaker. Areas of bronchiectasis along the lung base with some volume loss. Distended gallbladder with stones. Please correlate with any symptomatology. Critical Value/emergent results were called by telephone at the time of interpretation on 12/07/2022 at  3:44 pm to provider MATTHEW TRIFAN , who verbally acknowledged these results. Aortic Atherosclerosis (ICD10-I70.0). Electronically Signed   By: Karen Kays M.D.   On: 12/07/2022 19:10   DG Chest 2 View  Result Date: 12/07/2022 CLINICAL DATA:  Shortness of breath. EXAM: CHEST - 2 VIEW COMPARISON:  11/02/2021. FINDINGS: Left chest dual-chamber pacemaker with leads projecting over the right atrium and ventricle. No consolidation or pulmonary edema. Stable cardiac and mediastinal contours. No pleural effusion or pneumothorax. Visualized bones and upper abdomen are unremarkable. IMPRESSION: No evidence of acute cardiopulmonary disease. Electronically Signed   By: Orvan Falconer  M.D.   On: 12/07/2022 16:23      Caius Silbernagel T. Shyniece Scripter [facility]  If 7PM-7AM, please contact night-coverage [email or web address] 12/08/2022, 12:43 PM

## 2022-12-08 NOTE — Progress Notes (Signed)
ANTICOAGULATION CONSULT NOTE - Follow Up Consult  Pharmacy Consult for heparin Indication: pulmonary embolus  Allergies  Allergen Reactions   Albuterol Palpitations and Other (See Comments)    Irregular heartbeat      Patient Measurements:   Heparin Dosing Weight: 97.6kg   Vital Signs: Temp: 97.8 F (36.6 C) (05/03 1500) Temp Source: Oral (05/03 1500) BP: 108/74 (05/03 1500) Pulse Rate: 81 (05/03 1500)  Labs: Recent Labs    12/07/22 1537 12/07/22 1737 12/07/22 1955 12/08/22 0430 12/08/22 1632  HGB 9.0*  --   --  9.1*  --   HCT 31.5*  --   --  30.9*  --   PLT 251  --   --  250  --   APTT  --   --  32  --   --   LABPROT 15.7*  --   --   --   --   INR 1.3*  --   --   --   --   HEPARINUNFRC  --   --   --  1.02* >1.10*  CREATININE 1.39*  --   --  1.30*  --   TROPONINIHS 6 6  --   --   --      CrCl cannot be calculated (Unknown ideal weight.).   Medications:  Patient takes warfarin 2.5 mg in the evening on Sun/Tues/Wed/Thurs/Fri/Sat and 5 mg on Monday  5/2 IV heparin >>   Assessment: 82 yo Pt with hx of afib on warfarin, admitted with subtherapeutic INR, CT angio chest showed PE.  Pharmacy consulted to dose heparin drip   12/08/2022 16:32 HL > 1.1 supra-therapeutic on 1000 units/hr Hgb 9.1, plts 250, Scr 1.3 HL drawn correctly and no significant bleeding per nurse (pt pulled IV out and there was some bleeding that was stopped with pressure)  Goal of Therapy:  Heparin level 0.3-0.7 units/ml Monitor platelets by anticoagulation protocol: Yes   Plan:  Hold heparin drip x 1 hour then resume heparin at a decrease rate of 800 units/hr Recheck heparin level in 8 hours Monitor daily heparin level, CBC, signs/symptoms of bleeding  Thank you for allowing pharmacy to be a part of this patient's care.  Selinda Eon, PharmD, BCPS Clinical Pharmacist  Please utilize Amion for appropriate phone number to reach the unit pharmacist [facility]  Pharmacy) 12/08/2022 5:31 PM

## 2022-12-08 NOTE — Progress Notes (Signed)
PT Cancellation Note  Patient Details Name: [name] MRN: [id number] DOB: 06/22/1941   Cancelled Treatment:      Reason Eval/Treat Not Completed: Medical issues which prohibited therapy.  Patient Initiating IV heparin this am.  PT to continue efforts once levels are therapeutic for patient to participate in assessment and increased mobility.    Marella Bile 12/08/2022, 10:27 AM Clois Dupes, PT, MPT Acute Rehabilitation Services Office: [phone] If a weekend: [facility] w/e pager [phone] 12/08/2022

## 2022-12-08 NOTE — Progress Notes (Signed)
OT Cancellation Note  Patient Details Name: [name] MRN: [id number] DOB: [birth year]   Cancelled Treatment:    Reason Eval/Treat Not Completed: Medical issues which prohibited therapy.  Patient Initiating IV heparin this am.  OT to continue efforts once therapeutic.     Syris Brookens D Dorota Heinrichs 12/08/2022, 9:38 AM 12/08/2022  RP, OTR/L  Acute Rehabilitation Services  Office:  [phone]

## 2022-12-08 NOTE — Progress Notes (Signed)
ANTICOAGULATION CONSULT NOTE - follow up  Pharmacy Consult for warfarin (changed to IV heparin) Indication: atrial fibrillation  Allergies  Allergen Reactions   Albuterol Palpitations and Other (See Comments)    Irregular heartbeat      Patient Measurements:     Vital Signs: Temp: 97.5 F (36.4 C) (05/03 0355) Temp Source: Oral (05/02 2259) BP: 104/59 (05/03 0355) Pulse Rate: 64 (05/03 0355)  Labs: Recent Labs    12/07/22 1537 12/07/22 1737 12/07/22 1955 12/08/22 0430  HGB 9.0*  --   --  9.1*  HCT 31.5*  --   --  30.9*  PLT 251  --   --  250  APTT  --   --  32  --   LABPROT 15.7*  --   --   --   INR 1.3*  --   --   --   HEPARINUNFRC  --   --   --  1.02*  CREATININE 1.39*  --   --  1.30*  TROPONINIHS 6 6  --   --      CrCl cannot be calculated (Unknown ideal weight.).   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  She has a history that includes a-fib on warfarin prior to admission.  Today's INR subtherapeutic at 1.3, hgb 9.0 (last year baseline 10.0).    Goal of Therapy:  INR 2-3 Monitor platelets by anticoagulation protocol: Yes   Plan:  Warfarin 5 mg po x 1 this evening Monitor daily INR for warfarin dose, CBC, signs/symptoms of bleeding   Thank you for allowing pharmacy to be  a part of this patient's care.  Selinda Eon, PharmD, BCPS Clinical Pharmacist [place] Please utilize Amion for appropriate phone number to reach the unit pharmacist [facility]) 12/08/2022 5:51 AM  Addendum: 5/2 CT angio Chest: PE Pharmacy consulted to dose IV heparin Goal Heparin level 0.3-0.7  Plan: Discontinue warfarin and INR Discontinue warfarin consult; please re-consult pharmacy when ready to transition back to warfarin or an alternative anticoagulant Obtain baseline aPTT Initiate IV heparin with 2500 unit bolus per infusion then 1250 units/hour continuous infusion Obtain 8 hour heparin level to titrate infusion Monitor daily heparin level, CBC, signs/symptoms of bleeding   Selinda Eon, PharmD, BCPS Clinical Pharmacist [place] Please utilize Amion for appropriate phone number to reach the unit pharmacist [facility]) 12/08/2022 5:51 AM

## 2022-12-08 NOTE — Progress Notes (Signed)
Bilateral lower extremity venous duplex has been completed. Preliminary results can be found in CV Proc through chart review.   12/08/22 11:10 AM Olen Cordial RVT

## 2022-12-08 NOTE — Progress Notes (Signed)
ANTICOAGULATION CONSULT NOTE - Follow Up Consult  Pharmacy Consult for heparin Indication: pulmonary embolus  Allergies  Allergen Reactions   Albuterol Palpitations and Other (See Comments)    Irregular heartbeat      Patient Measurements:   Heparin Dosing Weight: 97.6kg   Vital Signs: Temp: 97.5 F (36.4 C) (05/03 0355) Temp Source: Oral (05/02 2259) BP: 104/59 (05/03 0355) Pulse Rate: 64 (05/03 0355)  Labs: Recent Labs    12/07/22 1537 12/07/22 1737 12/07/22 1955 12/08/22 0430  HGB 9.0*  --   --  9.1*  HCT 31.5*  --   --  30.9*  PLT 251  --   --  250  APTT  --   --  32  --   LABPROT 15.7*  --   --   --   INR 1.3*  --   --   --   HEPARINUNFRC  --   --   --  1.02*  CREATININE 1.39*  --   --  1.30*  TROPONINIHS 6 6  --   --     CrCl cannot be calculated (Unknown ideal weight.).   Medications:  Patient takes warfarin 2.5 mg in the evening on Sun/Tues/Wed/Thurs/Fri/Sat and 5 mg on Monday   Assessment: 82 yo Pt with hx of afib on warfarin, admitted with subtherapeutic INR, CT angio chest showed PE.  Pharmacy consulted to dose heparin drip  12/08/2022 HL > 1.02 supra-therapeutic on 1250 units/hr Hgb 9.1, plts 250, Scr 1.3 No bleeding noted  Goal of Therapy:  Heparin level 0.3-0.7 units/ml Monitor platelets by anticoagulation protocol: Yes   Plan:  Hold heparin drip x 1 hour then resume heparin at a decrease rate of 1000 units/hr Check heparin level ini 8 hours Daily CBC  Arley Phenix RPh 12/08/2022, 5:59 AM

## 2022-12-08 NOTE — Progress Notes (Signed)
  Echocardiogram 2D Echocardiogram has been performed.  Adriann Ballweg Wynn Banker 12/08/2022, 4:21 PM

## 2022-12-09 DIAGNOSIS — I442 Atrioventricular block, complete: Secondary | ICD-10-CM | POA: Diagnosis not present

## 2022-12-09 DIAGNOSIS — Z7901 Long term (current) use of anticoagulants: Secondary | ICD-10-CM | POA: Diagnosis not present

## 2022-12-09 DIAGNOSIS — K219 Gastro-esophageal reflux disease without esophagitis: Secondary | ICD-10-CM | POA: Diagnosis not present

## 2022-12-09 DIAGNOSIS — Z950 Presence of cardiac pacemaker: Secondary | ICD-10-CM | POA: Diagnosis not present

## 2022-12-09 DIAGNOSIS — I10 Essential (primary) hypertension: Secondary | ICD-10-CM | POA: Diagnosis not present

## 2022-12-09 DIAGNOSIS — R0602 Shortness of breath: Secondary | ICD-10-CM | POA: Diagnosis not present

## 2022-12-09 DIAGNOSIS — I5032 Chronic diastolic (congestive) heart failure: Secondary | ICD-10-CM | POA: Diagnosis not present

## 2022-12-09 DIAGNOSIS — I2699 Other pulmonary embolism without acute cor pulmonale: Secondary | ICD-10-CM | POA: Diagnosis not present

## 2022-12-09 DIAGNOSIS — J4530 Mild persistent asthma, uncomplicated: Secondary | ICD-10-CM | POA: Diagnosis not present

## 2022-12-09 LAB — CBC
HCT: 32.1 % — ABNORMAL LOW (ref 36.0–46.0)
Hemoglobin: 9.2 g/dL — ABNORMAL LOW (ref 12.0–15.0)
MCH: 22.9 pg — ABNORMAL LOW (ref 26.0–34.0)
MCHC: 28.7 g/dL — ABNORMAL LOW (ref 30.0–36.0)
MCV: 79.9 fL — ABNORMAL LOW (ref 80.0–100.0)
Platelets: 243 10*3/uL (ref 150–400)
RBC: 4.02 MIL/uL (ref 3.87–5.11)
RDW: 21.3 % — ABNORMAL HIGH (ref 11.5–15.5)
WBC: 6 10*3/uL (ref 4.0–10.5)
nRBC: 0 % (ref 0.0–0.2)

## 2022-12-09 LAB — RENAL FUNCTION PANEL
Albumin: 2.7 g/dL — ABNORMAL LOW (ref 3.5–5.0)
Anion gap: 9 (ref 5–15)
BUN: 17 mg/dL (ref 8–23)
CO2: 20 mmol/L — ABNORMAL LOW (ref 22–32)
Calcium: 8.3 mg/dL — ABNORMAL LOW (ref 8.9–10.3)
Chloride: 108 mmol/L (ref 98–111)
Creatinine, Ser: 1.15 mg/dL — ABNORMAL HIGH (ref 0.44–1.00)
GFR, Estimated: 47 mL/min — ABNORMAL LOW (ref >60)
Glucose, Bld: 96 mg/dL (ref 70–99)
Phosphorus: 3.8 mg/dL (ref 2.5–4.6)
Potassium: 3.8 mmol/L (ref 3.5–5.1)
Sodium: 137 mmol/L (ref 135–145)

## 2022-12-09 LAB — GLUCOSE, CAPILLARY: Glucose-Capillary: 68 mg/dL — ABNORMAL LOW (ref 70–99)

## 2022-12-09 LAB — HEPARIN LEVEL (UNFRACTIONATED): Heparin Unfractionated: 0.94 IU/mL — ABNORMAL HIGH (ref 0.30–0.70)

## 2022-12-09 LAB — MAGNESIUM: Magnesium: 2 mg/dL (ref 1.7–2.4)

## 2022-12-09 MED ORDER — APIXABAN 5 MG PO TABS
10.0000 mg | ORAL_TABLET | Freq: Two times a day (BID) | ORAL | Status: DC
  Administered 2022-12-09: 10 mg via ORAL
  Filled 2022-12-09: qty 2

## 2022-12-09 MED ORDER — APIXABAN (ELIQUIS) VTE STARTER PACK (10MG AND 5MG): ORAL_TABLET | ORAL | 0 refills | Status: DC

## 2022-12-09 MED ORDER — APIXABAN 5 MG PO TABS: 5.0000 mg | ORAL_TABLET | Freq: Two times a day (BID) | ORAL | Status: DC

## 2022-12-09 MED ORDER — APIXABAN 5 MG PO TABS: 5.0000 mg | ORAL_TABLET | Freq: Two times a day (BID) | ORAL | 2 refills | Status: DC

## 2022-12-09 NOTE — Progress Notes (Signed)
ANTICOAGULATION CONSULT NOTE   Pharmacy Consult for apixaban Indication: atrial fibrillation, PE  Allergies  Allergen Reactions   Albuterol Palpitations and Other (See Comments)    Irregular heartbeat      Patient Measurements:   Total body weight: 97.6 kg  Vital Signs: Temp: 98 F (36.7 C) (05/04 0249) Temp Source: Oral (05/04 0249) BP: 102/55 (05/04 0249) Pulse Rate: 68 (05/04 0249)  Labs: Recent Labs    12/07/22 1537 12/07/22 1737 12/07/22 1955 12/08/22 0430 12/08/22 1632 12/09/22 0351  HGB 9.0*  --   --  9.1*  --  9.2*  HCT 31.5*  --   --  30.9*  --  32.1*  PLT 251  --   --  250  --  243  APTT  --   --  32  --   --   --   LABPROT 15.7*  --   --   --   --   --   INR 1.3*  --   --   --   --   --   HEPARINUNFRC  --   --   --  1.02* >1.10* 0.94*  CREATININE 1.39*  --   --  1.30*  --  1.15*  TROPONINIHS 6 6  --   --   --   --     CrCl cannot be calculated (Unknown ideal weight.).   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 Pt presented to ED on 5/2 with complaints of SOB and lightheadedness. Pt reports recent noncompliance with medications and not taking any medications for at least 2 weeks PTA. INR subtherapeutic on admission - expected with noncompliance.   Venous dopplers  negative for DVT. CTA reveals few small peripheral segmental right sided PE.   Pharmacy was consulted to dose heparin drip on admission. Now consulted to transition anticoagulation to apixaban.   Today, 12/09/22 CBC: Hgb low but stable, Plt WNL and stable SCr 1.15, total body weight 97 kg   Plan:  Stop heparin drip at the time first apixaban dose given Apixaban 10 mg PO BID x 7 days followed by 5 mg PO BID CBC with AM labs tomorrow Medication counseling and 30 day coupon provided to patient prior to discharge. Questions answered. Pt voiced understanding to stop taking warfarin.   Cindi Carbon, PharmD 12/09/22 8:32 AM

## 2022-12-09 NOTE — Discharge Summary (Signed)
Physician Discharge Summary  [name] GEX:[id number] DOB: Feb 18, 1941 DOA: 12/07/2022  PCP: Philip Aspen, Limmie Patricia, MD  Admit date: 12/07/2022 Discharge date: 12/09/2022 Admitted From: Home Disposition: Home Recommendations for Outpatient Follow-up:  Follow up with PCP and cardiology in 1 to 2 weeks Reassess BP, fluid status, CMP and CBC at follow-up Please follow up on the following pending results: None  Home Health: PT/OT Equipment/Devices: Patient has appropriate DME  Discharge Condition: Stable CODE STATUS: Full code  Follow-up Information     Philip Aspen, Limmie Patricia, MD. Schedule an appointment as soon as possible for a visit in 1 week(s).   Specialty: Internal Medicine Contact information: [address] [place] [address] [phone]                 Hospital course 82 year old F with PMH of A-fib on warfarin, AVB s/p PPM, HFpEF, HTN, asthma, GERD and obesity presenting with low blood pressure,, shortness of breath, DOE, lightheadedness, increased urination and thirst, and admitted for small subsegmental PE, hypertension and generalized weakness.  BP low at 70's/40's discharge PCP office.  Reportedly not taking his warfarin consistently for the last 2 weeks.  Patient was started on IV heparin for anticoagulation.  Started on IV fluid for hypotension.   The next day, reports improvement in his symptoms.  Continued on IV fluid.  TTE without significant finding.  A.m. cortisol and TSH within normal.  Lower extremity venous Doppler negative.  On the day of discharge, patient was transition to p.o. Eliquis.  Orthostatic vitals negative.  She is discharged on p.o. Eliquis for PE and A-fib.  Discontinued antihypertensive meds and diuretics.  Reassess BP, fluid status, CMP and CBC at follow-up.  See individual problem list below for more.   Problems addressed during this hospitalization Principal Problem:   Pulmonary emboli (HCC) Active Problems:    Mild persistent asthma   Pacemaker-St.Jude   Complete heart block (HCC)   Chronic diastolic heart failure (HCC)   Essential hypertension   GERD (gastroesophageal reflux disease)   Shortness of breath   Long term (current) use of anticoagulants   Small subsegmental pulmonary embolism: Noted on CTA chest.  Doubt this is the cause of his symptoms or presentation.  Supposedly on warfarin for atrial fibrillation but has not been taking consistently over the last 2 weeks.  INR subtherapeutic at 1.3.  TTE without significant finding.  Lower extremity venous Doppler negative. -Discharged on starter pack Eliquis     Shortness of breath/dyspnea on exertion/lightheadedness/generalized weakness: Likely due to hypotension and anemia.  BNP and serial troponin negative.  TTE without significant finding.  She has BLE edema but chronic.  TSH and a.m. cortisol within normal.  Orthostatic vitals within normal.  Symptoms and blood pressure improved. -Discontinued home antihypertensive meds and diuretics on discharge -Reassess BP, fluid status and labs at follow-up   Microcytic iron deficiency anemia: Hgb 9.0 (9.6 about a year ago).  Patient denies melena or hematochezia.  Anemia panel suggestive of severe iron deficiency.  H&H stable. -Received IV ferric gluconate 250 mg x 2 prior to discharge   Atrial fibrillation: EKG V paced rhythm at 112.  Currently with borderline bradycardia.  On warfarin for anticoagulation but INR subtherapeutic.  Has no valvular disease as far as I can tell. -Discontinued beta-blocker due to hypotension and borderline bradycardia -Changed warfarin to Eliquis   Hypotension: Likely iatrogenic in the setting of poor p.o. intake. -See dyspnea above   Chronic HFpEF: Appears euvolemic  except for BLE edema (chronic).  BNP and serial troponin negative. -See dyspnea above   Polyuria/Polydipsia: Not diabetic.  A1c 5.2%.  TSH 1.7.  Due to diuretics?  UA with large LE but rare bacteria and  negative nitrite.  Patient denies UTI symptoms.  Seems to have resolved.  CKD-3A: Renal function improved. Recent Labs    12/07/22 1537 12/08/22 0430 12/09/22 0351  BUN 23 22 17   CREATININE 1.39* 1.30* 1.15*  -Discontinued antihypertensive meds and diuretics -Recheck at follow-up    Mild persistent asthma: Stable. -Continue home inhalers.    History of AVB/CHB s/p PPM-[employer] Jude.    Essential hypertension -See hypotension and dyspnea    GERD  -Continue PPI   Gallstones seen on imaging   Generalized weakness/physical deconditioning -HH PT/OT ordered.             Time spent 45 minutes  Vital signs Vitals:   12/08/22 2018 12/08/22 2210 12/09/22 0249 12/09/22 1247  BP:  108/71 (!) 102/55 (!) 110/58  Pulse:   68 63  Temp:  97.7 F (36.5 C) 98 F (36.7 C) 97.6 F (36.4 C)  Resp:  18 17 18   SpO2: 98% (!) 89% 100% 98%  TempSrc:  Oral Oral Oral     Discharge exam  GENERAL: No apparent distress.  Nontoxic. HEENT: MMM.  Vision and hearing grossly intact.  NECK: Supple.  No apparent JVD.  RESP:  No IWOB.  Fair aeration bilaterally. CVS:  RRR. Heart sounds normal.  ABD/GI/GU: BS+. Abd soft, NTND.  MSK/EXT:  Moves extremities. No apparent deformity.  Trace BLE edema. SKIN: no apparent skin lesion or wound NEURO: Awake and alert. Oriented appropriately.  No apparent focal neuro deficit. PSYCH: Calm. Normal affect.   Discharge Instructions Discharge Instructions     Call MD for:  difficulty breathing, headache or visual disturbances   Complete by: As directed    Call MD for:  extreme fatigue   Complete by: As directed    Call MD for:  persistant dizziness or light-headedness   Complete by: As directed    Diet general   Complete by: As directed    Discharge instructions   Complete by: As directed    It has been a pleasure taking care of you!  You were hospitalized due to generalized weakness, shortness of breath and lightheadedness likely due to  pulmonary embolism (blood clot in your lungs) and low blood pressure.  You have been started on new blood thinner for blood clot and atrial fibrillation.  We have stopped your blood pressure medication due to low blood pressure.  Please review your new medication list and the directions on your medications before you take them.  Follow-up with your primary care doctor and cardiologist in 1 to 2 weeks or sooner if needed.   Take care,   Increase activity slowly   Complete by: As directed       Allergies as of 12/09/2022       Reactions   Albuterol Palpitations, Other (See Comments)   Irregular heartbeat        Medication List     STOP taking these medications    metoprolol tartrate 25 MG tablet Commonly known as: LOPRESSOR   potassium chloride 10 MEQ tablet Commonly known as: KLOR-CON M   spironolactone 25 MG tablet Commonly known as: ALDACTONE   torsemide 10 MG tablet Commonly known as: DEMADEX   valsartan 160 MG tablet Commonly known as: DIOVAN   valsartan 80 MG tablet  Commonly known as: DIOVAN   warfarin 5 MG tablet Commonly known as: COUMADIN       TAKE these medications    Apixaban Starter Pack (10mg  and 5mg ) Commonly known as: ELIQUIS STARTER PACK Take as directed on package: start with two-5mg  tablets twice daily for 7 days. On day 8, switch to one-5mg  tablet twice daily.   apixaban 5 MG Tabs tablet Commonly known as: ELIQUIS Take 1 tablet (5 mg total) by mouth 2 (two) times daily. Start this once you are done with starter pack Start taking on: January 07, 2023   atorvastatin 20 MG tablet Commonly known as: LIPITOR Take 1 tablet (20 mg total) by mouth daily. SCHEDULE APPT FOR FUTURE REFILLS   levalbuterol 45 MCG/ACT inhaler Commonly known as: Xopenex HFA Inhale 2 puffs into the lungs every 4 (four) hours as needed for wheezing.   montelukast 10 MG tablet Commonly known as: SINGULAIR TAKE 1 TABLET AT BEDTIME; SCHEDULE APPT FOR FUTURE REFILLS What  changed:  how much to take how to take this when to take this additional instructions   omeprazole 20 MG capsule Commonly known as: PRILOSEC TAKE 1 CAPSULE EVERY DAY 30 TO 60 MINUTES BEFORE FIRST MEAL OF THE DAY What changed: See the new instructions.   Symbicort 80-4.5 MCG/ACT inhaler Generic drug: budesonide-formoterol INHALE 2 PUFFS INTO THE LUNGS 2 (TWO) TIMES DAILY.        Consultations: None  Procedures/Studies:   ECHOCARDIOGRAM COMPLETE  Result Date: 12/08/2022    ECHOCARDIOGRAM REPORT   Patient Name:   [name] Date of Exam: 12/08/2022 Medical Rec #:  [id number]     Height:       65.5 in Accession #:    [id number]    Weight:       215.1 lb Date of Birth:  07-Jan-1941      BSA:          2.052 m Patient Age:    82 years      BP:           104/59 mmHg Patient Gender: F             HR:           86 bpm. Exam Location:  Inpatient Procedure: 2D Echo, Color Doppler and Cardiac Doppler Indications:    Afib, CHF,  History:        Patient has prior history of Echocardiogram examinations, most                 recent 11/30/2017. CHF, Abnormal ECG, COPD, Arrythmias:Atrial                 Fibrillation; Risk Factors:Hypertension.  Sonographer:    Lucy Antigua Referring Phys: [id number] Webster Patrone T Jonnelle Lawniczak IMPRESSIONS  1. Left ventricular ejection fraction, by estimation, is 55 to 60%. The left ventricle has normal function. The left ventricle has no regional wall motion abnormalities. Left ventricular diastolic parameters are consistent with Grade I diastolic dysfunction (impaired relaxation).  2. Right ventricular systolic function is normal. The right ventricular size is normal.  3. The mitral valve is normal in structure. Trivial mitral valve regurgitation. No evidence of mitral stenosis.  4. The aortic valve is tricuspid. Aortic valve regurgitation is not visualized. Aortic valve sclerosis/calcification is present, without any evidence of aortic stenosis.  5. The inferior vena cava is normal in size with  greater than 50% respiratory variability, suggesting right atrial pressure of 3 mmHg. FINDINGS  Left Ventricle:  Left ventricular ejection fraction, by estimation, is 55 to 60%. The left ventricle has normal function. The left ventricle has no regional wall motion abnormalities. The left ventricular internal cavity size was normal in size. There is  no left ventricular hypertrophy. Left ventricular diastolic parameters are consistent with Grade I diastolic dysfunction (impaired relaxation). Right Ventricle: The right ventricular size is normal. Right ventricular systolic function is normal. Left Atrium: Left atrial size was normal in size. Right Atrium: Right atrial size was not well visualized. Pericardium: There is no evidence of pericardial effusion. Mitral Valve: The mitral valve is normal in structure. Trivial mitral valve regurgitation. No evidence of mitral valve stenosis. Tricuspid Valve: The tricuspid valve is normal in structure. Tricuspid valve regurgitation is mild . No evidence of tricuspid stenosis. Aortic Valve: The aortic valve is tricuspid. Aortic valve regurgitation is not visualized. Aortic valve sclerosis/calcification is present, without any evidence of aortic stenosis. Aortic valve mean gradient measures 2.0 mmHg. Aortic valve peak gradient measures 3.3 mmHg. Aortic valve area, by VTI measures 4.79 cm. Pulmonic Valve: The pulmonic valve was normal in structure. Pulmonic valve regurgitation is not visualized. No evidence of pulmonic stenosis. Aorta: The aortic root is normal in size and structure. Venous: The inferior vena cava is normal in size with greater than 50% respiratory variability, suggesting right atrial pressure of 3 mmHg. IAS/Shunts: No atrial level shunt detected by color flow Doppler.  LEFT VENTRICLE PLAX 2D LVIDd:         3.00 cm     Diastology LVIDs:         2.20 cm     LV e' medial:    6.53 cm/s LV PW:         0.90 cm     LV E/e' medial:  10.0 LV IVS:        1.00 cm     LV e'  lateral:   8.27 cm/s LVOT diam:     2.00 cm     LV E/e' lateral: 7.9 LV SV:         79 LV SV Index:   39 LVOT Area:     3.14 cm  LV Volumes (MOD) LV vol d, MOD A4C: 39.0 ml LV vol s, MOD A4C: 19.4 ml LV SV MOD A4C:     39.0 ml RIGHT VENTRICLE RV S prime:     13.70 cm/s TAPSE (M-mode): 2.0 cm LEFT ATRIUM           Index        RIGHT ATRIUM           Index LA Vol (A2C): 49.7 ml 24.22 ml/m  RA Area:     18.00 cm LA Vol (A4C): 41.1 ml 20.03 ml/m  RA Volume:   46.90 ml  22.86 ml/m  AORTIC VALVE AV Area (Vmax):    3.61 cm AV Area (Vmean):   3.22 cm AV Area (VTI):     4.79 cm AV Vmax:           90.40 cm/s AV Vmean:          62.200 cm/s AV VTI:            0.166 m AV Peak Grad:      3.3 mmHg AV Mean Grad:      2.0 mmHg LVOT Vmax:         104.00 cm/s LVOT Vmean:        63.800 cm/s LVOT VTI:  0.253 m LVOT/AV VTI ratio: 1.52  AORTA Ao Root diam: 2.80 cm Ao Asc diam:  2.80 cm MITRAL VALVE               TRICUSPID VALVE MV Area (PHT): 5.27 cm    TV Peak grad:   7.5 mmHg MV Decel Time: 144 msec    TV Vmax:        1.37 m/s MV E velocity: 65.10 cm/s  TR Peak grad:   26.4 mmHg MV A velocity: 73.30 cm/s  TR Vmax:        257.00 cm/s MV E/A ratio:  0.89                            SHUNTS                            Systemic VTI:  0.25 m                            Systemic Diam: 2.00 cm Olga Millers MD Electronically signed by Olga Millers MD Signature Date/Time: 12/08/2022/4:28:18 PM    Final    VAS [place] LOWER EXTREMITY VENOUS (DVT)  Result Date: 12/08/2022  Lower Venous DVT Study Patient Name:  [facility] [name]  Date of Exam:   12/08/2022 Medical Rec #: [id number]      Accession #:    [id number] Date of Birth: April 10, 1941       Patient Gender: F Patient Age:   66 years Exam Location:  [facility] Procedure:      VAS [place] LOWER EXTREMITY VENOUS (DVT) Referring Phys: Alwyn Ren Paradise Vensel --------------------------------------------------------------------------------  Indications: Pulmonary embolism.  Risk Factors: Confirmed PE.  Anticoagulation: Heparin. Limitations: Body habitus and poor ultrasound/tissue interface. Comparison Study: No prior studies. Performing Technologist: Chanda Busing RVT  Examination Guidelines: A complete evaluation includes B-mode imaging, spectral Doppler, color Doppler, and power Doppler as needed of all accessible portions of each vessel. Bilateral testing is considered an integral part of a complete examination. Limited examinations for reoccurring indications may be performed as noted. The reflux portion of the exam is performed with the patient in reverse Trendelenburg.  +---------+---------------+---------+-----------+----------+-------------------+ RIGHT    CompressibilityPhasicitySpontaneityPropertiesThrombus Aging      +---------+---------------+---------+-----------+----------+-------------------+ CFV      Full           Yes      Yes                                      +---------+---------------+---------+-----------+----------+-------------------+ SFJ      Full                                                             +---------+---------------+---------+-----------+----------+-------------------+ FV Prox  Full                                                             +---------+---------------+---------+-----------+----------+-------------------+  FV Mid   Full                                                             +---------+---------------+---------+-----------+----------+-------------------+ FV DistalFull                                                             +---------+---------------+---------+-----------+----------+-------------------+ PFV      Full                                                             +---------+---------------+---------+-----------+----------+-------------------+ POP      Full           Yes      Yes                                       +---------+---------------+---------+-----------+----------+-------------------+ PTV      Full                                                             +---------+---------------+---------+-----------+----------+-------------------+ PERO                                                  Not well visualized +---------+---------------+---------+-----------+----------+-------------------+   +---------+---------------+---------+-----------+----------+-------------------+ LEFT     CompressibilityPhasicitySpontaneityPropertiesThrombus Aging      +---------+---------------+---------+-----------+----------+-------------------+ CFV      Full           Yes      Yes                                      +---------+---------------+---------+-----------+----------+-------------------+ SFJ      Full                                                             +---------+---------------+---------+-----------+----------+-------------------+ FV Prox  Full                                                             +---------+---------------+---------+-----------+----------+-------------------+ FV Mid   Full                                                             +---------+---------------+---------+-----------+----------+-------------------+  FV DistalFull                                                             +---------+---------------+---------+-----------+----------+-------------------+ PFV      Full                                                             +---------+---------------+---------+-----------+----------+-------------------+ POP      Full           Yes      Yes                                      +---------+---------------+---------+-----------+----------+-------------------+ PTV      Full                                                             +---------+---------------+---------+-----------+----------+-------------------+  PERO                                                  Not well visualized +---------+---------------+---------+-----------+----------+-------------------+     Summary: RIGHT: - There is no evidence of deep vein thrombosis in the lower extremity. However, portions of this examination were limited- see technologist comments above.  - No cystic structure found in the popliteal fossa.  LEFT: - There is no evidence of deep vein thrombosis in the lower extremity. However, portions of this examination were limited- see technologist comments above.  - No cystic structure found in the popliteal fossa.  *See table(s) above for measurements and observations.    Preliminary    CT Angio Chest PE W and/or Wo Contrast  Result Date: 12/07/2022 CLINICAL DATA:  Shortness of breath. EXAM: CT ANGIOGRAPHY CHEST WITH CONTRAST TECHNIQUE: Multidetector CT imaging of the chest was performed using the standard protocol during bolus administration of intravenous contrast. Multiplanar CT image reconstructions and MIPs were obtained to evaluate the vascular anatomy. RADIATION DOSE REDUCTION: This exam was performed according to the departmental dose-optimization program which includes automated exposure control, adjustment of the mA and/or kV according to patient size and/or use of iterative reconstruction technique. CONTRAST:  60mL OMNIPAQUE IOHEXOL 350 MG/ML SOLN COMPARISON:  X-ray earlier 12/07/2022 and older. Abdomen pelvis CT 11/22/2020. Chest CT without contrast 2011 FINDINGS: Cardiovascular: Few small emboli identified in the right lung including right lower lobe dependently series 10, image 250, middle lobe superolateral series 10, image 179 in middle lobe for example series 10, image 190 anteromedial. Overall only slight clot burden. Otherwise the heart is slightly enlarged. No pericardial effusion. Battery pack along the left upper chest with leads extending along the right side of the heart. The thoracic aorta has a normal  course and caliber with mild  atherosclerotic plaque. Coronary artery calcifications are seen. Slight enlargement of the right side of the heart. Mediastinum/Nodes: Normal caliber thoracic esophagus. No specific abnormal lymph node enlargement seen in the axillary region, hilum. There are some small mediastinal lymph nodes identified. These are less than 1 cm in short axis and not pathologic by size criteria. These also seen on the study of 2011. Lungs/Pleura: There is some bronchiectasis along the lung bases. Some volume loss as well in the medial left lung base. No consolidation, pneumothorax or effusion. Slight breathing motion. Upper Abdomen: The adrenal glands are preserved in the upper abdomen. Multiple stones in the gallbladder. The gallbladder is mildly distended. Please correlate for signs of gallbladder pathology. Multiple cystic lesions in the upper pole of the right kidney with rim calcifications are also stable compared to the CT scan of the abdomen pelvis from April 2022. Musculoskeletal: Diffuse degenerative changes along the spine with bridging endplate osteophytes and syndesmophytes. Review of the MIP images confirms the above findings. IMPRESSION: Few small peripheral segmental right-sided pulmonary emboli. Slight clot burden. Enlarged heart.  Pacemaker. Areas of bronchiectasis along the lung base with some volume loss. Distended gallbladder with stones. Please correlate with any symptomatology. Critical Value/emergent results were called by telephone at the time of interpretation on 12/07/2022 at 3:44 pm to provider MATTHEW TRIFAN , who verbally acknowledged these results. Aortic Atherosclerosis (ICD10-I70.0). Electronically Signed   By: Karen Kays M.D.   On: 12/07/2022 19:10   DG Chest 2 View  Result Date: 12/07/2022 CLINICAL DATA:  Shortness of breath. EXAM: CHEST - 2 VIEW COMPARISON:  11/02/2021. FINDINGS: Left chest dual-chamber pacemaker with leads projecting over the right atrium and  ventricle. No consolidation or pulmonary edema. Stable cardiac and mediastinal contours. No pleural effusion or pneumothorax. Visualized bones and upper abdomen are unremarkable. IMPRESSION: No evidence of acute cardiopulmonary disease. Electronically Signed   By: Orvan Falconer M.D.   On: 12/07/2022 16:23       The results of significant diagnostics from this hospitalization (including imaging, microbiology, ancillary and laboratory) are listed below for reference.     Microbiology: Recent Results (from the past 240 hour(s))  SARS Coronavirus 2 by RT PCR (hospital order, performed in [facility] hospital lab) *cepheid single result test* Anterior Nasal Swab     Status: None   Collection Time: 12/07/22  3:37 PM   Specimen: Anterior Nasal Swab  Result Value Ref Range Status   SARS Coronavirus 2 by RT PCR NEGATIVE NEGATIVE Final    Comment: (NOTE) SARS-CoV-2 target nucleic acids are NOT DETECTED.  The SARS-CoV-2 RNA is generally detectable in upper and lower respiratory specimens during the acute phase of infection. The lowest concentration of SARS-CoV-2 viral copies this assay can detect is 250 copies / mL. A negative result does not preclude SARS-CoV-2 infection and should not be used as the sole basis for treatment or other patient management decisions.  A negative result may occur with improper specimen collection / handling, submission of specimen other than nasopharyngeal swab, presence of viral mutation(s) within the areas targeted by this assay, and inadequate number of viral copies (<250 copies / mL). A negative result must be combined with clinical observations, patient history, and epidemiological information.  Fact Sheet for Patients:   RoadLapTop.co.za  Fact Sheet for Healthcare Providers: http://kim-miller.com/  This test is not yet approved or  cleared by the [place] FDA and has been authorized for detection and/or  diagnosis of SARS-CoV-2 by FDA under an Emergency Use  Authorization (EUA).  This EUA will remain in effect (meaning this test can be used) for the duration of the COVID-19 declaration under Section 564(b)(1) of the Act, 21 U.S.C. section 360bbb-3(b)(1), unless the authorization is terminated or revoked sooner.  Performed at [facility], [address] [place], [place] [address]      Labs:  CBC: Recent Labs  Lab 12/07/22 1537 12/08/22 0430 12/09/22 0351  WBC 7.2 6.8 6.0  NEUTROABS 4.1  --   --   HGB 9.0* 9.1* 9.2*  HCT 31.5* 30.9* 32.1*  MCV 78.2* 78.2* 79.9*  PLT 251 250 243   BMP &GFR Recent Labs  Lab 12/07/22 1537 12/08/22 0430 12/09/22 0351  NA 137 137 137  K 4.7 4.1 3.8  CL 107 109 108  CO2 22 20* 20*  GLUCOSE 98 89 96  BUN 23 22 17   CREATININE 1.39* 1.30* 1.15*  CALCIUM 8.5* 8.4* 8.3*  MG  --   --  2.0  PHOS  --   --  3.8   CrCl cannot be calculated (Unknown ideal weight.). Liver & Pancreas: Recent Labs  Lab 12/07/22 1737 12/09/22 0351  AST 24  --   ALT 15  --   ALKPHOS 117  --   BILITOT 1.0  --   PROT 6.4*  --   ALBUMIN 3.0* 2.7*   No results for input(s): "LIPASE", "AMYLASE" in the last 168 hours. No results for input(s): "AMMONIA" in the last 168 hours. Diabetic: Recent Labs    12/08/22 0430  HGBA1C 5.2   Recent Labs  Lab 12/08/22 0810 12/09/22 0842  GLUCAP 93 68*   Cardiac Enzymes: No results for input(s): "CKTOTAL", "CKMB", "CKMBINDEX", "TROPONINI" in the last 168 hours. No results for input(s): "PROBNP" in the last 8760 hours. Coagulation Profile: Recent Labs  Lab 12/05/22 0000 12/07/22 1537  INR 1.3* 1.3*   Thyroid Function Tests: Recent Labs    12/08/22 0430  TSH 1.752   Lipid Profile: No results for input(s): "CHOL", "HDL", "LDLCALC", "TRIG", "CHOLHDL", "LDLDIRECT" in the last 72 hours. Anemia Panel: Recent Labs    12/08/22 0430  FERRITIN 8*  TIBC 363  IRON 20*   Urine analysis:     Component Value Date/Time   COLORURINE YELLOW 12/08/2022 0926   APPEARANCEUR HAZY (A) 12/08/2022 0926   LABSPEC 1.024 12/08/2022 0926   PHURINE 5.0 12/08/2022 0926   GLUCOSEU NEGATIVE 12/08/2022 0926   HGBUR NEGATIVE 12/08/2022 0926   BILIRUBINUR NEGATIVE 12/08/2022 0926   KETONESUR NEGATIVE 12/08/2022 0926   PROTEINUR NEGATIVE 12/08/2022 0926   NITRITE NEGATIVE 12/08/2022 0926   LEUKOCYTESUR LARGE (A) 12/08/2022 0926   Sepsis Labs: Invalid input(s): "PROCALCITONIN", "LACTICIDVEN"   SIGNED:  Almon Hercules, MD  [facility] 12/09/2022, 2:37 PM

## 2022-12-09 NOTE — Progress Notes (Signed)
ANTICOAGULATION CONSULT NOTE - Follow Up Consult  Pharmacy Consult for heparin Indication: pulmonary embolus  Allergies  Allergen Reactions   Albuterol Palpitations and Other (See Comments)    Irregular heartbeat      Patient Measurements:   Heparin Dosing Weight: 97.6kg   Vital Signs: Temp: 98 F (36.7 C) (05/04 0249) Temp Source: Oral (05/04 0249) BP: 102/55 (05/04 0249) Pulse Rate: 68 (05/04 0249)  Labs: Recent Labs    12/07/22 1537 12/07/22 1737 12/07/22 1955 12/08/22 0430 12/08/22 1632 12/09/22 0351  HGB 9.0*  --   --  9.1*  --  9.2*  HCT 31.5*  --   --  30.9*  --  32.1*  PLT 251  --   --  250  --  243  APTT  --   --  32  --   --   --   LABPROT 15.7*  --   --   --   --   --   INR 1.3*  --   --   --   --   --   HEPARINUNFRC  --   --   --  1.02* >1.10* 0.94*  CREATININE 1.39*  --   --  1.30*  --  1.15*  TROPONINIHS 6 6  --   --   --   --      CrCl cannot be calculated (Unknown ideal weight.).   Medications:  Patient takes warfarin 2.5 mg in the evening on Sun/Tues/Wed/Thurs/Fri/Sat and 5 mg on Monday  5/2 IV heparin >>   Assessment: 82 yo Pt with hx of afib on warfarin, admitted with subtherapeutic INR, CT angio chest showed PE.  Pharmacy consulted to dose heparin drip   12/09/2022 HL 0.94 supra-therapeutic on 800 units/hr Hgb low but stable, plts WNL Per RN no bleeding   Goal of Therapy:  Heparin level 0.3-0.7 units/ml Monitor platelets by anticoagulation protocol: Yes   Plan:  Decrease heparin drip to 600 units/hr Recheck heparin level in 8 hours Monitor daily heparin level, CBC, signs/symptoms of bleeding  Thank you for allowing pharmacy to be a part of this patient's care.  Arley Phenix RPh 12/09/2022, 4:55 AM
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
## 2022-12-09 NOTE — Discharge Instructions (Signed)
Information on my medicine - ELIQUIS (apixaban)  This medication education was reviewed with me or my healthcare representative as part of my discharge preparation.    Why was Eliquis prescribed for you? Eliquis was prescribed to treat blood clots that may have been found in the veins of your legs (deep vein thrombosis) or in your lungs (pulmonary embolism) and to reduce the risk of them occurring again.  What do You need to know about Eliquis ? The starting dose is 10 mg (two 5 mg tablets) taken TWICE daily for the FIRST SEVEN (7) DAYS, then on 12/16/22  the dose is reduced to ONE 5 mg tablet taken TWICE daily.  Eliquis may be taken with or without food.   Try to take the dose about the same time in the morning and in the evening. If you have difficulty swallowing the tablet whole please discuss with your pharmacist how to take the medication safely.  Take Eliquis exactly as prescribed and DO NOT stop taking Eliquis without talking to the doctor who prescribed the medication.  Stopping may increase your risk of developing a new blood clot.  Refill your prescription before you run out.  After discharge, you should have regular check-up appointments with your healthcare provider that is prescribing your Eliquis.    What do you do if you miss a dose? If a dose of ELIQUIS is not taken at the scheduled time, take it as soon as possible on the same day and twice-daily administration should be resumed. The dose should not be doubled to make up for a missed dose.  Important Safety Information A possible side effect of Eliquis is bleeding. You should call your healthcare provider right away if you experience any of the following: Bleeding from an injury or your nose that does not stop. Unusual colored urine (red or dark brown) or unusual colored stools (red or black). Unusual bruising for unknown reasons. A serious fall or if you hit your head (even if there is no bleeding).  Some  medicines may interact with Eliquis and might increase your risk of bleeding or clotting while on Eliquis. To help avoid this, consult your healthcare provider or pharmacist prior to using any new prescription or non-prescription medications, including herbals, vitamins, non-steroidal anti-inflammatory drugs (NSAIDs) and supplements.  This website has more information on Eliquis (apixaban): http://[email or web address].com/eliquis/home

## 2022-12-09 NOTE — Evaluation (Signed)
Physical Therapy One Time Evaluation Patient Details Name: [name] MRN: [id number] DOB: 29-Sep-1940 Today's Date: 12/09/2022  History of Present Illness  Pt is an 82 year old female admitted 12/08/22 for Small subsegmental pulmonary embolism.  PMHx: HTN, HLD, obesity, Afib, emphysema, CHF, DJD, pacemaker  Clinical Impression  Patient evaluated by Physical Therapy with no further acute PT needs identified. All education has been completed and the patient has no further questions.  Pt very pleasant and agreeable to mobilize.  Pt able to ambulate 280 feet in hallway with her SPC from home.  Pt feels able to return home upon d/c and states that her nephew lives with her and can assist if needed.  PT is signing off. Thank you for this referral.        Recommendations for follow up therapy are one component of a multi-disciplinary discharge planning process, led by the attending physician.  Recommendations may be updated based on patient status, additional functional criteria and insurance authorization.  Follow Up Recommendations       Assistance Recommended at Discharge PRN  Patient can return home with the following  Help with stairs or ramp for entrance    Equipment Recommendations None recommended by PT  Recommendations for Other Services       Functional Status Assessment Patient has not had a recent decline in their functional status     Precautions / Restrictions Precautions Precautions: None Restrictions Weight Bearing Restrictions: No      Mobility  Bed Mobility Overal bed mobility: Modified Independent                  Transfers Overall transfer level: Needs assistance Equipment used: Straight cane Transfers: Sit to/from Stand Sit to Stand: Supervision                Ambulation/Gait Ambulation/Gait assistance: Min guard, Supervision Gait Distance (Feet): 280 Feet Assistive device: Straight cane Gait Pattern/deviations: Step-through pattern,  Decreased stride length Gait velocity: decr     General Gait Details: initially mildly unsteady however pt preferred to continue with SPC, steadiness improved with distance, pt with increased work of breathing with distance as well so encouraged standing rest breaks as needed, SPO2 100% on room air during ambulation  Stairs            Wheelchair Mobility    Modified Rankin (Stroke Patients Only)       Balance Overall balance assessment: No apparent balance deficits (not formally assessed)                                           Pertinent Vitals/Pain Pain Assessment Pain Assessment: No/denies pain    Home Living Family/patient expects to be discharged to:: Private residence Living Arrangements: Other relatives Available Help at Discharge: Family Type of Home: House Home Access: Stairs to enter Entrance Stairs-Rails: None Entrance Stairs-Number of Steps: 1   Home Layout: Able to live on main level with bedroom/bathroom (nephew lives on the 3rd floor) Home Equipment: Agricultural consultant (2 wheels);Cane - single point      Prior Function Prior Level of Function : Independent/Modified Independent             Mobility Comments: uses SPC ADLs Comments: She was independent with ADLs.     Hand Dominance   Dominant Hand: Right    Extremity/Trunk Assessment   Upper Extremity  Assessment Upper Extremity Assessment: Overall WFL for tasks assessed    Lower Extremity Assessment Lower Extremity Assessment: Overall WFL for tasks assessed       Communication   Communication: No difficulties  Cognition Arousal/Alertness: Awake/alert Behavior During Therapy: WFL for tasks assessed/performed Overall Cognitive Status: Within Functional Limits for tasks assessed                                          General Comments      Exercises     Assessment/Plan    PT Assessment Patient does not need any further PT services  PT  Problem List         PT Treatment Interventions      PT Goals (Current goals can be found in the Care Plan section)  Acute Rehab PT Goals PT Goal Formulation: All assessment and education complete, DC therapy    Frequency       Co-evaluation               AM-PAC PT "6 Clicks" Mobility  Outcome Measure Help needed turning from your back to your side while in a flat bed without using bedrails?: None Help needed moving from lying on your back to sitting on the side of a flat bed without using bedrails?: None Help needed moving to and from a bed to a chair (including a wheelchair)?: None Help needed standing up from a chair using your arms (e.g., wheelchair or bedside chair)?: None Help needed to walk in hospital room?: A Little Help needed climbing 3-5 steps with a railing? : A Little 6 Click Score: 22    End of Session Equipment Utilized During Treatment: Gait belt Activity Tolerance: Patient tolerated treatment well Patient left: in chair;with call bell/phone within reach;with chair alarm set Nurse Communication: Mobility status PT Visit Diagnosis: Difficulty in walking, not elsewhere classified (R26.2)    Time: 1610-9604 PT Time Calculation (min) (ACUTE ONLY): 23 min   Charges:   PT Evaluation $PT Eval Low Complexity: 1 Low        Kati PT, DPT Physical Therapist Acute Rehabilitation Services Office: [phone]   [name] 12/09/2022, 4:21 PM

## 2022-12-11 ENCOUNTER — Ambulatory Visit: Payer: Self-pay

## 2022-12-11 ENCOUNTER — Telehealth: Payer: Self-pay | Admitting: Internal Medicine

## 2022-12-11 NOTE — Telephone Encounter (Signed)
Per [name], pt's god daughter, pt has been transitioned to Eliquis during her recent hospital stay on 12/07/22.  [name] inquired if she would still need to check INR. Advised she will not need to check INR. Advised if anything is needed in the future she can always contact the coumadin clinic. Pat verbalized understanding.

## 2022-12-11 NOTE — Telephone Encounter (Signed)
Daughter states pt went to emergency room and they changed all her medicines- took her off Warfarin and put her on Eliquis/Abizxaban 5 mg tablets.  Daughter is requesting a call back.

## 2022-12-11 NOTE — Progress Notes (Signed)
Pt was transitioned to Eliquis during her recent hospitalization on 12/07/22. Resolved anticoagulation episodes.

## 2022-12-12 ENCOUNTER — Ambulatory Visit (INDEPENDENT_AMBULATORY_CARE_PROVIDER_SITE_OTHER): Payer: Medicare HMO | Admitting: Internal Medicine

## 2022-12-12 ENCOUNTER — Encounter: Payer: Self-pay | Admitting: Internal Medicine

## 2022-12-12 VITALS — BP 105/58 | HR 60 | Temp 97.5°F | Wt 219.6 lb

## 2022-12-12 DIAGNOSIS — J4530 Mild persistent asthma, uncomplicated: Secondary | ICD-10-CM | POA: Diagnosis not present

## 2022-12-12 DIAGNOSIS — I5032 Chronic diastolic (congestive) heart failure: Secondary | ICD-10-CM

## 2022-12-12 DIAGNOSIS — Z09 Encounter for follow-up examination after completed treatment for conditions other than malignant neoplasm: Secondary | ICD-10-CM

## 2022-12-12 DIAGNOSIS — I959 Hypotension, unspecified: Secondary | ICD-10-CM | POA: Diagnosis not present

## 2022-12-12 DIAGNOSIS — I2699 Other pulmonary embolism without acute cor pulmonale: Secondary | ICD-10-CM

## 2022-12-12 MED ORDER — MONTELUKAST SODIUM 10 MG PO TABS: 10.0000 mg | ORAL_TABLET | Freq: Every day | ORAL | 1 refills | Status: DC

## 2022-12-12 MED ORDER — APIXABAN 5 MG PO TABS: 5.0000 mg | ORAL_TABLET | Freq: Two times a day (BID) | ORAL | 1 refills | Status: DC

## 2022-12-12 MED ORDER — ATORVASTATIN CALCIUM 20 MG PO TABS: 20.0000 mg | ORAL_TABLET | Freq: Every day | ORAL | 1 refills | Status: DC

## 2022-12-12 NOTE — Transitions of Care (Post Inpatient/ED Visit) (Signed)
   12/12/2022  Name: [name] MRN: [id number] DOB: 15-Aug-1940  Today's TOC FU Call Status:    Transition Care Management Follow-up Telephone Call    Items Reviewed:    Medications Reviewed Today: Medications Reviewed Today     Reviewed by Trenton Gammon, CMA (Certified Medical Assistant) on 12/12/22 at 1116  Med List Status: <None>   Medication Order Taking? Sig Documenting Provider Last Dose Status Informant  apixaban (ELIQUIS) 5 MG TABS tablet [id number] Yes Take 1 tablet (5 mg total) by mouth 2 (two) times daily. Start this once you are done with starter pack Alanda Slim, Boyce Medici, MD Taking Active   APIXABAN (ELIQUIS) VTE STARTER PACK (10MG  AND 5MG ) [id number] Yes Take as directed on package: start with two-5mg  tablets twice daily for 7 days. On day 8, switch to one-5mg  tablet twice daily. Almon Hercules, MD Taking Active   atorvastatin (LIPITOR) 20 MG tablet [id number] Yes Take 1 tablet (20 mg total) by mouth daily. SCHEDULE APPT FOR FUTURE REFILLS Philip Aspen, Limmie Patricia, MD Taking Active Family Member  budesonide-formoterol [facility]) 80-4.5 MCG/ACT inhaler [id number] Yes INHALE 2 PUFFS INTO THE LUNGS 2 (TWO) TIMES DAILY.  Patient taking differently: Inhale 2 puffs into the lungs 2 (two) times daily.   Philip Aspen, Limmie Patricia, MD Taking Active Family Member  levalbuterol [facility]) 45 MCG/ACT inhaler [id number] Yes Inhale 2 puffs into the lungs every 4 (four) hours as needed for wheezing. Coral Ceo, NP Taking Active Family Member  montelukast (SINGULAIR) 10 MG tablet [id number] Yes TAKE 1 TABLET AT BEDTIME; SCHEDULE APPT FOR FUTURE REFILLS  Patient taking differently: Take 10 mg by mouth at bedtime.   Philip Aspen, Limmie Patricia, MD Taking Active Family Member  omeprazole (PRILOSEC) 20 MG capsule [id number] Yes TAKE 1 CAPSULE EVERY DAY 30 TO 60 MINUTES BEFORE FIRST MEAL OF THE DAY  Patient taking differently: Take 20 mg by mouth daily before breakfast.   Philip Aspen, Limmie Patricia, MD Taking Active Family Member            Home Care and Equipment/Supplies:    Functional Questionnaire:    Follow up appointments reviewed:      SIGNATURE: Suheily Birks cma

## 2022-12-12 NOTE — Progress Notes (Signed)
Hospital follow-up visit     CC/Reason for Visit: Hospitalization follow-up  HPI: [name] is a 82 y.o. female who is coming in today for the above mentioned reasons, specifically transitional care services face-to-face visit.    Dates hospitalized: 12/07/22-12/09/22 Days since discharge from hospital: 3 Patient was discharged from the hospital to: home  Reason for admission to hospital: hypotension and shortness of breath Date of interactive phone contact with patient and/or caregiver: 12/12/22  I have reviewed in detail patient's discharge summary plus pertinent specific notes, labs, and images from the hospitalization.  Yes  Sent from the office due to hypotension of 70/40. Found to have a small subsegmental PE that is not thought to be the cause of her HTN. She was given IVF and taken off warfarin and placed on Eliquis. Taken off all antihypertensives. Asked to follow up with cardiology, not yet scheduled. She is feeling much improved. Has iron deficiency anemia and was given ferraheme x 2 in hospital.  Medication reconciliation was done today and patient is taking meds as recommended by discharging hospitalist/specialist. 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, no surgery in kidneys    Past Surgical History:  Procedure Laterality Date   ABDOMINAL HYSTERECTOMY     BREAST BIOPSY  04/30/2012   Procedure: BREAST BIOPSY WITH NEEDLE LOCALIZATION;  Surgeon: Emelia Loron, MD;  Location: [facility] OR;  Service: General;  Laterality: Left;  Left breast wire localization biospy   HEMORRHOID SURGERY      PACEMAKER INSERTION  02-06-2011   PPM GENERATOR CHANGEOUT N/A 04/19/2020   Procedure: PPM GENERATOR CHANGEOUT;  Surgeon: Marinus Maw, MD;  Location: [facility];  Service: Cardiovascular;  Laterality: N/A;   TAH and BSO     TONSILLECTOMY      Social History:  reports that she quit smoking about 45 years ago. Her smoking use included cigarettes. She has a 4.00 pack-year smoking history. She has never used smokeless tobacco. She reports that she does not drink alcohol and does not use drugs.  Allergies: Allergies  Allergen Reactions   Albuterol Palpitations and Other (See Comments)    Irregular heartbeat      Family History:  Family History  Problem Relation Age of Onset   Heart disease Mother    Cancer Sister        breast     Current Outpatient Medications:    budesonide-formoterol (SYMBICORT) 80-4.5 MCG/ACT inhaler, INHALE 2 PUFFS INTO THE LUNGS 2 (TWO) TIMES DAILY. (Patient taking differently: Inhale 2 puffs into the lungs 2 (two) times daily.), Disp: 3 each, Rfl: 0   levalbuterol (XOPENEX HFA) 45 MCG/ACT inhaler, Inhale 2 puffs into the lungs every 4 (four) hours as needed for wheezing., Disp: 3 Inhaler, Rfl: 1   omeprazole (PRILOSEC) 20 MG capsule, TAKE 1 CAPSULE EVERY DAY 30 TO 60 MINUTES BEFORE FIRST MEAL OF THE DAY (Patient taking differently: Take 20 mg by mouth  daily before breakfast.), Disp: 90 capsule, Rfl: 0   [START ON 01/07/2023] apixaban (ELIQUIS) 5 MG TABS tablet, Take 1 tablet (5 mg total) by mouth 2 (two) times daily. Start this once you are done with starter pack, Disp: 180 tablet, Rfl: 1   atorvastatin (LIPITOR) 20 MG tablet, Take 1 tablet (20 mg total) by mouth daily. SCHEDULE APPT FOR FUTURE REFILLS, Disp: 90 tablet, Rfl: 1   montelukast (SINGULAIR) 10 MG tablet, Take 1 tablet (10 mg total) by mouth at bedtime., Disp: 90 tablet, Rfl: 1  Review of Systems:  Negative except as mentioned in HPI.    Physical Exam: Vitals:   12/12/22 1119 12/12/22  1123  BP: 90/60 (!) 105/58  Pulse: 60   Temp: (!) 97.5 F (36.4 C)   TempSrc: Oral   SpO2: 98%   Weight: 219 lb 9.6 oz (99.6 kg)     Body mass index is 35.99 kg/m.   Physical Exam Vitals reviewed.  Constitutional:      Appearance: Normal appearance.  HENT:     Head: Normocephalic and atraumatic.  Eyes:     Conjunctiva/sclera: Conjunctivae normal.     Pupils: Pupils are equal, round, and reactive to light.  Cardiovascular:     Rate and Rhythm: Normal rate and regular rhythm.  Pulmonary:     Effort: Pulmonary effort is normal.     Breath sounds: Normal breath sounds.  Skin:    General: Skin is warm and dry.  Neurological:     General: No focal deficit present.     Mental Status: She is alert and oriented to person, place, and time.  Psychiatric:        Mood and Affect: Mood normal.        Behavior: Behavior normal.        Thought Content: Thought content normal.        Judgment: Judgment normal.     Impression and Plan:  Hospital discharge follow-up  Mild persistent asthma without complication - Plan: montelukast (SINGULAIR) 10 MG tablet  Chronic diastolic heart failure (HCC) - Plan: Complete Metabolic Panel with eGFR, CBC with Differential/Platelet, atorvastatin (LIPITOR) 20 MG tablet  Hypotension, unspecified hypotension type  Acute pulmonary embolism without acute cor pulmonale, unspecified pulmonary embolism type (HCC) - Plan: apixaban (ELIQUIS) 5 MG TABS tablet  [facility]. -She is now on eliquis, no further need for INR checks. -She will make appointment with cardiology ASAP as per hospital recs. -Check CBC today. And CMP today as requested. -She is feeling well. -BP is now normal, albeit low-normal.  Medical decision making of moderate complexity was utilized today.    Chaya Jan, MD [place] [facility]

## 2022-12-19 ENCOUNTER — Ambulatory Visit: Payer: Medicare HMO

## 2023-01-22 ENCOUNTER — Ambulatory Visit (HOSPITAL_BASED_OUTPATIENT_CLINIC_OR_DEPARTMENT_OTHER): Payer: Medicare HMO | Admitting: Family

## 2023-01-23 ENCOUNTER — Ambulatory Visit: Payer: Medicare HMO | Admitting: Internal Medicine

## 2023-01-24 ENCOUNTER — Ambulatory Visit: Payer: Medicare HMO | Admitting: Internal Medicine

## 2023-01-25 ENCOUNTER — Other Ambulatory Visit: Payer: Self-pay | Admitting: Internal Medicine

## 2023-01-25 DIAGNOSIS — J4530 Mild persistent asthma, uncomplicated: Secondary | ICD-10-CM

## 2023-02-07 ENCOUNTER — Ambulatory Visit (INDEPENDENT_AMBULATORY_CARE_PROVIDER_SITE_OTHER): Payer: Medicare HMO | Admitting: Internal Medicine

## 2023-02-07 ENCOUNTER — Encounter: Payer: Self-pay | Admitting: Internal Medicine

## 2023-02-07 VITALS — BP 100/60 | HR 57 | Temp 97.5°F | Ht 65.5 in | Wt 209.0 lb

## 2023-02-07 DIAGNOSIS — B372 Candidiasis of skin and nail: Secondary | ICD-10-CM

## 2023-02-07 DIAGNOSIS — I2699 Other pulmonary embolism without acute cor pulmonale: Secondary | ICD-10-CM | POA: Diagnosis not present

## 2023-02-07 DIAGNOSIS — J4530 Mild persistent asthma, uncomplicated: Secondary | ICD-10-CM

## 2023-02-07 DIAGNOSIS — J302 Other seasonal allergic rhinitis: Secondary | ICD-10-CM

## 2023-02-07 DIAGNOSIS — I10 Essential (primary) hypertension: Secondary | ICD-10-CM | POA: Diagnosis not present

## 2023-02-07 MED ORDER — NYSTATIN 100000 UNIT/GM EX POWD
1.0000 | Freq: Three times a day (TID) | CUTANEOUS | 3 refills | Status: DC
Start: 2023-02-07 — End: 2023-04-06

## 2023-02-07 NOTE — Assessment & Plan Note (Signed)
BP well controlled off all medication.

## 2023-02-07 NOTE — Progress Notes (Signed)
Established Patient Office Visit     CC/Reason for Visit: Follow-up blood pressure and discuss acute concerns  HPI: [name] is a 82 y.o. female who is coming in today for the above mentioned reasons. Past Medical History is significant for: Hypertension, recent PE, hyperlipidemia, asthma, GERD.  During her most recent hospitalization she was taken off all antihypertensive medication due to hypotension with blood pressure of 100/60.  She has been dealing with seasonal allergies as well as a rash under her breast that she would like me to evaluate.   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, no surgery in kidneys    Past Surgical History:  Procedure Laterality Date   ABDOMINAL HYSTERECTOMY     BREAST BIOPSY  04/30/2012   Procedure: BREAST BIOPSY WITH NEEDLE LOCALIZATION;  Surgeon: Emelia Loron, MD;  Location: [facility] OR;  Service: General;  Laterality: Left;  Left breast wire localization biospy   HEMORRHOID SURGERY     PACEMAKER INSERTION  02-06-2011   PPM GENERATOR CHANGEOUT N/A 04/19/2020   Procedure: PPM GENERATOR CHANGEOUT;  Surgeon: Marinus Maw, MD;  Location: [facility];  Service: Cardiovascular;  Laterality: N/A;   TAH and BSO     TONSILLECTOMY      Social History:  reports that she quit smoking about 45 years ago. Her smoking use included cigarettes. She has a 4.00 pack-year smoking history. She has never used smokeless tobacco. She reports that she does not drink alcohol and does not use drugs.  Allergies: Allergies  Allergen Reactions   Albuterol Palpitations and Other (See  Comments)    Irregular heartbeat      Family History:  Family History  Problem Relation Age of Onset   Heart disease Mother    Cancer Sister        breast     Current Outpatient Medications:    apixaban (ELIQUIS) 5 MG TABS tablet, Take 1 tablet (5 mg total) by mouth 2 (two) times daily. Start this once you are done with starter pack, Disp: 180 tablet, Rfl: 1   atorvastatin (LIPITOR) 20 MG tablet, Take 1 tablet (20 mg total) by mouth daily. SCHEDULE APPT FOR FUTURE REFILLS, Disp: 90 tablet, Rfl: 1   budesonide-formoterol (SYMBICORT) 80-4.5 MCG/ACT inhaler, INHALE 2 PUFFS INTO THE LUNGS 2 (TWO) TIMES DAILY. (Patient taking differently: Inhale 2 puffs into the lungs 2 (two) times daily.), Disp: 3 each, Rfl: 0   levalbuterol (XOPENEX HFA) 45 MCG/ACT inhaler, Inhale 2 puffs into the lungs every 4 (four) hours as needed for wheezing., Disp: 3 Inhaler, Rfl: 1   montelukast (SINGULAIR) 10 MG tablet, Take 1 tablet (10 mg total) by mouth at bedtime., Disp: 90 tablet, Rfl: 1   nystatin (MYCOSTATIN/NYSTOP) powder, Apply 1 Application topically 3 (three) times daily., Disp: 60 g, Rfl: 3   omeprazole (PRILOSEC) 20 MG capsule, TAKE 1 CAPSULE EVERY DAY 30 TO 60 MINUTES BEFORE FIRST MEAL OF THE DAY, Disp: 90  capsule, Rfl: 1  Review of Systems:  Negative unless indicated in HPI.   Physical Exam: Vitals:   02/07/23 1108  BP: 100/60  Pulse: (!) 57  Temp: (!) 97.5 F (36.4 C)  TempSrc: Oral  SpO2: 97%  Weight: 209 lb (94.8 kg)  Height: 5' 5.5" (1.664 m)    Body mass index is 34.25 kg/m.   Physical Exam Vitals reviewed.  Constitutional:      Appearance: Normal appearance.  HENT:     Head: Normocephalic and atraumatic.  Eyes:     Conjunctiva/sclera: Conjunctivae normal.     Pupils: Pupils are equal, round, and reactive to light.  Cardiovascular:     Rate and Rhythm: Normal rate and regular rhythm.  Pulmonary:     Effort: Pulmonary effort is normal.     Breath sounds: Normal breath  sounds.  Skin:    General: Skin is warm and dry.     Comments: Apparent yeast dermatitis under breasts bilaterally but worse on left side.  Neurological:     General: No focal deficit present.     Mental Status: She is alert and oriented to person, place, and time.  Psychiatric:        Mood and Affect: Mood normal.        Behavior: Behavior normal.        Thought Content: Thought content normal.        Judgment: Judgment normal.      Impression and Plan:  Essential hypertension Assessment & Plan: BP well controlled off all medication.   Mild persistent asthma without complication  Acute pulmonary embolism without acute cor pulmonale, unspecified pulmonary embolism type (HCC)  Seasonal allergies  Yeast dermatitis -     Nystatin; Apply 1 Application topically 3 (three) times daily.  Dispense: 60 g; Refill: 3   -For her seasonal allergies have advised a daily antihistamine like Zyrtec or Claritin for 2 to 3 weeks, can continue for longer if needed. -For yeast dermatitis will prescribe nystatin powder to apply 2-3 times a day.  Time spent:32 minutes reviewing chart, interviewing and examining patient and formulating plan of care.     Chaya Jan, MD [facility]

## 2023-02-13 ENCOUNTER — Ambulatory Visit (HOSPITAL_BASED_OUTPATIENT_CLINIC_OR_DEPARTMENT_OTHER): Payer: Medicare HMO | Admitting: Family

## 2023-03-20 ENCOUNTER — Emergency Department (HOSPITAL_COMMUNITY): Payer: Medicare HMO

## 2023-03-20 ENCOUNTER — Inpatient Hospital Stay (HOSPITAL_COMMUNITY)
Admission: EM | Admit: 2023-03-20 | Discharge: 2023-04-06 | DRG: 330 | Disposition: A | Discharge status: Skilled Nursing Facility | Payer: Medicare HMO | Attending: Internal Medicine | Admitting: Internal Medicine

## 2023-03-20 ENCOUNTER — Other Ambulatory Visit: Payer: Self-pay

## 2023-03-20 DIAGNOSIS — E872 Acidosis, unspecified: Secondary | ICD-10-CM | POA: Diagnosis not present

## 2023-03-20 DIAGNOSIS — D649 Anemia, unspecified: Secondary | ICD-10-CM | POA: Diagnosis present

## 2023-03-20 DIAGNOSIS — K5669 Other partial intestinal obstruction: Secondary | ICD-10-CM | POA: Diagnosis not present

## 2023-03-20 DIAGNOSIS — J479 Bronchiectasis, uncomplicated: Secondary | ICD-10-CM | POA: Diagnosis not present

## 2023-03-20 DIAGNOSIS — D123 Benign neoplasm of transverse colon: Secondary | ICD-10-CM | POA: Diagnosis not present

## 2023-03-20 DIAGNOSIS — I2699 Other pulmonary embolism without acute cor pulmonale: Secondary | ICD-10-CM | POA: Diagnosis present

## 2023-03-20 DIAGNOSIS — D500 Iron deficiency anemia secondary to blood loss (chronic): Secondary | ICD-10-CM | POA: Diagnosis present

## 2023-03-20 DIAGNOSIS — E876 Hypokalemia: Secondary | ICD-10-CM | POA: Diagnosis not present

## 2023-03-20 DIAGNOSIS — I495 Sick sinus syndrome: Secondary | ICD-10-CM | POA: Diagnosis present

## 2023-03-20 DIAGNOSIS — I700 Atherosclerosis of aorta: Secondary | ICD-10-CM | POA: Diagnosis not present

## 2023-03-20 DIAGNOSIS — K219 Gastro-esophageal reflux disease without esophagitis: Secondary | ICD-10-CM | POA: Diagnosis present

## 2023-03-20 DIAGNOSIS — I480 Paroxysmal atrial fibrillation: Secondary | ICD-10-CM | POA: Diagnosis present

## 2023-03-20 DIAGNOSIS — R41841 Cognitive communication deficit: Secondary | ICD-10-CM | POA: Diagnosis not present

## 2023-03-20 DIAGNOSIS — Z79899 Other long term (current) drug therapy: Secondary | ICD-10-CM

## 2023-03-20 DIAGNOSIS — Z9079 Acquired absence of other genital organ(s): Secondary | ICD-10-CM

## 2023-03-20 DIAGNOSIS — Z7951 Long term (current) use of inhaled steroids: Secondary | ICD-10-CM

## 2023-03-20 DIAGNOSIS — K567 Ileus, unspecified: Secondary | ICD-10-CM | POA: Diagnosis not present

## 2023-03-20 DIAGNOSIS — I4891 Unspecified atrial fibrillation: Secondary | ICD-10-CM | POA: Diagnosis not present

## 2023-03-20 DIAGNOSIS — D490 Neoplasm of unspecified behavior of digestive system: Secondary | ICD-10-CM | POA: Diagnosis not present

## 2023-03-20 DIAGNOSIS — K640 First degree hemorrhoids: Secondary | ICD-10-CM | POA: Diagnosis present

## 2023-03-20 DIAGNOSIS — R531 Weakness: Secondary | ICD-10-CM | POA: Diagnosis not present

## 2023-03-20 DIAGNOSIS — Z86711 Personal history of pulmonary embolism: Secondary | ICD-10-CM

## 2023-03-20 DIAGNOSIS — Z713 Dietary counseling and surveillance: Secondary | ICD-10-CM

## 2023-03-20 DIAGNOSIS — I5032 Chronic diastolic (congestive) heart failure: Secondary | ICD-10-CM | POA: Diagnosis present

## 2023-03-20 DIAGNOSIS — Z1152 Encounter for screening for COVID-19: Secondary | ICD-10-CM | POA: Diagnosis not present

## 2023-03-20 DIAGNOSIS — K9189 Other postprocedural complications and disorders of digestive system: Secondary | ICD-10-CM | POA: Diagnosis not present

## 2023-03-20 DIAGNOSIS — J309 Allergic rhinitis, unspecified: Secondary | ICD-10-CM | POA: Diagnosis present

## 2023-03-20 DIAGNOSIS — R1310 Dysphagia, unspecified: Secondary | ICD-10-CM | POA: Diagnosis not present

## 2023-03-20 DIAGNOSIS — I110 Hypertensive heart disease with heart failure: Secondary | ICD-10-CM | POA: Diagnosis present

## 2023-03-20 DIAGNOSIS — Z01810 Encounter for preprocedural cardiovascular examination: Secondary | ICD-10-CM | POA: Diagnosis not present

## 2023-03-20 DIAGNOSIS — Z87891 Personal history of nicotine dependence: Secondary | ICD-10-CM

## 2023-03-20 DIAGNOSIS — E785 Hyperlipidemia, unspecified: Secondary | ICD-10-CM | POA: Diagnosis present

## 2023-03-20 DIAGNOSIS — R2689 Other abnormalities of gait and mobility: Secondary | ICD-10-CM | POA: Diagnosis not present

## 2023-03-20 DIAGNOSIS — K8020 Calculus of gallbladder without cholecystitis without obstruction: Secondary | ICD-10-CM | POA: Diagnosis present

## 2023-03-20 DIAGNOSIS — C182 Malignant neoplasm of ascending colon: Secondary | ICD-10-CM | POA: Diagnosis present

## 2023-03-20 DIAGNOSIS — C189 Malignant neoplasm of colon, unspecified: Secondary | ICD-10-CM | POA: Diagnosis not present

## 2023-03-20 DIAGNOSIS — J439 Emphysema, unspecified: Secondary | ICD-10-CM | POA: Diagnosis present

## 2023-03-20 DIAGNOSIS — K921 Melena: Secondary | ICD-10-CM | POA: Diagnosis present

## 2023-03-20 DIAGNOSIS — Z7401 Bed confinement status: Secondary | ICD-10-CM | POA: Diagnosis not present

## 2023-03-20 DIAGNOSIS — I5033 Acute on chronic diastolic (congestive) heart failure: Secondary | ICD-10-CM | POA: Diagnosis not present

## 2023-03-20 DIAGNOSIS — Z950 Presence of cardiac pacemaker: Secondary | ICD-10-CM | POA: Diagnosis not present

## 2023-03-20 DIAGNOSIS — K2950 Unspecified chronic gastritis without bleeding: Secondary | ICD-10-CM | POA: Diagnosis not present

## 2023-03-20 DIAGNOSIS — Z8249 Family history of ischemic heart disease and other diseases of the circulatory system: Secondary | ICD-10-CM

## 2023-03-20 DIAGNOSIS — K561 Intussusception: Secondary | ICD-10-CM | POA: Diagnosis present

## 2023-03-20 DIAGNOSIS — E669 Obesity, unspecified: Secondary | ICD-10-CM | POA: Diagnosis present

## 2023-03-20 DIAGNOSIS — J4489 Other specified chronic obstructive pulmonary disease: Secondary | ICD-10-CM | POA: Diagnosis present

## 2023-03-20 DIAGNOSIS — K5730 Diverticulosis of large intestine without perforation or abscess without bleeding: Secondary | ICD-10-CM | POA: Diagnosis present

## 2023-03-20 DIAGNOSIS — Z90722 Acquired absence of ovaries, bilateral: Secondary | ICD-10-CM

## 2023-03-20 DIAGNOSIS — I872 Venous insufficiency (chronic) (peripheral): Secondary | ICD-10-CM | POA: Diagnosis present

## 2023-03-20 DIAGNOSIS — Z90710 Acquired absence of both cervix and uterus: Secondary | ICD-10-CM

## 2023-03-20 DIAGNOSIS — K660 Peritoneal adhesions (postprocedural) (postinfection): Secondary | ICD-10-CM | POA: Diagnosis present

## 2023-03-20 DIAGNOSIS — Z6830 Body mass index (BMI) 30.0-30.9, adult: Secondary | ICD-10-CM

## 2023-03-20 DIAGNOSIS — D62 Acute posthemorrhagic anemia: Secondary | ICD-10-CM | POA: Diagnosis not present

## 2023-03-20 DIAGNOSIS — M6281 Muscle weakness (generalized): Secondary | ICD-10-CM | POA: Diagnosis not present

## 2023-03-20 DIAGNOSIS — Z888 Allergy status to other drugs, medicaments and biological substances status: Secondary | ICD-10-CM

## 2023-03-20 DIAGNOSIS — Z7901 Long term (current) use of anticoagulants: Secondary | ICD-10-CM

## 2023-03-20 DIAGNOSIS — R0602 Shortness of breath: Principal | ICD-10-CM

## 2023-03-20 DIAGNOSIS — Z8611 Personal history of tuberculosis: Secondary | ICD-10-CM

## 2023-03-20 DIAGNOSIS — R197 Diarrhea, unspecified: Secondary | ICD-10-CM | POA: Diagnosis not present

## 2023-03-20 DIAGNOSIS — K56691 Other complete intestinal obstruction: Secondary | ICD-10-CM | POA: Diagnosis present

## 2023-03-20 DIAGNOSIS — J449 Chronic obstructive pulmonary disease, unspecified: Secondary | ICD-10-CM | POA: Diagnosis not present

## 2023-03-20 DIAGNOSIS — R509 Fever, unspecified: Secondary | ICD-10-CM | POA: Diagnosis not present

## 2023-03-20 DIAGNOSIS — K429 Umbilical hernia without obstruction or gangrene: Secondary | ICD-10-CM | POA: Diagnosis present

## 2023-03-20 DIAGNOSIS — K317 Polyp of stomach and duodenum: Secondary | ICD-10-CM | POA: Diagnosis present

## 2023-03-20 DIAGNOSIS — C180 Malignant neoplasm of cecum: Secondary | ICD-10-CM | POA: Diagnosis not present

## 2023-03-20 NOTE — ED Provider Notes (Signed)
  [place] EMERGENCY DEPARTMENT AT [facility] Provider Note   CSN: [id number] Arrival date & time: 03/20/23  2053     History {Add pertinent medical, surgical, social history, OB history to HPI:1} Chief Complaint  Patient presents with   Shortness of Breath    [name] is a 82 y.o. female.  HPI     Home Medications Prior to Admission medications   Medication Sig Start Date End Date Taking? Authorizing Provider  apixaban (ELIQUIS) 5 MG TABS tablet Take 1 tablet (5 mg total) by mouth 2 (two) times daily. Start this once you are done with starter pack 01/07/23   Philip Aspen, Limmie Patricia, MD  atorvastatin (LIPITOR) 20 MG tablet Take 1 tablet (20 mg total) by mouth daily. SCHEDULE APPT FOR FUTURE REFILLS 12/12/22   Philip Aspen, Limmie Patricia, MD  budesonide-formoterol (SYMBICORT) 80-4.5 MCG/ACT inhaler INHALE 2 PUFFS INTO THE LUNGS 2 (TWO) TIMES DAILY. Patient taking differently: Inhale 2 puffs into the lungs 2 (two) times daily. 11/13/22   Philip Aspen, Limmie Patricia, MD  levalbuterol [facility] HFA) 45 MCG/ACT inhaler Inhale 2 puffs into the lungs every 4 (four) hours as needed for wheezing. 05/31/18   Coral Ceo, NP  montelukast (SINGULAIR) 10 MG tablet Take 1 tablet (10 mg total) by mouth at bedtime. 12/12/22   Philip Aspen, Limmie Patricia, MD  nystatin (MYCOSTATIN/NYSTOP) powder Apply 1 Application topically 3 (three) times daily. 02/07/23   Philip Aspen, Limmie Patricia, MD  omeprazole (PRILOSEC) 20 MG capsule TAKE 1 CAPSULE EVERY DAY 30 TO 60 MINUTES BEFORE FIRST MEAL OF THE DAY 01/25/23   Philip Aspen, Limmie Patricia, MD      Allergies    Albuterol    Review of Systems   Review of Systems  Physical Exam Updated Vital Signs BP (!) 118/53 (BP Location: Right Arm)   Pulse 60   Temp 97.9 F (36.6 C) (Oral)   Resp 18   Ht 5\' 6"  (1.676 m)   Wt 85.7 kg   SpO2 99%   BMI 30.51 kg/m  Physical Exam  ED Results / Procedures / Treatments   Labs (all labs ordered are  listed, but only abnormal results are displayed) Labs Reviewed  SARS CORONAVIRUS 2 BY RT PCR  BASIC METABOLIC PANEL  CBC WITH DIFFERENTIAL/PLATELET  TROPONIN I (HIGH SENSITIVITY)    EKG None  Radiology No results found.  Procedures Procedures  {Document cardiac monitor, telemetry assessment procedure when appropriate:1}  Medications Ordered in ED Medications - No data to display  ED Course/ Medical Decision Making/ A&P   {   Click here for ABCD2, HEART and other calculatorsREFRESH Note before signing :1}                              Medical Decision Making  ***  {Document critical care time when appropriate:1} {Document review of labs and clinical decision tools ie heart score, Chads2Vasc2 etc:1}  {Document your independent review of radiology images, and any outside records:1} {Document your discussion with family members, caretakers, and with consultants:1} {Document social determinants of health affecting pt's care:1} {Document your decision making why or why not admission, treatments were needed:1} Final Clinical Impression(s) / ED Diagnoses Final diagnoses:  None    Rx / DC Orders ED Discharge Orders     None
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
## 2023-03-21 ENCOUNTER — Observation Stay (HOSPITAL_COMMUNITY): Payer: Medicare HMO

## 2023-03-21 ENCOUNTER — Encounter (HOSPITAL_COMMUNITY): Payer: Self-pay | Admitting: Family Medicine

## 2023-03-21 DIAGNOSIS — K921 Melena: Secondary | ICD-10-CM | POA: Diagnosis not present

## 2023-03-21 DIAGNOSIS — K8020 Calculus of gallbladder without cholecystitis without obstruction: Secondary | ICD-10-CM | POA: Diagnosis not present

## 2023-03-21 DIAGNOSIS — D649 Anemia, unspecified: Secondary | ICD-10-CM

## 2023-03-21 DIAGNOSIS — D62 Acute posthemorrhagic anemia: Secondary | ICD-10-CM

## 2023-03-21 DIAGNOSIS — I4891 Unspecified atrial fibrillation: Secondary | ICD-10-CM | POA: Diagnosis not present

## 2023-03-21 DIAGNOSIS — K561 Intussusception: Secondary | ICD-10-CM | POA: Diagnosis not present

## 2023-03-21 DIAGNOSIS — K5730 Diverticulosis of large intestine without perforation or abscess without bleeding: Secondary | ICD-10-CM | POA: Diagnosis not present

## 2023-03-21 LAB — CBC WITH DIFFERENTIAL/PLATELET
Abs Immature Granulocytes: 0.01 10*3/uL (ref 0.00–0.07)
Basophils Absolute: 0 10*3/uL (ref 0.0–0.1)
Basophils Relative: 1 %
Eosinophils Absolute: 0 10*3/uL (ref 0.0–0.5)
Eosinophils Relative: 1 %
HCT: 24.6 % — ABNORMAL LOW (ref 36.0–46.0)
Hemoglobin: 7.1 g/dL — ABNORMAL LOW (ref 12.0–15.0)
Immature Granulocytes: 0 %
Lymphocytes Relative: 27 %
Lymphs Abs: 1 10*3/uL (ref 0.7–4.0)
MCH: 19.7 pg — ABNORMAL LOW (ref 26.0–34.0)
MCHC: 28.9 g/dL — ABNORMAL LOW (ref 30.0–36.0)
MCV: 68.1 fL — ABNORMAL LOW (ref 80.0–100.0)
Monocytes Absolute: 0.5 10*3/uL (ref 0.1–1.0)
Monocytes Relative: 13 %
Neutro Abs: 2.2 10*3/uL (ref 1.7–7.7)
Neutrophils Relative %: 58 %
Platelets: 229 10*3/uL (ref 150–400)
RBC: 3.61 MIL/uL — ABNORMAL LOW (ref 3.87–5.11)
RDW: 24.3 % — ABNORMAL HIGH (ref 11.5–15.5)
WBC: 3.9 10*3/uL — ABNORMAL LOW (ref 4.0–10.5)
nRBC: 0 % (ref 0.0–0.2)

## 2023-03-21 LAB — BASIC METABOLIC PANEL
Anion gap: 10 (ref 5–15)
BUN: 10 mg/dL (ref 8–23)
CO2: 19 mmol/L — ABNORMAL LOW (ref 22–32)
Calcium: 8.1 mg/dL — ABNORMAL LOW (ref 8.9–10.3)
Chloride: 108 mmol/L (ref 98–111)
Creatinine, Ser: 0.76 mg/dL (ref 0.44–1.00)
GFR, Estimated: >60 mL/min (ref >60)
Glucose, Bld: 103 mg/dL — ABNORMAL HIGH (ref 70–99)
Potassium: 3 mmol/L — ABNORMAL LOW (ref 3.5–5.1)
Sodium: 137 mmol/L (ref 135–145)

## 2023-03-21 LAB — CBC
HCT: 36.5 % (ref 36.0–46.0)
Hemoglobin: 10.8 g/dL — ABNORMAL LOW (ref 12.0–15.0)
MCH: 21.7 pg — ABNORMAL LOW (ref 26.0–34.0)
MCHC: 29.6 g/dL — ABNORMAL LOW (ref 30.0–36.0)
MCV: 73.3 fL — ABNORMAL LOW (ref 80.0–100.0)
Platelets: 218 10*3/uL (ref 150–400)
RBC: 4.98 MIL/uL (ref 3.87–5.11)
RDW: 26 % — ABNORMAL HIGH (ref 11.5–15.5)
WBC: 6.6 10*3/uL (ref 4.0–10.5)
nRBC: 0 % (ref 0.0–0.2)

## 2023-03-21 LAB — IRON AND TIBC
Iron: 19 ug/dL — ABNORMAL LOW (ref 28–170)
Saturation Ratios: 6 % — ABNORMAL LOW (ref 10.4–31.8)
TIBC: 314 ug/dL (ref 250–450)
UIBC: 295 ug/dL

## 2023-03-21 LAB — FOLATE: Folate: 10.2 ng/mL (ref >5.9)

## 2023-03-21 LAB — SARS CORONAVIRUS 2 BY RT PCR: SARS Coronavirus 2 by RT PCR: NEGATIVE

## 2023-03-21 LAB — ABO/RH: ABO/RH(D): O POS

## 2023-03-21 LAB — POC OCCULT BLOOD, ED: Fecal Occult Bld: POSITIVE — AB

## 2023-03-21 LAB — PREPARE RBC (CROSSMATCH)

## 2023-03-21 LAB — VITAMIN B12: Vitamin B-12: 906 pg/mL (ref 180–914)

## 2023-03-21 LAB — MAGNESIUM: Magnesium: 1.7 mg/dL (ref 1.7–2.4)

## 2023-03-21 LAB — FERRITIN: Ferritin: 7 ng/mL — ABNORMAL LOW (ref 11–307)

## 2023-03-21 MED ORDER — IOHEXOL 9 MG/ML PO SOLN
500.0000 mL | ORAL | Status: AC
  Administered 2023-03-21 (×2): 500 mL via ORAL

## 2023-03-21 MED ORDER — MOMETASONE FURO-FORMOTEROL FUM 100-5 MCG/ACT IN AERO
2.0000 | INHALATION_SPRAY | Freq: Two times a day (BID) | RESPIRATORY_TRACT | Status: DC
  Administered 2023-03-21 – 2023-04-06 (×32): 2 via RESPIRATORY_TRACT
  Filled 2023-03-21: qty 8.8

## 2023-03-21 MED ORDER — POTASSIUM CHLORIDE CRYS ER 20 MEQ PO TBCR
40.0000 meq | EXTENDED_RELEASE_TABLET | Freq: Once | ORAL | Status: AC
  Administered 2023-03-21: 40 meq via ORAL
  Filled 2023-03-21: qty 2

## 2023-03-21 MED ORDER — SODIUM CHLORIDE 0.9 % IV SOLN
250.0000 mg | Freq: Once | INTRAVENOUS | Status: AC
  Administered 2023-03-21: 250 mg via INTRAVENOUS
  Filled 2023-03-21: qty 20

## 2023-03-21 MED ORDER — SODIUM CHLORIDE 0.9% IV SOLUTION: Freq: Once | INTRAVENOUS | Status: AC

## 2023-03-21 MED ORDER — LEVALBUTEROL TARTRATE 45 MCG/ACT IN AERO: 2.0000 | INHALATION_SPRAY | RESPIRATORY_TRACT | Status: DC | PRN

## 2023-03-21 MED ORDER — PANTOPRAZOLE SODIUM 40 MG IV SOLR
40.0000 mg | Freq: Two times a day (BID) | INTRAVENOUS | Status: DC
  Administered 2023-03-21 – 2023-04-05 (×29): 40 mg via INTRAVENOUS
  Filled 2023-03-21 (×29): qty 10

## 2023-03-21 MED ORDER — IPRATROPIUM BROMIDE 0.02 % IN SOLN: 0.5000 mg | Freq: Four times a day (QID) | RESPIRATORY_TRACT | Status: DC | PRN

## 2023-03-21 MED ORDER — ONDANSETRON HCL 4 MG PO TABS: 4.0000 mg | ORAL_TABLET | Freq: Four times a day (QID) | ORAL | Status: DC | PRN

## 2023-03-21 MED ORDER — ATORVASTATIN CALCIUM 20 MG PO TABS
20.0000 mg | ORAL_TABLET | Freq: Every day | ORAL | Status: DC
  Administered 2023-03-21 – 2023-04-06 (×15): 20 mg via ORAL
  Filled 2023-03-21 (×15): qty 1

## 2023-03-21 MED ORDER — BISACODYL 5 MG PO TBEC: 5.0000 mg | DELAYED_RELEASE_TABLET | Freq: Every day | ORAL | Status: DC | PRN

## 2023-03-21 MED ORDER — TRAZODONE HCL 50 MG PO TABS
25.0000 mg | ORAL_TABLET | Freq: Every evening | ORAL | Status: DC | PRN
  Administered 2023-03-21 – 2023-04-04 (×2): 25 mg via ORAL
  Filled 2023-03-21 (×3): qty 1

## 2023-03-21 MED ORDER — POTASSIUM CHLORIDE CRYS ER 20 MEQ PO TBCR
20.0000 meq | EXTENDED_RELEASE_TABLET | Freq: Two times a day (BID) | ORAL | Status: AC
  Administered 2023-03-21 (×2): 20 meq via ORAL
  Filled 2023-03-21 (×2): qty 1

## 2023-03-21 MED ORDER — HYDROCODONE-ACETAMINOPHEN 5-325 MG PO TABS: 1.0000 | ORAL_TABLET | ORAL | Status: DC | PRN

## 2023-03-21 MED ORDER — MORPHINE SULFATE (PF) 2 MG/ML IV SOLN: 1.0000 mg | Freq: Four times a day (QID) | INTRAVENOUS | Status: DC | PRN

## 2023-03-21 MED ORDER — IOHEXOL 9 MG/ML PO SOLN
ORAL | Status: AC
  Filled 2023-03-21: qty 1000

## 2023-03-21 MED ORDER — ACETAMINOPHEN 325 MG PO TABS: 650.0000 mg | ORAL_TABLET | Freq: Four times a day (QID) | ORAL | Status: DC | PRN

## 2023-03-21 MED ORDER — ACETAMINOPHEN 650 MG RE SUPP: 650.0000 mg | Freq: Four times a day (QID) | RECTAL | Status: DC | PRN

## 2023-03-21 MED ORDER — PANTOPRAZOLE SODIUM 40 MG PO TBEC
40.0000 mg | DELAYED_RELEASE_TABLET | Freq: Every day | ORAL | Status: DC
  Administered 2023-03-21: 40 mg via ORAL
  Filled 2023-03-21: qty 1

## 2023-03-21 MED ORDER — IOHEXOL 300 MG/ML  SOLN
100.0000 mL | Freq: Once | INTRAMUSCULAR | Status: AC | PRN
  Administered 2023-03-21: 100 mL via INTRAVENOUS

## 2023-03-21 MED ORDER — SODIUM CHLORIDE 0.9 % IV SOLN: INTRAVENOUS | Status: DC

## 2023-03-21 MED ORDER — SODIUM CHLORIDE 0.9% FLUSH
3.0000 mL | Freq: Two times a day (BID) | INTRAVENOUS | Status: DC
  Administered 2023-03-21 – 2023-04-06 (×30): 3 mL via INTRAVENOUS

## 2023-03-21 MED ORDER — HYDRALAZINE HCL 20 MG/ML IJ SOLN: 5.0000 mg | Freq: Three times a day (TID) | INTRAMUSCULAR | Status: DC | PRN

## 2023-03-21 MED ORDER — ONDANSETRON HCL 4 MG/2ML IJ SOLN
4.0000 mg | Freq: Four times a day (QID) | INTRAMUSCULAR | Status: DC | PRN
  Administered 2023-04-02: 4 mg via INTRAVENOUS
  Filled 2023-03-21: qty 2

## 2023-03-21 MED ORDER — MONTELUKAST SODIUM 10 MG PO TABS
10.0000 mg | ORAL_TABLET | Freq: Every day | ORAL | Status: DC
  Administered 2023-03-21 – 2023-04-05 (×16): 10 mg via ORAL
  Filled 2023-03-21 (×16): qty 1

## 2023-03-21 MED ORDER — SENNOSIDES-DOCUSATE SODIUM 8.6-50 MG PO TABS: 1.0000 | ORAL_TABLET | Freq: Every evening | ORAL | Status: DC | PRN

## 2023-03-21 NOTE — Progress Notes (Signed)
No charge progress note.  [name] is a 82 y.o. female with a known history of, arthritis, asthma, CHF, COPD, GERD, hypertension, paroxysmal atrial fibrillation, hyperlipidemia, recent PE in May 2024, paroxysmal atrial fibrillation s/p pacemaker on Eliquis presents to the emergency department for evaluation of shortness of breath, dyspnea on exertion, wheezing.  She does report dark sticky stools but no bright red blood, no vaginal bleeding.  She was found to have hemoglobin of 7.1, fecal occult blood positive.  COVID-negative.  8/15: Vital stable, received 2 unit of PRBC, GI was consulted.  Message sent to [facility]. Patient had a normal bowel movement today.  Last Eliquis dose was on Monday night. She was recently admitted in May 2024 and found to have a small subsegmental PE, she was on Coumadin before and was missing multiple doses and INR was subtherapeutic, she was started on Eliquis at that time. Anemia panel consistent with iron deficiency-also ordered 1 dose of IV iron.  Hemoglobin improved to 10.8 after getting 2 unit of PRBC.  Patient was having benign exam.  Patient likely will need EGD and colonoscopy. Discussed with got daughter at bedside.

## 2023-03-21 NOTE — H&P (Signed)
History and Physical   [facility]   Admission History and Physical [employer], D.O.    Patient Name: [name] MR#: [id number] Date of Birth: Apr 17, 1941 Date of Admission: 03/20/2023  Referring MD/NP/PA: Dr. Preston Fleeting Primary Care Physician: Philip Aspen, Limmie Patricia, MD  Chief Complaint:  Chief Complaint  Patient presents with   Shortness of Breath    HPI: [name] is a 82 y.o. female with a known history of, arthritis, asthma, CHF, COPD, GERD, hypertension, hyperlipidemia, pacemaker on Eliquis presents to the emergency department for evaluation of shortness of breath, dyspnea on exertion, wheezing.  Patient was in a usual state of health until 1 week ago she reports the onset of progressively worsening shortness of breath and dyspnea on exertion associated with cough productive of clear him.  She does report dark sticky stools but no bright red blood, no vaginal bleeding  Patient denies fevers/chills,  dizziness, chest pain, shortness of breath, N/V/C/D, abdominal pain, dysuria/frequency, changes in mental status.    Otherwise there has been no change in status. Patient has been taking medication as prescribed and there has been no recent change in medication or diet.  No recent antibiotics.  There has been no recent illness, hospitalizations, travel or sick contacts.    EMS/ED Course: Patient received normal saline, potassium. Medical admission has been requested for further management of symptomatic microcytic anemia.  Review of Systems:  CONSTITUTIONAL: Positive fatigue.  No fever/chills,weakness, weight gain/loss, headache. EYES: No blurry or double vision. ENT: No tinnitus, postnasal drip, redness or soreness of the oropharynx. RESPIRATORY: Positive shortness of breath and dyspnea on exertion no cough, wheeze.  No hemoptysis.  CARDIOVASCULAR: No chest pain, palpitations, syncope, orthopnea. No lower extremity edema.  GASTROINTESTINAL: No  nausea, vomiting, abdominal pain, diarrhea, constipation.  No hematemesis, melena or hematochezia. GENITOURINARY: No dysuria, frequency, hematuria. ENDOCRINE: No polyuria or nocturia. No heat or cold intolerance. HEMATOLOGY: No anemia, bruising, bleeding. INTEGUMENTARY: No rashes, ulcers, lesions. MUSCULOSKELETAL: No arthritis, gout. NEUROLOGIC: No numbness, tingling, ataxia, seizure-type activity, weakness. PSYCHIATRIC: No anxiety, depression, insomnia.   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   rxd 3 yrs meds, no surgery in kidneys    Past Surgical History:  Procedure Laterality Date   ABDOMINAL HYSTERECTOMY     BREAST BIOPSY  04/30/2012   Procedure: BREAST BIOPSY WITH NEEDLE LOCALIZATION;  Surgeon: Emelia Loron, MD;  Location: [facility] OR;  Service: General;  Laterality: Left;  Left breast wire localization biospy   HEMORRHOID SURGERY     PACEMAKER INSERTION  02-06-2011   PPM GENERATOR CHANGEOUT N/A 04/19/2020   Procedure: PPM GENERATOR CHANGEOUT;  Surgeon: Marinus Maw, MD;  Location: [facility] INVASIVE CV LAB;  Service: Cardiovascular;  Laterality: N/A;   TAH and BSO     TONSILLECTOMY       reports that she quit smoking about 45 years ago. Her smoking use included cigarettes. She started smoking about 49 years ago. She has a 4 pack-year smoking history. She has never  used smokeless tobacco. She reports that she does not drink alcohol and does not use drugs.  Allergies  Allergen Reactions   Albuterol Palpitations and Other (See Comments)    Irregular heartbeat      Family History  Problem Relation Age of Onset   Heart disease Mother    Cancer Sister         breast    Prior to Admission medications   Medication Sig Start Date End Date Taking? Authorizing Provider  apixaban (ELIQUIS) 5 MG TABS tablet Take 1 tablet (5 mg total) by mouth 2 (two) times daily. Start this once you are done with starter pack 01/07/23   Philip Aspen, Limmie Patricia, MD  atorvastatin (LIPITOR) 20 MG tablet Take 1 tablet (20 mg total) by mouth daily. SCHEDULE APPT FOR FUTURE REFILLS 12/12/22   Philip Aspen, Limmie Patricia, MD  budesonide-formoterol (SYMBICORT) 80-4.5 MCG/ACT inhaler INHALE 2 PUFFS INTO THE LUNGS 2 (TWO) TIMES DAILY. Patient taking differently: Inhale 2 puffs into the lungs 2 (two) times daily. 11/13/22   Philip Aspen, Limmie Patricia, MD  levalbuterol [facility] HFA) 45 MCG/ACT inhaler Inhale 2 puffs into the lungs every 4 (four) hours as needed for wheezing. 05/31/18   Coral Ceo, NP  montelukast (SINGULAIR) 10 MG tablet Take 1 tablet (10 mg total) by mouth at bedtime. 12/12/22   Philip Aspen, Limmie Patricia, MD  nystatin (MYCOSTATIN/NYSTOP) powder Apply 1 Application topically 3 (three) times daily. 02/07/23   Philip Aspen, Limmie Patricia, MD  omeprazole (PRILOSEC) 20 MG capsule TAKE 1 CAPSULE EVERY DAY 30 TO 60 MINUTES BEFORE FIRST MEAL OF THE DAY 01/25/23   Philip Aspen, Limmie Patricia, MD    Physical Exam: Vitals:   03/20/23 2108 03/20/23 2139 03/20/23 2142 03/21/23 0030  BP: (!) 118/53   120/76  Pulse: 60   65  Resp: 18   13  Temp: 97.9 F (36.6 C)     TempSrc: Oral     SpO2: 99% 99%  100%  Weight:   85.7 kg   Height:   5\' 6"  (1.676 m)     GENERAL: 82 y.o.-year-old female patient, well-developed, well-nourished lying in the bed in no acute distress.  Pleasant and cooperative.   HEENT: Head atraumatic, normocephalic. Pupils equal. Mucus membranes moist. NECK: Supple. No JVD. CHEST: Normal breath sounds bilaterally. No wheezing, rales, rhonchi or crackles. No use of accessory muscles of respiration.  No reproducible chest wall tenderness.  CARDIOVASCULAR: S1,  S2 normal. No murmurs, rubs, or gallops. Cap refill <2 seconds. Pulses intact distally.  ABDOMEN: Soft, nondistended, nontender. No rebound, guarding, rigidity. Normoactive bowel sounds present in all four quadrants.  EXTREMITIES: No pedal edema, cyanosis, or clubbing. No calf tenderness or Homan's sign.  NEUROLOGIC: The patient is alert and oriented x 3. Cranial nerves II through XII are grossly intact with no focal sensorimotor deficit. PSYCHIATRIC:  Normal affect, mood, thought content. SKIN: Warm, dry, and intact without obvious rash, lesion, or ulcer.    Labs on Admission:  CBC: Recent Labs  Lab 03/20/23 2316  WBC 3.9*  NEUTROABS 2.2  HGB 7.1*  HCT 24.6*  MCV 68.1*  PLT 229   Basic Metabolic Panel: Recent Labs  Lab 03/20/23 2316  NA 139  K 2.9*  CL 111  CO2 22  GLUCOSE 87  BUN 10  CREATININE 0.90  CALCIUM 7.8*   GFR: Estimated Creatinine Clearance: 51.3 mL/min (by C-G formula based on SCr of 0.9 mg/dL). Liver Function Tests: No results for input(s): "  AST", "ALT", "ALKPHOS", "BILITOT", "PROT", "ALBUMIN" in the last 168 hours. No results for input(s): "LIPASE", "AMYLASE" in the last 168 hours. No results for input(s): "AMMONIA" in the last 168 hours. Coagulation Profile: No results for input(s): "INR", "PROTIME" in the last 168 hours. Cardiac Enzymes: No results for input(s): "CKTOTAL", "CKMB", "CKMBINDEX", "TROPONINI" in the last 168 hours. BNP (last 3 results) No results for input(s): "PROBNP" in the last 8760 hours. HbA1C: No results for input(s): "HGBA1C" in the last 72 hours. CBG: No results for input(s): "GLUCAP" in the last 168 hours. Lipid Profile: No results for input(s): "CHOL", "HDL", "LDLCALC", "TRIG", "CHOLHDL", "LDLDIRECT" in the last 72 hours. Thyroid Function Tests: No results for input(s): "TSH", "T4TOTAL", "FREET4", "T3FREE", "THYROIDAB" in the last 72 hours. Anemia Panel: No results for input(s): "VITAMINB12", "FOLATE", "FERRITIN", "TIBC",  "IRON", "RETICCTPCT" in the last 72 hours. Urine analysis:    Component Value Date/Time   COLORURINE YELLOW 12/08/2022 0926   APPEARANCEUR HAZY (A) 12/08/2022 0926   LABSPEC 1.024 12/08/2022 0926   PHURINE 5.0 12/08/2022 0926   GLUCOSEU NEGATIVE 12/08/2022 0926   HGBUR NEGATIVE 12/08/2022 0926   BILIRUBINUR NEGATIVE 12/08/2022 0926   KETONESUR NEGATIVE 12/08/2022 0926   PROTEINUR NEGATIVE 12/08/2022 0926   NITRITE NEGATIVE 12/08/2022 0926   LEUKOCYTESUR LARGE (A) 12/08/2022 0926   Sepsis Labs: @LABRCNTIP (procalcitonin:4,lacticidven:4) ) Recent Results (from the past 240 hour(s))  SARS Coronavirus 2 by RT PCR (hospital order, performed in [facility] hospital lab) *cepheid single result test* Anterior Nasal Swab     Status: None   Collection Time: 03/20/23 11:31 PM   Specimen: Anterior Nasal Swab  Result Value Ref Range Status   SARS Coronavirus 2 by RT PCR NEGATIVE NEGATIVE Final    Comment: (NOTE) SARS-CoV-2 target nucleic acids are NOT DETECTED.  The SARS-CoV-2 RNA is generally detectable in upper and lower respiratory specimens during the acute phase of infection. The lowest concentration of SARS-CoV-2 viral copies this assay can detect is 250 copies / mL. A negative result does not preclude SARS-CoV-2 infection and should not be used as the sole basis for treatment or other patient management decisions.  A negative result may occur with improper specimen collection / handling, submission of specimen other than nasopharyngeal swab, presence of viral mutation(s) within the areas targeted by this assay, and inadequate number of viral copies (<250 copies / mL). A negative result must be combined with clinical observations, patient history, and epidemiological information.  Fact Sheet for Patients:   RoadLapTop.co.za  Fact Sheet for Healthcare Providers: http://kim-miller.com/  This test is not yet approved or  cleared by the  [place] FDA and has been authorized for detection and/or diagnosis of SARS-CoV-2 by FDA under an Emergency Use Authorization (EUA).  This EUA will remain in effect (meaning this test can be used) for the duration of the COVID-19 declaration under Section 564(b)(1) of the Act, 21 U.S.C. section 360bbb-3(b)(1), unless the authorization is terminated or revoked sooner.  Performed at [facility], [address], [place], [place] [address]      Radiological Exams on Admission: DG Chest 2 View  Result Date: 03/20/2023 CLINICAL DATA:  Shortness of breath EXAM: CHEST - 2 VIEW COMPARISON:  12/07/2022 FINDINGS: Lungs are clear.  No pleural effusion or pneumothorax. The heart is normal in size.  Left subclavian pacemaker. Degenerative changes of the visualized thoracolumbar spine. IMPRESSION: Normal chest radiographs. Electronically Signed   By: Charline Bills M.D.   On: 03/20/2023 22:48  EKG: Normal sinus rhythm at 60 bpm with normal axis, left bundle branch block and nonspecific ST-T wave changes.   Assessment/Plan  This is a 82 y.o. female with a history of arthritis, asthma, CHF, COPD, GERD, hypertension, hyperlipidemia, pacemaker  now being admitted with:  #.  Symptomatic anemia, microcytic - Admit observation - Transfuse 2 units packed red blood cells - Check iron, ferritin, TIBC, B12, folate, stool guaiac - Hold Eliquis pending stool guaiac.  Resume if negative.  If positive will need GI consult  #.  Moderate hypokalemia - Replace orally - Check magnesium level  #.  History of hyperlipidemia - Continue Lipitor  #. History of GERD - Continue Prilosec  #. History of COPD - Continue budesonide, levalbuterol, Singulair   Admission status: Observation, telemetry IV Fluids: NS Diet/Nutrition: Heart healthy Consults called: None DVT Px: SCDs and early ambulation. Code Status: Full Code  Disposition Plan: To home in less than 24 hours  All the  records are reviewed and case discussed with ED provider. Management plans discussed with the patient and/or family who express understanding and agree with plan of care.    D.O. on 03/21/2023 at 12:58 AM CC: Primary care physician; Philip Aspen, Limmie Patricia, MD   03/21/2023, 12:58 AM

## 2023-03-21 NOTE — ED Notes (Signed)
POC occult: POSITIVE

## 2023-03-21 NOTE — Hospital Course (Addendum)
82 y.o. female with a known history of COPD, GERD, hypertension, hyperlipidemia and recent history of pulmonary embolism 12/2022, paroxysmal A-fib status post pacemaker presented to the ER with shortness of breath, dyspnea on exertion and wheezing along with reports of dark sticky stools.   In the ED: anemia w/ hemoglobin 7.1,admitted with acute GI bleeding, received 2 units of PRBC.  Patient was on Coumadin for paroxysmal A-fib with irregular dosing, she has been on Eliquis since the diagnosis. 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 Underwent EGD colonoscopy and found to have nearly obstructive colonic mass hemoglobin improving with 2 unit PRBC.  Surgery has been consulted-plan for exploratory laparotomy/extended right hemicolectomy on 8/19.  Cardiology consult for preop evaluation 8/17-BNP slightly elevated but no JVD, with chronic leg edema likely from venous insufficiency, okay to proceed for surgery per cardiology

## 2023-03-21 NOTE — Consult Note (Addendum)
Referring Provider: Dr. Arnetha Courser  Primary Care Physician:  Philip Aspen, Limmie Patricia, MD Primary Gastroenterologist:  Jacqulyn Ducking PCP  Reason for Consultation:  Anemia, black stools   HPI: [name] is a 82 y.o. female with a past medical history of arthritis, asthma, COPD, hypertension, hyperlipidemia, atrial fibrillation (no longer on Coumadin), paroxysmal atrial tachycardia s/p pacemaker placement 2012, chronic diastolic CHF (LV EF 55 - 60 % per ECHO 12/2022), small subsegmental PE 12/09/2022 on Eliquis, obesity anemia and GERD.   Patient presented to the ED 03/20/2023 via EMS secondary to progressive shortness of breath, fatigue and headache for the past week.  She also noted passing dark sticky stools for the past week. Labs in the ED showed a WBC count of 3.9.  Hemoglobin 7.1 down from 9.2 three months ago.  Hematocrit 24.6.  Platelet 229.  BUN 10.  Creatinine 0.90.  Potassium 2.9.  Troponin 13.  Iron 19.  Saturation ratio 6.  Ferritin 7.  FOBT positive. SARS coronavirus 2 negative. She was transfused 2 units of PRBCs, posttransfusion H&H pending. Iron Negative chest x-ray.  A GI consult was requested for further evaluation regarding suspected upper GI bleed with symptomatic anemia.  Her last dose of Eliquis was taken yesterday morning. No NSAID use. She denies having any nausea or vomiting. She endorsed having intermittent central lower abdominal pain for few days over the past week which abated. No upper abdominal pain. No heartburn. Food sometimes passes slowly down the esophagus but does not get stuck. She takes omeprazole 20 mg daily for a prior history of GERD. She noticed her stools were black in color for the past week but also noted she is seeing black stools on and off in the past as well. Last black stool was 2 days ago.  She possibly underwent an upper endoscopy many years ago, further details are unclear. She passed a bowel movement a few minutes ago which she reported  was brown. She infrequently sees a small amount of bright red blood on the toilet tissue.  She believes she possibly underwent a colonoscopy 20+ years ago, further details are unknown.  No alcohol use.  Non-smoker.  She has been living with her granddaughter due to a recent house several months ago.  Her goddaughter is present.   Patient was admitted to the hospital 12/07/2022 due to having shortness of breath, lightheadedness and hypotension.  CTA during identified a small subsegmental PE.  Lower extremity venous doppler study was negative.  Patient reported being off Coumadin for 2 weeks prior to this admission.  She was on IV Heparin then transitioned to Eliquis at time of discharge 12/09/2022.                                                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   rxd 3 yrs meds, no surgery in  kidneys    Past Surgical History:  Procedure Laterality Date   ABDOMINAL HYSTERECTOMY     BREAST BIOPSY  04/30/2012   Procedure: BREAST BIOPSY WITH NEEDLE LOCALIZATION;  Surgeon: Emelia Loron, MD;  Location: [facility] OR;  Service: General;  Laterality: Left;  Left breast wire localization biospy   HEMORRHOID SURGERY     PACEMAKER INSERTION  02-06-2011   PPM GENERATOR CHANGEOUT N/A 04/19/2020   Procedure: PPM GENERATOR CHANGEOUT;  Surgeon: Marinus Maw, MD;  Location: [facility] INVASIVE CV LAB;  Service: Cardiovascular;  Laterality: N/A;   TAH and BSO     TONSILLECTOMY      Prior to Admission medications   Medication Sig Start Date End Date Taking? Authorizing Provider  apixaban (ELIQUIS) 5 MG TABS tablet Take 1 tablet (5 mg total) by mouth 2 (two) times daily. Start  this once you are done with starter pack Patient taking differently: Take 5 mg by mouth 2 (two) times daily. 01/07/23  Yes Philip Aspen, Limmie Patricia, MD  atorvastatin (LIPITOR) 20 MG tablet Take 1 tablet (20 mg total) by mouth daily. SCHEDULE APPT FOR FUTURE REFILLS Patient taking differently: Take 20 mg by mouth daily. 12/12/22  Yes Philip Aspen, Limmie Patricia, MD  budesonide-formoterol (SYMBICORT) 80-4.5 MCG/ACT inhaler INHALE 2 PUFFS INTO THE LUNGS 2 (TWO) TIMES DAILY. Patient taking differently: Inhale 2 puffs into the lungs 2 (two) times daily. 11/13/22  Yes Philip Aspen, Limmie Patricia, MD  levalbuterol [facility] HFA) 45 MCG/ACT inhaler Inhale 2 puffs into the lungs every 4 (four) hours as needed for wheezing. 05/31/18  Yes Coral Ceo, NP  montelukast (SINGULAIR) 10 MG tablet Take 1 tablet (10 mg total) by mouth at bedtime. 12/12/22  Yes Philip Aspen, Limmie Patricia, MD  omeprazole (PRILOSEC) 20 MG capsule TAKE 1 CAPSULE EVERY DAY 30 TO 60 MINUTES BEFORE FIRST MEAL OF THE DAY Patient taking differently: Take 20 mg by mouth daily. 01/25/23  Yes Philip Aspen, Limmie Patricia, MD  nystatin (MYCOSTATIN/NYSTOP) powder Apply 1 Application topically 3 (three) times daily. Patient not taking: Reported on 03/21/2023 02/07/23   Philip Aspen, Limmie Patricia, MD    Current Facility-Administered Medications  Medication Dose Route Frequency Provider Last Rate Last Admin   0.9 %  sodium chloride infusion   Intravenous Continuous Hugelmeyer, Alexis, DO       acetaminophen (TYLENOL) tablet 650 mg  650 mg Oral Q6H PRN Hugelmeyer, Alexis, DO       Or   acetaminophen (TYLENOL) suppository 650 mg  650 mg Rectal Q6H PRN Hugelmeyer, Alexis, DO       atorvastatin (LIPITOR) tablet 20 mg  20 mg Oral Daily Hugelmeyer, Alexis, DO   20 mg at 03/21/23 1100   bisacodyl (DULCOLAX) EC tablet 5 mg  5 mg Oral Daily PRN Hugelmeyer, Alexis, DO       hydrALAZINE (APRESOLINE) injection 5 mg  5 mg Intravenous Q8H PRN Hugelmeyer, Alexis, DO        HYDROcodone-acetaminophen (NORCO/VICODIN) 5-325 MG per tablet 1-2 tablet  1-2 tablet Oral Q4H PRN Hugelmeyer, Alexis, DO       ipratropium (ATROVENT) nebulizer solution 0.5 mg  0.5 mg Nebulization Q6H PRN Hugelmeyer, Alexis, DO       levalbuterol (XOPENEX HFA) inhaler 2 puff  2 puff Inhalation Q4H PRN Hugelmeyer, Alexis, DO       mometasone-formoterol (DULERA) 100-5 MCG/ACT inhaler 2 puff  2 puff Inhalation BID Hugelmeyer, Alexis, DO       montelukast (SINGULAIR) tablet 10 mg  10 mg Oral QHS Hugelmeyer, Alexis, DO       morphine (PF) 2 MG/ML injection 1 mg  1 mg Intravenous Q6H PRN Hugelmeyer, Alexis, DO       ondansetron (ZOFRAN) tablet 4 mg  4 mg Oral Q6H PRN Hugelmeyer, Alexis, DO       Or   ondansetron (ZOFRAN) injection 4 mg  4 mg Intravenous Q6H PRN Hugelmeyer, Alexis, DO       pantoprazole (PROTONIX) EC tablet 40 mg  40 mg Oral Daily Hugelmeyer, Alexis, DO   40 mg at 03/21/23 1043   potassium chloride SA (KLOR-CON M) CR tablet 20 mEq  20 mEq Oral BID Hugelmeyer, Alexis, DO   20 mEq at 03/21/23 1043   senna-docusate (Senokot-S) tablet 1 tablet  1 tablet Oral QHS PRN Hugelmeyer, Alexis, DO       sodium chloride flush (NS) 0.9 % injection 3 mL  3 mL Intravenous Q12H Hugelmeyer, Alexis, DO       traZODone (DESYREL) tablet 25 mg  25 mg Oral QHS PRN Hugelmeyer, Alexis, DO        Allergies as of 03/20/2023 - Review Complete 03/20/2023  Allergen Reaction Noted   Albuterol Palpitations and Other (See Comments)     Family History  Problem Relation Age of Onset   Heart disease Mother    Cancer Sister        breast    Social History   Socioeconomic History   Marital status: Divorced    Spouse name: Not on file   Number of children: 2   Years of education: Not on file   Highest education level: Not on file  Occupational History   Occupation: cna  Tobacco Use   Smoking status: Former    Current packs/day: 0.00    Average packs/day: 1 pack/day for 4.0 years (4.0 ttl pk-yrs)     Types: Cigarettes    Start date: 08/07/1973    Quit date: 08/07/1977    Years since quitting: 45.6   Smokeless tobacco: Never  Vaping Use   Vaping status: Never Used  Substance and Sexual Activity   Alcohol use: No    Alcohol/week: 0.0 standard drinks of alcohol    Comment: quit 1979   Drug use: No   Sexual activity: Not on file  Other Topics Concern   Not on file  Social History Narrative   Pt has 11 siblings in all   Social Determinants of Health   Financial Resource Strain: Low Risk  (01/19/2022)   Overall Financial Resource Strain (CARDIA)    Difficulty of Paying Living Expenses: Not hard at all  Food Insecurity: No Food Insecurity (01/19/2022)   Hunger Vital Sign    Worried About Running Out of Food in the Last Year: Never true    Ran Out of Food in the Last Year: Never true  Transportation Needs: No Transportation Needs (01/19/2022)   PRAPARE - Administrator, Civil Service (Medical): No    Lack of Transportation (Non-Medical): No  Physical Activity: Not on file  Stress: No Stress Concern Present (01/19/2022)   [employer] of Occupational Health - Occupational Stress Questionnaire    Feeling of Stress : Not at all  Social Connections: Not on file  Intimate Partner Violence: Not on file    Review of Systems: Gen: Denies fever, sweats or chills. No weight loss.  CV: + Leg swelling. No CP.  Resp: + SOB. No hemoptysis.  GI: See HPI. GU : Denies  urinary burning, blood in urine, increased urinary frequency or incontinence. MS: Denies joint pain, muscles aches or weakness. Derm: Denies rash, itchiness, skin lesions or unhealing ulcers. Psych: Denies depression, anxiety, memory loss or confusion. Heme: Denies easy bruising, bleeding. Neuro: + Headaches.  Endo:  Denies any problems with DM, thyroid or adrenal function.  Physical Exam: Vital signs in last 24 hours: Temp:  [97.6 F (36.4 C)-98.7 F (37.1 C)] 98 F (36.7 C) (08/14 0834) Pulse Rate:   [36-70] 59 (08/14 0834) Resp:  [11-24] 20 (08/14 0834) BP: (111-152)/(53-83) 152/83 (08/14 0834) SpO2:  [93 %-100 %] 100 % (08/14 0834) Weight:  [85.7 kg] 85.7 kg (08/13 2142) Last BM Date : 03/21/23 General: Alert 82 year old female in no acute distress. Head:  Normocephalic and atraumatic. Eyes:  No scleral icterus. Conjunctiva pink. Ears:  Normal auditory acuity. Nose:  No deformity, discharge or lesions. Mouth: Upper dentures.  No ulcers or lesions.  Neck:  Supple. No lymphadenopathy or thyromegaly.  Lungs: Breath sounds clear throughout. No wheezes, rhonchi or crackles.  Chest: Pacemaker generator high upper left chest.  Heart: Regular rate and rhythm, no murmurs. Abdomen: Soft, nondistended. Mild tenderness to the central lower abdomen without rebound or guarding. Positive bowel sounds to all 4 quadrants. Rectal: Deferred. Musculoskeletal:  Symmetrical without gross deformities.  Pulses:  Normal pulses noted. Extremities: Bilateral lower extremities with 1 to 2+ edema, RLE > LLE. Neurologic:  Alert and  oriented x 4. No focal deficits.  Skin:  Intact without significant lesions or rashes. Psych:  Alert and cooperative. Normal mood and affect.  Intake/Output from previous day: 08/13 0701 - 08/14 0700 In: 462.1 [I.V.:60; Blood:402.1] Out: -  Intake/Output this shift: Total I/O In: 500 [Blood:500] Out: -   Lab Results: Recent Labs    03/20/23 2316  WBC 3.9*  HGB 7.1*  HCT 24.6*  PLT 229   BMET Recent Labs    03/20/23 2316  NA 139  K 2.9*  CL 111  CO2 22  GLUCOSE 87  BUN 10  CREATININE 0.90  CALCIUM 7.8*   LFT No results for input(s): "PROT", "ALBUMIN", "AST", "ALT", "ALKPHOS", "BILITOT", "BILIDIR", "IBILI" in the last 72 hours. PT/INR No results for input(s): "LABPROT", "INR" in the last 72 hours. Hepatitis Panel No results for input(s): "HEPBSAG", "HCVAB", "HEPAIGM", "HEPBIGM" in the last 72 hours.    Studies/Results: DG Chest 2 View  Result  Date: 03/20/2023 CLINICAL DATA:  Shortness of breath EXAM: CHEST - 2 VIEW COMPARISON:  12/07/2022 FINDINGS: Lungs are clear.  No pleural effusion or pneumothorax. The heart is normal in size.  Left subclavian pacemaker. Degenerative changes of the visualized thoracolumbar spine. IMPRESSION: Normal chest radiographs. Electronically Signed   By: Charline Bills M.D.   On: 03/20/2023 22:48    IMPRESSION/PLAN:  82 year old female with symptomatic acute on chronic anemia and melenic stools.  FOBT +. Admission hemoglobin 7.1 ( Hg 9.07 Dec 2022).  BUN 10.  Transfused 2 units of PRBC -> post transfusion Hg 10.8.  Iron 19.  Saturation ratio 6%.  Ferritin 7.  Endoscopic history is unknown.  -CBC, CMP and INR  in am -Transfuse for Hg < 8 -Recommend IV iron during this hospital admission -Pantoprazole 40 mg IV twice daily -NPO after midnight  -EGD benefits and risks discussed including risk with sedation, risk of bleeding, perforation and infection, timing to be determined  -To consider colonoscopy during this hospitalization if EGD unrevealing   Hypokalemia. K+ 2.9 -> 3.0. -Management  per the  hospitalist   Central lower abdominal pain x 1 week. -CTAP with oral and IV contrast today  History of GERD -See plan above  History of atrial fibrillation (previously on Coumadin then switched to Eliquis during hospitalization 12/2022 when found to have a small subsegmental PE. Last dose of Eliquis was in the AM on 03/20/2023. -Continue to hold Eliquis  Paroxysmal atrial tachycardia s/p pacemaker placement   Chronic diastolic CHF (LV EF 55 - 60 % per ECHO 12/2022   History of COPD   Arnaldo Natal  03/21/2023, 2:46 PM    Attending physician's note   I have taken history, reviewed the chart and examined the patient. I performed a substantive portion of this encounter, including complete performance of at least one of the key components, in conjunction with the APP. I agree with the Advanced  Practitioner's note, impression and recommendations.   Melena with Hb 9 (May 2024) to 7 s/p 2U  Symptomatic acute on chronic IDA. Unclear if she had any endo procedures in past A fib/PE on Eliquis (last dose 8/13)  Plan: -Trend CBC, CMP -IV protonix  -CT AP with contrast -EGD in AM. -Suspect she will need colon too. However, anesthesia has availability for only 1 procedure tomorrow (also may be).   Pl keep her NPO after MN for EGD    Edman Circle, MD [facility] [phone]
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
## 2023-03-21 NOTE — ED Provider Notes (Signed)
Case is discussed with Dr. Emmit Pomfret of [facility], who agrees to admit the patient.   Dione Booze, MD 03/21/23 0040

## 2023-03-22 ENCOUNTER — Ambulatory Visit: Payer: Self-pay

## 2023-03-22 ENCOUNTER — Encounter (HOSPITAL_COMMUNITY)
Admission: EM | Disposition: A | Discharge status: Skilled Nursing Facility | Payer: Self-pay | Source: Home / Self Care | Attending: Internal Medicine

## 2023-03-22 DIAGNOSIS — K567 Ileus, unspecified: Secondary | ICD-10-CM | POA: Diagnosis not present

## 2023-03-22 DIAGNOSIS — K561 Intussusception: Secondary | ICD-10-CM | POA: Diagnosis present

## 2023-03-22 DIAGNOSIS — Z1152 Encounter for screening for COVID-19: Secondary | ICD-10-CM | POA: Diagnosis not present

## 2023-03-22 DIAGNOSIS — J439 Emphysema, unspecified: Secondary | ICD-10-CM | POA: Diagnosis present

## 2023-03-22 DIAGNOSIS — I2699 Other pulmonary embolism without acute cor pulmonale: Secondary | ICD-10-CM

## 2023-03-22 DIAGNOSIS — Z01810 Encounter for preprocedural cardiovascular examination: Secondary | ICD-10-CM | POA: Diagnosis not present

## 2023-03-22 DIAGNOSIS — I5032 Chronic diastolic (congestive) heart failure: Secondary | ICD-10-CM | POA: Diagnosis present

## 2023-03-22 DIAGNOSIS — E876 Hypokalemia: Secondary | ICD-10-CM | POA: Diagnosis not present

## 2023-03-22 DIAGNOSIS — D490 Neoplasm of unspecified behavior of digestive system: Secondary | ICD-10-CM | POA: Diagnosis not present

## 2023-03-22 DIAGNOSIS — I5033 Acute on chronic diastolic (congestive) heart failure: Secondary | ICD-10-CM | POA: Diagnosis not present

## 2023-03-22 DIAGNOSIS — K921 Melena: Secondary | ICD-10-CM | POA: Diagnosis present

## 2023-03-22 DIAGNOSIS — M6281 Muscle weakness (generalized): Secondary | ICD-10-CM | POA: Diagnosis not present

## 2023-03-22 DIAGNOSIS — E669 Obesity, unspecified: Secondary | ICD-10-CM | POA: Diagnosis present

## 2023-03-22 DIAGNOSIS — K429 Umbilical hernia without obstruction or gangrene: Secondary | ICD-10-CM | POA: Diagnosis present

## 2023-03-22 DIAGNOSIS — J479 Bronchiectasis, uncomplicated: Secondary | ICD-10-CM | POA: Diagnosis not present

## 2023-03-22 DIAGNOSIS — K219 Gastro-esophageal reflux disease without esophagitis: Secondary | ICD-10-CM

## 2023-03-22 DIAGNOSIS — E872 Acidosis, unspecified: Secondary | ICD-10-CM | POA: Diagnosis not present

## 2023-03-22 DIAGNOSIS — C189 Malignant neoplasm of colon, unspecified: Secondary | ICD-10-CM | POA: Diagnosis not present

## 2023-03-22 DIAGNOSIS — K8020 Calculus of gallbladder without cholecystitis without obstruction: Secondary | ICD-10-CM | POA: Diagnosis present

## 2023-03-22 DIAGNOSIS — K5669 Other partial intestinal obstruction: Secondary | ICD-10-CM | POA: Diagnosis not present

## 2023-03-22 DIAGNOSIS — C182 Malignant neoplasm of ascending colon: Secondary | ICD-10-CM | POA: Diagnosis present

## 2023-03-22 DIAGNOSIS — J4489 Other specified chronic obstructive pulmonary disease: Secondary | ICD-10-CM | POA: Diagnosis present

## 2023-03-22 DIAGNOSIS — K5730 Diverticulosis of large intestine without perforation or abscess without bleeding: Secondary | ICD-10-CM | POA: Diagnosis present

## 2023-03-22 DIAGNOSIS — J309 Allergic rhinitis, unspecified: Secondary | ICD-10-CM | POA: Diagnosis present

## 2023-03-22 DIAGNOSIS — R0602 Shortness of breath: Secondary | ICD-10-CM | POA: Diagnosis present

## 2023-03-22 DIAGNOSIS — E785 Hyperlipidemia, unspecified: Secondary | ICD-10-CM | POA: Diagnosis present

## 2023-03-22 DIAGNOSIS — I480 Paroxysmal atrial fibrillation: Secondary | ICD-10-CM | POA: Diagnosis present

## 2023-03-22 DIAGNOSIS — J449 Chronic obstructive pulmonary disease, unspecified: Secondary | ICD-10-CM | POA: Diagnosis not present

## 2023-03-22 DIAGNOSIS — D649 Anemia, unspecified: Secondary | ICD-10-CM | POA: Diagnosis not present

## 2023-03-22 DIAGNOSIS — K9189 Other postprocedural complications and disorders of digestive system: Secondary | ICD-10-CM | POA: Diagnosis not present

## 2023-03-22 DIAGNOSIS — R2689 Other abnormalities of gait and mobility: Secondary | ICD-10-CM | POA: Diagnosis not present

## 2023-03-22 DIAGNOSIS — I495 Sick sinus syndrome: Secondary | ICD-10-CM | POA: Diagnosis present

## 2023-03-22 DIAGNOSIS — K56691 Other complete intestinal obstruction: Secondary | ICD-10-CM | POA: Diagnosis present

## 2023-03-22 DIAGNOSIS — K640 First degree hemorrhoids: Secondary | ICD-10-CM | POA: Diagnosis present

## 2023-03-22 DIAGNOSIS — I110 Hypertensive heart disease with heart failure: Secondary | ICD-10-CM | POA: Diagnosis present

## 2023-03-22 DIAGNOSIS — Z87891 Personal history of nicotine dependence: Secondary | ICD-10-CM | POA: Diagnosis not present

## 2023-03-22 DIAGNOSIS — I700 Atherosclerosis of aorta: Secondary | ICD-10-CM | POA: Diagnosis not present

## 2023-03-22 DIAGNOSIS — K317 Polyp of stomach and duodenum: Secondary | ICD-10-CM | POA: Diagnosis present

## 2023-03-22 DIAGNOSIS — D500 Iron deficiency anemia secondary to blood loss (chronic): Secondary | ICD-10-CM | POA: Diagnosis present

## 2023-03-22 LAB — BPAM RBC
Blood Product Expiration Date: 202409132359
Blood Product Expiration Date: 202409132359
ISSUE DATE / TIME: 202408140250
ISSUE DATE / TIME: 202408140454
Unit Type and Rh: 5100
Unit Type and Rh: 5100

## 2023-03-22 LAB — CBC
HCT: 31.7 % — ABNORMAL LOW (ref 36.0–46.0)
Hemoglobin: 9.5 g/dL — ABNORMAL LOW (ref 12.0–15.0)
MCH: 21.7 pg — ABNORMAL LOW (ref 26.0–34.0)
MCHC: 30 g/dL (ref 30.0–36.0)
MCV: 72.5 fL — ABNORMAL LOW (ref 80.0–100.0)
Platelets: 200 10*3/uL (ref 150–400)
RBC: 4.37 MIL/uL (ref 3.87–5.11)
RDW: 25.9 % — ABNORMAL HIGH (ref 11.5–15.5)
WBC: 5.4 10*3/uL (ref 4.0–10.5)
nRBC: 0 % (ref 0.0–0.2)

## 2023-03-22 LAB — COMPREHENSIVE METABOLIC PANEL
ALT: 14 U/L (ref 0–44)
AST: 24 U/L (ref 15–41)
Albumin: 2.2 g/dL — ABNORMAL LOW (ref 3.5–5.0)
Alkaline Phosphatase: 80 U/L (ref 38–126)
Anion gap: 6 (ref 5–15)
BUN: 7 mg/dL — ABNORMAL LOW (ref 8–23)
CO2: 19 mmol/L — ABNORMAL LOW (ref 22–32)
Calcium: 7.1 mg/dL — ABNORMAL LOW (ref 8.9–10.3)
Chloride: 113 mmol/L — ABNORMAL HIGH (ref 98–111)
Creatinine, Ser: 0.73 mg/dL (ref 0.44–1.00)
GFR, Estimated: >60 mL/min (ref >60)
Glucose, Bld: 89 mg/dL (ref 70–99)
Potassium: 3.3 mmol/L — ABNORMAL LOW (ref 3.5–5.1)
Sodium: 138 mmol/L (ref 135–145)
Total Bilirubin: 1.4 mg/dL — ABNORMAL HIGH (ref 0.3–1.2)
Total Protein: 4.5 g/dL — ABNORMAL LOW (ref 6.5–8.1)

## 2023-03-22 LAB — TYPE AND SCREEN
ABO/RH(D): O POS
Antibody Screen: NEGATIVE
Unit division: 0
Unit division: 0

## 2023-03-22 LAB — MAGNESIUM: Magnesium: 1.6 mg/dL — ABNORMAL LOW (ref 1.7–2.4)

## 2023-03-22 LAB — PROTIME-INR
INR: 1.8 — ABNORMAL HIGH (ref 0.8–1.2)
Prothrombin Time: 20.7 s — ABNORMAL HIGH (ref 11.4–15.2)

## 2023-03-22 SURGERY — ESOPHAGOGASTRODUODENOSCOPY (EGD) WITH PROPOFOL
Anesthesia: Monitor Anesthesia Care

## 2023-03-22 MED ORDER — PEG-KCL-NACL-NASULF-NA ASC-C 100 G PO SOLR: 1.0000 | Freq: Once | ORAL | Status: DC

## 2023-03-22 MED ORDER — PEG-KCL-NACL-NASULF-NA ASC-C 100 G PO SOLR
0.5000 | Freq: Once | ORAL | Status: AC
  Administered 2023-03-22: 100 g via ORAL
  Filled 2023-03-22: qty 1

## 2023-03-22 MED ORDER — PEG-KCL-NACL-NASULF-NA ASC-C 100 G PO SOLR
0.5000 | Freq: Once | ORAL | Status: AC
  Administered 2023-03-22: 100 g via ORAL

## 2023-03-22 MED ORDER — MAGNESIUM SULFATE 2 GM/50ML IV SOLN
2.0000 g | Freq: Once | INTRAVENOUS | Status: AC
  Administered 2023-03-22: 2 g via INTRAVENOUS
  Filled 2023-03-22: qty 50

## 2023-03-22 MED ORDER — POTASSIUM CHLORIDE CRYS ER 20 MEQ PO TBCR
20.0000 meq | EXTENDED_RELEASE_TABLET | Freq: Two times a day (BID) | ORAL | Status: AC
  Administered 2023-03-22 (×2): 20 meq via ORAL
  Filled 2023-03-22 (×2): qty 1

## 2023-03-22 MED ORDER — MAGNESIUM SULFATE 4 GM/100ML IV SOLN
4.0000 g | Freq: Once | INTRAVENOUS | Status: AC
  Administered 2023-03-22: 4 g via INTRAVENOUS
  Filled 2023-03-22: qty 100

## 2023-03-22 NOTE — Assessment & Plan Note (Signed)
Patient is currently on IV Protonix

## 2023-03-22 NOTE — Assessment & Plan Note (Signed)
Also found to have hypomagnesemia at 1.6. -Replete potassium and magnesium and monitor

## 2023-03-22 NOTE — Chronic Care Management (AMB) (Signed)
03/22/2023  [name] 09/15/1940 [id number]   Reason for Encounter: Patient is not currently enrolled in the CCM program. CCM enrollment status changed to Previously enrolled.   [facility] [phone]

## 2023-03-22 NOTE — Assessment & Plan Note (Signed)
Hemoglobin improved to 9.5 s/p 2 unit of PRBC.  No more melena. CT abdomen done by GI shows an ascending colon mass which can be malignant and responsible for this GI blood loss. -EGD and colonoscopy tomorrow -Continue holding Eliquis

## 2023-03-22 NOTE — Progress Notes (Signed)
Mobility Specialist - Progress Note  During mobility:  70 bpm HR, 94% SpO2   03/22/23 0909  Mobility  Activity Ambulated with assistance in hallway;Ambulated independently to bathroom  Level of Assistance Standby assist, set-up cues, supervision of patient - no hands on  Assistive Device Other (Comment) (IV Pole)  Distance Ambulated (ft) 350 ft  Range of Motion/Exercises Active  Activity Response Tolerated well  Mobility Referral Yes  $Mobility charge 1 Mobility  Mobility Specialist Start Time (ACUTE ONLY) 0845  Mobility Specialist Stop Time (ACUTE ONLY) 0909  Mobility Specialist Time Calculation (min) (ACUTE ONLY) 24 min   Pt was found in bed and agreeable to ambulate. Had x1 cross step during session that she was able to self correct. Stated feeling short winded SPO2 checked to be 94%. At EOS returned to bed with all needs met. Call bell in reach and RN in room.  Billey Chang Mobility Specialist

## 2023-03-22 NOTE — Assessment & Plan Note (Signed)
S/p pacemaker in place

## 2023-03-22 NOTE — Assessment & Plan Note (Signed)
Clinically appears euvolemic. -Continue to monitor

## 2023-03-22 NOTE — Assessment & Plan Note (Signed)
Patient recently diagnosed with PE in May 2024, she was on Coumadin but missing a lot of doses and was found to be having subtherapeutic INR. Home Coumadin was switched with Eliquis at that time. -Holding Eliquis due to concern of GI bleed

## 2023-03-22 NOTE — Plan of Care (Signed)
Reinforce education. Monitor labs and vital signs. Monitor signs and symptoms of anemia. Transfuse as needed. Maintain telemetry  monitoring. Up with standby assistance. Encourage po intake.

## 2023-03-22 NOTE — Progress Notes (Addendum)
Daily Progress Note  DOA: 03/20/2023 Hospital Day: 3  Chief Complaint:  Anemia and melena. Colon mass on CT scan  ASSESSMENT & PLAN   Brief Narrative:  [name] is a 82 y.o. year old female with a history of  arthritis, asthma, COPD, hypertension, hyperlipidemia, atrial fibrillation, pacemaker placement , chronic diastolic CHF (LV EF 55 - 60 % per ECHO 12/2022), PE 12/09/2022 on Eliquis, cholelithiasis,  GERD.   Patient admitted 8/14 with symptomatic anemia and melena         Symptomatic acute on chronic anemia and melena She is iron deficient . CT scan showing ascending colon mass suspicious for malignancy. Suspect melena and anemia 2/2 to colon cancer.  -Hgb improved from 7 to 9.5 post 2 u PRBCs -Schedule for EGD and colonoscopy.  The risks and benefits of EGD and colonoscopy with possible biopsies were discussed with the patient who agrees to proceed.  -Clear liquids today, NPO after MN -Continue BID PPI -She received IV iron   Intussusception of the distal small bowel into the colon extending to the level of the proximal transverse colon on CT scan. No bowel obstruction. -Clinically asymptomatic  History of A-fib, on Eliquis at home -Last dose of Eliquis was 8/13 am   Subjective   Feels okay. No BMs / melena today.    Objective   Recent Labs    03/20/23 2316 03/21/23 1212 03/22/23 0414  WBC 3.9* 6.6 5.4  HGB 7.1* 10.8* 9.5*  HCT 24.6* 36.5 31.7*  PLT 229 218 200   BMET Recent Labs    03/20/23 2316 03/21/23 1212 03/22/23 0414  NA 139 137 138  K 2.9* 3.0* 3.3*  CL 111 108 113*  CO2 22 19* 19*  GLUCOSE 87 103* 89  BUN 10 10 7*  CREATININE 0.90 0.76 0.73  CALCIUM 7.8* 8.1* 7.1*   LFT Recent Labs    03/22/23 0414  PROT 4.5*  ALBUMIN 2.2*  AST 24  ALT 14  ALKPHOS 80  BILITOT 1.4*   PT/INR Recent Labs    03/22/23 0414  LABPROT 20.7*  INR 1.8*     Imaging:  CT ABDOMEN PELVIS W CONTRAST CLINICAL DATA:  Anemia.  Lower abdominal  pain.  EXAM: CT ABDOMEN AND PELVIS WITH CONTRAST  TECHNIQUE: Multidetector CT imaging of the abdomen and pelvis was performed using the standard protocol following bolus administration of intravenous contrast.  RADIATION DOSE REDUCTION: This exam was performed according to the departmental dose-optimization program which includes automated exposure control, adjustment of the mA and/or kV according to patient size and/or use of iterative reconstruction technique.  CONTRAST:  OMNIPAQUE IOHEXOL 300 MG/ML  SOLN  COMPARISON:  CT abdomen pelvis dated 11/23/2020.  FINDINGS: Lower chest: Left lung base atelectasis/scarring. Cardiac pacemaker wires noted.  No intra-abdominal free air or free fluid.  Hepatobiliary: The liver is unremarkable. There are multiple stones within the gallbladder. There is minimal haziness of the fat planes chest into the gallbladder. Ultrasound may provide better evaluation if there is clinical concern for acute cholecystitis.  Pancreas: Unremarkable. No pancreatic ductal dilatation or surrounding inflammatory changes.  Spleen: Normal in size without focal abnormality.  Adrenals/Urinary Tract: The adrenal glands unremarkable. Similar appearance of a cluster of rim calcified cystic lesions arising from the upper pole of the right kidney. There is no hydronephrosis on either side. There is symmetric enhancement and excretion of contrast by both kidneys. The visualized ureters appear unremarkable. The urinary bladder is collapsed.  Stomach/Bowel: There is sigmoid diverticulosis. There is intussusception of the distal small bowel into the colon extending to the level of the proximal transverse colon. There is a somewhat lobulated mass involving the proximal ascending colon measuring approximately 5 x 9 cm in greatest axial dimensions and functioning as a lead point and consistent with malignancy. Further evaluation with colonoscopy is recommended.  There is no bowel obstruction. The appendix is normal.  Vascular/Lymphatic: The abdominal aorta and IVC unremarkable. No portal venous gas. Several small rounded lymph node adjacent to the intussusception in the right lower quadrant suspicious for metastatic nodal involvement. These measure up to 7 mm.  Reproductive: Hysterectomy.  No adnexal masses.  Other: Small fat containing umbilical and supraumbilical hernia. No bowel herniation or fluid collection.  Musculoskeletal: Osteopenia with degenerative changes. No acute osseous pathology.  IMPRESSION: 1. 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 Further evaluation with colonoscopy is recommended. 2. Intussusception of the distal small bowel into the colon extending to the level of the proximal transverse colon. No bowel obstruction. 3. Sigmoid diverticulosis. 4. Cholelithiasis.  Electronically Signed   By: Elgie Collard M.D.   On: 03/21/2023 21:23     Scheduled inpatient medications:   atorvastatin  20 mg Oral Daily   mometasone-formoterol  2 puff Inhalation BID   montelukast  10 mg Oral QHS   pantoprazole (PROTONIX) IV  40 mg Intravenous Q12H   potassium chloride  20 mEq Oral BID   sodium chloride flush  3 mL Intravenous Q12H   Continuous inpatient infusions:   sodium chloride 75 mL/hr at 03/22/23 0351   sodium chloride     PRN inpatient medications: acetaminophen **OR** acetaminophen, bisacodyl, hydrALAZINE, HYDROcodone-acetaminophen, ipratropium, levalbuterol, morphine injection, ondansetron **OR** ondansetron (ZOFRAN) IV, senna-docusate, traZODone  Vital signs in last 24 hours: Temp:  [97.9 F (36.6 C)-98.3 F (36.8 C)] 98 F (36.7 C) (08/15 0645) Pulse Rate:  [66-77] 77 (08/15 0645) Resp:  [18-20] 18 (08/15 0645) BP: (111-127)/(57-81) 118/81 (08/15 0645) SpO2:  [98 %-100 %] 99 % (08/15 0815) Last BM Date : 03/22/23  Intake/Output Summary (Last 24 hours) at 03/22/2023 1130 Last data filed  at 03/22/2023 0351 Gross per 24 hour  Intake 3393.19 ml  Output --  Net 3393.19 ml    Intake/Output from previous day: 08/14 0701 - 08/15 0700 In: 3896.2 [P.O.:2403; I.V.:993.2; Blood:500] Out: -  Intake/Output this shift: No intake/output data recorded.   Physical Exam:  General: Alert female in NAD Heart:  Regular rate and rhythm.  Pulmonary: RLL wheezing, otherwise CTA.  Abdomen: Soft, nondistended, nontender. Normal bowel sounds. Neurologic: Alert and oriented Psych: Pleasant. Cooperative. Insight appears normal.    Principal Problem:   Symptomatic anemia     LOS: 0 days   Willette Cluster ,NP 03/22/2023, 11:30 AM   Attending physician's note   I have taken history, reviewed the chart and examined the patient. I performed a substantive portion of this encounter, including complete performance of at least one of the key components, in conjunction with the APP. I agree with the Advanced Practitioner's note, impression and recommendations.   CT showing distal small bowel intussusception into the colon with colonic mass. No obstruction. No further melena. Hb 9.5 after 2U A-fib/PE on Eliquis-last dose 8/13 Umbilical hernia.  Plan: -OK to prep for EGD/colon in AM -Would need Sx consult thereafter   Edman Circle, MD [facility] [phone]

## 2023-03-22 NOTE — Plan of Care (Signed)

## 2023-03-22 NOTE — Progress Notes (Signed)
Progress Note   Patient: [name] OZH:[id number] DOB: 1941-04-09 DOA: 03/20/2023     0 DOS: the patient was seen and examined on 03/22/2023   Brief hospital course: Taken from H&P.  [name] is a 82 y.o. female with a known history of, arthritis, asthma, CHF, COPD, GERD, hypertension, hyperlipidemia, recent PE in May 2024, paroxysmal atrial fibrillation s/p pacemaker on Eliquis presents to the emergency department for evaluation of shortness of breath, dyspnea on exertion, wheezing.  She does report dark sticky stools but no bright red blood, no vaginal bleeding.  She was found to have hemoglobin of 7.1, fecal occult blood positive.  COVID-negative.  8/14: Vital stable, received 2 unit of PRBC, GI was consulted.  Message sent to [facility]. Patient had a normal bowel movement today.  Last Eliquis dose was on Monday night. She was recently admitted in May 2024 and found to have a small subsegmental PE, she was on Coumadin before and was missing multiple doses and INR was subtherapeutic, she was started on Eliquis at that time. Anemia panel consistent with iron deficiency-also ordered 1 dose of IV iron.  8/15: Hemodynamically stable, hemoglobin at 9.5 s/p 2 unit of PRBC.  GI did a abdominal CT scan which shows a concerning mass in ascending colon likely malignant which is responsible for this iron deficiency anemia.  Patient is scheduled for EGD and colonoscopy tomorrow.  Assessment and Plan: * Symptomatic anemia Hemoglobin improved to 9.5 s/p 2 unit of PRBC.  No more melena. CT abdomen done by GI shows an ascending colon mass which can be malignant and responsible for this GI blood loss. -EGD and colonoscopy tomorrow -Continue holding Eliquis  Hypokalemia Also found to have hypomagnesemia at 1.6. -Replete potassium and magnesium and monitor  Chronic diastolic heart failure (HCC) Clinically appears euvolemic. -Continue to monitor   Pulmonary emboli [facility]) Patient recently  diagnosed with PE in May 2024, she was on Coumadin but missing a lot of doses and was found to be having subtherapeutic INR. Home Coumadin was switched with Eliquis at that time. 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  GERD (gastroesophageal reflux disease) Patient is currently on IV Protonix   Subjective: Patient was seen and examined today.  No new concern.  Did not had any more black stool.  Physical Exam: Vitals:   03/21/23 2157 03/22/23 0645 03/22/23 0815 03/22/23 1251  BP: 127/71 118/81  133/61  Pulse: 71 77  70  Resp:  18  18  Temp: 97.9 F (36.6 C) 98 F (36.7 C)  97.6 F (36.4 C)  TempSrc: Oral Oral  Oral  SpO2: 99% 100% 99% 100%  Weight:      Height:       General.  Frail elderly lady, in no acute distress. Pulmonary.  Lungs clear bilaterally, normal respiratory effort. CV.  Regular rate and rhythm, no JVD, rub or murmur. Abdomen.  Soft, nontender, nondistended, BS positive. CNS.  Alert and oriented .  No focal neurologic deficit. Extremities.  No edema, no cyanosis, pulses intact and symmetrical. Psychiatry.  Judgment and insight appears normal.   Data Reviewed: Prior data reviewed  Family Communication:   Disposition: Status is: Inpatient Remains inpatient appropriate because: Severity of illness  Planned Discharge Destination: Home  Time spent: 45 minutes  This record has been created using Conservation officer, historic buildings. Errors have been sought and corrected,but may not always be located. Such creation errors  do not reflect on the standard of care.   Author: Arnetha Courser, MD 03/22/2023 2:13 PM  For on call review [email or web address].

## 2023-03-22 NOTE — Anesthesia Preprocedure Evaluation (Addendum)
Anesthesia Evaluation  Patient identified by MRN, date of birth, ID band Patient awake    Reviewed: Allergy & Precautions, NPO status , Patient's Chart, lab work & pertinent test results  Airway Mallampati: II  TM Distance: >3 FB Neck ROM: Full    Dental no notable dental hx. (+) Upper Dentures, Missing, Dental Advisory Given,    Pulmonary shortness of breath, asthma , former smoker   Pulmonary exam normal breath sounds clear to auscultation       Cardiovascular hypertension, Pt. on medications +CHF  Normal cardiovascular exam+ pacemaker (on Eliquis)  Rhythm:Regular Rate:Normal     Neuro/Psych neg Headaches    GI/Hepatic ,GERD  Medicated and Controlled,,  Endo/Other    Renal/GU Renal diseaseLab Results      Component                Value               Date                       K                        3.3 (L)             03/22/2023                 CREATININE               0.73                03/22/2023                     Musculoskeletal  (+) Arthritis , Osteoarthritis,    Abdominal   Peds  Hematology  (+) Blood dyscrasia, anemia Lab Results      Component                Value               Date                      WBC                      5.4                 03/22/2023                HGB                      9.5 (L)             03/22/2023                HCT                      31.7 (L)            03/22/2023                MCV                      72.5 (L)            03/22/2023                PLT  200                 03/22/2023              Anesthesia Other Findings   Reproductive/Obstetrics                             Anesthesia Physical Anesthesia Plan  ASA: 3  Anesthesia Plan: MAC   Post-op Pain Management:    Induction: Intravenous  PONV Risk Score and Plan: 2 and Treatment may vary due to age or medical condition and Propofol infusion  Airway  Management Planned: Nasal Cannula and Natural Airway  Additional Equipment: None  Intra-op Plan:   Post-operative Plan:   Informed Consent: I have reviewed the patients History and Physical, chart, labs and discussed the procedure including the risks, benefits and alternatives for the proposed anesthesia with the patient or authorized representative who has indicated his/her understanding and acceptance.     Dental advisory given  Plan Discussed with:   Anesthesia Plan Comments: (EGD colon for Melena anf iro deficiecy anemia)        Anesthesia Quick Evaluation

## 2023-03-22 NOTE — H&P (View-Only) (Signed)
 Daily Progress Note  DOA: 03/20/2023 Hospital Day: 3  Chief Complaint:  Anemia and melena. Colon mass on CT scan  ASSESSMENT & PLAN   Brief Narrative:  [name] is a 82 y.o. year old female with a history of  arthritis, asthma, COPD, hypertension, hyperlipidemia, atrial fibrillation, pacemaker placement , chronic diastolic CHF (LV EF 55 - 60 % per ECHO 12/2022), PE 12/09/2022 on Eliquis, cholelithiasis,  GERD.   Patient admitted 8/14 with symptomatic anemia and melena         Symptomatic acute on chronic anemia and melena She is iron deficient . CT scan showing ascending colon mass suspicious for malignancy. Suspect melena and anemia 2/2 to colon cancer.  -Hgb improved from 7 to 9.5 post 2 u PRBCs -Schedule for EGD and colonoscopy.  The risks and benefits of EGD and colonoscopy with possible biopsies were discussed with the patient who agrees to proceed.  -Clear liquids today, NPO after MN -Continue BID PPI -She received IV iron   Intussusception of the distal small bowel into the colon extending to the level of the proximal transverse colon on CT scan. No bowel obstruction. -Clinically asymptomatic  History of A-fib, on Eliquis at home -Last dose of Eliquis was 8/13 am   Subjective   Feels okay. No BMs / melena today.    Objective   Recent Labs    03/20/23 2316 03/21/23 1212 03/22/23 0414  WBC 3.9* 6.6 5.4  HGB 7.1* 10.8* 9.5*  HCT 24.6* 36.5 31.7*  PLT 229 218 200   BMET Recent Labs    03/20/23 2316 03/21/23 1212 03/22/23 0414  NA 139 137 138  K 2.9* 3.0* 3.3*  CL 111 108 113*  CO2 22 19* 19*  GLUCOSE 87 103* 89  BUN 10 10 7*  CREATININE 0.90 0.76 0.73  CALCIUM 7.8* 8.1* 7.1*   LFT Recent Labs    03/22/23 0414  PROT 4.5*  ALBUMIN 2.2*  AST 24  ALT 14  ALKPHOS 80  BILITOT 1.4*   PT/INR Recent Labs    03/22/23 0414  LABPROT 20.7*  INR 1.8*     Imaging:  CT ABDOMEN PELVIS W CONTRAST CLINICAL DATA:  Anemia.  Lower abdominal  pain.  EXAM: CT ABDOMEN AND PELVIS WITH CONTRAST  TECHNIQUE: Multidetector CT imaging of the abdomen and pelvis was performed using the standard protocol following bolus administration of intravenous contrast.  RADIATION DOSE REDUCTION: This exam was performed according to the departmental dose-optimization program which includes automated exposure control, adjustment of the mA and/or kV according to patient size and/or use of iterative reconstruction technique.  CONTRAST:  OMNIPAQUE IOHEXOL 300 MG/ML  SOLN  COMPARISON:  CT abdomen pelvis dated 11/23/2020.  FINDINGS: Lower chest: Left lung base atelectasis/scarring. Cardiac pacemaker wires noted.  No intra-abdominal free air or free fluid.  Hepatobiliary: The liver is unremarkable. There are multiple stones within the gallbladder. There is minimal haziness of the fat planes chest into the gallbladder. Ultrasound may provide better evaluation if there is clinical concern for acute cholecystitis.  Pancreas: Unremarkable. No pancreatic ductal dilatation or surrounding inflammatory changes.  Spleen: Normal in size without focal abnormality.  Adrenals/Urinary Tract: The adrenal glands unremarkable. Similar appearance of a cluster of rim calcified cystic lesions arising from the upper pole of the right kidney. There is no hydronephrosis on either side. There is symmetric enhancement and excretion of contrast by both kidneys. The visualized ureters appear unremarkable. The urinary bladder is collapsed.  Stomach/Bowel: There is sigmoid diverticulosis. There is intussusception of the distal small bowel into the colon extending to the level of the proximal transverse colon. There is a somewhat lobulated mass involving the proximal ascending colon measuring approximately 5 x 9 cm in greatest axial dimensions and functioning as a lead point and consistent with malignancy. Further evaluation with colonoscopy is recommended.  There is no bowel obstruction. The appendix is normal.  Vascular/Lymphatic: The abdominal aorta and IVC unremarkable. No portal venous gas. Several small rounded lymph node adjacent to the intussusception in the right lower quadrant suspicious for metastatic nodal involvement. These measure up to 7 mm.  Reproductive: Hysterectomy.  No adnexal masses.  Other: Small fat containing umbilical and supraumbilical hernia. No bowel herniation or fluid collection.  Musculoskeletal: Osteopenia with degenerative changes. No acute osseous pathology.  IMPRESSION: 1. 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 Further evaluation with colonoscopy is recommended. 2. Intussusception of the distal small bowel into the colon extending to the level of the proximal transverse colon. No bowel obstruction. 3. Sigmoid diverticulosis. 4. Cholelithiasis.  Electronically Signed   By: Elgie Collard M.D.   On: 03/21/2023 21:23     Scheduled inpatient medications:   atorvastatin  20 mg Oral Daily   mometasone-formoterol  2 puff Inhalation BID   montelukast  10 mg Oral QHS   pantoprazole (PROTONIX) IV  40 mg Intravenous Q12H   potassium chloride  20 mEq Oral BID   sodium chloride flush  3 mL Intravenous Q12H   Continuous inpatient infusions:   sodium chloride 75 mL/hr at 03/22/23 0351   sodium chloride     PRN inpatient medications: acetaminophen **OR** acetaminophen, bisacodyl, hydrALAZINE, HYDROcodone-acetaminophen, ipratropium, levalbuterol, morphine injection, ondansetron **OR** ondansetron (ZOFRAN) IV, senna-docusate, traZODone  Vital signs in last 24 hours: Temp:  [97.9 F (36.6 C)-98.3 F (36.8 C)] 98 F (36.7 C) (08/15 0645) Pulse Rate:  [66-77] 77 (08/15 0645) Resp:  [18-20] 18 (08/15 0645) BP: (111-127)/(57-81) 118/81 (08/15 0645) SpO2:  [98 %-100 %] 99 % (08/15 0815) Last BM Date : 03/22/23  Intake/Output Summary (Last 24 hours) at 03/22/2023 1130 Last data filed  at 03/22/2023 0351 Gross per 24 hour  Intake 3393.19 ml  Output --  Net 3393.19 ml    Intake/Output from previous day: 08/14 0701 - 08/15 0700 In: 3896.2 [P.O.:2403; I.V.:993.2; Blood:500] Out: -  Intake/Output this shift: No intake/output data recorded.   Physical Exam:  General: Alert female in NAD Heart:  Regular rate and rhythm.  Pulmonary: RLL wheezing, otherwise CTA.  Abdomen: Soft, nondistended, nontender. Normal bowel sounds. Neurologic: Alert and oriented Psych: Pleasant. Cooperative. Insight appears normal.    Principal Problem:   Symptomatic anemia     LOS: 0 days   Willette Cluster ,NP 03/22/2023, 11:30 AM   Attending physician's note   I have taken history, reviewed the chart and examined the patient. I performed a substantive portion of this encounter, including complete performance of at least one of the key components, in conjunction with the APP. I agree with the Advanced Practitioner's note, impression and recommendations.   CT showing distal small bowel intussusception into the colon with colonic mass. No obstruction. No further melena. Hb 9.5 after 2U A-fib/PE on Eliquis-last dose 8/13 Umbilical hernia.  Plan: -OK to prep for EGD/colon in AM -Would need Sx consult thereafter   Edman Circle, MD [facility] [phone]

## 2023-03-23 ENCOUNTER — Encounter (HOSPITAL_COMMUNITY): Payer: Self-pay | Admitting: Family Medicine

## 2023-03-23 ENCOUNTER — Encounter (HOSPITAL_COMMUNITY)
Admission: EM | Disposition: A | Discharge status: Skilled Nursing Facility | Payer: Self-pay | Source: Home / Self Care | Attending: Internal Medicine

## 2023-03-23 ENCOUNTER — Inpatient Hospital Stay (HOSPITAL_COMMUNITY): Payer: Medicare HMO | Admitting: Anesthesiology

## 2023-03-23 DIAGNOSIS — I110 Hypertensive heart disease with heart failure: Secondary | ICD-10-CM

## 2023-03-23 DIAGNOSIS — K5730 Diverticulosis of large intestine without perforation or abscess without bleeding: Secondary | ICD-10-CM

## 2023-03-23 DIAGNOSIS — I5033 Acute on chronic diastolic (congestive) heart failure: Secondary | ICD-10-CM

## 2023-03-23 DIAGNOSIS — D490 Neoplasm of unspecified behavior of digestive system: Secondary | ICD-10-CM

## 2023-03-23 DIAGNOSIS — Z87891 Personal history of nicotine dependence: Secondary | ICD-10-CM

## 2023-03-23 DIAGNOSIS — K56691 Other complete intestinal obstruction: Secondary | ICD-10-CM

## 2023-03-23 DIAGNOSIS — D500 Iron deficiency anemia secondary to blood loss (chronic): Secondary | ICD-10-CM

## 2023-03-23 DIAGNOSIS — D649 Anemia, unspecified: Secondary | ICD-10-CM | POA: Diagnosis not present

## 2023-03-23 DIAGNOSIS — K317 Polyp of stomach and duodenum: Secondary | ICD-10-CM

## 2023-03-23 DIAGNOSIS — K640 First degree hemorrhoids: Secondary | ICD-10-CM | POA: Diagnosis not present

## 2023-03-23 HISTORY — PX: COLONOSCOPY WITH PROPOFOL: SHX5780

## 2023-03-23 HISTORY — PX: ESOPHAGOGASTRODUODENOSCOPY (EGD) WITH PROPOFOL: SHX5813

## 2023-03-23 HISTORY — PX: SUBMUCOSAL TATTOO INJECTION: SHX6856

## 2023-03-23 HISTORY — PX: BIOPSY: SHX5522

## 2023-03-23 SURGERY — ESOPHAGOGASTRODUODENOSCOPY (EGD) WITH PROPOFOL
Anesthesia: Monitor Anesthesia Care

## 2023-03-23 MED ORDER — POTASSIUM CHLORIDE CRYS ER 20 MEQ PO TBCR
40.0000 meq | EXTENDED_RELEASE_TABLET | Freq: Two times a day (BID) | ORAL | Status: AC
  Administered 2023-03-23 (×2): 40 meq via ORAL
  Filled 2023-03-23 (×2): qty 2

## 2023-03-23 MED ORDER — PHENYLEPHRINE HCL (PRESSORS) 10 MG/ML IV SOLN
INTRAVENOUS | Status: DC | PRN
  Administered 2023-03-23 (×2): 80 ug via INTRAVENOUS
  Administered 2023-03-23: 400 ug via INTRAVENOUS

## 2023-03-23 MED ORDER — SPOT INK MARKER SYRINGE KIT
PACK | SUBMUCOSAL | Status: AC
  Filled 2023-03-23: qty 5

## 2023-03-23 MED ORDER — BOOST / RESOURCE BREEZE PO LIQD CUSTOM
1.0000 | Freq: Three times a day (TID) | ORAL | Status: DC
  Administered 2023-03-23 – 2023-03-30 (×15): 1 via ORAL

## 2023-03-23 MED ORDER — ENSURE ENLIVE PO LIQD
237.0000 mL | Freq: Two times a day (BID) | ORAL | Status: DC
  Filled 2023-03-23 (×3): qty 237

## 2023-03-23 MED ORDER — LIDOCAINE 2% (20 MG/ML) 5 ML SYRINGE
INTRAMUSCULAR | Status: DC | PRN
Start: 2023-03-23 — End: 2023-03-23
  Administered 2023-03-23: 80 mg via INTRAVENOUS

## 2023-03-23 MED ORDER — PROPOFOL 500 MG/50ML IV EMUL
INTRAVENOUS | Status: DC | PRN
  Administered 2023-03-23: 100 ug/kg/min via INTRAVENOUS

## 2023-03-23 MED ORDER — LACTATED RINGERS IV SOLN
INTRAVENOUS | Status: AC | PRN
  Administered 2023-03-23: 1000 mL via INTRAVENOUS

## 2023-03-23 MED ORDER — PROPOFOL 10 MG/ML IV BOLUS
INTRAVENOUS | Status: DC | PRN
  Administered 2023-03-23: 20 mg via INTRAVENOUS
  Administered 2023-03-23 (×2): 10 mg via INTRAVENOUS

## 2023-03-23 MED ORDER — PHENYLEPHRINE HCL-NACL 20-0.9 MG/250ML-% IV SOLN
INTRAVENOUS | Status: DC | PRN
Start: 2023-03-23 — End: 2023-03-23
  Administered 2023-03-23: 50 ug/min via INTRAVENOUS

## 2023-03-23 MED ORDER — SPOT INK MARKER SYRINGE KIT
PACK | SUBMUCOSAL | Status: DC | PRN
Start: 2023-03-23 — End: 2023-03-23
  Administered 2023-03-23: 2 mL via SUBMUCOSAL

## 2023-03-23 MED ORDER — VASOPRESSIN 20 UNIT/ML IV SOLN
INTRAVENOUS | Status: DC | PRN
Start: 2023-03-23 — End: 2023-03-23
  Administered 2023-03-23: 1 [IU] via INTRAVENOUS

## 2023-03-23 SURGICAL SUPPLY — 25 items

## 2023-03-23 NOTE — Progress Notes (Signed)
PROGRESS NOTE    [name]  ZOX:[id number] DOB: [birth year] DOA: 03/20/2023 PCP: Philip Aspen, Limmie Patricia, MD    Brief Narrative:  82 year old female with history of COPD, GERD, hypertension, hyperlipidemia and recent history of pulmonary embolism 12/2022, paroxysmal A-fib status post pacemaker presented to the ER with shortness of breath, dyspnea on exertion and wheezing along with reports of dark sticky stools.  In the emergency room hemoglobin 7.1.  She was admitted with acute GI bleeding, received 2 units of PRBC.  Patient was on Coumadin for paroxysmal A-fib with irregular dosing, she has been on Eliquis since the diagnosis.  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 Underwent EGD colonoscopy and found to have nearly obstructive colonic mass.    Assessment & Plan:   Symptomatic anemia, anemia of acute blood loss due to lower GI bleeding likely secondary to malignant colon lesion. Patient received 2 units of PRBC with appropriate response.  Hemoglobin 9.5 yesterday.  Will repeat tomorrow. Underwent EGD, no acute findings. Underwent colonoscopy, large fungating polypoid sessile and ulcerated nearly completely obstructing mass proximal transverse colon.  No evidence of complete bowel obstruction.  Patient referred to surgery for surgical resection.  Will continue to monitor hemoglobin and transfuse as needed.  Hypokalemia and hypomagnesemia: Replaced.  Recheck tomorrow.  Pulmonary embolism: Recently diagnosed.  Holding anticoagulation due to active bleeding.  PE was subsegmental.  Currently no indication for bridging with heparin.  Chronic medical issues including Chronic diastolic heart failure, euvolemic Tachybradycardia syndrome status post pacemaker GERD, on Protonix   DVT prophylaxis: SCDs Start: 03/21/23 0911   Code Status: Full code Family Communication: Patient's son, Mr. Simonne Come over the speaker phone before procedure.  GI has updated  family about findings. Disposition Plan: Status is: Inpatient Remains inpatient appropriate because: Inpatient procedures, possible need for surgery     Consultants:  Gastroenterology General surgery  Procedures:  EGD and colonoscopy  Antimicrobials:  None   Subjective: I examined patient in the morning rounds.  Pleasant and interactive.  Denies any complaints.  Denied any nausea vomiting abdominal pain or cramping.  She had bowel prep overnight with no evidence of bleeding.  Objective: Vitals:   03/23/23 1217 03/23/23 1353 03/23/23 1400 03/23/23 1410  BP: (!) 124/57 132/66 (!) 127/50 (!) 118/53  Pulse: 82 83 79 84  Resp: 17 (!) 21 19 (!) 25  Temp: (!) 97.1 F (36.2 C) (!) 97 F (36.1 C)    TempSrc: Temporal Temporal    SpO2: 97% 99% 96% 96%  Weight:      Height:        Intake/Output Summary (Last 24 hours) at 03/23/2023 1429 Last data filed at 03/23/2023 1348 Gross per 24 hour  Intake 803 ml  Output --  Net 803 ml   Filed Weights   03/20/23 2142  Weight: 85.7 kg    Examination:  General exam: Calm and comfortable. Frail looking.  Respiratory system: No added sounds. Cardiovascular system: S1 & S2 heard, RRR.  Pacemaker in place. Gastrointestinal system: Abdomen is nondistended, soft and nontender. No organomegaly or masses felt. Normal bowel sounds heard. Central nervous system: Alert and oriented. No focal neurological deficits.  Generalized weakness.   Data Reviewed: I have personally reviewed following labs and imaging studies  CBC: Recent Labs  Lab 03/20/23 2316 03/21/23 1212 03/22/23 0414  WBC 3.9* 6.6 5.4  NEUTROABS 2.2  --   --   HGB 7.1* 10.8* 9.5*  HCT 24.6*  36.5 31.7*  MCV 68.1* 73.3* 72.5*  PLT 229 218 200   Basic Metabolic Panel: Recent Labs  Lab 03/20/23 2316 03/21/23 1212 03/22/23 0413 03/22/23 0414  NA 139 137  --  138  K 2.9* 3.0*  --  3.3*  CL 111 108  --  113*  CO2 22 19*  --  19*  GLUCOSE 87 103*  --  89  BUN 10 10   --  7*  CREATININE 0.90 0.76  --  0.73  CALCIUM 7.8* 8.1*  --  7.1*  MG  --  1.7 1.6*  --    GFR: Estimated Creatinine Clearance: 57.8 mL/min (by C-G formula based on SCr of 0.73 mg/dL). Liver Function Tests: Recent Labs  Lab 03/22/23 0414  AST 24  ALT 14  ALKPHOS 80  BILITOT 1.4*  PROT 4.5*  ALBUMIN 2.2*   No results for input(s): "LIPASE", "AMYLASE" in the last 168 hours. No results for input(s): "AMMONIA" in the last 168 hours. Coagulation Profile: Recent Labs  Lab 03/22/23 0414  INR 1.8*   Cardiac Enzymes: No results for input(s): "CKTOTAL", "CKMB", "CKMBINDEX", "TROPONINI" in the last 168 hours. BNP (last 3 results) No results for input(s): "PROBNP" in the last 8760 hours. HbA1C: No results for input(s): "HGBA1C" in the last 72 hours. CBG: No results for input(s): "GLUCAP" in the last 168 hours. Lipid Profile: No results for input(s): "CHOL", "HDL", "LDLCALC", "TRIG", "CHOLHDL", "LDLDIRECT" in the last 72 hours. Thyroid Function Tests: No results for input(s): "TSH", "T4TOTAL", "FREET4", "T3FREE", "THYROIDAB" in the last 72 hours. Anemia Panel: Recent Labs    03/21/23 1212  VITAMINB12 906  FOLATE 10.2  FERRITIN 7*  TIBC 314  IRON 19*   Sepsis Labs: No results for input(s): "PROCALCITON", "LATICACIDVEN" in the last 168 hours.  Recent Results (from the past 240 hour(s))  SARS Coronavirus 2 by RT PCR (hospital order, performed in [facility] hospital lab) *cepheid single result test* Anterior Nasal Swab     Status: None   Collection Time: 03/20/23 11:31 PM   Specimen: Anterior Nasal Swab  Result Value Ref Range Status   SARS Coronavirus 2 by RT PCR NEGATIVE NEGATIVE Final    Comment: (NOTE) SARS-CoV-2 target nucleic acids are NOT DETECTED.  The SARS-CoV-2 RNA is generally detectable in upper and lower respiratory specimens during the acute phase of infection. The lowest concentration of SARS-CoV-2 viral copies this assay can detect is 250 copies / mL.  A negative result does not preclude SARS-CoV-2 infection and should not be used as the sole basis for treatment or other patient management decisions.  A negative result may occur with improper specimen collection / handling, submission of specimen other than nasopharyngeal swab, presence of viral mutation(s) within the areas targeted by this assay, and inadequate number of viral copies (<250 copies / mL). A negative result must be combined with clinical observations, patient history, and epidemiological information.  Fact Sheet for Patients:   RoadLapTop.co.za  Fact Sheet for Healthcare Providers: http://kim-miller.com/  This test is not yet approved or  cleared by the [place] FDA and has been authorized for detection and/or diagnosis of SARS-CoV-2 by FDA under an Emergency Use Authorization (EUA).  This EUA will remain in effect (meaning this test can be used) for the duration of the COVID-19 declaration under Section 564(b)(1) of the Act, 21 U.S.C. section 360bbb-3(b)(1), unless the authorization is terminated or revoked sooner.  Performed at [facility], [address] [place], [place] [address]  Radiology Studies: CT ABDOMEN PELVIS W CONTRAST  Result Date: 03/21/2023 CLINICAL DATA:  Anemia.  Lower abdominal pain. EXAM: CT ABDOMEN AND PELVIS WITH CONTRAST TECHNIQUE: Multidetector CT imaging of the abdomen and pelvis was performed using the standard protocol following bolus administration of intravenous contrast. RADIATION DOSE REDUCTION: This exam was performed according to the departmental dose-optimization program which includes automated exposure control, adjustment of the mA and/or kV according to patient size and/or use of iterative reconstruction technique. CONTRAST:  OMNIPAQUE IOHEXOL 300 MG/ML  SOLN COMPARISON:  CT abdomen pelvis dated 11/23/2020. FINDINGS: Lower chest: Left lung base  atelectasis/scarring. Cardiac pacemaker wires noted. No intra-abdominal free air or free fluid. Hepatobiliary: The liver is unremarkable. There are multiple stones within the gallbladder. There is minimal haziness of the fat planes chest into the gallbladder. Ultrasound may provide better evaluation if there is clinical concern for acute cholecystitis. Pancreas: Unremarkable. No pancreatic ductal dilatation or surrounding inflammatory changes. Spleen: Normal in size without focal abnormality. Adrenals/Urinary Tract: The adrenal glands unremarkable. Similar appearance of a cluster of rim calcified cystic lesions arising from the upper pole of the right kidney. There is no hydronephrosis on either side. There is symmetric enhancement and excretion of contrast by both kidneys. The visualized ureters appear unremarkable. The urinary bladder is collapsed. Stomach/Bowel: There is sigmoid diverticulosis. There is intussusception of the distal small bowel into the colon extending to the level of the proximal transverse colon. There is a somewhat lobulated mass involving the proximal ascending colon measuring approximately 5 x 9 cm in greatest axial dimensions and functioning as a lead point and consistent with malignancy. Further evaluation with colonoscopy is recommended. There is no bowel obstruction. The appendix is normal. Vascular/Lymphatic: The abdominal aorta and IVC unremarkable. No portal venous gas. Several small rounded lymph node adjacent to the intussusception in the right lower quadrant suspicious for metastatic nodal involvement. These measure up to 7 mm. Reproductive: Hysterectomy.  No adnexal masses. Other: Small fat containing umbilical and supraumbilical hernia. No bowel herniation or fluid collection. Musculoskeletal: Osteopenia with degenerative changes. No acute osseous pathology. IMPRESSION: 1. 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 Further evaluation with colonoscopy is  recommended. 2. Intussusception of the distal small bowel into the colon extending to the level of the proximal transverse colon. No bowel obstruction. 3. Sigmoid diverticulosis. 4. Cholelithiasis. Electronically Signed   By: Elgie Collard M.D.   On: 03/21/2023 21:23        Scheduled Meds:  atorvastatin  20 mg Oral Daily   feeding supplement  237 mL Oral BID BM   mometasone-formoterol  2 puff Inhalation BID   montelukast  10 mg Oral QHS   pantoprazole (PROTONIX) IV  40 mg Intravenous Q12H   potassium chloride  40 mEq Oral BID   sodium chloride flush  3 mL Intravenous Q12H   Continuous Infusions:   LOS: 1 day    Time spent: 35 minutes    Dorcas Carrow, MD [facility]

## 2023-03-23 NOTE — Op Note (Signed)
[facility] Patient Name: [name] Procedure Date: 03/23/2023 MRN: [id number] Attending MD: Lynann Bologna , MD, [id number] Date of Birth: [birth year] CSN: [id number] Age: 82 Admit Type: Inpatient Procedure:                Colonoscopy Indications:              Melena/new IDA with CT showing transverse colon                            mass with intussusception. Providers:                Lynann Bologna, MD, Fransisca Connors, Salley Scarlet, Technician, Deri Fuelling, CRNA Referring MD:              Medicines:                Monitored Anesthesia Care Complications:            No immediate complications. Estimated Blood Loss:     Estimated blood loss: none. Procedure:                Pre-Anesthesia Assessment:                           - Prior to the procedure, a History and Physical                            was performed, and patient medications and                            allergies were reviewed. The patient's tolerance of                            previous anesthesia was also reviewed. The risks                            and benefits of the procedure and the sedation                            options and risks were discussed with the patient.                            All questions were answered, and informed consent                            was obtained. Prior Anticoagulants: The patient has                            taken Eliquis (apixaban), last dose was 3 days                            prior to procedure. ASA Grade Assessment: III - A  patient with severe systemic disease. After                            reviewing the risks and benefits, the patient was                            deemed in satisfactory condition to undergo the                            procedure.                           After obtaining informed consent, the colonoscope                            was passed under direct vision.  Throughout the                            procedure, the patient's blood pressure, pulse, and                            oxygen saturations were monitored continuously. The                            [id number]-HQ190L ([id number]) Olympus colonoscope was                            introduced through the anus with the intention of                            advancing to the cecum. The scope was advanced to                            the transverse colon before the procedure was                            aborted. Medications were given. The colonoscopy                            was performed without difficulty. The patient                            tolerated the procedure well. The quality of the                            bowel preparation was good. The rectum was                            photographed. Scope In: 1:30:59 PM Scope Out: 1:42:09 PM Scope Withdrawal Time: 0 hours 7 minutes 11 seconds  Total Procedure Duration: 0 hours 11 minutes 10 seconds  Findings:      A large fungating, polypoid, sessile and ulcerated nearly completely       obstructing mass was found in the proximal transverse colon. The mass  was circumferential. This did not allow passage of pediatric       colonoscope. This was biopsied with a cold forceps for histology. Area       was tattooed with an injection of 2 mL of [place] ink just distal to the       mass.      A few medium-mouthed diverticula were found in the sigmoid colon.      Non-bleeding internal hemorrhoids were found during retroflexion. The       hemorrhoids were small and Grade I (internal hemorrhoids that do not       prolapse). 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 Biopsied. Tattooed.                           - Mild sigmoid diverticulosis.                           - Non-bleeding internal hemorrhoids. Moderate Sedation:      Not Applicable - Patient  had care per Anesthesia. Recommendation:           - Patient has a contact number available for                            emergencies. The signs and symptoms of potential                            delayed complications were discussed with the                            patient. Return to normal activities tomorrow.                            Written discharge instructions were provided to the                            patient.                           - Full liquid diet.                           - Continue present medications.                           - Await pathology results.                           - Surgical consultation.                           - Would recommend to hold Eliquis for now.                           - Trend CBC,  CMP. Check CEA level.                           - The findings and recommendations were discussed                            with the patient's family. Procedure Code(s):        --- Professional ---                           [phone], 52, Colonoscopy, flexible; with biopsy,                            single or multiple                           [id number], 52, Colonoscopy, flexible; with directed                            submucosal injection(s), any substance Diagnosis Code(s):        --- Professional ---                           D49.0, Neoplasm of unspecified behavior of                            digestive system                           K56.691, Other complete intestinal obstruction                           K64.0, First degree hemorrhoids                           K92.1, Melena (includes Hematochezia)                           K57.30, Diverticulosis of large intestine without                            perforation or abscess without bleeding CPT copyright 2022 American Medical Association. All rights reserved. The codes documented in this report are preliminary and upon coder review may  be revised to meet current compliance requirements. Lynann Bologna, MD 03/23/2023 1:53:22 PM This report has been signed electronically. Number of Addenda: 0

## 2023-03-23 NOTE — Transfer of Care (Signed)
Immediate Anesthesia Transfer of Care Note  Patient: [name]  Procedure(s) Performed: ESOPHAGOGASTRODUODENOSCOPY (EGD) WITH PROPOFOL COLONOSCOPY WITH PROPOFOL  Patient Location: PACU  Anesthesia Type:General  Level of Consciousness: awake and alert   Airway & Oxygen Therapy: Patient Spontanous Breathing and Patient connected to face mask oxygen  Post-op Assessment: Report given to RN and Post -op Vital signs reviewed and stable  Post vital signs: Reviewed and stable  Last Vitals:  Vitals Value Taken Time  BP 132/66 03/23/23 1351  Temp    Pulse 83 03/23/23 1353  Resp 21 03/23/23 1353  SpO2 99 % 03/23/23 1353  Vitals shown include unfiled device data.  Last Pain:  Vitals:   03/23/23 1217  TempSrc: Temporal  PainSc: 0-No pain         Complications: No notable events documented.

## 2023-03-23 NOTE — Interval H&P Note (Signed)
History and Physical Interval Note:  03/23/2023 12:58 PM  [name]  has presented today for surgery, with the diagnosis of melena and iron deficient anemia.  The various methods of treatment have been discussed with the patient and family. After consideration of risks, benefits and other options for treatment, the patient has consented to  Procedure(s): ESOPHAGOGASTRODUODENOSCOPY (EGD) WITH PROPOFOL (N/A) COLONOSCOPY WITH PROPOFOL (N/A) as a surgical intervention.  The patient's history has been reviewed, patient examined, no change in status, stable for surgery.  I have reviewed the patient's chart and labs.  Questions were answered to the patient's satisfaction.     [name]

## 2023-03-23 NOTE — Op Note (Signed)
[facility] Patient Name: [name] Procedure Date: 03/23/2023 MRN: [id number] Attending MD: Lynann Bologna , MD, [id number] Date of Birth: 16-Mar-1941 CSN: [id number] Age: 82 Admit Type: Inpatient Procedure:                Upper GI endoscopy Indications:              Iron deficiency anemia secondary to chronic blood                            loss Providers:                Lynann Bologna, MD, Fransisca Connors, Salley Scarlet, Technician, Deri Fuelling, CRNA Referring MD:             , Medicines:                Monitored Anesthesia Care Complications:            No immediate complications. Estimated Blood Loss:     Estimated blood loss: none. Procedure:                Pre-Anesthesia Assessment:                           - Prior to the procedure, a History and Physical                            was performed, and patient medications and                            allergies were reviewed. The patient's tolerance of                            previous anesthesia was also reviewed. The risks                            and benefits of the procedure and the sedation                            options and risks were discussed with the patient.                            All questions were answered, and informed consent                            was obtained. Prior Anticoagulants: Eliquis on hold                            for 3 days prior. ASA Grade Assessment: III - A                            patient with severe systemic disease. After  reviewing the risks and benefits, the patient was                            deemed in satisfactory condition to undergo the                            procedure.                           After obtaining informed consent, the endoscope was                            passed under direct vision. Throughout the                            procedure, the patient's blood pressure, pulse,  and                            oxygen saturations were monitored continuously. The                            [id number]-[id number] ([id number]) Olympus endoscope was introduced                            through the mouth, and advanced to the second part                            of duodenum. The upper GI endoscopy was                            accomplished without difficulty. The patient                            tolerated the procedure well. Scope In: Scope Out: Findings:      The examined esophagus was normal with well-defined Z-line at 38 cm.      The entire examined stomach was normal. Biopsies were taken with a cold       forceps for histology.      A single 8 mm sessile polyp with no bleeding and no stigmata of recent       bleeding was found in the gastric body. Biopsies were taken with a cold       forceps for histology.      The examined duodenum was normal. Impression:               - Normal EGD                           - A single gastric polyp. Biopsied. Moderate Sedation:      Not Applicable - Patient had care per Anesthesia. Recommendation:           - Proceed with colonoscopy.                           - Resume previous diet.                           -  Continue present medications.                           - Await pathology results.                           - The findings and recommendations were discussed                            with the patient's family. Procedure Code(s):        --- Professional ---                           [id number], Esophagogastroduodenoscopy, flexible,                            transoral; with biopsy, single or multiple Diagnosis Code(s):        --- Professional ---                           K31.7, Polyp of stomach and duodenum                           D50.0, Iron deficiency anemia secondary to blood                            loss (chronic) CPT copyright 2022 American Medical Association. All rights reserved. The codes documented in this report are  preliminary and upon coder review may  be revised to meet current compliance requirements. Lynann Bologna, MD 03/23/2023 1:27:23 PM This report has been signed electronically. Number of Addenda: 0

## 2023-03-23 NOTE — Anesthesia Postprocedure Evaluation (Signed)
Anesthesia Post Note  Patient: [name]  Procedure(s) Performed: ESOPHAGOGASTRODUODENOSCOPY (EGD) WITH PROPOFOL COLONOSCOPY WITH PROPOFOL     Patient location during evaluation: Endoscopy Anesthesia Type: MAC Level of consciousness: awake and alert Pain management: pain level controlled Vital Signs Assessment: post-procedure vital signs reviewed and stable Respiratory status: spontaneous breathing, nonlabored ventilation, respiratory function stable and patient connected to nasal cannula oxygen Cardiovascular status: blood pressure returned to baseline and stable Postop Assessment: no apparent nausea or vomiting Anesthetic complications: no   No notable events documented.  Last Vitals:  Vitals:   03/23/23 1400 03/23/23 1410  BP: (!) 127/50 (!) 118/53  Pulse: 79 84  Resp: 19 (!) 25  Temp:    SpO2: 96% 96%    Last Pain:  Vitals:   03/23/23 1410  TempSrc:   PainSc: 0-No pain                 Trevor Iha

## 2023-03-23 NOTE — TOC Initial Note (Signed)
Transition of Care [facility]) - Initial/Assessment Note    Patient Details  Name: Jaeliana Glazer MRN: [id number] Date of Birth: March 20, 1941  [facility] CM/SW Contact:    Howell Rucks, RN Phone Number: 03/23/2023, 12:48 PM  Clinical Narrative:  Patient off floor for procedure, initial assessment pending.  TOC will continue to follow.                         Patient Goals and CMS Choice            Expected Discharge Plan and Services                                              Prior Living Arrangements/Services                       Activities of Daily Living Home Assistive Devices/Equipment: None ADL Screening (condition at time of admission) Patient's cognitive ability adequate to safely complete daily activities?: Yes Is the patient deaf or have difficulty hearing?: No Does the patient have difficulty seeing, even when wearing glasses/contacts?: No Does the patient have difficulty concentrating, remembering, or making decisions?: No Patient able to express need for assistance with ADLs?: Yes Does the patient have difficulty dressing or bathing?: No Independently performs ADLs?: Yes (appropriate for developmental age) Does the patient have difficulty walking or climbing stairs?: No Weakness of Legs: None Weakness of Arms/Hands: None  Permission Sought/Granted                  Emotional Assessment              Admission diagnosis:  SOB (shortness of breath) [R06.02] General weakness [R53.1] Symptomatic anemia [D64.9] Patient Active Problem List   Diagnosis Date Noted   Hypokalemia 03/22/2023   Symptomatic anemia 03/21/2023   Pulmonary emboli (HCC) 12/07/2022   Cellulitis of right leg 01/08/2021   Dehydration 01/29/2020   Shortness of breath 01/02/2020   Congestive heart failure with left ventricular diastolic dysfunction, acute (HCC) 06/03/2019   Acute on chronic diastolic CHF (congestive heart failure) (HCC)  06/03/2019   GERD (gastroesophageal reflux disease) 06/11/2018   Tachy-brady syndrome (HCC) 05/11/2015   Severe obesity (BMI >= 40) (HCC) 05/03/2015   Essential hypertension 02/03/2015   Dyspnea 02/02/2015   Chronic diastolic heart failure (HCC) 01/04/2015   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 [place] [address] Phone: [phone] Fax: [phone]  [facility] - [place], [place] - [address] [place] [address] Phone: [phone] Fax: [phone]     Social Determinants of Health (SDOH) Social History: SDOH Screenings   Food Insecurity: No Food Insecurity (03/21/2023)  Housing: Low Risk  (03/21/2023)  Transportation Needs: No Transportation Needs (03/21/2023)  Utilities: Not At Risk (03/21/2023)  Depression (PHQ2-9): Low Risk  (05/25/2022)  Financial Resource Strain: Low Risk  (01/19/2022)  Stress: No Stress Concern Present (01/19/2022)  Tobacco Use:  Medium Risk (03/23/2023)   SDOH Interventions:     Readmission Risk Interventions     No data to display

## 2023-03-23 NOTE — Plan of Care (Signed)
Monitor labs and vital signs. NPO for testing. Monitor output.

## 2023-03-23 NOTE — Consult Note (Addendum)
[name] [birth year]  [id number].    Requesting MD: Dr. 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  She admits to some central abdominal pain as well as some dark, tarry stool that have been present for about the last month.  She has noted a change in her stools being more loose over the last month as well.  She has been able to eat with no nausea or vomiting, but has had a poor appetite.  She last had a colonoscopy over 20 years ago.  She admits to over 100# of weight loss, but some of this has been intentional, but not all that she has lost recently.  She really doesn't know how much she has lost recently.  Upon arrival to the ED she was noted to have a hgb of 7.1.  GI was consulted and originally had planned for an upper endo given her dark stools; however, a CT scan was ordered and she was noted to have an ascending colon mass.  Therefore, a colonoscopy was added on.  Her EGD was normal except one gastric polyp.  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  This was biopsied and tattooed.  She has a CEA pending as well as a CT chest to complete staging. We have been asked to see for further evaluation and recommendations.  ROS: ROS see HPI. Her granddaughter lives with her.  She is not driving right now.  She uses a walker with a seat on it.  She walked one lap around the unit.  If she goes to the grocery store, she uses the electric scooter while shopping.  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   rxd 3 yrs meds, no surgery in kidneys    Past Surgical History:  Procedure Laterality Date   ABDOMINAL HYSTERECTOMY     BREAST BIOPSY  04/30/2012   Procedure: BREAST BIOPSY WITH NEEDLE LOCALIZATION;  Surgeon: Emelia Loron, MD;  Location: [facility] OR;  Service: General;  Laterality: Left;  Left breast wire localization biospy   HEMORRHOID SURGERY     PACEMAKER INSERTION  02-06-2011   PPM GENERATOR CHANGEOUT N/A 04/19/2020   Procedure: PPM GENERATOR CHANGEOUT;  Surgeon: Marinus Maw, MD;  Location: [facility] IN[facility]AS[facility];  Service: Cardiovascular;  Laterality: N/A;   TAH and BSO     TONSILLECTOMY      Social History:  reports that she quit smoking about 45 years ago. Her  smoking use included cigarettes. She started smoking about 49 years ago. She has a 4 pack-year smoking history. She has never used smokeless tobacco. She reports that she does not drink alcohol and does not use drugs.  Allergies:  Allergies  Allergen Reactions   Albuterol Palpitations and Other (See Comments)    Irregular heartbeat      Medications Prior to Admission  Medication Sig Dispense Refill   apixaban (ELIQUIS) 5 MG TABS tablet Take 1 tablet (5 mg total) by mouth 2 (two) times daily. Start this once you are done with starter pack (Patient taking differently: Take 5 mg by mouth 2 (two) times daily.) 180 tablet 1   atorvastatin (LIPITOR) 20 MG tablet Take 1 tablet (20 mg total) by mouth daily. SCHEDULE APPT FOR FUTURE REFILLS (Patient taking differently: Take 20 mg by mouth daily.) 90 tablet 1   budesonide-formoterol (SYMBICORT)  80-4.5 MCG/ACT inhaler INHALE 2 PUFFS INTO THE LUNGS 2 (TWO) TIMES DAILY. (Patient taking differently: Inhale 2 puffs into the lungs 2 (two) times daily.) 3 each 0   levalbuterol (XOPENEX HFA) 45 MCG/ACT inhaler Inhale 2 puffs into the lungs every 4 (four) hours as needed for wheezing. 3 Inhaler 1   montelukast (SINGULAIR) 10 MG tablet Take 1 tablet (10 mg total) by mouth at bedtime. 90 tablet 1   omeprazole (PRILOSEC) 20 MG capsule TAKE 1 CAPSULE EVERY DAY 30 TO 60 MINUTES BEFORE FIRST MEAL OF THE DAY (Patient taking differently: Take 20 mg by mouth daily.) 90 capsule 1   nystatin (MYCOSTATIN/NYSTOP) powder Apply 1 Application topically 3 (three) times daily. (Patient not taking: Reported on 03/21/2023) 60 g 3     Physical Exam: Blood pressure 132/66, pulse 83, temperature (!) 97 F (36.1 C), temperature source Temporal, resp. rate (!) 21, height 5\' 6"  (1.676 m), weight 85.7 kg, SpO2 99%. General: pleasant, WD, moderately obese, elderly black female who is laying in bed in NAD HEENT: head is normocephalic, atraumatic.  Sclera are noninjected.  PERRL.  Ears and nose without any masses or lesions.  Mouth is pink and moist Heart: regular, rate, and rhythm.  Pacemaker noted in LUC.  Normal s1,s2. No obvious murmurs, gallops, or rubs noted.   Lungs: CTAB, no wheezes, rhonchi, or rales noted.  Respiratory effort nonlabored Abd: soft, minimal tenderness in central abdomen.  She has a small reducible umbilical hernia.  Just superior to this you can feel a mass that is mildly tender.  This coincides with the mass seen on her CT scan, ND, +BS, no organomegaly Psych: A&Ox3 with an appropriate affect.   Results for orders placed or performed during the hospital encounter of 03/20/23 (from the past 48 hour(s))  Magnesium     Status: Abnormal   Collection Time: 03/22/23  4:13 AM  Result Value Ref Range   Magnesium 1.6 (L) 1.7 - 2.4 mg/dL    Comment: Performed at [facility], [address], [place], [place] [address]  CBC     Status: Abnormal   Collection Time: 03/22/23  4:14 AM  Result Value Ref Range   WBC 5.4 4.0 - 10.5 K/uL   RBC 4.37 3.87 - 5.11 MIL/uL   Hemoglobin 9.5 (L) 12.0 - 15.0 g/dL   HCT 13.2 (L) 44.0 - 10.2 %   MCV 72.5 (L) 80.0 - 100.0 fL   MCH 21.7 (L) 26.0 - 34.0 pg   MCHC 30.0 30.0 - 36.0 g/dL   RDW 72.5 (H) 36.6 - 44.0 %  Platelets 200 150 - 400 K/uL    Comment: REPEATED TO VERIFY   nRBC 0.0 0.0 - 0.2 %    Comment: Performed at [facility], [address] [place], [place] [address]  Comprehensive metabolic panel     Status: Abnormal   Collection Time: 03/22/23  4:14 AM  Result Value Ref Range   Sodium 138 135 - 145 mmol/L   Potassium 3.3 (L) 3.5 - 5.1 mmol/L   Chloride 113 (H) 98 - 111 mmol/L   CO2 19 (L) 22 - 32 mmol/L   Glucose, Bld 89 70 - 99 mg/dL    Comment: Glucose reference range applies only to samples taken after fasting for at least 8 hours.   BUN 7 (L) 8 - 23 mg/dL   Creatinine, Ser 6.04 0.44 - 1.00 mg/dL   Calcium 7.1 (L) 8.9 - 10.3 mg/dL   Total Protein 4.5 (L) 6.5 - 8.1 g/dL   Albumin 2.2 (L) 3.5 - 5.0 g/dL   AST 24 15 - 41 U/L   ALT 14 0 - 44 U/L   Alkaline Phosphatase 80 38 - 126 U/L   Total Bilirubin 1.4 (H) 0.3 - 1.2 mg/dL   GFR, Estimated >54 >09 mL/min    Comment: (NOTE) Calculated using the CKD-EPI Creatinine Equation (2021)    Anion gap 6 5 - 15    Comment: Performed at [facility], [address], [place], [place] [address]  Protime-INR     Status: Abnormal   Collection Time: 03/22/23  4:14 AM  Result Value Ref Range   Prothrombin Time 20.7 (H) 11.4 - 15.2 seconds   INR 1.8 (H) 0.8 - 1.2    Comment: (NOTE) INR goal varies based on device and disease states. Performed at [facility], [address], [place], [place] [address]    CT ABDOMEN PELVIS W CONTRAST  Result Date: 03/21/2023 CLINICAL DATA:  Anemia.  Lower abdominal pain. EXAM: CT  ABDOMEN AND PELVIS WITH CONTRAST TECHNIQUE: Multidetector CT imaging of the abdomen and pelvis was performed using the standard protocol following bolus administration of intravenous contrast. RADIATION DOSE REDUCTION: This exam was performed according to the departmental dose-optimization program which includes automated exposure control, adjustment of the mA and/or kV according to patient size and/or use of iterative reconstruction technique. CONTRAST:  OMNIPAQUE IOHEXOL 300 MG/ML  SOLN COMPARISON:  CT abdomen pelvis dated 11/23/2020. FINDINGS: Lower chest: Left lung base atelectasis/scarring. Cardiac pacemaker wires noted. No intra-abdominal free air or free fluid. Hepatobiliary: The liver is unremarkable. There are multiple stones within the gallbladder. There is minimal haziness of the fat planes chest into the gallbladder. Ultrasound may provide better evaluation if there is clinical concern for acute cholecystitis. Pancreas: Unremarkable. No pancreatic ductal dilatation or surrounding inflammatory changes. Spleen: Normal in size without focal abnormality. Adrenals/Urinary Tract: The adrenal glands unremarkable. Similar appearance of a cluster of rim calcified cystic lesions arising from the upper pole of the right kidney. There is no hydronephrosis on either side. There is symmetric enhancement and excretion of contrast by both kidneys. The visualized ureters appear unremarkable. The urinary bladder is collapsed. Stomach/Bowel: There is sigmoid diverticulosis. There is intussusception of the distal small bowel into the colon extending to the level of the proximal transverse colon. There is a somewhat lobulated mass involving the proximal ascending colon measuring approximately 5 x 9 cm in greatest axial dimensions and functioning as a lead point and consistent with malignancy. Further evaluation with colonoscopy  is recommended. There is no bowel obstruction. The appendix is normal. Vascular/Lymphatic:  The abdominal aorta and IVC unremarkable. No portal venous gas. Several small rounded lymph node adjacent to the intussusception in the right lower quadrant suspicious for metastatic nodal involvement. These measure up to 7 mm. Reproductive: Hysterectomy.  No adnexal masses. Other: Small fat containing umbilical and supraumbilical hernia. No bowel herniation or fluid collection. Musculoskeletal: Osteopenia with degenerative changes. No acute osseous pathology. IMPRESSION: 1. 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 Further evaluation with colonoscopy is recommended. 2. Intussusception of the distal small bowel into the colon extending to the level of the proximal transverse colon. No bowel obstruction. 3. Sigmoid diverticulosis. 4. Cholelithiasis. Electronically Signed   By: Elgie Collard M.D.   On: 03/21/2023 21:23      Assessment/Plan Near obstructing right/transverse colon mass, suspicious for malignancy The patient has been seen, examined, labs, vitals, chart, and imaging personally reviewed.  She appears to have a near obstructing colon mass that is highly likely to be malignant.  CEA pending as well as CT chest for complete staging work up.  We would recommend continuing to hold her Eliquis as is already being done.  Given her cardiac history, we would recommend a pre-operative cardiac evaluation for peri-operative risk stratification.  She is on a FLD, and we will add Resource breeze (lactose intolerance) to help with nutritional intake as her albumin is low already, not unsurprisingly given lack of appetite and weight loss.  We have discussed the need for surgical intervention during her hospital stay given the near obstructed and bleeding nature of this tumor.  This will likely be early next week.  2 of her sons were on the phone while I was in the room.  All of this was dicussed with them and they understand and agree with plan moving forward as does the patient.  D/w GI, primary,  and cardiology.  (Cards to see tomorrow)   FEN - FLD/Breeze/SLIV VTE - Eliquis on hold ID - none currently needed  A. 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  Letha Cape, [facility] 03/23/2023, 2:01 PM Please see Amion for pager number during day hours 7:00am-4:30pm or 7:00am -11:30am on weekends

## 2023-03-23 NOTE — TOC CM/SW Note (Signed)
Transition of Care [facility]) - Inpatient Brief Assessment   Patient Details  Name: [name] MRN: [id number] Date of Birth: 1940/09/14  [facility] CM/SW Contact:    Howell Rucks, RN Phone Number: 03/23/2023, 3:28 PM   Clinical Narrative: Met with pt at bedside to introduce role of TOC/NCM and review for dc planning, pt reports she has a PCP and pharmacy in place, no current home care services, reports she has good family support and feels safe returning home. Patient reports she has all DME including a walker and cane, confirms family will provide transport at discharge. TOC Brief Assessment completed. No TOC needs identified.     Transition of Care Asessment: Insurance and Status: Insurance coverage has been reviewed Patient has primary care physician: Yes Home environment has been reviewed: resides with relatives Prior level of function:: Independent with walker/cane as needed Prior/Current Home Services: No current home services Social Determinants of Health Reivew: SDOH reviewed no interventions necessary Readmission risk has been reviewed: Yes Transition of care needs: no transition of care needs at this time

## 2023-03-24 ENCOUNTER — Inpatient Hospital Stay (HOSPITAL_COMMUNITY): Payer: Medicare HMO

## 2023-03-24 ENCOUNTER — Encounter (HOSPITAL_COMMUNITY): Payer: Self-pay | Admitting: Family Medicine

## 2023-03-24 DIAGNOSIS — Z01810 Encounter for preprocedural cardiovascular examination: Secondary | ICD-10-CM

## 2023-03-24 DIAGNOSIS — D649 Anemia, unspecified: Secondary | ICD-10-CM | POA: Diagnosis not present

## 2023-03-24 LAB — COMPREHENSIVE METABOLIC PANEL
ALT: 16 U/L (ref 0–44)
AST: 28 U/L (ref 15–41)
Albumin: 2.2 g/dL — ABNORMAL LOW (ref 3.5–5.0)
Alkaline Phosphatase: 86 U/L (ref 38–126)
Anion gap: 4 — ABNORMAL LOW (ref 5–15)
BUN: <5 mg/dL — ABNORMAL LOW (ref 8–23)
CO2: 18 mmol/L — ABNORMAL LOW (ref 22–32)
Calcium: 7.8 mg/dL — ABNORMAL LOW (ref 8.9–10.3)
Chloride: 119 mmol/L — ABNORMAL HIGH (ref 98–111)
Creatinine, Ser: 0.82 mg/dL (ref 0.44–1.00)
GFR, Estimated: >60 mL/min (ref >60)
Glucose, Bld: 76 mg/dL (ref 70–99)
Potassium: 4.2 mmol/L (ref 3.5–5.1)
Sodium: 141 mmol/L (ref 135–145)
Total Bilirubin: 1.1 mg/dL (ref 0.3–1.2)
Total Protein: 4.6 g/dL — ABNORMAL LOW (ref 6.5–8.1)

## 2023-03-24 LAB — CBC WITH DIFFERENTIAL/PLATELET
Abs Immature Granulocytes: 0.01 10*3/uL (ref 0.00–0.07)
Basophils Absolute: 0 10*3/uL (ref 0.0–0.1)
Basophils Relative: 1 %
Eosinophils Absolute: 0.3 10*3/uL (ref 0.0–0.5)
Eosinophils Relative: 5 %
HCT: 29.2 % — ABNORMAL LOW (ref 36.0–46.0)
Hemoglobin: 8.8 g/dL — ABNORMAL LOW (ref 12.0–15.0)
Immature Granulocytes: 0 %
Lymphocytes Relative: 21 %
Lymphs Abs: 1.1 10*3/uL (ref 0.7–4.0)
MCH: 21.9 pg — ABNORMAL LOW (ref 26.0–34.0)
MCHC: 30.1 g/dL (ref 30.0–36.0)
MCV: 72.8 fL — ABNORMAL LOW (ref 80.0–100.0)
Monocytes Absolute: 0.6 10*3/uL (ref 0.1–1.0)
Monocytes Relative: 11 %
Neutro Abs: 3.3 10*3/uL (ref 1.7–7.7)
Neutrophils Relative %: 62 %
Platelets: 191 10*3/uL (ref 150–400)
RBC: 4.01 MIL/uL (ref 3.87–5.11)
RDW: 27.4 % — ABNORMAL HIGH (ref 11.5–15.5)
WBC: 5.3 10*3/uL (ref 4.0–10.5)
nRBC: 0.4 % — ABNORMAL HIGH (ref 0.0–0.2)

## 2023-03-24 LAB — PHOSPHORUS: Phosphorus: 2.6 mg/dL (ref 2.5–4.6)

## 2023-03-24 LAB — MAGNESIUM: Magnesium: 2.5 mg/dL — ABNORMAL HIGH (ref 1.7–2.4)

## 2023-03-24 LAB — CEA: CEA: 32.2 ng/mL — ABNORMAL HIGH (ref 0.0–4.7)

## 2023-03-24 NOTE — Consult Note (Signed)
Cardiology Consultation   Patient ID: [name] MRN: [id number]; DOB: Feb 20, 1941  Admit date: 03/20/2023 Date of Consult: 03/24/2023  PCP:  Philip Aspen, Limmie Patricia, MD   [facility] Providers Cardiologist:  Lewayne Bunting, MD  Electrophysiologist:  Lewayne Bunting, MD  {    Patient Profile:   [name] is a 82 y.o. female with a hx of anemia, arthritis, asthma, COPD, HLD, HTN, atach, PAF, chronic HFpEF, venous insufficiency and chronic LE edema, pacemaker,  PE 12/2022 who is being seen 03/24/2023 for the evaluation of preop evaluation at the request of Dr Lanae Boast.  History of Present Illness:   Ms. Justus 82 yo female history of anemia, arthritis, asthma, COPD, HLD, HTN, atach, PAF, chronic HFpEF, venous insufficiency and chronic LE edema, pacemaker,  PE 12/2022, admitted 03/20/23 with SOB and melena. Found to have colon mass concenring for cancer with plans for hemicolectomy per surgery. Cardiology consulted for cardiac preoperative evaluation  Prior to recent symptoms anemia, up until just a few weeks ago remained very physically active. She has 15 stairs at home that she would walk up without significant chest pains and just mild SOB at the top, would not have to stop. Reports her chronic LE edema is stable.    Admit labs K 2.9 Cr 0.9 BUN 10 WBC 3.9 Hgb 7.1 Plt 229 FOBT + Ferritin 7 Trop 13-->14 EKG AV paced CXR no acute process CT A/P: proximal ascending colon mass consistent with malignancy,   12/2022 echo: LVEF 55-60%, no WMAs, grade I dd, normal RV 05/2019 nuclear stress: no ishcemia   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   rxd  3 yrs meds, no surgery in kidneys    Past Surgical History:  Procedure Laterality Date   ABDOMINAL HYSTERECTOMY     BREAST BIOPSY  04/30/2012   Procedure: BREAST BIOPSY WITH NEEDLE LOCALIZATION;  Surgeon: Emelia Loron, MD;  Location: [facility] OR;  Service: General;  Laterality: Left;  Left breast wire localization biospy   HEMORRHOID SURGERY     PACEMAKER INSERTION  02-06-2011   PPM GENERATOR CHANGEOUT N/A 04/19/2020   Procedure: PPM GENERATOR CHANGEOUT;  Surgeon: Marinus Maw, MD;  Location: [facility] INVASIVE CV LAB;  Service: Cardiovascular;  Laterality: N/A;   TAH and BSO     TONSILLECTOMY       Inpatient Medications: Scheduled Meds:  atorvastatin  20 mg Oral Daily   feeding supplement  1 Container Oral TID BM   mometasone-formoterol  2 puff Inhalation BID   montelukast  10 mg Oral QHS   pantoprazole (PROTONIX) IV  40 mg Intravenous Q12H   sodium chloride flush  3 mL Intravenous Q12H   Continuous Infusions:  PRN Meds: acetaminophen **OR** acetaminophen, bisacodyl, hydrALAZINE, HYDROcodone-acetaminophen, ipratropium, levalbuterol, morphine injection, ondansetron **OR** ondansetron (ZOFRAN) IV, senna-docusate, traZODone  Allergies:    Allergies  Allergen Reactions   Albuterol Palpitations and Other (See Comments)    Irregular heartbeat      Social History:   Social History  Socioeconomic History   Marital status: Divorced    Spouse name: Not on file   Number of children: 2   Years of education: Not on file   Highest education level: Not on file  Occupational History   Occupation: cna  Tobacco Use   Smoking status: Former    Current packs/day: 0.00    Average packs/day: 1 pack/day for 4.0 years (4.0 ttl pk-yrs)    Types: Cigarettes    Start date: 08/07/1973    Quit date: 08/07/1977    Years since quitting: 45.6   Smokeless tobacco: Never  Vaping Use   Vaping status: Never Used  Substance and Sexual Activity   Alcohol use: No    Alcohol/week: 0.0 standard drinks of  alcohol    Comment: quit 1979   Drug use: No   Sexual activity: Not on file  Other Topics Concern   Not on file  Social History Narrative   Pt has 11 siblings in all   Social Determinants of Health   Financial Resource Strain: Low Risk  (01/19/2022)   Overall Financial Resource Strain (CARDIA)    Difficulty of Paying Living Expenses: Not hard at all  Food Insecurity: No Food Insecurity (03/21/2023)   Hunger Vital Sign    Worried About Running Out of Food in the Last Year: Never true    Ran Out of Food in the Last Year: Never true  Transportation Needs: No Transportation Needs (03/21/2023)   PRAPARE - Administrator, Civil Service (Medical): No    Lack of Transportation (Non-Medical): No  Physical Activity: Not on file  Stress: No Stress Concern Present (01/19/2022)   [employer] of Occupational Health - Occupational Stress Questionnaire    Feeling of Stress : Not at all  Social Connections: Not on file  Intimate Partner Violence: Not At Risk (03/21/2023)   Humiliation, Afraid, Rape, and Kick questionnaire    Fear of Current or Ex-Partner: No    Emotionally Abused: No    Physically Abused: No    Sexually Abused: No    Family History:    Family History  Problem Relation Age of Onset   Heart disease Mother    Cancer Sister        breast     ROS:  Please see the history of present illness.   All other ROS reviewed and negative.     Physical Exam/Data:   Vitals:   03/23/23 2051 03/23/23 2238 03/24/23 0506 03/24/23 0734  BP: 118/62  (!) 91/44   Pulse: 79  74   Resp:  14 18   Temp: 97.8 F (36.6 C)  (!) 97.5 F (36.4 C)   TempSrc: Oral     SpO2: 99%  99% 98%  Weight:      Height:        Intake/Output Summary (Last 24 hours) at 03/24/2023 1046 Last data filed at 03/24/2023 0917 Gross per 24 hour  Intake 1140 ml  Output --  Net 1140 ml      03/20/2023    9:42 PM 02/07/2023   11:08 AM 12/12/2022   11:19 AM  Last 3 Weights  Weight (lbs) 189 lb  209 lb 219 lb 9.6 oz  Weight (kg) 85.73 kg 94.802 kg 99.61 kg     Body mass index is 30.51 kg/m.  General:  Well nourished, well developed, in no acute distress HEENT: normal Neck: no JVD Vascular: No carotid bruits; Distal pulses 2+ bilaterally Cardiac:  normal S1, S2;  RRR; no murmur  Lungs:  clear to auscultation bilaterally, no wheezing, rhonchi or rales  Abd: soft, nontender, no hepatomegaly  Ext: 1+ bilateral nonpitting edema Musculoskeletal:  No deformities, BUE and BLE strength normal and equal Skin: warm and dry  Neuro:  CNs 2-12 intact, no focal abnormalities noted Psych:  Normal affect    Laboratory Data:  High Sensitivity Troponin:   Recent Labs  Lab 03/20/23 2316 03/21/23 0030  TROPONINIHS 13 14     Chemistry Recent Labs  Lab 03/21/23 1212 03/22/23 0413 03/22/23 0414 03/24/23 0422  NA 137  --  138 141  K 3.0*  --  3.3* 4.2  CL 108  --  113* 119*  CO2 19*  --  19* 18*  GLUCOSE 103*  --  89 76  BUN 10  --  7* <5*  CREATININE 0.76  --  0.73 0.82  CALCIUM 8.1*  --  7.1* 7.8*  MG 1.7 1.6*  --  2.5*  GFRNONAA >60  --  >60 >60  ANIONGAP 10  --  6 4*    Recent Labs  Lab 03/22/23 0414 03/24/23 0422  PROT 4.5* 4.6*  ALBUMIN 2.2* 2.2*  AST 24 28  ALT 14 16  ALKPHOS 80 86  BILITOT 1.4* 1.1   Lipids No results for input(s): "CHOL", "TRIG", "HDL", "LABVLDL", "LDLCALC", "CHOLHDL" in the last 168 hours.  Hematology Recent Labs  Lab 03/21/23 1212 03/22/23 0414 03/24/23 0422  WBC 6.6 5.4 5.3  RBC 4.98 4.37 4.01  HGB 10.8* 9.5* 8.8*  HCT 36.5 31.7* 29.2*  MCV 73.3* 72.5* 72.8*  MCH 21.7* 21.7* 21.9*  MCHC 29.6* 30.0 30.1  RDW 26.0* 25.9* 27.4*  PLT 218 200 191   Thyroid No results for input(s): "TSH", "FREET4" in the last 168 hours.  BNPNo results for input(s): "BNP", "PROBNP" in the last 168 hours.  DDimer No results for input(s): "DDIMER" in the last 168 hours.   Radiology/Studies:  CT ABDOMEN PELVIS W CONTRAST  Result Date:  03/21/2023 CLINICAL DATA:  Anemia.  Lower abdominal pain. EXAM: CT ABDOMEN AND PELVIS WITH CONTRAST TECHNIQUE: Multidetector CT imaging of the abdomen and pelvis was performed using the standard protocol following bolus administration of intravenous contrast. RADIATION DOSE REDUCTION: This exam was performed according to the departmental dose-optimization program which includes automated exposure control, adjustment of the mA and/or kV according to patient size and/or use of iterative reconstruction technique. CONTRAST:  OMNIPAQUE IOHEXOL 300 MG/ML  SOLN COMPARISON:  CT abdomen pelvis dated 11/23/2020. FINDINGS: Lower chest: Left lung base atelectasis/scarring. Cardiac pacemaker wires noted. No intra-abdominal free air or free fluid. Hepatobiliary: The liver is unremarkable. There are multiple stones within the gallbladder. There is minimal haziness of the fat planes chest into the gallbladder. Ultrasound may provide better evaluation if there is clinical concern for acute cholecystitis. Pancreas: Unremarkable. No pancreatic ductal dilatation or surrounding inflammatory changes. Spleen: Normal in size without focal abnormality. Adrenals/Urinary Tract: The adrenal glands unremarkable. Similar appearance of a cluster of rim calcified cystic lesions arising from the upper pole of the right kidney. There is no hydronephrosis on either side. There is symmetric enhancement and excretion of contrast by both kidneys. The visualized ureters appear unremarkable. The urinary bladder is collapsed. Stomach/Bowel: There is sigmoid diverticulosis. There is intussusception of the distal small bowel into the colon extending to the level of the proximal transverse colon. There is a somewhat lobulated mass involving the proximal ascending colon measuring approximately 5 x 9 cm in  greatest axial dimensions and functioning as a lead point and consistent with malignancy. Further evaluation with colonoscopy is recommended. There is  no bowel obstruction. The appendix is normal. Vascular/Lymphatic: The abdominal aorta and IVC unremarkable. No portal venous gas. Several small rounded lymph node adjacent to the intussusception in the right lower quadrant suspicious for metastatic nodal involvement. These measure up to 7 mm. Reproductive: Hysterectomy.  No adnexal masses. Other: Small fat containing umbilical and supraumbilical hernia. No bowel herniation or fluid collection. Musculoskeletal: Osteopenia with degenerative changes. No acute osseous pathology. IMPRESSION: 1. 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 Further evaluation with colonoscopy is recommended. 2. Intussusception of the distal small bowel into the colon extending to the level of the proximal transverse colon. No bowel obstruction. 3. Sigmoid diverticulosis. 4. Cholelithiasis. Electronically Signed   By: Elgie Collard M.D.   On: 03/21/2023 21:23   DG Chest 2 View  Result Date: 03/20/2023 CLINICAL DATA:  Shortness of breath EXAM: CHEST - 2 VIEW COMPARISON:  12/07/2022 FINDINGS: Lungs are clear.  No pleural effusion or pneumothorax. The heart is normal in size.  Left subclavian pacemaker. Degenerative changes of the visualized thoracolumbar spine. IMPRESSION: Normal chest radiographs. Electronically Signed   By: Charline Bills M.D.   On: 03/20/2023 22:48     Assessment and Plan:   1.GI bleed - FOBT +, Hgb 7.1 on admission and ferritin 7.  - transfused 2 units - CT with colon mass consistent with cancer,  - colonoscopy large fungating polypoid sessile and ulcerated nearly completely obstructing mass proximal transverse colon.  - seen by surgery, considering right hemicolectomy   2.Chronic HFpEF - chronic LE edema due to venous insufficiency, historically poor gauge for her volume status.  - lungs clear, no JVD. Appears euvolemic, can add on BNP to AM labs  3. PAF - EKG shows AV paced -anticoag on hold due to GI bleed  4.  Pacemaker   5. History of PE - 12/2022 small PE - anticoag on hold due to GI bleed  6. Preoperative evaluation - no active acute cardiac conditions.  - recent echo was benign other than mild diastolic dysfunction. Nuclear stress 2020 no ischemia.  - up until just a few weeks ago was tolerating greater than 4 METs - clearly important urgent surgery with colon mass likely cancer - recommend proceeding with surgery as planned, no indication for additional cardiac testing or interventions. I think she is euvolemic, she has chronic LE edema from venous insufficiency. Can f/u CT chest she had for cancer staging and BNP added for AM labs  No additional cardiology recs, will f/u CT and labs tomorrow.     For questions or updates, please contact [facility] Please consult [email or web address] for contact info under    Signed, Dina Rich, MD  03/24/2023 10:46 AM

## 2023-03-24 NOTE — Plan of Care (Signed)
  Problem: Education: Goal: Knowledge of General Education information will improve Description Including pain rating scale, medication(s)/side effects and non-pharmacologic comfort measures Outcome: Progressing   

## 2023-03-24 NOTE — Progress Notes (Signed)
PROGRESS NOTE [name]  EXB:[id number] DOB: [birth year] DOA: 03/20/2023 PCP: Philip Aspen, Limmie Patricia, MD  Brief Narrative/Hospital Course: 82 y.o. female with a known history of COPD, GERD, hypertension, hyperlipidemia and recent history of pulmonary embolism 12/2022, paroxysmal A-fib status post pacemaker presented to the ER with shortness of breath, dyspnea on exertion and wheezing along with reports of dark sticky stools.   In the ED: anemia w/ hemoglobin 7.1,admitted with acute GI bleeding, received 2 units of PRBC.  Patient was on Coumadin for paroxysmal A-fib with irregular dosing, she has been on Eliquis since the diagnosis. 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 Underwent EGD colonoscopy and found to have nearly obstructive colonic mass hemoglobin improving with 2 unit PRBC.  Surgery has been consulted-plan for exploratory laparotomy/extended right hemicolectomy on 8/19    Subjective: Patient seen and examined this morning Alert awake on room air BP was soft earlier this morning Labs with stable sodium potassium, creatinine.  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  Hemoglobin up but slightly downtrending trend and transfuse as needed.  EGD unremarkable, colonoscopy showed colon mass. Recent Labs    12/08/22 0430 12/09/22 0351 03/20/23 2316 03/21/23 1212 03/22/23 0414 03/24/23 0422  HGB 9.1* 9.2* 7.1* 10.8* 9.5* 8.8*  MCV 78.2* 79.9* 68.1* 73.3* 72.5* 72.8*  VITAMINB12  --   --   --  906  --   --   FOLATE  --   --   --  10.2  --   --   FERRITIN 8*   --   --  7*  --   --   TIBC 363  --   --  314  --   --   IRON 20*  --   --  19*  --   --     Colonic mass in the transverse colon nearly completely obstructing: Concerning for malignancy planning for exploratory laparotomy/extended right hemicolectomy on 8/19.  Appreciate surgical input.  Hypokalemia and hypomagnesemia: Resolved Recent Labs  Lab 03/20/23 2316 03/21/23 1212 03/22/23 0413 03/22/23 0414 03/24/23 0422  K 2.9* 3.0*  --  3.3* 4.2  CALCIUM 7.8* 8.1*  --  7.1* 7.8*  MG  --  1.7 1.6*  --  2.5*  PHOS  --   --   --   --  2.6    Metabolic acidosis: monitor bicarb/BMP  History of pulmonary embolism,recently diagnosed: Holding anticoagulation due to active bleeding.PE was subsegmental.  Currently no indication for bridging with heparin.  Monitor and resume once okay with surgery   Tachybradycardia syndrome status post pacemaker Chronic diastolic heart failure Soft BP: Euvolemic.  Monitor volume status.  Cardiology has been consulted for preop evaluation Net IO Since Admission: 5,841.27 mL [03/24/23 1316]    GERD: on Protonix  Class I Obesity Body mass index is 30.51 kg/m. : Will benefit with PCP follow-up, weight loss  healthy lifestyle and outpatient sleep evaluation.   DVT prophylaxis: SCDs Start: 03/21/23 0911 Code Status:   Code Status: Full Code Family Communication: plan of care discussed with patient at bedside. Patient status  is: Inpatient because of colonic mass Level of care: Telemetry   Dispo: The patient is from: Home            Anticipated disposition: TBD pending surgical intervention of colonic mass Objective: Vitals last 24 hrs: Vitals:   03/23/23 2238 03/24/23 0506 03/24/23 0734 03/24/23 1245  BP:  (!) 91/44  105/69  Pulse:  74  72  Resp: 14 18  15   Temp:  (!) 97.5 F (36.4 C)  97.9 F (36.6 C)  TempSrc:    Oral  SpO2:  99% 98% 100%  Weight:      Height:       Weight change:   Physical Examination: General exam: alert awake, older  than stated age HEENT:Oral mucosa moist, Ear/Nose WNL grossly Respiratory system: bilaterally clear BS, no use of accessory muscle Cardiovascular system: S1 & S2 +, No JVD. Gastrointestinal system: Abdomen soft,NT,ND, BS+ Nervous System:Alert, awake, moving extremities. Extremities: LE edema mild,distal peripheral pulses palpable.  Skin: No rashes,no icterus. MSK: Normal muscle bulk,tone, power  Medications reviewed:  Scheduled Meds:  atorvastatin  20 mg Oral Daily   feeding supplement  1 Container Oral TID BM   mometasone-formoterol  2 puff Inhalation BID   montelukast  10 mg Oral QHS   pantoprazole (PROTONIX) IV  40 mg Intravenous Q12H   sodium chloride flush  3 mL Intravenous Q12H   Continuous Infusions:    Diet Order             Diet full liquid Room service appropriate? Yes; Fluid consistency: Thin  Diet effective now                   Intake/Output Summary (Last 24 hours) at 03/24/2023 1316 Last data filed at 03/24/2023 0917 Gross per 24 hour  Intake 1140 ml  Output --  Net 1140 ml   Net IO Since Admission: 5,841.27 mL [03/24/23 1316]  Wt Readings from Last 3 Encounters:  03/20/23 85.7 kg  02/07/23 94.8 kg  12/12/22 99.6 kg     Unresulted Labs (From admission, onward)     Start     Ordered   03/25/23 0500  CBC  Tomorrow morning,   R        03/24/23 0850   03/25/23 0500  Basic metabolic panel  Tomorrow morning,   R        03/24/23 0850   03/25/23 0500  Protime-INR  Tomorrow morning,   R        03/24/23 0850   03/25/23 0500  Brain natriuretic peptide  Tomorrow morning,   R        03/24/23 1115   03/23/23 1358  CEA  Once,   R        03/23/23 1357   03/21/23 0911  Occult blood card to lab, stool  Once,   R        03/21/23 0910          Data Reviewed: I have personally reviewed following labs and imaging studies CBC: Recent Labs  Lab 03/20/23 2316 03/21/23 1212 03/22/23 0414 03/24/23 0422  WBC 3.9* 6.6 5.4 5.3  NEUTROABS 2.2  --   --  3.3   HGB 7.1* 10.8* 9.5* 8.8*  HCT 24.6* 36.5 31.7* 29.2*  MCV 68.1* 73.3* 72.5* 72.8*  PLT 229 218 200 191   Basic Metabolic Panel: Recent Labs  Lab 03/20/23 2316 03/21/23 1212 03/22/23 0413 03/22/23 0414 03/24/23 0422  NA 139 137  --  138 141  K 2.9* 3.0*  --  3.3* 4.2  CL 111 108  --  113* 119*  CO2 22 19*  --  19* 18*  GLUCOSE 87 103*  --  89 76  BUN 10 10  --  7* <5*  CREATININE 0.90 0.76  --  0.73 0.82  CALCIUM 7.8* 8.1*  --  7.1* 7.8*  MG  --  1.7 1.6*  --  2.5*  PHOS  --   --   --   --  2.6   GFR: Estimated Creatinine Clearance: 56.4 mL/min (by C-G formula based on SCr of 0.82 mg/dL). Liver Function Tests: Recent Labs  Lab 03/22/23 0414 03/24/23 0422  AST 24 28  ALT 14 16  ALKPHOS 80 86  BILITOT 1.4* 1.1  PROT 4.5* 4.6*  ALBUMIN 2.2* 2.2*   Recent Results (from the past 240 hour(s))  SARS Coronavirus 2 by RT PCR (hospital order, performed in [facility] hospital lab) *cepheid single result test* Anterior Nasal Swab     Status: None   Collection Time: 03/20/23 11:31 PM   Specimen: Anterior Nasal Swab  Result Value Ref Range Status   SARS Coronavirus 2 by RT PCR NEGATIVE NEGATIVE Final    Comment: (NOTE) SARS-CoV-2 target nucleic acids are NOT DETECTED.  The SARS-CoV-2 RNA is generally detectable in upper and lower respiratory specimens during the acute phase of infection. The lowest concentration of SARS-CoV-2 viral copies this assay can detect is 250 copies / mL. A negative result does not preclude SARS-CoV-2 infection and should not be used as the sole basis for treatment or other patient management decisions.  A negative result may occur with improper specimen collection / handling, submission of specimen other than nasopharyngeal swab, presence of viral mutation(s) within the areas targeted by this assay, and inadequate number of viral copies (<250 copies / mL). A negative result must be combined with clinical observations, patient history, and  epidemiological information.  Fact Sheet for Patients:   RoadLapTop.co.za  Fact Sheet for Healthcare Providers: http://kim-miller.com/  This test is not yet approved or  cleared by the [place] FDA and has been authorized for detection and/or diagnosis of SARS-CoV-2 by FDA under an Emergency Use Authorization (EUA).  This EUA will remain in effect (meaning this test can be used) for the duration of the COVID-19 declaration under Section 564(b)(1) of the Act, 21 U.S.C. section 360bbb-3(b)(1), unless the authorization is terminated or revoked sooner.  Performed at [facility], [address], [place], [place] [address]     Antimicrobials: Anti-infectives (From admission, onward)    None      Culture/Microbiology No results found for: "SDES", "SPECREQUEST", "CULT", "REPTSTATUS"   Radiology Studies: No results found.   LOS: 2 days   Lanae Boast, MD [facility]  03/24/2023, 1:16 PM

## 2023-03-24 NOTE — Progress Notes (Signed)
1 Day Post-Op   Subjective/Chief Complaint: No complaints Small bowel movements yesterday Hgb trending back down Cardiology consult pending for clearance  Objective: Vital signs in last 24 hours: Temp:  [97 F (36.1 C)-97.8 F (36.6 C)] 97.5 F (36.4 C) (08/17 0506) Pulse Rate:  [69-84] 74 (08/17 0506) Resp:  [14-25] 18 (08/17 0506) BP: (91-132)/(44-66) 91/44 (08/17 0506) SpO2:  [96 %-99 %] 98 % (08/17 0734) Last BM Date : 03/22/23  Intake/Output from previous day: 08/16 0701 - 08/17 0700 In: 1020 [P.O.:220; I.V.:800] Out: -  Intake/Output this shift: No intake/output data recorded.  General: pleasant, WD, moderately obese, elderly black female who is laying in bed in NAD HEENT: head is normocephalic, atraumatic.  Sclera are noninjected.  PERRL.  Ears and nose without any masses or lesions.  Mouth is pink and moist Heart: regular, rate, and rhythm.  Pacemaker noted in LUC.  Normal s1,s2. No obvious murmurs, gallops, or rubs noted.   Lungs: CTAB, no wheezes, rhonchi, or rales noted.  Respiratory effort nonlabored Abd: soft, minimal tenderness in central abdomen.  Midline incision well-healed.  She has a small reducible umbilical hernia.  Just superior to this you can feel a mass that is mildly tender.  This coincides with the mass seen on her CT scan, ND, +BS, no organomegaly Psych: A&Ox3 with an appropriate affect.  Lab Results:  Recent Labs    03/22/23 0414 03/24/23 0422  WBC 5.4 5.3  HGB 9.5* 8.8*  HCT 31.7* 29.2*  PLT 200 191   BMET Recent Labs    03/22/23 0414 03/24/23 0422  NA 138 141  K 3.3* 4.2  CL 113* 119*  CO2 19* 18*  GLUCOSE 89 76  BUN 7* <5*  CREATININE 0.73 0.82  CALCIUM 7.1* 7.8*   PT/INR Recent Labs    03/22/23 0414  LABPROT 20.7*  INR 1.8*      Assessment/Plan: Near obstructing right/transverse colon mass, suspicious for malignancy - biopsy pending CEA pending  CT chest for complete staging work up.   Hold Eliquis and any  long-acting anticoagulation Will recheck INR tomorrow to make sure it is normal. Do not advance past full liquids before surgery.  Need cardiology consult for pre-operative cardiac risk stratification. We would recommend continuing to hold her Eliquis as is already being done.  Given her cardiac history, we would recommend a pre-operative cardiac evaluation for peri-operative risk stratification.  Will tentatively plan exploratory laparotomy/ extended right hemicolectomy on Monday if cleared by Cardiology.     FEN - FLD/Breeze/SLIV VTE - Eliquis on hold ID - none currently needed   A. fib Arthritis Asthma COPD HTN HLD pacemaker secondary to tachy-brady syndrome Chronic diastolic HF with LF of 55-60% PE in 2024 on Eliquis Anemia Obesity GERD   LOS: 2 days    Wynona Luna 03/24/2023

## 2023-03-24 NOTE — Plan of Care (Signed)
Problem: Education: Goal: Knowledge of General Education information will improve Description Including pain rating scale, medication(s)/side effects and non-pharmacologic comfort measures Outcome: Progressing   Problem: Health Behavior/Discharge Planning: Goal: Ability to manage health-related needs will improve Outcome: Progressing   Problem: Activity: Goal: Risk for activity intolerance will decrease Outcome: Progressing   Problem: Nutrition: Goal: Adequate nutrition will be maintained Outcome: Progressing   Problem: Coping: Goal: Level of anxiety will decrease Outcome: Progressing   Problem: Elimination: Goal: Will not experience complications related to bowel motility Outcome: Progressing Goal: Will not experience complications related to urinary retention Outcome: Progressing   Problem: Pain Managment: Goal: General experience of comfort will improve Outcome: Progressing   Problem: Safety: Goal: Ability to remain free from injury will improve Outcome: Progressing   Problem: Skin Integrity: Goal: Risk for impaired skin integrity will decrease Outcome: Progressing   

## 2023-03-25 DIAGNOSIS — D649 Anemia, unspecified: Secondary | ICD-10-CM | POA: Diagnosis not present

## 2023-03-25 LAB — CBC
HCT: 30.1 % — ABNORMAL LOW (ref 36.0–46.0)
Hemoglobin: 9 g/dL — ABNORMAL LOW (ref 12.0–15.0)
MCH: 21.8 pg — ABNORMAL LOW (ref 26.0–34.0)
MCHC: 29.9 g/dL — ABNORMAL LOW (ref 30.0–36.0)
MCV: 72.9 fL — ABNORMAL LOW (ref 80.0–100.0)
Platelets: 185 10*3/uL (ref 150–400)
RBC: 4.13 MIL/uL (ref 3.87–5.11)
RDW: 27.9 % — ABNORMAL HIGH (ref 11.5–15.5)
WBC: 6.9 10*3/uL (ref 4.0–10.5)
nRBC: 0.4 % — ABNORMAL HIGH (ref 0.0–0.2)

## 2023-03-25 LAB — BASIC METABOLIC PANEL
Anion gap: 5 (ref 5–15)
BUN: 5 mg/dL — ABNORMAL LOW (ref 8–23)
CO2: 19 mmol/L — ABNORMAL LOW (ref 22–32)
Calcium: 7.9 mg/dL — ABNORMAL LOW (ref 8.9–10.3)
Chloride: 115 mmol/L — ABNORMAL HIGH (ref 98–111)
Creatinine, Ser: 0.84 mg/dL (ref 0.44–1.00)
GFR, Estimated: >60 mL/min (ref >60)
Glucose, Bld: 99 mg/dL (ref 70–99)
Potassium: 4.1 mmol/L (ref 3.5–5.1)
Sodium: 139 mmol/L (ref 135–145)

## 2023-03-25 LAB — PROTIME-INR
INR: 1.4 — ABNORMAL HIGH (ref 0.8–1.2)
Prothrombin Time: 17.5 s — ABNORMAL HIGH (ref 11.4–15.2)

## 2023-03-25 LAB — BRAIN NATRIURETIC PEPTIDE: B Natriuretic Peptide: 122.6 pg/mL — ABNORMAL HIGH (ref 0.0–100.0)

## 2023-03-25 MED ORDER — SODIUM CHLORIDE 0.9 % IV SOLN
2.0000 g | Freq: Once | INTRAVENOUS | Status: AC
  Administered 2023-03-26: 2 g via INTRAVENOUS
  Filled 2023-03-25: qty 2

## 2023-03-25 NOTE — Anesthesia Preprocedure Evaluation (Addendum)
Anesthesia Evaluation  Patient identified by MRN, date of birth, ID band Patient awake    Reviewed: Allergy & Precautions, NPO status , Patient's Chart, lab work & pertinent test results  Airway Mallampati: II  TM Distance: >3 FB Neck ROM: Full    Dental no notable dental hx. (+) Upper Dentures, Missing,    Pulmonary shortness of breath, asthma , COPD, former smoker   Pulmonary exam normal breath sounds clear to auscultation       Cardiovascular hypertension, Pt. on medications +CHF  Normal cardiovascular exam+ dysrhythmias + pacemaker (on Eliquis)  Rhythm:Regular Rate:Normal  Echo 12/2022   1. Left ventricular ejection fraction, by estimation, is 55 to 60%. The left ventricle has normal function. The left ventricle has no regional wall motion abnormalities. Left ventricular diastolic parameters are consistent with Grade I diastolic dysfunction (impaired relaxation).   2. Right ventricular systolic function is normal. The right ventricular size is normal.   3. The mitral valve is normal in structure. Trivial mitral valve regurgitation. No evidence of mitral stenosis.   4. The aortic valve is tricuspid. Aortic valve regurgitation is not visualized. Aortic valve sclerosis/calcification is present, without any evidence of aortic stenosis.   5. The inferior vena cava is normal in size with greater than 50% respiratory variability, suggesting right atrial pressure of 3 mmHg.    Pacer Interrogation 07/2022 [facility] Statistic AP VP Percent: 38.0 % Brady Statistic AP VS Percent: 1.0 % Brady Statistic AS VP Percent: 62.0 %  Brady Statistic AS VS Percent: 1.0 % Brady Statistic RA Percent Paced: 35.0 %  Brady Statistic RV Percent Paced: 99.0 %       Neuro/Psych neg Headaches    GI/Hepatic ,GERD  Medicated and Controlled,,  Endo/Other    Renal/GU Renal diseaseLab Results      Component                Value               Date                        K                        3.3 (L)             03/22/2023                 CREATININE               0.73                03/22/2023                     Musculoskeletal  (+) Arthritis , Osteoarthritis,    Abdominal  (+) + obese  Peds  Hematology  (+) Blood dyscrasia, anemia Lab Results      Component                Value               Date                      WBC                      5.4                 03/22/2023  HGB                      9.5 (L)             03/22/2023                HCT                      31.7 (L)            03/22/2023                MCV                      72.5 (L)            03/22/2023                PLT                      200                 03/22/2023              Anesthesia Other Findings   Reproductive/Obstetrics                             Anesthesia Physical Anesthesia Plan  ASA: 3  Anesthesia Plan: General   Post-op Pain Management: Tylenol PO (pre-op)*   Induction: Intravenous  PONV Risk Score and Plan: 4 or greater and Treatment may vary due to age or medical condition, Ondansetron and Dexamethasone  Airway Management Planned: Oral ETT  Additional Equipment: None  Intra-op Plan:   Post-operative Plan: Extubation in OR  Informed Consent: I have reviewed the patients History and Physical, chart, labs and discussed the procedure including the risks, benefits and alternatives for the proposed anesthesia with the patient or authorized representative who has indicated his/her understanding and acceptance.     Dental advisory given  Plan Discussed with: CRNA  Anesthesia Plan Comments: (2 x PIV)        Anesthesia Quick Evaluation

## 2023-03-25 NOTE — Progress Notes (Signed)
PROGRESS NOTE [name]  RUE:[id number] DOB: 1940-10-03 DOA: 03/20/2023 PCP: Philip Aspen, Limmie Patricia, MD  Brief Narrative/Hospital Course: 82 y.o. female with a known history of COPD, GERD, hypertension, hyperlipidemia and recent history of pulmonary embolism 12/2022, paroxysmal A-fib status post pacemaker presented to the ER with shortness of breath, dyspnea on exertion and wheezing along with reports of dark sticky stools.   In the ED: anemia w/ hemoglobin 7.1,admitted with acute GI bleeding, received 2 units of PRBC.  Patient was on Coumadin for paroxysmal A-fib with irregular dosing, she has been on Eliquis since the diagnosis. 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 Underwent EGD colonoscopy and found to have nearly obstructive colonic mass hemoglobin improving with 2 unit PRBC.  Surgery has been consulted-plan for exploratory laparotomy/extended right hemicolectomy on 8/19.  Cardiology consult for preop evaluation 8/17-BNP slightly elevated but no JVD, with chronic leg edema likely from venous insufficiency, okay to proceed for surgery per cardiology    Subjective: Patient seen and examined this morning. She is resting while Reports one of her sons is flying from [place] today Overnight afebrile doing well on room air BP stable in low 100  Labs with stable renal function BNP 122 hemoglobin up at 9.0 g.  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 Recent Labs    12/08/22 0430 12/09/22 0351 03/20/23 2316  03/21/23 1212 03/22/23 0414 03/24/23 0422 03/25/23 0417  HGB 9.1*   < > 7.1* 10.8* 9.5* 8.8* 9.0*  MCV 78.2*   < > 68.1* 73.3* 72.5* 72.8* 72.9*  VITAMINB12  --   --   --  906  --   --   --   FOLATE  --   --   --  10.2  --   --   --   FERRITIN 8*  --   --  7*  --   --   --   TIBC 363  --   --  314  --   --   --   IRON 20*  --   --  19*  --   --   --    < > = values in this interval not displayed.    Colonic mass in the transverse colon nearly completely obstructing: Concerning for malignancy planning for exploratory laparotomy/extended right hemicolectomy on 03/26/23, seen back for preop evaluation and no further invasive workup for surgery.  Continue plan per general surgery  Hypokalemia and hypomagnesemia: Resolved.  Monitor intermittently Recent Labs  Lab 03/20/23 2316 03/21/23 1212 03/22/23 0413 03/22/23 0414 03/24/23 0422 03/25/23 0417  K 2.9* 3.0*  --  3.3* 4.2 4.1  CALCIUM 7.8* 8.1*  --  7.1* 7.8* 7.9*  MG  --  1.7 1.6*  --  2.5*  --   PHOS  --   --   --   --  2.6  --     Metabolic acidosis: monitor bicarb/BMP  History of pulmonary embolism,recently diagnosed: Holding anticoagulation due to active bleeding.PE was subsegmental.  Currently no indication for bridging with heparin.  Monitor and  resume once okay with surgery   Tachybradycardia syndrome status post pacemaker Chronic diastolic heart failure Soft BP: Euvolemic.  BNP slightly elevated but no evidence of CHF currently.  Monitor volume status.Cardiology s/oNet IO Since Admission: 6,201.27 mL [03/25/23 1045]   GERD: on Protonix  Class I Obesity Body mass index is 30.51 kg/m. : Will benefit with PCP follow-up, weight loss  healthy lifestyle and outpatient sleep evaluation.   DVT prophylaxis: SCDs Start: 03/21/23 0911 Code Status:   Code Status: Full Code Family Communication: plan of care discussed with patient at bedside. Patient status is: Inpatient because of colonic mass Level of care: Telemetry    Dispo: The patient is from: Home            Anticipated disposition: TBD pending surgical intervention of colonic mass Objective: Vitals last 24 hrs: Vitals:   03/24/23 1935 03/24/23 1958 03/25/23 0418 03/25/23 0813  BP:  128/67 106/75   Pulse:  74 82   Resp:      Temp:  98.3 F (36.8 C) 98 F (36.7 C)   TempSrc:  Oral Oral   SpO2: 98% 100% 100% 95%  Weight:      Height:       Weight change:   Physical Examination: General exam: alert awake, o x3 HEENT:Oral mucosa moist, Ear/Nose WNL grossly Respiratory system: Bilaterally clear BS,no use of accessory muscle Cardiovascular system: S1 & S2 +, No JVD. Gastrointestinal system: Abdomen soft,NT,ND, BS+ Nervous System: Alert, awake, moving all extremities,and following commands. Extremities: LE edema neg,distal peripheral pulses palpable and warm.  Skin: No rashes,no icterus. MSK: Normal muscle bulk,tone, power   Medications reviewed:  Scheduled Meds:  atorvastatin  20 mg Oral Daily   feeding supplement  1 Container Oral TID BM   mometasone-formoterol  2 puff Inhalation BID   montelukast  10 mg Oral QHS   pantoprazole (PROTONIX) IV  40 mg Intravenous Q12H   sodium chloride flush  3 mL Intravenous Q12H   Continuous Infusions:  [START ON 03/26/2023] cefoTEtan (CEFOTAN) IV        Diet Order             Diet NPO time specified  Diet effective midnight           Diet full liquid Room service appropriate? Yes; Fluid consistency: Thin  Diet effective now                   Intake/Output Summary (Last 24 hours) at 03/25/2023 1045 Last data filed at 03/25/2023 0900 Gross per 24 hour  Intake 360 ml  Output --  Net 360 ml   Net IO Since Admission: 6,201.27 mL [03/25/23 1045]  Wt Readings from Last 3 Encounters:  03/20/23 85.7 kg  02/07/23 94.8 kg  12/12/22 99.6 kg     Unresulted Labs (From admission, onward)     Start     Ordered   03/21/23 0911  Occult blood card to lab, stool  Once,   R        03/21/23  0910          Data Reviewed: I have personally reviewed following labs and imaging studies CBC: Recent Labs  Lab 03/20/23 2316 03/21/23 1212 03/22/23 0414 03/24/23 0422 03/25/23 0417  WBC 3.9* 6.6 5.4 5.3 6.9  NEUTROABS 2.2  --   --  3.3  --   HGB 7.1* 10.8* 9.5* 8.8* 9.0*  HCT 24.6* 36.5 31.7* 29.2* 30.1*  MCV 68.1* 73.3* 72.5* 72.8* 72.9*  PLT 229 218 200 191 185   Basic Metabolic Panel: Recent Labs  Lab 03/20/23 2316 03/21/23 1212 03/22/23 0413 03/22/23 0414 03/24/23 0422 03/25/23 0417  NA 139 137  --  138 141 139  K 2.9* 3.0*  --  3.3* 4.2 4.1  CL 111 108  --  113* 119* 115*  CO2 22 19*  --  19* 18* 19*  GLUCOSE 87 103*  --  89 76 99  BUN 10 10  --  7* <5* 5*  CREATININE 0.90 0.76  --  0.73 0.82 0.84  CALCIUM 7.8* 8.1*  --  7.1* 7.8* 7.9*  MG  --  1.7 1.6*  --  2.5*  --   PHOS  --   --   --   --  2.6  --    GFR: Estimated Creatinine Clearance: 55 mL/min (by C-G formula based on SCr of 0.84 mg/dL). Liver Function Tests: Recent Labs  Lab 03/22/23 0414 03/24/23 0422  AST 24 28  ALT 14 16  ALKPHOS 80 86  BILITOT 1.4* 1.1  PROT 4.5* 4.6*  ALBUMIN 2.2* 2.2*   Recent Results (from the past 240 hour(s))  SARS Coronavirus 2 by RT PCR (hospital order, performed in [facility] hospital lab) *cepheid single result test* Anterior Nasal Swab     Status: None   Collection Time: 03/20/23 11:31 PM   Specimen: Anterior Nasal Swab  Result Value Ref Range Status   SARS Coronavirus 2 by RT PCR NEGATIVE NEGATIVE Final    Comment: (NOTE) SARS-CoV-2 target nucleic acids are NOT DETECTED.  The SARS-CoV-2 RNA is generally detectable in upper and lower respiratory specimens during the acute phase of infection. The lowest concentration of SARS-CoV-2 viral copies this assay can detect is 250 copies / mL. A negative result does not preclude SARS-CoV-2 infection and should not be used as the sole basis for treatment or other patient management decisions.  A negative result  may occur with improper specimen collection / handling, submission of specimen other than nasopharyngeal swab, presence of viral mutation(s) within the areas targeted by this assay, and inadequate number of viral copies (<250 copies / mL). A negative result must be combined with clinical observations, patient history, and epidemiological information.  Fact Sheet for Patients:   RoadLapTop.co.za  Fact Sheet for Healthcare Providers: http://kim-miller.com/  This test is not yet approved or  cleared by the [place] FDA and has been authorized for detection and/or diagnosis of SARS-CoV-2 by FDA under an Emergency Use Authorization (EUA).  This EUA will remain in effect (meaning this test can be used) for the duration of the COVID-19 declaration under Section 564(b)(1) of the Act, 21 U.S.C. section 360bbb-3(b)(1), unless the authorization is terminated or revoked sooner.  Performed at [facility], [address] [place], [place] [address]     Antimicrobials: Anti-infectives (From admission, onward)    Start     Dose/Rate Route Frequency Ordered Stop   03/26/23 0900  cefoTEtan (CEFOTAN) 2 g in sodium chloride 0.9 % 100 mL IVPB        2 g 200 mL/hr over 30 Minutes Intravenous  Once 03/25/23 1009        Culture/Microbiology No results found for: "SDES", "SPECREQUEST", "CULT", "REPTSTATUS"   Radiology Studies: CT CHEST WO CONTRAST  Result Date: 03/24/2023 CLINICAL DATA:  Colon cancer, staging exam. EXAM: CT CHEST WITHOUT CONTRAST TECHNIQUE: Multidetector CT imaging of the chest was performed following the standard protocol without IV contrast. RADIATION DOSE  REDUCTION: This exam was performed according to the departmental dose-optimization program which includes automated exposure control, adjustment of the mA and/or kV according to patient size and/or use of iterative reconstruction technique. COMPARISON:  Chest CT  dated 12/07/2022. FINDINGS: Cardiovascular: Vascular calcifications are seen in the coronary arteries and aortic arch. Normal heart size. No pericardial effusion. A left subclavian approach cardiac device is redemonstrated. Mediastinum/Nodes: No enlarged mediastinal or axillary lymph nodes. Thyroid gland, trachea, and esophagus demonstrate no significant findings. Lungs/Pleura: There is bilateral lower lung bronchiectasis. There is atelectasis/scarring of the medial left lower lobe, unchanged since 12/07/2022. No pleural effusion or pneumothorax. No suspicious pulmonary nodules in either lung. Upper Abdomen: Gallstones are partially imaged. Rim calcified cystic lesions in the right kidney are partially imaged, unchanged. No imaging follow-up is recommended for this finding. Musculoskeletal: Degenerative changes are seen in the spine. IMPRESSION: 1. No evidence of metastatic disease in the chest. 2. Bilateral lower lung bronchiectasis. Aortic Atherosclerosis (ICD10-I70.0). Electronically Signed   By: Romona Curls M.D.   On: 03/24/2023 13:40     LOS: 3 days   Lanae Boast, MD [facility]  03/25/2023, 10:45 AM

## 2023-03-25 NOTE — Anesthesia Preprocedure Evaluation (Incomplete)
Anesthesia Evaluation    Reviewed: Allergy & Precautions, Patient's Chart, lab work & pertinent test results, Unable to perform ROS - Chart review only  Airway        Dental  (+) Upper Dentures, Missing,    Pulmonary shortness of breath, asthma , COPD, former smoker          Cardiovascular hypertension, Pt. on medications +CHF  + dysrhythmias + pacemaker (on Eliquis)      Neuro/Psych neg Headaches    GI/Hepatic ,GERD  Medicated and Controlled,,  Endo/Other    Renal/GU Renal diseaseLab Results      Component                Value               Date                       K                        3.3 (L)             03/22/2023                 CREATININE               0.73                03/22/2023                     Musculoskeletal  (+) Arthritis , Osteoarthritis,    Abdominal   Peds  Hematology  (+) Blood dyscrasia, anemia Lab Results      Component                Value               Date                      WBC                      5.4                 03/22/2023                HGB                      9.5 (L)             03/22/2023                HCT                      31.7 (L)            03/22/2023                MCV                      72.5 (L)            03/22/2023                PLT                      200                 03/22/2023  Anesthesia Other Findings   Reproductive/Obstetrics                              Anesthesia Physical Anesthesia Plan  ASA: 3  Anesthesia Plan: General   Post-op Pain Management: Tylenol PO (pre-op)*   Induction: Intravenous  PONV Risk Score and Plan: 4 or greater and Treatment may vary due to age or medical condition, Ondansetron and Dexamethasone  Airway Management Planned: Oral ETT  Additional Equipment: None  Intra-op Plan:   Post-operative Plan: Extubation in OR  Informed Consent:   Plan Discussed with:    Anesthesia Plan Comments:          Anesthesia Quick Evaluation

## 2023-03-25 NOTE — H&P (View-Only) (Signed)
2 Days Post-Op   Subjective/Chief Complaint: Patient has been cleared by Cardiology for surgery Chest CT - no evidence of pulmonary metastases CEA level elevated at 32.2 No complaints - had a soft bowel movement yesterday with no gross blood  Objective: Vital signs in last 24 hours: Temp:  [97.9 F (36.6 C)-98.3 F (36.8 C)] 98 F (36.7 C) (08/18 0418) Pulse Rate:  [72-82] 82 (08/18 0418) Resp:  [15] 15 (08/17 1245) BP: (105-128)/(67-75) 106/75 (08/18 0418) SpO2:  [98 %-100 %] 100 % (08/18 0418) Last BM Date : 03/24/23  Intake/Output from previous day: 08/17 0701 - 08/18 0700 In: 360 [P.O.:360] Out: -  Intake/Output this shift: No intake/output data recorded.  Abd: soft, minimal tenderness in central abdomen.  Midline incision well-healed.  She has a moderate-sized reducible umbilical hernia.  Just superior and to the right of  this you can feel a mass that is mildly tender.  This coincides with the mass seen on her CT scan, ND, +BS, no organomegaly   Lab Results:  Recent Labs    03/24/23 0422 03/25/23 0417  WBC 5.3 6.9  HGB 8.8* 9.0*  HCT 29.2* 30.1*  PLT 191 185   BMET Recent Labs    03/24/23 0422 03/25/23 0417  NA 141 139  K 4.2 4.1  CL 119* 115*  CO2 18* 19*  GLUCOSE 76 99  BUN <5* 5*  CREATININE 0.82 0.84  CALCIUM 7.8* 7.9*   PT/INR Recent Labs    03/25/23 0417  LABPROT 17.5*  INR 1.4*   ABG No results for input(s): "PHART", "HCO3" in the last 72 hours.  Invalid input(s): "PCO2", "PO2"  Studies/Results: CT CHEST WO CONTRAST  Result Date: 03/24/2023 CLINICAL DATA:  Colon cancer, staging exam. EXAM: CT CHEST WITHOUT CONTRAST TECHNIQUE: Multidetector CT imaging of the chest was performed following the standard protocol without IV contrast. RADIATION DOSE REDUCTION: This exam was performed according to the departmental dose-optimization program which includes automated exposure control, adjustment of the mA and/or kV according to patient size  and/or use of iterative reconstruction technique. COMPARISON:  Chest CT dated 12/07/2022. FINDINGS: Cardiovascular: Vascular calcifications are seen in the coronary arteries and aortic arch. Normal heart size. No pericardial effusion. A left subclavian approach cardiac device is redemonstrated. Mediastinum/Nodes: No enlarged mediastinal or axillary lymph nodes. Thyroid gland, trachea, and esophagus demonstrate no significant findings. Lungs/Pleura: There is bilateral lower lung bronchiectasis. There is atelectasis/scarring of the medial left lower lobe, unchanged since 12/07/2022. No pleural effusion or pneumothorax. No suspicious pulmonary nodules in either lung. Upper Abdomen: Gallstones are partially imaged. Rim calcified cystic lesions in the right kidney are partially imaged, unchanged. No imaging follow-up is recommended for this finding. Musculoskeletal: Degenerative changes are seen in the spine. IMPRESSION: 1. No evidence of metastatic disease in the chest. 2. Bilateral lower lung bronchiectasis. Aortic Atherosclerosis (ICD10-I70.0). Electronically Signed   By: Romona Curls M.D.   On: 03/24/2023 13:40    Anti-infectives: Anti-infectives (From admission, onward)    None       Assessment/Plan: Near obstructing right/transverse colon mass, suspicious for malignancy - biopsy pending  Hold Eliquis and any long-acting anticoagulation INR 1.4 - acceptable for surgery      Plan exploratory laparotomy/ extended right hemicolectomy on Monday.  The surgical procedure has been discussed with the patient.  Potential risks, benefits, alternative treatments, and expected outcomes have been explained.  All of the patient's questions at this time have been answered.  The likelihood of reaching the patient's treatment  goal is good.  The patient understand the proposed surgical procedure and wishes to proceed.      FEN - FLD/Breeze/SLIV VTE - Eliquis on hold ID - none currently needed   A.  fib Arthritis Asthma COPD HTN HLD pacemaker secondary to tachy-brady syndrome Chronic diastolic HF with LF of 55-60% PE in 2024 on Eliquis Anemia Obesity GERD    LOS: 3 days    [name] 03/25/2023

## 2023-03-25 NOTE — Progress Notes (Signed)
No additional cardiology recs, please refer to consult yesterday. Slightly elevated BNP but no JVD and lungs are clear, lungs also clear by imaging with CT. She has chronic LE edema related to venous insufficiency as opposed to heart failure. Ok to proceed with surgery as discussed in consult note from cardiac standpoint, we will sign off inpatient care  Dominga Ferry MD

## 2023-03-25 NOTE — Plan of Care (Signed)
  Problem: Clinical Measurements: Goal: Diagnostic test results will improve Outcome: Progressing   Problem: Activity: Goal: Risk for activity intolerance will decrease Outcome: Progressing   Problem: Pain Managment: Goal: General experience of comfort will improve Outcome: Progressing   Problem: Safety: Goal: Ability to remain free from injury will improve Outcome: Progressing   

## 2023-03-25 NOTE — Progress Notes (Signed)
2 Days Post-Op   Subjective/Chief Complaint: Patient has been cleared by Cardiology for surgery Chest CT - no evidence of pulmonary metastases CEA level elevated at 32.2 No complaints - had a soft bowel movement yesterday with no gross blood  Objective: Vital signs in last 24 hours: Temp:  [97.9 F (36.6 C)-98.3 F (36.8 C)] 98 F (36.7 C) (08/18 0418) Pulse Rate:  [72-82] 82 (08/18 0418) Resp:  [15] 15 (08/17 1245) BP: (105-128)/(67-75) 106/75 (08/18 0418) SpO2:  [98 %-100 %] 100 % (08/18 0418) Last BM Date : 03/24/23  Intake/Output from previous day: 08/17 0701 - 08/18 0700 In: 360 [P.O.:360] Out: -  Intake/Output this shift: No intake/output data recorded.  Abd: soft, minimal tenderness in central abdomen.  Midline incision well-healed.  She has a moderate-sized reducible umbilical hernia.  Just superior and to the right of  this you can feel a mass that is mildly tender.  This coincides with the mass seen on her CT scan, ND, +BS, no organomegaly   Lab Results:  Recent Labs    03/24/23 0422 03/25/23 0417  WBC 5.3 6.9  HGB 8.8* 9.0*  HCT 29.2* 30.1*  PLT 191 185   BMET Recent Labs    03/24/23 0422 03/25/23 0417  NA 141 139  K 4.2 4.1  CL 119* 115*  CO2 18* 19*  GLUCOSE 76 99  BUN <5* 5*  CREATININE 0.82 0.84  CALCIUM 7.8* 7.9*   PT/INR Recent Labs    03/25/23 0417  LABPROT 17.5*  INR 1.4*   ABG No results for input(s): "PHART", "HCO3" in the last 72 hours.  Invalid input(s): "PCO2", "PO2"  Studies/Results: CT CHEST WO CONTRAST  Result Date: 03/24/2023 CLINICAL DATA:  Colon cancer, staging exam. EXAM: CT CHEST WITHOUT CONTRAST TECHNIQUE: Multidetector CT imaging of the chest was performed following the standard protocol without IV contrast. RADIATION DOSE REDUCTION: This exam was performed according to the departmental dose-optimization program which includes automated exposure control, adjustment of the mA and/or kV according to patient size  and/or use of iterative reconstruction technique. COMPARISON:  Chest CT dated 12/07/2022. FINDINGS: Cardiovascular: Vascular calcifications are seen in the coronary arteries and aortic arch. Normal heart size. No pericardial effusion. A left subclavian approach cardiac device is redemonstrated. Mediastinum/Nodes: No enlarged mediastinal or axillary lymph nodes. Thyroid gland, trachea, and esophagus demonstrate no significant findings. Lungs/Pleura: There is bilateral lower lung bronchiectasis. There is atelectasis/scarring of the medial left lower lobe, unchanged since 12/07/2022. No pleural effusion or pneumothorax. No suspicious pulmonary nodules in either lung. Upper Abdomen: Gallstones are partially imaged. Rim calcified cystic lesions in the right kidney are partially imaged, unchanged. No imaging follow-up is recommended for this finding. Musculoskeletal: Degenerative changes are seen in the spine. IMPRESSION: 1. No evidence of metastatic disease in the chest. 2. Bilateral lower lung bronchiectasis. Aortic Atherosclerosis (ICD10-I70.0). Electronically Signed   By: Romona Curls M.D.   On: 03/24/2023 13:40    Anti-infectives: Anti-infectives (From admission, onward)    None       Assessment/Plan: Near obstructing right/transverse colon mass, suspicious for malignancy - biopsy pending  Hold Eliquis and any long-acting anticoagulation INR 1.4 - acceptable for surgery      Plan exploratory laparotomy/ extended right hemicolectomy on Monday.  The surgical procedure has been discussed with the patient.  Potential risks, benefits, alternative treatments, and expected outcomes have been explained.  All of the patient's questions at this time have been answered.  The likelihood of reaching the patient's treatment  goal is good.  The patient understand the proposed surgical procedure and wishes to proceed.      FEN - FLD/Breeze/SLIV VTE - Eliquis on hold ID - none currently needed   A.  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

## 2023-03-26 ENCOUNTER — Encounter (HOSPITAL_COMMUNITY): Payer: Self-pay | Admitting: Family Medicine

## 2023-03-26 ENCOUNTER — Other Ambulatory Visit: Payer: Self-pay

## 2023-03-26 ENCOUNTER — Inpatient Hospital Stay (HOSPITAL_COMMUNITY): Payer: Medicare HMO | Admitting: Anesthesiology

## 2023-03-26 ENCOUNTER — Encounter (HOSPITAL_COMMUNITY)
Admission: EM | Disposition: A | Discharge status: Skilled Nursing Facility | Payer: Self-pay | Source: Home / Self Care | Attending: Internal Medicine

## 2023-03-26 DIAGNOSIS — C189 Malignant neoplasm of colon, unspecified: Secondary | ICD-10-CM

## 2023-03-26 DIAGNOSIS — I5033 Acute on chronic diastolic (congestive) heart failure: Secondary | ICD-10-CM | POA: Diagnosis not present

## 2023-03-26 DIAGNOSIS — I110 Hypertensive heart disease with heart failure: Secondary | ICD-10-CM | POA: Diagnosis not present

## 2023-03-26 DIAGNOSIS — D649 Anemia, unspecified: Secondary | ICD-10-CM | POA: Diagnosis not present

## 2023-03-26 DIAGNOSIS — Z87891 Personal history of nicotine dependence: Secondary | ICD-10-CM | POA: Diagnosis not present

## 2023-03-26 HISTORY — PX: LAPAROTOMY: SHX154

## 2023-03-26 SURGERY — LAPAROTOMY, EXPLORATORY
Anesthesia: General

## 2023-03-26 MED ORDER — FENTANYL CITRATE (PF) 250 MCG/5ML IJ SOLN
INTRAMUSCULAR | Status: AC
  Filled 2023-03-26: qty 5

## 2023-03-26 MED ORDER — ROCURONIUM BROMIDE 10 MG/ML (PF) SYRINGE
PREFILLED_SYRINGE | INTRAVENOUS | Status: DC | PRN
  Administered 2023-03-26: 60 mg via INTRAVENOUS

## 2023-03-26 MED ORDER — DROPERIDOL 2.5 MG/ML IJ SOLN: 0.6250 mg | Freq: Once | INTRAMUSCULAR | Status: DC | PRN

## 2023-03-26 MED ORDER — DIPHENHYDRAMINE HCL 12.5 MG/5ML PO ELIX: 12.5000 mg | ORAL_SOLUTION | Freq: Four times a day (QID) | ORAL | Status: DC | PRN

## 2023-03-26 MED ORDER — SUGAMMADEX SODIUM 500 MG/5ML IV SOLN
INTRAVENOUS | Status: DC | PRN
  Administered 2023-03-26: 200 mg via INTRAVENOUS

## 2023-03-26 MED ORDER — FENTANYL CITRATE (PF) 100 MCG/2ML IJ SOLN
INTRAMUSCULAR | Status: DC | PRN
  Administered 2023-03-26: 50 ug via INTRAVENOUS
  Administered 2023-03-26: 25 ug via INTRAVENOUS
  Administered 2023-03-26: 100 ug via INTRAVENOUS
  Administered 2023-03-26: 50 ug via INTRAVENOUS
  Administered 2023-03-26: 25 ug via INTRAVENOUS

## 2023-03-26 MED ORDER — LIDOCAINE 2% (20 MG/ML) 5 ML SYRINGE
INTRAMUSCULAR | Status: DC | PRN
  Administered 2023-03-26: 80 mg via INTRAVENOUS

## 2023-03-26 MED ORDER — ONDANSETRON HCL 4 MG/2ML IJ SOLN
INTRAMUSCULAR | Status: AC
  Filled 2023-03-26: qty 2

## 2023-03-26 MED ORDER — PHENYLEPHRINE 80 MCG/ML (10ML) SYRINGE FOR IV PUSH (FOR BLOOD PRESSURE SUPPORT)
PREFILLED_SYRINGE | INTRAVENOUS | Status: AC
  Filled 2023-03-26: qty 10

## 2023-03-26 MED ORDER — 0.9 % SODIUM CHLORIDE (POUR BTL) OPTIME
TOPICAL | Status: DC | PRN
  Administered 2023-03-26: 3000 mL
  Administered 2023-03-26: 1000 mL

## 2023-03-26 MED ORDER — DEXAMETHASONE SODIUM PHOSPHATE 4 MG/ML IJ SOLN
INTRAMUSCULAR | Status: DC | PRN
  Administered 2023-03-26: 8 mg via INTRAVENOUS

## 2023-03-26 MED ORDER — DEXAMETHASONE SODIUM PHOSPHATE 10 MG/ML IJ SOLN
INTRAMUSCULAR | Status: AC
  Filled 2023-03-26: qty 1

## 2023-03-26 MED ORDER — SIMETHICONE 80 MG PO CHEW
40.0000 mg | CHEWABLE_TABLET | Freq: Four times a day (QID) | ORAL | Status: DC | PRN
  Administered 2023-04-04: 40 mg via ORAL
  Filled 2023-03-26: qty 1

## 2023-03-26 MED ORDER — LACTATED RINGERS IV SOLN
INTRAVENOUS | Status: DC | PRN
Start: 2023-03-26 — End: 2023-03-26

## 2023-03-26 MED ORDER — SODIUM CHLORIDE 0.9 % IV SOLN
2.0000 g | Freq: Two times a day (BID) | INTRAVENOUS | Status: AC
  Administered 2023-03-27: 2 g via INTRAVENOUS
  Filled 2023-03-26: qty 2

## 2023-03-26 MED ORDER — ACETAMINOPHEN 500 MG PO TABS
1000.0000 mg | ORAL_TABLET | Freq: Four times a day (QID) | ORAL | Status: DC
  Administered 2023-03-27 – 2023-04-06 (×37): 1000 mg via ORAL
  Filled 2023-03-26 (×41): qty 2

## 2023-03-26 MED ORDER — HYDROMORPHONE HCL 1 MG/ML IJ SOLN
INTRAMUSCULAR | Status: AC
  Administered 2023-03-26: 0.25 mg via INTRAVENOUS
  Filled 2023-03-26: qty 1

## 2023-03-26 MED ORDER — LACTATED RINGERS IV SOLN: INTRAVENOUS | Status: DC

## 2023-03-26 MED ORDER — DIPHENHYDRAMINE HCL 50 MG/ML IJ SOLN: 12.5000 mg | Freq: Four times a day (QID) | INTRAMUSCULAR | Status: DC | PRN

## 2023-03-26 MED ORDER — PHENYLEPHRINE 80 MCG/ML (10ML) SYRINGE FOR IV PUSH (FOR BLOOD PRESSURE SUPPORT)
PREFILLED_SYRINGE | INTRAVENOUS | Status: DC | PRN
  Administered 2023-03-26: 80 ug via INTRAVENOUS
  Administered 2023-03-26 (×2): 160 ug via INTRAVENOUS
  Administered 2023-03-26 (×3): 80 ug via INTRAVENOUS
  Administered 2023-03-26: 160 ug via INTRAVENOUS

## 2023-03-26 MED ORDER — PROPOFOL 10 MG/ML IV BOLUS
INTRAVENOUS | Status: DC | PRN
Start: 2023-03-26 — End: 2023-03-26
  Administered 2023-03-26: 100 mg via INTRAVENOUS

## 2023-03-26 MED ORDER — LIDOCAINE HCL (PF) 2 % IJ SOLN
INTRAMUSCULAR | Status: AC
  Filled 2023-03-26: qty 5

## 2023-03-26 MED ORDER — MORPHINE SULFATE (PF) 2 MG/ML IV SOLN: 2.0000 mg | INTRAVENOUS | Status: DC | PRN

## 2023-03-26 MED ORDER — LIDOCAINE HCL (PF) 2 % IJ SOLN
INTRAMUSCULAR | Status: DC | PRN
  Administered 2023-03-26: 1.5 mg/kg/h via INTRADERMAL

## 2023-03-26 MED ORDER — ONDANSETRON HCL 4 MG/2ML IJ SOLN
INTRAMUSCULAR | Status: DC | PRN
Start: 2023-03-26 — End: 2023-03-26
  Administered 2023-03-26: 4 mg via INTRAVENOUS

## 2023-03-26 MED ORDER — HYDROMORPHONE HCL 1 MG/ML IJ SOLN
0.2500 mg | INTRAMUSCULAR | Status: DC | PRN
  Administered 2023-03-26 (×3): 0.25 mg via INTRAVENOUS

## 2023-03-26 MED ORDER — ROCURONIUM BROMIDE 10 MG/ML (PF) SYRINGE
PREFILLED_SYRINGE | INTRAVENOUS | Status: AC
  Filled 2023-03-26: qty 10

## 2023-03-26 MED ORDER — PROPOFOL 10 MG/ML IV BOLUS
INTRAVENOUS | Status: AC
  Filled 2023-03-26: qty 20

## 2023-03-26 SURGICAL SUPPLY — 53 items
APL SWBSTK 6 STRL LF DISP (MISCELLANEOUS) ×2
APPLICATOR COTTON TIP 6 STRL (MISCELLANEOUS) ×4 IMPLANT
APPLICATOR COTTON TIP 6IN STRL (MISCELLANEOUS) ×2 IMPLANT
BAG COUNTER SPONGE SURGICOUNT (BAG) IMPLANT
BAG SPNG CNTER NS LX DISP (BAG)
BLADE EXTENDED COATED 6.5IN (ELECTRODE) IMPLANT
CELLS DAT CNTRL 66122 CELL SVR (MISCELLANEOUS) IMPLANT
CLIP TI LARGE 6 (CLIP) IMPLANT
COVER SURGICAL LIGHT HANDLE (MISCELLANEOUS) ×4 IMPLANT
DRSG OPSITE POSTOP 4X10 (GAUZE/BANDAGES/DRESSINGS) IMPLANT
DRSG OPSITE POSTOP 4X8 (GAUZE/BANDAGES/DRESSINGS) IMPLANT
ELECT REM PT RETURN 15FT ADLT (MISCELLANEOUS) ×2 IMPLANT
GAUZE PAD ABD 8X10 STRL (GAUZE/BANDAGES/DRESSINGS) IMPLANT
GAUZE SPONGE 4X4 12PLY STRL (GAUZE/BANDAGES/DRESSINGS) ×2 IMPLANT
GLOVE BIO SURGEON STRL SZ7 (GLOVE) ×4 IMPLANT
GLOVE BIOGEL PI IND STRL 7.0 (GLOVE) ×2 IMPLANT
GLOVE BIOGEL PI IND STRL 7.5 (GLOVE) ×4 IMPLANT
GOWN STRL REUS W/ TWL LRG LVL3 (GOWN DISPOSABLE) ×4 IMPLANT
GOWN STRL REUS W/TWL LRG LVL3 (GOWN DISPOSABLE) ×2
HANDLE SUCTION POOLE (INSTRUMENTS) IMPLANT
KIT TURNOVER KIT A (KITS) IMPLANT
LIGASURE IMPACT 36 18CM CVD LR (INSTRUMENTS) IMPLANT
NS IRRIG 1000ML POUR BTL (IV SOLUTION) ×4 IMPLANT
PACK COLON (CUSTOM PROCEDURE TRAY) ×2 IMPLANT
PENCIL SMOKE EVACUATOR (MISCELLANEOUS) ×2 IMPLANT
RELOAD PROXIMATE 75MM BLUE (ENDOMECHANICALS) ×2 IMPLANT
RELOAD STAPLE 75 3.8 BLU REG (ENDOMECHANICALS) IMPLANT
RETRACTOR WND ALEXIS 18 MED (MISCELLANEOUS) IMPLANT
RETRACTOR WND ALEXIS 25 LRG (MISCELLANEOUS) IMPLANT
RTRCTR WOUND ALEXIS 18CM MED (MISCELLANEOUS)
RTRCTR WOUND ALEXIS 25CM LRG (MISCELLANEOUS)
SHEARS HARMONIC ACE PLUS 36CM (ENDOMECHANICALS) IMPLANT
SPONGE T-LAP 18X18 ~~LOC~~+RFID (SPONGE) IMPLANT
STAPLER GUN LINEAR PROX 60 (STAPLE) IMPLANT
STAPLER PROXIMATE 75MM BLUE (STAPLE) IMPLANT
STAPLER VISISTAT 35W (STAPLE) ×2 IMPLANT
SUCTION POOLE HANDLE (INSTRUMENTS)
SUT NOV 1 T60/GS (SUTURE) IMPLANT
SUT NOVA NAB DX-16 0-1 5-0 T12 (SUTURE) IMPLANT
SUT NOVA T20/GS 25 (SUTURE) IMPLANT
SUT PDS AB 1 TP1 96 (SUTURE) ×4 IMPLANT
SUT SILK 2 0 (SUTURE) ×1
SUT SILK 2 0 SH CR/8 (SUTURE) ×2 IMPLANT
SUT SILK 2 0SH CR/8 30 (SUTURE) IMPLANT
SUT SILK 2-0 18XBRD TIE 12 (SUTURE) ×2 IMPLANT
SUT SILK 2-0 30XBRD TIE 12 (SUTURE) IMPLANT
SUT SILK 3 0 (SUTURE) ×2
SUT SILK 3 0 SH CR/8 (SUTURE) ×2 IMPLANT
SUT SILK 3-0 18XBRD TIE 12 (SUTURE) ×4 IMPLANT
TOWEL OR 17X26 10 PK STRL BLUE (TOWEL DISPOSABLE) IMPLANT
TRAY FOLEY MTR SLVR 16FR STAT (SET/KITS/TRAYS/PACK) ×2 IMPLANT
TUBING CONNECTING 10 (TUBING) ×4 IMPLANT
WATER STERILE IRR 1000ML POUR (IV SOLUTION) IMPLANT

## 2023-03-26 NOTE — Anesthesia Procedure Notes (Signed)
Procedure Name: Intubation Date/Time: 03/26/2023 10:36 AM  Performed by: Ludwig Lean, CRNAPre-anesthesia Checklist: Patient identified, Emergency Drugs available, Suction available and Patient being monitored Patient Re-evaluated:Patient Re-evaluated prior to induction Oxygen Delivery Method: Circle system utilized Preoxygenation: Pre-oxygenation with 100% oxygen Induction Type: IV induction Ventilation: Mask ventilation without difficulty Laryngoscope Size: Mac and 3 Grade View: Grade I Tube type: Oral Tube size: 7.0 mm Number of attempts: 1 Airway Equipment and Method: Stylet Placement Confirmation: ETT inserted through vocal cords under direct vision, positive ETCO2 and breath sounds checked- equal and bilateral Secured at: 21 cm Tube secured with: Tape Dental Injury: Teeth and Oropharynx as per pre-operative assessment

## 2023-03-26 NOTE — Op Note (Signed)
Pre-op diagnosis: Ascending colon cancer/umbilical hernia Postop diagnosis: Same Procedure performed: Exploratory laparotomy, lysis of adhesions, right hemicolectomy with primary anastomosis Surgeon:Cobie Leidner K Emmerich Cryer Assistant: Dr. Reatha Harps Anesthesia: General Endotracheal Indications: This is an 82 year old female with multiple medical issues including COPD, hypertension, atrial fibrillation requiring anticoagulation who presented with dark stools and fatigue.  She has had weight loss.  Her initial hemoglobin was 7.1.  CT scan showed an ascending colon mass.  She underwent colonoscopy that revealed a near obstructing ascending colon mass that appears to be malignant.  Pathology still pending.  His CEA level was elevated.  We recommended resection of the ascending colon.  She was cleared by cardiology.  Her INR is 1.4.  Description of procedure: The patient is brought to the operating room and placed in the supine position on the operating room table.  After an adequate level of general anesthesia was obtained, a Foley catheter was placed under sterile technique.  The patient's abdomen was prepped with ChloraPrep and draped sterile fashion.  A timeout was taken to ensure the proper patient and proper procedure.  The patient has an obvious hernia at her umbilicus.  She has previous lower midline incision from a gynecologic procedure.  We made a vertical midline incision from her previous scar around the umbilicus into the epigastrium.  We dissected down through the subcutaneous tissues to the linea alba with cautery.  We dissected around the hernia sacs of which there were 2.  She seems to have 2 adjacent defects just above the umbilicus.  We open the fascia widely.  Both hernia sacs seem to contain only some omentum.  The patient has adhesions to the anterior abdominal wall, especially below the umbilicus.  We cleared the adhesions in all directions.  The Bookwalter retractor was then inserted to provide  exposure.  The tattoo ink is visualized in the distal ascending colon.  There is no evidence of intussusception of the bowel today.  There are small bowel adhesions from the terminal ileum down into the pelvis.  The cecum and appendix appear normal.  The gallbladder has palpable stones but no sign of acute cholecystitis.  The visualized portion of the liver appears normal.  The stomach also appears normal.  We then began mobilizing the colon by taking down the white line of Toldt laterally.  We mobilized the colon medially.  We continued our dissection superiorly around the hepatic flexure until we reached the mid transverse colon.  We then continued our dissection inferiorly down around the cecum.  The patient has a lot of adhesions of the small bowel down into the pelvis.  We spent some time mobilizing the small bowel out of the pelvis.  This was done with a combination of blunt dissection and sharp dissection.  Once we had completed our mobilization we divided the terminal ileum 15 cm proximal to the ileocecal valve with a GIA 75 stapler.  The transverse colon was divided just proximal to the middle colic artery with another load of the GIA 75.  The LigaSure device was then used to take down the mesentery of the intervening mesentery.  We were able to visualize the duodenum and preserved this posteriorly.  We are well away from the kidney and ureters.  Once we completed our resection, the specimen was sent for pathologic examination.  A portion of the hernia sac was also excised and included with the specimen.  We then irrigated thoroughly and inspected for hemostasis.  The patient seems to have some  diffuse oozing from the retroperitoneum but no vessels were noted that were bleeding.  We used cautery to aid in hemostasis.  We irrigated thoroughly.  We created a side-to-side anastomosis with a GIA 75 stapler.  The common enterotomy was closed with a TX 60 stapler.  A crotch suture of 2-0 silk was placed.  The  mesenteric defect was closed with 2-0 silk.  The anastomosis was palpated and is widely patent.  There is no tension on the anastomosis.  This was replaced back into the right side of the abdomen.  The greater omentum was placed over the anastomosis.  We again inspected for hemostasis and irrigated thoroughly.  We changed our gowns and gloves according to the colorectal protocol.  The fascia was reapproximated with double-stranded #1 PDS suture.  This included the previous umbilical fascial defects.  The subcutaneous tissues were irrigated and cautery was used for hemostasis.  Staples were used to close the skin.  A honeycomb dressing is applied.  The nasogastric tube was removed.  The patient is extubated brought to recovery room in stable condition.  All sponge, instrument, and needle counts are correct.  Wilmon Arms. Corliss Skains, MD, [facility] Surgery  General Surgery   03/26/2023 12:38 PM

## 2023-03-26 NOTE — Plan of Care (Signed)
  Problem: Clinical Measurements: Goal: Will remain free from infection Outcome: Progressing Goal: Diagnostic test results will improve Outcome: Progressing   Problem: Activity: Goal: Risk for activity intolerance will decrease Outcome: Progressing   Problem: Nutrition: Goal: Adequate nutrition will be maintained Outcome: Progressing   Problem: Coping: Goal: Level of anxiety will decrease Outcome: Progressing   Problem: Pain Managment: Goal: General experience of comfort will improve Outcome: Progressing   Problem: Safety: Goal: Ability to remain free from injury will improve Outcome: Progressing   Problem: Skin Integrity: Goal: Risk for impaired skin integrity will decrease Outcome: Progressing   Problem: Education: Goal: Knowledge of General Education information will improve Description: Including pain rating scale, medication(s)/side effects and non-pharmacologic comfort measures Outcome: Adequate for Discharge   Problem: Health Behavior/Discharge Planning: Goal: Ability to manage health-related needs will improve Outcome: Adequate for Discharge

## 2023-03-26 NOTE — Progress Notes (Signed)
PROGRESS NOTE [name]  BJY:[id number] DOB: [birth year] DOA: 03/20/2023 PCP: Philip Aspen, Limmie Patricia, MD  Brief Narrative/Hospital Course: 82 y.o. female with known history of COPD, GERD, hypertension, hyperlipidemia and recent history of pulmonary embolism 12/2022, paroxysmal A-fib status post pacemaker presented to the ER with shortness of breath, dyspnea on exertion and wheezing along with reports of dark sticky stools.   In the ED: anemia w/ hemoglobin 7.1,admitted with acute GI bleeding, received 2 units of PRBC.  Patient was on Coumadin for paroxysmal A-fib with irregular dosing, she has been on Eliquis since the diagnosis. 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 Underwent EGD colonoscopy and found to have nearly obstructive colonic mass hemoglobin improving with 2 unit PRBC.  8/19, ex lap, extended right hemicolectomy and primary anastomosis.    Subjective: Patient seen in the morning rounds.  No complaints.  She was ready to go to surgery.  Patient had come back from surgery by the time this note is created.  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 Recent Labs    12/08/22 0430 12/09/22 0351 03/20/23 2316 03/21/23 1212 03/22/23 0414 03/24/23 0422 03/25/23 0417  HGB 9.1*   < > 7.1* 10.8* 9.5* 8.8* 9.0*  MCV 78.2*   < > 68.1* 73.3* 72.5* 72.8* 72.9*  VITAMINB12  --   --   --  906  --   --   --   FOLATE  --   --   --  10.2  --   --   --   FERRITIN 8*  --   --  7*  --   --   --   TIBC 363  --   --  314   --   --   --   IRON 20*  --   --  19*  --   --   --    < > = values in this interval not displayed.    Colonic mass in the transverse colon nearly completely obstructing: Status post colectomy today.  Postop plan as per surgery.    Hypokalemia and hypomagnesemia: Resolved.  Monitor intermittently Recent Labs  Lab 03/20/23 2316 03/21/23 1212 03/22/23 0413 03/22/23 0414 03/24/23 0422 03/25/23 0417  K 2.9* 3.0*  --  3.3* 4.2 4.1  CALCIUM 7.8* 8.1*  --  7.1* 7.8* 7.9*  MG  --  1.7 1.6*  --  2.5*  --   PHOS  --   --   --   --  2.6  --     Metabolic acidosis: monitor bicarb/BMP  History of pulmonary embolism,recently diagnosed: Holding anticoagulation due to active bleeding.PE was subsegmental.  Currently no indication for bridging with heparin.    Tachybradycardia syndrome status post pacemaker Chronic diastolic heart failure Soft BP: Euvolemic.  BNP slightly elevated but no evidence of CHF currently.  Monitor volume status.Cardiology s/oNet IO Since Admission: 6,666.27 mL [03/26/23 1609]   GERD: on Protonix  Class I Obesity Body mass index is 30.51 kg/m. : Will benefit with PCP follow-up, weight loss  healthy lifestyle and  outpatient sleep evaluation.   DVT prophylaxis: SCDs Start: 03/21/23 0911 Code Status:   Code Status: Full Code Family Communication: Patient. Patient status is: Inpatient because of colonic mass Level of care: Telemetry   Dispo: The patient is from: Home            Anticipated disposition: TBD pending surgical intervention of colonic mass Objective: Vitals last 24 hrs: Vitals:   03/26/23 1315 03/26/23 1330 03/26/23 1351 03/26/23 1409  BP: 134/70 112/62 128/77   Pulse: 69 70 89 86  Resp: 16 15 20    Temp:  97.7 F (36.5 C) 97.7 F (36.5 C)   TempSrc:   Oral   SpO2: 100% 97% 91% 99%  Weight:      Height:       Weight change:   Physical Examination: Looks comfortable.  Medications reviewed:  Scheduled Meds:  atorvastatin  20 mg Oral  Daily   feeding supplement  1 Container Oral TID BM   mometasone-formoterol  2 puff Inhalation BID   montelukast  10 mg Oral QHS   pantoprazole (PROTONIX) IV  40 mg Intravenous Q12H   sodium chloride flush  3 mL Intravenous Q12H   Continuous Infusions:      Diet Order             Diet NPO time specified  Diet effective midnight                   Intake/Output Summary (Last 24 hours) at 03/26/2023 1609 Last data filed at 03/26/2023 1213 Gross per 24 hour  Intake 300 ml  Output 55 ml  Net 245 ml   Net IO Since Admission: 6,666.27 mL [03/26/23 1609]  Wt Readings from Last 3 Encounters:  03/20/23 85.7 kg  02/07/23 94.8 kg  12/12/22 99.6 kg     Unresulted Labs (From admission, onward)     Start     Ordered   03/21/23 0911  Occult blood card to lab, stool  Once,   R        03/21/23 0910   Signed and Held  CBC  Daily,   R     Question:  Specimen collection method  Answer:  Lab=Lab collect   Signed and Held   Signed and Held  Basic metabolic panel  Daily,   R     Question:  Specimen collection method  Answer:  Lab=Lab collect   Signed and Held          Data Reviewed: I have personally reviewed following labs and imaging studies CBC: Recent Labs  Lab 03/20/23 2316 03/21/23 1212 03/22/23 0414 03/24/23 0422 03/25/23 0417  WBC 3.9* 6.6 5.4 5.3 6.9  NEUTROABS 2.2  --   --  3.3  --   HGB 7.1* 10.8* 9.5* 8.8* 9.0*  HCT 24.6* 36.5 31.7* 29.2* 30.1*  MCV 68.1* 73.3* 72.5* 72.8* 72.9*  PLT 229 218 200 191 185   Basic Metabolic Panel: Recent Labs  Lab 03/20/23 2316 03/21/23 1212 03/22/23 0413 03/22/23 0414 03/24/23 0422 03/25/23 0417  NA 139 137  --  138 141 139  K 2.9* 3.0*  --  3.3* 4.2 4.1  CL 111 108  --  113* 119* 115*  CO2 22 19*  --  19* 18* 19*  GLUCOSE 87 103*  --  89 76 99  BUN 10 10  --  7* <5* 5*  CREATININE 0.90 0.76  --  0.73 0.82 0.84  CALCIUM 7.8* 8.1*  --  7.1*  7.8* 7.9*  MG  --  1.7 1.6*  --  2.5*  --   PHOS  --   --   --   --  2.6   --    GFR: Estimated Creatinine Clearance: 55 mL/min (by C-G formula based on SCr of 0.84 mg/dL). Liver Function Tests: Recent Labs  Lab 03/22/23 0414 03/24/23 0422  AST 24 28  ALT 14 16  ALKPHOS 80 86  BILITOT 1.4* 1.1  PROT 4.5* 4.6*  ALBUMIN 2.2* 2.2*   Recent Results (from the past 240 hour(s))  SARS Coronavirus 2 by RT PCR (hospital order, performed in [facility] hospital lab) *cepheid single result test* Anterior Nasal Swab     Status: None   Collection Time: 03/20/23 11:31 PM   Specimen: Anterior Nasal Swab  Result Value Ref Range Status   SARS Coronavirus 2 by RT PCR NEGATIVE NEGATIVE Final    Comment: (NOTE) SARS-CoV-2 target nucleic acids are NOT DETECTED.  The SARS-CoV-2 RNA is generally detectable in upper and lower respiratory specimens during the acute phase of infection. The lowest concentration of SARS-CoV-2 viral copies this assay can detect is 250 copies / mL. A negative result does not preclude SARS-CoV-2 infection and should not be used as the sole basis for treatment or other patient management decisions.  A negative result may occur with improper specimen collection / handling, submission of specimen other than nasopharyngeal swab, presence of viral mutation(s) within the areas targeted by this assay, and inadequate number of viral copies (<250 copies / mL). A negative result must be combined with clinical observations, patient history, and epidemiological information.  Fact Sheet for Patients:   RoadLapTop.co.za  Fact Sheet for Healthcare Providers: http://kim-miller.com/  This test is not yet approved or  cleared by the [place] FDA and has been authorized for detection and/or diagnosis of SARS-CoV-2 by FDA under an Emergency Use Authorization (EUA).  This EUA will remain in effect (meaning this test can be used) for the duration of the COVID-19 declaration under Section 564(b)(1) of the Act, 21  U.S.C. section 360bbb-3(b)(1), unless the authorization is terminated or revoked sooner.  Performed at [facility], [address] [place], [place] [address]     Antimicrobials: Anti-infectives (From admission, onward)    Start     Dose/Rate Route Frequency Ordered Stop   03/26/23 0900  cefoTEtan (CEFOTAN) 2 g in sodium chloride 0.9 % 100 mL IVPB        2 g 200 mL/hr over 30 Minutes Intravenous  Once 03/25/23 1009 03/26/23 1038      Culture/Microbiology No results found for: "SDES", "SPECREQUEST", "CULT", "REPTSTATUS"   Radiology Studies: No results found.   LOS: 4 days   Dorcas Carrow, MD [facility]  03/26/2023, 4:09 PM

## 2023-03-26 NOTE — Transfer of Care (Signed)
Immediate Anesthesia Transfer of Care Note  Patient: [name]  Procedure(s) Performed: Procedure(s): EXPLORATORY LAPAROTOMY, RIGHT HEMICOLECTOMY; LYSIS OF ADHESIONS (N/A)  Patient Location: PACU  Anesthesia Type:General  Level of Consciousness: Patient easily awoken, sedated, comfortable, cooperative, following commands, responds to stimulation.   Airway & Oxygen Therapy: Patient spontaneously breathing, ventilating well, oxygen via simple oxygen mask.  Post-op Assessment: Report given to PACU RN, vital signs reviewed and stable, moving all extremities.   Post vital signs: Reviewed and stable.  Complications: No apparent anesthesia complications  Last Vitals:  Vitals Value Taken Time  BP 137/106 03/26/23 1234  Temp    Pulse 85 03/26/23 1235  Resp 19 03/26/23 1235  SpO2 98 % 03/26/23 1235  Vitals shown include unfiled device data.  Last Pain:  Vitals:   03/26/23 0921  TempSrc:   PainSc: 0-No pain      Patients Stated Pain Goal: 0 (03/26/23 0981)  Complications: No notable events documented.

## 2023-03-26 NOTE — Interval H&P Note (Signed)
History and Physical Interval Note:  03/26/2023 8:35 AM  [name]  has presented today for surgery, with the diagnosis of Right colon cancer.  The various methods of treatment have been discussed with the patient and family. After consideration of risks, benefits and other options for treatment, the patient has consented to  Procedure(s): EXPLORATORY LAPAROTOMY, RIGHT HEMICOLECTOMY (N/A) as a surgical intervention.  The patient's history has been reviewed, patient examined, no change in status, stable for surgery.  I have reviewed the patient's chart and labs.  Questions were answered to the patient's satisfaction.  I also discussed the procedure with her son at the bedside.   Wynona Luna

## 2023-03-27 ENCOUNTER — Ambulatory Visit (HOSPITAL_BASED_OUTPATIENT_CLINIC_OR_DEPARTMENT_OTHER): Payer: Medicare HMO | Admitting: Family

## 2023-03-27 ENCOUNTER — Encounter (HOSPITAL_COMMUNITY): Payer: Self-pay | Admitting: Surgery

## 2023-03-27 DIAGNOSIS — D649 Anemia, unspecified: Secondary | ICD-10-CM | POA: Diagnosis not present

## 2023-03-27 LAB — CBC
HCT: 35.4 % — ABNORMAL LOW (ref 36.0–46.0)
Hemoglobin: 10.4 g/dL — ABNORMAL LOW (ref 12.0–15.0)
MCH: 22 pg — ABNORMAL LOW (ref 26.0–34.0)
MCHC: 29.4 g/dL — ABNORMAL LOW (ref 30.0–36.0)
MCV: 74.8 fL — ABNORMAL LOW (ref 80.0–100.0)
Platelets: 173 10*3/uL (ref 150–400)
RBC: 4.73 MIL/uL (ref 3.87–5.11)
RDW: 29.2 % — ABNORMAL HIGH (ref 11.5–15.5)
WBC: 15.6 10*3/uL — ABNORMAL HIGH (ref 4.0–10.5)
nRBC: 0 % (ref 0.0–0.2)

## 2023-03-27 LAB — BASIC METABOLIC PANEL
Anion gap: 7 (ref 5–15)
BUN: 11 mg/dL (ref 8–23)
CO2: 17 mmol/L — ABNORMAL LOW (ref 22–32)
Calcium: 7.8 mg/dL — ABNORMAL LOW (ref 8.9–10.3)
Chloride: 113 mmol/L — ABNORMAL HIGH (ref 98–111)
Creatinine, Ser: 1.02 mg/dL — ABNORMAL HIGH (ref 0.44–1.00)
GFR, Estimated: 54 mL/min — ABNORMAL LOW (ref >60)
Glucose, Bld: 177 mg/dL — ABNORMAL HIGH (ref 70–99)
Potassium: 4.7 mmol/L (ref 3.5–5.1)
Sodium: 137 mmol/L (ref 135–145)

## 2023-03-27 LAB — SURGICAL PATHOLOGY

## 2023-03-27 NOTE — Plan of Care (Signed)
°  Problem: Clinical Measurements: Goal: Will remain free from infection Outcome: Progressing   Problem: Activity: Goal: Risk for activity intolerance will decrease Outcome: Progressing   Problem: Nutrition: Goal: Adequate nutrition will be maintained Outcome: Progressing
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
## 2023-03-27 NOTE — Progress Notes (Signed)
1 Day Post-Op  Subjective: CC: Reports pain around her incision. Oob to chair this am with PT. Tolerating sips of liquids without n/v. No flatus or bm. Foley out and voiding.   Objective: Vital signs in last 24 hours: Temp:  [97.4 F (36.3 C)-98.3 F (36.8 C)] 97.8 F (36.6 C) (08/20 0812) Pulse Rate:  [69-91] 86 (08/20 0812) Resp:  [14-20] 20 (08/20 0812) BP: (112-143)/(60-98) 130/73 (08/20 0812) SpO2:  [91 %-100 %] 99 % (08/20 0812) Last BM Date : 03/25/23  Intake/Output from previous day: 08/19 0701 - 08/20 0700 In: 300 [I.V.:200; IV Piggyback:100] Out: 330 [Urine:305; Blood:25] Intake/Output this shift: No intake/output data recorded.  PE: Gen:  Alert, NAD, pleasant Pulm:  Rate and effort normal Abd: Soft, mild distension, appropriately tender, no rigidity or guarding. Midline wound honeycomb with BRB on dressing - this was removed. From the inferior aspect of the incision there was some very thin bright red drainage that appeared to be more of dependent SS drainage rather than active bleeding. No obvious active bleeding from wound edges. Pressure was held for several minutes - when gauze was removed there was no further drainage from the inferior aspect of the wound. No surrounding skin changes.   Lab Results:  Recent Labs    03/25/23 0417 03/27/23 0749  WBC 6.9 15.6*  HGB 9.0* 10.4*  HCT 30.1* 35.4*  PLT 185 173   BMET Recent Labs    03/25/23 0417 03/27/23 0427  NA 139 137  K 4.1 4.7  CL 115* 113*  CO2 19* 17*  GLUCOSE 99 177*  BUN 5* 11  CREATININE 0.84 1.02*  CALCIUM 7.9* 7.8*   PT/INR Recent Labs    03/25/23 0417  LABPROT 17.5*  INR 1.4*   CMP     Component Value Date/Time   NA 137 03/27/2023 0427   NA 143 04/06/2020 1327   K 4.7 03/27/2023 0427   CL 113 (H) 03/27/2023 0427   CO2 17 (L) 03/27/2023 0427   GLUCOSE 177 (H) 03/27/2023 0427   BUN 11 03/27/2023 0427   BUN 15 04/06/2020 1327   CREATININE 1.02 (H) 03/27/2023 0427    CREATININE 1.08 05/05/2011 1536   CALCIUM 7.8 (L) 03/27/2023 0427   PROT 4.6 (L) 03/24/2023 0422   ALBUMIN 2.2 (L) 03/24/2023 0422   AST 28 03/24/2023 0422   ALT 16 03/24/2023 0422   ALKPHOS 86 03/24/2023 0422   BILITOT 1.1 03/24/2023 0422   GFRNONAA 54 (L) 03/27/2023 0427   GFRNONAA 50 (L) 05/05/2011 1536   GFRAA 60 04/06/2020 1327   GFRAA 60 (L) 05/05/2011 1536   Lipase     Component Value Date/Time   LIPASE 29 11/22/2020 2212    Studies/Results: No results found.  Anti-infectives: Anti-infectives (From admission, onward)    Start     Dose/Rate Route Frequency Ordered Stop   03/26/23 2245  cefoTEtan (CEFOTAN) 2 g in sodium chloride 0.9 % 100 mL IVPB        2 g 200 mL/hr over 30 Minutes Intravenous Every 12 hours 03/26/23 2158 03/27/23 0056   03/26/23 0900  cefoTEtan (CEFOTAN) 2 g in sodium chloride 0.9 % 100 mL IVPB        2 g 200 mL/hr over 30 Minutes Intravenous  Once 03/25/23 1009 03/26/23 1038        Assessment/Plan POD 1 s/p Exploratory laparotomy, lysis of adhesions, right hemicolectomy with primary anastomosis by Dr. Corliss Skains for Ascending colon cancer by Dr. Corliss Skains 03/26/23 -  Colonoscopy pathology negative. Await final path from surgery. CEA 32.2 pre-op. No evidence of mets on CT CAP - Cont CLD. Await return of bowel function - Mobilize, PT - Pulm toilet   FEN - CLD. IVF per primary  VTE - SCDs, will discuss with MD about restarting anticoagulation today (Therapeutic Lovenox or Heparin gtt with no bolus) - hgb stable ID - Cefotetan peri-op, none currently.  Foley - Out POD 1, voiding  A. 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 Surgery 03/27/2023, 10:38 AM Please see Amion for pager number during day hours 7:00am-4:30pm

## 2023-03-27 NOTE — NC FL2 (Signed)
[place] MEDICAID [facility] LEVEL OF CARE FORM     IDENTIFICATION  Patient Name: [name] Birthdate: 12-Apr-1941 Sex: female Admission Date (Current Location): 03/20/2023  [facility] and [place] Number:  Producer, television/film/video and Address:  [facility],  [address] [place], [place] [address]      Provider Number: [id number]  Attending Physician Name and Address:  Dorcas Carrow, MD  Relative Name and Phone Number:       Current Level of Care: Hospital Recommended Level of Care: Skilled Nursing Facility Prior Approval Number:    Date Approved/Denied:   PASRR Number: [id number]  Discharge Plan: SNF    Current Diagnoses: Patient Active Problem List   Diagnosis Date Noted   Hypokalemia 03/22/2023   Symptomatic anemia 03/21/2023   Pulmonary emboli (HCC) 12/07/2022   Cellulitis of right leg 01/08/2021   Dehydration 01/29/2020   Shortness of breath 01/02/2020   Congestive heart failure with left ventricular diastolic dysfunction, acute (HCC) 06/03/2019   Acute on chronic diastolic CHF (congestive heart failure) (HCC) 06/03/2019   GERD (gastroesophageal reflux disease) 06/11/2018   Tachy-brady syndrome (HCC) 05/11/2015   Severe obesity (BMI >= 40) (HCC) 05/03/2015   Essential hypertension 02/03/2015   Dyspnea 02/02/2015   Chronic diastolic heart failure (HCC) 01/04/2015   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   Complete heart block (HCC) 05/09/2011   Obesity, Class III, BMI 40-49.9 (morbid obesity) (HCC) 05/09/2011   TB, KIDNEY, UNSPECIFIED 06/01/2007   PAROXYSMAL ATRIAL TACHYCARDIA 06/01/2007   Allergic rhinitis 06/01/2007   Mild persistent asthma 06/01/2007    Orientation RESPIRATION BLADDER Height & Weight     Self, Time, Situation, Place  Normal Incontinent, External catheter Weight: 189 lb (85.7 kg) Height:  5\' 6"  (167.6 cm)  BEHAVIORAL SYMPTOMS/MOOD NEUROLOGICAL BOWEL NUTRITION STATUS      Incontinent Diet (Clear Liquid)  AMBULATORY STATUS COMMUNICATION OF  NEEDS Skin   Limited Assist Verbally Other (Comment) (Closed incision Abdomen)                       Personal Care Assistance Level of Assistance  Bathing, Dressing, Feeding Bathing Assistance: Limited assistance Feeding assistance: Independent Dressing Assistance: Limited assistance     Functional Limitations Info  Sight, Speech, Hearing Sight Info: Impaired (Glasses) Hearing Info: Adequate Speech Info: Adequate    SPECIAL CARE FACTORS FREQUENCY  PT (By licensed PT), OT (By licensed OT)     PT Frequency: 5 x a week OT Frequency: 5 x a week            Contractures Contractures Info: Not present    Additional Factors Info  Code Status, Allergies Code Status Info: full Allergies Info: Albuterol           Current Medications (03/27/2023):  This is the current hospital active medication list Current Facility-Administered Medications  Medication Dose Route Frequency Provider Last Rate Last Admin   acetaminophen (TYLENOL) tablet 650 mg  650 mg Oral Q6H PRN Hugelmeyer, Alexis, DO       Or   acetaminophen (TYLENOL) suppository 650 mg  650 mg Rectal Q6H PRN Hugelmeyer, Alexis, DO       acetaminophen (TYLENOL) tablet 1,000 mg  1,000 mg Oral Q6H Manus Rudd, MD   1,000 mg at 03/27/23 1259   atorvastatin (LIPITOR) tablet 20 mg  20 mg Oral Daily Manus Rudd, MD   20 mg at 03/27/23 0900   bisacodyl (DULCOLAX) EC tablet 5 mg  5 mg Oral Daily PRN Manus Rudd,  MD       diphenhydrAMINE (BENADRYL) 12.5 MG/5ML elixir 12.5 mg  12.5 mg Oral Q6H PRN Manus Rudd, MD       Or   diphenhydrAMINE (BENADRYL) injection 12.5 mg  12.5 mg Intravenous Q6H PRN Manus Rudd, MD       feeding supplement (BOOST / RESOURCE BREEZE) liquid 1 Container  1 Container Oral TID BM Manus Rudd, MD   1 Container at 03/27/23 1259   hydrALAZINE (APRESOLINE) injection 5 mg  5 mg Intravenous Q8H PRN Manus Rudd, MD       HYDROcodone-acetaminophen (NORCO/VICODIN) 5-325 MG per tablet 1-2  tablet  1-2 tablet Oral Q4H PRN Manus Rudd, MD       ipratropium (ATROVENT) nebulizer solution 0.5 mg  0.5 mg Nebulization Q6H PRN Manus Rudd, MD       levalbuterol [facility]) inhaler 2 puff  2 puff Inhalation Q4H PRN Manus Rudd, MD       mometasone-formoterol (DULERA) 100-5 MCG/ACT inhaler 2 puff  2 puff Inhalation BID Manus Rudd, MD   2 puff at 03/27/23 0805   montelukast (SINGULAIR) tablet 10 mg  10 mg Oral QHS Manus Rudd, MD   10 mg at 03/26/23 2159   morphine (PF) 2 MG/ML injection 2-4 mg  2-4 mg Intravenous Q2H PRN Manus Rudd, MD       ondansetron [facility]) tablet 4 mg  4 mg Oral Q6H PRN Manus Rudd, MD       Or   ondansetron [facility]) injection 4 mg  4 mg Intravenous Q6H PRN Manus Rudd, MD       pantoprazole (PROTONIX) injection 40 mg  40 mg Intravenous Q12H Manus Rudd, MD   40 mg at 03/27/23 0900   senna-docusate (Senokot-S) tablet 1 tablet  1 tablet Oral QHS PRN Manus Rudd, MD       simethicone [facility]) chewable tablet 40 mg  40 mg Oral Q6H PRN Manus Rudd, MD       sodium chloride flush (NS) 0.9 % injection 3 mL  3 mL Intravenous Q12H Manus Rudd, MD   3 mL at 03/27/23 0901   traZODone (DESYREL) tablet 25 mg  25 mg Oral QHS PRN Manus Rudd, MD   25 mg at 03/21/23 2137     Discharge Medications: Please see discharge summary for a list of discharge medications.  Relevant Imaging Results:  Relevant Lab Results:   Additional Information SSN :[id number]  Valentina Shaggy Marlynn Hinckley, LCSW

## 2023-03-27 NOTE — Anesthesia Postprocedure Evaluation (Signed)
Anesthesia Post Note  Patient: [name]  Procedure(s) Performed: EXPLORATORY LAPAROTOMY, RIGHT HEMICOLECTOMY; LYSIS OF ADHESIONS     Patient location during evaluation: PACU Anesthesia Type: General Level of consciousness: sedated and patient cooperative Pain management: pain level controlled Vital Signs Assessment: post-procedure vital signs reviewed and stable Respiratory status: spontaneous breathing Cardiovascular status: stable Anesthetic complications: no  No notable events documented.  Last Vitals:  Vitals:   03/27/23 0629 03/27/23 0812  BP:  130/73  Pulse:  86  Resp:  20  Temp:  36.6 C  SpO2: 99% 99%    Last Pain:  Vitals:   03/27/23 0812  TempSrc: Oral  PainSc:                  Lewie Loron

## 2023-03-27 NOTE — TOC Initial Note (Signed)
Transition of Care [facility]) - Initial/Assessment Note    Patient Details  Name: [name] MRN: [id number] Date of Birth: 1940/09/10  [facility]) CM/SW Contact:    Larrie Kass, LCSW Phone Number: 03/27/2023, 3:45 PM  Clinical Narrative:                 CSW met with pt and son about recommendations for SNF placement. CSW discussed the difference between home health and SNF. Pt has agreed to short-term rehab and would like to know what facility will accept her insurance.CSW explained the process, pt will need insurance authorization. CSW to work pt up for SNF placement. TOC to follow.   Expected Discharge Plan: Skilled Nursing Facility Barriers to Discharge: Continued Medical Work up   Patient Goals and CMS Choice Patient states their goals for this hospitalization and ongoing recovery are:: SNF to get stronger CMS Medicare.gov Compare Post Acute Care list provided to:: Patient Choice offered to / list presented to : Patient, Adult Children      Expected Discharge Plan and Services In-house Referral: Clinical Social Work     Living arrangements for the past 2 months: Hotel/Motel                                      Prior Living Arrangements/Services Living arrangements for the past 2 months: Hotel/Motel Lives with:: Self, Other (Comment) Patient language and need for interpreter reviewed:: Yes Do you feel safe going back to the place where you live?: Yes      Need for Family Participation in Patient Care: No (Comment) Care giver support system in place?: No (comment) Current home services: DME Criminal Activity/Legal Involvement Pertinent to Current Situation/Hospitalization: No - Comment as needed  Activities of Daily Living Home Assistive Devices/Equipment: None ADL Screening (condition at time of admission) Patient's cognitive ability adequate to safely complete daily activities?: Yes Is the patient deaf or have difficulty hearing?:  No Does the patient have difficulty seeing, even when wearing glasses/contacts?: No Does the patient have difficulty concentrating, remembering, or making decisions?: No Patient able to express need for assistance with ADLs?: Yes Does the patient have difficulty dressing or bathing?: No Independently performs ADLs?: Yes (appropriate for developmental age) Does the patient have difficulty walking or climbing stairs?: No Weakness of Legs: None Weakness of Arms/Hands: None  Permission Sought/Granted                  Emotional Assessment Appearance:: Appears stated age Attitude/Demeanor/Rapport: Gracious, Engaged Affect (typically observed): Accepting Orientation: : Oriented to Self, Oriented to Place, Oriented to  Time, Oriented to Situation   Psych Involvement: No (comment)  Admission diagnosis:  SOB (shortness of breath) [R06.02] General weakness [R53.1] Symptomatic anemia [D64.9] Patient Active Problem List   Diagnosis Date Noted   Hypokalemia 03/22/2023   Symptomatic anemia 03/21/2023   Pulmonary emboli (HCC) 12/07/2022   Cellulitis of right leg 01/08/2021   Dehydration 01/29/2020   Shortness of breath 01/02/2020   Congestive heart failure with left ventricular diastolic dysfunction, acute (HCC) 06/03/2019   Acute on chronic diastolic CHF (congestive heart failure) (HCC) 06/03/2019   GERD (gastroesophageal reflux disease) 06/11/2018   Tachy-brady syndrome (HCC) 05/11/2015   Severe obesity (BMI >= 40) (HCC) 05/03/2015   Essential hypertension 02/03/2015   Dyspnea 02/02/2015   Chronic diastolic heart failure (HCC) 01/04/2015   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 [place] [address] Phone: [phone] Fax: [phone]  [facility] - [place], [place] - [address] [place] [address] Phone: [phone] Fax: [phone]     Social Determinants of Health (SDOH) Social History: SDOH Screenings   Food Insecurity: No Food Insecurity (03/21/2023)  Housing: Low Risk  (03/21/2023)  Transportation Needs: No Transportation Needs (03/21/2023)  Utilities: Not At Risk (03/21/2023)  Depression (PHQ2-9): Low Risk  (05/25/2022)  Financial Resource Strain: Low Risk  (01/19/2022)  Stress: No Stress Concern Present (01/19/2022)  Tobacco Use: Medium Risk (03/26/2023)   SDOH Interventions:     Readmission Risk Interventions    03/23/2023    3:27 PM  Readmission Risk Prevention Plan  Transportation Screening Complete  PCP or Specialist Appt within 5-7 Days Complete  Home Care Screening Complete  Medication Review (RN CM) Complete

## 2023-03-27 NOTE — Progress Notes (Signed)
PROGRESS NOTE [name]  WGN:[id number] DOB: Nov 06, 1940 DOA: 03/20/2023 PCP: Philip Aspen, Limmie Patricia, MD  Brief Narrative/Hospital Course: 82 y.o. female with known history of COPD, GERD, hypertension, hyperlipidemia and recent history of pulmonary embolism 12/2022, paroxysmal A-fib status post pacemaker presented to the ER with shortness of breath, dyspnea on exertion and wheezing along with reports of dark sticky stools.   In the ED: anemia w/ hemoglobin 7.1,admitted with acute GI bleeding, received 2 units of PRBC.  Patient was on Coumadin for paroxysmal A-fib with irregular dosing, she has been on Eliquis since the diagnosis. 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 Underwent EGD colonoscopy and found to have nearly obstructive colonic mass hemoglobin improving with 2 unit PRBC.  8/19, ex lap, extended right hemicolectomy and primary anastomosis.    Subjective:  Patient seen during morning rounds.  Mild soreness along the incision line.  No flatus or bowel movement.  Tolerating liquid diet.  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 Continue to monitor daily.EGD unremarkable, colonoscopy showed colon mass.  Status postsurgical resection. Recent Labs    12/08/22 0430 12/09/22 0351 03/21/23 1212 03/22/23 0414 03/24/23 0422 03/25/23 0417 03/27/23 0749  HGB 9.1*   < > 10.8* 9.5* 8.8* 9.0* 10.4*  MCV 78.2*   < > 73.3* 72.5* 72.8* 72.9* 74.8*  VITAMINB12  --   --  906  --   --   --   --   FOLATE  --   --  10.2  --   --   --   --   FERRITIN 8*  --  7*  --   --   --   --   TIBC 363  --  314   --   --   --   --   IRON 20*  --  19*  --   --   --   --    < > = values in this interval not displayed.    Colonic mass in the transverse colon nearly completely obstructing: Status post extended colectomy and anastomosis.  Clear liquid diet.  Mobility.  Hypokalemia and hypomagnesemia: Resolved.  Monitor intermittently Recent Labs  Lab 03/21/23 1212 03/22/23 0413 03/22/23 0414 03/24/23 0422 03/25/23 0417 03/27/23 0427  K 3.0*  --  3.3* 4.2 4.1 4.7  CALCIUM 8.1*  --  7.1* 7.8* 7.9* 7.8*  MG 1.7 1.6*  --  2.5*  --   --   PHOS  --   --   --  2.6  --   --     Metabolic acidosis: monitor bicarb/BMP.  Stable.  History of pulmonary embolism,recently diagnosed: Holding anticoagulation due to active bleeding.PE was subsegmental.   When okay with surgery, will start patient on therapeutic Lovenox and if tolerated she will go back on Eliquis.  With diagnosis of malignancy, she is at high thromboembolic state.  Will treat with anticoagulation as most possible.   Tachybradycardia syndrome status post pacemaker Chronic diastolic heart failure Euvolemic.  BNP slightly elevated but no evidence of CHF currently.  Monitor volume status.Cardiology s/oNet IO Since Admission: 6,391.27 mL [03/27/23 1532]  GERD: on Protonix  Class I Obesity Body mass index is 30.51 kg/m. : Will benefit with PCP follow-up, weight loss  healthy lifestyle and outpatient sleep evaluation.   DVT prophylaxis: SCD's Start: 03/26/23 2159 SCDs Start: 03/21/23 0911 Code Status:   Code Status: Full Code Family Communication: Patient. Patient status is: Immediate postop. Level of care: Telemetry , can DC.  Dispo: The patient is from: Home            Anticipated disposition: Home once surgically stable. Objective: Vitals last 24 hrs: Vitals:   03/27/23 0039 03/27/23 0601 03/27/23 0629 03/27/23 0812  BP: 128/70 137/60  130/73  Pulse: 84 70  86  Resp: 18 17  20   Temp: 97.7 F (36.5 C) 98.3 F (36.8 C)  97.8 F  (36.6 C)  TempSrc: Oral Oral  Oral  SpO2: 100% 100% 99% 99%  Weight:      Height:       Weight change:   Physical Examination: Looks comfortable. Midline incision clean and dry.  Abdomen is soft with expected tenderness along the incision line.  Bowel sound present.  Medications reviewed:  Scheduled Meds:  acetaminophen  1,000 mg Oral Q6H   atorvastatin  20 mg Oral Daily   feeding supplement  1 Container Oral TID BM   mometasone-formoterol  2 puff Inhalation BID   montelukast  10 mg Oral QHS   pantoprazole (PROTONIX) IV  40 mg Intravenous Q12H   sodium chloride flush  3 mL Intravenous Q12H   Continuous Infusions:      Diet Order             Diet clear liquid Room service appropriate? Yes; Fluid consistency: Thin  Diet effective now                   Intake/Output Summary (Last 24 hours) at 03/27/2023 1532 Last data filed at 03/27/2023 0600 Gross per 24 hour  Intake --  Output 275 ml  Net -275 ml   Net IO Since Admission: 6,391.27 mL [03/27/23 1532]  Wt Readings from Last 3 Encounters:  03/20/23 85.7 kg  02/07/23 94.8 kg  12/12/22 99.6 kg     Unresulted Labs (From admission, onward)     Start     Ordered   03/27/23 0500  CBC  Daily,   R     Question:  Specimen collection method  Answer:  Lab=Lab collect   03/26/23 2158   03/27/23 0500  Basic metabolic panel  Daily,   R     Question:  Specimen collection method  Answer:  Lab=Lab collect   03/26/23 2158   03/21/23 0911  Occult blood card to lab, stool  Once,   R        03/21/23 0910          Data Reviewed: I have personally reviewed following labs and imaging studies CBC: Recent Labs  Lab 03/20/23 2316 03/21/23 1212 03/22/23 0414 03/24/23 0422 03/25/23 0417 03/27/23 0749  WBC 3.9* 6.6 5.4 5.3 6.9 15.6*  NEUTROABS 2.2  --   --  3.3  --   --   HGB 7.1* 10.8* 9.5* 8.8* 9.0* 10.4*  HCT 24.6* 36.5 31.7* 29.2* 30.1* 35.4*  MCV 68.1* 73.3* 72.5* 72.8* 72.9* 74.8*  PLT 229 218 200 191 185 173    Basic Metabolic Panel: Recent Labs  Lab 03/21/23 1212 03/22/23 0413 03/22/23 0414 03/24/23 0422 03/25/23 0417 03/27/23 0427  NA 137  --  138 141 139 137  K 3.0*  --  3.3* 4.2 4.1 4.7  CL 108  --  113* 119* 115* 113*  CO2 19*  --  19* 18* 19* 17*  GLUCOSE 103*  --  89 76 99 177*  BUN 10  --  7* <5* 5* 11  CREATININE 0.76  --  0.73 0.82 0.84 1.02*  CALCIUM 8.1*  --  7.1* 7.8* 7.9* 7.8*  MG 1.7 1.6*  --  2.5*  --   --   PHOS  --   --   --  2.6  --   --    GFR: Estimated Creatinine Clearance: 45.3 mL/min (A) (by C-G formula based on SCr of 1.02 mg/dL (H)). Liver Function Tests: Recent Labs  Lab 03/22/23 0414 03/24/23 0422  AST 24 28  ALT 14 16  ALKPHOS 80 86  BILITOT 1.4* 1.1  PROT 4.5* 4.6*  ALBUMIN 2.2* 2.2*   Recent Results (from the past 240 hour(s))  SARS Coronavirus 2 by RT PCR (hospital order, performed in [facility] hospital lab) *cepheid single result test* Anterior Nasal Swab     Status: None   Collection Time: 03/20/23 11:31 PM   Specimen: Anterior Nasal Swab  Result Value Ref Range Status   SARS Coronavirus 2 by RT PCR NEGATIVE NEGATIVE Final    Comment: (NOTE) SARS-CoV-2 target nucleic acids are NOT DETECTED.  The SARS-CoV-2 RNA is generally detectable in upper and lower respiratory specimens during the acute phase of infection. The lowest concentration of SARS-CoV-2 viral copies this assay can detect is 250 copies / mL. A negative result does not preclude SARS-CoV-2 infection and should not be used as the sole basis for treatment or other patient management decisions.  A negative result may occur with improper specimen collection / handling, submission of specimen other than nasopharyngeal swab, presence of viral mutation(s) within the areas targeted by this assay, and inadequate number of viral copies (<250 copies / mL). A negative result must be combined with clinical observations, patient history, and epidemiological information.  Fact Sheet  for Patients:   RoadLapTop.co.za  Fact Sheet for Healthcare Providers: http://kim-miller.com/  This test is not yet approved or  cleared by the [place] FDA and has been authorized for detection and/or diagnosis of SARS-CoV-2 by FDA under an Emergency Use Authorization (EUA).  This EUA will remain in effect (meaning this test can be used) for the duration of the COVID-19 declaration under Section 564(b)(1) of the Act, 21 U.S.C. section 360bbb-3(b)(1), unless the authorization is terminated or revoked sooner.  Performed at [facility] [address], [place], [place] [address]     Antimicrobials: Anti-infectives (From admission, onward)    Start     Dose/Rate Route Frequency Ordered Stop   03/26/23 2245  cefoTEtan (CEFOTAN) 2 g in sodium chloride 0.9 % 100 mL IVPB        2 g 200 mL/hr over 30 Minutes Intravenous Every 12 hours 03/26/23 2158 03/27/23 0056   03/26/23 0900  cefoTEtan (CEFOTAN) 2 g in sodium chloride 0.9 % 100 mL IVPB        2 g 200 mL/hr over 30 Minutes Intravenous  Once 03/25/23 1009 03/26/23 1038      Culture/Microbiology No results found for: "SDES", "SPECREQUEST", "CULT", "REPTSTATUS"   Radiology Studies: No results found.   LOS: 5 days   Dorcas Carrow, MD [facility]  03/27/2023, 3:32 PM

## 2023-03-27 NOTE — Care Management Important Message (Signed)
Important Message  Patient Details IM Letter given. Name: [name] MRN: [id number] Date of Birth: 15-May-1941   Medicare Important Message Given:  Yes     Caren Macadam 03/27/2023, 11:10 AM

## 2023-03-28 ENCOUNTER — Other Ambulatory Visit: Payer: Self-pay

## 2023-03-28 DIAGNOSIS — D649 Anemia, unspecified: Secondary | ICD-10-CM | POA: Diagnosis not present

## 2023-03-28 LAB — CBC
HCT: 31.5 % — ABNORMAL LOW (ref 36.0–46.0)
Hemoglobin: 9.5 g/dL — ABNORMAL LOW (ref 12.0–15.0)
MCH: 22 pg — ABNORMAL LOW (ref 26.0–34.0)
MCHC: 30.2 g/dL (ref 30.0–36.0)
MCV: 73.1 fL — ABNORMAL LOW (ref 80.0–100.0)
Platelets: 172 10*3/uL (ref 150–400)
RBC: 4.31 MIL/uL (ref 3.87–5.11)
RDW: 29.9 % — ABNORMAL HIGH (ref 11.5–15.5)
WBC: 12.7 10*3/uL — ABNORMAL HIGH (ref 4.0–10.5)
nRBC: 0 % (ref 0.0–0.2)

## 2023-03-28 LAB — BASIC METABOLIC PANEL
Anion gap: 8 (ref 5–15)
BUN: 15 mg/dL (ref 8–23)
CO2: 20 mmol/L — ABNORMAL LOW (ref 22–32)
Calcium: 8 mg/dL — ABNORMAL LOW (ref 8.9–10.3)
Chloride: 108 mmol/L (ref 98–111)
Creatinine, Ser: 0.8 mg/dL (ref 0.44–1.00)
GFR, Estimated: >60 mL/min (ref >60)
Glucose, Bld: 123 mg/dL — ABNORMAL HIGH (ref 70–99)
Potassium: 3.7 mmol/L (ref 3.5–5.1)
Sodium: 136 mmol/L (ref 135–145)

## 2023-03-28 MED ORDER — APIXABAN 5 MG PO TABS
5.0000 mg | ORAL_TABLET | Freq: Two times a day (BID) | ORAL | Status: DC
  Administered 2023-03-28 – 2023-03-30 (×5): 5 mg via ORAL
  Filled 2023-03-28 (×5): qty 1

## 2023-03-28 NOTE — Progress Notes (Signed)
PROGRESS NOTE [name]  GNF:[id number] DOB: [birth year] DOA: 03/20/2023 PCP: Philip Aspen, Limmie Patricia, MD  Brief Narrative/Hospital Course: 82 y.o. female with known history of COPD, GERD, hypertension, hyperlipidemia and recent history of pulmonary embolism 12/2022, paroxysmal A-fib status post pacemaker presented to the ER with shortness of breath, dyspnea on exertion and wheezing along with reports of dark sticky stools.   In the ED: anemia w/ hemoglobin 7.1,admitted with acute GI bleeding, received 2 units of PRBC.  Patient was on Coumadin for paroxysmal A-fib with irregular dosing, she has been on Eliquis since the diagnosis. 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 Underwent EGD colonoscopy and found to have nearly obstructive colonic mass hemoglobin improving with 2 unit PRBC.  8/19, ex lap, extended right hemicolectomy and primary anastomosis.    Subjective:  Patient seen during morning rounds.  Son at the bedside.  Sitting in couch.  Has some soreness and rumbling.  No bowel movement or flatus yet.  Denies any nausea vomiting.  On clear liquid diet.  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 Continue to monitor daily.EGD unremarkable, colonoscopy showed colon mass.  Status postsurgical resection.   Colonic mass in the transverse colon nearly completely obstructing: Status post extended colectomy and anastomosis.  Clear liquid diet.  Mobility. Colonoscopy biopsy with no malignancy.  CT scans of the abdomen pelvis with no evidence of metastatic lesion.  Surgical pathology pending. Hypokalemia and  hypomagnesemia: Resolved.  Monitor intermittently   Metabolic acidosis: monitor bicarb/BMP.  Stable.  History of pulmonary embolism,recently diagnosed: Anticoagulation was on hold due to active bleeding.   Surgically cleared to start on anticoagulation.  Will start Eliquis today.  With diagnosis of malignancy, she is at high thromboembolic state.  Will treat with anticoagulation as most possible.   Tachybradycardia syndrome status post pacemaker Chronic diastolic heart failure Euvolemic.  BNP slightly elevated but no evidence of CHF currently.  Monitor volume status.Cardiology s/oNet IO Since Admission: 6,631.27 mL [03/28/23 1442]   GERD: on Protonix  Class I Obesity Body mass index is 30.51 kg/m. : Will benefit with PCP follow-up, weight loss  healthy lifestyle and outpatient sleep evaluation.   DVT prophylaxis: SCD's Start: 03/26/23 2159 SCDs Start: 03/21/23 0911 Code Status:   Code Status: Full Code Family Communication: Patient's son at the bedside. Patient status is: Postop recovery. Level of care: Med-Surg   Dispo: The patient is from: Home            Anticipated disposition: Home once surgically stable.  She is expecting to go to a SNF, however with improved mobility may go home. Objective: Vitals last 24 hrs: Vitals:   03/27/23 2031 03/28/23 0543 03/28/23 0819 03/28/23 1300  BP: 119/67 137/77  126/71  Pulse: 78 71  77  Resp: 17 17  20   Temp: (!) 97.5 F (36.4 C) 98.5 F (36.9 C)  97.7 F (36.5 C)  TempSrc: Oral Oral  Oral  SpO2: 98% 100% 99% 100%  Weight:      Height:       Weight change:   Physical Examination: Looks comfortable.  Sitting in couch. Midline incision  clean and dry.  Mild redness on the lower aspect of the incision line.  Abdomen is soft with expected tenderness along the incision line.  Bowel sound present.  Medications reviewed:  Scheduled Meds:  acetaminophen  1,000 mg Oral Q6H   apixaban  5 mg Oral BID   atorvastatin  20 mg Oral Daily    feeding supplement  1 Container Oral TID BM   mometasone-formoterol  2 puff Inhalation BID   montelukast  10 mg Oral QHS   pantoprazole (PROTONIX) IV  40 mg Intravenous Q12H   sodium chloride flush  3 mL Intravenous Q12H   Continuous Infusions:      Diet Order             Diet clear liquid Room service appropriate? Yes; Fluid consistency: Thin  Diet effective now                   Intake/Output Summary (Last 24 hours) at 03/28/2023 1442 Last data filed at 03/27/2023 1645 Gross per 24 hour  Intake 240 ml  Output --  Net 240 ml   Net IO Since Admission: 6,631.27 mL [03/28/23 1442]  Wt Readings from Last 3 Encounters:  03/20/23 85.7 kg  02/07/23 94.8 kg  12/12/22 99.6 kg     Unresulted Labs (From admission, onward)    None     Data Reviewed: I have personally reviewed following labs and imaging studies CBC: Recent Labs  Lab 03/22/23 0414 03/24/23 0422 03/25/23 0417 03/27/23 0749 03/28/23 0639  WBC 5.4 5.3 6.9 15.6* 12.7*  NEUTROABS  --  3.3  --   --   --   HGB 9.5* 8.8* 9.0* 10.4* 9.5*  HCT 31.7* 29.2* 30.1* 35.4* 31.5*  MCV 72.5* 72.8* 72.9* 74.8* 73.1*  PLT 200 191 185 173 172   Basic Metabolic Panel: Recent Labs  Lab 03/22/23 0413 03/22/23 0414 03/24/23 0422 03/25/23 0417 03/27/23 0427 03/28/23 0639  NA  --  138 141 139 137 136  K  --  3.3* 4.2 4.1 4.7 3.7  CL  --  113* 119* 115* 113* 108  CO2  --  19* 18* 19* 17* 20*  GLUCOSE  --  89 76 99 177* 123*  BUN  --  7* <5* 5* 11 15  CREATININE  --  0.73 0.82 0.84 1.02* 0.80  CALCIUM  --  7.1* 7.8* 7.9* 7.8* 8.0*  MG 1.6*  --  2.5*  --   --   --   PHOS  --   --  2.6  --   --   --    GFR: Estimated Creatinine Clearance: 57.8 mL/min (by C-G formula based on SCr of 0.8 mg/dL). Liver Function Tests: Recent Labs  Lab 03/22/23 0414 03/24/23 0422  AST 24 28  ALT 14 16  ALKPHOS 80 86  BILITOT 1.4* 1.1  PROT 4.5* 4.6*  ALBUMIN 2.2* 2.2*   Recent Results (from the past 240 hour(s))  SARS  Coronavirus 2 by RT PCR (hospital order, performed in [facility] hospital lab) *cepheid single result test* Anterior Nasal Swab     Status: None   Collection Time: 03/20/23 11:31 PM   Specimen: Anterior Nasal Swab  Result Value Ref Range Status   SARS Coronavirus 2 by RT PCR NEGATIVE NEGATIVE Final    Comment: (NOTE) SARS-CoV-2 target nucleic acids are NOT DETECTED.  The SARS-CoV-2 RNA is generally detectable in upper and lower respiratory specimens during the acute phase of infection. The lowest concentration  of SARS-CoV-2 viral copies this assay can detect is 250 copies / mL. A negative result does not preclude SARS-CoV-2 infection and should not be used as the sole basis for treatment or other patient management decisions.  A negative result may occur with improper specimen collection / handling, submission of specimen other than nasopharyngeal swab, presence of viral mutation(s) within the areas targeted by this assay, and inadequate number of viral copies (<250 copies / mL). A negative result must be combined with clinical observations, patient history, and epidemiological information.  Fact Sheet for Patients:   RoadLapTop.co.za  Fact Sheet for Healthcare Providers: http://kim-miller.com/  This test is not yet approved or  cleared by the [place] FDA and has been authorized for detection and/or diagnosis of SARS-CoV-2 by FDA under an Emergency Use Authorization (EUA).  This EUA will remain in effect (meaning this test can be used) for the duration of the COVID-19 declaration under Section 564(b)(1) of the Act, 21 U.S.C. section 360bbb-3(b)(1), unless the authorization is terminated or revoked sooner.  Performed at [facility], [address], [place], [place] [address]     Antimicrobials: Anti-infectives (From admission, onward)    Start     Dose/Rate Route Frequency Ordered Stop   03/26/23 2245   cefoTEtan (CEFOTAN) 2 g in sodium chloride 0.9 % 100 mL IVPB        2 g 200 mL/hr over 30 Minutes Intravenous Every 12 hours 03/26/23 2158 03/27/23 0056   03/26/23 0900  cefoTEtan (CEFOTAN) 2 g in sodium chloride 0.9 % 100 mL IVPB        2 g 200 mL/hr over 30 Minutes Intravenous  Once 03/25/23 1009 03/26/23 1038      Culture/Microbiology No results found for: "SDES", "SPECREQUEST", "CULT", "REPTSTATUS"   Radiology Studies: No results found.   LOS: 6 days   Total time spent: 35 minutes  Dorcas Carrow, MD [facility]  03/28/2023, 2:42 PM

## 2023-03-28 NOTE — Plan of Care (Signed)
  Problem: Activity: Goal: Risk for activity intolerance will decrease Outcome: Progressing   Problem: Nutrition: Goal: Adequate nutrition will be maintained Outcome: Progressing   Problem: Coping: Goal: Level of anxiety will decrease Outcome: Progressing   Problem: Elimination: Goal: Will not experience complications related to bowel motility Outcome: Progressing   Problem: Elimination: Goal: Will not experience complications related to urinary retention Outcome: Progressing   Problem: Pain Managment: Goal: General experience of comfort will improve Outcome: Progressing   Problem: Safety: Goal: Ability to remain free from injury will improve Outcome: Progressing   
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
## 2023-03-28 NOTE — Progress Notes (Signed)
Mobility Specialist - Progress Note   03/28/23 0851  Mobility  Activity Ambulated with assistance in hallway  Level of Assistance Moderate assist, patient does 50-74%  Assistive Device Front wheel walker  Distance Ambulated (ft) 250 ft  Range of Motion/Exercises Active Assistive  Activity Response Tolerated well  Mobility Referral Yes  $Mobility charge 1 Mobility  Mobility Specialist Start Time (ACUTE ONLY) 0820  Mobility Specialist Stop Time (ACUTE ONLY) 0850  Mobility Specialist Time Calculation (min) (ACUTE ONLY) 30 min   Pt was found in bed and agreeable to ambulate. Was mod-A for bed mobility and STS. Once standing had the urge to use BSC. C/o abdominal soreness with movement. At EOS returned to recliner chair with call bell in reach. Son in room and RN notified.  Billey Chang Mobility Specialist

## 2023-03-28 NOTE — TOC Progression Note (Signed)
Transition of Care [facility]) - Progression Note    Patient Details  Name: [name] MRN: [id number] Date of Birth: August 22, 1940  Transition of Care [facility]) CM/SW Contact  Larrie Kass, LCSW Phone Number: 03/28/2023, 10:53 AM  Clinical Narrative:    Per chart review pt has walked 250 ft, pt does not meet SNF requirements. CSW inquired if PT can see pt again today. TOC to follow.    Expected Discharge Plan: Skilled Nursing Facility Barriers to Discharge: Continued Medical Work up  Expected Discharge Plan and Services In-house Referral: Clinical Social Work     Living arrangements for the past 2 months: Hotel/Motel                                       Social Determinants of Health (SDOH) Interventions SDOH Screenings   Food Insecurity: No Food Insecurity (03/21/2023)  Housing: Low Risk  (03/21/2023)  Transportation Needs: No Transportation Needs (03/21/2023)  Utilities: Not At Risk (03/21/2023)  Depression (PHQ2-9): Low Risk  (05/25/2022)  Financial Resource Strain: Low Risk  (01/19/2022)  Stress: No Stress Concern Present (01/19/2022)  Tobacco Use: Medium Risk (03/26/2023)    Readmission Risk Interventions    03/23/2023    3:27 PM  Readmission Risk Prevention Plan  Transportation Screening Complete  PCP or Specialist Appt within 5-7 Days Complete  Home Care Screening Complete  Medication Review (RN CM) Complete
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
## 2023-03-28 NOTE — Progress Notes (Signed)
2 Days Post-Op   Subjective/Chief Complaint: Patient feels some "rumbling" in her abdomen, but no flatus yet Pain well-controlled Tolerating clears without nausea    Objective: Vital signs in last 24 hours: Temp:  [97.5 F (36.4 C)-98.5 F (36.9 C)] 98.5 F (36.9 C) (08/21 0543) Pulse Rate:  [71-86] 71 (08/21 0543) Resp:  [17-20] 17 (08/21 0543) BP: (119-137)/(67-77) 137/77 (08/21 0543) SpO2:  [95 %-100 %] 100 % (08/21 0543) FiO2 (%):  [21 %] 21 % (08/21 0010) Last BM Date : 03/25/23  Intake/Output from previous day: 08/20 0701 - 08/21 0700 In: 240 [P.O.:240] Out: -  Intake/Output this shift: No intake/output data recorded.  Gen:  Alert, NAD, pleasant Pulm:  Rate and effort normal Abd: Soft, mild distension, appropriately tender, no rigidity or guarding.  Staple line intact; minimal serous drainage from lower part of incision; no sign of infection  Lab Results:  Recent Labs    03/27/23 0749 03/28/23 0639  WBC 15.6* 12.7*  HGB 10.4* 9.5*  HCT 35.4* 31.5*  PLT 173 172   BMET Recent Labs    03/27/23 0427 03/28/23 0639  NA 137 136  K 4.7 3.7  CL 113* 108  CO2 17* 20*  GLUCOSE 177* 123*  BUN 11 15  CREATININE 1.02* 0.80  CALCIUM 7.8* 8.0*   PT/INR No results for input(s): "LABPROT", "INR" in the last 72 hours. ABG No results for input(s): "PHART", "HCO3" in the last 72 hours.  Invalid input(s): "PCO2", "PO2"  Studies/Results: No results found.  Anti-infectives: Anti-infectives (From admission, onward)    Start     Dose/Rate Route Frequency Ordered Stop   03/26/23 2245  cefoTEtan (CEFOTAN) 2 g in sodium chloride 0.9 % 100 mL IVPB        2 g 200 mL/hr over 30 Minutes Intravenous Every 12 hours 03/26/23 2158 03/27/23 0056   03/26/23 0900  cefoTEtan (CEFOTAN) 2 g in sodium chloride 0.9 % 100 mL IVPB        2 g 200 mL/hr over 30 Minutes Intravenous  Once 03/25/23 1009 03/26/23 1038       Assessment/Plan: POD 2 s/p Exploratory laparotomy, lysis  of adhesions, right hemicolectomy with primary anastomosis by Dr. Corliss Skains for Ascending colon cancer by Dr. Corliss Skains 03/26/23 - Colonoscopy pathology negative. Await final path from surgery. CEA 32.2 pre-op. No evidence of mets on CT CAP - Cont CLD. Await return of bowel function - beginning to have bowel sounds - Mobilize, PT - Pulm toilet    FEN - CLD. IVF per primary  VTE - SCDs, OK to restart anticoagulation ID - Cefotetan peri-op, none currently.  Foley - Out POD 1, voiding   A. 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

## 2023-03-29 DIAGNOSIS — D649 Anemia, unspecified: Secondary | ICD-10-CM | POA: Diagnosis not present

## 2023-03-29 LAB — CBC
HCT: 29.5 % — ABNORMAL LOW (ref 36.0–46.0)
Hemoglobin: 9 g/dL — ABNORMAL LOW (ref 12.0–15.0)
MCH: 22.4 pg — ABNORMAL LOW (ref 26.0–34.0)
MCHC: 30.5 g/dL (ref 30.0–36.0)
MCV: 73.4 fL — ABNORMAL LOW (ref 80.0–100.0)
Platelets: 167 10*3/uL (ref 150–400)
RBC: 4.02 MIL/uL (ref 3.87–5.11)
RDW: 30.5 % — ABNORMAL HIGH (ref 11.5–15.5)
WBC: 9.3 10*3/uL (ref 4.0–10.5)
nRBC: 0 % (ref 0.0–0.2)

## 2023-03-29 LAB — BASIC METABOLIC PANEL
Anion gap: 7 (ref 5–15)
BUN: 15 mg/dL (ref 8–23)
CO2: 18 mmol/L — ABNORMAL LOW (ref 22–32)
Calcium: 7.9 mg/dL — ABNORMAL LOW (ref 8.9–10.3)
Chloride: 111 mmol/L (ref 98–111)
Creatinine, Ser: 0.89 mg/dL (ref 0.44–1.00)
GFR, Estimated: >60 mL/min (ref >60)
Glucose, Bld: 125 mg/dL — ABNORMAL HIGH (ref 70–99)
Potassium: 3.6 mmol/L (ref 3.5–5.1)
Sodium: 136 mmol/L (ref 135–145)

## 2023-03-29 MED ORDER — DOCUSATE SODIUM 100 MG PO CAPS
100.0000 mg | ORAL_CAPSULE | Freq: Two times a day (BID) | ORAL | Status: DC
  Administered 2023-03-29 – 2023-04-06 (×13): 100 mg via ORAL
  Filled 2023-03-29 (×13): qty 1

## 2023-03-29 NOTE — Progress Notes (Signed)
Mobility Specialist - Progress Note   03/29/23 1216  Mobility  Activity Ambulated with assistance in hallway  Level of Assistance Minimal assist, patient does 75% or more  Assistive Device Front wheel walker  Distance Ambulated (ft) 250 ft  Range of Motion/Exercises Active  Activity Response Tolerated well  Mobility Referral Yes  $Mobility charge 1 Mobility  Mobility Specialist Start Time (ACUTE ONLY) 1150  Mobility Specialist Stop Time (ACUTE ONLY) 1215  Mobility Specialist Time Calculation (min) (ACUTE ONLY) 25 min   Pt was found on recliner chair and agreeable to ambulate. Grew fatigued with session and c/o some wheezing. At EOS returned to recliner chair with all needs met. Call bell in reach and RN notified.  Billey Chang Mobility Specialist

## 2023-03-29 NOTE — Progress Notes (Signed)
Patient ID: [name], female   DOB: 05/17/1941, 82 y.o.   MRN: [id number] [facility] Surgery Progress Note  3 Days Post-Op  Subjective: CC-  Family at bedside. Continues to feel better each day. Tolerating clear liquids without nausea or vomiting. Passing some flatus and had a very small smear of a BM this morning. Only using tylenol for pain.  Objective: Vital signs in last 24 hours: Temp:  [97.7 F (36.5 C)-98.2 F (36.8 C)] 98.1 F (36.7 C) (08/22 0449) Pulse Rate:  [71-81] 71 (08/22 0449) Resp:  [16-20] 16 (08/22 0449) BP: (112-126)/(63-71) 112/63 (08/22 0449) SpO2:  [98 %-100 %] 98 % (08/22 0753) Last BM Date : 03/25/23  Intake/Output from previous day: 08/21 0701 - 08/22 0700 In: 3 [I.V.:3] Out: -  Intake/Output this shift: No intake/output data recorded.  PE: Gen:  Alert, NAD, pleasant Abd: soft, minimal distension, mild right sided tenderness, incision cdi with staples present and no erythema or drainage  Lab Results:  Recent Labs    03/27/23 0749 03/28/23 0639  WBC 15.6* 12.7*  HGB 10.4* 9.5*  HCT 35.4* 31.5*  PLT 173 172   BMET Recent Labs    03/27/23 0427 03/28/23 0639  NA 137 136  K 4.7 3.7  CL 113* 108  CO2 17* 20*  GLUCOSE 177* 123*  BUN 11 15  CREATININE 1.02* 0.80  CALCIUM 7.8* 8.0*   PT/INR No results for input(s): "LABPROT", "INR" in the last 72 hours. CMP     Component Value Date/Time   NA 136 03/28/2023 0639   NA 143 04/06/2020 1327   K 3.7 03/28/2023 0639   CL 108 03/28/2023 0639   CO2 20 (L) 03/28/2023 0639   GLUCOSE 123 (H) 03/28/2023 0639   BUN 15 03/28/2023 0639   BUN 15 04/06/2020 1327   CREATININE 0.80 03/28/2023 0639   CREATININE 1.08 05/05/2011 1536   CALCIUM 8.0 (L) 03/28/2023 0639   PROT 4.6 (L) 03/24/2023 0422   ALBUMIN 2.2 (L) 03/24/2023 0422   AST 28 03/24/2023 0422   ALT 16 03/24/2023 0422   ALKPHOS 86 03/24/2023 0422   BILITOT 1.1 03/24/2023 0422   GFRNONAA >60 03/28/2023 0639   GFRNONAA 50 (L)  05/05/2011 1536   GFRAA 60 04/06/2020 1327   GFRAA 60 (L) 05/05/2011 1536   Lipase     Component Value Date/Time   LIPASE 29 11/22/2020 2212       Studies/Results: No results found.  Anti-infectives: Anti-infectives (From admission, onward)    Start     Dose/Rate Route Frequency Ordered Stop   03/26/23 2245  cefoTEtan (CEFOTAN) 2 g in sodium chloride 0.9 % 100 mL IVPB        2 g 200 mL/hr over 30 Minutes Intravenous Every 12 hours 03/26/23 2158 03/27/23 0056   03/26/23 0900  cefoTEtan (CEFOTAN) 2 g in sodium chloride 0.9 % 100 mL IVPB        2 g 200 mL/hr over 30 Minutes Intravenous  Once 03/25/23 1009 03/26/23 1038        Assessment/Plan POD#3 s/p Exploratory laparotomy, lysis of adhesions, right hemicolectomy with primary anastomosis by Dr. Corliss Skains for Ascending colon cancer by Dr. 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 Advance to FLD - Mobilize, PT - recommend HH PT - Pulm toilet    FEN - FLD. Boost. IVF per primary  VTE - SCDs, eliquis ID - Cefotetan peri-op, none currently.  Foley - Out POD 1, voiding   A. 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 03/29/2023, 9:52 AM Please see Amion for pager number during day hours 7:00am-4:30pm

## 2023-03-29 NOTE — Plan of Care (Signed)
  Problem: Health Behavior/Discharge Planning: Goal: Ability to manage health-related needs will improve Outcome: Progressing   Problem: Nutrition: Goal: Adequate nutrition will be maintained Outcome: Progressing   Problem: Elimination: Goal: Will not experience complications related to bowel motility Outcome: Progressing   Problem: Skin Integrity: Goal: Risk for impaired skin integrity will decrease Outcome: Progressing   Problem: Education: Goal: Knowledge of General Education information will improve Description: Including pain rating scale, medication(s)/side effects and non-pharmacologic comfort measures Outcome: Adequate for Discharge   Problem: Clinical Measurements: Goal: Will remain free from infection Outcome: Adequate for Discharge Goal: Cardiovascular complication will be avoided Outcome: Adequate for Discharge   Problem: Coping: Goal: Level of anxiety will decrease Outcome: Adequate for Discharge   Problem: Elimination: Goal: Will not experience complications related to urinary retention Outcome: Adequate for Discharge   Problem: Pain Managment: Goal: General experience of comfort will improve Outcome: Adequate for Discharge   Problem: Safety: Goal: Ability to remain free from injury will improve Outcome: Adequate for Discharge

## 2023-03-29 NOTE — Plan of Care (Signed)

## 2023-03-29 NOTE — Progress Notes (Signed)
PROGRESS NOTE [name]  EAV:[id number] DOB: [birth year] DOA: 03/20/2023 PCP: Philip Aspen, Limmie Patricia, MD  Brief Narrative/Hospital Course: 82 y.o. female with known history of COPD, GERD, hypertension, hyperlipidemia and recent history of pulmonary embolism 12/2022, paroxysmal A-fib status post pacemaker presented to the ER with shortness of breath, dyspnea on exertion and wheezing along with reports of dark sticky stools.   In the ED: anemia w/ hemoglobin 7.1,admitted with acute GI bleeding, received 2 units of PRBC.  Patient was on Coumadin for paroxysmal A-fib with irregular dosing, she has been on Eliquis since the diagnosis. 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 Underwent EGD colonoscopy and found to have nearly obstructive colonic mass hemoglobin improving with 2 unit PRBC.  8/19, ex lap, extended right hemicolectomy and primary anastomosis.   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  Margins negative.  Subjective:  Seen in the morning rounds.  Son was at the bedside.  Mild soreness.  Denies any nausea vomiting.  Tolerating clears.  She had 1 small bowel movement last night.  She will be going home once tolerating diet with home health therapies.  Surgery updated them about pathology results.  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  Hemoglobin stable.  Had unremarkable EGD.  Colonoscopy with colon mass and ultimately underwent extended colectomy and anastomosis. Full liquid diet today.  Continue mobility. Found to have invasive adenocarcinoma, free margins, free lymph nodes.  Surgery  following.  Hypokalemia and hypomagnesemia: Resolved.  Monitor intermittently   Metabolic acidosis: monitor bicarb/BMP.  Stable.  History of pulmonary embolism,recently diagnosed: Anticoagulation was on hold due to active bleeding.   Surgically cleared to start on anticoagulation.  Tolerating Eliquis.  Tachybradycardia syndrome status post pacemaker Chronic diastolic heart failure Euvolemic.  BNP slightly elevated but no evidence of CHF currently.  Monitor volume status.Cardiology s/oNet IO Since Admission: 6,994.27 mL [03/29/23 1158]   GERD: on Protonix  Class I Obesity Body mass index is 30.51 kg/m. :    DVT prophylaxis: SCD's Start: 03/26/23 2159 SCDs Start: 03/21/23 0911 Code Status:   Code Status: Full Code Family Communication: Patient's son at the bedside. Patient status is: Postop recovery. Level of care: Med-Surg   Dispo: The patient is from: Home            Anticipated disposition: Home once surgically stable.   Objective: Vitals last 24 hrs: Vitals:   03/28/23 1951 03/28/23 2043 03/29/23 0449 03/29/23 0753  BP:  122/63 112/63   Pulse:  81 71   Resp:  18 16   Temp:  98.2 F (36.8 C) 98.1 F (36.7 C)   TempSrc:  Oral Oral   SpO2: 100% 99% 99% 98%  Weight:      Height:       Weight change:   Physical Examination: Looks comfortable.  Laying in bed.  On room air. Midline incision clean and dry.  Abdomen is soft with expected tenderness along the incision line.  Bowel sound present. No edema.  No cyanosis. Chest exam: Bilateral clear.  Medications reviewed:  Scheduled Meds:  acetaminophen  1,000 mg Oral Q6H   apixaban  5 mg Oral BID  atorvastatin  20 mg Oral Daily   docusate sodium  100 mg Oral BID   feeding supplement  1 Container Oral TID BM   mometasone-formoterol  2 puff Inhalation BID   montelukast  10 mg Oral QHS   pantoprazole (PROTONIX) IV  40 mg Intravenous Q12H   sodium chloride flush  3 mL Intravenous Q12H   Continuous  Infusions:      Diet Order             Diet full liquid Fluid consistency: Thin  Diet effective now                   Intake/Output Summary (Last 24 hours) at 03/29/2023 1158 Last data filed at 03/29/2023 1000 Gross per 24 hour  Intake 363 ml  Output --  Net 363 ml   Net IO Since Admission: 6,994.27 mL [03/29/23 1158]  Wt Readings from Last 3 Encounters:  03/20/23 85.7 kg  02/07/23 94.8 kg  12/12/22 99.6 kg     Unresulted Labs (From admission, onward)    None     Data Reviewed: I have personally reviewed following labs and imaging studies CBC: Recent Labs  Lab 03/24/23 0422 03/25/23 0417 03/27/23 0749 03/28/23 0639 03/29/23 1102  WBC 5.3 6.9 15.6* 12.7* 9.3  NEUTROABS 3.3  --   --   --   --   HGB 8.8* 9.0* 10.4* 9.5* 9.0*  HCT 29.2* 30.1* 35.4* 31.5* 29.5*  MCV 72.8* 72.9* 74.8* 73.1* 73.4*  PLT 191 185 173 172 167   Basic Metabolic Panel: Recent Labs  Lab 03/24/23 0422 03/25/23 0417 03/27/23 0427 03/28/23 0639 03/29/23 1102  NA 141 139 137 136 136  K 4.2 4.1 4.7 3.7 3.6  CL 119* 115* 113* 108 111  CO2 18* 19* 17* 20* 18*  GLUCOSE 76 99 177* 123* 125*  BUN <5* 5* 11 15 15   CREATININE 0.82 0.84 1.02* 0.80 0.89  CALCIUM 7.8* 7.9* 7.8* 8.0* 7.9*  MG 2.5*  --   --   --   --   PHOS 2.6  --   --   --   --    GFR: Estimated Creatinine Clearance: 51.9 mL/min (by C-G formula based on SCr of 0.89 mg/dL). Liver Function Tests: Recent Labs  Lab 03/24/23 0422  AST 28  ALT 16  ALKPHOS 86  BILITOT 1.1  PROT 4.6*  ALBUMIN 2.2*   Recent Results (from the past 240 hour(s))  SARS Coronavirus 2 by RT PCR (hospital order, performed in [facility] hospital lab) *cepheid single result test* Anterior Nasal Swab     Status: None   Collection Time: 03/20/23 11:31 PM   Specimen: Anterior Nasal Swab  Result Value Ref Range Status   SARS Coronavirus 2 by RT PCR NEGATIVE NEGATIVE Final    Comment: (NOTE) SARS-CoV-2 target nucleic acids are NOT  DETECTED.  The SARS-CoV-2 RNA is generally detectable in upper and lower respiratory specimens during the acute phase of infection. The lowest concentration of SARS-CoV-2 viral copies this assay can detect is 250 copies / mL. A negative result does not preclude SARS-CoV-2 infection and should not be used as the sole basis for treatment or other patient management decisions.  A negative result may occur with improper specimen collection / handling, submission of specimen other than nasopharyngeal swab, presence of viral mutation(s) within the areas targeted by this assay, and inadequate number of viral copies (<250 copies / mL). A negative result must be combined with clinical  observations, patient history, and epidemiological information.  Fact Sheet for Patients:   RoadLapTop.co.za  Fact Sheet for Healthcare Providers: http://kim-miller.com/  This test is not yet approved or  cleared by the [place] FDA and has been authorized for detection and/or diagnosis of SARS-CoV-2 by FDA under an Emergency Use Authorization (EUA).  This EUA will remain in effect (meaning this test can be used) for the duration of the COVID-19 declaration under Section 564(b)(1) of the Act, 21 U.S.C. section 360bbb-3(b)(1), unless the authorization is terminated or revoked sooner.  Performed at [facility], [address], [place], [place] [address]     Antimicrobials: Anti-infectives (From admission, onward)    Start     Dose/Rate Route Frequency Ordered Stop   03/26/23 2245  cefoTEtan (CEFOTAN) 2 g in sodium chloride 0.9 % 100 mL IVPB        2 g 200 mL/hr over 30 Minutes Intravenous Every 12 hours 03/26/23 2158 03/27/23 0056   03/26/23 0900  cefoTEtan (CEFOTAN) 2 g in sodium chloride 0.9 % 100 mL IVPB        2 g 200 mL/hr over 30 Minutes Intravenous  Once 03/25/23 1009 03/26/23 1038      Culture/Microbiology No results found  for: "SDES", "SPECREQUEST", "CULT", "REPTSTATUS"   Radiology Studies: No results found.   LOS: 7 days   Total time spent: 35 minutes  Dorcas Carrow, MD [facility]  03/29/2023, 11:58 AM

## 2023-03-29 NOTE — Consult Note (Signed)
   [facility] [THN] [facility] Inpatient Consult   03/29/2023  Merrell Luttrell 09/02/1940 [id number]  [facility] [THN]  Accountable Care Organization [ACO] Patient: [name] SNP Medicare insurance  [facility] Liaison remote coverage review for patient admitted to [facility]  Primary Care Provider:  Philip Aspen, Limmie Patricia, MD with [facility] [place] is listed to provide the transition of care follow up calls and appointments  Patient screened for 8 day length of stay and  to assess for potential [facility]  [THN] Care Management service needs for post hospital transition for care coordination.  Review of patient's electronic medical record reveals patient is active with an external care management team with [facility] SNP plan is noted in rosters.   Plan:  Patient is anticipate the community [facility] team for follow up with provider and Care Management with the [facility] SNP team  Of note, [facility] Care Management/Population Health does not replace or interfere with any arrangements made by the Inpatient Transition of Care team.  For questions contact:   Charlesetta Shanks, RN BSN CCM [facility]  [phone] business mobile phone Toll free office [phone]  Fax number: [phone] [email or web address]

## 2023-03-29 NOTE — TOC Progression Note (Signed)
Transition of Care [facility]) - Progression Note    Patient Details  Name: [name] MRN: [id number] Date of Birth: 17-Jul-1941  Transition of Care [facility]) CM/SW Contact  Larrie Kass, LCSW Phone Number: 03/29/2023, 10:57 AM  Clinical Narrative:    Met with pt to offer choice for [facility] recommendations, pt has agreed with no preference in [facility] agency. [facility] accepted pt for HHPT, OT, and aide. Discussed rec for rolling walker , pt has no preference in DME company, referral sent to [employer] for walker to be delivered pt's room prior to d/c. No further TOC needs , TOC sign off.   Expected Discharge Plan: Home w Home Health Services Barriers to Discharge: Continued Medical Work up  Expected Discharge Plan and Services In-house Referral: Clinical Social Work     Living arrangements for the past 2 months: Hotel/Motel                 DME Arranged: Dan Humphreys rolling DME Agency: [employer] Date DME Agency Contacted: 03/29/23 Time DME Agency Contacted: 1056 Representative spoke with at DME Agency: Zack HH Arranged: PT, OT, Nurse's Aide HH Agency: [facility] Date [facility] Agency Contacted: 03/29/23 Time HH Agency Contacted: 1056 Representative spoke with at [facility] Agency: kelly   Social Determinants of Health (SDOH) Interventions SDOH Screenings   Food Insecurity: No Food Insecurity (03/21/2023)  Housing: Low Risk  (03/21/2023)  Transportation Needs: No Transportation Needs (03/21/2023)  Utilities: Not At Risk (03/21/2023)  Depression (PHQ2-9): Low Risk  (05/25/2022)  Financial Resource Strain: Low Risk  (01/19/2022)  Stress: No Stress Concern Present (01/19/2022)  Tobacco Use: Medium Risk (03/26/2023)    Readmission Risk Interventions    03/23/2023    3:27 PM  Readmission Risk Prevention Plan  Transportation Screening Complete  PCP or Specialist Appt within 5-7 Days Complete  Home Care Screening Complete  Medication Review (RN CM) Complete

## 2023-03-30 DIAGNOSIS — D649 Anemia, unspecified: Secondary | ICD-10-CM | POA: Diagnosis not present

## 2023-03-30 LAB — CBC
HCT: 28 % — ABNORMAL LOW (ref 36.0–46.0)
HCT: 25.8 % — ABNORMAL LOW (ref 36.0–46.0)
Hemoglobin: 8.4 g/dL — ABNORMAL LOW (ref 12.0–15.0)
Hemoglobin: 7.9 g/dL — ABNORMAL LOW (ref 12.0–15.0)
MCH: 22.9 pg — ABNORMAL LOW (ref 26.0–34.0)
MCH: 22.9 pg — ABNORMAL LOW (ref 26.0–34.0)
MCHC: 30 g/dL (ref 30.0–36.0)
MCHC: 30.6 g/dL (ref 30.0–36.0)
MCV: 76.3 fL — ABNORMAL LOW (ref 80.0–100.0)
MCV: 74.8 fL — ABNORMAL LOW (ref 80.0–100.0)
Platelets: 160 10*3/uL (ref 150–400)
Platelets: 162 10*3/uL (ref 150–400)
RBC: 3.67 MIL/uL — ABNORMAL LOW (ref 3.87–5.11)
RBC: 3.45 MIL/uL — ABNORMAL LOW (ref 3.87–5.11)
RDW: 30.5 % — ABNORMAL HIGH (ref 11.5–15.5)
RDW: 30.7 % — ABNORMAL HIGH (ref 11.5–15.5)
WBC: 8.1 10*3/uL (ref 4.0–10.5)
WBC: 8.5 10*3/uL (ref 4.0–10.5)
nRBC: 0 % (ref 0.0–0.2)
nRBC: 0 % (ref 0.0–0.2)

## 2023-03-30 LAB — SURGICAL PATHOLOGY

## 2023-03-30 MED ORDER — HYDROCODONE-ACETAMINOPHEN 5-325 MG PO TABS: 1.0000 | ORAL_TABLET | ORAL | Status: DC | PRN

## 2023-03-30 MED ORDER — MORPHINE SULFATE (PF) 2 MG/ML IV SOLN
2.0000 mg | INTRAVENOUS | Status: DC | PRN
  Administered 2023-04-02: 2 mg via INTRAVENOUS
  Filled 2023-03-30: qty 1

## 2023-03-30 NOTE — Progress Notes (Signed)
Physical Therapy Treatment Patient Details Name: [name] MRN: [id number] DOB: June 19, 1941 Today's Date: 03/30/2023   History of Present Illness 82 yr old female admitted with shortness of breath and wheezing. She is s/p an exploratory laparotomy, and R hemicolectomy on 03/26/23, due to an umbilical hernia and colon mass. Hx of obesity, Afib, emphysema, CHF, pacemaker, colon mass, PE, anemia    PT Comments  Patient initially did not want to ambulate but consented. Upon sitting, patient requested to ambulate to BR.Patient had large amount bloody BM, RN notified. Patient much weaker  ambulating back to bed, appearing weak.  Noted HGB has dropped. Patient will benefit from continued inpatient follow up therapy, <3 hours/day     If plan is discharge home, recommend the following: A little help with walking and/or transfers;A little help with bathing/dressing/bathroom;Assistance with cooking/housework;Assist for transportation;Help with stairs or ramp for entrance   Can travel by private vehicle     No  Equipment Recommendations       Recommendations for Other Services       Precautions / Restrictions Precautions Precautions: Fall Precaution Comments: abd surgery Required Braces or Orthoses: Other Brace Other Brace: abd binder Restrictions Weight Bearing Restrictions: No     Mobility  Bed Mobility   Bed Mobility: Supine to Sit, Sit to Supine     Supine to sit: Min assist, Used rails, HOB elevated Sit to supine: Mod assist   General bed mobility comments: min assist with HOB raised. Mod assistance for legs onto bed.    Transfers Overall transfer level: Needs assistance Equipment used: Rolling walker (2 wheels) Transfers: Sit to/from Stand Sit to Stand: Mod assist           General transfer comment: Required 1 verbal cue to push up from bed with at least 1 upper extremity, mod assist to power up    Ambulation/Gait Ambulation/Gait assistance: Min assist Gait  Distance (Feet): 20 Feet (x 2) Assistive device: Rolling walker (2 wheels) Gait Pattern/deviations: Step-through pattern, Decreased stride length Gait velocity: decr     General Gait Details: Patient  ambulated  with more effort coming back from BR, had large bloody BM, patient appears weaker.   Stairs             Wheelchair Mobility     Tilt Bed    Modified Rankin (Stroke Patients Only)       Balance Overall balance assessment: Needs assistance     Sitting balance - Comments: static sitting-good. dynamic sitting-fair+   Standing balance support: During functional activity, Bilateral upper extremity supported, Reliant on assistive device for balance Standing balance-Leahy Scale: Fair                              Cognition Arousal: Alert   Overall Cognitive Status: Within Functional Limits for tasks assessed                                          Exercises      General Comments        Pertinent Vitals/Pain Pain Assessment Pain Assessment: No/denies pain    Home Living                          Prior Function  PT Goals (current goals can now be found in the care plan section) Progress towards PT goals: Not progressing toward goals - comment (weaker today)    Frequency           PT Plan      Co-evaluation              AM-PAC PT "6 Clicks" Mobility   Outcome Measure  Help needed turning from your back to your side while in a flat bed without using bedrails?: A Little Help needed moving from lying on your back to sitting on the side of a flat bed without using bedrails?: A Lot Help needed moving to and from a bed to a chair (including a wheelchair)?: A Lot Help needed standing up from a chair using your arms (e.g., wheelchair or bedside chair)?: A Lot Help needed to walk in hospital room?: A Lot Help needed climbing 3-5 steps with a railing? : A Lot 6 Click Score: 13    End of  Session Equipment Utilized During Treatment: Gait belt Activity Tolerance: Patient limited by fatigue Patient left: in bed;with call bell/phone within reach;with family/visitor present;with bed alarm set Nurse Communication: Mobility status PT Visit Diagnosis: Muscle weakness (generalized) (M62.81);Difficulty in walking, not elsewhere classified (R26.2)     Time: 0981-1914 PT Time Calculation (min) (ACUTE ONLY): 27 min  Charges:    $Gait Training: 8-22 mins $Self Care/Home Management: 8-22 PT General Charges $$ ACUTE PT VISIT: 1 Visit                     Blanchard Kelch PT Acute Rehabilitation Services Office [phone] Weekend [phone]    Rada Hay 03/30/2023, 4:35 PM

## 2023-03-30 NOTE — Plan of Care (Signed)
  Problem: Education: Goal: Knowledge of General Education information will improve Description: Including pain rating scale, medication(s)/side effects and non-pharmacologic comfort measures Outcome: Progressing   Problem: Clinical Measurements: Goal: Will remain free from infection Outcome: Progressing   Problem: Nutrition: Goal: Adequate nutrition will be maintained Outcome: Progressing   Problem: Coping: Goal: Level of anxiety will decrease Outcome: Progressing   Problem: Elimination: Goal: Will not experience complications related to bowel motility Outcome: Progressing Goal: Will not experience complications related to urinary retention Outcome: Progressing   Problem: Pain Managment: Goal: General experience of comfort will improve Outcome: Progressing   Problem: Safety: Goal: Ability to remain free from injury will improve Outcome: Progressing   Problem: Skin Integrity: Goal: Risk for impaired skin integrity will decrease Outcome: Progressing

## 2023-03-30 NOTE — TOC Progression Note (Signed)
Transition of Care [facility]) - Progression Note    Patient Details  Name: [name] MRN: [id number] Date of Birth: 1940-12-26  Transition of Care [facility]) CM/SW Contact  Larrie Kass, LCSW Phone Number: 03/30/2023, 11:09 AM  Clinical Narrative:    CSW received consult stating family would like to reconsider SNF placement. Per mobility specialist pt has walked 229ft, and does not qualify for rehab. CSW spoke with pt and family, they stated they would like pt to be placed for rehab since she is living in a hotel. CSW explained pt does not qualify for Short term rehab for physical therapy, and if they are needing LTC placement pt will have to have Medicaid or private pay for placement. Pt's son stated he did not want placement at this time. CSW has added SW to pt's [facility] services and received permission for a referral to be sent to [facility] to assist with placement in the community. Pt will need SW added to [facility] orders , MD made aware.   3:00pm CSW received message from RN, pt's son would like to discuss POA. CSW met with pt's son and explained the hospital can only arrange HCPOA through the chaplin. Pt's son stated he does not think this is needed as it is only him and his brother. No further TOC needs.      Expected Discharge Plan: Home w Home Health Services Barriers to Discharge: Continued Medical Work up  Expected Discharge Plan and Services In-house Referral: Clinical Social Work     Living arrangements for the past 2 months: Hotel/Motel                 DME Arranged: Dan Humphreys rolling DME Agency: [employer] Date DME Agency Contacted: 03/29/23 Time DME Agency Contacted: 1056 Representative spoke with at DME Agency: Zack HH Arranged: PT, OT, Nurse's Aide HH Agency: [facility] Date [facility] Agency Contacted: 03/29/23 Time HH Agency Contacted: 1056 Representative spoke with at [facility] Agency: kelly   Social Determinants of Health (SDOH) Interventions SDOH Screenings    Food Insecurity: No Food Insecurity (03/21/2023)  Housing: Low Risk  (03/21/2023)  Transportation Needs: No Transportation Needs (03/21/2023)  Utilities: Not At Risk (03/21/2023)  Depression (PHQ2-9): Low Risk  (05/25/2022)  Financial Resource Strain: Low Risk  (01/19/2022)  Stress: No Stress Concern Present (01/19/2022)  Tobacco Use: Medium Risk (03/26/2023)    Readmission Risk Interventions    03/23/2023    3:27 PM  Readmission Risk Prevention Plan  Transportation Screening Complete  PCP or Specialist Appt within 5-7 Days Complete  Home Care Screening Complete  Medication Review (RN CM) Complete

## 2023-03-30 NOTE — Plan of Care (Signed)
  Problem: Clinical Measurements: Goal: Diagnostic test results will improve Outcome: Progressing   Problem: Nutrition: Goal: Adequate nutrition will be maintained Outcome: Progressing   Problem: Skin Integrity: Goal: Risk for impaired skin integrity will decrease Outcome: Progressing   Problem: Education: Goal: Knowledge of General Education information will improve Description: Including pain rating scale, medication(s)/side effects and non-pharmacologic comfort measures Outcome: Adequate for Discharge   Problem: Health Behavior/Discharge Planning: Goal: Ability to manage health-related needs will improve Outcome: Adequate for Discharge   Problem: Clinical Measurements: Goal: Cardiovascular complication will be avoided Outcome: Adequate for Discharge   Problem: Activity: Goal: Risk for activity intolerance will decrease Outcome: Adequate for Discharge   Problem: Coping: Goal: Level of anxiety will decrease Outcome: Adequate for Discharge   Problem: Pain Managment: Goal: General experience of comfort will improve Outcome: Adequate for Discharge   Problem: Safety: Goal: Ability to remain free from injury will improve Outcome: Adequate for Discharge

## 2023-03-30 NOTE — Discharge Instructions (Signed)

## 2023-03-30 NOTE — Care Management Important Message (Signed)
Important Message  Patient Details IM Letter given. Name: Marlyce Molter MRN: [id number] Date of Birth: [birth year]   Medicare Important Message Given:  Yes     Caren Macadam 03/30/2023, 12:06 PM

## 2023-03-30 NOTE — Progress Notes (Signed)
PROGRESS NOTE [name]  UEA:[id number] DOB: [birth year] DOA: 03/20/2023 PCP: Philip Aspen, Limmie Patricia, MD  Brief Narrative/Hospital Course: 82 y.o. female with known history of COPD, GERD, hypertension, hyperlipidemia and recent history of pulmonary embolism on 12/2022, paroxysmal A-fib status post pacemaker presented to the ER with shortness of breath, dyspnea on exertion and wheezing along with reports of dark sticky stools.   In the ED: anemia w/ hemoglobin 7.1,admitted with acute GI bleeding, received 2 units of PRBC.  Patient was on Coumadin for paroxysmal A-fib with irregular dosing, she has been on Eliquis since the diagnosis. 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 Underwent EGD colonoscopy and found to have nearly obstructive colonic mass hemoglobin improving with 2 unit PRBC.  8/19, ex lap, extended right hemicolectomy and primary anastomosis.   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  Margins negative. 8/23, return of bowel function.  Eliquis was resumed.  She started having bloody bowel movement today.  Hemoglobin is 8.4.  Overall improving.  Subjective:  Patient seen in the morning rounds.  She had uneventful night.  Did not report blood in her stool overnight.  However I was notified that later in the morning, she had grossly bloody bowel movement.  Tolerating full liquid diet.  Pain is controlled.  Son at the bedside inquiring about going to skilled nursing facility for rehab. Eliquis held.  Hemoglobin 8.4.  Will recheck in the evening to ensure stabilization.  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  Hemoglobin initially stable.  With return of bowel function and on Eliquis she has bloody bowel movement today.   Hemoglobin 8.4.  Check every 12 hours and transfuse for hemoglobin less than 7.   Had unremarkable EGD.  Colonoscopy with colon mass and ultimately underwent extended colectomy and anastomosis. Soft diet today. Continue mobility. Found to have invasive adenocarcinoma, free margins, free lymph nodes.  Surgery following.  Hypokalemia and hypomagnesemia: Resolved.  Adequate.   Metabolic acidosis: Stable.  History of pulmonary embolism,recently diagnosed: Anticoagulation was on hold due to active bleeding before surgery. Day 3 on Eliquis after surgery, now with bloody bowel movement.  Eliquis on hold.  Tachybradycardia syndrome status post pacemaker Chronic diastolic heart failure Euvolemic.  BNP slightly elevated but no evidence of CHF currently.  Monitor volume status.Cardiology s/oNet IO Since Admission: 7,117.27 mL [03/30/23 1346]   GERD: on Protonix  Class I Obesity Body mass index is 30.51 kg/m. :    DVT prophylaxis: SCD's Start: 03/26/23 2159 SCDs Start: 03/21/23 0911 Code Status:   Code Status: Full Code Family Communication: Patient's son at the bedside. Patient status is: Postop recovery.  Bloody bowel movements.  Needs monitoring. Level of care: Med-Surg   Dispo: The patient is from: Home            Anticipated disposition: Home once tolerates oral intake and no more rectal bleeding.  She has to tolerate anticoagulation. Objective: Vitals last 24 hrs: Vitals:   03/30/23 0636 03/30/23 0853 03/30/23 0954 03/30/23 1241  BP: (!) 114/52  (!) 111/53 (!) 111/53  Pulse: 74  73 82  Resp: 18  20 16  Temp: 98 F (36.7 C)  (!) 97.5 F (36.4 C) 97.7 F (36.5 C)  TempSrc: Oral  Oral Oral  SpO2: 97% 97% 100% 100%  Weight:      Height:       Weight change:   Physical Examination: On morning exam. Looked comfortable.  Laying in bed.   On room air. Midline incision clean and dry.  Abdomen is soft. Staples intact.  She has very small amount of serosanguineous discharge from the lower part of the incision.  Bowel sound present. No edema.  No cyanosis. Chest exam: Bilateral clear.  Medications reviewed:  Scheduled Meds:  acetaminophen  1,000 mg Oral Q6H   atorvastatin  20 mg Oral Daily   docusate sodium  100 mg Oral BID   feeding supplement  1 Container Oral TID BM   mometasone-formoterol  2 puff Inhalation BID   montelukast  10 mg Oral QHS   pantoprazole (PROTONIX) IV  40 mg Intravenous Q12H   sodium chloride flush  3 mL Intravenous Q12H   Continuous Infusions:      Diet Order             DIET SOFT Fluid consistency: Thin  Diet effective now                   Intake/Output Summary (Last 24 hours) at 03/30/2023 1346 Last data filed at 03/29/2023 2226 Gross per 24 hour  Intake 3 ml  Output --  Net 3 ml   Net IO Since Admission: 7,117.27 mL [03/30/23 1346]  Wt Readings from Last 3 Encounters:  03/20/23 85.7 kg  02/07/23 94.8 kg  12/12/22 99.6 kg     Unresulted Labs (From admission, onward)     Start     Ordered   03/31/23 0500  CBC  Tomorrow morning,   R       Question:  Specimen collection method  Answer:  Lab=Lab collect   03/30/23 1100          Data Reviewed: I have personally reviewed following labs and imaging studies CBC: Recent Labs  Lab 03/24/23 0422 03/25/23 0417 03/27/23 0749 03/28/23 0639 03/29/23 1102 03/30/23 0950  WBC 5.3 6.9 15.6* 12.7* 9.3 8.1  NEUTROABS 3.3  --   --   --   --   --   HGB 8.8* 9.0* 10.4* 9.5* 9.0* 8.4*  HCT 29.2* 30.1* 35.4* 31.5* 29.5* 28.0*  MCV 72.8* 72.9* 74.8* 73.1* 73.4* 76.3*  PLT 191 185 173 172 167 160   Basic Metabolic Panel: Recent Labs  Lab 03/24/23 0422 03/25/23 0417 03/27/23 0427 03/28/23 0639 03/29/23 1102  NA 141 139 137 136 136  K 4.2 4.1 4.7 3.7 3.6  CL 119* 115* 113* 108 111  CO2 18* 19* 17* 20* 18*  GLUCOSE 76 99  177* 123* 125*  BUN <5* 5* 11 15 15   CREATININE 0.82 0.84 1.02* 0.80 0.89  CALCIUM 7.8* 7.9* 7.8* 8.0* 7.9*  MG 2.5*  --   --   --   --   PHOS 2.6  --   --   --   --    GFR: Estimated Creatinine Clearance: 51.9 mL/min (by C-G formula based on SCr of 0.89 mg/dL). Liver Function Tests: Recent Labs  Lab 03/24/23 0422  AST 28  ALT 16  ALKPHOS 86  BILITOT 1.1  PROT 4.6*  ALBUMIN 2.2*   Recent Results (from the past 240 hour(s))  SARS Coronavirus 2 by RT PCR (hospital order, performed  in [facility] hospital lab) *cepheid single result test* Anterior Nasal Swab     Status: None   Collection Time: 03/20/23 11:31 PM   Specimen: Anterior Nasal Swab  Result Value Ref Range Status   SARS Coronavirus 2 by RT PCR NEGATIVE NEGATIVE Final    Comment: (NOTE) SARS-CoV-2 target nucleic acids are NOT DETECTED.  The SARS-CoV-2 RNA is generally detectable in upper and lower respiratory specimens during the acute phase of infection. The lowest concentration of SARS-CoV-2 viral copies this assay can detect is 250 copies / mL. A negative result does not preclude SARS-CoV-2 infection and should not be used as the sole basis for treatment or other patient management decisions.  A negative result may occur with improper specimen collection / handling, submission of specimen other than nasopharyngeal swab, presence of viral mutation(s) within the areas targeted by this assay, and inadequate number of viral copies (<250 copies / mL). A negative result must be combined with clinical observations, patient history, and epidemiological information.  Fact Sheet for Patients:   RoadLapTop.co.za  Fact Sheet for Healthcare Providers: http://kim-miller.com/  This test is not yet approved or  cleared by the [place] FDA and has been authorized for detection and/or diagnosis of SARS-CoV-2 by FDA under an Emergency Use Authorization (EUA).  This EUA will  remain in effect (meaning this test can be used) for the duration of the COVID-19 declaration under Section 564(b)(1) of the Act, 21 U.S.C. section 360bbb-3(b)(1), unless the authorization is terminated or revoked sooner.  Performed at [facility], [address] [place], [place] [address]     Antimicrobials: Anti-infectives (From admission, onward)    Start     Dose/Rate Route Frequency Ordered Stop   03/26/23 2245  cefoTEtan (CEFOTAN) 2 g in sodium chloride 0.9 % 100 mL IVPB        2 g 200 mL/hr over 30 Minutes Intravenous Every 12 hours 03/26/23 2158 03/27/23 0056   03/26/23 0900  cefoTEtan (CEFOTAN) 2 g in sodium chloride 0.9 % 100 mL IVPB        2 g 200 mL/hr over 30 Minutes Intravenous  Once 03/25/23 1009 03/26/23 1038      Culture/Microbiology No results found for: "SDES", "SPECREQUEST", "CULT", "REPTSTATUS"   Radiology Studies: No results found.   LOS: 8 days   Total time spent: 35 minutes  Dorcas Carrow, MD [facility]  03/30/2023, 1:46 PM

## 2023-03-30 NOTE — Progress Notes (Signed)
Occupational Therapy Treatment Patient Details Name: [name] MRN: [id number] DOB: 11-24-1940 Today's Date: 03/30/2023   History of present illness 82 yr old female admitted with shortness of breath and wheezing. She is s/p an exploratory laparotomy, and R hemicolectomy on 03/26/23, due to an umbilical hernia and colon mass. Hx of obesity, Afib, emphysema, CHF, pacemaker, colon mass, PE, anemia   OT comments  The pt continues to be motivated to participate in therapy. She required moderately increased time and effort with min assist, in order to transfer from supine to sitting EOB. She further required maximal assist for lower body dressing, as she was limited by B LE edema, decreased bilateral hip flexibility and her abdominal incision. She further needed maximal assist for correctly donning and adjusting her abdominal binder. Min assist was needed for sit to stand using a RW. She performed short distance ambulation in her room with a RW requiring CGA. She was assisted into sitting at chair level at the end of the session. The pt and her son continue to express a desire in trying for short-term SNF rehab upon discharge; OT anticipates the pt would benefit from such services, as the pt currently requires at least moderate assist for performing some self-care tasks, such as lower body dressing and toileting. Continue OT plan of care. Patient will benefit from continued inpatient follow up therapy, <3 hours/day.       If plan is discharge home, recommend the following:  Assist for transportation;Assistance with cooking/housework;A lot of help with bathing/dressing/bathroom;A little help with walking and/or transfers   Equipment Recommendations  Other (comment) (defer to next level of care)    Recommendations for Other Services      Precautions / Restrictions Precautions Precautions: Fall Restrictions Weight Bearing Restrictions: No Other Position/Activity Restrictions: abdominal binder        Mobility Bed Mobility Overal bed mobility: Needs Assistance Bed Mobility: Supine to Sit     Supine to sit: Min assist, Used rails, HOB elevated     General bed mobility comments: She required moderately increased time and effort to perform supine to sit, with instruction required for best transfer technique    Transfers Overall transfer level: Needs assistance Equipment used: Rolling walker (2 wheels) Transfers: Sit to/from Stand Sit to Stand: Min assist           General transfer comment: Required 1 verbal cue to push up from bed with at least 1 upper extremity         ADL either performed or assessed with clinical judgement   ADL Overall ADL's : Needs assistance/impaired Eating/Feeding: Independent;Sitting Eating/Feeding Details (indicate cue type and reason): at chair level Grooming: Set up;Sitting Grooming Details (indicate cue type and reason): at chair level Upper Body Bathing: Set up;Sitting Upper Body Bathing Details (indicate cue type and reason): based on clinical judgement Lower Body Bathing: Moderate assistance Lower Body Bathing Details (indicate cue type and reason): based on clincial judgement Upper Body Dressing : Minimal assistance;Sitting   Lower Body Dressing: Maximal assistance Lower Body Dressing Details (indicate cue type and reason): She was unable to doff or donn her socks seated EOB. She was limited by BLE edema, decreased bilateral hip flexibility, and abdominal incision.                      Cognition Arousal: Alert Behavior During Therapy: WFL for tasks assessed/performed Overall Cognitive Status: Within Functional Limits for tasks assessed  Pertinent Vitals/ Pain       Pain Assessment Pain Assessment: No/denies pain         Frequency  Min 1X/week        Progress Toward Goals  OT Goals(current goals can now be found in the care plan section)  Progress towards OT goals:  Progressing toward goals  Acute Rehab OT Goals Patient Stated Goal: to get better and return to normal activities OT Goal Formulation: With patient Time For Goal Achievement: 04/11/23 Potential to Achieve Goals: Good  Plan         AM-PAC OT "6 Clicks" Daily Activity     Outcome Measure   Help from another person eating meals?: None Help from another person taking care of personal grooming?: A Little Help from another person toileting, which includes using toliet, bedpan, or urinal?: A Lot Help from another person bathing (including washing, rinsing, drying)?: A Lot Help from another person to put on and taking off regular upper body clothing?: A Little Help from another person to put on and taking off regular lower body clothing?: A Lot 6 Click Score: 16    End of Session Equipment Utilized During Treatment: Rolling walker (2 wheels)  OT Visit Diagnosis: Unsteadiness on feet (R26.81);Muscle weakness (generalized) (M62.81)   Activity Tolerance Patient tolerated treatment well   Patient Left in chair;with call bell/phone within reach;with family/visitor present   Nurse Communication Mobility status        Time: 7829-5621 OT Time Calculation (min): 21 min  Charges: OT General Charges $OT Visit: 1 Visit OT Treatments $Therapeutic Activity: 8-22 mins     Reuben Likes, OTR/L 03/30/2023, 11:15 AM

## 2023-03-30 NOTE — Progress Notes (Signed)
Patient ID: [name], female   DOB: 03/26/1941, 82 y.o.   MRN: [id number] [facility] Progress Note  4 Days Post-Op  Subjective: CC-  Abdomen sore but no significant pain. Tolerating full liquids. Denies n/v. Passing flatus and has had a BM last night and this morning. BM very bloody this morning per nurse. CBC pending. No tachycardia or hypotension.  Objective: Vital signs in last 24 hours: Temp:  [97.3 F (36.3 C)-98 F (36.7 C)] 98 F (36.7 C) (08/23 0636) Pulse Rate:  [71-90] 74 (08/23 0636) Resp:  [17-19] 18 (08/23 0636) BP: (100-114)/(49-55) 114/52 (08/23 0636) SpO2:  [97 %-100 %] 97 % (08/23 0853) Last BM Date : 03/30/23  Intake/Output from previous day: 08/22 0701 - 08/23 0700 In: 483 [P.O.:480; I.V.:3] Out: -  Intake/Output this shift: No intake/output data recorded.  PE: Gen:  Alert, NAD, pleasant Abd: soft, nondistended, mild right sided tenderness, incision cdi with staples present and no erythema or drainage  Lab Results:  Recent Labs    03/28/23 0639 03/29/23 1102  WBC 12.7* 9.3  HGB 9.5* 9.0*  HCT 31.5* 29.5*  PLT 172 167   BMET Recent Labs    03/28/23 0639 03/29/23 1102  NA 136 136  K 3.7 3.6  CL 108 111  CO2 20* 18*  GLUCOSE 123* 125*  BUN 15 15  CREATININE 0.80 0.89  CALCIUM 8.0* 7.9*   PT/INR No results for input(s): "LABPROT", "INR" in the last 72 hours. CMP     Component Value Date/Time   NA 136 03/29/2023 1102   NA 143 04/06/2020 1327   K 3.6 03/29/2023 1102   CL 111 03/29/2023 1102   CO2 18 (L) 03/29/2023 1102   GLUCOSE 125 (H) 03/29/2023 1102   BUN 15 03/29/2023 1102   BUN 15 04/06/2020 1327   CREATININE 0.89 03/29/2023 1102   CREATININE 1.08 05/05/2011 1536   CALCIUM 7.9 (L) 03/29/2023 1102   PROT 4.6 (L) 03/24/2023 0422   ALBUMIN 2.2 (L) 03/24/2023 0422   AST 28 03/24/2023 0422   ALT 16 03/24/2023 0422   ALKPHOS 86 03/24/2023 0422   BILITOT 1.1 03/24/2023 0422   GFRNONAA >60 03/29/2023 1102    GFRNONAA 50 (L) 05/05/2011 1536   GFRAA 60 04/06/2020 1327   GFRAA 60 (L) 05/05/2011 1536   Lipase     Component Value Date/Time   LIPASE 29 11/22/2020 2212       Studies/Results: No results found.  Anti-infectives: Anti-infectives (From admission, onward)    Start     Dose/Rate Route Frequency Ordered Stop   03/26/23 2245  cefoTEtan (CEFOTAN) 2 g in sodium chloride 0.9 % 100 mL IVPB        2 g 200 mL/hr over 30 Minutes Intravenous Every 12 hours 03/26/23 2158 03/27/23 0056   03/26/23 0900  cefoTEtan (CEFOTAN) 2 g in sodium chloride 0.9 % 100 mL IVPB        2 g 200 mL/hr over 30 Minutes Intravenous  Once 03/25/23 1009 03/26/23 1038        Assessment/Plan POD#4 s/p Exploratory laparotomy, lysis of adhesions, right hemicolectomy with primary anastomosis by Dr. Corliss Skains for Ascending colon cancer by Dr. Corliss Skains 03/26/23 - surgical path: Invasive colonic adenocarcinoma associated with a tubular adenoma with high-grade dysplasia, margins negative, 24 negative lymph nodes >> this has been discussed with the patient and her family - Tolerating full liquids and having bowel function. Advance to soft diet - Bowel movement was bloody this  morning. VSS. Check stat CBC. If h/h down will need to hold eliquis, likely has some bleeding from staple line - Mobilize, PT - recommend SNF initially and then [facility] [place] [facility] PT. Patient's family concerned about going home and would like to consider SNF if possible. PT to see again today. I placed TOC consult for assistance pursuing SNF options   FEN - soft diet. Boost. IVF per primary  VTE - SCDs, eliquis ID - Cefotetan peri-op, none currently.  Foley - Out POD 1, voiding   A. fib Arthritis Asthma COPD HTN HLD pacemaker secondary to tachy-brady syndrome Chronic diastolic HF with LF of 55-60% PE in 2024 on Eliquis Anemia Obesity GERD    LOS: 8 days    Franne Forts, [facility] 03/30/2023, 9:07 AM Please see Amion for pager  number during day hours 7:00am-4:30pm

## 2023-03-31 DIAGNOSIS — D649 Anemia, unspecified: Secondary | ICD-10-CM | POA: Diagnosis not present

## 2023-03-31 LAB — CBC
HCT: 22.7 % — ABNORMAL LOW (ref 36.0–46.0)
HCT: 25 % — ABNORMAL LOW (ref 36.0–46.0)
HCT: 21.9 % — ABNORMAL LOW (ref 36.0–46.0)
Hemoglobin: 7 g/dL — ABNORMAL LOW (ref 12.0–15.0)
Hemoglobin: 7.3 g/dL — ABNORMAL LOW (ref 12.0–15.0)
Hemoglobin: 6.6 g/dL — CL (ref 12.0–15.0)
MCH: 22.9 pg — ABNORMAL LOW (ref 26.0–34.0)
MCH: 22.5 pg — ABNORMAL LOW (ref 26.0–34.0)
MCH: 22.4 pg — ABNORMAL LOW (ref 26.0–34.0)
MCHC: 30.8 g/dL (ref 30.0–36.0)
MCHC: 29.2 g/dL — ABNORMAL LOW (ref 30.0–36.0)
MCHC: 30.1 g/dL (ref 30.0–36.0)
MCV: 74.2 fL — ABNORMAL LOW (ref 80.0–100.0)
MCV: 77.2 fL — ABNORMAL LOW (ref 80.0–100.0)
MCV: 74.5 fL — ABNORMAL LOW (ref 80.0–100.0)
Platelets: 158 10*3/uL (ref 150–400)
Platelets: 177 10*3/uL (ref 150–400)
Platelets: 175 10*3/uL (ref 150–400)
RBC: 3.06 MIL/uL — ABNORMAL LOW (ref 3.87–5.11)
RBC: 3.24 MIL/uL — ABNORMAL LOW (ref 3.87–5.11)
RBC: 2.94 MIL/uL — ABNORMAL LOW (ref 3.87–5.11)
RDW: 30.9 % — ABNORMAL HIGH (ref 11.5–15.5)
RDW: 31.3 % — ABNORMAL HIGH (ref 11.5–15.5)
RDW: 31.7 % — ABNORMAL HIGH (ref 11.5–15.5)
WBC: 7.6 10*3/uL (ref 4.0–10.5)
WBC: 9.5 10*3/uL (ref 4.0–10.5)
WBC: 9.6 10*3/uL (ref 4.0–10.5)
nRBC: 0 % (ref 0.0–0.2)
nRBC: 0.3 % — ABNORMAL HIGH (ref 0.0–0.2)
nRBC: 0.4 % — ABNORMAL HIGH (ref 0.0–0.2)

## 2023-03-31 LAB — PREPARE RBC (CROSSMATCH)

## 2023-03-31 MED ORDER — SODIUM CHLORIDE 0.9% IV SOLUTION: Freq: Once | INTRAVENOUS | Status: AC

## 2023-03-31 MED ORDER — SODIUM CHLORIDE 0.9 % IV SOLN: INTRAVENOUS | Status: DC

## 2023-03-31 MED ORDER — ENSURE SURGERY PO LIQD
237.0000 mL | Freq: Two times a day (BID) | ORAL | Status: DC
  Administered 2023-03-31 – 2023-04-04 (×8): 237 mL via ORAL
  Filled 2023-03-31 (×10): qty 237

## 2023-03-31 NOTE — Progress Notes (Signed)
    Patient Name: [name]           DOB: November 26, 1940  MRN: [id number]      Admission Date: 03/20/2023  Attending Provider: Lorin Glass, MD  Primary Diagnosis: Symptomatic anemia   Level of care: Med-Surg    CROSS COVER NOTE   Date of Service   03/31/2023   [name], 82 y.o. female, was admitted on 03/20/2023 for Symptomatic anemia.    HPI/Events of Note   Acute symptomatic anemia Secondary to blood loss from colon cancer.  Hemoglobin 7.3 -->  6.6.  No acute changes reported.  Hemodynamically stable. RN reports pt had a "dark and red BM" at 2000    Interventions/ Plan   Blood transfusion, 1 unit PRBC Recheck H&H after transfusion is complete.         Anthoney Harada, DNP, ACNPC- AG [facility]

## 2023-03-31 NOTE — TOC Progression Note (Addendum)
Transition of Care [facility]) - Progression Note    Patient Details  Name: [name] MRN: [id number] Date of Birth: 07/07/1941  [facility]) CM/SW Contact  Larrie Kass, LCSW Phone Number: 03/31/2023, 11:04 AM  Clinical Narrative:    Per chart review, PT has recommended SNF placement with pt walking 40 feet and appears to be weaker. CSW spoke with pt's son reminded him about the placement process and to present bed offers. Pt's son requested time to review beds. TOC to follow.     Barriers to Discharge: Continued Medical Work up  Expected Discharge Plan and Services In-house Referral: Clinical Social Work     Living arrangements for the past 2 months: Hotel/Motel                 DME Arranged: Walker rolling DME Agency: [employer] Date DME Agency Contacted: 03/29/23 Time DME Agency Contacted: 1056 Representative spoke with at DME Agency: Zack HH Arranged: PT, OT, Nurse's Aide HH Agency: [facility] Date [facility] Agency Contacted: 03/29/23 Time HH Agency Contacted: 1056 Representative spoke with at [facility] Agency: kelly   Social Determinants of Health (SDOH) Interventions SDOH Screenings   Food Insecurity: No Food Insecurity (03/21/2023)  Housing: Low Risk  (03/21/2023)  Transportation Needs: No Transportation Needs (03/21/2023)  Utilities: Not At Risk (03/21/2023)  Depression (PHQ2-9): Low Risk  (05/25/2022)  Financial Resource Strain: Low Risk  (01/19/2022)  Stress: No Stress Concern Present (01/19/2022)  Tobacco Use: Medium Risk (03/26/2023)    Readmission Risk Interventions    03/23/2023    3:27 PM  Readmission Risk Prevention Plan  Transportation Screening Complete  PCP or Specialist Appt within 5-7 Days Complete  Home Care Screening Complete  Medication Review (RN CM) Complete

## 2023-03-31 NOTE — Progress Notes (Signed)
Patient has has multiple loose, liquidly, bloody stools throughout the night.

## 2023-03-31 NOTE — Progress Notes (Signed)
PROGRESS NOTE  [name]  DOB: Oct 16, 1940  PCP: Henderson Cloud, MD WUX:[id number]  DOA: 03/20/2023  LOS: 9 days  Hospital Day: 12  Brief narrative: [name] is a 82 y.o. female with PMH significant for HTN, HLD, CHF, paroxysmal A-fib s/p pacemaker, PE 12/2022 on Eliquis anemia, COPD, GERD, arthritis. 8/13, patient presented to the ED with complaint of shortness of breath, wheezing along with dark sticky stools.  In the ED, patient had a hemoglobin of 7.1. CT abdomen showed proximal ascending colon mass, likely malignant. Admitted to [facility] GI and general surgery were consulted 8/16, colonoscopy showed nearly obstructing colon mass 8/19, underwent exploratory ectomy with extended right hemicolectomy and primary anastomosis. 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  Subjective: Patient was seen and examined this morning.  Pleasant elderly Caucasian female.  Lying in bed.  Not in distress.  Son at bedside. Chart reviewed. In the last 24 hours, afebrile, hemodynamically stable Last labs from this morning with hemoglobin low at 7  Assessment and plan: Newly diagnosed colon cancer S/p extended right hemicolectomy -8/19 Dr. Corliss Skains Postop ileus resolved.  Currently on soft diet. Pathology showed high-grade invasive adenocarcinoma the colon. CT chest without metastasis Oncology evaluation as an outpatient may be needed.   Acute symptomatic anemia Secondary to blood loss from colon cancer.  Was also on anticoagulation for history of A-fib and recent PE. Hemoglobin was low at 7.1 on admission.  Received 2 units of PRBC transfusion.  EGD unremarkable. 8/23, patient started having bloody bowel movement.  Hemoglobin trending down.  Low at 7 this morning.  Repeat hemoglobin was at 7.3 Continue to monitor CBC.  Obtain type and screen. Eliquis on hold Recent Labs    12/08/22 0430 12/09/22 0351 03/21/23 1212 03/22/23 0414  03/29/23 1102 03/30/23 0950 03/30/23 1620 03/31/23 0440 03/31/23 1041  HGB 9.1*   < > 10.8*   < > 9.0* 8.4* 7.9* 7.0* 7.3*  MCV 78.2*   < > 73.3*   < > 73.4* 76.3* 74.8* 74.2* 77.2*  VITAMINB12  --   --  906  --   --   --   --   --   --   FOLATE  --   --  10.2  --   --   --   --   --   --   FERRITIN 8*  --  7*  --   --   --   --   --   --   TIBC 363  --  314  --   --   --   --   --   --   IRON 20*  --  19*  --   --   --   --   --   --    < > = values in this interval not displayed.   Recent PE May 2024, CT angio chest showed few small peripheral segmental right-sided pulm emboli.  Ultrasound duplex lower extremities were negative for DVT.  Prior to that, patient was already on Coumadin but was not consistent compliant.  Following PE, patient was switched from Coumadin to Eliquis.  Eliquis which was held this admission but was resumed again after surgery.  Since patient started having bloody bowel movement, it is currently on hold again.  Paroxysmal A-fib Tachybradycardia syndrome s/p PPM Anticoagulation plan as above   Chronic diastolic heart failure Euvolemic.   Most recent echo from May 2024 with  EF 55 to 60%, grade 1 diastolic dysfunction Blood pressure running low normal.  Start on gentle IV hydration.   GERD on Protonix   Obesity  Body mass index is 30.51 kg/m. Patient has been advised to make an attempt to improve diet and exercise patterns to aid in weight loss.    Mobility: Encourage ambulation.  Goals of care   Code Status: Full Code     DVT prophylaxis:  SCD's Start: 03/26/23 2159 SCDs Start: 03/21/23 0911   Antimicrobials: None currently Fluid: None Consultants: General surgery Family Communication: Son at bedside  Status: Inpatient  Level of care:  Med-Surg   Patient is from: Home Needs to continue in-hospital care: Continues to have BRBPR Anticipated d/c to: Pending clinical course, pending surgical clearance for discharge   Diet:  Diet Order              DIET SOFT Fluid consistency: Thin  Diet effective now                   Scheduled Meds:  acetaminophen  1,000 mg Oral Q6H   atorvastatin  20 mg Oral Daily   docusate sodium  100 mg Oral BID   feeding supplement  237 mL Oral BID BM   mometasone-formoterol  2 puff Inhalation BID   montelukast  10 mg Oral QHS   pantoprazole (PROTONIX) IV  40 mg Intravenous Q12H   sodium chloride flush  3 mL Intravenous Q12H    PRN meds: acetaminophen **OR** acetaminophen, bisacodyl, diphenhydrAMINE **OR** diphenhydrAMINE, hydrALAZINE, HYDROcodone-acetaminophen, ipratropium, levalbuterol, morphine injection, ondansetron **OR** ondansetron (ZOFRAN) IV, senna-docusate, simethicone, traZODone   Infusions:   sodium chloride 75 mL/hr at 03/31/23 1400    Antimicrobials: Anti-infectives (From admission, onward)    Start     Dose/Rate Route Frequency Ordered Stop   03/26/23 2245  cefoTEtan (CEFOTAN) 2 g in sodium chloride 0.9 % 100 mL IVPB        2 g 200 mL/hr over 30 Minutes Intravenous Every 12 hours 03/26/23 2158 03/27/23 0056   03/26/23 0900  cefoTEtan (CEFOTAN) 2 g in sodium chloride 0.9 % 100 mL IVPB        2 g 200 mL/hr over 30 Minutes Intravenous  Once 03/25/23 1009 03/26/23 1038       Nutritional status:  Body mass index is 30.51 kg/m.          Objective: Vitals:   03/31/23 1304 03/31/23 1316  BP: (!) 101/42 106/61  Pulse: 69 87  Resp:  17  Temp:  (!) 97.3 F (36.3 C)  SpO2:  100%    Intake/Output Summary (Last 24 hours) at 03/31/2023 1642 Last data filed at 03/31/2023 1400 Gross per 24 hour  Intake 501.94 ml  Output --  Net 501.94 ml   Filed Weights   03/20/23 2142  Weight: 85.7 kg   Weight change:  Body mass index is 30.51 kg/m.   Physical Exam: General exam: Pleasant elderly African-American female.  Not in distress Skin: No rashes, lesions or ulcers. HEENT: Atraumatic, normocephalic, no obvious bleeding Lungs: Clear to auscultation  bilaterally CVS: Regular rate and rhythm, no murmur GI/Abd soft, mild appropriate postsurgical tenderness, nondistended, bowel sound present CNS: Alert, awake, oriented x 3 Psychiatry: Mood appropriate Extremities: No pedal edema, no calf tenderness.  Data Review: I have personally reviewed the laboratory data and studies available.  F/u labs ordered Unresulted Labs (From admission, onward)     Start     Ordered   04/01/23  0500  CBC  Tomorrow morning,   R       Question:  Specimen collection method  Answer:  Lab=Lab collect   03/31/23 0804   03/30/23 1700  CBC  5A & 5P,   R     Question:  Specimen collection method  Answer:  Lab=Lab collect   03/30/23 1349            Total time spent in review of labs and imaging, patient evaluation, formulation of plan, documentation and communication with family: 45 minutes  Signed, Lorin Glass, MD [facility] 03/31/2023

## 2023-03-31 NOTE — Progress Notes (Signed)
Patient ID: [name], female   DOB: 03/03/1941, 82 y.o.   MRN: [id number] [facility] Surgery Progress Note  5 Days Post-Op  Subjective: CC-  Abdomen sore but no significant pain. Feels very tired.  Tolerating soft diet. Denies n/v. Passing flatus and has had several bloody BM's.  No tachycardia or hypotension.  Objective: Vital signs in last 24 hours: Temp:  [97.5 F (36.4 C)-98.2 F (36.8 C)] 98.2 F (36.8 C) (08/24 0529) Pulse Rate:  [73-82] 80 (08/24 0529) Resp:  [16-20] 19 (08/24 0529) BP: (107-118)/(53-54) 107/54 (08/24 0529) SpO2:  [96 %-100 %] 98 % (08/24 0529) Last BM Date : 03/30/23  Intake/Output from previous day: 08/23 0701 - 08/24 0700 In: 240 [P.O.:240] Out: -  Intake/Output this shift: No intake/output data recorded.  PE: Gen:  Alert, NAD, pleasant Abd: soft, nondistended, mild right sided tenderness, incision cdi with staples present and no erythema or drainage  Lab Results:  Recent Labs    03/30/23 1620 03/31/23 0440  WBC 8.5 7.6  HGB 7.9* 7.0*  HCT 25.8* 22.7*  PLT 162 158   BMET Recent Labs    03/29/23 1102  NA 136  K 3.6  CL 111  CO2 18*  GLUCOSE 125*  BUN 15  CREATININE 0.89  CALCIUM 7.9*   PT/INR No results for input(s): "LABPROT", "INR" in the last 72 hours. CMP     Component Value Date/Time   NA 136 03/29/2023 1102   NA 143 04/06/2020 1327   K 3.6 03/29/2023 1102   CL 111 03/29/2023 1102   CO2 18 (L) 03/29/2023 1102   GLUCOSE 125 (H) 03/29/2023 1102   BUN 15 03/29/2023 1102   BUN 15 04/06/2020 1327   CREATININE 0.89 03/29/2023 1102   CREATININE 1.08 05/05/2011 1536   CALCIUM 7.9 (L) 03/29/2023 1102   PROT 4.6 (L) 03/24/2023 0422   ALBUMIN 2.2 (L) 03/24/2023 0422   AST 28 03/24/2023 0422   ALT 16 03/24/2023 0422   ALKPHOS 86 03/24/2023 0422   BILITOT 1.1 03/24/2023 0422   GFRNONAA >60 03/29/2023 1102   GFRNONAA 50 (L) 05/05/2011 1536   GFRAA 60 04/06/2020 1327   GFRAA 60 (L) 05/05/2011 1536   Lipase      Component Value Date/Time   LIPASE 29 11/22/2020 2212       Studies/Results: No results found.  Anti-infectives: Anti-infectives (From admission, onward)    Start     Dose/Rate Route Frequency Ordered Stop   03/26/23 2245  cefoTEtan (CEFOTAN) 2 g in sodium chloride 0.9 % 100 mL IVPB        2 g 200 mL/hr over 30 Minutes Intravenous Every 12 hours 03/26/23 2158 03/27/23 0056   03/26/23 0900  cefoTEtan (CEFOTAN) 2 g in sodium chloride 0.9 % 100 mL IVPB        2 g 200 mL/hr over 30 Minutes Intravenous  Once 03/25/23 1009 03/26/23 1038        Assessment/Plan POD#5 s/p Exploratory laparotomy, lysis of adhesions, right hemicolectomy with primary anastomosis by Dr. Corliss Skains for Ascending colon cancer by Dr. 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  - Bowel movement still bloody this morning. VSS. Hgb 7, Eliquis and Lovenox held.  Will cont to monitor - Mobilize, PT recommend SNF initially and then [facility]  PT. Patient's family concerned about going home and would like to consider SNF if possible. PT feels she is appropriate for SNF. TOC consult placed for assistance pursuing SNF options   FEN - soft diet. Boost. IVF per primary  VTE - SCDs, eliquis ID - Cefotetan peri-op, none currently.  Foley - Out POD 1, voiding   A. fib Arthritis Asthma COPD HTN HLD pacemaker secondary to tachy-brady syndrome Chronic diastolic HF with LF of 55-60% PE in 2024 on Eliquis Anemia Obesity GERD    LOS: 9 days    Vanita Panda, MD  Colorectal and General Surgery [facility] Surgery

## 2023-03-31 NOTE — Plan of Care (Signed)
  Problem: Nutrition: Goal: Adequate nutrition will be maintained Outcome: Progressing   Problem: Pain Managment: Goal: General experience of comfort will improve Outcome: Progressing   Problem: Safety: Goal: Ability to remain free from injury will improve Outcome: Progressing   

## 2023-04-01 DIAGNOSIS — D649 Anemia, unspecified: Secondary | ICD-10-CM | POA: Diagnosis not present

## 2023-04-01 LAB — CBC
HCT: 23.3 % — ABNORMAL LOW (ref 36.0–46.0)
Hemoglobin: 7.3 g/dL — ABNORMAL LOW (ref 12.0–15.0)
MCH: 24.2 pg — ABNORMAL LOW (ref 26.0–34.0)
MCHC: 31.3 g/dL (ref 30.0–36.0)
MCV: 77.2 fL — ABNORMAL LOW (ref 80.0–100.0)
Platelets: 158 10*3/uL (ref 150–400)
RBC: 3.02 MIL/uL — ABNORMAL LOW (ref 3.87–5.11)
RDW: 31.4 % — ABNORMAL HIGH (ref 11.5–15.5)
WBC: 8.5 10*3/uL (ref 4.0–10.5)
nRBC: 0.8 % — ABNORMAL HIGH (ref 0.0–0.2)

## 2023-04-01 NOTE — Progress Notes (Signed)
PROGRESS NOTE  [name]  DOB: 02/19/1941  PCP: Henderson Cloud, MD NFA:[id number]  DOA: 03/20/2023  LOS: 10 days  Hospital Day: 13  Brief narrative: [name] is a 82 y.o. female with PMH significant for HTN, HLD, CHF, paroxysmal A-fib s/p pacemaker, PE 12/2022 on Eliquis anemia, COPD, GERD, arthritis. 8/13, patient presented to the ED with complaint of shortness of breath, wheezing along with dark sticky stools.  In the ED, patient had a hemoglobin of 7.1. CT abdomen showed proximal ascending colon mass, likely malignant. Admitted to [facility] GI and general surgery were consulted 8/16, colonoscopy showed nearly obstructing colon mass 8/19, underwent exploratory ectomy with extended right hemicolectomy and primary anastomosis. 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  Subjective: Patient was seen and examined this morning.   Lying down in bed.  Not in distress son at bedside.  Continues to have blood with bowel movements. General surgery following  Assessment and plan: Newly diagnosed colon cancer S/p extended right hemicolectomy -8/19 Dr. Corliss Skains Postop ileus resolved.  Currently on soft diet. Pathology showed high-grade invasive adenocarcinoma the colon. CT chest without metastasis Oncology evaluation as an outpatient may be needed.   Acute symptomatic anemia Secondary to blood loss from colon cancer.  Was also on anticoagulation for history of A-fib and recent PE. Hemoglobin was low at 7.1 on admission.  Received 2 units of PRBC transfusion.  EGD unremarkable. 8/23, patient started having bloody bowel movement.  Hemoglobin down trended to the lowest of 6.6 yesterday.  1 unit of PRBC transfusion was given.  Hemoglobin improved to 7.3 today.  Continues to have several bloody bowel movements. Eliquis remain on hold Recent Labs    12/08/22 0430 12/09/22 0351 03/21/23 1212 03/22/23 0414 03/30/23 1620 03/31/23 0440  03/31/23 1041 03/31/23 1842 04/01/23 0444  HGB 9.1*   < > 10.8*   < > 7.9* 7.0* 7.3* 6.6* 7.3*  MCV 78.2*   < > 73.3*   < > 74.8* 74.2* 77.2* 74.5* 77.2*  VITAMINB12  --   --  906  --   --   --   --   --   --   FOLATE  --   --  10.2  --   --   --   --   --   --   FERRITIN 8*  --  7*  --   --   --   --   --   --   TIBC 363  --  314  --   --   --   --   --   --   IRON 20*  --  19*  --   --   --   --   --   --    < > = values in this interval not displayed.   Recent PE May 2024, CT angio chest showed few small peripheral segmental right-sided pulm emboli.  Ultrasound duplex lower extremities were negative for DVT.  Prior to that, patient was already on Coumadin but was not consistent compliant.  Following PE, patient was switched from Coumadin to Eliquis.  Eliquis which was held this admission but was resumed again after surgery.  Since patient started having bloody bowel movement, it is currently on hold again.  Paroxysmal A-fib Tachybradycardia syndrome s/p PPM Anticoagulation plan as above   Chronic diastolic heart failure Euvolemic.   Most recent echo from May 2024 with EF 55 to 60%, grade  1 diastolic dysfunction Blood pressure running low normal.  Continue gentle hydration   GERD on Protonix   Obesity  Body mass index is 30.51 kg/m. Patient has been advised to make an attempt to improve diet and exercise patterns to aid in weight loss.    Mobility: Encourage ambulation.  Goals of care   Code Status: Full Code     DVT prophylaxis:  SCD's Start: 03/26/23 2159 SCDs Start: 03/21/23 0911   Antimicrobials: None currently Fluid: Reduce NS rate to 50 mL/h Consultants: General surgery Family Communication: Son at bedside  Status: Inpatient  Level of care:  Med-Surg   Patient is from: Home Needs to continue in-hospital care: Continues to have BRBPR Anticipated d/c to: Pending clinical course, pending surgical clearance for discharge   Diet:  Diet Order              DIET SOFT Fluid consistency: Thin  Diet effective now                   Scheduled Meds:  acetaminophen  1,000 mg Oral Q6H   atorvastatin  20 mg Oral Daily   docusate sodium  100 mg Oral BID   feeding supplement  237 mL Oral BID BM   mometasone-formoterol  2 puff Inhalation BID   montelukast  10 mg Oral QHS   pantoprazole (PROTONIX) IV  40 mg Intravenous Q12H   sodium chloride flush  3 mL Intravenous Q12H    PRN meds: acetaminophen **OR** acetaminophen, bisacodyl, diphenhydrAMINE **OR** diphenhydrAMINE, hydrALAZINE, HYDROcodone-acetaminophen, ipratropium, levalbuterol, morphine injection, ondansetron **OR** ondansetron (ZOFRAN) IV, senna-docusate, simethicone, traZODone   Infusions:   sodium chloride 75 mL/hr at 04/01/23 4098    Antimicrobials: Anti-infectives (From admission, onward)    Start     Dose/Rate Route Frequency Ordered Stop   03/26/23 2245  cefoTEtan (CEFOTAN) 2 g in sodium chloride 0.9 % 100 mL IVPB        2 g 200 mL/hr over 30 Minutes Intravenous Every 12 hours 03/26/23 2158 03/27/23 0056   03/26/23 0900  cefoTEtan (CEFOTAN) 2 g in sodium chloride 0.9 % 100 mL IVPB        2 g 200 mL/hr over 30 Minutes Intravenous  Once 03/25/23 1009 03/26/23 1038       Nutritional status:  Body mass index is 30.51 kg/m.          Objective: Vitals:   04/01/23 0345 04/01/23 0823  BP: (!) 113/56   Pulse: 84   Resp: 18   Temp: 98.5 F (36.9 C)   SpO2: 100% 100%    Intake/Output Summary (Last 24 hours) at 04/01/2023 1209 Last data filed at 04/01/2023 0945 Gross per 24 hour  Intake 2062.99 ml  Output --  Net 2062.99 ml   Filed Weights   03/20/23 2142  Weight: 85.7 kg   Weight change:  Body mass index is 30.51 kg/m.   Physical Exam: General exam: Pleasant elderly African-American female.  Not in distress Skin: No rashes, lesions or ulcers. HEENT: Atraumatic, normocephalic, no obvious bleeding Lungs: Clear to auscultation bilaterally CVS: Regular  rate and rhythm, no murmur GI/Abd soft, mild appropriate postsurgical tenderness, nondistended, bowel sound present CNS: Alert, awake, oriented x 3 Psychiatry: Mood appropriate Extremities: No pedal edema, no calf tenderness.  Data Review: I have personally reviewed the laboratory data and studies available.  F/u labs ordered Unresulted Labs (From admission, onward)    None       Total time spent in review of  labs and imaging, patient evaluation, formulation of plan, documentation and communication with family: 45 minutes  Signed, Lorin Glass, MD [facility] 04/01/2023

## 2023-04-01 NOTE — Progress Notes (Signed)
Patient ID: [name], female   DOB: 11-02-1940, 82 y.o.   MRN: [id number] [facility] Surgery Progress Note  6 Days Post-Op  Subjective: CC-  Abdomen sore but no significant pain. Feels very tired.  Tolerating soft diet. Denies n/v. Passing flatus and has had several bloody BM's.  Got 1u pRBCs for hgb 6.6 last night  No tachycardia or hypotension.  Objective: Vital signs in last 24 hours: Temp:  [97.3 F (36.3 C)-98.5 F (36.9 C)] 98.5 F (36.9 C) (08/25 0345) Pulse Rate:  [57-87] 84 (08/25 0345) Resp:  [16-18] 18 (08/25 0345) BP: (101-115)/(42-61) 113/56 (08/25 0345) SpO2:  [95 %-100 %] 100 % (08/25 0823) Last BM Date : 03/31/23  Intake/Output from previous day: 08/24 0701 - 08/25 0700 In: 1823 [P.O.:480; I.V.:1041; Blood:302] Out: -  Intake/Output this shift: No intake/output data recorded.  PE: Gen:  Alert, NAD, pleasant Abd: soft, nondistended, mild right sided tenderness, incision cdi with staples present and no erythema or drainage  Lab Results:  Recent Labs    03/31/23 1842 04/01/23 0444  WBC 9.6 8.5  HGB 6.6* 7.3*  HCT 21.9* 23.3*  PLT 175 158   BMET Recent Labs    03/29/23 1102  NA 136  K 3.6  CL 111  CO2 18*  GLUCOSE 125*  BUN 15  CREATININE 0.89  CALCIUM 7.9*   PT/INR No results for input(s): "LABPROT", "INR" in the last 72 hours. CMP     Component Value Date/Time   NA 136 03/29/2023 1102   NA 143 04/06/2020 1327   K 3.6 03/29/2023 1102   CL 111 03/29/2023 1102   CO2 18 (L) 03/29/2023 1102   GLUCOSE 125 (H) 03/29/2023 1102   BUN 15 03/29/2023 1102   BUN 15 04/06/2020 1327   CREATININE 0.89 03/29/2023 1102   CREATININE 1.08 05/05/2011 1536   CALCIUM 7.9 (L) 03/29/2023 1102   PROT 4.6 (L) 03/24/2023 0422   ALBUMIN 2.2 (L) 03/24/2023 0422   AST 28 03/24/2023 0422   ALT 16 03/24/2023 0422   ALKPHOS 86 03/24/2023 0422   BILITOT 1.1 03/24/2023 0422   GFRNONAA >60 03/29/2023 1102   GFRNONAA 50 (L) 05/05/2011 1536   GFRAA 60  04/06/2020 1327   GFRAA 60 (L) 05/05/2011 1536   Lipase     Component Value Date/Time   LIPASE 29 11/22/2020 2212       Studies/Results: No results found.  Anti-infectives: Anti-infectives (From admission, onward)    Start     Dose/Rate Route Frequency Ordered Stop   03/26/23 2245  cefoTEtan (CEFOTAN) 2 g in sodium chloride 0.9 % 100 mL IVPB        2 g 200 mL/hr over 30 Minutes Intravenous Every 12 hours 03/26/23 2158 03/27/23 0056   03/26/23 0900  cefoTEtan (CEFOTAN) 2 g in sodium chloride 0.9 % 100 mL IVPB        2 g 200 mL/hr over 30 Minutes Intravenous  Once 03/25/23 1009 03/26/23 1038        Assessment/Plan POD#6 s/p Exploratory laparotomy, lysis of adhesions, right hemicolectomy with primary anastomosis by Dr. Corliss Skains for Ascending colon cancer by Dr. 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  - Bowel movement still bloody this morning. VSS. Hgb 7, Eliquis and Lovenox held.  Will cont  to monitor - Mobilize, PT recommend SNF initially and then [facility] PT. Patient's family concerned about going home and would like to consider SNF if possible. PT feels she is appropriate for SNF. TOC consult placed for assistance pursuing SNF options   FEN - soft diet. Boost. IVF per primary  VTE - SCDs, eliquis ID - Cefotetan peri-op, none currently.  Foley - Out POD 1, voiding   A. fib Arthritis Asthma COPD HTN HLD pacemaker secondary to tachy-brady syndrome Chronic diastolic HF with LF of 55-60% PE in 2024 on Eliquis Anemia Obesity GERD    LOS: 10 days    Vanita Panda, MD  Colorectal and General Surgery [facility] Surgery

## 2023-04-02 DIAGNOSIS — D649 Anemia, unspecified: Secondary | ICD-10-CM | POA: Diagnosis not present

## 2023-04-02 LAB — CBC
HCT: 22.2 % — ABNORMAL LOW (ref 36.0–46.0)
Hemoglobin: 6.8 g/dL — CL (ref 12.0–15.0)
MCH: 24.6 pg — ABNORMAL LOW (ref 26.0–34.0)
MCHC: 30.6 g/dL (ref 30.0–36.0)
MCV: 80.4 fL (ref 80.0–100.0)
Platelets: 206 10*3/uL (ref 150–400)
RBC: 2.76 MIL/uL — ABNORMAL LOW (ref 3.87–5.11)
RDW: 32.5 % — ABNORMAL HIGH (ref 11.5–15.5)
WBC: 8.1 10*3/uL (ref 4.0–10.5)
nRBC: 0.9 % — ABNORMAL HIGH (ref 0.0–0.2)

## 2023-04-02 LAB — BASIC METABOLIC PANEL
Anion gap: 10 (ref 5–15)
BUN: 20 mg/dL (ref 8–23)
CO2: 19 mmol/L — ABNORMAL LOW (ref 22–32)
Calcium: 7.9 mg/dL — ABNORMAL LOW (ref 8.9–10.3)
Chloride: 115 mmol/L — ABNORMAL HIGH (ref 98–111)
Creatinine, Ser: 0.78 mg/dL (ref 0.44–1.00)
GFR, Estimated: >60 mL/min (ref >60)
Glucose, Bld: 94 mg/dL (ref 70–99)
Potassium: 3.7 mmol/L (ref 3.5–5.1)
Sodium: 144 mmol/L (ref 135–145)

## 2023-04-02 LAB — PREPARE RBC (CROSSMATCH)

## 2023-04-02 MED ORDER — SODIUM CHLORIDE 0.9% IV SOLUTION: Freq: Once | INTRAVENOUS | Status: AC

## 2023-04-02 NOTE — Plan of Care (Signed)
  Problem: Activity: Goal: Risk for activity intolerance will decrease Outcome: Progressing   Problem: Safety: Goal: Ability to remain free from injury will improve Outcome: Progressing   Problem: Skin Integrity: Goal: Risk for impaired skin integrity will decrease Outcome: Progressing   

## 2023-04-02 NOTE — Progress Notes (Signed)
PT Cancellation Note  Patient Details Name: [name] MRN: [id number] DOB: [birth year]   Cancelled Treatment:    Reason Eval/Treat Not Completed: Medical issues which prohibited therapy, getting blood.  Blanchard Kelch PT Acute Rehabilitation Services Office [phone] Weekend [phone]    Rada Hay 04/02/2023, 3:54 PM

## 2023-04-02 NOTE — Progress Notes (Signed)
Progress Note  7 Days Post-Op  Subjective: Pt denies abdominal pain, nausea or vomiting. She is having bowel movements but is not sure if she has had any further bleeding. No family at bedside this AM.   Objective: Vital signs in last 24 hours: Temp:  [97.7 F (36.5 C)-98.2 F (36.8 C)] 97.8 F (36.6 C) (08/26 0419) Pulse Rate:  [70-76] 70 (08/26 0419) Resp:  [16-18] 16 (08/26 0419) BP: (121-130)/(46-62) 124/46 (08/26 0419) SpO2:  [99 %-100 %] 99 % (08/26 0812) Last BM Date : 04/02/23  Intake/Output from previous day: 08/25 0701 - 08/26 0700 In: 2886.1 [P.O.:1570; I.V.:1316.1] Out: -  Intake/Output this shift: No intake/output data recorded.  PE: General: pleasant, WD, elderly female who is laying in bed in NAD Heart: regular, rate, and rhythm.   Lungs:  Respiratory effort nonlabored Abd: soft, NT, ND, incision C/D/I with staples present    Lab Results:  Recent Labs    03/31/23 1842 04/01/23 0444  WBC 9.6 8.5  HGB 6.6* 7.3*  HCT 21.9* 23.3*  PLT 175 158   BMET No results for input(s): "NA", "K", "CL", "CO2", "GLUCOSE", "BUN", "CREATININE", "CALCIUM" in the last 72 hours. PT/INR No results for input(s): "LABPROT", "INR" in the last 72 hours. CMP     Component Value Date/Time   NA 136 03/29/2023 1102   NA 143 04/06/2020 1327   K 3.6 03/29/2023 1102   CL 111 03/29/2023 1102   CO2 18 (L) 03/29/2023 1102   GLUCOSE 125 (H) 03/29/2023 1102   BUN 15 03/29/2023 1102   BUN 15 04/06/2020 1327   CREATININE 0.89 03/29/2023 1102   CREATININE 1.08 05/05/2011 1536   CALCIUM 7.9 (L) 03/29/2023 1102   PROT 4.6 (L) 03/24/2023 0422   ALBUMIN 2.2 (L) 03/24/2023 0422   AST 28 03/24/2023 0422   ALT 16 03/24/2023 0422   ALKPHOS 86 03/24/2023 0422   BILITOT 1.1 03/24/2023 0422   GFRNONAA >60 03/29/2023 1102   GFRNONAA 50 (L) 05/05/2011 1536   GFRAA 60 04/06/2020 1327   GFRAA 60 (L) 05/05/2011 1536   Lipase     Component Value Date/Time   LIPASE 29 11/22/2020 2212        Studies/Results: No results found.  Anti-infectives: Anti-infectives (From admission, onward)    Start     Dose/Rate Route Frequency Ordered Stop   03/26/23 2245  cefoTEtan (CEFOTAN) 2 g in sodium chloride 0.9 % 100 mL IVPB        2 g 200 mL/hr over 30 Minutes Intravenous Every 12 hours 03/26/23 2158 03/27/23 0056   03/26/23 0900  cefoTEtan (CEFOTAN) 2 g in sodium chloride 0.9 % 100 mL IVPB        2 g 200 mL/hr over 30 Minutes Intravenous  Once 03/25/23 1009 03/26/23 1038        Assessment/Plan  POD#7 s/p Exploratory laparotomy, lysis of adhesions, right hemicolectomy with primary anastomosis by Dr. Corliss Skains for Ascending colon cancer by Dr. 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  - unsure if continuing to have bloody BMs still, suspect anastomotic bleeding which should be self-limited with holding anticoagulation. Hgb pending this AM, was 7.3 yesterday AM. S/p 1 PRBC 8/24. Would hold anticoagulation until hgb stable for at least 24 hrs  - Mobilize, PT recommend SNF  initially and then [facility] PT. Patient's family concerned about going home and would like to consider SNF if possible. PT feels she is appropriate for SNF. TOC consult placed for assistance pursuing SNF options   FEN - soft diet. Boost. IVF per primary  VTE - SCDs, eliquis on hold ID - Cefotetan peri-op, none currently.  Foley - Out POD 1, voiding   A. 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   Juliet Rude,  [facility] 04/02/2023, 9:55 AM Please see Amion for pager number during day hours 7:00am-4:30pm

## 2023-04-02 NOTE — TOC Progression Note (Signed)
Transition of Care [facility]) - Progression Note    Patient Details  Name: [name] MRN: [id number] Date of Birth: 12-Mar-1941  Transition of Care [facility]) CM/SW Contact  Larrie Kass, LCSW Phone Number: 04/02/2023, 4:45 PM  Clinical Narrative:     Pt has chosen [facility] and rehab, CSW to start insurance auth close to medical stability. TOC to follow    Barriers to Discharge: Continued Medical Work up  Expected Discharge Plan and Services In-house Referral: Clinical Social Work     Living arrangements for the past 2 months: Hotel/Motel                 DME Arranged: Walker rolling DME Agency: [employer] Date DME Agency Contacted: 03/29/23 Time DME Agency Contacted: 1056 Representative spoke with at DME Agency: Zack HH Arranged: PT, OT, Nurse's Aide HH Agency: [facility] Date [facility] Agency Contacted: 03/29/23 Time HH Agency Contacted: 1056 Representative spoke with at  [facility] Agency: kelly   Social Determinants of Health (SDOH) Interventions SDOH Screenings   Food Insecurity: No Food Insecurity (03/21/2023)  Housing: Low Risk  (03/21/2023)  Transportation Needs: No Transportation Needs (03/21/2023)  Utilities: Not At Risk (03/21/2023)  Depression (PHQ2-9): Low Risk  (05/25/2022)  Financial Resource Strain: Low Risk  (01/19/2022)  Stress: No Stress Concern Present (01/19/2022)  Tobacco Use: Medium Risk (03/26/2023)    Readmission Risk Interventions    03/23/2023    3:27 PM  Readmission Risk Prevention Plan  Transportation Screening Complete  PCP or Specialist Appt within 5-7 Days Complete  Home Care Screening Complete  Medication Review (RN CM) Complete

## 2023-04-02 NOTE — Progress Notes (Signed)
PROGRESS NOTE  [name]  DOB: 1941-06-02  PCP: Henderson Cloud, MD ZOX:[id number]  DOA: 03/20/2023  LOS: 11 days  Hospital Day: 14  Brief narrative: [name] is a 82 y.o. female with PMH significant for HTN, HLD, CHF, paroxysmal A-fib s/p pacemaker, PE 12/2022 on Eliquis anemia, COPD, GERD, arthritis. 8/13, patient presented to the ED with complaint of shortness of breath, wheezing along with dark sticky stools.  In the ED, patient had a hemoglobin of 7.1. CT abdomen showed proximal ascending colon mass, likely malignant. Admitted to [facility] GI and general surgery were consulted 8/16, colonoscopy showed nearly obstructing colon mass 8/19, underwent exploratory ectomy with extended right hemicolectomy and primary anastomosis. 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  Subjective: Patient was seen and examined this morning. Lying on bed.  Not in distress.  Continues to have blood mixed bowel movement.  Noted drop in hemoglobin again today.  Anticoagulation on hold.  Assessment and plan: Newly diagnosed colon cancer S/p extended right hemicolectomy -8/19 Dr. Corliss Skains Postop ileus resolved.  Currently on soft diet. Pathology showed high-grade invasive adenocarcinoma the colon. CT chest without metastasis Oncology evaluation as an outpatient may be needed.   Acute symptomatic anemia Secondary to blood loss from colon cancer.  Was also on anticoagulation for history of A-fib and recent PE. Hemoglobin was low at 7.1 on admission.  Received 2 units of PRBC transfusion.  EGD unremarkable. 8/23, patient started having bloody bowel movement.  Hemoglobin down trended to the lowest of 6.6 and was given 1 unit of PRBC transfusion.  Continues to have blood mixed bowel movement.  Hemoglobin dropped again today to 6.8.  1 more unit of PRBC transfusion ordered.   Eliquis remain on hold General Surgery following. Recent Labs    12/08/22 0430  12/09/22 0351 03/21/23 1212 03/22/23 0414 03/31/23 0440 03/31/23 1041 03/31/23 1842 04/01/23 0444 04/02/23 1058  HGB 9.1*   < > 10.8*   < > 7.0* 7.3* 6.6* 7.3* 6.8*  MCV 78.2*   < > 73.3*   < > 74.2* 77.2* 74.5* 77.2* 80.4  VITAMINB12  --   --  906  --   --   --   --   --   --   FOLATE  --   --  10.2  --   --   --   --   --   --   FERRITIN 8*  --  7*  --   --   --   --   --   --   TIBC 363  --  314  --   --   --   --   --   --   IRON 20*  --  19*  --   --   --   --   --   --    < > = values in this interval not displayed.   Recent PE May 2024, CT angio chest showed few small peripheral segmental right-sided pulm emboli.  Ultrasound duplex lower extremities were negative for DVT.  Prior to that, patient was already on Coumadin but was not consistent compliant.  Following PE, patient was switched from Coumadin to Eliquis.  Eliquis which was held this admission but was resumed again after surgery.  Since patient started having bloody bowel movement, it is currently on hold again.  Paroxysmal A-fib Tachybradycardia syndrome s/p PPM Anticoagulation plan as above   Chronic diastolic heart failure  Euvolemic.   Most recent echo from May 2024 with EF 55 to 60%, grade 1 diastolic dysfunction Blood pressure running low normal.  Continue gentle hydration   GERD on Protonix   Obesity  Body mass index is 30.51 kg/m. Patient has been advised to make an attempt to improve diet and exercise patterns to aid in weight loss.    Mobility: Encourage ambulation.  Goals of care   Code Status: Full Code     DVT prophylaxis:  SCD's Start: 03/26/23 2159 SCDs Start: 03/21/23 0911   Antimicrobials: None currently Fluid: Stop IV fluid Consultants: General surgery Family Communication: Son not at bedside today  Status: Inpatient  Level of care:  Med-Surg   Patient is from: Home Needs to continue in-hospital care: Continues to have BRBPR Anticipated d/c to: Pending clinical course,  pending surgical clearance for discharge   Diet:  Diet Order             DIET SOFT Fluid consistency: Thin  Diet effective now                   Scheduled Meds:  acetaminophen  1,000 mg Oral Q6H   atorvastatin  20 mg Oral Daily   docusate sodium  100 mg Oral BID   feeding supplement  237 mL Oral BID BM   mometasone-formoterol  2 puff Inhalation BID   montelukast  10 mg Oral QHS   pantoprazole (PROTONIX) IV  40 mg Intravenous Q12H   sodium chloride flush  3 mL Intravenous Q12H    PRN meds: acetaminophen **OR** acetaminophen, bisacodyl, diphenhydrAMINE **OR** diphenhydrAMINE, hydrALAZINE, HYDROcodone-acetaminophen, ipratropium, levalbuterol, morphine injection, ondansetron **OR** ondansetron (ZOFRAN) IV, senna-docusate, simethicone, traZODone   Infusions:     Antimicrobials: Anti-infectives (From admission, onward)    Start     Dose/Rate Route Frequency Ordered Stop   03/26/23 2245  cefoTEtan (CEFOTAN) 2 g in sodium chloride 0.9 % 100 mL IVPB        2 g 200 mL/hr over 30 Minutes Intravenous Every 12 hours 03/26/23 2158 03/27/23 0056   03/26/23 0900  cefoTEtan (CEFOTAN) 2 g in sodium chloride 0.9 % 100 mL IVPB        2 g 200 mL/hr over 30 Minutes Intravenous  Once 03/25/23 1009 03/26/23 1038       Nutritional status:  Body mass index is 30.51 kg/m.          Objective: Vitals:   04/02/23 1304 04/02/23 1347  BP: (!) 108/50 110/60  Pulse: 69 79  Resp: 14 20  Temp: 98 F (36.7 C) 97.8 F (36.6 C)  SpO2: 100% 100%    Intake/Output Summary (Last 24 hours) at 04/02/2023 1348 Last data filed at 04/02/2023 0600 Gross per 24 hour  Intake 2364.83 ml  Output --  Net 2364.83 ml   Filed Weights   03/20/23 2142  Weight: 85.7 kg   Weight change:  Body mass index is 30.51 kg/m.   Physical Exam: General exam: Pleasant elderly African-American female.  Not in distress Skin: No rashes, lesions or ulcers. HEENT: Atraumatic, normocephalic, no obvious  bleeding Lungs: Clear to auscultation bilaterally CVS: Regular rate and rhythm, no murmur GI/Abd soft, mild appropriate postsurgical tenderness, nondistended, bowel sound present CNS: Alert, awake, oriented x 3 Psychiatry: Mood appropriate Extremities: No pedal edema, no calf tenderness.  Data Review: I have personally reviewed the laboratory data and studies available.  F/u labs ordered [employer] (From admission, onward)     Start  Ordered   04/03/23 0500  Basic metabolic panel  Daily,   R     Question:  Specimen collection method  Answer:  Lab=Lab collect   04/02/23 0851   04/03/23 0500  CBC with Differential/Platelet  Daily,   R     Question:  Specimen collection method  Answer:  Lab=Lab collect   04/02/23 0851            Total time spent in review of labs and imaging, patient evaluation, formulation of plan, documentation and communication with family: 45 minutes  Signed, Lorin Glass, MD [facility] 04/02/2023

## 2023-04-03 DIAGNOSIS — D649 Anemia, unspecified: Secondary | ICD-10-CM | POA: Diagnosis not present

## 2023-04-03 LAB — CBC WITH DIFFERENTIAL/PLATELET
Abs Immature Granulocytes: 0.31 10*3/uL — ABNORMAL HIGH (ref 0.00–0.07)
Basophils Absolute: 0 10*3/uL (ref 0.0–0.1)
Basophils Relative: 1 %
Eosinophils Absolute: 0.3 10*3/uL (ref 0.0–0.5)
Eosinophils Relative: 4 %
HCT: 27.2 % — ABNORMAL LOW (ref 36.0–46.0)
Hemoglobin: 8.4 g/dL — ABNORMAL LOW (ref 12.0–15.0)
Immature Granulocytes: 4 %
Lymphocytes Relative: 23 %
Lymphs Abs: 1.8 10*3/uL (ref 0.7–4.0)
MCH: 25.6 pg — ABNORMAL LOW (ref 26.0–34.0)
MCHC: 30.9 g/dL (ref 30.0–36.0)
MCV: 82.9 fL (ref 80.0–100.0)
Monocytes Absolute: 1.1 10*3/uL — ABNORMAL HIGH (ref 0.1–1.0)
Monocytes Relative: 14 %
Neutro Abs: 4.2 10*3/uL (ref 1.7–7.7)
Neutrophils Relative %: 54 %
Platelets: 219 10*3/uL (ref 150–400)
RBC: 3.28 MIL/uL — ABNORMAL LOW (ref 3.87–5.11)
RDW: 30.7 % — ABNORMAL HIGH (ref 11.5–15.5)
WBC: 7.7 10*3/uL (ref 4.0–10.5)
nRBC: 2.3 % — ABNORMAL HIGH (ref 0.0–0.2)

## 2023-04-03 LAB — TYPE AND SCREEN
ABO/RH(D): O POS
Antibody Screen: NEGATIVE
Unit division: 0
Unit division: 0

## 2023-04-03 LAB — BASIC METABOLIC PANEL
Anion gap: 9 (ref 5–15)
BUN: 21 mg/dL (ref 8–23)
CO2: 19 mmol/L — ABNORMAL LOW (ref 22–32)
Calcium: 7.9 mg/dL — ABNORMAL LOW (ref 8.9–10.3)
Chloride: 112 mmol/L — ABNORMAL HIGH (ref 98–111)
Creatinine, Ser: 0.89 mg/dL (ref 0.44–1.00)
GFR, Estimated: >60 mL/min (ref >60)
Glucose, Bld: 96 mg/dL (ref 70–99)
Potassium: 3.3 mmol/L — ABNORMAL LOW (ref 3.5–5.1)
Sodium: 140 mmol/L (ref 135–145)

## 2023-04-03 LAB — BPAM RBC
Blood Product Expiration Date: 202409112359
Blood Product Expiration Date: 202409232359
ISSUE DATE / TIME: 202408242307
ISSUE DATE / TIME: 202408261340
Unit Type and Rh: 5100
Unit Type and Rh: 5100

## 2023-04-03 MED ORDER — POTASSIUM CHLORIDE CRYS ER 20 MEQ PO TBCR
40.0000 meq | EXTENDED_RELEASE_TABLET | Freq: Once | ORAL | Status: AC
  Administered 2023-04-03: 40 meq via ORAL
  Filled 2023-04-03: qty 2

## 2023-04-03 NOTE — Plan of Care (Signed)
  Problem: Elimination: Goal: Will not experience complications related to bowel motility Outcome: Progressing Goal: Will not experience complications related to urinary retention Outcome: Progressing   Problem: Pain Managment: Goal: General experience of comfort will improve Outcome: Progressing   

## 2023-04-03 NOTE — TOC Progression Note (Signed)
Transition of Care [facility]) - Progression Note    Patient Details  Name: [name] MRN: [id number] Date of Birth: 1941-03-01  Transition of Care [facility]) CM/SW Contact  Larrie Kass, LCSW Phone Number: 04/03/2023, 2:57 PM  Clinical Narrative:    CSW spoke to Star with [facility], to informed her of bed acceptance. Star with start insurance authorization. TOC to follow.    Expected Discharge Plan: Home w Home Health Services Barriers to Discharge: Continued Medical Work up  Expected Discharge Plan and Services In-house Referral: Clinical Social Work     Living arrangements for the past 2 months: Hotel/Motel                 DME Arranged: Dan Humphreys rolling DME Agency: [employer] Date DME Agency Contacted: 03/29/23 Time DME Agency Contacted: 1056 Representative spoke with at DME Agency: Zack HH Arranged: PT, OT, Nurse's Aide HH Agency: [facility] Date [facility] Agency Contacted: 03/29/23 Time HH Agency Contacted: 1056 Representative spoke with at [facility] Agency: kelly   Social Determinants of Health (SDOH) Interventions SDOH Screenings   Food Insecurity: No Food Insecurity (03/21/2023)  Housing: Low Risk  (03/21/2023)  Transportation Needs: No Transportation Needs (03/21/2023)  Utilities: Not At Risk (03/21/2023)  Depression (PHQ2-9): Low Risk  (05/25/2022)  Financial Resource Strain: Low Risk  (01/19/2022)  Stress: No Stress Concern Present (01/19/2022)  Tobacco Use: Medium Risk (03/26/2023)    Readmission Risk Interventions    03/23/2023    3:27 PM  Readmission Risk Prevention Plan  Transportation Screening Complete  PCP or Specialist Appt within 5-7 Days Complete  Home Care Screening Complete  Medication Review (RN CM) Complete

## 2023-04-03 NOTE — Plan of Care (Signed)
  Problem: Activity: Goal: Risk for activity intolerance will decrease Outcome: Progressing   Problem: Nutrition: Goal: Adequate nutrition will be maintained Outcome: Progressing   Problem: Pain Managment: Goal: General experience of comfort will improve Outcome: Progressing   Problem: Skin Integrity: Goal: Risk for impaired skin integrity will decrease Outcome: Progressing   Problem: Education: Goal: Knowledge of General Education information will improve Description: Including pain rating scale, medication(s)/side effects and non-pharmacologic comfort measures Outcome: Adequate for Discharge   Problem: Health Behavior/Discharge Planning: Goal: Ability to manage health-related needs will improve Outcome: Adequate for Discharge   Problem: Clinical Measurements: Goal: Will remain free from infection Outcome: Adequate for Discharge Goal: Diagnostic test results will improve Outcome: Adequate for Discharge Goal: Cardiovascular complication will be avoided Outcome: Adequate for Discharge   Problem: Coping: Goal: Level of anxiety will decrease Outcome: Adequate for Discharge   Problem: Elimination: Goal: Will not experience complications related to bowel motility Outcome: Adequate for Discharge Goal: Will not experience complications related to urinary retention Outcome: Adequate for Discharge   Problem: Safety: Goal: Ability to remain free from injury will improve Outcome: Adequate for Discharge

## 2023-04-03 NOTE — Progress Notes (Signed)
PROGRESS NOTE  [name]  DOB: May 28, 1941  PCP: Lisa Cloud, MD KVQ:[id number]  DOA: 03/20/2023  LOS: 12 days  Hospital Day: 15  Brief narrative: [name] is a 82 y.o. female with PMH significant for HTN, HLD, CHF, paroxysmal A-fib s/p pacemaker, PE 12/2022 on Eliquis anemia, COPD, GERD, arthritis. 8/13, patient presented to the ED with complaint of shortness of breath, wheezing along with dark sticky stools.  In the ED, patient had a hemoglobin of 7.1. CT abdomen showed proximal ascending colon mass, likely malignant. Admitted to [facility] GI and general surgery were consulted 8/16, colonoscopy showed nearly obstructing colon mass 8/19, underwent exploratory ectomy with extended right hemicolectomy and primary anastomosis. 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  Subjective: Patient was seen and examined this morning. Lying on bed.  Not in distress.  Son at bedside.  Patient and family reports that patient is no longer passing blood in stool.  Assessment and plan: Newly diagnosed colon cancer S/p extended right hemicolectomy -8/19 Dr. Corliss Farmer Postop ileus resolved.  Currently on soft diet. Pathology showed high-grade invasive adenocarcinoma the colon. CT chest without metastasis Oncology evaluation as an outpatient may be needed.   Acute symptomatic anemia Secondary to blood loss from colon cancer.  Was also on anticoagulation for history of A-fib and recent PE. Hemoglobin was low at 7.1 on admission.  Received 2 units of PRBC transfusion.  EGD unremarkable. 8/23, patient started having bloody bowel movement.  Hemoglobin started to drop as well.  Received 2 units of PRBC transfusion.  Rectal bleeding has stopped.  Hemoglobin better today at 8.4.  Continue to monitor clinically.  If hemoglobin tomorrow remains stable, then plan to discharge to SNF.  Eliquis remains on hold. General Surgery following. Recent Labs     12/08/22 0430 12/09/22 0351 03/21/23 1212 03/22/23 0414 03/31/23 1041 03/31/23 1842 04/01/23 0444 04/02/23 1058 04/03/23 0417  HGB 9.1*   < > 10.8*   < > 7.3* 6.6* 7.3* 6.8* 8.4*  MCV 78.2*   < > 73.3*   < > 77.2* 74.5* 77.2* 80.4 82.9  VITAMINB12  --   --  906  --   --   --   --   --   --   FOLATE  --   --  10.2  --   --   --   --   --   --   FERRITIN 8*  --  7*  --   --   --   --   --   --   TIBC 363  --  314  --   --   --   --   --   --   IRON 20*  --  19*  --   --   --   --   --   --    < > = values in this interval not displayed.   Recent PE May 2024, CT angio chest showed few small peripheral segmental right-sided pulm emboli.  Ultrasound duplex lower extremities were negative for DVT.  Prior to that, patient was already on Coumadin but was not consistent compliant.  Following PE, patient was switched from Coumadin to Eliquis.  Eliquis which was held this admission but was resumed again after surgery.  Since patient started having bloody bowel movement, it is currently on hold again.  Paroxysmal A-fib Tachybradycardia syndrome s/p PPM Anticoagulation plan as above   Chronic diastolic heart failure  Euvolemic.   Most recent echo from May 2024 with EF 55 to 60%, grade 1 diastolic dysfunction Blood pressure running low normal.  Continue gentle hydration   GERD on Protonix   Obesity  Body mass index is 30.51 kg/m. Patient has been advised to make an attempt to improve diet and exercise patterns to aid in weight loss.    Mobility: Encourage ambulation.  Goals of care   Code Status: Full Code     DVT prophylaxis:  SCD's Start: 03/26/23 2159 SCDs Start: 03/21/23 0911   Antimicrobials: None currently Fluid: Stop IV fluid Consultants: General surgery Family Communication: Son not at bedside today  Status: Inpatient  Level of care:  Med-Surg   Patient is from: Home Needs to continue in-hospital care: BRBPR has stopped.  Hemoglobin stable, plan to discharge to  SNF tomorrow Anticipated d/c to: Pending clinical course, pending surgical clearance for discharge   Diet:  Diet Order             DIET SOFT Fluid consistency: Thin  Diet effective now                   Scheduled Meds:  acetaminophen  1,000 mg Oral Q6H   atorvastatin  20 mg Oral Daily   docusate sodium  100 mg Oral BID   feeding supplement  237 mL Oral BID BM   mometasone-formoterol  2 puff Inhalation BID   montelukast  10 mg Oral QHS   pantoprazole (PROTONIX) IV  40 mg Intravenous Q12H   sodium chloride flush  3 mL Intravenous Q12H    PRN meds: acetaminophen **OR** acetaminophen, bisacodyl, diphenhydrAMINE **OR** diphenhydrAMINE, hydrALAZINE, HYDROcodone-acetaminophen, ipratropium, levalbuterol, morphine injection, ondansetron **OR** ondansetron (ZOFRAN) IV, senna-docusate, simethicone, traZODone   Infusions:     Antimicrobials: Anti-infectives (From admission, onward)    Start     Dose/Rate Route Frequency Ordered Stop   03/26/23 2245  cefoTEtan (CEFOTAN) 2 g in sodium chloride 0.9 % 100 mL IVPB        2 g 200 mL/hr over 30 Minutes Intravenous Every 12 hours 03/26/23 2158 03/27/23 0056   03/26/23 0900  cefoTEtan (CEFOTAN) 2 g in sodium chloride 0.9 % 100 mL IVPB        2 g 200 mL/hr over 30 Minutes Intravenous  Once 03/25/23 1009 03/26/23 1038       Nutritional status:  Body mass index is 30.51 kg/m.          Objective: Vitals:   04/03/23 0900 04/03/23 1140  BP: (!) 108/54 (!) 101/53  Pulse: 77 70  Resp:  18  Temp: 97.7 F (36.5 C) 98 F (36.7 C)  SpO2: 100% 100%    Intake/Output Summary (Last 24 hours) at 04/03/2023 1432 Last data filed at 04/02/2023 1800 Gross per 24 hour  Intake 416 ml  Output --  Net 416 ml   Filed Weights   03/20/23 2142  Weight: 85.7 kg   Weight change:  Body mass index is 30.51 kg/m.   Physical Exam: General exam: Pleasant elderly African-American female.  Not in distress Skin: No rashes, lesions or  ulcers. HEENT: Atraumatic, normocephalic, no obvious bleeding Lungs: Clear to auscultation bilaterally CVS: Regular rate and rhythm, no murmur GI/Abd soft, mild appropriate postsurgical tenderness, nondistended, bowel sound present CNS: Alert, awake, oriented x 3 Psychiatry: Mood appropriate Extremities: No pedal edema, no calf tenderness.  Data Review: I have personally reviewed the laboratory data and studies available.  F/u labs ordered [employer] (From  admission, onward)     Start     Ordered   04/03/23 0500  Basic metabolic panel  Daily,   R     Question:  Specimen collection method  Answer:  Lab=Lab collect   04/02/23 0851   04/03/23 0500  CBC with Differential/Platelet  Daily,   R     Question:  Specimen collection method  Answer:  Lab=Lab collect   04/02/23 0851            Total time spent in review of labs and imaging, patient evaluation, formulation of plan, documentation and communication with family: 45 minutes  Signed, Lorin Glass, MD [facility] 04/03/2023

## 2023-04-03 NOTE — Progress Notes (Signed)
Progress Note  8 Days Post-Op  Subjective: Pt denies abdominal pain, nausea or vomiting. She tells me that she thinks she had a BM yesterday, unsure if it was bloody. Her son, from [place], is at the bedside.   Objective: Vital signs in last 24 hours: Temp:  [97.6 F (36.4 C)-98.2 F (36.8 C)] 97.7 F (36.5 C) (08/27 0900) Pulse Rate:  [69-81] 77 (08/27 0900) Resp:  [14-20] 18 (08/27 0400) BP: (107-117)/(49-61) 108/54 (08/27 0900) SpO2:  [99 %-100 %] 100 % (08/27 0900) Last BM Date : 04/02/23  Intake/Output from previous day: 08/26 0701 - 08/27 0700 In: 576 [P.O.:220; Blood:356] Out: -  Intake/Output this shift: No intake/output data recorded.  PE: General: pleasant, WD, elderly female who is laying in bed in NAD Heart: regular, rate, and rhythm.   Lungs:  Respiratory effort nonlabored Abd: soft, NT, ND, incision C/D/I with staples present    Lab Results:  Recent Labs    04/02/23 1058 04/03/23 0417  WBC 8.1 7.7  HGB 6.8* 8.4*  HCT 22.2* 27.2*  PLT 206 219   BMET Recent Labs    04/02/23 1058 04/03/23 0417  NA 144 140  K 3.7 3.3*  CL 115* 112*  CO2 19* 19*  GLUCOSE 94 96  BUN 20 21  CREATININE 0.78 0.89  CALCIUM 7.9* 7.9*   PT/INR No results for input(s): "LABPROT", "INR" in the last 72 hours. CMP     Component Value Date/Time   NA 140 04/03/2023 0417   NA 143 04/06/2020 1327   K 3.3 (L) 04/03/2023 0417   CL 112 (H) 04/03/2023 0417   CO2 19 (L) 04/03/2023 0417   GLUCOSE 96 04/03/2023 0417   BUN 21 04/03/2023 0417   BUN 15 04/06/2020 1327   CREATININE 0.89 04/03/2023 0417   CREATININE 1.08 05/05/2011 1536   CALCIUM 7.9 (L) 04/03/2023 0417   PROT 4.6 (L) 03/24/2023 0422   ALBUMIN 2.2 (L) 03/24/2023 0422   AST 28 03/24/2023 0422   ALT 16 03/24/2023 0422   ALKPHOS 86 03/24/2023 0422   BILITOT 1.1 03/24/2023 0422   GFRNONAA >60 04/03/2023 0417   GFRNONAA 50 (L) 05/05/2011 1536   GFRAA 60 04/06/2020 1327   GFRAA 60 (L) 05/05/2011 1536    Lipase     Component Value Date/Time   LIPASE 29 11/22/2020 2212       Studies/Results: No results found.  Anti-infectives: Anti-infectives (From admission, onward)    Start     Dose/Rate Route Frequency Ordered Stop   03/26/23 2245  cefoTEtan (CEFOTAN) 2 g in sodium chloride 0.9 % 100 mL IVPB        2 g 200 mL/hr over 30 Minutes Intravenous Every 12 hours 03/26/23 2158 03/27/23 0056   03/26/23 0900  cefoTEtan (CEFOTAN) 2 g in sodium chloride 0.9 % 100 mL IVPB        2 g 200 mL/hr over 30 Minutes Intravenous  Once 03/25/23 1009 03/26/23 1038        Assessment/Plan  POD#8 s/p Exploratory laparotomy, lysis of adhesions, right hemicolectomy with primary anastomosis by Dr. Corliss Skains for Ascending colon cancer by Dr. 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  - anticoagulation held due to blood in stool. S/p 1 PRBC 8/24. Hgb drifted to 6.8 yesterday and she  got another 1 u pRBC >> hgb 8.4 today, appropriate rise. Per RN the patient has not had a BM since Sunday and her BMs at that time were dark/tarry. Clinically the bleeding seems to be slowing down/stopping. Ok for [facility] or lovenox for DVT ppx today, would continue to hold Eliquis for now. Monitor CBC and bowel movements. - Mobilize, PT recommend SNF initially and then [facility] PT. Patient's family concerned about going home and would like to consider SNF if possible. PT feels she is appropriate for SNF. TOC consult placed for assistance pursuing SNF options and appears they are looking at [facility].   FEN - soft diet. Boost. IVF per primary  VTE - SCDs, eliquis on hold ID - Cefotetan peri-op, none currently.  Foley - Out POD 1, voiding   A. 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   Adam Phenix, [facility] Surgery 04/03/2023, 9:16 AM Please see Amion for pager number during day hours 7:00am-4:30pm

## 2023-04-03 NOTE — Progress Notes (Signed)
Occupational Therapy Treatment Patient Details Name: [name] MRN: [id number] DOB: 03-Apr-1941 Today's Date: 04/03/2023   History of present illness 82 yr old female admitted with shortness of breath and wheezing. She is s/p an exploratory laparotomy, and R hemicolectomy on 03/26/23, due to an umbilical hernia and colon mass. Hx of obesity, Afib, emphysema, CHF, pacemaker, colon mass, PE, anemia   OT comments  Patient progressing and showed improved bed mobility, standing and ambulation as evidenced by need of less assistance from Mod to Minimal compared to previous session.  Pt denied pain but is limited by fatigue and son reported that pt fatigues very quickly with ambulation. For this reason, pt was followed for recliner during ambulation for safety. Pt did report lightheadedness and assisted to chair with BP stable.  Patient remains limited by BLE edema with decreased LE strength esp to RLE, generalized weakness and decreased activity tolerance along with deficits noted below. Pt continues to demonstrate good rehab potential and would benefit from continued skilled OT to increase safety and independence with ADLs and functional transfers to allow pt to return home safely and reduce caregiver burden and fall risk.       If plan is discharge home, recommend the following:  Assist for transportation;Assistance with cooking/housework;A lot of help with bathing/dressing/bathroom;A little help with walking and/or transfers   Equipment Recommendations   (defer)    Recommendations for Other Services      Precautions / Restrictions Precautions Precautions: Fall Precaution Comments: abd surgery Required Braces or Orthoses: Other Brace Other Brace: abd binder Restrictions Weight Bearing Restrictions: No Other Position/Activity Restrictions: abdominal binder       Mobility Bed Mobility Overal bed mobility: Needs Assistance Bed Mobility: Rolling, Supine to Sit, Sit to Supine Rolling:  Min assist, Used rails (Cues for sequencing) Sidelying to sit: Min assist, Used rails (Increased time and effort, cues.)            Transfers                         Balance Overall balance assessment: Needs assistance Sitting-balance support: Feet supported, No upper extremity supported Sitting balance-Leahy Scale: Good     Standing balance support: During functional activity, Bilateral upper extremity supported, Reliant on assistive device for balance Standing balance-Leahy Scale: Poor                             ADL either performed or assessed with clinical judgement   ADL Overall ADL's : Needs assistance/impaired Eating/Feeding: Independent;Sitting Eating/Feeding Details (indicate cue type and reason): at chair level Grooming: Set up;Sitting;Wash/dry hands Grooming Details (indicate cue type and reason): at chair level               Lower Body Dressing Details (indicate cue type and reason): Son assisted pt with donning her crocs. Toilet Transfer: Minimal assistance;Rolling walker (2 wheels);Cueing for safety;Cueing for sequencing;Ambulation Toilet Transfer Details (indicate cue type and reason): Pt stood from EOB to RW with ABD binder donned in bed prior to mobilizing. Pt required cues and Minimal assist to stand.  Pt ambulated in hallway with physical therapy and OT follow with recliner for safety.  Pt reported some lightheadeness and was assisted to recliner with cues for hands and Min As to control descent.  BP taken and WNL. HR WNL.  Please see PT note for distance ambulated.  Functional mobility during ADLs: Minimal assistance;+2 for safety/equipment;Rolling walker (2 wheels)      Extremity/Trunk Assessment Upper Extremity Assessment Upper Extremity Assessment: Generalized weakness   Lower Extremity Assessment Lower Extremity Assessment: Generalized weakness   Cervical / Trunk Assessment Cervical / Trunk Assessment: Other  exceptions Cervical / Trunk Exceptions: Stooped forward with standing    Vision Baseline Vision/History: 1 Wears glasses Vision Assessment?: No apparent visual deficits   Perception     Praxis      Cognition Arousal: Alert Behavior During Therapy: WFL for tasks assessed/performed Overall Cognitive Status: Within Functional Limits for tasks assessed                                 General Comments: Pt asking her son a few questions for recall of recent events. Otherwise, no deficits noted.        Exercises      Shoulder Instructions       General Comments      Pertinent Vitals/ Pain       Pain Assessment Pain Assessment: No/denies pain  Home Living                                          Prior Functioning/Environment              Frequency  Min 1X/week        Progress Toward Goals  OT Goals(current goals can now be found in the care plan section)  Progress towards OT goals: Progressing toward goals  Acute Rehab OT Goals Patient Stated Goal: Increase activity tolerance for walking and self care OT Goal Formulation: With patient/family Time For Goal Achievement: 04/11/23 Potential to Achieve Goals: Good  Plan      Co-evaluation    PT/OT/SLP Co-Evaluation/Treatment: Yes Reason for Co-Treatment: For patient/therapist safety;To address functional/ADL transfers PT goals addressed during session: Mobility/safety with mobility OT goals addressed during session: ADL's and self-care      AM-PAC OT "6 Clicks" Daily Activity     Outcome Measure   Help from another person eating meals?: None Help from another person taking care of personal grooming?: A Little Help from another person toileting, which includes using toliet, bedpan, or urinal?: A Lot Help from another person bathing (including washing, rinsing, drying)?: A Lot Help from another person to put on and taking off regular upper body clothing?: A Little Help  from another person to put on and taking off regular lower body clothing?: A Lot 6 Click Score: 16    End of Session Equipment Utilized During Treatment: Rolling walker (2 wheels);Gait belt (ABD binder)  OT Visit Diagnosis: Unsteadiness on feet (R26.81);Muscle weakness (generalized) (M62.81)   Activity Tolerance Patient limited by fatigue   Patient Left in chair;with call bell/phone within reach;with family/visitor present   Nurse Communication Mobility status        Time: 0981-1914 OT Time Calculation (min): 32 min  Charges: OT General Charges $OT Visit: 1 Visit OT Treatments $Self Care/Home Management : 8-22 mins  Victorino Dike, OT Acute Rehab Services Office: [phone] 04/03/2023   Theodoro Clock 04/03/2023, 12:54 PM

## 2023-04-03 NOTE — Progress Notes (Signed)
Physical Therapy Treatment Patient Details Name: [name] MRN: [id number] DOB: 18-Nov-1940 Today's Date: 04/03/2023   History of Present Illness 82 yr old female admitted with shortness of breath and wheezing. She is s/p an exploratory laparotomy, and R hemicolectomy on 03/26/23, due to an umbilical hernia and colon mass. Hx of obesity, Afib, emphysema, CHF, pacemaker, colon mass, PE, anemia    PT Comments  Pt agreeable to working with therapy. She walked ~25 feet with a RW. Distance limited by fatigue, weakness, and pt report of dizziness-BP 130/60. Encouraged pt to sit up as tolerated. Patient will benefit from continued inpatient follow up therapy, <3 hours/day     If plan is discharge home, recommend the following: A little help with walking and/or transfers;A little help with bathing/dressing/bathroom;Assistance with cooking/housework;Assist for transportation;Help with stairs or ramp for entrance   Can travel by private vehicle     No  Equipment Recommendations       Recommendations for Other Services       Precautions / Restrictions Precautions Precautions: Fall Precaution Comments: abd surgery Required Braces or Orthoses: Other Brace Other Brace: abd binder Restrictions Weight Bearing Restrictions: No Other Position/Activity Restrictions: abdominal binder     Mobility  Bed Mobility Overal bed mobility: Needs Assistance Bed Mobility: Rolling, Supine to Sit, Sit to Supine Rolling: Min assist, Used rails Sidelying to sit: Min assist, Used rails       General bed mobility comments: Min A to get to EOB. Increased time. Cues provided.    Transfers Overall transfer level: Needs assistance Equipment used: Rolling walker (2 wheels) Transfers: Sit to/from Stand Sit to Stand: Min assist, From elevated surface           General transfer comment: Required 1 verbal cue to push up from bed with at least 1 upper extremity. Min A to rise.     Ambulation/Gait Ambulation/Gait assistance: Min assist Gait Distance (Feet): 25 Feet Assistive device: Rolling walker (2 wheels) Gait Pattern/deviations: Step-through pattern, Decreased stride length       General Gait Details: Min A to steady. Slow gait speed. Distance limited by fatigue, weakness, and pt report of some dizziness. BP 130/60. Transported back to bedside in recliner.   Stairs             Wheelchair Mobility     Tilt Bed    Modified Rankin (Stroke Patients Only)       Balance Overall balance assessment: Needs assistance         Standing balance support: During functional activity, Bilateral upper extremity supported, Reliant on assistive device for balance Standing balance-Leahy Scale: Poor                              Cognition Arousal: Alert Behavior During Therapy: WFL for tasks assessed/performed Overall Cognitive Status: Within Functional Limits for tasks assessed                                 General Comments: Pt asking her son a few questions for recall of recent events. Otherwise, no deficits noted.        Exercises      General Comments        Pertinent Vitals/Pain Pain Assessment Pain Assessment: Faces Faces Pain Scale: Hurts a little bit Pain Location: abdomen Pain Descriptors / Indicators: Discomfort, Sore Pain Intervention(s): Monitored during session,  Limited activity within patient's tolerance, Repositioned    Home Living                          Prior Function            PT Goals (current goals can now be found in the care plan section) Progress towards PT goals: Progressing toward goals    Frequency    Min 1X/week      PT Plan      Co-evaluation   Reason for Co-Treatment: For patient/therapist safety;To address functional/ADL transfers PT goals addressed during session: Mobility/safety with mobility OT goals addressed during session: ADL's and self-care       AM-PAC PT "6 Clicks" Mobility   Outcome Measure  Help needed turning from your back to your side while in a flat bed without using bedrails?: A Little Help needed moving from lying on your back to sitting on the side of a flat bed without using bedrails?: A Little Help needed moving to and from a bed to a chair (including a wheelchair)?: A Little Help needed standing up from a chair using your arms (e.g., wheelchair or bedside chair)?: A Little Help needed to walk in hospital room?: A Lot Help needed climbing 3-5 steps with a railing? : A Lot 6 Click Score: 16    End of Session Equipment Utilized During Treatment: Gait belt Activity Tolerance: Patient limited by fatigue Patient left: in chair;with call bell/phone within reach;with family/visitor present;with chair alarm set   PT Visit Diagnosis: Muscle weakness (generalized) (M62.81);Difficulty in walking, not elsewhere classified (R26.2)     Time: 7829-5621 PT Time Calculation (min) (ACUTE ONLY): 15 min  Charges:    $Gait Training: 8-22 mins PT General Charges $$ ACUTE PT VISIT: 1 Visit                        Faye Ramsay, PT Acute Rehabilitation  Office: [phone]

## 2023-04-03 NOTE — Plan of Care (Signed)

## 2023-04-04 DIAGNOSIS — D649 Anemia, unspecified: Secondary | ICD-10-CM | POA: Diagnosis not present

## 2023-04-04 LAB — CBC WITH DIFFERENTIAL/PLATELET
Abs Immature Granulocytes: 0.11 10*3/uL — ABNORMAL HIGH (ref 0.00–0.07)
Basophils Absolute: 0 10*3/uL (ref 0.0–0.1)
Basophils Relative: 0 %
Eosinophils Absolute: 0.3 10*3/uL (ref 0.0–0.5)
Eosinophils Relative: 3 %
HCT: 25.2 % — ABNORMAL LOW (ref 36.0–46.0)
Hemoglobin: 7.7 g/dL — ABNORMAL LOW (ref 12.0–15.0)
Immature Granulocytes: 1 %
Lymphocytes Relative: 12 %
Lymphs Abs: 1.1 10*3/uL (ref 0.7–4.0)
MCH: 25.6 pg — ABNORMAL LOW (ref 26.0–34.0)
MCHC: 30.6 g/dL (ref 30.0–36.0)
MCV: 83.7 fL (ref 80.0–100.0)
Monocytes Absolute: 0.9 10*3/uL (ref 0.1–1.0)
Monocytes Relative: 10 %
Neutro Abs: 6.6 10*3/uL (ref 1.7–7.7)
Neutrophils Relative %: 74 %
Platelets: 279 10*3/uL (ref 150–400)
RBC: 3.01 MIL/uL — ABNORMAL LOW (ref 3.87–5.11)
RDW: 31.6 % — ABNORMAL HIGH (ref 11.5–15.5)
WBC: 9 10*3/uL (ref 4.0–10.5)
nRBC: 0.6 % — ABNORMAL HIGH (ref 0.0–0.2)

## 2023-04-04 LAB — BASIC METABOLIC PANEL
Anion gap: 7 (ref 5–15)
BUN: 19 mg/dL (ref 8–23)
CO2: 20 mmol/L — ABNORMAL LOW (ref 22–32)
Calcium: 8 mg/dL — ABNORMAL LOW (ref 8.9–10.3)
Chloride: 116 mmol/L — ABNORMAL HIGH (ref 98–111)
Creatinine, Ser: 0.84 mg/dL (ref 0.44–1.00)
GFR, Estimated: >60 mL/min (ref >60)
Glucose, Bld: 116 mg/dL — ABNORMAL HIGH (ref 70–99)
Potassium: 3.5 mmol/L (ref 3.5–5.1)
Sodium: 143 mmol/L (ref 135–145)

## 2023-04-04 MED ORDER — ENOXAPARIN SODIUM 40 MG/0.4ML IJ SOSY
40.0000 mg | PREFILLED_SYRINGE | INTRAMUSCULAR | Status: DC
  Administered 2023-04-04: 40 mg via SUBCUTANEOUS
  Filled 2023-04-04: qty 0.4

## 2023-04-04 MED ORDER — ENSURE SURGERY PO LIQD
237.0000 mL | Freq: Three times a day (TID) | ORAL | Status: DC
  Administered 2023-04-04 – 2023-04-06 (×6): 237 mL via ORAL
  Filled 2023-04-04 (×8): qty 237

## 2023-04-04 NOTE — Care Management Important Message (Signed)
Important Message  Patient Details IM Letter given. Name: [name] MRN: [id number] Date of Birth: May 07, 1941   Medicare Important Message Given:  Yes     Caren Macadam 04/04/2023, 1:36 PM

## 2023-04-04 NOTE — TOC Progression Note (Signed)
Transition of Care [facility]) - Progression Note    Patient Details  Name: [name] MRN: [id number] Date of Birth: 03/18/1941  [facility] of [facility]) CM/SW Contact  Larrie Kass, LCSW Phone Number: 04/04/2023, 12:24 PM  Clinical Narrative:     Insurance auth pending for short term rehab. TOC to follow   Expected Discharge Plan: Skilled Nursing Facility Barriers to Discharge: Continued Medical Work up  Expected Discharge Plan and Services In-house Referral: Clinical Social Work     Living arrangements for the past 2 months: Hotel/Motel                 DME Arranged: Dan Humphreys rolling DME Agency: [employer] Date DME Agency Contacted: 03/29/23 Time DME Agency Contacted: 1056 Representative spoke with at DME Agency: Zack HH Arranged: PT, OT, Nurse's Aide HH Agency: [facility] Date [facility] Agency Contacted: 03/29/23 Time HH Agency Contacted: 1056 Representative spoke with at [facility] Agency: kelly   Social Determinants of Health (SDOH) Interventions SDOH Screenings   Food Insecurity: No Food Insecurity (03/21/2023)  Housing: Low Risk  (03/21/2023)  Transportation Needs: No Transportation Needs (03/21/2023)  Utilities: Not At Risk (03/21/2023)  Depression (PHQ2-9): Low Risk  (05/25/2022)  Financial Resource Strain: Low Risk  (01/19/2022)  Stress: No Stress Concern Present (01/19/2022)  Tobacco Use: Medium Risk (03/26/2023)    Readmission Risk Interventions    03/23/2023    3:27 PM  Readmission Risk Prevention Plan  Transportation Screening Complete  PCP or Specialist Appt within 5-7 Days Complete  Home Care Screening Complete  Medication Review (RN CM) Complete

## 2023-04-04 NOTE — Progress Notes (Signed)
PROGRESS NOTE  Legend Carbonaro  DOB: 1941-03-31  PCP: Henderson Cloud, MD WUJ:[id number]  DOA: 03/20/2023  LOS: 13 days  Hospital Day: 16  Brief narrative: [name] is a 82 y.o. female with PMH significant for HTN, HLD, CHF, paroxysmal A-fib s/p pacemaker, PE 12/2022 on Eliquis anemia, COPD, GERD, arthritis. 8/13, patient presented to the ED with complaint of shortness of breath, wheezing along with dark sticky stools.  In the ED, patient had a hemoglobin of 7.1. CT abdomen showed proximal ascending colon mass, likely malignant. Admitted to [facility] GI and general surgery were consulted 8/16, colonoscopy showed nearly obstructing colon mass 8/19, underwent exploratory ectomy with extended right hemicolectomy and primary anastomosis. 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  Subjective: Patient was seen and examined this morning.  Walking back from bathroom.  Not in distress.  No blood in bowel movement but hemoglobin down today. Son at bedside.  Assessment and plan: Newly diagnosed colon cancer S/p extended right hemicolectomy -8/19 Dr. Corliss Skains Postop ileus resolved.  Currently on soft diet. Pathology showed high-grade invasive adenocarcinoma the colon. CT chest without metastasis Oncology evaluation as an outpatient may be needed.   Acute symptomatic anemia Secondary to blood loss from colon cancer.  Was also on anticoagulation for history of A-fib and recent PE. Hemoglobin was low at 7.1 on admission.  Received 2 units of PRBC transfusion.  EGD unremarkable. 8/23, patient started having bloody bowel movement.  Hemoglobin started to drop as well.  Received 2 units of PRBC transfusion.  Rectal bleeding has stopped.   However hemoglobin down from 8.4 yesterday to 7.7 today.   Continue to monitor clinically.  If hemoglobin tomorrow remains stable, then plan to discharge to SNF.  Eliquis remains on hold. General Surgery follow-up  appreciated Recent Labs    12/08/22 0430 12/09/22 0351 03/21/23 1212 03/22/23 0414 03/31/23 1842 04/01/23 0444 04/02/23 1058 04/03/23 0417 04/04/23 0420  HGB 9.1*   < > 10.8*   < > 6.6* 7.3* 6.8* 8.4* 7.7*  MCV 78.2*   < > 73.3*   < > 74.5* 77.2* 80.4 82.9 83.7  VITAMINB12  --   --  906  --   --   --   --   --   --   FOLATE  --   --  10.2  --   --   --   --   --   --   FERRITIN 8*  --  7*  --   --   --   --   --   --   TIBC 363  --  314  --   --   --   --   --   --   IRON 20*  --  19*  --   --   --   --   --   --    < > = values in this interval not displayed.   Recent PE May 2024, CT angio chest showed few small peripheral segmental right-sided pulm emboli.  Ultrasound duplex lower extremities were negative for DVT.  Prior to that, patient was already on Coumadin but was not consistent compliant.  Following PE, patient was switched from Coumadin to Eliquis.  Eliquis which was held this admission but was resumed again after surgery.  Since patient started having bloody bowel movement, it is currently on hold again. DVT prophylaxis resumed this morning.  If hemoglobin stable, plan to resume Eliquis  tomorrow.  Paroxysmal A-fib Tachybradycardia syndrome s/p PPM Anticoagulation plan as above   Chronic diastolic heart failure  bilateral lower extremity edema Euvolemic.   Most recent echo from May 2024 with EF 55 to 60%, grade 1 diastolic dysfunction Blood pressure running low normal.   GERD on Protonix   Obesity  Body mass index is 30.51 kg/m. Patient has been advised to make an attempt to improve diet and exercise patterns to aid in weight loss.    Mobility: Encourage ambulation.  Goals of care   Code Status: Full Code     DVT prophylaxis:  enoxaparin (LOVENOX) injection 40 mg Start: 04/04/23 1200 SCD's Start: 03/26/23 2159 SCDs Start: 03/21/23 0911   Antimicrobials: None currently Fluid: Not on IV fluid Consultants: General surgery Family Communication: Son at  bedside today  Status: Inpatient  Level of care:  Med-Surg   Patient is from: Home Needs to continue in-hospital care: BRBPR has stopped.  Hemoglobin slightly down.  Need to be monitored further Anticipated d/c to: If hemoglobin stable, plan to discharge tomorrow.   Diet:  Diet Order             DIET SOFT Fluid consistency: Thin  Diet effective now                   Scheduled Meds:  acetaminophen  1,000 mg Oral Q6H   atorvastatin  20 mg Oral Daily   docusate sodium  100 mg Oral BID   enoxaparin (LOVENOX) injection  40 mg Subcutaneous Q24H   feeding supplement  237 mL Oral TID BM   mometasone-formoterol  2 puff Inhalation BID   montelukast  10 mg Oral QHS   pantoprazole (PROTONIX) IV  40 mg Intravenous Q12H   sodium chloride flush  3 mL Intravenous Q12H    PRN meds: acetaminophen **OR** acetaminophen, bisacodyl, diphenhydrAMINE **OR** diphenhydrAMINE, hydrALAZINE, HYDROcodone-acetaminophen, ipratropium, levalbuterol, morphine injection, ondansetron **OR** ondansetron (ZOFRAN) IV, senna-docusate, simethicone, traZODone   Infusions:     Antimicrobials: Anti-infectives (From admission, onward)    Start     Dose/Rate Route Frequency Ordered Stop   03/26/23 2245  cefoTEtan (CEFOTAN) 2 g in sodium chloride 0.9 % 100 mL IVPB        2 g 200 mL/hr over 30 Minutes Intravenous Every 12 hours 03/26/23 2158 03/27/23 0056   03/26/23 0900  cefoTEtan (CEFOTAN) 2 g in sodium chloride 0.9 % 100 mL IVPB        2 g 200 mL/hr over 30 Minutes Intravenous  Once 03/25/23 1009 03/26/23 1038       Nutritional status:  Body mass index is 30.51 kg/m.          Objective: Vitals:   04/03/23 2013 04/04/23 0617  BP: 112/60 110/60  Pulse: 75 88  Resp: 17 18  Temp: 98.5 F (36.9 C) 98.2 F (36.8 C)  SpO2: 100% 100%    Intake/Output Summary (Last 24 hours) at 04/04/2023 1008 Last data filed at 04/03/2023 2113 Gross per 24 hour  Intake 3 ml  Output --  Net 3 ml   Filed  Weights   03/20/23 2142  Weight: 85.7 kg   Weight change:  Body mass index is 30.51 kg/m.   Physical Exam: General exam: Pleasant elderly African-American female.  Not in distress Skin: No rashes, lesions or ulcers. HEENT: Atraumatic, normocephalic, no obvious bleeding Lungs: Clear to auscultation bilaterally CVS: Regular rate and rhythm, no murmur GI/Abd soft, mild appropriate postsurgical tenderness, nondistended, bowel sound present CNS:  Alert, awake, oriented x 3 Psychiatry: Mood appropriate Extremities: 1+ bilateral pedal edema, no calf tenderness.  Data Review: I have personally reviewed the laboratory data and studies available.  F/u labs ordered Unresulted Labs (From admission, onward)     Start     Ordered   04/03/23 0500  Basic metabolic panel  Daily,   R     Question:  Specimen collection method  Answer:  Lab=Lab collect   04/02/23 0851   04/03/23 0500  CBC with Differential/Platelet  Daily,   R     Question:  Specimen collection method  Answer:  Lab=Lab collect   04/02/23 0851            Total time spent in review of labs and imaging, patient evaluation, formulation of plan, documentation and communication with family: 45 minutes  Signed, Lorin Glass, MD [facility] 04/04/2023

## 2023-04-04 NOTE — Progress Notes (Signed)
Mobility Specialist - Progress Note   04/04/23 1421  Mobility  Activity Ambulated with assistance to bathroom;Ambulated with assistance in hallway  Level of Assistance Minimal assist, patient does 75% or more  Assistive Device Front wheel walker  Distance Ambulated (ft) 50 ft  Range of Motion/Exercises Active Assistive  Activity Response Tolerated fair  Mobility Referral Yes  $Mobility charge 1 Mobility  Mobility Specialist Start Time (ACUTE ONLY) 1400  Mobility Specialist Stop Time (ACUTE ONLY) 1421  Mobility Specialist Time Calculation (min) (ACUTE ONLY) 21 min   Pt was found in bed and agreeable to ambulate. Min-A for bed mobility and STS. Requested to use bathroom prior to going in hallway. Grew fatigued with session. At EOS returned to bed with all needs met. Call bell in reach.  Billey Chang Mobility Specialist

## 2023-04-04 NOTE — Progress Notes (Signed)
Mobility Specialist - Progress Note   04/04/23 1011  Mobility  Activity Ambulated with assistance to bathroom  Level of Assistance Minimal assist, patient does 75% or more  Assistive Device Front wheel walker  Distance Ambulated (ft) 16 ft  Range of Motion/Exercises Active Assistive  Activity Response Tolerated fair  Mobility Referral Yes  $Mobility charge 1 Mobility  Mobility Specialist Start Time (ACUTE ONLY) 0945  Mobility Specialist Stop Time (ACUTE ONLY) 1011  Mobility Specialist Time Calculation (min) (ACUTE ONLY) 26 min   Pt was found in bed and agreeable to try ambulation. Was min-A for bed mobility and to go from STS. Once sitting EOB stated needing to use bathroom. After bathroom use stated being too fatigued to try any further ambulation. At EOS returned to bed with all needs met and son in room.Will try to ambulate more later on if schedule permits.  Billey Chang Mobility Specialist

## 2023-04-04 NOTE — Progress Notes (Signed)
Progress Note  9 Days Post-Op  Subjective: Pt denies abdominal pain, nausea or vomiting. No BM recorded overnight and nursing staff and son don't think that she has had one. Appetite improving some. Her son, from [place], is at the bedside.   Objective: Vital signs in last 24 hours: Temp:  [98 F (36.7 C)-98.5 F (36.9 C)] 98.2 F (36.8 C) (08/28 0617) Pulse Rate:  [70-88] 88 (08/28 0617) Resp:  [17-18] 18 (08/28 0617) BP: (101-112)/(53-60) 110/60 (08/28 0617) SpO2:  [100 %] 100 % (08/28 0617) Last BM Date : 04/02/23  Intake/Output from previous day: 08/27 0701 - 08/28 0700 In: 3 [I.V.:3] Out: -  Intake/Output this shift: No intake/output data recorded.  PE: General: pleasant, WD, elderly female who is sitting up eating breakfast Lungs:  Respiratory effort nonlabored Abd: soft, NT, ND   Lab Results:  Recent Labs    04/03/23 0417 04/04/23 0420  WBC 7.7 9.0  HGB 8.4* 7.7*  HCT 27.2* 25.2*  PLT 219 279   BMET Recent Labs    04/03/23 0417 04/04/23 0420  NA 140 143  K 3.3* 3.5  CL 112* 116*  CO2 19* 20*  GLUCOSE 96 116*  BUN 21 19  CREATININE 0.89 0.84  CALCIUM 7.9* 8.0*   PT/INR No results for input(s): "LABPROT", "INR" in the last 72 hours. CMP     Component Value Date/Time   NA 143 04/04/2023 0420   NA 143 04/06/2020 1327   K 3.5 04/04/2023 0420   CL 116 (H) 04/04/2023 0420   CO2 20 (L) 04/04/2023 0420   GLUCOSE 116 (H) 04/04/2023 0420   BUN 19 04/04/2023 0420   BUN 15 04/06/2020 1327   CREATININE 0.84 04/04/2023 0420   CREATININE 1.08 05/05/2011 1536   CALCIUM 8.0 (L) 04/04/2023 0420   PROT 4.6 (L) 03/24/2023 0422   ALBUMIN 2.2 (L) 03/24/2023 0422   AST 28 03/24/2023 0422   ALT 16 03/24/2023 0422   ALKPHOS 86 03/24/2023 0422   BILITOT 1.1 03/24/2023 0422   GFRNONAA >60 04/04/2023 0420   GFRNONAA 50 (L) 05/05/2011 1536   GFRAA 60 04/06/2020 1327   GFRAA 60 (L) 05/05/2011 1536   Lipase     Component Value Date/Time   LIPASE 29  11/22/2020 2212       Studies/Results: No results found.  Anti-infectives: Anti-infectives (From admission, onward)    Start     Dose/Rate Route Frequency Ordered Stop   03/26/23 2245  cefoTEtan (CEFOTAN) 2 g in sodium chloride 0.9 % 100 mL IVPB        2 g 200 mL/hr over 30 Minutes Intravenous Every 12 hours 03/26/23 2158 03/27/23 0056   03/26/23 0900  cefoTEtan (CEFOTAN) 2 g in sodium chloride 0.9 % 100 mL IVPB        2 g 200 mL/hr over 30 Minutes Intravenous  Once 03/25/23 1009 03/26/23 1038        Assessment/Plan  POD#9 s/p Exploratory laparotomy, lysis of adhesions, right hemicolectomy with primary anastomosis by Dr. Corliss Skains for Ascending colon cancer by Dr. 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  - anticoagulation held due to blood in stool. S/p 1 PRBC 8/24. Hgb drifted to 6.8 8/26 and she got another 1 u pRBC >> hgb 8.4 yesterday and 7.7 today. No  further bloody BMs. Ok for [facility] or lovenox for DVT ppx today, would continue to hold Eliquis for now. Monitor CBC and bowel movements. - Mobilize, PT recommend SNF initially and then [facility] PT. Patient's family concerned about going home and would like to consider SNF if possible. PT feels she is appropriate for SNF. TOC consult placed for assistance pursuing SNF options and appears they are looking at [facility].   FEN - soft diet. ensure VTE - SCDs, eliquis on hold ID - Cefotetan peri-op, none currently.  Foley - Out POD 1, voiding   A. fib Arthritis Asthma COPD HTN HLD pacemaker secondary to tachy-brady syndrome Chronic diastolic HF with LF of 55-60% PE in 2024 on Eliquis Anemia Obesity GERD   LOS: 13 days   I reviewed hospitalist notes, last 24 h vitals and pain scores, last 48 h  intake and output, and last 24 h labs and trends.   Juliet Rude, [facility] 04/04/2023, 9:31 AM Please see Amion for pager number during day hours 7:00am-4:30pm

## 2023-04-05 DIAGNOSIS — D649 Anemia, unspecified: Secondary | ICD-10-CM | POA: Diagnosis not present

## 2023-04-05 LAB — CBC WITH DIFFERENTIAL/PLATELET
Abs Immature Granulocytes: 0.13 10*3/uL — ABNORMAL HIGH (ref 0.00–0.07)
Basophils Absolute: 0 10*3/uL (ref 0.0–0.1)
Basophils Relative: 0 %
Eosinophils Absolute: 0.4 10*3/uL (ref 0.0–0.5)
Eosinophils Relative: 5 %
HCT: 26.3 % — ABNORMAL LOW (ref 36.0–46.0)
Hemoglobin: 7.9 g/dL — ABNORMAL LOW (ref 12.0–15.0)
Immature Granulocytes: 1 %
Lymphocytes Relative: 17 %
Lymphs Abs: 1.6 10*3/uL (ref 0.7–4.0)
MCH: 25.6 pg — ABNORMAL LOW (ref 26.0–34.0)
MCHC: 30 g/dL (ref 30.0–36.0)
MCV: 85.1 fL (ref 80.0–100.0)
Monocytes Absolute: 0.9 10*3/uL (ref 0.1–1.0)
Monocytes Relative: 9 %
Neutro Abs: 6.3 10*3/uL (ref 1.7–7.7)
Neutrophils Relative %: 68 %
Platelets: 320 10*3/uL (ref 150–400)
RBC: 3.09 MIL/uL — ABNORMAL LOW (ref 3.87–5.11)
RDW: 32.9 % — ABNORMAL HIGH (ref 11.5–15.5)
WBC: 9.3 10*3/uL (ref 4.0–10.5)
nRBC: 0.2 % (ref 0.0–0.2)

## 2023-04-05 LAB — BASIC METABOLIC PANEL
Anion gap: 6 (ref 5–15)
BUN: 16 mg/dL (ref 8–23)
CO2: 21 mmol/L — ABNORMAL LOW (ref 22–32)
Calcium: 8 mg/dL — ABNORMAL LOW (ref 8.9–10.3)
Chloride: 115 mmol/L — ABNORMAL HIGH (ref 98–111)
Creatinine, Ser: 0.91 mg/dL (ref 0.44–1.00)
GFR, Estimated: >60 mL/min (ref >60)
Glucose, Bld: 87 mg/dL (ref 70–99)
Potassium: 3.4 mmol/L — ABNORMAL LOW (ref 3.5–5.1)
Sodium: 142 mmol/L (ref 135–145)

## 2023-04-05 MED ORDER — APIXABAN 5 MG PO TABS
5.0000 mg | ORAL_TABLET | Freq: Two times a day (BID) | ORAL | Status: DC
  Administered 2023-04-05 – 2023-04-06 (×3): 5 mg via ORAL
  Filled 2023-04-05 (×3): qty 1

## 2023-04-05 MED ORDER — PANTOPRAZOLE SODIUM 40 MG PO TBEC
40.0000 mg | DELAYED_RELEASE_TABLET | Freq: Two times a day (BID) | ORAL | Status: DC
  Administered 2023-04-05 – 2023-04-06 (×2): 40 mg via ORAL
  Filled 2023-04-05 (×2): qty 1

## 2023-04-05 NOTE — Plan of Care (Signed)

## 2023-04-05 NOTE — TOC Progression Note (Signed)
Transition of Care [facility]) - Progression Note    Patient Details  Name: Lorene Warsaw MRN: [id number] Date of Birth: [birth year]  Transition of Care [facility]) CM/SW Contact  Larrie Kass, LCSW Phone Number: 04/05/2023, 11:53 AM  Clinical Narrative:    Insurance Berkley Harvey is still pending. TOC to follow.    Expected Discharge Plan: Skilled Nursing Facility   Expected Discharge Plan and Services In-house Referral: Clinical Social Work     Living arrangements for the past 2 months: Hotel/Motel                 DME Arranged: Dan Humphreys rolling DME Agency: [employer] Date DME Agency Contacted: 03/29/23 Time DME Agency Contacted: 1056 Representative spoke with at DME Agency: Zack HH Arranged: PT, OT, Nurse's Aide HH Agency: [facility] Date [facility] Agency Contacted: 03/29/23 Time HH Agency Contacted: 1056 Representative spoke with at [facility] Agency: kelly   Social Determinants of Health (SDOH) Interventions SDOH Screenings   Food Insecurity: No Food Insecurity (03/21/2023)  Housing: Low Risk  (03/21/2023)  Transportation Needs: No Transportation Needs (03/21/2023)  Utilities: Not At Risk (03/21/2023)  Depression (PHQ2-9): Low Risk  (05/25/2022)  Financial Resource Strain: Low Risk  (01/19/2022)  Stress: No Stress Concern Present (01/19/2022)  Tobacco Use: Medium Risk (03/26/2023)    Readmission Risk Interventions    03/23/2023    3:27 PM  Readmission Risk Prevention Plan  Transportation Screening Complete  PCP or Specialist Appt within 5-7 Days Complete  Home Care Screening Complete  Medication Review (RN CM) Complete

## 2023-04-05 NOTE — Progress Notes (Signed)
Physical Therapy Treatment Patient Details Name: [name] MRN: [id number] DOB: Feb 16, 1941 Today's Date: 04/05/2023   History of Present Illness 82 yr old female admitted with shortness of breath and wheezing. She is s/p an exploratory laparotomy, and R hemicolectomy on 03/26/23, due to an umbilical hernia and colon mass. Hx of obesity, Afib, emphysema, CHF, pacemaker, colon mass, PE, anemia    PT Comments  Pt requesting to use bathroom.  Pt assisted with donning abdominal binder and ambulating to/from bathroom.  Pt too fatigued to ambulate into hallway today.  Current d/c plan remains appropriate.  Patient will benefit from continued inpatient follow up therapy, <3 hours/day.     If plan is discharge home, recommend the following: A little help with walking and/or transfers;A little help with bathing/dressing/bathroom;Assistance with cooking/housework;Assist for transportation;Help with stairs or ramp for entrance   Can travel by private vehicle     No  Equipment Recommendations  Rolling walker (2 wheels)    Recommendations for Other Services       Precautions / Restrictions Precautions Precautions: Fall Precaution Comments: abd surgery Other Brace: abd binder Restrictions Weight Bearing Restrictions: No     Mobility  Bed Mobility Overal bed mobility: Needs Assistance Bed Mobility: Supine to Sit     Supine to sit: Min assist, Used rails, HOB elevated, Mod assist     General bed mobility comments: assist for trunk upright and scooting to EOB, utilized bed pad    Transfers Overall transfer level: Needs assistance Equipment used: Rolling walker (2 wheels) Transfers: Sit to/from Stand Sit to Stand: Min assist           General transfer comment: verbal cues for hand placement, pt uses countdown and requires min assist to rise    Ambulation/Gait Ambulation/Gait assistance: Editor, commissioning (Feet): 18 Feet Assistive device: Rolling walker (2  wheels) Gait Pattern/deviations: Step-through pattern, Decreased stride length       General Gait Details: approx 18 ft total into bathroom and then to recliner, pt felt too fatigued to ambulate in hallway   Stairs             Wheelchair Mobility     Tilt Bed    Modified Rankin (Stroke Patients Only)       Balance                                            Cognition Arousal: Alert Behavior During Therapy: WFL for tasks assessed/performed Overall Cognitive Status: Within Functional Limits for tasks assessed                                          Exercises      General Comments        Pertinent Vitals/Pain Pain Assessment Pain Assessment: Faces Faces Pain Scale: Hurts a little bit Pain Location: abdomen Pain Descriptors / Indicators: Discomfort, Sore Pain Intervention(s): Monitored during session, Repositioned    Home Living                          Prior Function            PT Goals (current goals can now be found in the care plan section) Progress towards PT goals:  Progressing toward goals    Frequency    Min 1X/week      PT Plan      Co-evaluation              AM-PAC PT "6 Clicks" Mobility   Outcome Measure  Help needed turning from your back to your side while in a flat bed without using bedrails?: A Little Help needed moving from lying on your back to sitting on the side of a flat bed without using bedrails?: A Little Help needed moving to and from a bed to a chair (including a wheelchair)?: A Little Help needed standing up from a chair using your arms (e.g., wheelchair or bedside chair)?: A Little Help needed to walk in hospital room?: A Lot Help needed climbing 3-5 steps with a railing? : A Lot 6 Click Score: 16    End of Session Equipment Utilized During Treatment: Gait belt Activity Tolerance: Patient limited by fatigue Patient left: in chair;with call bell/phone within  reach;with chair alarm set;with family/visitor present Nurse Communication: Mobility status PT Visit Diagnosis: Muscle weakness (generalized) (M62.81);Difficulty in walking, not elsewhere classified (R26.2)     Time: 1130-1150 PT Time Calculation (min) (ACUTE ONLY): 20 min  Charges:    $Gait Training: 8-22 mins PT General Charges $$ ACUTE PT VISIT: 1 Visit                     Paulino Door, DPT Physical Therapist Acute Rehabilitation Services Office: [phone]    [name] 04/05/2023, 12:41 PM

## 2023-04-05 NOTE — Progress Notes (Signed)
OT Cancellation Note  Patient Details Name: [name] MRN: [id number] DOB: [birth year]   Cancelled Treatment:    Reason Eval/Treat Not Completed: Fatigue/lethargy limiting ability to participate. Pt deferred due to fatigue. Asked therapy to check back later.    Reuben Likes, OTR/L 04/05/2023, 5:23 PM

## 2023-04-05 NOTE — Progress Notes (Signed)
Progress Note  10 Days Post-Op  Subjective: Pt continues to feel better - she reports increased PO intake. She denies blood in her stool. Hgb stable today 7.9 from 7,7.  Objective: Vital signs in last 24 hours: Temp:  [97.6 F (36.4 C)-98.2 F (36.8 C)] 97.6 F (36.4 C) (08/29 0514) Pulse Rate:  [71-98] 71 (08/29 0514) Resp:  [18-20] 18 (08/29 0514) BP: (101-117)/(49-65) 105/59 (08/29 0514) SpO2:  [99 %-100 %] 99 % (08/29 0806) Last BM Date : 04/04/23  Intake/Output from previous day: No intake/output data recorded. Intake/Output this shift: Total I/O In: 240 [P.O.:240] Out: -   PE: General: pleasant, WD, elderly female who is laying in bed in NAD Heart: regular, rate, and rhythm.   Lungs:  Respiratory effort nonlabored Abd: soft, NT, ND, incision C/D/I with staples present    Lab Results:  Recent Labs    04/04/23 0420 04/05/23 0419  WBC 9.0 9.3  HGB 7.7* 7.9*  HCT 25.2* 26.3*  PLT 279 320   BMET Recent Labs    04/04/23 0420 04/05/23 0419  NA 143 142  K 3.5 3.4*  CL 116* 115*  CO2 20* 21*  GLUCOSE 116* 87  BUN 19 16  CREATININE 0.84 0.91  CALCIUM 8.0* 8.0*   PT/INR No results for input(s): "LABPROT", "INR" in the last 72 hours. CMP     Component Value Date/Time   NA 142 04/05/2023 0419   NA 143 04/06/2020 1327   K 3.4 (L) 04/05/2023 0419   CL 115 (H) 04/05/2023 0419   CO2 21 (L) 04/05/2023 0419   GLUCOSE 87 04/05/2023 0419   BUN 16 04/05/2023 0419   BUN 15 04/06/2020 1327   CREATININE 0.91 04/05/2023 0419   CREATININE 1.08 05/05/2011 1536   CALCIUM 8.0 (L) 04/05/2023 0419   PROT 4.6 (L) 03/24/2023 0422   ALBUMIN 2.2 (L) 03/24/2023 0422   AST 28 03/24/2023 0422   ALT 16 03/24/2023 0422   ALKPHOS 86 03/24/2023 0422   BILITOT 1.1 03/24/2023 0422   GFRNONAA >60 04/05/2023 0419   GFRNONAA 50 (L) 05/05/2011 1536   GFRAA 60 04/06/2020 1327   GFRAA 60 (L) 05/05/2011 1536   Lipase     Component Value Date/Time   LIPASE 29 11/22/2020  2212       Studies/Results: No results found.  Anti-infectives: Anti-infectives (From admission, onward)    Start     Dose/Rate Route Frequency Ordered Stop   03/26/23 2245  cefoTEtan (CEFOTAN) 2 g in sodium chloride 0.9 % 100 mL IVPB        2 g 200 mL/hr over 30 Minutes Intravenous Every 12 hours 03/26/23 2158 03/27/23 0056   03/26/23 0900  cefoTEtan (CEFOTAN) 2 g in sodium chloride 0.9 % 100 mL IVPB        2 g 200 mL/hr over 30 Minutes Intravenous  Once 03/25/23 1009 03/26/23 1038        Assessment/Plan  POD#8 s/p Exploratory laparotomy, lysis of adhesions, right hemicolectomy with primary anastomosis by Dr. Corliss Skains for Ascending colon cancer by Dr. 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  - anticoagulation held due to blood in stool. S/p 1 PRBC 8/24 and 1 u PRBC 8/26.no further blood in her stool. Hgb stable on DVT ppx. Ok to resume DOAC today. -  Mobilize, PT recommend SNF initially and then [facility] PT. Patient's family concerned about going home and would like to consider SNF if possible. PT feels she is appropriate for SNF. TOC consult placed for assistance pursuing SNF options and appears they are looking at [facility].   FEN - soft diet. Boost. IVF per primary  VTE - SCDs, eliquis ID - Cefotetan peri-op, none currently.  Foley - Out POD 1, voiding   A. fib Arthritis Asthma COPD HTN HLD pacemaker secondary to tachy-brady syndrome Chronic diastolic HF with LF of 55-60% PE in 2024 on Eliquis Anemia Obesity GERD   LOS: 14 days   I reviewed hospitalist notes, last 24 h vitals and pain scores, last 48 h intake and output, and last 24 h labs and trends.   Adam Phenix, [facility] 04/05/2023, 9:53 AM Please see  Amion for pager number during day hours 7:00am-4:30pm

## 2023-04-05 NOTE — Progress Notes (Signed)
PROGRESS NOTE  [name]  DOB: 12-30-1940  PCP: Henderson Cloud, MD ZOX:[id number]  DOA: 03/20/2023  LOS: 14 days  Hospital Day: 17  Brief narrative: [name] is a 82 y.o. female with PMH significant for HTN, HLD, CHF, paroxysmal A-fib s/p pacemaker, PE 12/2022 on Eliquis anemia, COPD, GERD, arthritis. 8/13, patient presented to the ED with complaint of shortness of breath, wheezing along with dark sticky stools.  In the ED, patient had a hemoglobin of 7.1. CT abdomen showed proximal ascending colon mass, likely malignant. Admitted to [facility] GI and general surgery were consulted 8/16, colonoscopy showed nearly obstructing colon mass 8/19, underwent exploratory ectomy with extended right hemicolectomy and primary anastomosis. 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  Subjective: Patient was seen and examined this morning.   Was in the bathroom.  Not in distress.  No further blood in bowel movements.  Son at bedside.  Assessment and plan: Newly diagnosed colon cancer S/p extended right hemicolectomy -8/19 Dr. Corliss Skains Postop ileus resolved.  Currently on soft diet. Pathology showed high-grade invasive adenocarcinoma the colon. CT chest without metastasis Oncology evaluation as an outpatient may be needed.   Acute symptomatic anemia Secondary to blood loss from colon cancer.  Was also on anticoagulation for history of A-fib and recent PE. Hemoglobin was low at 7.1 on admission.  Received 2 units of PRBC transfusion.  EGD unremarkable. 8/23, patient started having bloody bowel movement.  Hemoglobin started to drop as well.  Received 2 units of PRBC transfusion.  Rectal bleeding has stopped.   Hemoglobin stable in last 24 hours. Per general surgery recommendation, Eliquis resumed today.  If hemoglobin stable tomorrow, plan to discharge home tomorrow. Recent Labs    12/08/22 0430 12/09/22 0351 03/21/23 1212 03/22/23 0414  04/01/23 0444 04/02/23 1058 04/03/23 0417 04/04/23 0420 04/05/23 0419  HGB 9.1*   < > 10.8*   < > 7.3* 6.8* 8.4* 7.7* 7.9*  MCV 78.2*   < > 73.3*   < > 77.2* 80.4 82.9 83.7 85.1  VITAMINB12  --   --  906  --   --   --   --   --   --   FOLATE  --   --  10.2  --   --   --   --   --   --   FERRITIN 8*  --  7*  --   --   --   --   --   --   TIBC 363  --  314  --   --   --   --   --   --   IRON 20*  --  19*  --   --   --   --   --   --    < > = values in this interval not displayed.   Recent PE May 2024, CT angio chest showed few small peripheral segmental right-sided pulm emboli.  Ultrasound duplex lower extremities were negative for DVT.  Prior to that, patient was already on Coumadin but was not consistent compliant.  Following PE, patient was switched from Coumadin to Eliquis.  Eliquis which was held this admission  Per general surgery recommendation, Eliquis resumed today.   Paroxysmal A-fib Tachybradycardia syndrome s/p PPM Anticoagulation plan as above   Chronic diastolic heart failure  bilateral lower extremity edema Euvolemic.   Most recent echo from May 2024 with EF 55 to 60%, grade 1  diastolic dysfunction Blood pressure running low normal.   GERD on Protonix   Obesity  Body mass index is 30.51 kg/m. Patient has been advised to make an attempt to improve diet and exercise patterns to aid in weight loss.  Mobility: Encourage ambulation.  Goals of care   Code Status: Full Code     DVT prophylaxis:  SCD's Start: 03/26/23 2159 SCDs Start: 03/21/23 0911 apixaban (ELIQUIS) tablet 5 mg   Antimicrobials: None currently Fluid: Not on IV fluid Consultants: General surgery Family Communication: Son at bedside today  Status: Inpatient  Level of care:  Med-Surg   Patient is from: Home Needs to continue in-hospital care: BRBPR has stopped.  Hemoglobin stable. Anticipated d/c to: If hemoglobin stable, plan to discharge tomorrow.  Insurance authorization pending for  SNF   Diet:  Diet Order             DIET SOFT Fluid consistency: Thin  Diet effective now                   Scheduled Meds:  acetaminophen  1,000 mg Oral Q6H   apixaban  5 mg Oral BID   atorvastatin  20 mg Oral Daily   docusate sodium  100 mg Oral BID   feeding supplement  237 mL Oral TID BM   mometasone-formoterol  2 puff Inhalation BID   montelukast  10 mg Oral QHS   pantoprazole  40 mg Oral BID   sodium chloride flush  3 mL Intravenous Q12H    PRN meds: acetaminophen **OR** acetaminophen, bisacodyl, diphenhydrAMINE **OR** diphenhydrAMINE, hydrALAZINE, HYDROcodone-acetaminophen, ipratropium, levalbuterol, morphine injection, ondansetron **OR** ondansetron (ZOFRAN) IV, senna-docusate, simethicone, traZODone   Infusions:     Antimicrobials: Anti-infectives (From admission, onward)    Start     Dose/Rate Route Frequency Ordered Stop   03/26/23 2245  cefoTEtan (CEFOTAN) 2 g in sodium chloride 0.9 % 100 mL IVPB        2 g 200 mL/hr over 30 Minutes Intravenous Every 12 hours 03/26/23 2158 03/27/23 0056   03/26/23 0900  cefoTEtan (CEFOTAN) 2 g in sodium chloride 0.9 % 100 mL IVPB        2 g 200 mL/hr over 30 Minutes Intravenous  Once 03/25/23 1009 03/26/23 1038       Nutritional status:  Body mass index is 30.51 kg/m.          Objective: Vitals:   04/05/23 0806 04/05/23 1315  BP:  (!) 114/52  Pulse:  78  Resp:  16  Temp:  98.1 F (36.7 C)  SpO2: 99% 100%    Intake/Output Summary (Last 24 hours) at 04/05/2023 1405 Last data filed at 04/05/2023 0900 Gross per 24 hour  Intake 240 ml  Output --  Net 240 ml   Filed Weights   03/20/23 2142  Weight: 85.7 kg   Weight change:  Body mass index is 30.51 kg/m.   Physical Exam: General exam: Pleasant elderly African-American female.  Not in distress Skin: No rashes, lesions or ulcers. HEENT: Atraumatic, normocephalic, no obvious bleeding Lungs: Clear to auscultation bilaterally CVS: Regular rate  and rhythm, no murmur GI/Abd soft, mild appropriate postsurgical tenderness, nondistended, bowel sound present CNS: Alert, awake, oriented x 3 Psychiatry: Mood appropriate Extremities: 1+ bilateral pedal edema, no calf tenderness.  Data Review: I have personally reviewed the laboratory data and studies available.  F/u labs ordered Unresulted Labs (From admission, onward)    None       Total time spent  in review of labs and imaging, patient evaluation, formulation of plan, documentation and communication with family: 45 minutes  Signed, Lorin Glass, MD [facility] 04/05/2023

## 2023-04-06 DIAGNOSIS — I495 Sick sinus syndrome: Secondary | ICD-10-CM | POA: Diagnosis not present

## 2023-04-06 DIAGNOSIS — R41841 Cognitive communication deficit: Secondary | ICD-10-CM | POA: Diagnosis not present

## 2023-04-06 DIAGNOSIS — C182 Malignant neoplasm of ascending colon: Secondary | ICD-10-CM | POA: Diagnosis not present

## 2023-04-06 DIAGNOSIS — E669 Obesity, unspecified: Secondary | ICD-10-CM | POA: Diagnosis not present

## 2023-04-06 DIAGNOSIS — I5032 Chronic diastolic (congestive) heart failure: Secondary | ICD-10-CM | POA: Diagnosis not present

## 2023-04-06 DIAGNOSIS — E7800 Pure hypercholesterolemia, unspecified: Secondary | ICD-10-CM | POA: Diagnosis not present

## 2023-04-06 DIAGNOSIS — J45909 Unspecified asthma, uncomplicated: Secondary | ICD-10-CM | POA: Diagnosis not present

## 2023-04-06 DIAGNOSIS — E785 Hyperlipidemia, unspecified: Secondary | ICD-10-CM | POA: Diagnosis not present

## 2023-04-06 DIAGNOSIS — Z7401 Bed confinement status: Secondary | ICD-10-CM | POA: Diagnosis not present

## 2023-04-06 DIAGNOSIS — Z483 Aftercare following surgery for neoplasm: Secondary | ICD-10-CM | POA: Diagnosis not present

## 2023-04-06 DIAGNOSIS — Z950 Presence of cardiac pacemaker: Secondary | ICD-10-CM | POA: Diagnosis not present

## 2023-04-06 DIAGNOSIS — I509 Heart failure, unspecified: Secondary | ICD-10-CM | POA: Diagnosis not present

## 2023-04-06 DIAGNOSIS — R600 Localized edema: Secondary | ICD-10-CM | POA: Diagnosis not present

## 2023-04-06 DIAGNOSIS — C189 Malignant neoplasm of colon, unspecified: Secondary | ICD-10-CM | POA: Diagnosis not present

## 2023-04-06 DIAGNOSIS — Z9049 Acquired absence of other specified parts of digestive tract: Secondary | ICD-10-CM | POA: Diagnosis not present

## 2023-04-06 DIAGNOSIS — I110 Hypertensive heart disease with heart failure: Secondary | ICD-10-CM | POA: Diagnosis not present

## 2023-04-06 DIAGNOSIS — I480 Paroxysmal atrial fibrillation: Secondary | ICD-10-CM | POA: Diagnosis not present

## 2023-04-06 DIAGNOSIS — K219 Gastro-esophageal reflux disease without esophagitis: Secondary | ICD-10-CM | POA: Diagnosis not present

## 2023-04-06 DIAGNOSIS — J4540 Moderate persistent asthma, uncomplicated: Secondary | ICD-10-CM | POA: Diagnosis not present

## 2023-04-06 DIAGNOSIS — Z86711 Personal history of pulmonary embolism: Secondary | ICD-10-CM | POA: Diagnosis not present

## 2023-04-06 DIAGNOSIS — R1310 Dysphagia, unspecified: Secondary | ICD-10-CM | POA: Diagnosis not present

## 2023-04-06 DIAGNOSIS — R0602 Shortness of breath: Secondary | ICD-10-CM | POA: Diagnosis not present

## 2023-04-06 DIAGNOSIS — Z87891 Personal history of nicotine dependence: Secondary | ICD-10-CM | POA: Diagnosis not present

## 2023-04-06 DIAGNOSIS — D649 Anemia, unspecified: Secondary | ICD-10-CM | POA: Diagnosis not present

## 2023-04-06 DIAGNOSIS — R2681 Unsteadiness on feet: Secondary | ICD-10-CM | POA: Diagnosis not present

## 2023-04-06 DIAGNOSIS — Z8639 Personal history of other endocrine, nutritional and metabolic disease: Secondary | ICD-10-CM | POA: Diagnosis not present

## 2023-04-06 DIAGNOSIS — M6281 Muscle weakness (generalized): Secondary | ICD-10-CM | POA: Diagnosis not present

## 2023-04-06 DIAGNOSIS — I4891 Unspecified atrial fibrillation: Secondary | ICD-10-CM | POA: Diagnosis not present

## 2023-04-06 DIAGNOSIS — J439 Emphysema, unspecified: Secondary | ICD-10-CM | POA: Diagnosis not present

## 2023-04-06 DIAGNOSIS — R2689 Other abnormalities of gait and mobility: Secondary | ICD-10-CM | POA: Diagnosis not present

## 2023-04-06 DIAGNOSIS — D62 Acute posthemorrhagic anemia: Secondary | ICD-10-CM | POA: Diagnosis not present

## 2023-04-06 DIAGNOSIS — R531 Weakness: Secondary | ICD-10-CM | POA: Diagnosis not present

## 2023-04-06 DIAGNOSIS — Z7901 Long term (current) use of anticoagulants: Secondary | ICD-10-CM | POA: Diagnosis not present

## 2023-04-06 DIAGNOSIS — Z7409 Other reduced mobility: Secondary | ICD-10-CM | POA: Diagnosis not present

## 2023-04-06 DIAGNOSIS — E876 Hypokalemia: Secondary | ICD-10-CM | POA: Diagnosis not present

## 2023-04-06 LAB — CBC WITH DIFFERENTIAL/PLATELET
Abs Immature Granulocytes: 0.1 10*3/uL — ABNORMAL HIGH (ref 0.00–0.07)
Basophils Absolute: 0.1 10*3/uL (ref 0.0–0.1)
Basophils Relative: 1 %
Eosinophils Absolute: 0.4 10*3/uL (ref 0.0–0.5)
Eosinophils Relative: 4 %
HCT: 27.8 % — ABNORMAL LOW (ref 36.0–46.0)
Hemoglobin: 8.6 g/dL — ABNORMAL LOW (ref 12.0–15.0)
Immature Granulocytes: 1 %
Lymphocytes Relative: 16 %
Lymphs Abs: 1.7 10*3/uL (ref 0.7–4.0)
MCH: 26.7 pg (ref 26.0–34.0)
MCHC: 30.9 g/dL (ref 30.0–36.0)
MCV: 86.3 fL (ref 80.0–100.0)
Monocytes Absolute: 0.8 10*3/uL (ref 0.1–1.0)
Monocytes Relative: 8 %
Neutro Abs: 7.6 10*3/uL (ref 1.7–7.7)
Neutrophils Relative %: 70 %
Platelets: 363 10*3/uL (ref 150–400)
RBC: 3.22 MIL/uL — ABNORMAL LOW (ref 3.87–5.11)
RDW: 33.5 % — ABNORMAL HIGH (ref 11.5–15.5)
WBC: 10.8 10*3/uL — ABNORMAL HIGH (ref 4.0–10.5)
nRBC: 0 % (ref 0.0–0.2)

## 2023-04-06 MED ORDER — DOCUSATE SODIUM 100 MG PO CAPS: 100.0000 mg | ORAL_CAPSULE | Freq: Two times a day (BID) | ORAL | Status: AC

## 2023-04-06 MED ORDER — ACETAMINOPHEN 325 MG PO TABS: 650.0000 mg | ORAL_TABLET | Freq: Four times a day (QID) | ORAL | Status: AC | PRN

## 2023-04-06 MED ORDER — BISACODYL 5 MG PO TBEC: 5.0000 mg | DELAYED_RELEASE_TABLET | Freq: Every day | ORAL | Status: AC | PRN

## 2023-04-06 MED ORDER — ENSURE SURGERY PO LIQD: 237.0000 mL | Freq: Three times a day (TID) | ORAL | Status: AC

## 2023-04-06 MED ORDER — FUROSEMIDE 20 MG PO TABS: 20.0000 mg | ORAL_TABLET | Freq: Every day | ORAL | Status: DC | PRN

## 2023-04-06 NOTE — TOC Transition Note (Signed)
Transition of Care [facility]) - CM/SW Discharge Note   Patient Details  Name: [name] MRN: [id number] Date of Birth: 1941-02-18  [facility] CM/SW Contact:  Larrie Kass, LCSW Phone Number: 04/06/2023, 11:51 AM   Clinical Narrative:     Pt to d/c to [facility], Room assignment 1202P, call report [phone]. PTAR called , TOC sign off.   Final next level of care: Skilled Nursing Facility Barriers to Discharge: No Barriers Identified   Patient Goals and CMS Choice CMS Medicare.gov Compare Post Acute Care list provided to:: Patient Choice offered to / list presented to : Adult Children, Patient  Discharge Placement                  Patient to be transferred to facility by: EMs Name of family member notified: [place], Zukauskas (Son) Patient and family notified of of transfer: 04/06/23  Discharge Plan and Services Additional resources added to the After Visit Summary for   In-house Referral: Clinical Social Work              DME Arranged: Dan Humphreys rolling DME Agency: [employer] Date DME Agency Contacted: 03/29/23 Time DME Agency Contacted: 1056 Representative spoke with at DME Agency: Zack HH Arranged: PT, OT, Nurse's Aide HH Agency: [facility] Date [facility] Agency Contacted: 03/29/23 Time HH Agency Contacted: 1056 Representative spoke with at [facility] Agency: kelly  Social Determinants of Health (SDOH) Interventions SDOH Screenings   Food Insecurity: No Food Insecurity (03/21/2023)  Housing: Low Risk  (03/21/2023)  Transportation Needs: No Transportation Needs (03/21/2023)  Utilities: Not At Risk (03/21/2023)  Depression (PHQ2-9): Low Risk  (05/25/2022)  Financial Resource Strain: Low Risk  (01/19/2022)  Stress: No Stress Concern Present (01/19/2022)  Tobacco Use: Medium Risk (03/26/2023)     Readmission Risk Interventions    03/23/2023    3:27 PM  Readmission Risk Prevention Plan  Transportation Screening Complete  PCP or Specialist Appt  within 5-7 Days Complete  Home Care Screening Complete  Medication Review (RN CM) Complete

## 2023-04-06 NOTE — Plan of Care (Signed)
  Problem: Pain Managment: Goal: General experience of comfort will improve Outcome: Progressing   Problem: Safety: Goal: Ability to remain free from injury will improve Outcome: Progressing   Problem: Skin Integrity: Goal: Risk for impaired skin integrity will decrease Outcome: Progressing   

## 2023-04-06 NOTE — Discharge Summary (Signed)
Physician Discharge Summary  [name] ZOX:[id number] DOB: 10/29/1940 DOA: 03/20/2023  PCP: Philip Aspen, Limmie Patricia, MD  Admit date: 03/20/2023 Discharge date: 04/06/2023  Admitted From: Home Discharge disposition: SNF  Recommendations at discharge:  Outpatient referral to oncology has been ordered Eliquis has been resumed.  Watch out for any bleeding episodes Use Lasix as needed for worsening swelling.  Continue compression stockings Follow-up with general surgery as an outpatient   Brief narrative: [name] is a 82 y.o. female with PMH significant for HTN, HLD, CHF, paroxysmal A-fib s/p pacemaker, PE 12/2022 on Eliquis anemia, COPD, GERD, arthritis. 8/13, patient presented to the ED with complaint of shortness of breath, wheezing along with dark sticky stools.  In the ED, patient had a hemoglobin of 7.1. CT abdomen showed proximal ascending colon mass, likely malignant. Admitted to [facility] GI and general surgery were consulted 8/16, colonoscopy showed nearly obstructing colon mass 8/19, underwent exploratory ectomy with extended right hemicolectomy and primary anastomosis. 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  Subjective: Patient was seen and examined this morning.   Lying in bed.  Not in distress.  No blood loss in bowel movement.  Son at bedside.  Hospital course: Newly diagnosed colon cancer S/p extended right hemicolectomy -8/19 Dr. Corliss Skains Postop ileus resolved.  Currently on soft diet. Pathology showed high-grade invasive adenocarcinoma the colon. CT chest without metastasis Oncology evaluation as an outpatient may be needed.  I ordered for outpatient referral to oncology.  Acute symptomatic anemia Secondary to blood loss from colon cancer.  Was also on anticoagulation for history of A-fib and recent PE. Hemoglobin was low at 7.1 on admission.  Received 2 units of PRBC transfusion.  EGD unremarkable. 8/23, patient  started having bloody bowel movement.  Hemoglobin started to drop as well.  Received 2 units of PRBC transfusion.  Rectal bleeding has eventually stopped now.  Hemoglobin is stable for last 3 days. Per general surgery recommendation, Eliquis was resumed on 8/29.  Hemoglobin remained stable. Recent Labs    12/08/22 0430 12/09/22 0351 03/21/23 1212 03/22/23 0414 04/02/23 1058 04/03/23 0417 04/04/23 0420 04/05/23 0419 04/06/23 0419  HGB 9.1*   < > 10.8*   < > 6.8* 8.4* 7.7* 7.9* 8.6*  MCV 78.2*   < > 73.3*   < > 80.4 82.9 83.7 85.1 86.3  VITAMINB12  --   --  906  --   --   --   --   --   --   FOLATE  --   --  10.2  --   --   --   --   --   --   FERRITIN 8*  --  7*  --   --   --   --   --   --   TIBC 363  --  314  --   --   --   --   --   --   IRON 20*  --  19*  --   --   --   --   --   --    < > = values in this interval not displayed.   Recent PE May 2024, CT angio chest showed few small peripheral segmental right-sided pulm emboli.  Ultrasound duplex lower extremities were negative for DVT.  Prior to that, patient was already on Coumadin but was not consistent compliant.  Following PE, patient was switched from Coumadin to Eliquis.  Eliquis which  was held this admission  Per general surgery recommendation, Eliquis resumed on 8/29  Paroxysmal A-fib Tachybradycardia syndrome s/p PPM Anticoagulation plan as above   Chronic diastolic heart failure  bilateral lower extremity edema Euvolemic.   Most recent echo from May 2024 with EF 55 to 60%, grade 1 diastolic dysfunction Blood pressure running low normal.  Not on any regular blood pressure medicine.  Given her bilateral pedal edema, I would start her on Lasix as needed.  Continue compression stockings.   GERD Continue PPI   Obesity  Body mass index is 30.51 kg/m. Patient has been advised to make an attempt to improve diet and exercise patterns to aid in weight loss.  Impaired mobility PT obtained.  SNF recommended.  Goals of  care   Code Status: Full Code   Wounds:  - Incision (Closed) 03/26/23 Abdomen Other (Comment) (Active)  Date First Assessed/Time First Assessed: 03/26/23 1223   Location: Abdomen  Location Orientation: Other (Comment)    Assessments 03/26/2023 12:34 PM 04/05/2023  9:28 AM  Dressing Type Honeycomb None  Dressing Clean, Dry, Intact --  Site / Wound Assessment Dressing in place / Unable to assess --  Margins Attached edges (approximated) --  Closure Staples Staples  Drainage Amount None None  Treatment Ice applied --     No associated orders.    Discharge Exam:   Vitals:   04/05/23 2057 04/06/23 0000 04/06/23 0510 04/06/23 0617  BP: 114/61 122/62 109/62   Pulse: 84 74 70   Resp: 18 18 16    Temp: 98 F (36.7 C) 98 F (36.7 C) 97.9 F (36.6 C)   TempSrc: Oral Oral Oral   SpO2: 100% 100% 99% 98%  Weight:      Height:        Body mass index is 30.51 kg/m.   General exam: Pleasant elderly African-American female.  Not in distress Skin: No rashes, lesions or ulcers. HEENT: Atraumatic, normocephalic, no obvious bleeding Lungs: Clear to auscultation bilaterally CVS: Regular rate and rhythm, no murmur GI/Abd soft, mild appropriate postsurgical tenderness, nondistended, bowel sound present CNS: Alert, awake, oriented x 3 Psychiatry: Mood appropriate Extremities: Improving bilateral pedal edema, no calf tenderness.  Follow ups:    Contact information for follow-up providers     [facility] Follow up.   Specialty: Home Health Services Why: Home Health will follow up with you 24hrs to 48hrs after discharge Contact information: [address] [place] [address] [phone]         [facility] Follow up.   Why: DME compsy that supplied walker        [facility] Go on 04/10/2023.   Specialty: General Surgery Why: Your appointment is 9/3 at 10am with a nurse with to have your staples removed. Arrive early to check  in, fill out paperwork, Designer, fashion/clothing ID and Insurance account manager information: [address] [place] [address] [phone]        [facility] [place], [place]. Go on 04/23/2023.   Specialty: General Surgery Why: Your appointment is 9/16 at 1:30pm Please arrive 15 minutes early for check in. Contact information: [address] [place] [address] [phone]              Contact information for after-discharge care     Destination     [facility], [facility] Preferred SNF .   Service: Skilled Nursing Contact information: [address] [place] [address] [phone]                     Discharge Instructions:   Discharge Instructions     Ambulatory referral to Hematology / Oncology   Complete by: As directed    Colon cancer s/p surgical resection   Call MD for:  difficulty breathing, headache or visual disturbances   Complete by: As directed    Call MD for:  extreme fatigue   Complete by: As directed    Call MD for:  hives   Complete by: As directed    Call MD for:  persistant dizziness or light-headedness   Complete by: As directed    Call MD for:  persistant nausea and vomiting   Complete by: As directed    Call MD for:  severe uncontrolled pain   Complete by: As directed    Call MD for:  temperature >100.4   Complete by: As directed    Diet general   Complete by: As directed    Discharge instructions   Complete by: As directed    Recommendations at discharge:   Outpatient referral to oncology has been ordered  Eliquis has been resumed.  Watch out for any bleeding episodes .           Use Lasix as needed for worsening swelling.  Continue compression stockings  Follow-up with general surgery as an outpatient  General discharge instructions: Follow with Primary MD Philip Aspen, Limmie Patricia, MD in 7 days  Please request your PCP  to go over your  hospital tests, procedures, radiology results at the follow up. Please get your medicines reviewed and adjusted.  Your PCP may decide to repeat certain labs or tests as needed. Do not drive, operate heavy machinery, perform activities at heights, swimming or participation in water activities or provide baby sitting services if your were admitted for syncope or siezures until you have seen by Primary MD or a Neurologist and advised to do so again. [place] Controlled Substance Reporting System database was reviewed. Do not drive, operate heavy machinery, perform activities at heights, swim, participate in water activities or provide baby-sitting services while on medications for pain, sleep and mood until your outpatient physician has reevaluated you and advised to do so again.  You are strongly recommended to comply with the dose, frequency and duration of prescribed medications. Activity: As tolerated with Full fall precautions use walker/cane & assistance as needed Avoid using any recreational substances like cigarette, tobacco, alcohol, or non-prescribed drug. If you experience worsening of your admission symptoms, develop shortness of breath, life threatening emergency, suicidal or homicidal thoughts you must seek medical attention immediately by calling 911 or calling your MD immediately  if symptoms less severe. You must read complete instructions/literature along with all the possible adverse reactions/side effects for all the medicines you take and that have been prescribed to you. Take any new medicine only after you have completely understood and accepted all the possible adverse reactions/side effects.  Wear Seat belts while driving. You were cared for by a hospitalist during your hospital stay. If you have any questions about your discharge medications or the care you received while you were in the hospital after you are discharged, you can call the unit and ask to speak with the  hospitalist or the covering physician. Once you are discharged, your primary care physician will handle any further medical issues. Please note that NO REFILLS for any discharge  medications will be authorized once you are discharged, as it is imperative that you return to your primary care physician (or establish a relationship with a primary care physician if you do not have one).   Increase activity slowly   Complete by: As directed        Discharge Medications:   Allergies as of 04/06/2023       Reactions   Albuterol Palpitations, Other (See Comments)   Irregular heartbeat        Medication List     STOP taking these medications    nystatin powder Commonly known as: MYCOSTATIN/NYSTOP       TAKE these medications    acetaminophen 325 MG tablet Commonly known as: TYLENOL Take 2 tablets (650 mg total) by mouth every 6 (six) hours as needed for mild pain (or Fever >/= 101).   apixaban 5 MG Tabs tablet Commonly known as: ELIQUIS Take 1 tablet (5 mg total) by mouth 2 (two) times daily. Start this once you are done with starter pack What changed: additional instructions   atorvastatin 20 MG tablet Commonly known as: LIPITOR Take 1 tablet (20 mg total) by mouth daily. SCHEDULE APPT FOR FUTURE REFILLS What changed: additional instructions   bisacodyl 5 MG EC tablet Commonly known as: DULCOLAX Take 1 tablet (5 mg total) by mouth daily as needed for moderate constipation.   docusate sodium 100 MG capsule Commonly known as: COLACE Take 1 capsule (100 mg total) by mouth 2 (two) times daily.   feeding supplement Liqd Take 237 mLs by mouth 3 (three) times daily between meals.   furosemide 20 MG tablet Commonly known as: Lasix Take 1 tablet (20 mg total) by mouth daily as needed for fluid or edema.   levalbuterol 45 MCG/ACT inhaler Commonly known as: Xopenex HFA Inhale 2 puffs into the lungs every 4 (four) hours as needed for wheezing.   montelukast 10 MG  tablet Commonly known as: SINGULAIR Take 1 tablet (10 mg total) by mouth at bedtime.   omeprazole 20 MG capsule Commonly known as: PRILOSEC TAKE 1 CAPSULE EVERY DAY 30 TO 60 MINUTES BEFORE FIRST MEAL OF THE DAY What changed: See the new instructions.   Symbicort 80-4.5 MCG/ACT inhaler Generic drug: budesonide-formoterol INHALE 2 PUFFS INTO THE LUNGS 2 (TWO) TIMES DAILY.               Durable Medical Equipment  (From admission, onward)           Start     Ordered   03/29/23 1106  For home use only DME Walker rolling  Once       Question Answer Comment  Walker: With 5 Inch Wheels   Patient needs a walker to treat with the following condition Colon cancer (HCC)      03/29/23 1105             The results of significant diagnostics from this hospitalization (including imaging, microbiology, ancillary and laboratory) are listed below for reference.    Procedures and Diagnostic Studies:   CT ABDOMEN PELVIS W CONTRAST  Result Date: 03/21/2023 CLINICAL DATA:  Anemia.  Lower abdominal pain. EXAM: CT ABDOMEN AND PELVIS WITH CONTRAST TECHNIQUE: Multidetector CT imaging of the abdomen and pelvis was performed using the standard protocol following bolus administration of intravenous contrast. RADIATION DOSE REDUCTION: This exam was performed according to the departmental dose-optimization program which includes automated exposure control, adjustment of the mA and/or kV according to patient size and/or use of iterative  reconstruction technique. CONTRAST:  OMNIPAQUE IOHEXOL 300 MG/ML  SOLN COMPARISON:  CT abdomen pelvis dated 11/23/2020. FINDINGS: Lower chest: Left lung base atelectasis/scarring. Cardiac pacemaker wires noted. No intra-abdominal free air or free fluid. Hepatobiliary: The liver is unremarkable. There are multiple stones within the gallbladder. There is minimal haziness of the fat planes chest into the gallbladder. Ultrasound may provide better evaluation if  there is clinical concern for acute cholecystitis. Pancreas: Unremarkable. No pancreatic ductal dilatation or surrounding inflammatory changes. Spleen: Normal in size without focal abnormality. Adrenals/Urinary Tract: The adrenal glands unremarkable. Similar appearance of a cluster of rim calcified cystic lesions arising from the upper pole of the right kidney. There is no hydronephrosis on either side. There is symmetric enhancement and excretion of contrast by both kidneys. The visualized ureters appear unremarkable. The urinary bladder is collapsed. Stomach/Bowel: There is sigmoid diverticulosis. There is intussusception of the distal small bowel into the colon extending to the level of the proximal transverse colon. There is a somewhat lobulated mass involving the proximal ascending colon measuring approximately 5 x 9 cm in greatest axial dimensions and functioning as a lead point and consistent with malignancy. Further evaluation with colonoscopy is recommended. There is no bowel obstruction. The appendix is normal. Vascular/Lymphatic: The abdominal aorta and IVC unremarkable. No portal venous gas. Several small rounded lymph node adjacent to the intussusception in the right lower quadrant suspicious for metastatic nodal involvement. These measure up to 7 mm. Reproductive: Hysterectomy.  No adnexal masses. Other: Small fat containing umbilical and supraumbilical hernia. No bowel herniation or fluid collection. Musculoskeletal: Osteopenia with degenerative changes. No acute osseous pathology. IMPRESSION: 1. 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 Further evaluation with colonoscopy is recommended. 2. Intussusception of the distal small bowel into the colon extending to the level of the proximal transverse colon. No bowel obstruction. 3. Sigmoid diverticulosis. 4. Cholelithiasis. Electronically Signed   By: Elgie Collard M.D.   On: 03/21/2023 21:23   DG Chest 2 View  Result Date:  03/20/2023 CLINICAL DATA:  Shortness of breath EXAM: CHEST - 2 VIEW COMPARISON:  12/07/2022 FINDINGS: Lungs are clear.  No pleural effusion or pneumothorax. The heart is normal in size.  Left subclavian pacemaker. Degenerative changes of the visualized thoracolumbar spine. IMPRESSION: Normal chest radiographs. Electronically Signed   By: Charline Bills M.D.   On: 03/20/2023 22:48     Labs:   Basic Metabolic Panel: Recent Labs  Lab 04/02/23 1058 04/03/23 0417 04/04/23 0420 04/05/23 0419  NA 144 140 143 142  K 3.7 3.3* 3.5 3.4*  CL 115* 112* 116* 115*  CO2 19* 19* 20* 21*  GLUCOSE 94 96 116* 87  BUN 20 21 19 16   CREATININE 0.78 0.89 0.84 0.91  CALCIUM 7.9* 7.9* 8.0* 8.0*   GFR Estimated Creatinine Clearance: 50.8 mL/min (by C-G formula based on SCr of 0.91 mg/dL). Liver Function Tests: No results for input(s): "AST", "ALT", "ALKPHOS", "BILITOT", "PROT", "ALBUMIN" in the last 168 hours. No results for input(s): "LIPASE", "AMYLASE" in the last 168 hours. No results for input(s): "AMMONIA" in the last 168 hours. Coagulation profile No results for input(s): "INR", "PROTIME" in the last 168 hours.  CBC: Recent Labs  Lab 04/02/23 1058 04/03/23 0417 04/04/23 0420 04/05/23 0419 04/06/23 0419  WBC 8.1 7.7 9.0 9.3 10.8*  NEUTROABS  --  4.2 6.6 6.3 7.6  HGB 6.8* 8.4* 7.7* 7.9* 8.6*  HCT 22.2* 27.2* 25.2* 26.3* 27.8*  MCV 80.4 82.9 83.7 85.1 86.3  PLT  206 219 279 320 363   Cardiac Enzymes: No results for input(s): "CKTOTAL", "CKMB", "CKMBINDEX", "TROPONINI" in the last 168 hours. BNP: Invalid input(s): "POCBNP" CBG: No results for input(s): "GLUCAP" in the last 168 hours. D-Dimer No results for input(s): "DDIMER" in the last 72 hours. Hgb A1c No results for input(s): "HGBA1C" in the last 72 hours. Lipid Profile No results for input(s): "CHOL", "HDL", "LDLCALC", "TRIG", "CHOLHDL", "LDLDIRECT" in the last 72 hours. Thyroid function studies No results for input(s): "TSH",  "T4TOTAL", "T3FREE", "THYROIDAB" in the last 72 hours.  Invalid input(s): "FREET3" Anemia work up No results for input(s): "VITAMINB12", "FOLATE", "FERRITIN", "TIBC", "IRON", "RETICCTPCT" in the last 72 hours. Microbiology No results found for this or any previous visit (from the past 240 hour(s)).  Time coordinating discharge: 45 minutes  Signed: Iden Stripling  [facility] 04/06/2023, 11:35 AM

## 2023-04-06 NOTE — Progress Notes (Signed)
Report given to Nurse at [facility]. Ptar in enroute to facility.

## 2023-04-06 NOTE — Progress Notes (Signed)
Progress Note  11 Days Post-Op  Subjective: Pt continues to feel better - she reports increased PO intake. She denies blood in her stool. Hgb stable on Elliquis  Objective: Vital signs in last 24 hours: Temp:  [97.9 F (36.6 C)-98.1 F (36.7 C)] 97.9 F (36.6 C) (08/30 0510) Pulse Rate:  [70-84] 70 (08/30 0510) Resp:  [16-18] 16 (08/30 0510) BP: (109-122)/(52-62) 109/62 (08/30 0510) SpO2:  [98 %-100 %] 98 % (08/30 0617) Last BM Date : 04/05/23  Intake/Output from previous day: 08/29 0701 - 08/30 0700 In: 240 [P.O.:240] Out: -  Intake/Output this shift: No intake/output data recorded.  PE: General: pleasant, WD, elderly female who is laying in bed in NAD Heart: regular, rate, and rhythm.   Lungs:  Respiratory effort nonlabored Abd: soft, NT, ND, incision C/D/I with staples present    Lab Results:  Recent Labs    04/05/23 0419 04/06/23 0419  WBC 9.3 10.8*  HGB 7.9* 8.6*  HCT 26.3* 27.8*  PLT 320 363   BMET Recent Labs    04/04/23 0420 04/05/23 0419  NA 143 142  K 3.5 3.4*  CL 116* 115*  CO2 20* 21*  GLUCOSE 116* 87  BUN 19 16  CREATININE 0.84 0.91  CALCIUM 8.0* 8.0*   PT/INR No results for input(s): "LABPROT", "INR" in the last 72 hours. CMP     Component Value Date/Time   NA 142 04/05/2023 0419   NA 143 04/06/2020 1327   K 3.4 (L) 04/05/2023 0419   CL 115 (H) 04/05/2023 0419   CO2 21 (L) 04/05/2023 0419   GLUCOSE 87 04/05/2023 0419   BUN 16 04/05/2023 0419   BUN 15 04/06/2020 1327   CREATININE 0.91 04/05/2023 0419   CREATININE 1.08 05/05/2011 1536   CALCIUM 8.0 (L) 04/05/2023 0419   PROT 4.6 (L) 03/24/2023 0422   ALBUMIN 2.2 (L) 03/24/2023 0422   AST 28 03/24/2023 0422   ALT 16 03/24/2023 0422   ALKPHOS 86 03/24/2023 0422   BILITOT 1.1 03/24/2023 0422   GFRNONAA >60 04/05/2023 0419   GFRNONAA 50 (L) 05/05/2011 1536   GFRAA 60 04/06/2020 1327   GFRAA 60 (L) 05/05/2011 1536   Lipase     Component Value Date/Time   LIPASE 29  11/22/2020 2212       Studies/Results: No results found.  Anti-infectives: Anti-infectives (From admission, onward)    Start     Dose/Rate Route Frequency Ordered Stop   03/26/23 2245  cefoTEtan (CEFOTAN) 2 g in sodium chloride 0.9 % 100 mL IVPB        2 g 200 mL/hr over 30 Minutes Intravenous Every 12 hours 03/26/23 2158 03/27/23 0056   03/26/23 0900  cefoTEtan (CEFOTAN) 2 g in sodium chloride 0.9 % 100 mL IVPB        2 g 200 mL/hr over 30 Minutes Intravenous  Once 03/25/23 1009 03/26/23 1038        Assessment/Plan  POD#8 s/p Exploratory laparotomy, lysis of adhesions, right hemicolectomy with primary anastomosis by Dr. Corliss Skains for Ascending colon cancer by Dr. 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  - anticoagulation held due to blood in stool. S/p 1 PRBC 8/24 and 1 u PRBC 8/26.no further blood in her stool. Hgb stable on ]DOAC today. - Mobilize, PT recommend  SNF initially and then [facility] PT. Patient's family concerned about going home and would like to consider SNF if possible. PT feels she is appropriate for SNF. TOC consult placed for assistance pursuing SNF options and appears they are looking at [facility].  -OK for D/C today from surgical standpoint FEN - soft diet. Boost. IVF per primary  VTE - SCDs, eliquis ID - Cefotetan peri-op, none currently.  Foley - Out POD 1, voiding   A. fib Arthritis Asthma COPD HTN HLD pacemaker secondary to tachy-brady syndrome Chronic diastolic HF with LF of 55-60% PE in 2024 on Eliquis Anemia Obesity GERD   LOS: 15 days   I reviewed hospitalist notes, last 24 h vitals and pain scores, last 48 h intake and output, and last 24 h labs and trends.   Vanita Panda, MD [facility]  Surgery 04/06/2023, 8:46 AM Please see Amion for pager number during day hours 7:00am-4:30pm

## 2023-04-06 NOTE — TOC Progression Note (Signed)
Transition of Care [facility]) - Progression Note    Patient Details  Name: [name] MRN: [id number] Date of Birth: [birth year]  [facility] CM/SW Contact  Larrie Kass, LCSW Phone Number: 04/06/2023, 9:17 AM  Clinical Narrative:     Pt's insurance [employer] was approved for Newborn. TOC to follow.  Expected Discharge Plan: Skilled Nursing Facility Barriers to Discharge: Continued Medical Work up  Expected Discharge Plan and Services In-house Referral: Clinical Social Work     Living arrangements for the past 2 months: Hotel/Motel                 DME Arranged: Dan Humphreys rolling DME Agency: [employer] Date DME Agency Contacted: 03/29/23 Time DME Agency Contacted: 1056 Representative spoke with at DME Agency: Zack HH Arranged: PT, OT, Nurse's Aide HH Agency: [facility] Date [facility] Agency Contacted: 03/29/23 Time HH Agency Contacted: 1056 Representative spoke with at [facility] Agency: kelly   Social Determinants of Health (SDOH) Interventions SDOH Screenings   Food Insecurity: No Food Insecurity (03/21/2023)  Housing: Low Risk  (03/21/2023)  Transportation Needs: No Transportation Needs (03/21/2023)  Utilities: Not At Risk (03/21/2023)  Depression (PHQ2-9): Low Risk  (05/25/2022)  Financial Resource Strain: Low Risk  (01/19/2022)  Stress: No Stress Concern Present (01/19/2022)  Tobacco Use: Medium Risk (03/26/2023)    Readmission Risk Interventions    03/23/2023    3:27 PM  Readmission Risk Prevention Plan  Transportation Screening Complete  PCP or Specialist Appt within 5-7 Days Complete  Home Care Screening Complete  Medication Review (RN CM) Complete

## 2023-04-10 DIAGNOSIS — J439 Emphysema, unspecified: Secondary | ICD-10-CM | POA: Diagnosis not present

## 2023-04-10 DIAGNOSIS — I495 Sick sinus syndrome: Secondary | ICD-10-CM | POA: Diagnosis not present

## 2023-04-10 DIAGNOSIS — I5032 Chronic diastolic (congestive) heart failure: Secondary | ICD-10-CM | POA: Diagnosis not present

## 2023-04-10 DIAGNOSIS — Z8639 Personal history of other endocrine, nutritional and metabolic disease: Secondary | ICD-10-CM | POA: Diagnosis not present

## 2023-04-10 DIAGNOSIS — J4540 Moderate persistent asthma, uncomplicated: Secondary | ICD-10-CM | POA: Diagnosis not present

## 2023-04-10 DIAGNOSIS — D62 Acute posthemorrhagic anemia: Secondary | ICD-10-CM | POA: Diagnosis not present

## 2023-04-10 DIAGNOSIS — Z7901 Long term (current) use of anticoagulants: Secondary | ICD-10-CM | POA: Diagnosis not present

## 2023-04-10 DIAGNOSIS — I110 Hypertensive heart disease with heart failure: Secondary | ICD-10-CM | POA: Diagnosis not present

## 2023-04-10 DIAGNOSIS — E785 Hyperlipidemia, unspecified: Secondary | ICD-10-CM | POA: Diagnosis not present

## 2023-04-10 DIAGNOSIS — Z950 Presence of cardiac pacemaker: Secondary | ICD-10-CM | POA: Diagnosis not present

## 2023-04-10 DIAGNOSIS — Z86711 Personal history of pulmonary embolism: Secondary | ICD-10-CM | POA: Diagnosis not present

## 2023-04-10 DIAGNOSIS — I480 Paroxysmal atrial fibrillation: Secondary | ICD-10-CM | POA: Diagnosis not present

## 2023-04-10 DIAGNOSIS — C182 Malignant neoplasm of ascending colon: Secondary | ICD-10-CM | POA: Diagnosis not present

## 2023-04-11 DIAGNOSIS — R2681 Unsteadiness on feet: Secondary | ICD-10-CM | POA: Diagnosis not present

## 2023-04-11 DIAGNOSIS — E669 Obesity, unspecified: Secondary | ICD-10-CM | POA: Diagnosis not present

## 2023-04-11 DIAGNOSIS — R600 Localized edema: Secondary | ICD-10-CM | POA: Diagnosis not present

## 2023-04-11 DIAGNOSIS — R0602 Shortness of breath: Secondary | ICD-10-CM | POA: Diagnosis not present

## 2023-04-11 DIAGNOSIS — M6281 Muscle weakness (generalized): Secondary | ICD-10-CM | POA: Diagnosis not present

## 2023-04-12 DIAGNOSIS — Z9049 Acquired absence of other specified parts of digestive tract: Secondary | ICD-10-CM | POA: Diagnosis not present

## 2023-04-12 DIAGNOSIS — Z7901 Long term (current) use of anticoagulants: Secondary | ICD-10-CM | POA: Diagnosis not present

## 2023-04-12 DIAGNOSIS — I4891 Unspecified atrial fibrillation: Secondary | ICD-10-CM | POA: Diagnosis not present

## 2023-04-12 DIAGNOSIS — Z86711 Personal history of pulmonary embolism: Secondary | ICD-10-CM | POA: Diagnosis not present

## 2023-04-12 DIAGNOSIS — Z87891 Personal history of nicotine dependence: Secondary | ICD-10-CM | POA: Diagnosis not present

## 2023-04-12 DIAGNOSIS — I5032 Chronic diastolic (congestive) heart failure: Secondary | ICD-10-CM | POA: Diagnosis not present

## 2023-04-12 DIAGNOSIS — J45909 Unspecified asthma, uncomplicated: Secondary | ICD-10-CM | POA: Diagnosis not present

## 2023-04-12 DIAGNOSIS — J439 Emphysema, unspecified: Secondary | ICD-10-CM | POA: Diagnosis not present

## 2023-04-12 DIAGNOSIS — Z483 Aftercare following surgery for neoplasm: Secondary | ICD-10-CM | POA: Diagnosis not present

## 2023-04-13 DIAGNOSIS — R600 Localized edema: Secondary | ICD-10-CM | POA: Diagnosis not present

## 2023-04-13 DIAGNOSIS — E669 Obesity, unspecified: Secondary | ICD-10-CM | POA: Diagnosis not present

## 2023-04-13 DIAGNOSIS — M6281 Muscle weakness (generalized): Secondary | ICD-10-CM | POA: Diagnosis not present

## 2023-04-13 DIAGNOSIS — R2681 Unsteadiness on feet: Secondary | ICD-10-CM | POA: Diagnosis not present

## 2023-04-13 DIAGNOSIS — R0602 Shortness of breath: Secondary | ICD-10-CM | POA: Diagnosis not present

## 2023-04-17 DIAGNOSIS — E876 Hypokalemia: Secondary | ICD-10-CM | POA: Diagnosis not present

## 2023-04-17 DIAGNOSIS — R600 Localized edema: Secondary | ICD-10-CM | POA: Diagnosis not present

## 2023-04-17 DIAGNOSIS — J4540 Moderate persistent asthma, uncomplicated: Secondary | ICD-10-CM | POA: Diagnosis not present

## 2023-04-17 DIAGNOSIS — I480 Paroxysmal atrial fibrillation: Secondary | ICD-10-CM | POA: Diagnosis not present

## 2023-04-17 DIAGNOSIS — I5032 Chronic diastolic (congestive) heart failure: Secondary | ICD-10-CM | POA: Diagnosis not present

## 2023-04-18 DIAGNOSIS — I5032 Chronic diastolic (congestive) heart failure: Secondary | ICD-10-CM | POA: Diagnosis not present

## 2023-04-18 DIAGNOSIS — Z8639 Personal history of other endocrine, nutritional and metabolic disease: Secondary | ICD-10-CM | POA: Diagnosis not present

## 2023-04-18 DIAGNOSIS — I110 Hypertensive heart disease with heart failure: Secondary | ICD-10-CM | POA: Diagnosis not present

## 2023-04-20 DIAGNOSIS — Z483 Aftercare following surgery for neoplasm: Secondary | ICD-10-CM | POA: Diagnosis not present

## 2023-04-20 DIAGNOSIS — R2681 Unsteadiness on feet: Secondary | ICD-10-CM | POA: Diagnosis not present

## 2023-04-20 DIAGNOSIS — J4540 Moderate persistent asthma, uncomplicated: Secondary | ICD-10-CM | POA: Diagnosis not present

## 2023-04-20 DIAGNOSIS — C182 Malignant neoplasm of ascending colon: Secondary | ICD-10-CM | POA: Diagnosis not present

## 2023-04-20 DIAGNOSIS — R0602 Shortness of breath: Secondary | ICD-10-CM | POA: Diagnosis not present

## 2023-04-20 DIAGNOSIS — I480 Paroxysmal atrial fibrillation: Secondary | ICD-10-CM | POA: Diagnosis not present

## 2023-04-20 DIAGNOSIS — D62 Acute posthemorrhagic anemia: Secondary | ICD-10-CM | POA: Diagnosis not present

## 2023-04-20 DIAGNOSIS — M6281 Muscle weakness (generalized): Secondary | ICD-10-CM | POA: Diagnosis not present

## 2023-04-20 DIAGNOSIS — Z86711 Personal history of pulmonary embolism: Secondary | ICD-10-CM | POA: Diagnosis not present

## 2023-04-20 DIAGNOSIS — I495 Sick sinus syndrome: Secondary | ICD-10-CM | POA: Diagnosis not present

## 2023-04-20 DIAGNOSIS — I5032 Chronic diastolic (congestive) heart failure: Secondary | ICD-10-CM | POA: Diagnosis not present

## 2023-04-20 DIAGNOSIS — R600 Localized edema: Secondary | ICD-10-CM | POA: Diagnosis not present

## 2023-04-20 DIAGNOSIS — E669 Obesity, unspecified: Secondary | ICD-10-CM | POA: Diagnosis not present

## 2023-04-23 DIAGNOSIS — C182 Malignant neoplasm of ascending colon: Secondary | ICD-10-CM | POA: Diagnosis not present

## 2023-04-23 DIAGNOSIS — Z483 Aftercare following surgery for neoplasm: Secondary | ICD-10-CM | POA: Diagnosis not present

## 2023-04-23 DIAGNOSIS — I110 Hypertensive heart disease with heart failure: Secondary | ICD-10-CM | POA: Diagnosis not present

## 2023-04-23 DIAGNOSIS — I5032 Chronic diastolic (congestive) heart failure: Secondary | ICD-10-CM | POA: Diagnosis not present

## 2023-04-23 DIAGNOSIS — D630 Anemia in neoplastic disease: Secondary | ICD-10-CM | POA: Diagnosis not present

## 2023-04-23 DIAGNOSIS — J439 Emphysema, unspecified: Secondary | ICD-10-CM | POA: Diagnosis not present

## 2023-04-23 DIAGNOSIS — I480 Paroxysmal atrial fibrillation: Secondary | ICD-10-CM | POA: Diagnosis not present

## 2023-04-23 DIAGNOSIS — J4489 Other specified chronic obstructive pulmonary disease: Secondary | ICD-10-CM | POA: Diagnosis not present

## 2023-04-23 DIAGNOSIS — D500 Iron deficiency anemia secondary to blood loss (chronic): Secondary | ICD-10-CM | POA: Diagnosis not present

## 2023-04-24 ENCOUNTER — Telehealth: Payer: Self-pay | Admitting: Internal Medicine

## 2023-04-24 NOTE — Telephone Encounter (Signed)
Left message on machine for [name] to return our call.

## 2023-04-24 NOTE — Telephone Encounter (Signed)
Alan Mulder, PT with [facility] [phone] *Ok to leave a detailed message on this line  Verbal Orders - Physical Therapy  1 x a wk for 9 wks

## 2023-04-25 ENCOUNTER — Inpatient Hospital Stay: Payer: Medicare HMO | Attending: Nurse Practitioner | Admitting: Nurse Practitioner

## 2023-04-25 ENCOUNTER — Inpatient Hospital Stay: Payer: Medicare HMO

## 2023-04-25 VITALS — BP 107/42 | HR 70 | Temp 97.8°F | Resp 16 | Ht 66.0 in | Wt 201.7 lb

## 2023-04-25 DIAGNOSIS — Z86711 Personal history of pulmonary embolism: Secondary | ICD-10-CM | POA: Insufficient documentation

## 2023-04-25 DIAGNOSIS — Z7901 Long term (current) use of anticoagulants: Secondary | ICD-10-CM | POA: Insufficient documentation

## 2023-04-25 DIAGNOSIS — C184 Malignant neoplasm of transverse colon: Secondary | ICD-10-CM | POA: Diagnosis not present

## 2023-04-25 DIAGNOSIS — C182 Malignant neoplasm of ascending colon: Secondary | ICD-10-CM

## 2023-04-25 DIAGNOSIS — Z79899 Other long term (current) drug therapy: Secondary | ICD-10-CM | POA: Diagnosis not present

## 2023-04-25 DIAGNOSIS — D500 Iron deficiency anemia secondary to blood loss (chronic): Secondary | ICD-10-CM | POA: Insufficient documentation

## 2023-04-25 DIAGNOSIS — Z7951 Long term (current) use of inhaled steroids: Secondary | ICD-10-CM | POA: Insufficient documentation

## 2023-04-25 DIAGNOSIS — Z87891 Personal history of nicotine dependence: Secondary | ICD-10-CM | POA: Insufficient documentation

## 2023-04-25 LAB — CBC WITH DIFFERENTIAL (CANCER CENTER ONLY)
Abs Immature Granulocytes: 0.01 10*3/uL (ref 0.00–0.07)
Basophils Absolute: 0.1 10*3/uL (ref 0.0–0.1)
Basophils Relative: 1 %
Eosinophils Absolute: 0.1 10*3/uL (ref 0.0–0.5)
Eosinophils Relative: 2 %
HCT: 33.1 % — ABNORMAL LOW (ref 36.0–46.0)
Hemoglobin: 10 g/dL — ABNORMAL LOW (ref 12.0–15.0)
Immature Granulocytes: 0 %
Lymphocytes Relative: 26 %
Lymphs Abs: 1.3 10*3/uL (ref 0.7–4.0)
MCH: 26 pg (ref 26.0–34.0)
MCHC: 30.2 g/dL (ref 30.0–36.0)
MCV: 86 fL (ref 80.0–100.0)
Monocytes Absolute: 0.6 10*3/uL (ref 0.1–1.0)
Monocytes Relative: 13 %
Neutro Abs: 2.7 10*3/uL (ref 1.7–7.7)
Neutrophils Relative %: 58 %
Platelet Count: 201 10*3/uL (ref 150–400)
RBC: 3.85 MIL/uL — ABNORMAL LOW (ref 3.87–5.11)
RDW: 28.1 % — ABNORMAL HIGH (ref 11.5–15.5)
WBC Count: 4.8 10*3/uL (ref 4.0–10.5)
nRBC: 0 % (ref 0.0–0.2)

## 2023-04-25 LAB — IRON AND IRON BINDING CAPACITY (CC-WL,HP ONLY)
Iron: 17 ug/dL — ABNORMAL LOW (ref 28–170)
Saturation Ratios: 7 % — ABNORMAL LOW (ref 10.4–31.8)
TIBC: 260 ug/dL (ref 250–450)
UIBC: 243 ug/dL (ref 148–442)

## 2023-04-25 LAB — FERRITIN: Ferritin: 36 ng/mL (ref 11–307)

## 2023-04-25 LAB — CEA (ACCESS): CEA (CHCC): 6.27 ng/mL — ABNORMAL HIGH (ref 0.00–5.00)

## 2023-04-25 NOTE — Telephone Encounter (Signed)
Verbal orders given to  Gracee Q, PT with [facility]

## 2023-04-25 NOTE — Telephone Encounter (Signed)
Left message on machine for Delorise Shiner to return our call.
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
## 2023-04-26 ENCOUNTER — Ambulatory Visit (INDEPENDENT_AMBULATORY_CARE_PROVIDER_SITE_OTHER): Payer: Medicare HMO | Admitting: Internal Medicine

## 2023-04-26 ENCOUNTER — Other Ambulatory Visit: Payer: Self-pay | Admitting: Internal Medicine

## 2023-04-26 ENCOUNTER — Encounter: Payer: Self-pay | Admitting: Internal Medicine

## 2023-04-26 VITALS — BP 124/68 | HR 70 | Temp 97.5°F | Wt 201.1 lb

## 2023-04-26 DIAGNOSIS — J4530 Mild persistent asthma, uncomplicated: Secondary | ICD-10-CM

## 2023-04-26 DIAGNOSIS — I10 Essential (primary) hypertension: Secondary | ICD-10-CM

## 2023-04-26 DIAGNOSIS — I2699 Other pulmonary embolism without acute cor pulmonale: Secondary | ICD-10-CM | POA: Diagnosis not present

## 2023-04-26 DIAGNOSIS — C182 Malignant neoplasm of ascending colon: Secondary | ICD-10-CM

## 2023-04-26 DIAGNOSIS — Z09 Encounter for follow-up examination after completed treatment for conditions other than malignant neoplasm: Secondary | ICD-10-CM

## 2023-04-26 DIAGNOSIS — D649 Anemia, unspecified: Secondary | ICD-10-CM | POA: Diagnosis not present

## 2023-04-26 DIAGNOSIS — I5032 Chronic diastolic (congestive) heart failure: Secondary | ICD-10-CM

## 2023-04-26 MED ORDER — LEVALBUTEROL TARTRATE 45 MCG/ACT IN AERO
2.0000 | INHALATION_SPRAY | RESPIRATORY_TRACT | 1 refills | Status: DC | PRN
Start: 2023-04-26 — End: 2023-04-30

## 2023-04-26 MED ORDER — FUROSEMIDE 20 MG PO TABS
20.0000 mg | ORAL_TABLET | Freq: Every day | ORAL | 0 refills | Status: DC | PRN
Start: 2023-04-26 — End: 2023-05-23

## 2023-04-26 NOTE — Progress Notes (Signed)
Established Patient Office Visit     CC/Reason for Visit: Hospital follow-up  HPI: [name] is a 82 y.o. female who is coming in today for the above mentioned reasons.  She was hospitalized from August 13 through August 30 and diagnosed with acute symptomatic anemia.  She was found to have a near obstructing ascending colonic mass during CT and colonoscopy.  During hospital stay she had a right hemicolectomy.  Eliquis was resumed prior to discharge.  She has already followed up with oncology and it would appear that no chemotherapy is planned.  She has yet to follow-up with surgery but her incisions have healed nicely and bowel function has returned.  She went to [facility] for rehab and is now back home with physical therapy.  She is feeling improved.  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, no surgery in kidneys    Past Surgical History:  Procedure Laterality Date   ABDOMINAL HYSTERECTOMY     BIOPSY  03/23/2023   Procedure: BIOPSY;  Surgeon: Lynann Bologna, MD;  Location: [facility];  Service: Gastroenterology;;   BREAST BIOPSY  04/30/2012   Procedure: BREAST BIOPSY WITH NEEDLE LOCALIZATION;  Surgeon: Emelia Loron, MD;  Location: [facility];  Service: General;  Laterality: Left;  Left breast wire localization biospy   COLONOSCOPY WITH PROPOFOL N/A 03/23/2023   Procedure: COLONOSCOPY WITH PROPOFOL;  Surgeon: Lynann Bologna, MD;  Location: [facility];  Service: Gastroenterology;  Laterality: N/A;   ESOPHAGOGASTRODUODENOSCOPY (EGD) WITH PROPOFOL N/A 03/23/2023   Procedure: ESOPHAGOGASTRODUODENOSCOPY  (EGD) WITH PROPOFOL;  Surgeon: Lynann Bologna, MD;  [facility]: [facility];  Service: Gastroenterology;  Laterality: N/A;   HEMORRHOID SURGERY     LAPAROTOMY N/A 03/26/2023   Procedure: EXPLORATORY LAPAROTOMY, RIGHT HEMICOLECTOMY; LYSIS OF ADHESIONS;  Surgeon: Manus Rudd, MD;  Location: [facility];  Service: General;  Laterality: N/A;   PACEMAKER INSERTION  02-06-2011   PPM GENERATOR CHANGEOUT N/A 04/19/2020   Procedure: PPM GENERATOR CHANGEOUT;  Surgeon: Marinus Maw, MD;  Location: [facility];  Service: Cardiovascular;  Laterality: N/A;   SUBMUCOSAL TATTOO INJECTION  03/23/2023   Procedure: SUBMUCOSAL TATTOO INJECTION;  Surgeon: Lynann Bologna, MD;  Location: [facility];  Service: Gastroenterology;;   TAH and BSO     TONSILLECTOMY      Social History:  reports that she quit smoking about 45 years ago. Her smoking use included cigarettes. She started smoking about 49 years ago. She has a 4 pack-year smoking history. She has never used smokeless tobacco. She reports that she does not drink alcohol and does not use drugs.  Allergies: Allergies  Allergen Reactions   Albuterol Palpitations and Other (See Comments)    Irregular heartbeat      Family History:  Family History  Problem Relation Age of Onset   Heart disease Mother    Cancer Sister        breast     Current Outpatient Medications:  acetaminophen (TYLENOL) 325 MG tablet, Take 2 tablets (650 mg total) by mouth every 6 (six) hours as needed for mild pain (or Fever >/= 101)., Disp: , Rfl:    apixaban (ELIQUIS) 5 MG TABS tablet, Take 1 tablet (5 mg total) by mouth 2 (two) times daily. Start this once you are done with starter pack (Patient taking differently: Take 5 mg by mouth 2 (two) times daily.), Disp: 180 tablet, Rfl: 1   atorvastatin (LIPITOR) 20 MG tablet, Take 1 tablet (20 mg total) by mouth daily. SCHEDULE APPT FOR FUTURE REFILLS (Patient taking differently: Take 20 mg by mouth daily.), Disp: 90 tablet, Rfl:  1   bisacodyl (DULCOLAX) 5 MG EC tablet, Take 1 tablet (5 mg total) by mouth daily as needed for moderate constipation., Disp: , Rfl:    budesonide-formoterol (SYMBICORT) 80-4.5 MCG/ACT inhaler, INHALE 2 PUFFS INTO THE LUNGS 2 (TWO) TIMES DAILY. (Patient taking differently: Inhale 2 puffs into the lungs 2 (two) times daily.), Disp: 3 each, Rfl: 0   docusate sodium (COLACE) 100 MG capsule, Take 1 capsule (100 mg total) by mouth 2 (two) times daily., Disp: , Rfl:    feeding supplement (ENSURE SURGERY) LIQD, Take 237 mLs by mouth 3 (three) times daily between meals., Disp: , Rfl:    montelukast (SINGULAIR) 10 MG tablet, Take 1 tablet (10 mg total) by mouth at bedtime., Disp: 90 tablet, Rfl: 1   omeprazole (PRILOSEC) 20 MG capsule, TAKE 1 CAPSULE EVERY DAY 30 TO 60 MINUTES BEFORE FIRST MEAL OF THE DAY (Patient taking differently: Take 20 mg by mouth daily.), Disp: 90 capsule, Rfl: 1   furosemide (LASIX) 20 MG tablet, Take 1 tablet (20 mg total) by mouth daily as needed for fluid or edema., Disp: 90 tablet, Rfl: 0   levalbuterol (XOPENEX HFA) 45 MCG/ACT inhaler, Inhale 2 puffs into the lungs every 4 (four) hours as needed for wheezing., Disp: 3 each, Rfl: 1  Review of Systems:  Negative unless indicated in HPI.   Physical Exam: Vitals:   04/26/23 1535  BP: 124/68  Pulse: 70  Temp: (!) 97.5 F (36.4 C)  TempSrc: Oral  SpO2: 97%  Weight: 201 lb 1.6 oz (91.2 kg)    Body mass index is 32.46 kg/m.   Physical Exam Vitals reviewed.  Constitutional:      Appearance: Normal appearance. She is obese.  HENT:     Head: Normocephalic and atraumatic.  Eyes:     Conjunctiva/sclera: Conjunctivae normal.     Pupils: Pupils are equal, round, and reactive to light.  Cardiovascular:     Rate and Rhythm: Normal rate and regular rhythm.  Pulmonary:     Effort: Pulmonary effort is normal.     Breath sounds: Normal breath sounds.  Musculoskeletal:        General: Swelling present.  Skin:     General: Skin is warm and dry.  Neurological:     General: No focal deficit present.     Mental Status: She is alert and oriented to person, place, and time.  Psychiatric:        Mood and Affect: Mood normal.        Behavior: Behavior normal.        Thought Content: Thought content normal.        Judgment: Judgment normal.      Impression and Plan:  Hospital discharge follow-up  Malignant neoplasm of ascending colon (HCC)  Symptomatic anemia  Chronic diastolic heart failure (HCC) -  Furosemide; Take 1 tablet (20 mg total) by mouth daily as needed for fluid or edema.  Dispense: 90 tablet; Refill: 0  Acute pulmonary embolism without acute cor pulmonale, unspecified pulmonary embolism type (HCC)  Essential hypertension  Mild persistent asthma without complication -     Levalbuterol Tartrate; Inhale 2 puffs into the lungs every 4 (four) hours as needed for wheezing.  Dispense: 3 each; Refill: 1   -Hospital charts reviewed in detail. -She is feeling well, has progressed well with physical therapy.  Already had follow-up with oncology in surgical follow-up is pending.  Oncology will be following up labs, she is back on Eliquis. -Medications refilled as requested.  Time spent:32 minutes reviewing chart, interviewing and examining patient and formulating plan of care.     Chaya Jan, MD [facility]

## 2023-04-30 ENCOUNTER — Other Ambulatory Visit: Payer: Self-pay | Admitting: Nurse Practitioner

## 2023-04-30 ENCOUNTER — Telehealth: Payer: Self-pay

## 2023-04-30 DIAGNOSIS — C182 Malignant neoplasm of ascending colon: Secondary | ICD-10-CM | POA: Diagnosis not present

## 2023-04-30 DIAGNOSIS — I110 Hypertensive heart disease with heart failure: Secondary | ICD-10-CM | POA: Diagnosis not present

## 2023-04-30 DIAGNOSIS — I480 Paroxysmal atrial fibrillation: Secondary | ICD-10-CM | POA: Diagnosis not present

## 2023-04-30 DIAGNOSIS — I5032 Chronic diastolic (congestive) heart failure: Secondary | ICD-10-CM | POA: Diagnosis not present

## 2023-04-30 DIAGNOSIS — D630 Anemia in neoplastic disease: Secondary | ICD-10-CM | POA: Diagnosis not present

## 2023-04-30 DIAGNOSIS — J4489 Other specified chronic obstructive pulmonary disease: Secondary | ICD-10-CM | POA: Diagnosis not present

## 2023-04-30 DIAGNOSIS — Z483 Aftercare following surgery for neoplasm: Secondary | ICD-10-CM | POA: Diagnosis not present

## 2023-04-30 DIAGNOSIS — D500 Iron deficiency anemia secondary to blood loss (chronic): Secondary | ICD-10-CM | POA: Diagnosis not present

## 2023-04-30 DIAGNOSIS — J439 Emphysema, unspecified: Secondary | ICD-10-CM | POA: Diagnosis not present

## 2023-04-30 NOTE — Telephone Encounter (Signed)
I called Pt and her son was able to receive the message for his mom , I notified him of the messaged below. He also wanted to know if the pt results were in her Mychart and I let him know that they were.He verbalized understanding.   [name] M.CMA

## 2023-04-30 NOTE — Telephone Encounter (Signed)
-----   Message from Pollyann Samples sent at 04/30/2023  9:47 AM EDT ----- Please call patient with labs. Iron deficiency anemia has improved, please take oral iron or prenatal vitamin with vitamin C once daily, and separate from prilosec for better absorption. CEA nearly normal (improved from 32), we will monitor this in the future. I will see her in 3 months as scheduled.   Thanks, Clayborn Heron NP

## 2023-05-02 DIAGNOSIS — I5032 Chronic diastolic (congestive) heart failure: Secondary | ICD-10-CM | POA: Diagnosis not present

## 2023-05-02 DIAGNOSIS — Z483 Aftercare following surgery for neoplasm: Secondary | ICD-10-CM | POA: Diagnosis not present

## 2023-05-02 DIAGNOSIS — C182 Malignant neoplasm of ascending colon: Secondary | ICD-10-CM | POA: Diagnosis not present

## 2023-05-02 DIAGNOSIS — J4489 Other specified chronic obstructive pulmonary disease: Secondary | ICD-10-CM | POA: Diagnosis not present

## 2023-05-02 DIAGNOSIS — D500 Iron deficiency anemia secondary to blood loss (chronic): Secondary | ICD-10-CM | POA: Diagnosis not present

## 2023-05-02 DIAGNOSIS — J439 Emphysema, unspecified: Secondary | ICD-10-CM | POA: Diagnosis not present

## 2023-05-02 DIAGNOSIS — I110 Hypertensive heart disease with heart failure: Secondary | ICD-10-CM | POA: Diagnosis not present

## 2023-05-02 DIAGNOSIS — D630 Anemia in neoplastic disease: Secondary | ICD-10-CM | POA: Diagnosis not present

## 2023-05-02 DIAGNOSIS — I480 Paroxysmal atrial fibrillation: Secondary | ICD-10-CM | POA: Diagnosis not present

## 2023-05-07 ENCOUNTER — Other Ambulatory Visit: Payer: Self-pay | Admitting: Internal Medicine

## 2023-05-07 DIAGNOSIS — I110 Hypertensive heart disease with heart failure: Secondary | ICD-10-CM | POA: Diagnosis not present

## 2023-05-07 DIAGNOSIS — D630 Anemia in neoplastic disease: Secondary | ICD-10-CM | POA: Diagnosis not present

## 2023-05-07 DIAGNOSIS — I480 Paroxysmal atrial fibrillation: Secondary | ICD-10-CM | POA: Diagnosis not present

## 2023-05-07 DIAGNOSIS — D500 Iron deficiency anemia secondary to blood loss (chronic): Secondary | ICD-10-CM | POA: Diagnosis not present

## 2023-05-07 DIAGNOSIS — J439 Emphysema, unspecified: Secondary | ICD-10-CM | POA: Diagnosis not present

## 2023-05-07 DIAGNOSIS — Z483 Aftercare following surgery for neoplasm: Secondary | ICD-10-CM | POA: Diagnosis not present

## 2023-05-07 DIAGNOSIS — J4489 Other specified chronic obstructive pulmonary disease: Secondary | ICD-10-CM | POA: Diagnosis not present

## 2023-05-07 DIAGNOSIS — I5032 Chronic diastolic (congestive) heart failure: Secondary | ICD-10-CM

## 2023-05-07 DIAGNOSIS — J4530 Mild persistent asthma, uncomplicated: Secondary | ICD-10-CM

## 2023-05-07 DIAGNOSIS — C182 Malignant neoplasm of ascending colon: Secondary | ICD-10-CM | POA: Diagnosis not present

## 2023-05-07 DIAGNOSIS — I2699 Other pulmonary embolism without acute cor pulmonale: Secondary | ICD-10-CM

## 2023-05-14 DIAGNOSIS — J439 Emphysema, unspecified: Secondary | ICD-10-CM | POA: Diagnosis not present

## 2023-05-14 DIAGNOSIS — Z483 Aftercare following surgery for neoplasm: Secondary | ICD-10-CM | POA: Diagnosis not present

## 2023-05-14 DIAGNOSIS — I5032 Chronic diastolic (congestive) heart failure: Secondary | ICD-10-CM | POA: Diagnosis not present

## 2023-05-14 DIAGNOSIS — D630 Anemia in neoplastic disease: Secondary | ICD-10-CM | POA: Diagnosis not present

## 2023-05-14 DIAGNOSIS — I110 Hypertensive heart disease with heart failure: Secondary | ICD-10-CM | POA: Diagnosis not present

## 2023-05-14 DIAGNOSIS — J4489 Other specified chronic obstructive pulmonary disease: Secondary | ICD-10-CM | POA: Diagnosis not present

## 2023-05-14 DIAGNOSIS — I480 Paroxysmal atrial fibrillation: Secondary | ICD-10-CM | POA: Diagnosis not present

## 2023-05-14 DIAGNOSIS — D500 Iron deficiency anemia secondary to blood loss (chronic): Secondary | ICD-10-CM | POA: Diagnosis not present

## 2023-05-14 DIAGNOSIS — C182 Malignant neoplasm of ascending colon: Secondary | ICD-10-CM | POA: Diagnosis not present

## 2023-05-21 ENCOUNTER — Telehealth: Payer: Self-pay | Admitting: Internal Medicine

## 2023-05-21 DIAGNOSIS — I110 Hypertensive heart disease with heart failure: Secondary | ICD-10-CM | POA: Diagnosis not present

## 2023-05-21 DIAGNOSIS — C182 Malignant neoplasm of ascending colon: Secondary | ICD-10-CM | POA: Diagnosis not present

## 2023-05-21 DIAGNOSIS — Z483 Aftercare following surgery for neoplasm: Secondary | ICD-10-CM | POA: Diagnosis not present

## 2023-05-21 DIAGNOSIS — I5032 Chronic diastolic (congestive) heart failure: Secondary | ICD-10-CM | POA: Diagnosis not present

## 2023-05-21 DIAGNOSIS — I480 Paroxysmal atrial fibrillation: Secondary | ICD-10-CM | POA: Diagnosis not present

## 2023-05-21 DIAGNOSIS — J439 Emphysema, unspecified: Secondary | ICD-10-CM | POA: Diagnosis not present

## 2023-05-21 DIAGNOSIS — D500 Iron deficiency anemia secondary to blood loss (chronic): Secondary | ICD-10-CM | POA: Diagnosis not present

## 2023-05-21 DIAGNOSIS — D630 Anemia in neoplastic disease: Secondary | ICD-10-CM | POA: Diagnosis not present

## 2023-05-21 DIAGNOSIS — J4489 Other specified chronic obstructive pulmonary disease: Secondary | ICD-10-CM | POA: Diagnosis not present

## 2023-05-21 NOTE — Telephone Encounter (Signed)
Prescription Request  05/21/2023  LOV: 04/26/2023  What is the name of the medication or equipment? VENTOLIN HFA 108 (90 Base) MCG/ACT inhaler pt want to change to Albuterol ,    furosemide (LASIX) 20 MG tablet  Have you contacted your pharmacy to request a refill? Yes   Which pharmacy would you like this sent to?  [facility] - [place], [place] - [address] Phone: [phone]  Fax: [phone]     Patient notified that their request is being sent to the clinical staff for review and that they should receive a response within 2 business days.   Please advise at [facility] [phone]

## 2023-05-23 ENCOUNTER — Other Ambulatory Visit: Payer: Self-pay | Admitting: *Deleted

## 2023-05-23 DIAGNOSIS — D500 Iron deficiency anemia secondary to blood loss (chronic): Secondary | ICD-10-CM | POA: Diagnosis not present

## 2023-05-23 DIAGNOSIS — J4530 Mild persistent asthma, uncomplicated: Secondary | ICD-10-CM

## 2023-05-23 DIAGNOSIS — I5032 Chronic diastolic (congestive) heart failure: Secondary | ICD-10-CM

## 2023-05-23 DIAGNOSIS — I110 Hypertensive heart disease with heart failure: Secondary | ICD-10-CM | POA: Diagnosis not present

## 2023-05-23 DIAGNOSIS — C182 Malignant neoplasm of ascending colon: Secondary | ICD-10-CM | POA: Diagnosis not present

## 2023-05-23 DIAGNOSIS — I480 Paroxysmal atrial fibrillation: Secondary | ICD-10-CM | POA: Diagnosis not present

## 2023-05-23 DIAGNOSIS — Z483 Aftercare following surgery for neoplasm: Secondary | ICD-10-CM | POA: Diagnosis not present

## 2023-05-23 DIAGNOSIS — J4489 Other specified chronic obstructive pulmonary disease: Secondary | ICD-10-CM | POA: Diagnosis not present

## 2023-05-23 DIAGNOSIS — D630 Anemia in neoplastic disease: Secondary | ICD-10-CM | POA: Diagnosis not present

## 2023-05-23 DIAGNOSIS — J439 Emphysema, unspecified: Secondary | ICD-10-CM | POA: Diagnosis not present

## 2023-05-23 MED ORDER — ALBUTEROL SULFATE HFA 108 (90 BASE) MCG/ACT IN AERS: 2.0000 | INHALATION_SPRAY | RESPIRATORY_TRACT | 1 refills | Status: AC | PRN

## 2023-05-23 MED ORDER — FUROSEMIDE 20 MG PO TABS: 20.0000 mg | ORAL_TABLET | Freq: Every day | ORAL | 0 refills | Status: DC | PRN

## 2023-05-23 NOTE — Telephone Encounter (Signed)
Refills sent

## 2023-05-28 DIAGNOSIS — Z483 Aftercare following surgery for neoplasm: Secondary | ICD-10-CM | POA: Diagnosis not present

## 2023-05-28 DIAGNOSIS — D500 Iron deficiency anemia secondary to blood loss (chronic): Secondary | ICD-10-CM | POA: Diagnosis not present

## 2023-05-28 DIAGNOSIS — C182 Malignant neoplasm of ascending colon: Secondary | ICD-10-CM | POA: Diagnosis not present

## 2023-05-28 DIAGNOSIS — I480 Paroxysmal atrial fibrillation: Secondary | ICD-10-CM | POA: Diagnosis not present

## 2023-05-28 DIAGNOSIS — J4489 Other specified chronic obstructive pulmonary disease: Secondary | ICD-10-CM | POA: Diagnosis not present

## 2023-05-28 DIAGNOSIS — D630 Anemia in neoplastic disease: Secondary | ICD-10-CM | POA: Diagnosis not present

## 2023-05-28 DIAGNOSIS — I5032 Chronic diastolic (congestive) heart failure: Secondary | ICD-10-CM | POA: Diagnosis not present

## 2023-05-28 DIAGNOSIS — I110 Hypertensive heart disease with heart failure: Secondary | ICD-10-CM | POA: Diagnosis not present

## 2023-05-28 DIAGNOSIS — J439 Emphysema, unspecified: Secondary | ICD-10-CM | POA: Diagnosis not present

## 2023-05-31 NOTE — Plan of Care (Signed)
[facility] Tonsillectomy/Adenoidectomy, Postoperative PEDS care plan entered in error.

## 2023-06-04 DIAGNOSIS — J439 Emphysema, unspecified: Secondary | ICD-10-CM | POA: Diagnosis not present

## 2023-06-04 DIAGNOSIS — D630 Anemia in neoplastic disease: Secondary | ICD-10-CM | POA: Diagnosis not present

## 2023-06-04 DIAGNOSIS — C182 Malignant neoplasm of ascending colon: Secondary | ICD-10-CM | POA: Diagnosis not present

## 2023-06-04 DIAGNOSIS — D500 Iron deficiency anemia secondary to blood loss (chronic): Secondary | ICD-10-CM | POA: Diagnosis not present

## 2023-06-04 DIAGNOSIS — Z483 Aftercare following surgery for neoplasm: Secondary | ICD-10-CM | POA: Diagnosis not present

## 2023-06-04 DIAGNOSIS — J4489 Other specified chronic obstructive pulmonary disease: Secondary | ICD-10-CM | POA: Diagnosis not present

## 2023-06-04 DIAGNOSIS — I480 Paroxysmal atrial fibrillation: Secondary | ICD-10-CM | POA: Diagnosis not present

## 2023-06-04 DIAGNOSIS — I5032 Chronic diastolic (congestive) heart failure: Secondary | ICD-10-CM | POA: Diagnosis not present

## 2023-06-04 DIAGNOSIS — I110 Hypertensive heart disease with heart failure: Secondary | ICD-10-CM | POA: Diagnosis not present

## 2023-06-07 ENCOUNTER — Ambulatory Visit: Payer: Medicare HMO | Attending: Internal Medicine | Admitting: Internal Medicine

## 2023-06-07 ENCOUNTER — Encounter: Payer: Self-pay | Admitting: Internal Medicine

## 2023-06-07 ENCOUNTER — Ambulatory Visit: Payer: Medicare HMO | Admitting: Family Medicine

## 2023-06-07 VITALS — BP 118/76 | HR 64 | Ht 66.0 in | Wt 202.0 lb

## 2023-06-07 DIAGNOSIS — I442 Atrioventricular block, complete: Secondary | ICD-10-CM

## 2023-06-07 DIAGNOSIS — Z0000 Encounter for general adult medical examination without abnormal findings: Secondary | ICD-10-CM | POA: Diagnosis not present

## 2023-06-07 NOTE — Progress Notes (Signed)
PATIENT CHECK-IN and HEALTH RISK ASSESSMENT QUESTIONNAIRE:  -completed by phone/video for upcoming Medicare Preventive Visit  Pre-Visit Check-in: 1)Vitals (height, wt, BP, etc) - record in vitals section for visit on day of visit. ** was seen with Cardiology today.  Request home vitals (wt, BP, etc.) and enter into vitals, THEN update Vital Signs SmartPhrase below at the top of the HPI. See below.  2)Review and Update Medications, Allergies PMH, Surgeries, Social history in Epic 3)Hospitalizations in the last year with date/reason? On 03/20/2023  for Colon cancer.  4)Review and Update Care Team (patient's specialists) in Epic 5) Complete PHQ9 in Epic  6) Complete Fall Screening in Epic 7)Review all Health Maintenance Due and order under PCP if not done.  8)Medicare Wellness Questionnaire: Answer theses question about your habits: Do you drink alcohol? No If yes, how many drinks do you have a day?N/a Have you ever smoked?yes  Quit date if applicable? 1979  How many packs a day do/did you smoke? Pt reports she did smoke 4-5 cigarettes a day.  Do you use smokeless tobacco?no Do you use an illicit drugs?no Do you exercises? Yes IF so, what type and how many days/minutes per week?walking for 30 minutes for 4 days a wk, PT once a week for about 30 mins.  Are you sexually active? No Number of partners? n/a Typical breakfast: cereal, egg, sausage, muffins Typical lunch: noodle soup, sandwiches, salad Typical dinner: baked chicken, broccoli, fish Typical snacks: cheese crackers  Beverages: apple juice, cranberry juice, sometimes milk  Answer theses question about you: Can you perform most household chores? yes Do you find it hard to follow a conversation in a noisy room? No  Do you often ask people to speak up or repeat themselves? No Do you feel that you have a problem with memory?No Do you balance your checkbook and or bank acounts?No Do you feel safe at home?Yes Last dentist visit?pt  reports she is trying to get one schedule. Last one was about a year ago.  Do you need assistance with any of the following: Please note if so    Driving? yes  Feeding yourself?  Getting from bed to chair?  Getting to the toilet?  Bathing or showering?  Dressing yourself?  Managing money?   Climbing a flight of stairs  Preparing meals? yes  Do you have Advanced Directives in place (Living Will, Healthcare Power or Attorney)? In the process   Last eye Exam and location? About a year and half ago in [place], [place]. Unable to recall who it was with.    Do you currently use prescribed or non-prescribed narcotic or opioid pain medications?No  Do you have a history or close family history of breast, ovarian, tubal or peritoneal cancer or a family member with BRCA (breast cancer susceptibility 1 and 2) gene mutations? Sister had a breast cancer  Request home vitals (wt, BP, etc.) and enter into vitals, THEN update Vital Signs SmartPhrase below at the top of the HPI. See below.   Nurse/Assistant Credentials/time stamp: Karpuih M./CMA/419pm   ----------------------------------------------------------------------------------------------------------------------------------------------------------------------------------------------------------------------  Because this visit was a virtual/telehealth visit, some criteria may be missing or patient reported. Any vitals not documented were not able to be obtained and vitals that have been documented are patient reported.    MEDICARE ANNUAL PREVENTIVE VISIT WITH PROVIDER: (Welcome to Medicare, initial annual wellness or annual wellness exam)  Virtual Visit via Phone Note  I connected with [name] on 06/07/23 by phone and verified that I  am speaking with the correct person using two identifiers.  Location patient: home Location provider:work or home office Persons participating in the virtual visit: patient, provider, oldest  son  Concerns and/or follow up today: stable, feels ok since home.    See HM section in Epic for other details of completed HM.    ROS: negative for report of fevers, unintentional weight loss, vision changes, vision loss, hearing loss or change, chest pain, sob, hemoptysis, melena, hematochezia, hematuria, falls, bleeding or bruising, thoughts of suicide or self harm, memory loss  Patient-completed extensive health risk assessment - reviewed and discussed with the patient: See Health Risk Assessment completed with patient prior to the visit either above or in recent phone note. This was reviewed in detailed with the patient today and appropriate recommendations, orders and referrals were placed as needed per Summary below and patient instructions.   Review of Medical History: -PMH, PSH, Family History and current specialty and care providers reviewed and updated and listed below   Patient Care Team: Philip Aspen, Limmie Patricia, MD as PCP - General (Internal Medicine) Marinus Maw, MD as PCP - Cardiology (Cardiology) Marinus Maw, MD as PCP - Electrophysiology (Cardiology) Bridgett Larsson, LCSW as Social Worker (Licensed Clinical Social Worker)   Past Medical History:  Diagnosis Date   Allergic rhinitis    Anemia    hx due to med   Arthritis    Asthma    CHF (congestive heart failure) (HCC)    Dyspnea    Emphysema of lung (HCC)    GERD (gastroesophageal reflux disease)    Hyperlipidemia    Hypertension    Pacemaker    PAT (paroxysmal atrial tachycardia) (HCC) 2008   Pneumonia    hx   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   rxd 3 yrs meds, no surgery in kidneys    Past Surgical History:  Procedure Laterality Date   ABDOMINAL HYSTERECTOMY     BIOPSY  03/23/2023   Procedure: BIOPSY;  Surgeon: Lynann Bologna, MD;  Location: [facility];  Service: Gastroenterology;;   BREAST BIOPSY  04/30/2012   Procedure: BREAST BIOPSY WITH NEEDLE LOCALIZATION;  Surgeon: Emelia Loron, MD;   Location: [facility];  Service: General;  Laterality: Left;  Left breast wire localization biospy   COLONOSCOPY WITH PROPOFOL N/A 03/23/2023   Procedure: COLONOSCOPY WITH PROPOFOL;  Surgeon: Lynann Bologna, MD;  Location: [facility];  Service: Gastroenterology;  Laterality: N/A;   ESOPHAGOGASTRODUODENOSCOPY (EGD) WITH PROPOFOL N/A 03/23/2023   Procedure: ESOPHAGOGASTRODUODENOSCOPY (EGD) WITH PROPOFOL;  Surgeon: Lynann Bologna, MD;  Location: [facility];  Service: Gastroenterology;  Laterality: N/A;   HEMORRHOID SURGERY     LAPAROTOMY N/A 03/26/2023   Procedure: EXPLORATORY LAPAROTOMY, RIGHT HEMICOLECTOMY; LYSIS OF ADHESIONS;  Surgeon: Manus Rudd, MD;  Location: [facility];  Service: General;  Laterality: N/A;   PACEMAKER INSERTION  02-06-2011   PPM GENERATOR CHANGEOUT N/A 04/19/2020   Procedure: PPM GENERATOR CHANGEOUT;  Surgeon: Marinus Maw, MD;  Location: [facility];  Service: Cardiovascular;  Laterality: N/A;   SUBMUCOSAL TATTOO INJECTION  03/23/2023   Procedure: SUBMUCOSAL TATTOO INJECTION;  Surgeon: Lynann Bologna, MD;  Location: [facility];  Service: Gastroenterology;;   TAH and BSO     TONSILLECTOMY      Social History   Socioeconomic History   Marital status: Divorced    Spouse name: Not on file   Number of children: 2   Years of education: Not on file   Highest education level: Not on  file  Occupational History   Occupation: cna  Tobacco Use   Smoking status: Former    Current packs/day: 0.00    Average packs/day: 1 pack/day for 4.0 years (4.0 ttl pk-yrs)    Types: Cigarettes    Start date: 08/07/1973    Quit date: 08/07/1977    Years since quitting: 45.8   Smokeless tobacco: Never  Vaping Use   Vaping status: Never Used  Substance and Sexual Activity   Alcohol use: No    Alcohol/week: 0.0 standard drinks of alcohol    Comment: quit 1979   Drug use: No   Sexual activity: Not on file  Other Topics Concern   Not on file  Social History Narrative   Pt has 11  siblings in all   Social Determinants of Health   Financial Resource Strain: Low Risk  (01/19/2022)   Overall Financial Resource Strain (CARDIA)    Difficulty of Paying Living Expenses: Not hard at all  Food Insecurity: No Food Insecurity (03/21/2023)   Hunger Vital Sign    Worried About Running Out of Food in the Last Year: Never true    Ran Out of Food in the Last Year: Never true  Transportation Needs: No Transportation Needs (03/21/2023)   PRAPARE - Administrator, Civil Service (Medical): No    Lack of Transportation (Non-Medical): No  Physical Activity: Not on file  Stress: No Stress Concern Present (01/19/2022)   [employer] of Occupational Health - Occupational Stress Questionnaire    Feeling of Stress : Not at all  Social Connections: Not on file  Intimate Partner Violence: Not At Risk (03/21/2023)   Humiliation, Afraid, Rape, and Kick questionnaire    Fear of Current or Ex-Partner: No    Emotionally Abused: No    Physically Abused: No    Sexually Abused: No    Family History  Problem Relation Age of Onset   Heart disease Mother    Cancer Sister        breast    Current Outpatient Medications on File Prior to Visit  Medication Sig Dispense Refill   acetaminophen (TYLENOL) 325 MG tablet Take 2 tablets (650 mg total) by mouth every 6 (six) hours as needed for mild pain (or Fever >/= 101).     albuterol (VENTOLIN HFA) 108 (90 Base) MCG/ACT inhaler Inhale 2 puffs into the lungs every 4 (four) hours as needed. 54 g 1   apixaban (ELIQUIS) 5 MG TABS tablet Take 1 tablet (5 mg total) by mouth 2 (two) times daily. 180 tablet 1   atorvastatin (LIPITOR) 20 MG tablet Take 1 tablet (20 mg total) by mouth daily. 90 tablet 1   bisacodyl (DULCOLAX) 5 MG EC tablet Take 1 tablet (5 mg total) by mouth daily as needed for moderate constipation.     budesonide-formoterol (SYMBICORT) 80-4.5 MCG/ACT inhaler INHALE 2 PUFFS INTO THE LUNGS 2 (TWO) TIMES DAILY. (Patient taking  differently: Inhale 2 puffs into the lungs 2 (two) times daily.) 3 each 0   docusate sodium (COLACE) 100 MG capsule Take 1 capsule (100 mg total) by mouth 2 (two) times daily.     feeding supplement (ENSURE SURGERY) LIQD Take 237 mLs by mouth 3 (three) times daily between meals.     furosemide (LASIX) 20 MG tablet Take 1 tablet (20 mg total) by mouth daily as needed for fluid or edema. 90 tablet 0   montelukast (SINGULAIR) 10 MG tablet TAKE 1 TABLET AT BEDTIME 90 tablet 1  omeprazole (PRILOSEC) 20 MG capsule TAKE 1 CAPSULE EVERY DAY 30 TO 60 MINUTES BEFORE FIRST MEAL OF THE DAY (Patient taking differently: Take 20 mg by mouth daily.) 90 capsule 1   No current facility-administered medications on file prior to visit.    Allergies  Allergen Reactions   Albuterol Palpitations and Other (See Comments)    Irregular heartbeat         Physical Exam Vitals requested from patient and listed below if patient had equipment and was able to obtain at home for this virtual visit: Vitals:   06/07/23 1605  BP: 118/76  Pulse: 64  SpO2: 98%   Estimated body mass index is 32.6 kg/m as calculated from the following:   Height as of this encounter: 5\' 6"  (1.676 m).   Weight as of this encounter: 202 lb (91.6 kg).  EKG (optional): deferred due to virtual visit  GENERAL: alert, oriented, no acute distress detected, full vision exam deferred due to pandemic and/or virtual encounter  PSYCH/NEURO: pleasant and cooperative, no obvious depression or anxiety, speech and thought processing grossly intact, Cognitive function grossly intact  Flowsheet Row Office Visit from 06/07/2023 in [facility] [place]  PHQ-9 Total Score 0           06/07/2023    4:06 PM 04/26/2023    3:50 PM 05/25/2022    9:58 AM 01/19/2022    1:26 PM 02/15/2021    4:49 PM  Depression screen PHQ 2/9  Decreased Interest 0 0 0 1 3  Down, Depressed, Hopeless 0 0 0 0 0  PHQ - 2 Score 0 0 0 1 3  Altered  sleeping 0 0 0  0  Tired, decreased energy 0 0 0  3  Change in appetite 0 1   0  Feeling bad or failure about yourself  0 0 0  0  Trouble concentrating 0 0 0  0  Moving slowly or fidgety/restless 0 0 0  0  Suicidal thoughts 0 0 0  0  PHQ-9 Score 0 1 0  6  Difficult doing work/chores  Somewhat difficult Not difficult at all         11/16/2021    9:55 AM 01/19/2022    1:25 PM 12/07/2022    2:18 PM 04/26/2023    3:50 PM 06/07/2023    4:06 PM  Fall Risk  Falls in the past year? 0 0 0 0 0  Was there an injury with Fall? 0 0 0 0 0  Fall Risk Category Calculator 0 0 0 0 0  Fall Risk Category (Retired) Low Low     (RETIRED) Patient Fall Risk Level Moderate fall risk Moderate fall risk     Patient at Risk for Falls Due to Impaired balance/gait;Impaired mobility;Impaired vision;Medication side effect History of fall(s);Impaired mobility   No Fall Risks  Fall risk Follow up Falls evaluation completed;Education provided Education provided;Falls prevention discussed Falls evaluation completed Falls evaluation completed Falls evaluation completed     SUMMARY AND PLAN:  Encounter for Medicare annual wellness exam   Discussed applicable health maintenance/preventive health measures and advised and referred or ordered per patient preferences: -discussed Tdap, covid and flu vaccines and advised per cdc -she declined dexa Health Maintenance  Topic Date Due   DTaP/Tdap/Td (1 - Tdap) Never done   INFLUENZA VACCINE  11/05/2023 (Originally 03/08/2023)   COVID-19 Vaccine (4 - 2023-24 season) 12/05/2023 (Originally 04/08/2023)   DEXA SCAN  06/06/2024 (Originally 11/12/2003)   Medicare Annual  Wellness (AWV)  06/06/2024   Pneumonia Vaccine 57+ Years old  Completed   Zoster Vaccines- Shingrix  Completed   HPV VACCINES  Aged [employer] and counseling on the following was provided based on the above review of health and a plan/checklist for the patient, along with additional information discussed, was  provided for the patient in the patient instructions :  -Advised on importance of completing advanced directives, discussed options for completing and provided information in patient instructions as well -Advised and counseled on a healthy lifestyle - including the importance of a healthy diet, regular physical activity, social connections -Reviewed patient's current diet. Advised and counseled on a whole foods based healthy diet. A summary of a healthy diet was provided in the Patient Instructions.  -reviewed patient's current physical activity level and discussed exercise guidelines for adults. Discussed community resources and ideas for safe exercise at home to assist in meeting exercise guideline recommendations in a safe and healthy way.  -Advise yearly dental visits at minimum and regular eye exams   Follow up: see patient instructions     Patient Instructions  I really enjoyed getting to talk with you today! I am available on Tuesdays and Thursdays for virtual visits if you have any questions or concerns, or if I can be of any further assistance.   CHECKLIST FROM ANNUAL WELLNESS VISIT:  -Follow up (please call to schedule if not scheduled after visit):   -yearly for annual wellness visit with primary care office  Here is a list of your preventive care/health maintenance measures and the plan for each if any are due:  PLAN For any measures below that may be due:  -can get the flu, covid and Tdap vaccines at the pharmacy, let us know when you do so that we can update your record.   Health Maintenance  Topic Date Due   DTaP/Tdap/Td (1 - Tdap) Never done   INFLUENZA VACCINE  11/05/2023 (Originally 03/08/2023)   COVID-19 Vaccine (4 - 2023-24 season) 12/05/2023 (Originally 04/08/2023)   DEXA SCAN  06/06/2024 (Originally 11/12/2003)   Medicare Annual Wellness (AWV)  06/06/2024   Pneumonia Vaccine 66+ Years old  Completed   Zoster Vaccines- Shingrix  Completed   HPV VACCINES  Aged Out     -See a dentist at least yearly  -Get your eyes checked and then per your eye specialist's recommendations  -Other issues addressed today:   -I have included below further information regarding a healthy whole foods based diet, physical activity guidelines for adults, stress management and opportunities for social connections. I hope you find this information useful.   -----------------------------------------------------------------------------------------------------------------------------------------------------------------------------------------------------------------------------------------------------------  NUTRITION: -eat real food: lots of colorful vegetables (half the plate) and fruits -5-7 servings of vegetables and fruits per day (fresh or steamed is best), exp. 2 servings of vegetables with lunch and dinner and 2 servings of fruit per day. Berries and greens such as kale and collards are great choices.  -consume on a regular basis: whole grains (make sure first ingredient on label contains the word "whole"), fresh fruits, fish, nuts, seeds, healthy oils (such as olive oil, avocado oil, grape seed oil) -may eat small amounts of dairy and lean meat on occasion, but avoid processed meats such as ham, bacon, lunch meat, etc. -drink water -try to avoid fast food and pre-packaged foods, processed meat -most experts advise limiting sodium to < 2300mg  per day, should limit further is any chronic conditions such as high blood pressure, heart disease, diabetes, etc.  The American Heart Association advised that < 1500mg  is is ideal -try to avoid foods that contain any ingredients with names you do not recognize  -try to avoid sugar/sweets (except for the natural sugar that occurs in fresh fruit) -try to avoid sweet drinks -try to avoid white rice, white bread, pasta (unless whole grain), white or yellow potatoes  EXERCISE GUIDELINES FOR ADULTS: -if you wish to increase your physical  activity, do so gradually and with the approval of your doctor -STOP and seek medical care immediately if you have any chest pain, chest discomfort or trouble breathing when starting or increasing exercise  -move and stretch your body, legs, feet and arms when sitting for long periods -Physical activity guidelines for optimal health in adults: -least 150 minutes per week of aerobic exercise (can talk, but not sing) once approved by your doctor, 20-30 minutes of sustained activity or two 10 minute episodes of sustained activity every day.  -resistance training at least 2 days per week if approved by your doctor -balance exercises 3+ days per week:   Stand somewhere where you have something sturdy to hold onto if you lose balance.    1) lift up on toes, start with 5x per day and work up to 20x   2) stand and lift on leg straight out to the side so that foot is a few inches of the floor, start with 5x each side and work up to 20x each side   3) stand on one foot, start with 5 seconds each side and work up to 20 seconds on each side  If you need ideas or help with getting more active:  -Silver sneakers https://tools.silversneakers.com  -Walk with a Doc: http://www.duncan-williams.com/  -try to include resistance (weight lifting/strength building) and balance exercises twice per week: or the following link for ideas: http://castillo-powell.com/  BuyDucts.dk  STRESS MANAGEMENT: -can try meditating, or just sitting quietly with deep breathing while intentionally relaxing all parts of your body for 5 minutes daily -if you need further help with stress, anxiety or depression please follow up with your primary doctor or contact the wonderful folks at [facility]: [phone]  SOCIAL CONNECTIONS: -options in [place] if you wish to engage in more social and exercise related activities:  -Silver  sneakers https://tools.silversneakers.com  -Walk with a Doc: http://www.duncan-williams.com/  -Check out the [facility] Active Adults 50+ section on the [place] [employer] (hiking clubs, book clubs, cards and games, chess, exercise classes, aquatic classes and much more) - see the website for details: https://www.[place].gov/departments/parks-recreation/active-adults50  -YouTube has lots of exercise videos for different ages and abilities as well  -[facility] (a variety of indoor and outdoor inperson activities for adults). [phone]. [address]  -Virtual Online Classes (a variety of topics): see seniorplanet.org or call [phone]  -consider volunteering at a school, hospice center, church, senior center or elsewhere         ADVANCED HEALTHCARE DIRECTIVES:  [place] Advanced Directives assistance:   ExpressWeek.com.cy  Everyone should have advanced health care directives in place. This is so that you get the care you want, should you ever be in a situation where you are unable to make your own medical decisions.   From the [place] Advanced Directive Website: "Advance Health Care Directives are legal documents in which you give written instructions about your health care if, in the future, you cannot speak for yourself.   A health care power of attorney allows you to name a person you trust  to make your health care decisions if you cannot make them yourself. A declaration of a desire for a natural death (or living will) is document, which states that you desire not to have your life prolonged by extraordinary measures if you have a terminal or incurable illness or if you are in a vegetative state. An advance instruction for mental health treatment makes a declaration of instructions, information and preferences regarding your mental health treatment. It also states that you are aware that the  advance instruction authorizes a mental health treatment provider to act according to your wishes. It may also outline your consent or refusal of mental health treatment. A declaration of an anatomical gift allows anyone over the age of 9 to make a gift by will, organ donor card or other document."   Please see the following website or an elder law attorney for forms, FAQs and for completion of advanced directives: [place] TEFL teacher Health Care Directives Advance Health Care Directives (http://guzman.com/)  Or copy and paste the following to your web browser: PoshChat.fi    Terressa Koyanagi, DO

## 2023-06-07 NOTE — Patient Instructions (Signed)

## 2023-06-07 NOTE — Patient Instructions (Addendum)
I really enjoyed getting to talk with you today! I am available on Tuesdays and Thursdays for virtual visits if you have any questions or concerns, or if I can be of any further assistance.   CHECKLIST FROM ANNUAL WELLNESS VISIT:  -Follow up (please call to schedule if not scheduled after visit):   -yearly for annual wellness visit with primary care office  Here is a list of your preventive care/health maintenance measures and the plan for each if any are due:  PLAN For any measures below that may be due:  -can get the flu, covid and Tdap vaccines at the pharmacy, let us know when you do so that we can update your record.   Health Maintenance  Topic Date Due   DTaP/Tdap/Td (1 - Tdap) Never done   INFLUENZA VACCINE  11/05/2023 (Originally 03/08/2023)   COVID-19 Vaccine (4 - 2023-24 season) 12/05/2023 (Originally 04/08/2023)   DEXA SCAN  06/06/2024 (Originally 11/12/2003)   Medicare Annual Wellness (AWV)  06/06/2024   Pneumonia Vaccine 31+ Years old  Completed   Zoster Vaccines- Shingrix  Completed   HPV VACCINES  Aged Out    -See a dentist at least yearly  -Get your eyes checked and then per your eye specialist's recommendations  -Other issues addressed today:   -I have included below further information regarding a healthy whole foods based diet, physical activity guidelines for adults, stress management and opportunities for social connections. I hope you find this information useful.   -----------------------------------------------------------------------------------------------------------------------------------------------------------------------------------------------------------------------------------------------------------  NUTRITION: -eat real food: lots of colorful vegetables (half the plate) and fruits -5-7 servings of vegetables and fruits per day (fresh or steamed is best), exp. 2 servings of vegetables with lunch and dinner and 2 servings of fruit per day. Berries  and greens such as kale and collards are great choices.  -consume on a regular basis: whole grains (make sure first ingredient on label contains the word "whole"), fresh fruits, fish, nuts, seeds, healthy oils (such as olive oil, avocado oil, grape seed oil) -may eat small amounts of dairy and lean meat on occasion, but avoid processed meats such as ham, bacon, lunch meat, etc. -drink water -try to avoid fast food and pre-packaged foods, processed meat -most experts advise limiting sodium to < 2300mg  per day, should limit further is any chronic conditions such as high blood pressure, heart disease, diabetes, etc. The American Heart Association advised that < 1500mg  is is ideal -try to avoid foods that contain any ingredients with names you do not recognize  -try to avoid sugar/sweets (except for the natural sugar that occurs in fresh fruit) -try to avoid sweet drinks -try to avoid white rice, white bread, pasta (unless whole grain), white or yellow potatoes  EXERCISE GUIDELINES FOR ADULTS: -if you wish to increase your physical activity, do so gradually and with the approval of your doctor -STOP and seek medical care immediately if you have any chest pain, chest discomfort or trouble breathing when starting or increasing exercise  -move and stretch your body, legs, feet and arms when sitting for long periods -Physical activity guidelines for optimal health in adults: -least 150 minutes per week of aerobic exercise (can talk, but not sing) once approved by your doctor, 20-30 minutes of sustained activity or two 10 minute episodes of sustained activity every day.  -resistance training at least 2 days per week if approved by your doctor -balance exercises 3+ days per week:   Stand somewhere where you have something sturdy to hold onto if  you lose balance.    1) lift up on toes, start with 5x per day and work up to 20x   2) stand and lift on leg straight out to the side so that foot is a few inches  of the floor, start with 5x each side and work up to 20x each side   3) stand on one foot, start with 5 seconds each side and work up to 20 seconds on each side  If you need ideas or help with getting more active:  -Silver sneakers https://tools.silversneakers.com  -Walk with a Doc: http://www.duncan-williams.com/  -try to include resistance (weight lifting/strength building) and balance exercises twice per week: or the following link for ideas: http://castillo-powell.com/  BuyDucts.dk  STRESS MANAGEMENT: -can try meditating, or just sitting quietly with deep breathing while intentionally relaxing all parts of your body for 5 minutes daily -if you need further help with stress, anxiety or depression please follow up with your primary doctor or contact the wonderful folks at [facility]: [phone]  SOCIAL CONNECTIONS: -options in [place] if you wish to engage in more social and exercise related activities:  -Silver sneakers https://tools.silversneakers.com  -Walk with a Doc: http://www.duncan-williams.com/  -Check out the [facility] Active Adults 50+ section on the [place] of [employer] (hiking clubs, book clubs, cards and games, chess, exercise classes, aquatic classes and much more) - see the website for details: https://www.[place].gov/departments/parks-recreation/active-adults50  -YouTube has lots of exercise videos for different ages and abilities as well  -[facility] (a variety of indoor and outdoor inperson activities for adults). [phone]. [address].  -Virtual Online Classes (a variety of topics): see seniorplanet.org or call [phone]  -consider volunteering at a school, hospice center, church, senior center or elsewhere         ADVANCED HEALTHCARE DIRECTIVES:  [place] Advanced Directives  assistance:   ExpressWeek.com.cy  Everyone should have advanced health care directives in place. This is so that you get the care you want, should you ever be in a situation where you are unable to make your own medical decisions.   From the [place] Advanced Directive Website: "Advance Health Care Directives are legal documents in which you give written instructions about your health care if, in the future, you cannot speak for yourself.   A health care power of attorney allows you to name a person you trust to make your health care decisions if you cannot make them yourself. A declaration of a desire for a natural death (or living will) is document, which states that you desire not to have your life prolonged by extraordinary measures if you have a terminal or incurable illness or if you are in a vegetative state. An advance instruction for mental health treatment makes a declaration of instructions, information and preferences regarding your mental health treatment. It also states that you are aware that the advance instruction authorizes a mental health treatment provider to act according to your wishes. It may also outline your consent or refusal of mental health treatment. A declaration of an anatomical gift allows anyone over the age of 58 to make a gift by will, organ donor card or other document."   Please see the following website or an elder law attorney for forms, FAQs and for completion of advanced directives: [place] Health Care Directives Advance Health Care Directives (http://guzman.com/)  Or copy and paste the following to your web browser: PoshChat.fi

## 2023-06-07 NOTE — Progress Notes (Signed)
HPI [name] returns today for followup. She is a pleasant 82 yo woman with a h/o HTN, CHB, s/p PPM insertion. She has morbid obesity. She has undergone PPM gen change out. She denies chest pain or sob. She has lost over 60 lbs. She admits to dietary indiscretion. She was found to have colon CA and underwent surgical resection about 3 months ago. She has recovered. Allergies  Allergen Reactions   Albuterol Palpitations and Other (See Comments)    Irregular heartbeat       Current Outpatient Medications  Medication Sig Dispense Refill   acetaminophen (TYLENOL) 325 MG tablet Take 2 tablets (650 mg total) by mouth every 6 (six) hours as needed for mild pain (or Fever >/= 101).     albuterol (VENTOLIN HFA) 108 (90 Base) MCG/ACT inhaler Inhale 2 puffs into the lungs every 4 (four) hours as needed. 54 g 1   apixaban (ELIQUIS) 5 MG TABS tablet Take 1 tablet (5 mg total) by mouth 2 (two) times daily. 180 tablet 1   atorvastatin (LIPITOR) 20 MG tablet Take 1 tablet (20 mg total) by mouth daily. 90 tablet 1   bisacodyl (DULCOLAX) 5 MG EC tablet Take 1 tablet (5 mg total) by mouth daily as needed for moderate constipation.     budesonide-formoterol (SYMBICORT) 80-4.5 MCG/ACT inhaler INHALE 2 PUFFS INTO THE LUNGS 2 (TWO) TIMES DAILY. (Patient taking differently: Inhale 2 puffs into the lungs 2 (two) times daily.) 3 each 0   docusate sodium (COLACE) 100 MG capsule Take 1 capsule (100 mg total) by mouth 2 (two) times daily.     feeding supplement (ENSURE SURGERY) LIQD Take 237 mLs by mouth 3 (three) times daily between meals.     furosemide (LASIX) 20 MG tablet Take 1 tablet (20 mg total) by mouth daily as needed for fluid or edema. 90 tablet 0   montelukast (SINGULAIR) 10 MG tablet TAKE 1 TABLET AT BEDTIME 90 tablet 1   omeprazole (PRILOSEC) 20 MG capsule TAKE 1 CAPSULE EVERY DAY 30 TO 60 MINUTES BEFORE FIRST MEAL OF THE DAY (Patient taking differently: Take 20 mg by mouth daily.) 90 capsule 1    No current facility-administered medications for this visit.     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   Past Surgical History:  Procedure Laterality Date   ABDOMINAL HYSTERECTOMY     BIOPSY  03/23/2023   Procedure: BIOPSY;  Surgeon: Lynann Bologna, MD;  Location: [facility];  Service: Gastroenterology;;   BREAST BIOPSY  04/30/2012   Procedure: BREAST BIOPSY WITH NEEDLE LOCALIZATION;  Surgeon: Emelia Loron, MD;  [facility];  Service: General;  Laterality: Left;  Left breast wire localization biospy   COLONOSCOPY WITH PROPOFOL N/A 03/23/2023   Procedure: COLONOSCOPY WITH PROPOFOL;  Surgeon: Lynann Bologna, MD;  Location: [facility];  Service: Gastroenterology;  Laterality:  N/A;   ESOPHAGOGASTRODUODENOSCOPY (EGD) WITH PROPOFOL N/A 03/23/2023   Procedure: ESOPHAGOGASTRODUODENOSCOPY (EGD) WITH PROPOFOL;  Surgeon: Lynann Bologna, MD;  Location: [facility];  Service: Gastroenterology;  Laterality: N/A;   HEMORRHOID SURGERY     LAPAROTOMY N/A 03/26/2023   Procedure: EXPLORATORY LAPAROTOMY, RIGHT HEMICOLECTOMY; LYSIS OF ADHESIONS;  Surgeon: Manus Rudd, MD;  Location: [facility];  Service: General;  Laterality: N/A;   PACEMAKER INSERTION  02-06-2011   PPM GENERATOR CHANGEOUT N/A 04/19/2020   Procedure: PPM GENERATOR CHANGEOUT;  Surgeon: Marinus Maw, MD;  Location: [facility];  Service: Cardiovascular;  Laterality: N/A;   SUBMUCOSAL TATTOO INJECTION  03/23/2023   Procedure: SUBMUCOSAL TATTOO INJECTION;  Surgeon: Lynann Bologna,  MD;  Location: [facility];  Service: Gastroenterology;;   TAH and BSO     TONSILLECTOMY       Family History  Problem Relation Age of Onset   Heart disease Mother    Cancer Sister        breast     Social History   Socioeconomic History   Marital status: Divorced    Spouse name: Not on file   Number of children: 2   Years of education: Not on file   Highest education level: Not on file  Occupational History   Occupation: cna  Tobacco Use   Smoking status: Former    Current packs/day: 0.00    Average packs/day: 1 pack/day for 4.0 years (4.0 ttl pk-yrs)    Types: Cigarettes    Start date: 08/07/1973    Quit date: 08/07/1977    Years since quitting: 45.8   Smokeless tobacco: Never  Vaping Use   Vaping status: Never Used  Substance and Sexual Activity   Alcohol use: No    Alcohol/week: 0.0 standard drinks of alcohol    Comment: quit 1979   Drug use: No   Sexual activity: Not on file  Other Topics Concern   Not on file  Social History Narrative   Pt has 11 siblings in all   Social Determinants of Health   Financial Resource Strain: Low Risk  (01/19/2022)   Overall Financial Resource Strain (CARDIA)    Difficulty of Paying Living Expenses: Not hard at all  Food Insecurity: No Food Insecurity (03/21/2023)   Hunger Vital Sign    Worried About Running Out of Food in the Last Year: Never true    Ran Out of Food in the Last Year: Never true  Transportation Needs: No Transportation Needs (03/21/2023)   PRAPARE - Administrator, Civil Service (Medical): No    Lack of Transportation (Non-Medical): No  Physical Activity: Not on file  Stress: No Stress Concern Present (01/19/2022)   [employer] of Occupational Health - Occupational Stress Questionnaire    Feeling of Stress : Not at all  Social Connections: Not on file  Intimate Partner Violence: Not At Risk (03/21/2023)   Humiliation, Afraid, Rape, and Kick questionnaire    Fear of Current or Ex-Partner: No     Emotionally Abused: No    Physically Abused: No    Sexually Abused: No     BP 118/76 (BP Location: Left Arm, Patient Position: Sitting, Cuff Size: Normal)   Pulse 64   Ht 5\' 6"  (1.676 m)   Wt 202 lb (91.6 kg)   SpO2 98%   BMI 32.60 kg/m   Physical Exam:  Well appearing NAD HEENT: Unremarkable Neck:  No JVD, no thyromegally Lymphatics:  No adenopathy Back:  No CVA  tenderness Lungs:  Clear HEART:  Regular rate rhythm, no murmurs, no rubs, no clicks Abd:  soft, positive bowel sounds, no organomegally, no rebound, no guarding Ext:  2 plus pulses, no edema, no cyanosis, no clubbing Skin:  No rashes no nodules Neuro:  CN II through XII intact, motor grossly intact  DEVICE  Normal device function.  See PaceArt for details.   Assess/Plan:  1.CHB - she is asymptomatic, s/p PPM insertion. 2. Chronic systolic heart failure - she has some peripheral edema and I have encouraged her to avoid salty foods. 3. Obesity - I encouraged her to lose 10 lbs in the next year. 4. HTN - her bp is well controlled. We will follow.   Sharlot Gowda Anabela Crayton,MD

## 2023-06-11 DIAGNOSIS — C182 Malignant neoplasm of ascending colon: Secondary | ICD-10-CM | POA: Diagnosis not present

## 2023-06-11 DIAGNOSIS — I5032 Chronic diastolic (congestive) heart failure: Secondary | ICD-10-CM | POA: Diagnosis not present

## 2023-06-11 DIAGNOSIS — Z483 Aftercare following surgery for neoplasm: Secondary | ICD-10-CM | POA: Diagnosis not present

## 2023-06-11 DIAGNOSIS — I110 Hypertensive heart disease with heart failure: Secondary | ICD-10-CM | POA: Diagnosis not present

## 2023-06-11 DIAGNOSIS — J439 Emphysema, unspecified: Secondary | ICD-10-CM | POA: Diagnosis not present

## 2023-06-11 DIAGNOSIS — J4489 Other specified chronic obstructive pulmonary disease: Secondary | ICD-10-CM | POA: Diagnosis not present

## 2023-06-11 DIAGNOSIS — D500 Iron deficiency anemia secondary to blood loss (chronic): Secondary | ICD-10-CM | POA: Diagnosis not present

## 2023-06-11 DIAGNOSIS — D630 Anemia in neoplastic disease: Secondary | ICD-10-CM | POA: Diagnosis not present

## 2023-06-11 DIAGNOSIS — I480 Paroxysmal atrial fibrillation: Secondary | ICD-10-CM | POA: Diagnosis not present

## 2023-06-20 ENCOUNTER — Other Ambulatory Visit: Payer: Self-pay | Admitting: Internal Medicine

## 2023-06-20 DIAGNOSIS — J4530 Mild persistent asthma, uncomplicated: Secondary | ICD-10-CM

## 2023-06-21 DIAGNOSIS — I110 Hypertensive heart disease with heart failure: Secondary | ICD-10-CM | POA: Diagnosis not present

## 2023-06-21 DIAGNOSIS — D500 Iron deficiency anemia secondary to blood loss (chronic): Secondary | ICD-10-CM | POA: Diagnosis not present

## 2023-06-21 DIAGNOSIS — D630 Anemia in neoplastic disease: Secondary | ICD-10-CM | POA: Diagnosis not present

## 2023-06-21 DIAGNOSIS — J439 Emphysema, unspecified: Secondary | ICD-10-CM | POA: Diagnosis not present

## 2023-06-21 DIAGNOSIS — J4489 Other specified chronic obstructive pulmonary disease: Secondary | ICD-10-CM | POA: Diagnosis not present

## 2023-06-21 DIAGNOSIS — I5032 Chronic diastolic (congestive) heart failure: Secondary | ICD-10-CM | POA: Diagnosis not present

## 2023-06-21 DIAGNOSIS — C182 Malignant neoplasm of ascending colon: Secondary | ICD-10-CM | POA: Diagnosis not present

## 2023-06-21 DIAGNOSIS — Z483 Aftercare following surgery for neoplasm: Secondary | ICD-10-CM | POA: Diagnosis not present

## 2023-06-21 DIAGNOSIS — I480 Paroxysmal atrial fibrillation: Secondary | ICD-10-CM | POA: Diagnosis not present

## 2023-06-22 ENCOUNTER — Encounter: Payer: Self-pay | Admitting: Genetic Counselor

## 2023-06-26 ENCOUNTER — Encounter: Payer: Self-pay | Admitting: Genetic Counselor

## 2023-06-26 ENCOUNTER — Other Ambulatory Visit: Payer: Self-pay | Admitting: Genetic Counselor

## 2023-06-26 ENCOUNTER — Other Ambulatory Visit: Payer: Self-pay

## 2023-06-26 ENCOUNTER — Inpatient Hospital Stay: Payer: Medicare HMO | Attending: Nurse Practitioner | Admitting: Genetic Counselor

## 2023-06-26 ENCOUNTER — Inpatient Hospital Stay: Payer: Medicare HMO | Attending: Nurse Practitioner

## 2023-06-26 DIAGNOSIS — C182 Malignant neoplasm of ascending colon: Secondary | ICD-10-CM

## 2023-06-26 DIAGNOSIS — C184 Malignant neoplasm of transverse colon: Secondary | ICD-10-CM | POA: Diagnosis not present

## 2023-06-26 DIAGNOSIS — Z8042 Family history of malignant neoplasm of prostate: Secondary | ICD-10-CM | POA: Diagnosis not present

## 2023-06-26 DIAGNOSIS — Z803 Family history of malignant neoplasm of breast: Secondary | ICD-10-CM | POA: Diagnosis not present

## 2023-06-26 LAB — IRON AND IRON BINDING CAPACITY (CC-WL,HP ONLY)
Iron: 10 ug/dL — ABNORMAL LOW (ref 28–170)
Saturation Ratios: 3 % — ABNORMAL LOW (ref 10.4–31.8)
TIBC: 323 ug/dL (ref 250–450)
UIBC: 313 ug/dL (ref 148–442)

## 2023-06-26 LAB — CMP (CANCER CENTER ONLY)
ALT: 12 U/L (ref 0–44)
AST: 22 U/L (ref 15–41)
Albumin: 2.9 g/dL — ABNORMAL LOW (ref 3.5–5.0)
Alkaline Phosphatase: 145 U/L — ABNORMAL HIGH (ref 38–126)
Anion gap: 6 (ref 5–15)
BUN: 10 mg/dL (ref 8–23)
CO2: 29 mmol/L (ref 22–32)
Calcium: 8.3 mg/dL — ABNORMAL LOW (ref 8.9–10.3)
Chloride: 108 mmol/L (ref 98–111)
Creatinine: 0.99 mg/dL (ref 0.44–1.00)
GFR, Estimated: 56 mL/min — ABNORMAL LOW (ref >60)
Glucose, Bld: 98 mg/dL (ref 70–99)
Potassium: 3.7 mmol/L (ref 3.5–5.1)
Sodium: 143 mmol/L (ref 135–145)
Total Bilirubin: 0.9 mg/dL (ref <1.2)
Total Protein: 6 g/dL — ABNORMAL LOW (ref 6.5–8.1)

## 2023-06-26 LAB — CBC WITH DIFFERENTIAL (CANCER CENTER ONLY)
Abs Immature Granulocytes: 0.02 10*3/uL (ref 0.00–0.07)
Basophils Absolute: 0 10*3/uL (ref 0.0–0.1)
Basophils Relative: 1 %
Eosinophils Absolute: 0.1 10*3/uL (ref 0.0–0.5)
Eosinophils Relative: 2 %
HCT: 31.5 % — ABNORMAL LOW (ref 36.0–46.0)
Hemoglobin: 9.5 g/dL — ABNORMAL LOW (ref 12.0–15.0)
Immature Granulocytes: 0 %
Lymphocytes Relative: 21 %
Lymphs Abs: 1.3 10*3/uL (ref 0.7–4.0)
MCH: 23.8 pg — ABNORMAL LOW (ref 26.0–34.0)
MCHC: 30.2 g/dL (ref 30.0–36.0)
MCV: 78.8 fL — ABNORMAL LOW (ref 80.0–100.0)
Monocytes Absolute: 0.8 10*3/uL (ref 0.1–1.0)
Monocytes Relative: 13 %
Neutro Abs: 3.9 10*3/uL (ref 1.7–7.7)
Neutrophils Relative %: 63 %
Platelet Count: 278 10*3/uL (ref 150–400)
RBC: 4 MIL/uL (ref 3.87–5.11)
RDW: 23.3 % — ABNORMAL HIGH (ref 11.5–15.5)
WBC Count: 6.1 10*3/uL (ref 4.0–10.5)
nRBC: 0 % (ref 0.0–0.2)

## 2023-06-26 LAB — FERRITIN: Ferritin: 12 ng/mL (ref 11–307)

## 2023-06-26 LAB — GENETIC SCREENING ORDER

## 2023-06-26 LAB — CEA (ACCESS): CEA (CHCC): 4.49 ng/mL (ref 0.00–5.00)

## 2023-06-26 NOTE — Progress Notes (Signed)
REFERRING PROVIDER: Philip Aspen, Limmie Patricia, MD [address] [place],  [place] [address]  PRIMARY PROVIDER:  Philip Aspen, Limmie Patricia, MD  PRIMARY REASON FOR VISIT:  1. Family history of breast cancer   2. Family history of prostate cancer   3. Malignant neoplasm of ascending colon (HCC)      HISTORY OF PRESENT ILLNESS:   [name], a 82 y.o. female, was seen for a [facility] cancer genetics consultation at the request of Dr. Philip Aspen due to a personal and family history of cancer.  [name] presents to clinic today to discuss the possibility of a hereditary predisposition to cancer, genetic testing, and to further clarify her future cancer risks, as well as potential cancer risks for family members.   In October 2024, at the age of 72, [name] was diagnosed with cancer of the ascending colon. The treatment plan included surgery.     CANCER HISTORY:  Oncology History   No history exists.     RISK FACTORS:  Menarche was at age 39.  First live birth at age 78.  OCP use for approximately 1 years.  Ovaries intact: yes.  Hysterectomy: yes.  Menopausal status: postmenopausal.  HRT use: 0 years. Colonoscopy: yes; abnormal. Mammogram within the last year: yes. Number of breast biopsies: 0. Up to date with pelvic exams: n/a. 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   rxd 3 yrs meds, no surgery in kidneys    Past Surgical History:  Procedure Laterality Date   ABDOMINAL HYSTERECTOMY     BIOPSY  03/23/2023    Procedure: BIOPSY;  Surgeon: Lynann Bologna, MD;  Location: [facility];  Service: Gastroenterology;;   BREAST BIOPSY  04/30/2012   Procedure: BREAST BIOPSY WITH NEEDLE LOCALIZATION;  Surgeon: Emelia Loron, MD;  Location: [facility];  Service: General;  Laterality: Left;  Left breast wire localization biospy   COLONOSCOPY WITH PROPOFOL N/A 03/23/2023   Procedure: COLONOSCOPY WITH PROPOFOL;  Surgeon: Lynann Bologna, MD;  Location: [facility];  Service: Gastroenterology;  Laterality: N/A;   ESOPHAGOGASTRODUODENOSCOPY (EGD) WITH PROPOFOL N/A 03/23/2023   Procedure: ESOPHAGOGASTRODUODENOSCOPY (EGD) WITH PROPOFOL;  Surgeon: Lynann Bologna, MD;  Location: [facility];  Service: Gastroenterology;  Laterality: N/A;   HEMORRHOID SURGERY     LAPAROTOMY N/A 03/26/2023   Procedure: EXPLORATORY LAPAROTOMY, RIGHT HEMICOLECTOMY; LYSIS OF ADHESIONS;  Surgeon: Manus Rudd, MD;  Location: [facility];  Service: General;  Laterality: N/A;   PACEMAKER INSERTION  02-06-2011   PPM GENERATOR CHANGEOUT N/A 04/19/2020   Procedure: PPM GENERATOR CHANGEOUT;  Surgeon: Marinus Maw, MD;  Location: [facility];  Service: Cardiovascular;  Laterality: N/A;   SUBMUCOSAL TATTOO INJECTION  03/23/2023   Procedure: SUBMUCOSAL TATTOO INJECTION;  Surgeon: Lynann Bologna, MD;  Location: [facility];  Service: Gastroenterology;;   TAH and BSO     TONSILLECTOMY  Social History   Socioeconomic History   Marital status: Divorced    Spouse name: Not on file   Number of children: 2   Years of education: Not on file   Highest education level: Not on file  Occupational History   Occupation: cna  Tobacco Use   Smoking status: Former    Current packs/day: 0.00    Average packs/day: 1 pack/day for 4.0 years (4.0 ttl pk-yrs)    Types: Cigarettes    Start date: 08/07/1973    Quit date: 08/07/1977    Years since quitting: 45.9   Smokeless tobacco: Never  Vaping Use   Vaping status: Never Used  Substance and Sexual Activity    Alcohol use: No    Alcohol/week: 0.0 standard drinks of alcohol    Comment: quit 1979   Drug use: No   Sexual activity: Not on file  Other Topics Concern   Not on file  Social History Narrative   Pt has 11 siblings in all   Social Determinants of Health   Financial Resource Strain: Low Risk  (01/19/2022)   Overall Financial Resource Strain (CARDIA)    Difficulty of Paying Living Expenses: Not hard at all  Food Insecurity: No Food Insecurity (03/21/2023)   Hunger Vital Sign    Worried About Running Out of Food in the Last Year: Never true    Ran Out of Food in the Last Year: Never true  Transportation Needs: No Transportation Needs (03/21/2023)   PRAPARE - Administrator, Civil Service (Medical): No    Lack of Transportation (Non-Medical): No  Physical Activity: Not on file  Stress: No Stress Concern Present (01/19/2022)   [employer] of Occupational Health - Occupational Stress Questionnaire    Feeling of Stress : Not at all  Social Connections: Not on file     FAMILY HISTORY:  We obtained a detailed, 4-generation family history.  Significant diagnoses are listed below: Family History  Problem Relation Age of Onset   Heart disease Mother    Breast cancer Mother    Breast cancer Sister    Prostate cancer Brother    Prostate cancer Son 66     The patient has two sons, one son has prostate cancer.  The patient has one sister and 9 brothers, one was still born.  One brother has prostate cancer and her sister was diagnosed with breast cancer.  Both parents are deceased.  The patient's mother was diagnosed with breast cancer over the age of 73.  Her family was spread out and the patient is not as familiar with all relatives, but she is unaware of other cancers.    The patient's father died of a heart attack.  He had multiple siblings who are not known to have cancer.  There is no additional cancer diagnosis known on this side of the family.  [name] is unaware  of previous family history of genetic testing for hereditary cancer risks.  There is no reported Ashkenazi Jewish ancestry. There is no known consanguinity.  GENETIC COUNSELING ASSESSMENT: [name] is a 82 y.o. female with a personal and family history of cancer which is somewhat suggestive of a hereditary cancer syndrome and predisposition to cancer given the combination of cancer. We, therefore, discussed and recommended the following at today's visit.   DISCUSSION: We discussed that, in general, most cancer is not inherited in families, but instead is sporadic or familial. Sporadic cancers occur by chance and typically happen at older  ages (>50 years) as this type of cancer is caused by genetic changes acquired during an individual's lifetime. Some families have more cancers than would be expected by chance; however, the ages or types of cancer are not consistent with a known genetic mutation or known genetic mutations have been ruled out. This type of familial cancer is thought to be due to a combination of multiple genetic, environmental, hormonal, and lifestyle factors. While this combination of factors likely increases the risk of cancer, the exact source of this risk is not currently identifiable or testable.  We discussed that 5 - 10% of colon cancer is hereditary, with most cases associated with Lynch syndrome.  There are other genes that can be associated with hereditary colon cancer syndromes.  These include APC, MUTYH and BMPR1A, although these are typically associated with polyposis syndromes.  Based on [name]' family history, we are more concerned about a hereditary breast cancer syndrome based on two family members with breast cancer and two with prostate cancer.  While colon cancer is not a typical cancer for hereditary breast cancer syndromes, there are some families that have cancers that are less typical reported.  Recent NCCN Colorectal 3.2024 cancer guidelines recommend consideration of  genetic testing on all colon cancers.  We discussed that testing is beneficial for several reasons including knowing how to follow individuals after completing their treatment, identifying whether potential treatment options such as PARP inhibitors would be beneficial, and understand if other family members could be at risk for cancer and allow them to undergo genetic testing.   We reviewed the characteristics, features and inheritance patterns of hereditary cancer syndromes. We also discussed genetic testing, including the appropriate family members to test, the process of testing, insurance coverage and turn-around-time for results. We discussed the implications of a negative, positive, carrier and/or variant of uncertain significant result. [name]  was offered a common hereditary cancer panel (40+ genes) and an expanded pan-cancer panel (70+ genes). [name] was informed of the benefits and limitations of each panel, including that expanded pan-cancer panels contain genes that do not have clear management guidelines at this point in time.  We also discussed that as the number of genes included on a panel increases, the chances of variants of uncertain significance increases. [name] decided to pursue genetic testing for the CancerNext-Expanded+RNAinsight gene panel.   The CancerNext-Expanded gene panel offered by [facility] and includes sequencing, rearrangement, and RNA analysis for the following 76 genes: AIP, ALK, APC, ATM, AXIN2, BAP1, BARD1, BMPR1A, BRCA1, BRCA2, BRIP1, CDC73, CDH1, CDK4, CDKN1B, CDKN2A, CEBPA, CHEK2, CTNNA1, DDX41, DICER1, ETV6, FH, FLCN, GATA2, LZTR1, MAX, MBD4, MEN1, MET, MLH1, MSH2, MSH3, MSH6, MUTYH, NF1, NF2, NTHL1, PALB2, PHOX2B, PMS2, POT1, PRKAR1A, PTCH1, PTEN, RAD51C, RAD51D, RB1, RET, RUNX1, SDHA, SDHAF2, SDHB, SDHC, SDHD, SMAD4, SMARCA4, SMARCB1, SMARCE1, STK11, SUFU, TMEM127, TP53, TSC1, TSC2, VHL, and WT1 (sequencing and deletion/duplication); EGFR, HOXB13, KIT,  MITF, PDGFRA, POLD1, and POLE (sequencing only); EPCAM and GREM1 (deletion/duplication only).    Based on [name]'s personal and family history of cancer, she meets NCCN Breast/Ovarian/Pancreatic cancer criteria and NCCN Colorectal 3.2024 guidelines for genetic testing. Despite that she meets criteria, she may still have an out of pocket cost. We discussed that if her out of pocket cost for testing is over $100, the laboratory will call and confirm whether she wants to proceed with testing.  If the out of pocket cost of testing is less than $100 she will be billed by the  genetic testing laboratory.   We discussed that some people do not want to undergo genetic testing due to fear of genetic discrimination.  The Genetic Information Nondiscrimination Act (GINA) was signed into federal law in 2008. GINA prohibits health insurers and most employers from discriminating against individuals based on genetic information (including the results of genetic tests and family history information). According to GINA, health insurance companies cannot consider genetic information to be a preexisting condition, nor can they use it to make decisions regarding coverage or rates. GINA also makes it illegal for most employers to use genetic information in making decisions about hiring, firing, promotion, or terms of employment. It is important to note that GINA does not offer protections for life insurance, disability insurance, or long-term care insurance. GINA does not apply to those in the [employer], those who work for companies with less than 15 employees, and new life insurance or long-term disability insurance policies.  Health status due to a cancer diagnosis is not protected under GINA. More information about GINA can be found by visiting [email or web address].   PLAN: After considering the risks, benefits, and limitations, [name] provided informed consent to pursue genetic testing and the blood sample was sent to [facility] for analysis of the CancerNext-Expanded+RNAinsight. Results should be available within approximately 2-3 weeks' time, at which point they will be disclosed by telephone to [name], as will any additional recommendations warranted by these results. [name] will receive a summary of her genetic counseling visit and a copy of her results once available. This information will also be available in Epic.   Lastly, we encouraged [name] to remain in contact with cancer genetics annually so that we can continuously update the family history and inform her of any changes in cancer genetics and testing that may be of benefit for this family.   [name] questions were answered to her satisfaction today. Our contact information was provided should additional questions or concerns arise. Thank you for the referral and allowing [place] to share in the care of your patient.   Celestino Ackerman P. Lowell Guitar, MS, [facility] Licensed, Patent attorney [email or web address] phone: [phone]  The patient was seen for a total of 30 minutes in face-to-face genetic counseling.  The patient brought her son. Drs. Meliton Rattan, and/or [place] were available for questions, if needed..    _______________________________________________________________________ For Office Staff:  Number of people involved in session: 2 Was an Intern/ student involved with case: no

## 2023-06-28 ENCOUNTER — Telehealth: Payer: Self-pay | Admitting: Nurse Practitioner

## 2023-06-28 NOTE — Telephone Encounter (Signed)
Called patient and spoke to her and her son, [name], about recent blood work. Anemia is close to her baseline, but still showing some components of iron deficiency. She is feeling well, denies obvious bleeding.   She has not started oral iron. I recommend to start oral iron po once daily x3 months with vit C source. Reviewed potential SE's. She agrees.   Will postpone December visits to 3 months from now. They appreciated the call and had no other concerns.   Santiago Glad, NP

## 2023-07-18 ENCOUNTER — Other Ambulatory Visit: Payer: Medicare HMO

## 2023-07-25 ENCOUNTER — Ambulatory Visit: Payer: Medicare HMO | Admitting: Nurse Practitioner

## 2023-07-27 ENCOUNTER — Encounter: Payer: Self-pay | Admitting: Genetic Counselor

## 2023-07-27 ENCOUNTER — Ambulatory Visit: Payer: Self-pay | Admitting: Genetic Counselor

## 2023-07-27 ENCOUNTER — Telehealth: Payer: Self-pay | Admitting: Genetic Counselor

## 2023-07-27 DIAGNOSIS — C182 Malignant neoplasm of ascending colon: Secondary | ICD-10-CM

## 2023-07-27 DIAGNOSIS — Z1379 Encounter for other screening for genetic and chromosomal anomalies: Secondary | ICD-10-CM

## 2023-07-27 NOTE — Progress Notes (Signed)
HPI:  [name] was previously seen in the [facility] due to a personal and family history of cancer and concerns regarding a hereditary predisposition to cancer. Please refer to our prior cancer genetics clinic note for more information regarding our discussion, assessment and recommendations, at the time. [name] recent genetic test results were disclosed to her, as were recommendations warranted by these results. These results and recommendations are discussed in more detail below.  CANCER HISTORY:  Oncology History  Colon cancer (HCC)  04/06/2023 Initial Diagnosis   Colon cancer (HCC)   07/26/2023 Genetic Testing   Negative genetic testing on the CancerNext-Expanded+RNAinsight panel.  CHEK2  p.G178R (c.532G>A)  VUS identified.  The report date is 07/26/2023.  The CancerNext-Expanded gene panel offered by [facility] and includes sequencing, rearrangement, and RNA analysis for the following 76 genes: AIP, ALK, APC, ATM, AXIN2, BAP1, BARD1, BMPR1A, BRCA1, BRCA2, BRIP1, CDC73, CDH1, CDK4, CDKN1B, CDKN2A, CEBPA, CHEK2, CTNNA1, DDX41, DICER1, ETV6, FH, FLCN, GATA2, LZTR1, MAX, MBD4, MEN1, MET, MLH1, MSH2, MSH3, MSH6, MUTYH, NF1, NF2, NTHL1, PALB2, PHOX2B, PMS2, POT1, PRKAR1A, PTCH1, PTEN, RAD51C, RAD51D, RB1, RET, RUNX1, SDHA, SDHAF2, SDHB, SDHC, SDHD, SMAD4, SMARCA4, SMARCB1, SMARCE1, STK11, SUFU, TMEM127, TP53, TSC1, TSC2, VHL, and WT1 (sequencing and deletion/duplication); EGFR, HOXB13, KIT, MITF, PDGFRA, POLD1, and POLE (sequencing only); EPCAM and GREM1 (deletion/duplication only).       FAMILY HISTORY:  We obtained a detailed, 4-generation family history.  Significant diagnoses are listed below: Family History  Problem Relation Age of Onset   Heart disease Mother    Breast cancer Mother    Breast cancer Sister    Prostate cancer Brother    Prostate cancer Son 43       The patient has two sons, one son has prostate cancer.  The patient has one sister and 9  brothers, one was still born.  One brother has prostate cancer and her sister was diagnosed with breast cancer.  Both parents are deceased.   The patient's mother was diagnosed with breast cancer over the age of 40.  Her family was spread out and the patient is not as familiar with all relatives, but she is unaware of other cancers.     The patient's father died of a heart attack.  He had multiple siblings who are not known to have cancer.  There is no additional cancer diagnosis known on this side of the family.   [name] is unaware of previous family history of genetic testing for hereditary cancer risks.  There is no reported Ashkenazi Jewish ancestry. There is no known consanguinity.  GENETIC TEST RESULTS: Genetic testing reported out on July 26, 2023 through the CancerNext-Expanded+RNAinsight cancer panel found no pathogenic mutations. The CancerNext-Expanded gene panel offered by [facility] and includes sequencing, rearrangement, and RNA analysis for the following 76 genes: AIP, ALK, APC, ATM, AXIN2, BAP1, BARD1, BMPR1A, BRCA1, BRCA2, BRIP1, CDC73, CDH1, CDK4, CDKN1B, CDKN2A, CEBPA, CHEK2, CTNNA1, DDX41, DICER1, ETV6, FH, FLCN, GATA2, LZTR1, MAX, MBD4, MEN1, MET, MLH1, MSH2, MSH3, MSH6, MUTYH, NF1, NF2, NTHL1, PALB2, PHOX2B, PMS2, POT1, PRKAR1A, PTCH1, PTEN, RAD51C, RAD51D, RB1, RET, RUNX1, SDHA, SDHAF2, SDHB, SDHC, SDHD, SMAD4, SMARCA4, SMARCB1, SMARCE1, STK11, SUFU, TMEM127, TP53, TSC1, TSC2, VHL, and WT1 (sequencing and deletion/duplication); EGFR, HOXB13, KIT, MITF, PDGFRA, POLD1, and POLE (sequencing only); EPCAM and GREM1 (deletion/duplication only). The test report has been scanned into EPIC and is located under the Molecular Pathology section of the Results Review tab.  A portion of  the result report is included below for reference.     We discussed with [name] that because current genetic testing is not perfect, it is possible there may be a gene mutation in one of these genes  that current testing cannot detect, but that chance is small.  We also discussed, that there could be another gene that has not yet been discovered, or that we have not yet tested, that is responsible for the cancer diagnoses in the family. It is also possible there is a hereditary cause for the cancer in the family that [name] did not inherit and therefore was not identified in her testing.  Therefore, it is important to remain in touch with cancer genetics in the future so that we can continue to offer [name] the most up to date genetic testing.   ADDITIONAL GENETIC TESTING: We discussed with [name] that her genetic testing was fairly extensive.  If there are genes identified to increase cancer risk that can be analyzed in the future, we would be happy to discuss and coordinate this testing at that time.    CANCER SCREENING RECOMMENDATIONS: [name] test result is considered negative (normal).  This means that we have not identified a hereditary cause for her personal and family history of cancer at this time. Most cancers happen by chance and this negative test suggests that her personal and family history of cancer may fall into this category.    Possible reasons for [name]'s negative genetic test include:  1. There may be a gene mutation in one of these genes that current testing methods cannot detect but that chance is small.  2. There could be another gene that has not yet been discovered, or that we have not yet tested, that is responsible for the cancer diagnoses in the family.  3.  There may be no hereditary risk for cancer in the family. The cancers in [name] and/or her family may be sporadic/familial or due to other genetic and environmental factors. 4. It is also possible there is a hereditary cause for the cancer in the family that [name] did not inherit.  Therefore, it is recommended she continue to follow the cancer management and screening guidelines provided by her  oncology and primary healthcare provider. An individual's cancer risk and medical management are not determined by genetic test results alone. Overall cancer risk assessment incorporates additional factors, including personal medical history, family history, and any available genetic information that may result in a personalized plan for cancer prevention and surveillance  RECOMMENDATIONS FOR FAMILY MEMBERS:  Individuals in this family might be at some increased risk of developing cancer, over the general population risk, simply due to the family history of cancer.  We recommended women in this family have a yearly mammogram beginning at age 55, or 69 years younger than the earliest onset of cancer, an annual clinical breast exam, and perform monthly breast self-exams. Women in this family should also have a gynecological exam as recommended by their primary provider. All family members should be referred for colonoscopy starting at age 30, or 32 years younger than the earliest onset of cancer.  FOLLOW-UP: Lastly, we discussed with [name] that cancer genetics is a rapidly advancing field and it is possible that new genetic tests will be appropriate for her and/or her family members in the future. We encouraged her to remain in contact with cancer genetics on an annual basis so we can update her personal  and family histories and let her know of advances in cancer genetics that may benefit this family.   Our contact number was provided. [name] questions were answered to her satisfaction, and she knows she is welcome to call us at anytime with additional questions or concerns.   Maylon Cos, MS, [facility] Licensed, Certified Genetic Counselor [email or web address]

## 2023-07-27 NOTE — Telephone Encounter (Signed)
Revealed negative genetic testing.  Discussed that we do not know why she has colon cancer or why there is cancer in the family. It could be due to a different gene that we are not testing, or maybe our current technology may not be able to pick something up.  It will be important for her to keep in contact with genetics to keep up with whether additional testing may be needed.   CHEK2 VUS identified.  This will not change her medical management.

## 2023-09-11 ENCOUNTER — Inpatient Hospital Stay: Payer: Medicare HMO

## 2023-09-18 ENCOUNTER — Inpatient Hospital Stay: Payer: Medicare HMO | Admitting: Nurse Practitioner

## 2023-09-18 ENCOUNTER — Inpatient Hospital Stay: Payer: Medicare HMO

## 2023-09-18 ENCOUNTER — Telehealth: Payer: Self-pay | Admitting: Nurse Practitioner

## 2023-09-18 NOTE — Telephone Encounter (Signed)
Appointment was rescheduled per patients request

## 2023-09-21 ENCOUNTER — Inpatient Hospital Stay: Payer: Medicare HMO | Attending: Nurse Practitioner

## 2023-09-24 NOTE — Progress Notes (Deleted)
 Patient Care Team: Philip Aspen, Limmie Patricia, MD as PCP - General (Internal Medicine) Marinus Maw, MD as PCP - Cardiology (Cardiology) Marinus Maw, MD as PCP - Electrophysiology (Cardiology) Bridgett Larsson, LCSW as Social Worker (Licensed Clinical Social Worker)   CHIEF COMPLAINT: F/up h/o colon cancer, IDA, and PE  Oncology History  Colon cancer [facility])  04/06/2023 Initial Diagnosis   Colon cancer (HCC)   07/26/2023 Genetic Testing   Negative genetic testing on the CancerNext-Expanded+RNAinsight panel.  CHEK2  p.G178R (c.532G>A)  VUS identified.  The report date is 07/26/2023.  The CancerNext-Expanded gene panel offered by [facility] and includes sequencing, rearrangement, and RNA analysis for the following 76 genes: AIP, ALK, APC, ATM, AXIN2, BAP1, BARD1, BMPR1A, BRCA1, BRCA2, BRIP1, CDC73, CDH1, CDK4, CDKN1B, CDKN2A, CEBPA, CHEK2, CTNNA1, DDX41, DICER1, ETV6, FH, FLCN, GATA2, LZTR1, MAX, MBD4, MEN1, MET, MLH1, MSH2, MSH3, MSH6, MUTYH, NF1, NF2, NTHL1, PALB2, PHOX2B, PMS2, POT1, PRKAR1A, PTCH1, PTEN, RAD51C, RAD51D, RB1, RET, RUNX1, SDHA, SDHAF2, SDHB, SDHC, SDHD, SMAD4, SMARCA4, SMARCB1, SMARCE1, STK11, SUFU, TMEM127, TP53, TSC1, TSC2, VHL, and WT1 (sequencing and deletion/duplication); EGFR, HOXB13, KIT, MITF, PDGFRA, POLD1, and POLE (sequencing only); EPCAM and GREM1 (deletion/duplication only).        CURRENT THERAPY: Colon cancer surveillance, prenatal vitamin for IDA, and Eliquis for PE   INTERVAL HISTORY Ms. Mcmillion returns for follow up as scheduled. Last seen by me 04/25/23. She started oral iron in 06/2023. 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   rxd 3 yrs meds, no surgery in kidneys     Past Surgical History:  Procedure Laterality Date   ABDOMINAL HYSTERECTOMY     BIOPSY  03/23/2023   Procedure: BIOPSY;  Surgeon: Lynann Bologna, MD;  Location: [facility];  Service: Gastroenterology;;   BREAST BIOPSY  04/30/2012   Procedure: BREAST BIOPSY WITH NEEDLE LOCALIZATION;  Surgeon: Emelia Loron, MD;  Location: [facility];  Service: General;  Laterality: Left;  Left breast wire localization biospy   COLONOSCOPY WITH PROPOFOL N/A 03/23/2023   Procedure: COLONOSCOPY WITH PROPOFOL;  Surgeon: Lynann Bologna, MD;  Location: [facility];  Service: Gastroenterology;  Laterality: N/A;   ESOPHAGOGASTRODUODENOSCOPY (EGD) WITH PROPOFOL N/A 03/23/2023   Procedure: ESOPHAGOGASTRODUODENOSCOPY (EGD) WITH PROPOFOL;  Surgeon: Lynann Bologna, MD;  Location: [facility];  Service: Gastroenterology;  Laterality: N/A;   HEMORRHOID SURGERY     LAPAROTOMY N/A 03/26/2023   Procedure: EXPLORATORY LAPAROTOMY, RIGHT HEMICOLECTOMY; LYSIS OF ADHESIONS;  Surgeon: Manus Rudd, MD;  Location: [facility];  Service: General;  Laterality: N/A;   PACEMAKER INSERTION  02-06-2011   PPM GENERATOR CHANGEOUT N/A 04/19/2020   Procedure: PPM GENERATOR CHANGEOUT;  Surgeon: Marinus Maw, MD;  [facility];  Service: Cardiovascular;  Laterality: N/A;   SUBMUCOSAL TATTOO INJECTION  03/23/2023   Procedure: SUBMUCOSAL TATTOO INJECTION;  Surgeon: Chales Abrahams,  Filbert Berthold, MD;  Location: [facility];  Service: Gastroenterology;;   TAH and BSO     TONSILLECTOMY       Outpatient Encounter Medications as of 09/25/2023  Medication Sig   acetaminophen (TYLENOL) 325 MG tablet Take 2 tablets (650 mg total) by mouth every 6 (six) hours as needed for mild pain (or Fever >/= 101).   albuterol (VENTOLIN HFA) 108 (90 Base)  MCG/ACT inhaler Inhale 2 puffs into the lungs every 4 (four) hours as needed.   apixaban (ELIQUIS) 5 MG TABS tablet Take 1 tablet (5 mg total) by mouth 2 (two) times daily.   atorvastatin (LIPITOR) 20 MG tablet Take 1 tablet (20 mg total) by mouth daily.   bisacodyl (DULCOLAX) 5 MG EC tablet Take 1 tablet (5 mg total) by mouth daily as needed for moderate constipation.   budesonide-formoterol (SYMBICORT) 80-4.5 MCG/ACT inhaler INHALE 2 PUFFS INTO THE LUNGS 2 (TWO) TIMES DAILY. (Patient taking differently: Inhale 2 puffs into the lungs 2 (two) times daily.)   docusate sodium (COLACE) 100 MG capsule Take 1 capsule (100 mg total) by mouth 2 (two) times daily.   feeding supplement (ENSURE SURGERY) LIQD Take 237 mLs by mouth 3 (three) times daily between meals.   furosemide (LASIX) 20 MG tablet Take 1 tablet (20 mg total) by mouth daily as needed for fluid or edema.   montelukast (SINGULAIR) 10 MG tablet TAKE 1 TABLET AT BEDTIME   omeprazole (PRILOSEC) 20 MG capsule TAKE 1 CAPSULE EVERY DAY 30 TO 60 MINUTES BEFORE FIRST MEAL OF THE DAY   No facility-administered encounter medications on file as of 09/25/2023.     There were no vitals filed for this visit. There is no height or weight on file to calculate BMI.   PHYSICAL EXAM GENERAL:alert, no distress and comfortable SKIN: no rash  EYES: sclera clear NECK: without mass LYMPH:  no palpable cervical or supraclavicular lymphadenopathy  LUNGS: clear with normal breathing effort HEART: regular rate & rhythm, no lower extremity edema ABDOMEN: abdomen soft, non-tender and normal bowel sounds NEURO: alert & oriented x 3 with fluent speech, no focal motor/sensory deficits Breast exam:  PAC without erythema    CBC    Component Value Date/Time   WBC 6.1 06/26/2023 1358   WBC 10.8 (H) 04/06/2023 0419   RBC 4.00 06/26/2023 1358   HGB 9.5 (L) 06/26/2023 1358   HGB 11.0 (L) 04/06/2020 1327   HCT 31.5 (L) 06/26/2023 1358   HCT 33.4 (L) 04/06/2020  1327   PLT 278 06/26/2023 1358   PLT 213 04/06/2020 1327   MCV 78.8 (L) 06/26/2023 1358   MCV 84 04/06/2020 1327   MCH 23.8 (L) 06/26/2023 1358   MCHC 30.2 06/26/2023 1358   RDW 23.3 (H) 06/26/2023 1358   RDW 17.1 (H) 04/06/2020 1327   LYMPHSABS 1.3 06/26/2023 1358   LYMPHSABS 2.0 04/06/2020 1327   MONOABS 0.8 06/26/2023 1358   EOSABS 0.1 06/26/2023 1358   EOSABS 0.2 04/06/2020 1327   BASOSABS 0.0 06/26/2023 1358   BASOSABS 0.0 04/06/2020 1327     CMP     Component Value Date/Time   NA 143 06/26/2023 1358   NA 143 04/06/2020 1327   K 3.7 06/26/2023 1358   CL 108 06/26/2023 1358   CO2 29 06/26/2023 1358   GLUCOSE 98 06/26/2023 1358   BUN 10 06/26/2023 1358   BUN 15 04/06/2020 1327   CREATININE 0.99 06/26/2023 1358   CREATININE 1.08 05/05/2011 1536   CALCIUM 8.3 (L)  06/26/2023 1358   PROT 6.0 (L) 06/26/2023 1358   ALBUMIN 2.9 (L) 06/26/2023 1358   AST 22 06/26/2023 1358   ALT 12 06/26/2023 1358   ALKPHOS 145 (H) 06/26/2023 1358   BILITOT 0.9 06/26/2023 1358   GFRNONAA 56 (L) 06/26/2023 1358   GFRNONAA 50 (L) 05/05/2011 1536   GFRAA 60 04/06/2020 1327   GFRAA 60 (L) 05/05/2011 1536     ASSESSMENT & PLAN:  PLAN:  No orders of the defined types were placed in this encounter.     All questions were answered. The patient knows to call the clinic with any problems, questions or concerns. No barriers to learning were detected. I spent *** counseling the patient face to face. The total time spent in the appointment was *** and more than 50% was on counseling, review of test results, and coordination of care.   Santiago Glad, NP-C @DATE @

## 2023-09-25 ENCOUNTER — Telehealth: Payer: Self-pay

## 2023-09-25 ENCOUNTER — Inpatient Hospital Stay: Payer: Medicare HMO | Admitting: Nurse Practitioner

## 2023-09-25 NOTE — Telephone Encounter (Signed)
Spoke with patient's son, Lashana Spang regarding patient's missed appointment today. Shon Hale stated patient's care taker was not feeling well and that someone from the family will call our office to reschedule her appointment.

## 2023-10-01 ENCOUNTER — Other Ambulatory Visit: Payer: Self-pay | Admitting: Internal Medicine

## 2023-10-01 DIAGNOSIS — J4530 Mild persistent asthma, uncomplicated: Secondary | ICD-10-CM

## 2023-10-01 DIAGNOSIS — I5032 Chronic diastolic (congestive) heart failure: Secondary | ICD-10-CM

## 2023-10-01 DIAGNOSIS — I2699 Other pulmonary embolism without acute cor pulmonale: Secondary | ICD-10-CM

## 2023-10-19 ENCOUNTER — Observation Stay (HOSPITAL_COMMUNITY)
Admission: EM | Admit: 2023-10-19 | Discharge: 2023-10-22 | Disposition: A | Discharge status: Home / Self Care | Attending: Internal Medicine | Admitting: Internal Medicine

## 2023-10-19 ENCOUNTER — Encounter (HOSPITAL_COMMUNITY): Payer: Self-pay | Admitting: Internal Medicine

## 2023-10-19 ENCOUNTER — Emergency Department (HOSPITAL_COMMUNITY)

## 2023-10-19 ENCOUNTER — Other Ambulatory Visit: Payer: Self-pay

## 2023-10-19 DIAGNOSIS — Z85038 Personal history of other malignant neoplasm of large intestine: Secondary | ICD-10-CM | POA: Insufficient documentation

## 2023-10-19 DIAGNOSIS — E876 Hypokalemia: Secondary | ICD-10-CM | POA: Insufficient documentation

## 2023-10-19 DIAGNOSIS — I110 Hypertensive heart disease with heart failure: Secondary | ICD-10-CM | POA: Insufficient documentation

## 2023-10-19 DIAGNOSIS — Z79899 Other long term (current) drug therapy: Secondary | ICD-10-CM | POA: Diagnosis not present

## 2023-10-19 DIAGNOSIS — I10 Essential (primary) hypertension: Secondary | ICD-10-CM | POA: Diagnosis present

## 2023-10-19 DIAGNOSIS — D649 Anemia, unspecified: Secondary | ICD-10-CM | POA: Insufficient documentation

## 2023-10-19 DIAGNOSIS — C189 Malignant neoplasm of colon, unspecified: Secondary | ICD-10-CM | POA: Diagnosis present

## 2023-10-19 DIAGNOSIS — J449 Chronic obstructive pulmonary disease, unspecified: Secondary | ICD-10-CM | POA: Diagnosis not present

## 2023-10-19 DIAGNOSIS — Z1152 Encounter for screening for COVID-19: Secondary | ICD-10-CM | POA: Insufficient documentation

## 2023-10-19 DIAGNOSIS — I480 Paroxysmal atrial fibrillation: Secondary | ICD-10-CM | POA: Diagnosis not present

## 2023-10-19 DIAGNOSIS — Z87891 Personal history of nicotine dependence: Secondary | ICD-10-CM | POA: Diagnosis not present

## 2023-10-19 DIAGNOSIS — E785 Hyperlipidemia, unspecified: Secondary | ICD-10-CM | POA: Diagnosis not present

## 2023-10-19 DIAGNOSIS — R609 Edema, unspecified: Secondary | ICD-10-CM | POA: Insufficient documentation

## 2023-10-19 DIAGNOSIS — I509 Heart failure, unspecified: Secondary | ICD-10-CM

## 2023-10-19 DIAGNOSIS — Z950 Presence of cardiac pacemaker: Secondary | ICD-10-CM | POA: Diagnosis not present

## 2023-10-19 DIAGNOSIS — J4530 Mild persistent asthma, uncomplicated: Secondary | ICD-10-CM | POA: Diagnosis not present

## 2023-10-19 DIAGNOSIS — I5033 Acute on chronic diastolic (congestive) heart failure: Principal | ICD-10-CM | POA: Diagnosis present

## 2023-10-19 DIAGNOSIS — I442 Atrioventricular block, complete: Secondary | ICD-10-CM | POA: Diagnosis not present

## 2023-10-19 DIAGNOSIS — I495 Sick sinus syndrome: Secondary | ICD-10-CM | POA: Diagnosis present

## 2023-10-19 DIAGNOSIS — R600 Localized edema: Secondary | ICD-10-CM

## 2023-10-19 DIAGNOSIS — R0602 Shortness of breath: Secondary | ICD-10-CM | POA: Diagnosis not present

## 2023-10-19 DIAGNOSIS — K219 Gastro-esophageal reflux disease without esophagitis: Secondary | ICD-10-CM | POA: Diagnosis present

## 2023-10-19 LAB — CBC WITH DIFFERENTIAL/PLATELET
Abs Immature Granulocytes: 0.02 10*3/uL (ref 0.00–0.07)
Basophils Absolute: 0 10*3/uL (ref 0.0–0.1)
Basophils Relative: 1 %
Eosinophils Absolute: 0.1 10*3/uL (ref 0.0–0.5)
Eosinophils Relative: 1 %
HCT: 30 % — ABNORMAL LOW (ref 36.0–46.0)
Hemoglobin: 8.3 g/dL — ABNORMAL LOW (ref 12.0–15.0)
Immature Granulocytes: 0 %
Lymphocytes Relative: 20 %
Lymphs Abs: 1.3 10*3/uL (ref 0.7–4.0)
MCH: 19.6 pg — ABNORMAL LOW (ref 26.0–34.0)
MCHC: 27.7 g/dL — ABNORMAL LOW (ref 30.0–36.0)
MCV: 70.9 fL — ABNORMAL LOW (ref 80.0–100.0)
Monocytes Absolute: 0.9 10*3/uL (ref 0.1–1.0)
Monocytes Relative: 13 %
Neutro Abs: 4.5 10*3/uL (ref 1.7–7.7)
Neutrophils Relative %: 65 %
Platelets: 254 10*3/uL (ref 150–400)
RBC: 4.23 MIL/uL (ref 3.87–5.11)
RDW: 23.6 % — ABNORMAL HIGH (ref 11.5–15.5)
WBC: 6.8 10*3/uL (ref 4.0–10.5)
nRBC: 0 % (ref 0.0–0.2)

## 2023-10-19 LAB — COMPREHENSIVE METABOLIC PANEL
ALT: 18 U/L (ref 0–44)
AST: 34 U/L (ref 15–41)
Albumin: 3.1 g/dL — ABNORMAL LOW (ref 3.5–5.0)
Alkaline Phosphatase: 119 U/L (ref 38–126)
Anion gap: 10 (ref 5–15)
BUN: 16 mg/dL (ref 8–23)
CO2: 22 mmol/L (ref 22–32)
Calcium: 8.2 mg/dL — ABNORMAL LOW (ref 8.9–10.3)
Chloride: 107 mmol/L (ref 98–111)
Creatinine, Ser: 1.05 mg/dL — ABNORMAL HIGH (ref 0.44–1.00)
GFR, Estimated: 52 mL/min — ABNORMAL LOW (ref >60)
Glucose, Bld: 100 mg/dL — ABNORMAL HIGH (ref 70–99)
Potassium: 3.1 mmol/L — ABNORMAL LOW (ref 3.5–5.1)
Sodium: 139 mmol/L (ref 135–145)
Total Bilirubin: 0.8 mg/dL (ref 0.0–1.2)
Total Protein: 6.4 g/dL — ABNORMAL LOW (ref 6.5–8.1)

## 2023-10-19 LAB — RESP PANEL BY RT-PCR (RSV, FLU A&B, COVID)  RVPGX2
Influenza A by PCR: NEGATIVE
Influenza B by PCR: NEGATIVE
Resp Syncytial Virus by PCR: NEGATIVE
SARS Coronavirus 2 by RT PCR: NEGATIVE

## 2023-10-19 LAB — MAGNESIUM: Magnesium: 1.8 mg/dL (ref 1.7–2.4)

## 2023-10-19 LAB — BRAIN NATRIURETIC PEPTIDE: B Natriuretic Peptide: 411.6 pg/mL — ABNORMAL HIGH (ref 0.0–100.0)

## 2023-10-19 LAB — TROPONIN I (HIGH SENSITIVITY)
Troponin I (High Sensitivity): 17 ng/L (ref <18)
Troponin I (High Sensitivity): 17 ng/L (ref <18)

## 2023-10-19 MED ORDER — ATORVASTATIN CALCIUM 20 MG PO TABS
20.0000 mg | ORAL_TABLET | Freq: Every day | ORAL | Status: DC
  Administered 2023-10-19 – 2023-10-21 (×3): 20 mg via ORAL
  Filled 2023-10-19 (×3): qty 1
  Filled 2023-10-19: qty 2

## 2023-10-19 MED ORDER — POTASSIUM CHLORIDE CRYS ER 20 MEQ PO TBCR
40.0000 meq | EXTENDED_RELEASE_TABLET | Freq: Once | ORAL | Status: AC
  Administered 2023-10-19: 40 meq via ORAL
  Filled 2023-10-19: qty 2

## 2023-10-19 MED ORDER — MONTELUKAST SODIUM 10 MG PO TABS
10.0000 mg | ORAL_TABLET | Freq: Every day | ORAL | Status: DC
  Administered 2023-10-19 – 2023-10-21 (×3): 10 mg via ORAL
  Filled 2023-10-19 (×4): qty 1

## 2023-10-19 MED ORDER — APIXABAN 5 MG PO TABS
5.0000 mg | ORAL_TABLET | Freq: Two times a day (BID) | ORAL | Status: DC
  Administered 2023-10-19 – 2023-10-22 (×6): 5 mg via ORAL
  Filled 2023-10-19 (×6): qty 1

## 2023-10-19 MED ORDER — ALBUTEROL SULFATE (2.5 MG/3ML) 0.083% IN NEBU: 2.5000 mg | INHALATION_SOLUTION | RESPIRATORY_TRACT | Status: DC | PRN

## 2023-10-19 MED ORDER — FUROSEMIDE 10 MG/ML IJ SOLN
40.0000 mg | Freq: Once | INTRAMUSCULAR | Status: AC
  Administered 2023-10-19: 40 mg via INTRAVENOUS
  Filled 2023-10-19: qty 4

## 2023-10-19 MED ORDER — MOMETASONE FURO-FORMOTEROL FUM 100-5 MCG/ACT IN AERO
2.0000 | INHALATION_SPRAY | Freq: Two times a day (BID) | RESPIRATORY_TRACT | Status: DC
  Administered 2023-10-20 – 2023-10-22 (×4): 2 via RESPIRATORY_TRACT
  Filled 2023-10-19: qty 8.8

## 2023-10-19 NOTE — H&P (Signed)
 History and Physical    [name] WUJ:[id number] DOB: [birth year] DOA: 10/19/2023  Patient coming from: Home.  Chief Complaint: Shortness of breath and lower extremity edema.  HPI: [name] is a 83 y.o. female with show chronic diastolic CHF, complete heart block status post pacemaker placement, paroxysmal atrial fibrillation, COPD/asthma, chronic anemia, history of colon cancer s/p resection in August 2024 presents to the ER because of increasing exertional dyspnea and lower extremity edema worsening last 2 weeks.  Patient states she has not been very compliant with her Lasix.  Denies any chest pain fever or chills.  ED Course: In the ER chest x-ray is unremarkable.  BNP is 411 troponins are negative.  Hemoglobin 8.3 potassium was 3.1 for which patient was given replacement with creatinine 1.  On exam patient has 3+ edema in the lower extremities.  Patient was given 40 mg IV Lasix and admitted for further management of CHF exacerbation.  Review of Systems: As per HPI, rest all negative.   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   rxd 3 yrs meds, no surgery in kidneys    Past Surgical History:  Procedure Laterality Date   ABDOMINAL HYSTERECTOMY     BIOPSY  03/23/2023   Procedure: BIOPSY;  Surgeon: Lynann Bologna, MD;  Location: [facility];  Service: Gastroenterology;;   BREAST BIOPSY  04/30/2012   Procedure: BREAST BIOPSY WITH NEEDLE LOCALIZATION;  Surgeon: Emelia Loron, MD;  Location: [facility];  Service: General;  Laterality: Left;  Left breast wire localization biospy    COLONOSCOPY WITH PROPOFOL N/A 03/23/2023   Procedure: COLONOSCOPY WITH PROPOFOL;  Surgeon: Lynann Bologna, MD;  Location: [facility];  Service: Gastroenterology;  Laterality: N/A;   ESOPHAGOGASTRODUODENOSCOPY (EGD) WITH PROPOFOL N/A 03/23/2023   Procedure: ESOPHAGOGASTRODUODENOSCOPY (EGD) WITH PROPOFOL;  Surgeon: Lynann Bologna, MD;  Location: [facility];  Service: Gastroenterology;  Laterality: N/A;   HEMORRHOID SURGERY     LAPAROTOMY N/A 03/26/2023   Procedure: EXPLORATORY LAPAROTOMY, RIGHT HEMICOLECTOMY; LYSIS OF ADHESIONS;  Surgeon: Manus Rudd, MD;  Location: [facility];  Service: General;  Laterality: N/A;   PACEMAKER INSERTION  02-06-2011   PPM GENERATOR CHANGEOUT N/A 04/19/2020   Procedure: PPM GENERATOR CHANGEOUT;  Surgeon: Marinus Maw, MD;  Location: [facility];  Service: Cardiovascular;  Laterality: N/A;   SUBMUCOSAL TATTOO INJECTION  03/23/2023   Procedure: SUBMUCOSAL TATTOO INJECTION;  Surgeon: Lynann Bologna, MD;  Location: [facility];  Service: Gastroenterology;;   TAH and BSO     TONSILLECTOMY       reports that she quit smoking about 46 years ago. Her smoking use included cigarettes. She started smoking about 50 years ago. She has a 4 pack-year smoking history. She has never used smokeless tobacco. She reports that she does not drink alcohol and does not use drugs.  Allergies  Allergen Reactions   Albuterol Palpitations and Other (See Comments)    Irregular heartbeat  Family History  Problem Relation Age of Onset   Heart disease Mother    Breast cancer Mother    Breast cancer Sister    Prostate cancer Brother    Prostate cancer Son 25    Prior to Admission medications   Medication Sig Start Date End Date Taking? Authorizing Provider  acetaminophen (TYLENOL) 325 MG tablet Take 2 tablets (650 mg total) by mouth every 6 (six) hours as needed for mild pain (or Fever >/= 101). 04/06/23  Yes Dahal, Melina Schools, MD  albuterol (VENTOLIN HFA) 108 (90 Base) MCG/ACT  inhaler Inhale 2 puffs into the lungs every 4 (four) hours as needed. Patient taking differently: Inhale 2 puffs into the lungs every 4 (four) hours as needed for wheezing or shortness of breath. 05/23/23  Yes Philip Aspen, Limmie Patricia, MD  apixaban (ELIQUIS) 5 MG TABS tablet TAKE 1 TABLET TWICE DAILY 10/01/23  Yes Philip Aspen, Limmie Patricia, MD  atorvastatin (LIPITOR) 20 MG tablet TAKE 1 TABLET EVERY DAY Patient taking differently: Take 20 mg by mouth at bedtime. 10/01/23  Yes Philip Aspen, Limmie Patricia, MD  budesonide-formoterol (SYMBICORT) 80-4.5 MCG/ACT inhaler INHALE 2 PUFFS INTO THE LUNGS 2 (TWO) TIMES DAILY. Patient taking differently: Inhale 2 puffs into the lungs 2 (two) times daily. 11/13/22  Yes Philip Aspen, Limmie Patricia, MD  furosemide (LASIX) 20 MG tablet Take 1 tablet (20 mg total) by mouth daily as needed for fluid or edema. 05/23/23  Yes Philip Aspen, Limmie Patricia, MD  montelukast (SINGULAIR) 10 MG tablet TAKE 1 TABLET AT BEDTIME 10/01/23  Yes Philip Aspen, Limmie Patricia, MD  Nutritional Supplements (ENSURE ORIGINAL) LIQD Take 237 mLs by mouth daily as needed (when the appetite is lacking).   Yes [provider]  omeprazole (PRILOSEC) 20 MG capsule TAKE 1 CAPSULE EVERY DAY 30 TO 60 MINUTES BEFORE FIRST MEAL OF THE DAY Patient taking differently: Take 20 mg by mouth daily before breakfast. 06/20/23  Yes Philip Aspen, Limmie Patricia, MD  bisacodyl (DULCOLAX) 5 MG EC tablet Take 1 tablet (5 mg total) by mouth daily as needed for moderate constipation. Patient not taking: Reported on 10/19/2023 04/06/23   Lorin Glass, MD  docusate sodium (COLACE) 100 MG capsule Take 1 capsule (100 mg total) by mouth 2 (two) times daily. Patient not taking: Reported on 10/19/2023 04/06/23   Lorin Glass, MD  feeding supplement (ENSURE SURGERY) LIQD Take 237 mLs by mouth 3 (three) times daily between meals. Patient not taking: Reported on 10/19/2023 04/06/23   Lorin Glass, MD    Physical  Exam: Constitutional: Moderately built and nourished. Vitals:   10/19/23 1710 10/19/23 2030  BP: 124/62 117/67  Pulse: 70 70  Resp: 17 14  Temp: 98.1 F (36.7 C)   TempSrc: Oral   SpO2: 100% 100%   Eyes: Anicteric no pallor. ENMT: No discharge from the ears eyes nose or mouth. Neck: No mass felt.  No neck rigidity. Respiratory: No rhonchi or crepitations. Cardiovascular: S1-S2 heard. Abdomen: Soft nontender bowel sound present. Musculoskeletal: Bilateral lower extremity edema present. Skin: No rash. Neurologic: Alert awake oriented to time place and person.  Moves all extremities. Psychiatric: Appears normal.  Normal affect.   Labs on Admission: I have personally reviewed following labs and imaging studies  CBC: Recent Labs  Lab 10/19/23 1813  WBC 6.8  NEUTROABS 4.5  HGB 8.3*  HCT 30.0*  MCV 70.9*  PLT 254   Basic Metabolic Panel: Recent Labs  Lab 10/19/23 1813  NA 139  K 3.1*  CL 107  CO2 22  GLUCOSE 100*  BUN 16  CREATININE 1.05*  CALCIUM 8.2*  MG 1.8   GFR: CrCl cannot be calculated (Unknown ideal weight.). Liver Function Tests: Recent Labs  Lab 10/19/23 1813  AST 34  ALT 18  ALKPHOS 119  BILITOT 0.8  PROT 6.4*  ALBUMIN 3.1*   No results for input(s): "LIPASE", "AMYLASE" in the last 168 hours. No results for input(s): "AMMONIA" in the last 168 hours. Coagulation Profile: No results for input(s): "INR", "PROTIME" in the last 168 hours. Cardiac Enzymes: No results for input(s): "CKTOTAL", "CKMB", "CKMBINDEX", "TROPONINI" in the last 168 hours. BNP (last 3 results) No results for input(s): "PROBNP" in the last 8760 hours. HbA1C: No results for input(s): "HGBA1C" in the last 72 hours. CBG: No results for input(s): "GLUCAP" in the last 168 hours. Lipid Profile: No results for input(s): "CHOL", "HDL", "LDLCALC", "TRIG", "CHOLHDL", "LDLDIRECT" in the last 72 hours. Thyroid Function Tests: No results for input(s): "TSH", "T4TOTAL", "FREET4",  "T3FREE", "THYROIDAB" in the last 72 hours. Anemia Panel: No results for input(s): "VITAMINB12", "FOLATE", "FERRITIN", "TIBC", "IRON", "RETICCTPCT" in the last 72 hours. Urine analysis:    Component Value Date/Time   COLORURINE YELLOW 12/08/2022 0926   APPEARANCEUR HAZY (A) 12/08/2022 0926   LABSPEC 1.024 12/08/2022 0926   PHURINE 5.0 12/08/2022 0926   GLUCOSEU NEGATIVE 12/08/2022 0926   HGBUR NEGATIVE 12/08/2022 0926   BILIRUBINUR NEGATIVE 12/08/2022 0926   KETONESUR NEGATIVE 12/08/2022 0926   PROTEINUR NEGATIVE 12/08/2022 0926   NITRITE NEGATIVE 12/08/2022 0926   LEUKOCYTESUR LARGE (A) 12/08/2022 0926   Sepsis Labs: @LABRCNTIP (procalcitonin:4,lacticidven:4) ) Recent Results (from the past 240 hours)  Resp panel by RT-PCR (RSV, Flu A&B, Covid) Anterior Nasal Swab     Status: None   Collection Time: 10/19/23  6:35 PM   Specimen: Anterior Nasal Swab  Result Value Ref Range Status   SARS Coronavirus 2 by RT PCR NEGATIVE NEGATIVE Final    Comment: (NOTE) SARS-CoV-2 target nucleic acids are NOT DETECTED.  The SARS-CoV-2 RNA is generally detectable in upper respiratory specimens during the acute phase of infection. The lowest concentration of SARS-CoV-2 viral copies this assay can detect is 138 copies/mL. A negative result does not preclude SARS-Cov-2 infection and should not be used as the sole basis for treatment or other patient management decisions. A negative result may occur with  improper specimen collection/handling, submission of specimen other than nasopharyngeal swab, presence of viral mutation(s) within the areas targeted by this assay, and inadequate number of viral copies(<138 copies/mL). A negative result must be combined with clinical observations, patient history, and epidemiological information. The expected result is Negative.  Fact Sheet for Patients:  BloggerCourse.com  Fact Sheet for Healthcare Providers:   SeriousBroker.it  This test is no t yet approved or cleared by the [place] FDA and  has been authorized for detection and/or diagnosis of SARS-CoV-2 by FDA under an Emergency Use Authorization (EUA). This EUA will remain  in effect (meaning this test can be used) for the duration of the COVID-19 declaration under Section 564(b)(1) of the Act, 21 U.S.C.section 360bbb-3(b)(1), unless the authorization is terminated  or revoked sooner.       Influenza A by PCR NEGATIVE NEGATIVE Final   Influenza B by PCR NEGATIVE NEGATIVE Final    Comment: (NOTE) The Xpert Xpress SARS-CoV-2/FLU/RSV plus assay is intended as an aid in the diagnosis of influenza from Nasopharyngeal swab specimens and should not be used as a sole basis for  treatment. Nasal washings and aspirates are unacceptable for Xpert Xpress SARS-CoV-2/FLU/RSV testing.  Fact Sheet for Patients: BloggerCourse.com  Fact Sheet for Healthcare Providers: SeriousBroker.it  This test is not yet approved or cleared by the [place] FDA and has been authorized for detection and/or diagnosis of SARS-CoV-2 by FDA under an Emergency Use Authorization (EUA). This EUA will remain in effect (meaning this test can be used) for the duration of the COVID-19 declaration under Section 564(b)(1) of the Act, 21 U.S.C. section 360bbb-3(b)(1), unless the authorization is terminated or revoked.     Resp Syncytial Virus by PCR NEGATIVE NEGATIVE Final    Comment: (NOTE) Fact Sheet for Patients: BloggerCourse.com  Fact Sheet for Healthcare Providers: SeriousBroker.it  This test is not yet approved or cleared by the [place] FDA and has been authorized for detection and/or diagnosis of SARS-CoV-2 by FDA under an Emergency Use Authorization (EUA). This EUA will remain in effect (meaning this test can be used) for  the duration of the COVID-19 declaration under Section 564(b)(1) of the Act, 21 U.S.C. section 360bbb-3(b)(1), unless the authorization is terminated or revoked.  Performed at [facility], [address] [place], [place] [address]      Radiological Exams on Admission: DG Chest Port 1 View Result Date: 10/19/2023 CLINICAL DATA:  Shortness of breath EXAM: PORTABLE CHEST 1 VIEW COMPARISON:  03/20/2023 FINDINGS: Stable cardiomediastinal silhouette. Left chest wall pacemaker. Chronic retrocardiac atelectasis/scarring. Otherwise no focal consolidation, pleural effusion, or pneumothorax. No displaced rib fractures. IMPRESSION: No active disease. Electronically Signed   By: Minerva Fester M.D.   On: 10/19/2023 18:57    EKG: Independently reviewed.  Paced rhythm.  Assessment/Plan Principal Problem:   Acute on chronic diastolic CHF (congestive heart failure) (HCC) Active Problems:   Essential hypertension   Tachy-brady syndrome (HCC)   GERD (gastroesophageal reflux disease)   Mild persistent asthma   Complete heart block (HCC)   Acute CHF (congestive heart failure) (HCC)    Acute on chronic diastolic CHF last EF measured was 55 to 60% with grade 1 diastolic dysfunction on 5/24.  Patient takes 20 mg of Lasix at home and has not been consistent with it for the last few days.  Patient received 1 dose of Lasix 40 mg IV in the ER and has been having good urine output.  Will keep patient on 40 mg IV every 12.  Closely follow intake output metabolic panel daily weights.  Check Dopplers of the lower extremity. Paroxysmal atrial fibrillation on Eliquis.  Not on any rate limiting medications. History of complete heart block status post pacemaker placement. Anemia appears to be chronic.  Follow CBC.  Check anemia panel. COPD/asthma on Dulera and Singulair. Hyperlipidemia on statins. Colon cancer s/p resection in August 2024.  Since patient has CHF exacerbation will need further  management and close observation and more than 2 midnight stay.   DVT prophylaxis: Eliquis. Code Status: Full code. Family Communication: Discussed with patient. Disposition Plan: Monitored bed. Consults called: None. Admission status: Observation.

## 2023-10-19 NOTE — ED Provider Notes (Addendum)
 [place] EMERGENCY DEPARTMENT AT [facility] Provider Note   CSN: [id number] Arrival date & time: 10/19/23  1655     History  Chief Complaint  Patient presents with   Leg Swelling   Shortness of Breath    [name] is a 83 y.o. female.  She has a history of A-fib on Eliquis, CHF on Lasix 20, colon cancer.  Daughter is bringing her in today for increased shortness of breath and swelling in her legs.  Daughter states she does not take her diuretics all the time.  Patient lives alone.  She said she is not here with any chest pain now but she has had some earlier.  She is constipated.  Nausea but no vomiting.  No cough or fever.  No urinary symptoms.  Legs are painful  The history is provided by the patient.  Shortness of Breath Severity:  Moderate Onset quality:  Gradual Timing:  Constant Progression:  Unchanged Chronicity:  Recurrent Relieved by:  Nothing Worsened by:  Activity Ineffective treatments:  Rest Associated symptoms: chest pain   Associated symptoms: no abdominal pain, no cough, no fever, no hemoptysis, no sputum production and no vomiting        Home Medications Prior to Admission medications   Medication Sig Start Date End Date Taking? Authorizing Provider  acetaminophen (TYLENOL) 325 MG tablet Take 2 tablets (650 mg total) by mouth every 6 (six) hours as needed for mild pain (or Fever >/= 101). 04/06/23   Lorin Glass, MD  albuterol (VENTOLIN HFA) 108 (90 Base) MCG/ACT inhaler Inhale 2 puffs into the lungs every 4 (four) hours as needed. 05/23/23   Philip Aspen, Limmie Patricia, MD  apixaban (ELIQUIS) 5 MG TABS tablet TAKE 1 TABLET TWICE DAILY 10/01/23   Philip Aspen, Limmie Patricia, MD  atorvastatin (LIPITOR) 20 MG tablet TAKE 1 TABLET EVERY DAY 10/01/23   Philip Aspen, Limmie Patricia, MD  bisacodyl (DULCOLAX) 5 MG EC tablet Take 1 tablet (5 mg total) by mouth daily as needed for moderate constipation. 04/06/23   Lorin Glass, MD  budesonide-formoterol  (SYMBICORT) 80-4.5 MCG/ACT inhaler INHALE 2 PUFFS INTO THE LUNGS 2 (TWO) TIMES DAILY. Patient taking differently: Inhale 2 puffs into the lungs 2 (two) times daily. 11/13/22   Philip Aspen, Limmie Patricia, MD  docusate sodium (COLACE) 100 MG capsule Take 1 capsule (100 mg total) by mouth 2 (two) times daily. 04/06/23   Lorin Glass, MD  feeding supplement (ENSURE SURGERY) LIQD Take 237 mLs by mouth 3 (three) times daily between meals. 04/06/23   Lorin Glass, MD  furosemide (LASIX) 20 MG tablet Take 1 tablet (20 mg total) by mouth daily as needed for fluid or edema. 05/23/23   Philip Aspen, Limmie Patricia, MD  montelukast (SINGULAIR) 10 MG tablet TAKE 1 TABLET AT BEDTIME 10/01/23   Philip Aspen, Limmie Patricia, MD  omeprazole (PRILOSEC) 20 MG capsule TAKE 1 CAPSULE EVERY DAY 30 TO 60 MINUTES BEFORE FIRST MEAL OF THE DAY 06/20/23   Philip Aspen, Limmie Patricia, MD      Allergies    Albuterol    Review of Systems   Review of Systems  Constitutional:  Negative for fever.  Respiratory:  Positive for shortness of breath. Negative for cough, hemoptysis and sputum production.   Cardiovascular:  Positive for chest pain.  Gastrointestinal:  Negative for abdominal pain and vomiting.    Physical Exam Updated Vital Signs BP 124/62   Pulse 70   Temp 98.1 F (36.7 C) (  Oral)   Resp 17   SpO2 100%  Physical Exam Vitals and nursing note reviewed.  Constitutional:      General: She is not in acute distress.    Appearance: Normal appearance. She is well-developed.  HENT:     Head: Normocephalic and atraumatic.  Eyes:     Conjunctiva/sclera: Conjunctivae normal.  Cardiovascular:     Rate and Rhythm: Normal rate and regular rhythm.     Heart sounds: No murmur heard. Pulmonary:     Effort: Pulmonary effort is normal. No respiratory distress.     Breath sounds: Normal breath sounds.  Abdominal:     Palpations: Abdomen is soft.     Tenderness: There is no abdominal tenderness. There is no guarding or  rebound.  Musculoskeletal:        General: No swelling.     Cervical back: Neck supple.     Right lower leg: Tenderness present. Edema present.     Left lower leg: Tenderness present. Edema present.  Skin:    General: Skin is warm and dry.     Capillary Refill: Capillary refill takes less than 2 seconds.  Neurological:     General: No focal deficit present.     Mental Status: She is alert.     Motor: No weakness.     ED Results / Procedures / Treatments   Labs (all labs ordered are listed, but only abnormal results are displayed) Labs Reviewed  BRAIN NATRIURETIC PEPTIDE - Abnormal; Notable for the following components:      Result Value   B Natriuretic Peptide 411.6 (*)    All other components within normal limits  COMPREHENSIVE METABOLIC PANEL - Abnormal; Notable for the following components:   Potassium 3.1 (*)    Glucose, Bld 100 (*)    Creatinine, Ser 1.05 (*)    Calcium 8.2 (*)    Total Protein 6.4 (*)    Albumin 3.1 (*)    GFR, Estimated 52 (*)    All other components within normal limits  CBC WITH DIFFERENTIAL/PLATELET - Abnormal; Notable for the following components:   Hemoglobin 8.3 (*)    HCT 30.0 (*)    MCV 70.9 (*)    MCH 19.6 (*)    MCHC 27.7 (*)    RDW 23.6 (*)    All other components within normal limits  BASIC METABOLIC PANEL - Abnormal; Notable for the following components:   Potassium 2.9 (*)    Calcium 8.0 (*)    All other components within normal limits  CBC - Abnormal; Notable for the following components:   RBC 3.73 (*)    Hemoglobin 7.5 (*)    HCT 25.4 (*)    MCV 68.1 (*)    MCH 20.1 (*)    MCHC 29.5 (*)    RDW 23.5 (*)    All other components within normal limits  FERRITIN - Abnormal; Notable for the following components:   Ferritin 5 (*)    All other components within normal limits  IRON AND TIBC - Abnormal; Notable for the following components:   Iron 15 (*)    Saturation Ratios 4 (*)    All other components within normal limits   RETICULOCYTES - Abnormal; Notable for the following components:   Immature Retic Fract 30.0 (*)    All other components within normal limits  RESP PANEL BY RT-PCR (RSV, FLU A&B, COVID)  RVPGX2  MAGNESIUM  FOLATE  VITAMIN B12  MAGNESIUM  TROPONIN I (HIGH SENSITIVITY)  TROPONIN I (HIGH SENSITIVITY)    EKG EKG Interpretation Date/Time:  Friday October 19 2023 18:11:55 EDT Ventricular Rate:  70 PR Interval:    QRS Duration:  137 QT Interval:  503 QTC Calculation: 543 R Axis:   13  Text Interpretation: AV PACED RHYTHM No significant change since prior 5/24 Confirmed by Meridee Score [phone]) on 10/19/2023 6:15:34 PM  Radiology DG Chest Port 1 View Result Date: 10/19/2023 CLINICAL DATA:  Shortness of breath EXAM: PORTABLE CHEST 1 VIEW COMPARISON:  03/20/2023 FINDINGS: Stable cardiomediastinal silhouette. Left chest wall pacemaker. Chronic retrocardiac atelectasis/scarring. Otherwise no focal consolidation, pleural effusion, or pneumothorax. No displaced rib fractures. IMPRESSION: No active disease. Electronically Signed   By: Minerva Fester M.D.   On: 10/19/2023 18:57    Procedures Procedures    Medications Ordered in ED Medications  atorvastatin (LIPITOR) tablet 20 mg (20 mg Oral Given 10/19/23 2157)  apixaban (ELIQUIS) tablet 5 mg (5 mg Oral Given 10/20/23 0938)  albuterol (PROVENTIL) (2.5 MG/3ML) 0.083% nebulizer solution 2.5 mg (has no administration in time range)  mometasone-formoterol (DULERA) 100-5 MCG/ACT inhaler 2 puff (2 puffs Inhalation Not Given 10/20/23 1059)  montelukast (SINGULAIR) tablet 10 mg (10 mg Oral Given 10/19/23 2157)  furosemide (LASIX) injection 40 mg (40 mg Intravenous Given 10/20/23 0642)  potassium chloride SA (KLOR-CON M) CR tablet 40 mEq (40 mEq Oral Given 10/20/23 0904)  potassium chloride SA (KLOR-CON M) CR tablet 40 mEq (40 mEq Oral Given 10/19/23 1958)  furosemide (LASIX) injection 40 mg (40 mg Intravenous Given 10/19/23 1958)    ED Course/ Medical  Decision Making/ A&P Clinical Course as of 10/20/23 1109  Fri Oct 19, 2023  1751 Chest x-ray without significant signs of fluid overload or infiltrate.  Awaiting radiology reading. [MB]  1924 I reviewed results with patient and family.  She is agreeable to admission to the hospital for continued IV diuresis.  Potassium levels ordered her some potassium. [MB]  1950 Discussed with Dr. Toniann Fail [facility] hospitalist who will evaluate patient for admission. [MB]    Clinical Course User Index [MB] Terrilee Files, MD                                 Medical Decision Making Amount and/or Complexity of Data Reviewed Labs: ordered. Radiology: ordered.  Risk Prescription drug management. Decision regarding hospitalization.   This patient complains of weight gain shortness of breath swollen legs; this involves an extensive number of treatment Options and is a complaint that carries with it a high risk of complications and morbidity. The differential includes CHF, peripheral edema, DVT, medication compliance  I ordered, reviewed and interpreted labs, which included CBC with hemoglobin lower than baseline, chemistries with low potassium, BNP elevated, COVID and flu negative I ordered medication oral potassium IV Lasix and reviewed PMP when indicated. I ordered imaging studies which included chest x-ray and I independently    visualized and interpreted imaging which showed no acute infiltrate Additional history obtained from patient's daughter Previous records obtained and reviewed in epic including recent PCP notes I consulted Dr. Toniann Fail [facility] hospitalist and discussed lab and imaging findings and discussed disposition.  Cardiac monitoring reviewed, paced rhythm Social determinants considered, no significant barriers Critical Interventions: None  After the interventions stated above, I reevaluated the patient and found patient to be without any respiratory distress Admission and further  testing considered, she would benefit from admission to the hospital for IV  diuresis and management of her electrolytes.  She is in agreement with plan for admission.         Final Clinical Impression(s) / ED Diagnoses Final diagnoses:  Acute on chronic congestive heart failure, unspecified heart failure type (HCC)  Peripheral edema  Hypokalemia    Rx / DC Orders ED Discharge Orders     None         Terrilee Files, MD 10/20/23 1112    Terrilee Files, MD 10/20/23 1112

## 2023-10-19 NOTE — ED Triage Notes (Signed)
 Pt c/o shortness of breath and bilateral leg swelling. Pt has a hx of CHF, but forgets to take meds, per daughter, she has not taken fluid pills this week.

## 2023-10-20 ENCOUNTER — Observation Stay (HOSPITAL_BASED_OUTPATIENT_CLINIC_OR_DEPARTMENT_OTHER)

## 2023-10-20 DIAGNOSIS — R609 Edema, unspecified: Secondary | ICD-10-CM

## 2023-10-20 DIAGNOSIS — I5033 Acute on chronic diastolic (congestive) heart failure: Secondary | ICD-10-CM | POA: Diagnosis not present

## 2023-10-20 DIAGNOSIS — D649 Anemia, unspecified: Secondary | ICD-10-CM | POA: Insufficient documentation

## 2023-10-20 LAB — BASIC METABOLIC PANEL
Anion gap: 7 (ref 5–15)
BUN: 14 mg/dL (ref 8–23)
CO2: 25 mmol/L (ref 22–32)
Calcium: 8 mg/dL — ABNORMAL LOW (ref 8.9–10.3)
Chloride: 106 mmol/L (ref 98–111)
Creatinine, Ser: 0.72 mg/dL (ref 0.44–1.00)
GFR, Estimated: >60 mL/min (ref >60)
Glucose, Bld: 97 mg/dL (ref 70–99)
Potassium: 2.9 mmol/L — ABNORMAL LOW (ref 3.5–5.1)
Sodium: 138 mmol/L (ref 135–145)

## 2023-10-20 LAB — CBC
HCT: 25.4 % — ABNORMAL LOW (ref 36.0–46.0)
Hemoglobin: 7.5 g/dL — ABNORMAL LOW (ref 12.0–15.0)
MCH: 20.1 pg — ABNORMAL LOW (ref 26.0–34.0)
MCHC: 29.5 g/dL — ABNORMAL LOW (ref 30.0–36.0)
MCV: 68.1 fL — ABNORMAL LOW (ref 80.0–100.0)
Platelets: 217 10*3/uL (ref 150–400)
RBC: 3.73 MIL/uL — ABNORMAL LOW (ref 3.87–5.11)
RDW: 23.5 % — ABNORMAL HIGH (ref 11.5–15.5)
WBC: 5.9 10*3/uL (ref 4.0–10.5)
nRBC: 0 % (ref 0.0–0.2)

## 2023-10-20 LAB — RETICULOCYTES
Immature Retic Fract: 30 % — ABNORMAL HIGH (ref 2.3–15.9)
RBC.: 3.89 MIL/uL (ref 3.87–5.11)
Retic Count, Absolute: 37.7 10*3/uL (ref 19.0–186.0)
Retic Ct Pct: 1 % (ref 0.4–3.1)

## 2023-10-20 LAB — VITAMIN B12: Vitamin B-12: 190 pg/mL (ref 180–914)

## 2023-10-20 LAB — IRON AND TIBC
Iron: 15 ug/dL — ABNORMAL LOW (ref 28–170)
Saturation Ratios: 4 % — ABNORMAL LOW (ref 10.4–31.8)
TIBC: 409 ug/dL (ref 250–450)
UIBC: 394 ug/dL

## 2023-10-20 LAB — FERRITIN: Ferritin: 5 ng/mL — ABNORMAL LOW (ref 11–307)

## 2023-10-20 LAB — FOLATE: Folate: 8.3 ng/mL (ref >5.9)

## 2023-10-20 LAB — MAGNESIUM: Magnesium: 1.8 mg/dL (ref 1.7–2.4)

## 2023-10-20 MED ORDER — POTASSIUM CHLORIDE CRYS ER 20 MEQ PO TBCR
40.0000 meq | EXTENDED_RELEASE_TABLET | ORAL | Status: AC
  Administered 2023-10-20 (×2): 40 meq via ORAL
  Filled 2023-10-20 (×2): qty 2

## 2023-10-20 MED ORDER — DROPERIDOL 2.5 MG/ML IJ SOLN: 1.2500 mg | Freq: Once | INTRAMUSCULAR | Status: DC

## 2023-10-20 MED ORDER — FUROSEMIDE 10 MG/ML IJ SOLN
40.0000 mg | Freq: Two times a day (BID) | INTRAMUSCULAR | Status: DC
  Administered 2023-10-20 – 2023-10-22 (×5): 40 mg via INTRAVENOUS
  Filled 2023-10-20 (×5): qty 4

## 2023-10-20 NOTE — Progress Notes (Addendum)
  [facility] Progress Note  Patient: [name]     WUJ:[id number]  DOA: 10/19/2023   PCP: Henderson Cloud, MD       Brief hospital course: 83 y.o. female with show chronic diastolic CHF, complete heart block status post pacemaker placement, paroxysmal atrial fibrillation, COPD/asthma, chronic anemia, history of colon cancer s/p resection in August 2024 presents to the ER because of increasing exertional dyspnea and lower extremity edema worsening last 2 weeks.   Subjective:  Has mild dyspnea and some leg swelling.   Assessment and Plan: Principal Problem:   Acute on chronic diastolic CHF (congestive heart failure) (HCC) - Cont Lasix IV Q 12  Active Problems: PAF - cont Eliquis - not on rate controlling agent  Hypokalemia - K 2.9- Mg normal - replaced and follow  Microcytic anemia  - Iron deficiency noted - start oral Iron BID - check hemoccult    Complete heart block  - has pacer  COPD - no flare noted - takes Symbicort as outpt - cont inhalers  H/o colon cancer - s/p resection      Code Status: Full Code Total time on patient care: 35 min DVT prophylaxis:   apixaban (ELIQUIS) tablet 5 mg     Objective:   Vitals:   10/20/23 0600 10/20/23 0644 10/20/23 0830 10/20/23 0900  BP: 117/61  125/60 119/65  Pulse: 70  70 70  Resp: 15  16 16   Temp:  97.9 F (36.6 C)  97.7 F (36.5 C)  TempSrc:  Oral  Oral  SpO2: 100%  100% 100%   There were no vitals filed for this visit. Exam: General exam: Appears comfortable  HEENT: oral mucosa moist Respiratory system: mild crackles at bases  Cardiovascular system: S1 & S2 heard  Gastrointestinal system: Abdomen soft, non-tender, nondistended. Normal bowel sounds   Extremities: No cyanosis, clubbing - 3+ edema Psychiatry:  Mood & affect appropriate.      CBC: Recent Labs  Lab 10/19/23 1813 10/20/23 0441  WBC 6.8 5.9  NEUTROABS 4.5  --   HGB 8.3* 7.5*  HCT 30.0* 25.4*  MCV 70.9* 68.1*   PLT 254 217   Basic Metabolic Panel: Recent Labs  Lab 10/19/23 1813 10/20/23 0441  NA 139 138  K 3.1* 2.9*  CL 107 106  CO2 22 25  GLUCOSE 100* 97  BUN 16 14  CREATININE 1.05* 0.72  CALCIUM 8.2* 8.0*  MG 1.8 1.8     Scheduled Meds:  apixaban  5 mg Oral BID   atorvastatin  20 mg Oral QHS   furosemide  40 mg Intravenous Q12H   mometasone-formoterol  2 puff Inhalation BID   montelukast  10 mg Oral QHS    Imaging and lab data personally reviewed   Author: Calvert Cantor  10/20/2023 12:24 PM  To contact [facility]>   Check the care team in [facility] and look for the attending/consulting [facility] provider listed  Log into [email or web address] and use [place]'s universal password   Go to> "[facility]"  and find provider  If you still have difficulty reaching the provider, please page the [facility] (Director on Call) for the Hospitalists listed on amion

## 2023-10-20 NOTE — Plan of Care (Signed)

## 2023-10-20 NOTE — Progress Notes (Signed)
 Bilateral lower extremity venous duplex has been completed. Preliminary results can be found in CV Proc through chart review.   10/20/23 2:37 PM Olen Cordial RVT

## 2023-10-21 DIAGNOSIS — I5033 Acute on chronic diastolic (congestive) heart failure: Secondary | ICD-10-CM | POA: Diagnosis not present

## 2023-10-21 LAB — BASIC METABOLIC PANEL
Anion gap: 7 (ref 5–15)
BUN: 15 mg/dL (ref 8–23)
CO2: 24 mmol/L (ref 22–32)
Calcium: 7.7 mg/dL — ABNORMAL LOW (ref 8.9–10.3)
Chloride: 106 mmol/L (ref 98–111)
Creatinine, Ser: 0.96 mg/dL (ref 0.44–1.00)
GFR, Estimated: 58 mL/min — ABNORMAL LOW (ref >60)
Glucose, Bld: 100 mg/dL — ABNORMAL HIGH (ref 70–99)
Potassium: 3.8 mmol/L (ref 3.5–5.1)
Sodium: 137 mmol/L (ref 135–145)

## 2023-10-21 MED ORDER — FERROUS GLUCONATE 324 (38 FE) MG PO TABS: 324.0000 mg | ORAL_TABLET | Freq: Two times a day (BID) | ORAL | 0 refills | Status: AC

## 2023-10-21 NOTE — Discharge Instructions (Signed)
 Your Iron level is low. Please take the prescribed Iron tabs 2 x day. Please keep legs and feet elevated as much as possible and wear the compression stockings during the daytime.   You were cared for by a hospitalist during your hospital stay. Please review all of you discharge paperwork on the day of discharge and be sure you have all of your prescribed medications and please read the below instructions:  Once you are discharged, your primary care physician will handle any further medical issues. Please note that NO REFILLS for any discharge medications will be authorized once you are discharged as it is imperative that you return to your primary care physician (or establish a relationship with a primary care physician if you do not have one) for your aftercare needs. Please obtain a follow up appointment with your primary care physician within 1-2 weeks of discharge. Please take all your medications with you for your next visit with your Primary MD. Please request your Primary MD to go over all Hospital Tests and Procedures, Radiological results at the follow up appointment. In some cases, there will be blood work, cultures and biopsy results pending at the time of your discharge. Please request that your primary care M.D. goes through all the records of your hospital data and follows up on these results. Please get all hospital records sent to your primary MD by signing hospital release before you go home or request your primary care doctor's office to assist with obtaining medical records.   You must read complete instructions/literature along with all the possible adverse reactions/side effects for all the medicines that have been prescribed to you. Take any new medicines after you have completely understood and accpet all the possible adverse reactions/side effects.  Please take medications as prescribed and speak with your doctor if changes are needed.   If you have smoked or chewed tobacco in  the last 2 yrs please stop. Stop any regular alcohol  and or any recreational drug use. Wear Seat belts while driving.   If you had Pneumonia at the Hospital: Please get a 2 view Chest X ray done in 6-8 weeks after hospital discharge or sooner if instructed by your Primary MD.   If you have Congestive Heart Failure: Follow a cardiac low salt diet and 1.5 lit/day fluid restriction. Please call your Cardiologist or Primary MD anytime you have any of the following symptoms:  1) 3 pound weight gain in 24 hours or 5 pounds in 1 week  2) shortness of breath, with or without a dry hacking cough  3) increasing swelling in the feet or stomach  4) if you have to sleep on extra pillows at night in order to breathe   If you have Diabetes: Check blood sugars 4 times/day- once on AM empty stomach and then before each meal. Log in all results and show them to your primary doctor at your next visit. If glucose readings are often under 60 or above 400 call your primary MD to see if medication dosages need to be adjusted   If you have Syncope (passing out) or Seizure/Convulsions/Epilepsy: Please do not drive, operate heavy machinery, participate in activities at heights or participate in high speed sports until you have seen by Primary MD or a Neurologist and advised to do so again. Per [facility] statutes, patients with seizures are not allowed to drive until they have been seizure-free for six months.  Use caution when using heavy equipment or power tools.  Avoid working on ladders or at heights. Take showers instead of baths. Ensure the water temperature is not too high on the home water heater. Do not go swimming alone. Do not lock yourself in a room alone (i.e. bathroom). When caring for infants or small children, sit down when holding, feeding, or changing them to minimize risk of injury to the child in the event you have a seizure. Maintain good sleep hygiene. Avoid alcohol.    If you had  Gastrointestinal Bleeding: Please ask your Primary MD to check a complete blood count within one week of discharge or at your next visit. Your endoscopic/colonoscopic biopsies that are pending at the time of discharge will also need to followed by your Primary MD.    Bonita Quin can reach the hospitalist office at phone [phone] or fax [phone]   If you do not have a primary care physician, you can call [phone] for a physician referral.

## 2023-10-21 NOTE — Care Management Obs Status (Signed)
 MEDICARE OBSERVATION STATUS NOTIFICATION   Patient Details  Name: Sadeel Fiddler MRN: [id number] Date of Birth: 1940/12/27   Medicare Observation Status Notification Given:  Yes    Larrie Kass, LCSW 10/21/2023, 8:51 AM

## 2023-10-21 NOTE — Discharge Summary (Signed)
 Physician Discharge Summary  [name] HYQ:[id number] DOB: 1940/11/22 DOA: 10/19/2023  PCP: Philip Aspen, Limmie Patricia, MD  Admit date: 10/19/2023 Discharge date: 10/21/2023 Discharging to: home Recommendations for Outpatient Follow-up:  Recommend outpatient stool hemoccults and or a GI evaluation for microcytic anemia     Discharge Diagnoses:   Principal Problem:   Acute on chronic diastolic CHF (congestive heart failure) (HCC) Active Problems:   Essential hypertension   Tachy-brady syndrome (HCC)   GERD (gastroesophageal reflux disease)   Mild persistent asthma   Complete heart block (HCC)   Colon cancer (HCC)   Acute CHF (congestive heart failure) (HCC)   Anemia     Brief hospital course: 83 y.o. female with show chronic diastolic CHF, complete heart block status post pacemaker placement, paroxysmal atrial fibrillation, COPD/asthma, chronic anemia, history of colon cancer s/p resection in August 2024 presents to the ER because of increasing exertional dyspnea and lower extremity edema worsening last 2 weeks.    Subjective:  Has improvement in pedal edema. She has no dyspnea on exertion but is very weak and not able to walk more than 50 ft.    Assessment and Plan: Principal Problem:   Acute on chronic diastolic CHF (congestive heart failure) (HCC) Severe pedal edema- possible lymphedema - treated with Lasix IV Q 12 - 2 D ECHO shows grade 1 diastolic dysfunction and aortic sclerosis - ok to switch back to oral Lasix which she is supposed to take PRN - start TEDS for severe LE edema   Active Problems: PAF - cont Eliquis - not on rate controlling agent   Hypokalemia - K 2.9- Mg normal - replaced    Microcytic anemia (h/o colon cancer) - Iron deficiency noted - started oral Iron BID -  hemoccult will need to be followed up as outpt- may need a follow up colonoscopy due to h/o colon cancer     Complete heart block  - has pacer   COPD - no flare noted - takes  Symbicort as outpt - cont inhalers   H/o colon cancer - s/p resection      Discharge Instructions  Discharge Instructions     Diet - low sodium heart healthy   Complete by: As directed    Increase activity slowly   Complete by: As directed       Allergies as of 10/21/2023       Reactions   Albuterol Palpitations, Other (See Comments)   Irregular heartbeat        Medication List     TAKE these medications    acetaminophen 325 MG tablet Commonly known as: TYLENOL Take 2 tablets (650 mg total) by mouth every 6 (six) hours as needed for mild pain (or Fever >/= 101).   albuterol 108 (90 Base) MCG/ACT inhaler Commonly known as: Ventolin HFA Inhale 2 puffs into the lungs every 4 (four) hours as needed. What changed: reasons to take this   atorvastatin 20 MG tablet Commonly known as: LIPITOR TAKE 1 TABLET EVERY DAY What changed: when to take this   bisacodyl 5 MG EC tablet Commonly known as: DULCOLAX Take 1 tablet (5 mg total) by mouth daily as needed for moderate constipation.   docusate sodium 100 MG capsule Commonly known as: COLACE Take 1 capsule (100 mg total) by mouth 2 (two) times daily.   Eliquis 5 MG Tabs tablet Generic drug: apixaban TAKE 1 TABLET TWICE DAILY   Ensure Original Liqd Take 237 mLs by mouth daily as needed (  when the appetite is lacking).   feeding supplement Liqd Take 237 mLs by mouth 3 (three) times daily between meals.   ferrous gluconate 324 MG tablet Commonly known as: FERGON Take 1 tablet (324 mg total) by mouth 2 (two) times daily with a meal.   furosemide 20 MG tablet Commonly known as: Lasix Take 1 tablet (20 mg total) by mouth daily as needed for fluid or edema.   montelukast 10 MG tablet Commonly known as: SINGULAIR TAKE 1 TABLET AT BEDTIME   omeprazole 20 MG capsule Commonly known as: PRILOSEC TAKE 1 CAPSULE EVERY DAY 30 TO 60 MINUTES BEFORE FIRST MEAL OF THE DAY What changed: See the new instructions.    Symbicort 80-4.5 MCG/ACT inhaler Generic drug: budesonide-formoterol INHALE 2 PUFFS INTO THE LUNGS 2 (TWO) TIMES DAILY.        Follow-up Information     Philip Aspen, Limmie Patricia, MD Follow up in 1 week(s).   Specialty: Internal Medicine Contact information: [address] [place] [address] [phone]                    The results of significant diagnostics from this hospitalization (including imaging, microbiology, ancillary and laboratory) are listed below for reference.    VAS [place] LOWER EXTREMITY VENOUS (DVT) Result Date: 10/20/2023  Lower Venous DVT Study Patient Name:  [name] Luisa Dago  Date of Exam:   10/20/2023 Medical Rec #: [id number]      Accession #:    [id number] Date of Birth: 02/02/1941       Patient Gender: F Patient Age:   83 years Exam Location:  [facility] Procedure:      VAS [place] LOWER EXTREMITY VENOUS (DVT) Referring Phys: Midge Minium --------------------------------------------------------------------------------  Indications: Edema.  Risk Factors: None identified. Limitations: Body habitus, poor ultrasound/tissue interface and patient pain tolerance, patient movement. Comparison Study: No prior studies. Performing Technologist: Chanda Busing RVT  Examination Guidelines: A complete evaluation includes B-mode imaging, spectral Doppler, color Doppler, and power Doppler as needed of all accessible portions of each vessel. Bilateral testing is considered an integral part of a complete examination. Limited examinations for reoccurring indications may be performed as noted. The reflux portion of the exam is performed with the patient in reverse Trendelenburg.  +---------+---------------+---------+-----------+----------+-------------------+ RIGHT    CompressibilityPhasicitySpontaneityPropertiesThrombus Aging      +---------+---------------+---------+-----------+----------+-------------------+ CFV      Full           Yes      Yes                                       +---------+---------------+---------+-----------+----------+-------------------+ SFJ      Full                                                             +---------+---------------+---------+-----------+----------+-------------------+ FV Prox  Full                                                             +---------+---------------+---------+-----------+----------+-------------------+  FV Mid                  Yes      Yes                                      +---------+---------------+---------+-----------+----------+-------------------+ FV Distal               Yes      Yes                                      +---------+---------------+---------+-----------+----------+-------------------+ PFV      Full                                                             +---------+---------------+---------+-----------+----------+-------------------+ POP      Full           Yes      Yes                                      +---------+---------------+---------+-----------+----------+-------------------+ PTV      Full                                                             +---------+---------------+---------+-----------+----------+-------------------+ PERO                                                  Not well visualized +---------+---------------+---------+-----------+----------+-------------------+   +---------+---------------+---------+-----------+----------+-------------------+ LEFT     CompressibilityPhasicitySpontaneityPropertiesThrombus Aging      +---------+---------------+---------+-----------+----------+-------------------+ CFV      Full           Yes      Yes                                      +---------+---------------+---------+-----------+----------+-------------------+ SFJ      Full                                                              +---------+---------------+---------+-----------+----------+-------------------+ FV Prox  Full                                                             +---------+---------------+---------+-----------+----------+-------------------+ FV Mid  Yes      Yes                                      +---------+---------------+---------+-----------+----------+-------------------+ FV Distal               Yes      Yes                                      +---------+---------------+---------+-----------+----------+-------------------+ PFV      Full                                                             +---------+---------------+---------+-----------+----------+-------------------+ POP      Full           Yes      Yes                                      +---------+---------------+---------+-----------+----------+-------------------+ PTV                                                   Not well visualized +---------+---------------+---------+-----------+----------+-------------------+ PERO                                                  Not well visualized +---------+---------------+---------+-----------+----------+-------------------+     *See table(s) above for measurements and observations.    Preliminary    DG Chest Port 1 View Result Date: 10/19/2023 CLINICAL DATA:  Shortness of breath EXAM: PORTABLE CHEST 1 VIEW COMPARISON:  03/20/2023 FINDINGS: Stable cardiomediastinal silhouette. Left chest wall pacemaker. Chronic retrocardiac atelectasis/scarring. Otherwise no focal consolidation, pleural effusion, or pneumothorax. No displaced rib fractures. IMPRESSION: No active disease. Electronically Signed   By: Minerva Fester M.D.   On: 10/19/2023 18:57   Labs:   Basic Metabolic Panel: Recent Labs  Lab 10/19/23 1813 10/20/23 0441 10/21/23 0351  NA 139 138 137  K 3.1* 2.9* 3.8  CL 107 106 106  CO2 22 25 24   GLUCOSE 100* 97 100*  BUN 16 14  15   CREATININE 1.05* 0.72 0.96  CALCIUM 8.2* 8.0* 7.7*  MG 1.8 1.8  --      CBC: Recent Labs  Lab 10/19/23 1813 10/20/23 0441  WBC 6.8 5.9  NEUTROABS 4.5  --   HGB 8.3* 7.5*  HCT 30.0* 25.4*  MCV 70.9* 68.1*  PLT 254 217         SIGNED:   Calvert Cantor, MD  [facility] 10/21/2023, 11:27 AM Time taking on discharge: 50 minutes

## 2023-10-21 NOTE — TOC CM/SW Note (Addendum)
 Transition of Care [facility]) - Inpatient Brief Assessment   Patient Details  Name: [name] MRN: [id number] Date of Birth: 1941/03/03  [facility]) CM/SW Contact:    Larrie Kass, LCSW Phone Number: 10/21/2023, 10:18 AM   Clinical Narrative:  CSW spoke with the pt. She reported that her son is working on arranging extra care at home to assist her. She stated that she does not need any additional resources for PCS. She has no DME needs at this time. She mentioned that she may need transportation assistance at the time of d/c.  Adden  1:30pm MD placed a recommendation for home health services. CSW spoke with the patient's son, Shon Hale, who has agreed and has no preference for the agency. Pt's son stated his brother will pick pt up upon d/c.  [facility] was able to accept pt for HHPT/OT/RN/Aide. TOC sign off.    Transition of Care Asessment: Insurance and Status: Insurance coverage has been reviewed Patient has primary care physician: Yes Home environment has been reviewed: home with self Prior level of function:: mod indpendent Prior/Current Home Services: No current home services Social Drivers of Health Review: SDOH reviewed no interventions necessary Readmission risk has been reviewed: Yes Transition of care needs: no transition of care needs at this time

## 2023-10-22 DIAGNOSIS — I5033 Acute on chronic diastolic (congestive) heart failure: Secondary | ICD-10-CM | POA: Diagnosis not present

## 2023-10-22 LAB — BASIC METABOLIC PANEL
Anion gap: 7 (ref 5–15)
BUN: 17 mg/dL (ref 8–23)
CO2: 27 mmol/L (ref 22–32)
Calcium: 8 mg/dL — ABNORMAL LOW (ref 8.9–10.3)
Chloride: 105 mmol/L (ref 98–111)
Creatinine, Ser: 0.91 mg/dL (ref 0.44–1.00)
GFR, Estimated: >60 mL/min (ref >60)
Glucose, Bld: 94 mg/dL (ref 70–99)
Potassium: 3.6 mmol/L (ref 3.5–5.1)
Sodium: 139 mmol/L (ref 135–145)

## 2023-10-22 NOTE — Progress Notes (Signed)
 Heart Failure Navigator Progress Note  Assessed for Heart & Vascular TOC clinic readiness.  Patient does not meet criteria due to EF 55-60%, per MD note patient has a follow up in 1 week with Internal medicine. Currently being discharged.   Navigator will sign off at this time.   Rhae Hammock, BSN, Scientist, clinical (histocompatibility and immunogenetics) Only

## 2023-10-22 NOTE — Progress Notes (Signed)
 [facility] Progress Note  Patient: [name]     NUU:[id number]  DOA: 10/19/2023   PCP: Henderson Cloud, MD       Brief hospital course: 83 y.o. female with show chronic diastolic CHF, complete heart block status post pacemaker placement, paroxysmal atrial fibrillation, COPD/asthma, chronic anemia, history of colon cancer s/p resection in August 2024 presents to the ER because of increasing exertional dyspnea and lower extremity edema worsening last 2 weeks.   Subjective:  No dyspnea. Pedal edema improving.  Assessment and Plan: Principal Problem:   Acute on chronic diastolic CHF (congestive heart failure) (HCC) - Cont Lasix IV Q 12 - ok to dc to home  Active Problems: PAF - cont Eliquis - not on rate controlling agent  Hypokalemia - K 2.9- Mg normal - replaced    Microcytic anemia  - Iron deficiency noted - start oral Iron BID - will need hemoccult and colonoscopy as outpt    Complete heart block  - has pacer  COPD - no flare noted - takes Symbicort as outpt - cont inhalers  H/o colon cancer - s/p resection      Code Status: Full Code Total time on patient care: 35 min DVT prophylaxis:  Place TED hose Start: 10/21/23 1123 apixaban (ELIQUIS) tablet 5 mg     Objective:   Vitals:   10/21/23 2042 10/22/23 0449 10/22/23 0500 10/22/23 0833  BP: (!) 141/73 (!) 127/57    Pulse: 69 (!) 52    Resp: 19 20    Temp: 98.4 F (36.9 C) 98.1 F (36.7 C)    TempSrc: Oral Oral    SpO2: 100% 100%  96%  Weight:   94.9 kg   Height:       Filed Weights   10/20/23 1900 10/21/23 0449 10/22/23 0500  Weight: 97 kg 94.6 kg 94.9 kg   Exam: General exam: Appears comfortable  HEENT: oral mucosa moist Respiratory system: mild crackles at bases  Cardiovascular system: S1 & S2 heard  Gastrointestinal system: Abdomen soft, non-tender, nondistended. Normal bowel sounds   Extremities: No cyanosis, clubbing - 3+ edema Psychiatry:  Mood & affect  appropriate.      CBC: Recent Labs  Lab 10/19/23 1813 10/20/23 0441  WBC 6.8 5.9  NEUTROABS 4.5  --   HGB 8.3* 7.5*  HCT 30.0* 25.4*  MCV 70.9* 68.1*  PLT 254 217   Basic Metabolic Panel: Recent Labs  Lab 10/19/23 1813 10/20/23 0441 10/21/23 0351 10/22/23 0337  NA 139 138 137 139  K 3.1* 2.9* 3.8 3.6  CL 107 106 106 105  CO2 22 25 24 27   GLUCOSE 100* 97 100* 94  BUN 16 14 15 17   CREATININE 1.05* 0.72 0.96 0.91  CALCIUM 8.2* 8.0* 7.7* 8.0*  MG 1.8 1.8  --   --      Scheduled Meds:  apixaban  5 mg Oral BID   atorvastatin  20 mg Oral QHS   furosemide  40 mg Intravenous Q12H   mometasone-formoterol  2 puff Inhalation BID   montelukast  10 mg Oral QHS    Imaging and lab data personally reviewed   Author: Calvert Cantor  10/22/2023 9:15 AM  To contact [facility]>   Check the care team in [facility] and look for the attending/consulting [facility] provider listed  Log into [email or web address] and use Susan Moore's universal password   Go to> "[facility]"  and find provider  If you still have difficulty reaching the  provider, please page the [facility] (Director on Call) for the Hospitalists listed on amion

## 2023-10-22 NOTE — Plan of Care (Signed)
  Problem: Clinical Measurements: Goal: Ability to maintain clinical measurements within normal limits will improve Outcome: Progressing Goal: Diagnostic test results will improve Outcome: Progressing Goal: Respiratory complications will improve Outcome: Progressing Goal: Cardiovascular complication will be avoided Outcome: Progressing   Problem: Activity: Goal: Risk for activity intolerance will decrease Outcome: Progressing   Problem: Elimination: Goal: Will not experience complications related to urinary retention Outcome: Progressing   Problem: Safety: Goal: Ability to remain free from injury will improve Outcome: Progressing

## 2023-10-30 ENCOUNTER — Telehealth: Payer: Self-pay

## 2023-10-30 DIAGNOSIS — J439 Emphysema, unspecified: Secondary | ICD-10-CM | POA: Diagnosis not present

## 2023-10-30 DIAGNOSIS — I110 Hypertensive heart disease with heart failure: Secondary | ICD-10-CM | POA: Diagnosis not present

## 2023-10-30 DIAGNOSIS — J4530 Mild persistent asthma, uncomplicated: Secondary | ICD-10-CM | POA: Diagnosis not present

## 2023-10-30 DIAGNOSIS — D630 Anemia in neoplastic disease: Secondary | ICD-10-CM | POA: Diagnosis not present

## 2023-10-30 DIAGNOSIS — I442 Atrioventricular block, complete: Secondary | ICD-10-CM | POA: Diagnosis not present

## 2023-10-30 DIAGNOSIS — I480 Paroxysmal atrial fibrillation: Secondary | ICD-10-CM | POA: Diagnosis not present

## 2023-10-30 DIAGNOSIS — I5033 Acute on chronic diastolic (congestive) heart failure: Secondary | ICD-10-CM | POA: Diagnosis not present

## 2023-10-30 DIAGNOSIS — C189 Malignant neoplasm of colon, unspecified: Secondary | ICD-10-CM | POA: Diagnosis not present

## 2023-10-30 DIAGNOSIS — I495 Sick sinus syndrome: Secondary | ICD-10-CM | POA: Diagnosis not present

## 2023-10-30 NOTE — Telephone Encounter (Signed)
 Copied from CRM [id number]. Topic: Clinical - Home Health Verbal Orders >> Oct 30, 2023 12:54 PM Turkey A wrote: Caller/Agency: Trey Paula with [facility] Callback Number: [phone] Service Requested: Skilled Nursing Frequency: 1x week for 4 weeks, then every other week for 4 weeks Any new concerns about the patient? No Disease Management Congestive Health

## 2023-10-30 NOTE — Telephone Encounter (Signed)
 Verbal orders given to [facility] with [facility]

## 2023-11-02 ENCOUNTER — Telehealth: Payer: Self-pay

## 2023-11-02 NOTE — Telephone Encounter (Signed)
 Copied from CRM [id number]. Topic: Clinical - Home Health Verbal Orders >> Nov 02, 2023  2:29 PM Sonny Dandy B wrote: Caller/Agency: [facility] Callback [phone] Service Requested: Physical Therapy Frequency: called to advise pt's granduaghter cancel the pt evaluation, needs to move it to  next week.  Any new concerns about the patient? Yes

## 2023-11-05 DIAGNOSIS — I495 Sick sinus syndrome: Secondary | ICD-10-CM | POA: Diagnosis not present

## 2023-11-05 DIAGNOSIS — I110 Hypertensive heart disease with heart failure: Secondary | ICD-10-CM | POA: Diagnosis not present

## 2023-11-05 DIAGNOSIS — I442 Atrioventricular block, complete: Secondary | ICD-10-CM | POA: Diagnosis not present

## 2023-11-05 DIAGNOSIS — I480 Paroxysmal atrial fibrillation: Secondary | ICD-10-CM | POA: Diagnosis not present

## 2023-11-05 DIAGNOSIS — D630 Anemia in neoplastic disease: Secondary | ICD-10-CM | POA: Diagnosis not present

## 2023-11-05 DIAGNOSIS — C189 Malignant neoplasm of colon, unspecified: Secondary | ICD-10-CM | POA: Diagnosis not present

## 2023-11-05 DIAGNOSIS — J4530 Mild persistent asthma, uncomplicated: Secondary | ICD-10-CM | POA: Diagnosis not present

## 2023-11-05 DIAGNOSIS — I5033 Acute on chronic diastolic (congestive) heart failure: Secondary | ICD-10-CM | POA: Diagnosis not present

## 2023-11-05 DIAGNOSIS — J439 Emphysema, unspecified: Secondary | ICD-10-CM | POA: Diagnosis not present

## 2023-11-06 ENCOUNTER — Telehealth: Payer: Self-pay

## 2023-11-06 DIAGNOSIS — I110 Hypertensive heart disease with heart failure: Secondary | ICD-10-CM | POA: Diagnosis not present

## 2023-11-06 DIAGNOSIS — J439 Emphysema, unspecified: Secondary | ICD-10-CM | POA: Diagnosis not present

## 2023-11-06 DIAGNOSIS — J4530 Mild persistent asthma, uncomplicated: Secondary | ICD-10-CM | POA: Diagnosis not present

## 2023-11-06 DIAGNOSIS — I5033 Acute on chronic diastolic (congestive) heart failure: Secondary | ICD-10-CM | POA: Diagnosis not present

## 2023-11-06 DIAGNOSIS — I495 Sick sinus syndrome: Secondary | ICD-10-CM | POA: Diagnosis not present

## 2023-11-06 DIAGNOSIS — D630 Anemia in neoplastic disease: Secondary | ICD-10-CM | POA: Diagnosis not present

## 2023-11-06 DIAGNOSIS — C189 Malignant neoplasm of colon, unspecified: Secondary | ICD-10-CM | POA: Diagnosis not present

## 2023-11-06 DIAGNOSIS — I480 Paroxysmal atrial fibrillation: Secondary | ICD-10-CM | POA: Diagnosis not present

## 2023-11-06 DIAGNOSIS — I442 Atrioventricular block, complete: Secondary | ICD-10-CM | POA: Diagnosis not present

## 2023-11-06 NOTE — Telephone Encounter (Signed)
 Copied from CRM [id number]. Topic: General - Other >> Nov 06, 2023  3:08 PM Rodman Pickle T wrote: Reason for CRM: kate white from  [facility] is needing verbal orders once a week for five weeks cb  [phone]

## 2023-11-07 NOTE — Telephone Encounter (Signed)
 Left detailed message on machine for [name] from [facility] with verbal orders for PT.

## 2023-11-13 DIAGNOSIS — I495 Sick sinus syndrome: Secondary | ICD-10-CM | POA: Diagnosis not present

## 2023-11-13 DIAGNOSIS — I480 Paroxysmal atrial fibrillation: Secondary | ICD-10-CM | POA: Diagnosis not present

## 2023-11-13 DIAGNOSIS — J439 Emphysema, unspecified: Secondary | ICD-10-CM | POA: Diagnosis not present

## 2023-11-13 DIAGNOSIS — I442 Atrioventricular block, complete: Secondary | ICD-10-CM | POA: Diagnosis not present

## 2023-11-13 DIAGNOSIS — I5033 Acute on chronic diastolic (congestive) heart failure: Secondary | ICD-10-CM | POA: Diagnosis not present

## 2023-11-13 DIAGNOSIS — C189 Malignant neoplasm of colon, unspecified: Secondary | ICD-10-CM | POA: Diagnosis not present

## 2023-11-13 DIAGNOSIS — I110 Hypertensive heart disease with heart failure: Secondary | ICD-10-CM | POA: Diagnosis not present

## 2023-11-13 DIAGNOSIS — D630 Anemia in neoplastic disease: Secondary | ICD-10-CM | POA: Diagnosis not present

## 2023-11-13 DIAGNOSIS — J4530 Mild persistent asthma, uncomplicated: Secondary | ICD-10-CM | POA: Diagnosis not present

## 2023-11-14 ENCOUNTER — Other Ambulatory Visit: Payer: Self-pay | Admitting: Internal Medicine

## 2023-11-14 DIAGNOSIS — J4530 Mild persistent asthma, uncomplicated: Secondary | ICD-10-CM | POA: Diagnosis not present

## 2023-11-14 DIAGNOSIS — I5033 Acute on chronic diastolic (congestive) heart failure: Secondary | ICD-10-CM | POA: Diagnosis not present

## 2023-11-14 DIAGNOSIS — I110 Hypertensive heart disease with heart failure: Secondary | ICD-10-CM | POA: Diagnosis not present

## 2023-11-14 DIAGNOSIS — I442 Atrioventricular block, complete: Secondary | ICD-10-CM | POA: Diagnosis not present

## 2023-11-14 DIAGNOSIS — J439 Emphysema, unspecified: Secondary | ICD-10-CM | POA: Diagnosis not present

## 2023-11-14 DIAGNOSIS — C189 Malignant neoplasm of colon, unspecified: Secondary | ICD-10-CM | POA: Diagnosis not present

## 2023-11-14 DIAGNOSIS — I495 Sick sinus syndrome: Secondary | ICD-10-CM | POA: Diagnosis not present

## 2023-11-14 DIAGNOSIS — I480 Paroxysmal atrial fibrillation: Secondary | ICD-10-CM | POA: Diagnosis not present

## 2023-11-14 DIAGNOSIS — D630 Anemia in neoplastic disease: Secondary | ICD-10-CM | POA: Diagnosis not present

## 2023-11-20 ENCOUNTER — Telehealth: Payer: Self-pay | Admitting: *Deleted

## 2023-11-20 NOTE — Telephone Encounter (Signed)
 Spoke to the son and he states that the patient's weight is up 4 pounds in a week.  He would like to know if we should increase her Lasix for a few days?  PCP is not in the office.  Please advise.

## 2023-11-20 NOTE — Telephone Encounter (Signed)
 Copied from CRM [id number]. Topic: Clinical - Medication Question >> Nov 19, 2023  5:27 PM Jethro Morrison wrote: Reason for CRM: PT SON LEON Stanko [phone] CALLING IN REGARDS TO MEDICATION FOR HIS MOM AND IS REQUESTING TO SPEAK WITH DR. Ival Marines OR THE NURSE. FORWARDING TO CLINIC.

## 2023-11-21 ENCOUNTER — Telehealth: Payer: Self-pay

## 2023-11-21 DIAGNOSIS — J439 Emphysema, unspecified: Secondary | ICD-10-CM | POA: Diagnosis not present

## 2023-11-21 DIAGNOSIS — I480 Paroxysmal atrial fibrillation: Secondary | ICD-10-CM | POA: Diagnosis not present

## 2023-11-21 DIAGNOSIS — I495 Sick sinus syndrome: Secondary | ICD-10-CM | POA: Diagnosis not present

## 2023-11-21 DIAGNOSIS — I110 Hypertensive heart disease with heart failure: Secondary | ICD-10-CM | POA: Diagnosis not present

## 2023-11-21 DIAGNOSIS — J4530 Mild persistent asthma, uncomplicated: Secondary | ICD-10-CM | POA: Diagnosis not present

## 2023-11-21 DIAGNOSIS — I442 Atrioventricular block, complete: Secondary | ICD-10-CM | POA: Diagnosis not present

## 2023-11-21 DIAGNOSIS — C189 Malignant neoplasm of colon, unspecified: Secondary | ICD-10-CM | POA: Diagnosis not present

## 2023-11-21 DIAGNOSIS — D630 Anemia in neoplastic disease: Secondary | ICD-10-CM | POA: Diagnosis not present

## 2023-11-21 DIAGNOSIS — I5033 Acute on chronic diastolic (congestive) heart failure: Secondary | ICD-10-CM | POA: Diagnosis not present

## 2023-11-21 NOTE — Telephone Encounter (Signed)
 Copied from CRM [id number]. Topic: General - Other >> Nov 21, 2023  2:36 PM Albertha Alosa wrote: Reason for CRM: Margretta Shi from [facility] called in stated patient has has a 10 pound weight gain the last week wanted to inform provider

## 2023-11-21 NOTE — Telephone Encounter (Signed)
 Given history of heart failure, would see if patient has been taking Lasix consistently over the last few days.  If not would start taking Lasix 40 mEq daily x 3 days along with K. Dur 40 mEq daily x 3 days.  For worsening symptoms proceed to nearest ED.

## 2023-11-22 MED ORDER — POTASSIUM CHLORIDE CRYS ER 20 MEQ PO TBCR: 20.0000 meq | EXTENDED_RELEASE_TABLET | Freq: Two times a day (BID) | ORAL | 0 refills | Status: DC

## 2023-11-22 NOTE — Telephone Encounter (Signed)
 I spoke with the patient's son and he confirmed patient has been taking Lasix 20 mg daily. I spoke with Dr. Arliss Lam as I was unable to find KDUR 40 meq in epic and she gave verbal ok to send KDUR 20 meq BID for 3 days. Rx sent

## 2023-11-22 NOTE — Addendum Note (Signed)
 Addended by: Aurelio Leer on: 11/22/2023 03:55 PM   Modules accepted: Orders

## 2023-11-22 NOTE — Telephone Encounter (Signed)
 After reviewing message below I informed Dr. Arliss Lam that patient was taking Lasix 20 mg daily as prescription for Potassium was already sent. Dr. Arliss Lam reported that it should be ok for patient to continue Potassium prescription and was no need to cancel prescription at pharmacy.

## 2023-11-23 DIAGNOSIS — I495 Sick sinus syndrome: Secondary | ICD-10-CM | POA: Diagnosis not present

## 2023-11-23 DIAGNOSIS — J439 Emphysema, unspecified: Secondary | ICD-10-CM | POA: Diagnosis not present

## 2023-11-23 DIAGNOSIS — I5033 Acute on chronic diastolic (congestive) heart failure: Secondary | ICD-10-CM | POA: Diagnosis not present

## 2023-11-23 DIAGNOSIS — I110 Hypertensive heart disease with heart failure: Secondary | ICD-10-CM | POA: Diagnosis not present

## 2023-11-23 DIAGNOSIS — I480 Paroxysmal atrial fibrillation: Secondary | ICD-10-CM | POA: Diagnosis not present

## 2023-11-23 DIAGNOSIS — C189 Malignant neoplasm of colon, unspecified: Secondary | ICD-10-CM | POA: Diagnosis not present

## 2023-11-23 DIAGNOSIS — I442 Atrioventricular block, complete: Secondary | ICD-10-CM | POA: Diagnosis not present

## 2023-11-23 DIAGNOSIS — J4530 Mild persistent asthma, uncomplicated: Secondary | ICD-10-CM | POA: Diagnosis not present

## 2023-11-23 DIAGNOSIS — D630 Anemia in neoplastic disease: Secondary | ICD-10-CM | POA: Diagnosis not present

## 2023-11-27 NOTE — Telephone Encounter (Signed)
I will let [name] handle this

## 2023-11-28 DIAGNOSIS — I110 Hypertensive heart disease with heart failure: Secondary | ICD-10-CM | POA: Diagnosis not present

## 2023-11-28 DIAGNOSIS — I480 Paroxysmal atrial fibrillation: Secondary | ICD-10-CM | POA: Diagnosis not present

## 2023-11-28 DIAGNOSIS — J4530 Mild persistent asthma, uncomplicated: Secondary | ICD-10-CM | POA: Diagnosis not present

## 2023-11-28 DIAGNOSIS — I442 Atrioventricular block, complete: Secondary | ICD-10-CM | POA: Diagnosis not present

## 2023-11-28 DIAGNOSIS — C189 Malignant neoplasm of colon, unspecified: Secondary | ICD-10-CM | POA: Diagnosis not present

## 2023-11-28 DIAGNOSIS — I5033 Acute on chronic diastolic (congestive) heart failure: Secondary | ICD-10-CM | POA: Diagnosis not present

## 2023-11-28 DIAGNOSIS — J439 Emphysema, unspecified: Secondary | ICD-10-CM | POA: Diagnosis not present

## 2023-11-28 DIAGNOSIS — D630 Anemia in neoplastic disease: Secondary | ICD-10-CM | POA: Diagnosis not present

## 2023-11-28 DIAGNOSIS — I495 Sick sinus syndrome: Secondary | ICD-10-CM | POA: Diagnosis not present

## 2023-11-29 ENCOUNTER — Ambulatory Visit: Payer: Self-pay

## 2023-11-29 ENCOUNTER — Ambulatory Visit (INDEPENDENT_AMBULATORY_CARE_PROVIDER_SITE_OTHER): Admitting: Family Medicine

## 2023-11-29 ENCOUNTER — Encounter: Payer: Self-pay | Admitting: Family Medicine

## 2023-11-29 VITALS — BP 112/74 | HR 70 | Temp 97.3°F | Wt 226.0 lb

## 2023-11-29 DIAGNOSIS — I442 Atrioventricular block, complete: Secondary | ICD-10-CM | POA: Diagnosis not present

## 2023-11-29 DIAGNOSIS — R748 Abnormal levels of other serum enzymes: Secondary | ICD-10-CM | POA: Diagnosis not present

## 2023-11-29 DIAGNOSIS — D630 Anemia in neoplastic disease: Secondary | ICD-10-CM | POA: Diagnosis not present

## 2023-11-29 DIAGNOSIS — J4530 Mild persistent asthma, uncomplicated: Secondary | ICD-10-CM | POA: Diagnosis not present

## 2023-11-29 DIAGNOSIS — C189 Malignant neoplasm of colon, unspecified: Secondary | ICD-10-CM | POA: Diagnosis not present

## 2023-11-29 DIAGNOSIS — I110 Hypertensive heart disease with heart failure: Secondary | ICD-10-CM | POA: Diagnosis not present

## 2023-11-29 DIAGNOSIS — J439 Emphysema, unspecified: Secondary | ICD-10-CM | POA: Diagnosis not present

## 2023-11-29 DIAGNOSIS — I495 Sick sinus syndrome: Secondary | ICD-10-CM | POA: Diagnosis not present

## 2023-11-29 DIAGNOSIS — I5032 Chronic diastolic (congestive) heart failure: Secondary | ICD-10-CM | POA: Diagnosis not present

## 2023-11-29 DIAGNOSIS — E876 Hypokalemia: Secondary | ICD-10-CM | POA: Diagnosis not present

## 2023-11-29 DIAGNOSIS — R600 Localized edema: Secondary | ICD-10-CM | POA: Diagnosis not present

## 2023-11-29 DIAGNOSIS — I5033 Acute on chronic diastolic (congestive) heart failure: Secondary | ICD-10-CM | POA: Diagnosis not present

## 2023-11-29 DIAGNOSIS — I480 Paroxysmal atrial fibrillation: Secondary | ICD-10-CM | POA: Diagnosis not present

## 2023-11-29 MED ORDER — POTASSIUM CHLORIDE CRYS ER 20 MEQ PO TBCR: 20.0000 meq | EXTENDED_RELEASE_TABLET | Freq: Two times a day (BID) | ORAL | 0 refills | Status: DC

## 2023-11-29 NOTE — Telephone Encounter (Signed)
 Chief Complaint: Leg Swelling Symptoms: weight gain, SOB with exertion Frequency: Worsening, 10 lb wt gain in 2 weeks Pertinent Negatives: Patient denies fever, CP Disposition: [] ED /[] Urgent Care (no appt availability in office) / [x] Appointment(In office/virtual)/ []  [facility]/ [] Home Care/ [] Refused Recommended Disposition /[] [facility]/ []  Follow-up with PCP Additional Notes: Pt's HH nurse Virginia  called in stating pt has had a 10 lb weight gain in the last two weeks and has had significant increase in bilateral leg swelling. HH nurse advised thorough assessment, lung sounds clear, no CP, redness. This RN spoke to pt and son, notes SOB with exertion which is not new but worsening. OV scheduled today. This RN educated pt on home care, new-worsening symptoms, when to call back/seek emergent care. Pt verbalized understanding and agrees to plan.    Copied from CRM [id number]. Topic: Clinical - Red Word Triage >> Nov 29, 2023  2:14 PM Turkey A wrote: [facility] that prompted transfer to Nurse Triage: Nurse (Virginia ) said patient has gained too much weight and does not believe Lasix  is working Reason for Disposition  SEVERE leg swelling (e.g., swelling extends above knee, entire leg is swollen, weeping fluid)  Answer Assessment - Initial Assessment Questions 1. ONSET: "When did the swelling start?" (e.g., minutes, hours, days)     Worsening 2. LOCATION: "What part of the leg is swollen?"  "Are both legs swollen or just one leg?"     Bilateral leg swelling 3. SEVERITY: "How bad is the swelling?" (e.g., localized; mild, moderate, severe)   - Localized: Small area of swelling localized to one leg.   - MILD pedal edema: Swelling limited to foot and ankle, pitting edema < 1/4 inch (6 mm) deep, rest and elevation eliminate most or all swelling.   - MODERATE edema: Swelling of lower leg to knee, pitting edema > 1/4 inch (6 mm) deep, rest and elevation only partially reduce  swelling.   - SEVERE edema: Swelling extends above knee, facial or hand swelling present.       4. REDNESS: "Does the swelling look red or infected?"     None 5. PAIN: "Is the swelling painful to touch?" If Yes, ask: "How painful is it?"   (Scale 1-10; mild, moderate or severe)     Yes 7/10 6. FEVER: "Do you have a fever?" If Yes, ask: "What is it, how was it measured, and when did it start?"  None  9. RECURRENT SYMPTOM: "Have you had leg swelling before?" If Yes, ask: "When was the last time?" "What happened that time?"     Yes, [facility] nurse believes Lasix  need adjusted  10. OTHER SYMPTOMS: "Do you have any other symptoms?" (e.g., chest pain, difficulty breathing)       SOB with exertion  Protocols used: Leg Swelling and Edema-A-AH

## 2023-11-29 NOTE — Patient Instructions (Addendum)
-  It was a pleasure to take care of you.  -Ordered labs. Office will call with lab results.  -Recommend to INCREASE Furosemide  (Lasix ). Take (1) 20mg  tablet twice a day for 3 days. Also, recommend to take Potassium Chloride  20 mEq tablet twice a day for 3 days. -If edema or shortness of breath becomes worse, or develops chest pain, follow up with the closes emergency department.  -Follow up 1 week with Dr. Ival Marines.

## 2023-11-29 NOTE — Progress Notes (Signed)
 Established Patient Office Visit   Subjective:  Patient ID: [name], female    DOB: [birth year]  Age: 83 y.o. MRN: [id number]  Chief Complaint  Patient presents with   Weight Gain   Leg Swelling    HPI Patients home health nurse called today stating patient has had a 10Ib weight gain in the last two weeks and has had significant increase in bilateral leg swelling. Also, notes SHOB with exertion, which is not new, but worsening. Patient was made this appointment for this.   Patient was hospitalized at [facility] on 03/14-03/16 for acute on chronic diastolic CHF. She didn't follow up with PCP, Dr. Ival Marines as recommended in a week. On 04/16, home health called reporting she had a 10 Ib weight gain the last week. Dr. Arliss Lam, since Dr. Ival Marines was not in office, recommended patient take Lasix  40mg  daily x 3 day and Potassium Chloride  x 3 days. Patient and patients son, whom has accompanied patient, does not think she increased medication for 3 days.  ROS See HPI above     Objective:   BP 112/74   Pulse 70   Temp (!) 97.3 F (36.3 C) (Oral)   Wt 226 lb (102.5 kg)   SpO2 93%   BMI 35.40 kg/m  Wt Readings from Last 3 Encounters:  11/29/23 226 lb (102.5 kg)  10/22/23 209 lb 3.5 oz (94.9 kg)  06/07/23 202 lb (91.6 kg)      Physical Exam Vitals reviewed.  Constitutional:      General: She is not in acute distress.    Appearance: Normal appearance. She is obese. She is not ill-appearing, toxic-appearing or diaphoretic.  HENT:     Head: Normocephalic and atraumatic.  Eyes:     General:        Right eye: No discharge.        Left eye: No discharge.     Conjunctiva/sclera: Conjunctivae normal.  Cardiovascular:     Rate and Rhythm: Normal rate and regular rhythm.     Heart sounds: Normal heart sounds. No murmur heard.    No friction rub. No gallop.  Pulmonary:     Effort: Pulmonary effort is normal. No respiratory distress.     Breath sounds: Normal breath sounds.   Musculoskeletal:        General: Normal range of motion.     Right lower leg: Edema (3+) present.     Left lower leg: Left lower leg edema: 3+.  Skin:    General: Skin is warm and dry.  Neurological:     General: No focal deficit present.     Mental Status: She is alert and oriented to person, place, and time. Mental status is at baseline.     Gait: Gait abnormal.  Psychiatric:        Mood and Affect: Mood normal.        Behavior: Behavior normal.        Thought Content: Thought content normal.        Judgment: Judgment normal.      Assessment & Plan:  Bilateral lower extremity edema -     Comprehensive metabolic panel with GFR -     Brain natriuretic peptide  Chronic diastolic heart failure (HCC) -     Comprehensive metabolic panel with GFR -     Brain natriuretic peptide -     CBC with Differential/Platelet -     COMPLETE METABOLIC PANEL WITHOUT GFR -  Potassium Chloride  Crys ER; Take 1 tablet (20 mEq total) by mouth 2 (two) times daily for 3 days.  Dispense: 6 tablet; Refill: 0  Hypokalemia -     Potassium Chloride  Crys ER; Take 1 tablet (20 mEq total) by mouth 2 (two) times daily for 3 days.  Dispense: 6 tablet; Refill: 0  -Ordered labs (CMP and BNP). Office will call with lab results.  -Recommend to INCREASE Furosemide  (Lasix ). Take (1) 20mg  tablet twice a day for 3 days. Also, recommend to take Potassium Chloride  20 mEq tablet twice a day for 3 days. -If edema or shortness of breath becomes worse, or develops chest pain, follow up with the closes emergency department.  -Follow up 1 week with Dr. Ival Marines.   Return in about 1 week (around 12/06/2023) for follow up with Dr. Ival Marines.   Tylisha Danis, NP

## 2023-11-30 ENCOUNTER — Ambulatory Visit

## 2023-11-30 DIAGNOSIS — R748 Abnormal levels of other serum enzymes: Secondary | ICD-10-CM | POA: Diagnosis not present

## 2023-11-30 LAB — COMPLETE METABOLIC PANEL WITHOUT GFR
AG Ratio: 1.3 (calc) (ref 1.0–2.5)
ALT: 17 U/L (ref 6–29)
AST: 26 U/L (ref 10–35)
Albumin: 3.1 g/dL — ABNORMAL LOW (ref 3.6–5.1)
Alkaline phosphatase (APISO): 156 U/L — ABNORMAL HIGH (ref 37–153)
BUN/Creatinine Ratio: 8 (calc) (ref 6–22)
BUN: 9 mg/dL (ref 7–25)
CO2: 21 mmol/L (ref 20–32)
Calcium: 8.6 mg/dL (ref 8.6–10.4)
Chloride: 111 mmol/L — ABNORMAL HIGH (ref 98–110)
Creat: 1.07 mg/dL — ABNORMAL HIGH (ref 0.60–0.95)
Globulin: 2.4 g/dL (ref 1.9–3.7)
Glucose, Bld: 98 mg/dL (ref 65–99)
Potassium: 4.4 mmol/L (ref 3.5–5.3)
Sodium: 142 mmol/L (ref 135–146)
Total Bilirubin: 0.8 mg/dL (ref 0.2–1.2)
Total Protein: 5.5 g/dL — ABNORMAL LOW (ref 6.1–8.1)

## 2023-11-30 LAB — COMPREHENSIVE METABOLIC PANEL WITH GFR
ALT: 18 U/L (ref 0–35)
AST: 27 U/L (ref 0–37)
Albumin: 3.2 g/dL — ABNORMAL LOW (ref 3.5–5.2)
Alkaline Phosphatase: 151 U/L — ABNORMAL HIGH (ref 39–117)
BUN: 10 mg/dL (ref 6–23)
CO2: 25 meq/L (ref 19–32)
Calcium: 8.6 mg/dL (ref 8.4–10.5)
Chloride: 110 meq/L (ref 96–112)
Creatinine, Ser: 1.06 mg/dL (ref 0.40–1.20)
GFR: 48.06 mL/min — ABNORMAL LOW (ref >60.00)
Glucose, Bld: 94 mg/dL (ref 70–99)
Potassium: 4.3 meq/L (ref 3.5–5.1)
Sodium: 142 meq/L (ref 135–145)
Total Bilirubin: 0.8 mg/dL (ref 0.2–1.2)
Total Protein: 6.1 g/dL (ref 6.0–8.3)

## 2023-11-30 LAB — CBC WITH DIFFERENTIAL/PLATELET
Basophils Absolute: 0.1 10*3/uL (ref 0.0–0.1)
Basophils Relative: 1.5 % (ref 0.0–3.0)
Eosinophils Absolute: 0.1 10*3/uL (ref 0.0–0.7)
Eosinophils Relative: 2.5 % (ref 0.0–5.0)
HCT: 34 % — ABNORMAL LOW (ref 36.0–46.0)
Hemoglobin: 10.2 g/dL — ABNORMAL LOW (ref 12.0–15.0)
Lymphocytes Relative: 42.2 % (ref 12.0–46.0)
Lymphs Abs: 1.8 10*3/uL (ref 0.7–4.0)
MCHC: 30 g/dL (ref 30.0–36.0)
MCV: 72 fl — ABNORMAL LOW (ref 78.0–100.0)
Monocytes Absolute: 0.5 10*3/uL (ref 0.1–1.0)
Monocytes Relative: 11.9 % (ref 3.0–12.0)
Neutro Abs: 1.7 10*3/uL (ref 1.4–7.7)
Neutrophils Relative %: 41.9 % — ABNORMAL LOW (ref 43.0–77.0)
Platelets: 162 10*3/uL (ref 150.0–400.0)
RBC: 4.73 Mil/uL (ref 3.87–5.11)
RDW: 28.9 % — ABNORMAL HIGH (ref 11.5–15.5)
WBC: 4.2 10*3/uL (ref 4.0–10.5)

## 2023-11-30 LAB — BRAIN NATRIURETIC PEPTIDE: Pro B Natriuretic peptide (BNP): 445 pg/mL — ABNORMAL HIGH (ref 0.0–100.0)

## 2023-11-30 LAB — GAMMA GT: GGT: 27 U/L (ref 7–51)

## 2023-11-30 NOTE — Addendum Note (Signed)
 Addended by: Teodoro Feller B on: 11/30/2023 04:50 PM   Modules accepted: Orders

## 2023-12-04 ENCOUNTER — Telehealth: Payer: Self-pay

## 2023-12-04 DIAGNOSIS — I5033 Acute on chronic diastolic (congestive) heart failure: Secondary | ICD-10-CM | POA: Diagnosis not present

## 2023-12-04 DIAGNOSIS — I442 Atrioventricular block, complete: Secondary | ICD-10-CM | POA: Diagnosis not present

## 2023-12-04 DIAGNOSIS — I110 Hypertensive heart disease with heart failure: Secondary | ICD-10-CM | POA: Diagnosis not present

## 2023-12-04 DIAGNOSIS — D630 Anemia in neoplastic disease: Secondary | ICD-10-CM | POA: Diagnosis not present

## 2023-12-04 DIAGNOSIS — J439 Emphysema, unspecified: Secondary | ICD-10-CM | POA: Diagnosis not present

## 2023-12-04 DIAGNOSIS — I495 Sick sinus syndrome: Secondary | ICD-10-CM | POA: Diagnosis not present

## 2023-12-04 DIAGNOSIS — C189 Malignant neoplasm of colon, unspecified: Secondary | ICD-10-CM | POA: Diagnosis not present

## 2023-12-04 DIAGNOSIS — I480 Paroxysmal atrial fibrillation: Secondary | ICD-10-CM | POA: Diagnosis not present

## 2023-12-04 DIAGNOSIS — J4530 Mild persistent asthma, uncomplicated: Secondary | ICD-10-CM | POA: Diagnosis not present

## 2023-12-04 NOTE — Telephone Encounter (Signed)
 Copied from CRM [id number]. Topic: Clinical - Home Health Verbal Orders >> Dec 04, 2023  4:17 PM Magdalene School wrote: Caller/Agency: [facility], Doretha Ganja Number: [phone] Service Requested: Physical Therapy Frequency: Extend Once a week for 3 weeks Any new concerns about the patient? No

## 2023-12-05 NOTE — Telephone Encounter (Signed)
 Left detailed message on machine for [facility], Polly Brink white with verbal orders for PT.

## 2023-12-06 ENCOUNTER — Ambulatory Visit: Admitting: Internal Medicine

## 2023-12-06 ENCOUNTER — Encounter: Payer: Self-pay | Admitting: Internal Medicine

## 2023-12-06 VITALS — BP 110/68 | HR 70 | Temp 97.6°F | Wt 220.8 lb

## 2023-12-06 DIAGNOSIS — J4530 Mild persistent asthma, uncomplicated: Secondary | ICD-10-CM | POA: Diagnosis not present

## 2023-12-06 DIAGNOSIS — J439 Emphysema, unspecified: Secondary | ICD-10-CM | POA: Diagnosis not present

## 2023-12-06 DIAGNOSIS — I5033 Acute on chronic diastolic (congestive) heart failure: Secondary | ICD-10-CM

## 2023-12-06 DIAGNOSIS — D630 Anemia in neoplastic disease: Secondary | ICD-10-CM | POA: Diagnosis not present

## 2023-12-06 DIAGNOSIS — C189 Malignant neoplasm of colon, unspecified: Secondary | ICD-10-CM | POA: Diagnosis not present

## 2023-12-06 DIAGNOSIS — I480 Paroxysmal atrial fibrillation: Secondary | ICD-10-CM | POA: Diagnosis not present

## 2023-12-06 DIAGNOSIS — I495 Sick sinus syndrome: Secondary | ICD-10-CM | POA: Diagnosis not present

## 2023-12-06 DIAGNOSIS — I110 Hypertensive heart disease with heart failure: Secondary | ICD-10-CM | POA: Diagnosis not present

## 2023-12-06 DIAGNOSIS — I442 Atrioventricular block, complete: Secondary | ICD-10-CM | POA: Diagnosis not present

## 2023-12-06 LAB — BASIC METABOLIC PANEL WITH GFR
BUN: 14 mg/dL (ref 6–23)
CO2: 25 meq/L (ref 19–32)
Calcium: 8.4 mg/dL (ref 8.4–10.5)
Chloride: 111 meq/L (ref 96–112)
Creatinine, Ser: 0.9 mg/dL (ref 0.40–1.20)
GFR: 58.48 mL/min — ABNORMAL LOW (ref >60.00)
Glucose, Bld: 95 mg/dL (ref 70–99)
Potassium: 3.9 meq/L (ref 3.5–5.1)
Sodium: 143 meq/L (ref 135–145)

## 2023-12-06 MED ORDER — POTASSIUM CHLORIDE CRYS ER 20 MEQ PO TBCR: 20.0000 meq | EXTENDED_RELEASE_TABLET | Freq: Two times a day (BID) | ORAL | 0 refills | Status: DC

## 2023-12-06 NOTE — Progress Notes (Signed)
 Established Patient Office Visit     CC/Reason for Visit: 1 week follow-up  HPI: [name] is a 83 y.o. female who is coming in today for the above mentioned reasons. Past Medical History is significant for: Acute on chronic diastolic heart failure.  Last week we received a call from her home health nurse about increasing lower extremity edema and a 10 pound weight gain.  She was seen by another in office provider who increased her Lasix  from 20 daily to 40 daily in addition to giving her some extra potassium supplementation and she was scheduled to see me today.  Her most recent echocardiogram is from May 2024 that shows an ejection fraction of 55 to 60%, no wall motion abnormalities and grade 1 diastolic dysfunction.  Today weight records show she has dropped 5.2 pounds, states her lower extremity edema is a little improved although still present.  She is no longer short of breath other than which she is at baseline.   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   rxd 3 yrs meds, no surgery in kidneys    Past Surgical History:  Procedure Laterality Date   ABDOMINAL HYSTERECTOMY     BIOPSY  03/23/2023   Procedure: BIOPSY;  Surgeon: Lajuan Pila, MD;  Location: [facility];  Service: Gastroenterology;;   BREAST BIOPSY  04/30/2012   Procedure: BREAST BIOPSY WITH NEEDLE LOCALIZATION;  Surgeon: Enid Harry, MD;  Location: [facility] OR;  Service: General;  Laterality: Left;  Left breast wire localization biospy   COLONOSCOPY WITH PROPOFOL  N/A 03/23/2023    Procedure: COLONOSCOPY WITH PROPOFOL ;  Surgeon: Lajuan Pila, MD;  Location: [facility];  Service: Gastroenterology;  Laterality: N/A;   ESOPHAGOGASTRODUODENOSCOPY (EGD) WITH PROPOFOL  N/A 03/23/2023   Procedure: ESOPHAGOGASTRODUODENOSCOPY (EGD) WITH PROPOFOL ;  Surgeon: Lajuan Pila, MD;  Location: [facility];  Service: Gastroenterology;  Laterality: N/A;   HEMORRHOID SURGERY     LAPAROTOMY N/A 03/26/2023   Procedure: EXPLORATORY LAPAROTOMY, RIGHT HEMICOLECTOMY; LYSIS OF ADHESIONS;  Surgeon: Dareen Ebbing, MD;  Location: [facility];  Service: General;  Laterality: N/A;   PACEMAKER INSERTION  02-06-2011   PPM GENERATOR CHANGEOUT N/A 04/19/2020   Procedure: PPM GENERATOR CHANGEOUT;  Surgeon: Tammie Fall, MD;  Location: [facility];  Service: Cardiovascular;  Laterality: N/A;   SUBMUCOSAL TATTOO INJECTION  03/23/2023   Procedure: SUBMUCOSAL TATTOO INJECTION;  Surgeon: Lajuan Pila, MD;  Location: [facility];  Service: Gastroenterology;;   TAH and BSO     TONSILLECTOMY      Social History:  reports that she quit smoking about 46 years ago. Her smoking use included cigarettes. She started smoking about 50 years ago. She has a 4 pack-year smoking history. She has never used smokeless tobacco. She reports that she does not drink alcohol and does not use drugs.  Allergies: Allergies  Allergen Reactions   Albuterol  Palpitations and Other (See Comments)  Irregular heartbeat      Family History:  Family History  Problem Relation Age of Onset   Heart disease Mother    Breast cancer Mother    Breast cancer Sister    Prostate cancer Brother    Prostate cancer Son 2     Current Outpatient Medications:    acetaminophen  (TYLENOL ) 325 MG tablet, Take 2 tablets (650 mg total) by mouth every 6 (six) hours as needed for mild pain (or Fever >/= 101)., Disp: , Rfl:    albuterol  (VENTOLIN  HFA) 108 (90 Base) MCG/ACT inhaler, Inhale 2 puffs into the lungs every 4 (four) hours as needed.  (Patient taking differently: Inhale 2 puffs into the lungs every 4 (four) hours as needed for wheezing or shortness of breath.), Disp: 54 g, Rfl: 1   apixaban  (ELIQUIS ) 5 MG TABS tablet, TAKE 1 TABLET TWICE DAILY, Disp: 180 tablet, Rfl: 0   atorvastatin  (LIPITOR) 20 MG tablet, TAKE 1 TABLET EVERY DAY (Patient taking differently: Take 20 mg by mouth at bedtime.), Disp: 90 tablet, Rfl: 0   bisacodyl  (DULCOLAX) 5 MG EC tablet, Take 1 tablet (5 mg total) by mouth daily as needed for moderate constipation., Disp: , Rfl:    budesonide -formoterol  (SYMBICORT ) 80-4.5 MCG/ACT inhaler, INHALE 2 PUFFS INTO THE LUNGS 2 (TWO) TIMES DAILY. (Patient taking differently: Inhale 2 puffs into the lungs 2 (two) times daily.), Disp: 3 each, Rfl: 0   docusate sodium  (COLACE) 100 MG capsule, Take 1 capsule (100 mg total) by mouth 2 (two) times daily., Disp: , Rfl:    feeding supplement (ENSURE SURGERY) LIQD, Take 237 mLs by mouth 3 (three) times daily between meals., Disp: , Rfl:    ferrous gluconate  (FERGON) 324 MG tablet, Take 1 tablet (324 mg total) by mouth 2 (two) times daily with a meal., Disp: 180 tablet, Rfl: 0   furosemide  (LASIX ) 20 MG tablet, Take 1 tablet (20 mg total) by mouth daily as needed for fluid or edema., Disp: 90 tablet, Rfl: 0   montelukast  (SINGULAIR ) 10 MG tablet, TAKE 1 TABLET AT BEDTIME, Disp: 90 tablet, Rfl: 0   Nutritional Supplements (ENSURE ORIGINAL) LIQD, Take 237 mLs by mouth daily as needed (when the appetite is lacking)., Disp: , Rfl:    omeprazole  (PRILOSEC ) 20 MG capsule, TAKE 1 CAPSULE EVERY DAY 30 TO 60 MINUTES BEFORE FIRST MEAL OF THE DAY, Disp: 90 capsule, Rfl: 1   [START ON 12/07/2023] potassium chloride  SA (KLOR-CON  M) 20 MEQ tablet, Take 1 tablet (20 mEq total) by mouth 2 (two) times daily for 3 days., Disp: 6 tablet, Rfl: 0  Review of Systems:  Negative unless indicated in HPI.   Physical Exam: Vitals:   12/06/23 1128  BP: 110/68  Pulse: 70  Temp: 97.6 F (36.4 C)   TempSrc: Oral  SpO2: 99%  Weight: 220 lb 12.8 oz (100.2 kg)    Body mass index is 34.58 kg/m.   Physical Exam Vitals reviewed.  Constitutional:      Appearance: Normal appearance. She is obese.  HENT:     Head: Normocephalic and atraumatic.  Eyes:     Conjunctiva/sclera: Conjunctivae normal.  Cardiovascular:     Rate and Rhythm: Normal rate and regular rhythm.  Pulmonary:     Effort: Pulmonary effort is normal.     Breath sounds: Normal breath sounds.  Musculoskeletal:     Right lower leg: Edema present.     Left lower leg: Edema present.  Skin:    General: Skin is  warm and dry.  Neurological:     General: No focal deficit present.     Mental Status: She is alert and oriented to person, place, and time.  Psychiatric:        Mood and Affect: Mood normal.        Behavior: Behavior normal.        Thought Content: Thought content normal.        Judgment: Judgment normal.      Impression and Plan:  Acute on chronic diastolic CHF (congestive heart failure) (HCC) -     Basic metabolic panel with GFR; Future -     Potassium Chloride  Crys ER; Take 1 tablet (20 mEq total) by mouth 2 (two) times daily for 3 days.  Dispense: 6 tablet; Refill: 0   - She is improving, however remains volume overloaded.  Will again increase her Lasix  to 40 mg daily for 3 days.  Check basic metabolic panel today to follow renal function and electrolytes.  I will have her schedule a 2-week follow-up.  Time spent:30 minutes reviewing chart, interviewing and examining patient and formulating plan of care.     Marguerita Shih, MD  Primary Care at [facility]

## 2023-12-14 ENCOUNTER — Other Ambulatory Visit: Payer: Self-pay | Admitting: Internal Medicine

## 2023-12-14 DIAGNOSIS — I442 Atrioventricular block, complete: Secondary | ICD-10-CM | POA: Diagnosis not present

## 2023-12-14 DIAGNOSIS — C189 Malignant neoplasm of colon, unspecified: Secondary | ICD-10-CM | POA: Diagnosis not present

## 2023-12-14 DIAGNOSIS — I5032 Chronic diastolic (congestive) heart failure: Secondary | ICD-10-CM

## 2023-12-14 DIAGNOSIS — J4530 Mild persistent asthma, uncomplicated: Secondary | ICD-10-CM | POA: Diagnosis not present

## 2023-12-14 DIAGNOSIS — I480 Paroxysmal atrial fibrillation: Secondary | ICD-10-CM | POA: Diagnosis not present

## 2023-12-14 DIAGNOSIS — D630 Anemia in neoplastic disease: Secondary | ICD-10-CM | POA: Diagnosis not present

## 2023-12-14 DIAGNOSIS — I5033 Acute on chronic diastolic (congestive) heart failure: Secondary | ICD-10-CM | POA: Diagnosis not present

## 2023-12-14 DIAGNOSIS — I110 Hypertensive heart disease with heart failure: Secondary | ICD-10-CM | POA: Diagnosis not present

## 2023-12-14 DIAGNOSIS — I2699 Other pulmonary embolism without acute cor pulmonale: Secondary | ICD-10-CM

## 2023-12-14 DIAGNOSIS — I495 Sick sinus syndrome: Secondary | ICD-10-CM | POA: Diagnosis not present

## 2023-12-14 DIAGNOSIS — J439 Emphysema, unspecified: Secondary | ICD-10-CM | POA: Diagnosis not present

## 2023-12-18 DIAGNOSIS — C189 Malignant neoplasm of colon, unspecified: Secondary | ICD-10-CM | POA: Diagnosis not present

## 2023-12-18 DIAGNOSIS — I442 Atrioventricular block, complete: Secondary | ICD-10-CM | POA: Diagnosis not present

## 2023-12-18 DIAGNOSIS — I495 Sick sinus syndrome: Secondary | ICD-10-CM | POA: Diagnosis not present

## 2023-12-18 DIAGNOSIS — I480 Paroxysmal atrial fibrillation: Secondary | ICD-10-CM | POA: Diagnosis not present

## 2023-12-18 DIAGNOSIS — J4530 Mild persistent asthma, uncomplicated: Secondary | ICD-10-CM | POA: Diagnosis not present

## 2023-12-18 DIAGNOSIS — I5033 Acute on chronic diastolic (congestive) heart failure: Secondary | ICD-10-CM | POA: Diagnosis not present

## 2023-12-18 DIAGNOSIS — I110 Hypertensive heart disease with heart failure: Secondary | ICD-10-CM | POA: Diagnosis not present

## 2023-12-18 DIAGNOSIS — D630 Anemia in neoplastic disease: Secondary | ICD-10-CM | POA: Diagnosis not present

## 2023-12-18 DIAGNOSIS — J439 Emphysema, unspecified: Secondary | ICD-10-CM | POA: Diagnosis not present

## 2023-12-19 DIAGNOSIS — I110 Hypertensive heart disease with heart failure: Secondary | ICD-10-CM | POA: Diagnosis not present

## 2023-12-19 DIAGNOSIS — J4530 Mild persistent asthma, uncomplicated: Secondary | ICD-10-CM | POA: Diagnosis not present

## 2023-12-19 DIAGNOSIS — I495 Sick sinus syndrome: Secondary | ICD-10-CM | POA: Diagnosis not present

## 2023-12-19 DIAGNOSIS — J439 Emphysema, unspecified: Secondary | ICD-10-CM | POA: Diagnosis not present

## 2023-12-19 DIAGNOSIS — I442 Atrioventricular block, complete: Secondary | ICD-10-CM | POA: Diagnosis not present

## 2023-12-19 DIAGNOSIS — D630 Anemia in neoplastic disease: Secondary | ICD-10-CM | POA: Diagnosis not present

## 2023-12-19 DIAGNOSIS — C189 Malignant neoplasm of colon, unspecified: Secondary | ICD-10-CM | POA: Diagnosis not present

## 2023-12-19 DIAGNOSIS — I480 Paroxysmal atrial fibrillation: Secondary | ICD-10-CM | POA: Diagnosis not present

## 2023-12-19 DIAGNOSIS — I5033 Acute on chronic diastolic (congestive) heart failure: Secondary | ICD-10-CM | POA: Diagnosis not present

## 2023-12-24 ENCOUNTER — Ambulatory Visit: Admitting: Internal Medicine

## 2023-12-24 ENCOUNTER — Encounter: Payer: Self-pay | Admitting: Internal Medicine

## 2023-12-24 VITALS — BP 130/62 | HR 69 | Temp 97.7°F | Ht 67.0 in | Wt 221.8 lb

## 2023-12-24 DIAGNOSIS — I5033 Acute on chronic diastolic (congestive) heart failure: Secondary | ICD-10-CM | POA: Diagnosis not present

## 2023-12-24 DIAGNOSIS — I5032 Chronic diastolic (congestive) heart failure: Secondary | ICD-10-CM | POA: Diagnosis not present

## 2023-12-24 DIAGNOSIS — C189 Malignant neoplasm of colon, unspecified: Secondary | ICD-10-CM | POA: Diagnosis not present

## 2023-12-24 DIAGNOSIS — J439 Emphysema, unspecified: Secondary | ICD-10-CM | POA: Diagnosis not present

## 2023-12-24 DIAGNOSIS — I442 Atrioventricular block, complete: Secondary | ICD-10-CM | POA: Diagnosis not present

## 2023-12-24 DIAGNOSIS — I495 Sick sinus syndrome: Secondary | ICD-10-CM | POA: Diagnosis not present

## 2023-12-24 DIAGNOSIS — D630 Anemia in neoplastic disease: Secondary | ICD-10-CM | POA: Diagnosis not present

## 2023-12-24 DIAGNOSIS — I110 Hypertensive heart disease with heart failure: Secondary | ICD-10-CM | POA: Diagnosis not present

## 2023-12-24 DIAGNOSIS — I480 Paroxysmal atrial fibrillation: Secondary | ICD-10-CM | POA: Diagnosis not present

## 2023-12-24 DIAGNOSIS — J4530 Mild persistent asthma, uncomplicated: Secondary | ICD-10-CM | POA: Diagnosis not present

## 2023-12-24 DIAGNOSIS — I10 Essential (primary) hypertension: Secondary | ICD-10-CM | POA: Diagnosis not present

## 2023-12-24 MED ORDER — FUROSEMIDE 20 MG PO TABS: 40.0000 mg | ORAL_TABLET | Freq: Every day | ORAL | 1 refills | Status: DC | PRN

## 2023-12-24 NOTE — Progress Notes (Signed)
 Established Patient Office Visit     CC/Reason for Visit: 2-week follow-up heart failure  HPI: [name] is a 83 y.o. female who is coming in today for the above mentioned reasons. Past Medical History is significant for: Acute on chronic diastolic heart failure.  She was seen in office 2 weeks ago.  She still had lower extremity edema and was asked to increase her daily Lasix  from 20 to 40 mg for 3 days and return for follow-up.  At that time renal function was actually improved with a creatinine down to 0.9.  Denies dyspnea on exertion.  Continues to have significant lower extremity edema.   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   rxd 3 yrs meds, no surgery in kidneys    Past Surgical History:  Procedure Laterality Date   ABDOMINAL HYSTERECTOMY     BIOPSY  03/23/2023   Procedure: BIOPSY;  Surgeon: Lajuan Pila, MD;  Location: [facility];  Service: Gastroenterology;;   BREAST BIOPSY  04/30/2012   Procedure: BREAST BIOPSY WITH NEEDLE LOCALIZATION;  Surgeon: Enid Harry, MD;  Location: [facility] OR;  Service: General;  Laterality: Left;  Left breast wire localization biospy   COLONOSCOPY WITH PROPOFOL  N/A 03/23/2023   Procedure: COLONOSCOPY WITH PROPOFOL ;  Surgeon: Lajuan Pila, MD;  Location: [facility];  Service: Gastroenterology;  Laterality: N/A;   ESOPHAGOGASTRODUODENOSCOPY (EGD) WITH PROPOFOL  N/A 03/23/2023   Procedure: ESOPHAGOGASTRODUODENOSCOPY (EGD) WITH PROPOFOL ;  Surgeon: Lajuan Pila, MD;  Location: [facility];  Service:  Gastroenterology;  Laterality: N/A;   HEMORRHOID SURGERY     LAPAROTOMY N/A 03/26/2023   Procedure: EXPLORATORY LAPAROTOMY, RIGHT HEMICOLECTOMY; LYSIS OF ADHESIONS;  Surgeon: Dareen Ebbing, MD;  Location: [facility];  Service: General;  Laterality: N/A;   PACEMAKER INSERTION  02-06-2011   PPM GENERATOR CHANGEOUT N/A 04/19/2020   Procedure: PPM GENERATOR CHANGEOUT;  Surgeon: Tammie Fall, MD;  Location: [facility];  Service: Cardiovascular;  Laterality: N/A;   SUBMUCOSAL TATTOO INJECTION  03/23/2023   Procedure: SUBMUCOSAL TATTOO INJECTION;  Surgeon: Lajuan Pila, MD;  Location: [facility];  Service: Gastroenterology;;   TAH and BSO     TONSILLECTOMY      Social History:  reports that she quit smoking about 46 years ago. Her smoking use included cigarettes. She started smoking about 50 years ago. She has a 4 pack-year smoking history. She has never used smokeless tobacco. She reports that she does not drink alcohol and does not use drugs.  Allergies: Allergies  Allergen Reactions   Albuterol  Palpitations and Other (See Comments)    Irregular heartbeat      Family History:  Family History  Problem Relation Age of Onset   Heart disease Mother    Breast cancer Mother    Breast cancer Sister    Prostate cancer Brother    Prostate cancer Son 12     Current Outpatient Medications:  acetaminophen  (TYLENOL ) 325 MG tablet, Take 2 tablets (650 mg total) by mouth every 6 (six) hours as needed for mild pain (or Fever >/= 101)., Disp: , Rfl:    albuterol  (VENTOLIN  HFA) 108 (90 Base) MCG/ACT inhaler, Inhale 2 puffs into the lungs every 4 (four) hours as needed. (Patient taking differently: Inhale 2 puffs into the lungs every 4 (four) hours as needed for wheezing or shortness of breath.), Disp: 54 g, Rfl: 1   apixaban  (ELIQUIS ) 5 MG TABS tablet, TAKE 1 TABLET TWICE DAILY (NEED MD APPOINTMENT), Disp: 180 tablet, Rfl: 1   atorvastatin  (LIPITOR) 20 MG tablet, TAKE 1 TABLET EVERY DAY,  Disp: 90 tablet, Rfl: 1   bisacodyl  (DULCOLAX) 5 MG EC tablet, Take 1 tablet (5 mg total) by mouth daily as needed for moderate constipation., Disp: , Rfl:    budesonide -formoterol  (SYMBICORT ) 80-4.5 MCG/ACT inhaler, INHALE 2 PUFFS INTO THE LUNGS 2 (TWO) TIMES DAILY. (Patient taking differently: Inhale 2 puffs into the lungs 2 (two) times daily.), Disp: 3 each, Rfl: 0   docusate sodium  (COLACE) 100 MG capsule, Take 1 capsule (100 mg total) by mouth 2 (two) times daily., Disp: , Rfl:    feeding supplement (ENSURE SURGERY) LIQD, Take 237 mLs by mouth 3 (three) times daily between meals., Disp: , Rfl:    ferrous gluconate  (FERGON) 324 MG tablet, Take 1 tablet (324 mg total) by mouth 2 (two) times daily with a meal., Disp: 180 tablet, Rfl: 0   montelukast  (SINGULAIR ) 10 MG tablet, TAKE 1 TABLET AT BEDTIME, Disp: 90 tablet, Rfl: 1   Nutritional Supplements (ENSURE ORIGINAL) LIQD, Take 237 mLs by mouth daily as needed (when the appetite is lacking)., Disp: , Rfl:    omeprazole  (PRILOSEC ) 20 MG capsule, TAKE 1 CAPSULE EVERY DAY 30 TO 60 MINUTES BEFORE FIRST MEAL OF THE DAY, Disp: 90 capsule, Rfl: 1   furosemide  (LASIX ) 20 MG tablet, Take 2 tablets (40 mg total) by mouth daily as needed for fluid or edema., Disp: 180 tablet, Rfl: 1  Review of Systems:  Negative unless indicated in HPI.   Physical Exam: Vitals:   12/24/23 1125 12/24/23 1129  BP: (!) 140/66 130/62  Pulse: 69   Temp: 97.7 F (36.5 C)   TempSrc: Oral   SpO2: 97%   Weight: 221 lb 12.8 oz (100.6 kg)   Height: 5\' 7"  (1.702 m)     Body mass index is 34.74 kg/m.   Physical Exam Vitals reviewed.  Constitutional:      Appearance: Normal appearance. She is obese.  HENT:     Head: Normocephalic and atraumatic.  Eyes:     Conjunctiva/sclera: Conjunctivae normal.  Cardiovascular:     Rate and Rhythm: Normal rate and regular rhythm.  Pulmonary:     Effort: Pulmonary effort is normal.     Breath sounds: Normal breath sounds.   Musculoskeletal:     Right lower leg: Edema present.     Left lower leg: Edema present.  Skin:    General: Skin is warm and dry.  Neurological:     General: No focal deficit present.     Mental Status: She is alert and oriented to person, place, and time.  Psychiatric:        Mood and Affect: Mood normal.        Behavior: Behavior normal.        Thought Content: Thought content normal.        Judgment: Judgment normal.      Impression  and Plan:  Acute on chronic diastolic CHF (congestive heart failure) (HCC)  Essential hypertension  Chronic diastolic heart failure (HCC) -     Furosemide ; Take 2 tablets (40 mg total) by mouth daily as needed for fluid or edema.  Dispense: 180 tablet; Refill: 1   - Continues with 3+ lower extremity edema.  Denies shortness of breath, dyspnea on exertion or chest pain. - Increase Lasix  from 20 to 40 mg daily.  She will return in 6 weeks for follow-up. - Advised to get fitted for and wear compression stockings daily.  Time spent:31 minutes reviewing chart, interviewing and examining patient and formulating plan of care.     Marguerita Shih, MD [place] at [facility]

## 2023-12-25 ENCOUNTER — Telehealth: Payer: Self-pay | Admitting: *Deleted

## 2023-12-25 DIAGNOSIS — C189 Malignant neoplasm of colon, unspecified: Secondary | ICD-10-CM | POA: Diagnosis not present

## 2023-12-25 DIAGNOSIS — I495 Sick sinus syndrome: Secondary | ICD-10-CM | POA: Diagnosis not present

## 2023-12-25 DIAGNOSIS — I5033 Acute on chronic diastolic (congestive) heart failure: Secondary | ICD-10-CM | POA: Diagnosis not present

## 2023-12-25 DIAGNOSIS — J4530 Mild persistent asthma, uncomplicated: Secondary | ICD-10-CM | POA: Diagnosis not present

## 2023-12-25 DIAGNOSIS — D630 Anemia in neoplastic disease: Secondary | ICD-10-CM | POA: Diagnosis not present

## 2023-12-25 DIAGNOSIS — I442 Atrioventricular block, complete: Secondary | ICD-10-CM | POA: Diagnosis not present

## 2023-12-25 DIAGNOSIS — I110 Hypertensive heart disease with heart failure: Secondary | ICD-10-CM | POA: Diagnosis not present

## 2023-12-25 DIAGNOSIS — J439 Emphysema, unspecified: Secondary | ICD-10-CM | POA: Diagnosis not present

## 2023-12-25 DIAGNOSIS — I480 Paroxysmal atrial fibrillation: Secondary | ICD-10-CM | POA: Diagnosis not present

## 2023-12-25 NOTE — Telephone Encounter (Signed)
 Verbal orders given to [facility] for PT.

## 2023-12-25 NOTE — Telephone Encounter (Signed)
 Copied from CRM [id number]. Topic: Clinical - Home Health Verbal Orders >> Dec 25, 2023 11:03 AM Dimple Francis wrote: Caller/Agency: [facility] Callback Number: [phone] Service Requested: Physical Therapy Frequency: 1 time a week for 4 weeks Any new concerns about the patient? No

## 2023-12-26 DIAGNOSIS — I110 Hypertensive heart disease with heart failure: Secondary | ICD-10-CM | POA: Diagnosis not present

## 2023-12-26 DIAGNOSIS — J439 Emphysema, unspecified: Secondary | ICD-10-CM | POA: Diagnosis not present

## 2023-12-26 DIAGNOSIS — J4530 Mild persistent asthma, uncomplicated: Secondary | ICD-10-CM | POA: Diagnosis not present

## 2023-12-26 DIAGNOSIS — I5033 Acute on chronic diastolic (congestive) heart failure: Secondary | ICD-10-CM | POA: Diagnosis not present

## 2023-12-26 DIAGNOSIS — D630 Anemia in neoplastic disease: Secondary | ICD-10-CM | POA: Diagnosis not present

## 2023-12-26 DIAGNOSIS — I442 Atrioventricular block, complete: Secondary | ICD-10-CM | POA: Diagnosis not present

## 2023-12-26 DIAGNOSIS — I495 Sick sinus syndrome: Secondary | ICD-10-CM | POA: Diagnosis not present

## 2023-12-26 DIAGNOSIS — I480 Paroxysmal atrial fibrillation: Secondary | ICD-10-CM | POA: Diagnosis not present

## 2023-12-26 DIAGNOSIS — C189 Malignant neoplasm of colon, unspecified: Secondary | ICD-10-CM | POA: Diagnosis not present

## 2023-12-29 DIAGNOSIS — J439 Emphysema, unspecified: Secondary | ICD-10-CM | POA: Diagnosis not present

## 2023-12-29 DIAGNOSIS — I110 Hypertensive heart disease with heart failure: Secondary | ICD-10-CM | POA: Diagnosis not present

## 2023-12-29 DIAGNOSIS — C189 Malignant neoplasm of colon, unspecified: Secondary | ICD-10-CM | POA: Diagnosis not present

## 2023-12-29 DIAGNOSIS — D630 Anemia in neoplastic disease: Secondary | ICD-10-CM | POA: Diagnosis not present

## 2023-12-29 DIAGNOSIS — J4530 Mild persistent asthma, uncomplicated: Secondary | ICD-10-CM | POA: Diagnosis not present

## 2023-12-29 DIAGNOSIS — I495 Sick sinus syndrome: Secondary | ICD-10-CM | POA: Diagnosis not present

## 2023-12-29 DIAGNOSIS — I480 Paroxysmal atrial fibrillation: Secondary | ICD-10-CM | POA: Diagnosis not present

## 2023-12-29 DIAGNOSIS — I442 Atrioventricular block, complete: Secondary | ICD-10-CM | POA: Diagnosis not present

## 2023-12-29 DIAGNOSIS — I5033 Acute on chronic diastolic (congestive) heart failure: Secondary | ICD-10-CM | POA: Diagnosis not present

## 2024-01-03 DIAGNOSIS — I495 Sick sinus syndrome: Secondary | ICD-10-CM | POA: Diagnosis not present

## 2024-01-03 DIAGNOSIS — C189 Malignant neoplasm of colon, unspecified: Secondary | ICD-10-CM | POA: Diagnosis not present

## 2024-01-03 DIAGNOSIS — I480 Paroxysmal atrial fibrillation: Secondary | ICD-10-CM | POA: Diagnosis not present

## 2024-01-03 DIAGNOSIS — I5033 Acute on chronic diastolic (congestive) heart failure: Secondary | ICD-10-CM | POA: Diagnosis not present

## 2024-01-03 DIAGNOSIS — I442 Atrioventricular block, complete: Secondary | ICD-10-CM | POA: Diagnosis not present

## 2024-01-03 DIAGNOSIS — I110 Hypertensive heart disease with heart failure: Secondary | ICD-10-CM | POA: Diagnosis not present

## 2024-01-03 DIAGNOSIS — J4530 Mild persistent asthma, uncomplicated: Secondary | ICD-10-CM | POA: Diagnosis not present

## 2024-01-03 DIAGNOSIS — D630 Anemia in neoplastic disease: Secondary | ICD-10-CM | POA: Diagnosis not present

## 2024-01-03 DIAGNOSIS — J439 Emphysema, unspecified: Secondary | ICD-10-CM | POA: Diagnosis not present

## 2024-01-09 DIAGNOSIS — J4530 Mild persistent asthma, uncomplicated: Secondary | ICD-10-CM | POA: Diagnosis not present

## 2024-01-09 DIAGNOSIS — I442 Atrioventricular block, complete: Secondary | ICD-10-CM | POA: Diagnosis not present

## 2024-01-09 DIAGNOSIS — I110 Hypertensive heart disease with heart failure: Secondary | ICD-10-CM | POA: Diagnosis not present

## 2024-01-09 DIAGNOSIS — J439 Emphysema, unspecified: Secondary | ICD-10-CM | POA: Diagnosis not present

## 2024-01-09 DIAGNOSIS — I5033 Acute on chronic diastolic (congestive) heart failure: Secondary | ICD-10-CM | POA: Diagnosis not present

## 2024-01-09 DIAGNOSIS — C189 Malignant neoplasm of colon, unspecified: Secondary | ICD-10-CM | POA: Diagnosis not present

## 2024-01-09 DIAGNOSIS — I495 Sick sinus syndrome: Secondary | ICD-10-CM | POA: Diagnosis not present

## 2024-01-09 DIAGNOSIS — I480 Paroxysmal atrial fibrillation: Secondary | ICD-10-CM | POA: Diagnosis not present

## 2024-01-09 DIAGNOSIS — D630 Anemia in neoplastic disease: Secondary | ICD-10-CM | POA: Diagnosis not present

## 2024-01-10 DIAGNOSIS — I442 Atrioventricular block, complete: Secondary | ICD-10-CM | POA: Diagnosis not present

## 2024-01-10 DIAGNOSIS — I480 Paroxysmal atrial fibrillation: Secondary | ICD-10-CM | POA: Diagnosis not present

## 2024-01-10 DIAGNOSIS — I495 Sick sinus syndrome: Secondary | ICD-10-CM | POA: Diagnosis not present

## 2024-01-10 DIAGNOSIS — D630 Anemia in neoplastic disease: Secondary | ICD-10-CM | POA: Diagnosis not present

## 2024-01-10 DIAGNOSIS — I110 Hypertensive heart disease with heart failure: Secondary | ICD-10-CM | POA: Diagnosis not present

## 2024-01-10 DIAGNOSIS — J439 Emphysema, unspecified: Secondary | ICD-10-CM | POA: Diagnosis not present

## 2024-01-10 DIAGNOSIS — I5033 Acute on chronic diastolic (congestive) heart failure: Secondary | ICD-10-CM | POA: Diagnosis not present

## 2024-01-10 DIAGNOSIS — J4530 Mild persistent asthma, uncomplicated: Secondary | ICD-10-CM | POA: Diagnosis not present

## 2024-01-10 DIAGNOSIS — C189 Malignant neoplasm of colon, unspecified: Secondary | ICD-10-CM | POA: Diagnosis not present

## 2024-01-11 DIAGNOSIS — J4530 Mild persistent asthma, uncomplicated: Secondary | ICD-10-CM | POA: Diagnosis not present

## 2024-01-11 DIAGNOSIS — I480 Paroxysmal atrial fibrillation: Secondary | ICD-10-CM | POA: Diagnosis not present

## 2024-01-11 DIAGNOSIS — C189 Malignant neoplasm of colon, unspecified: Secondary | ICD-10-CM | POA: Diagnosis not present

## 2024-01-11 DIAGNOSIS — J439 Emphysema, unspecified: Secondary | ICD-10-CM | POA: Diagnosis not present

## 2024-01-11 DIAGNOSIS — I110 Hypertensive heart disease with heart failure: Secondary | ICD-10-CM | POA: Diagnosis not present

## 2024-01-11 DIAGNOSIS — I5033 Acute on chronic diastolic (congestive) heart failure: Secondary | ICD-10-CM | POA: Diagnosis not present

## 2024-01-11 DIAGNOSIS — D630 Anemia in neoplastic disease: Secondary | ICD-10-CM | POA: Diagnosis not present

## 2024-01-11 DIAGNOSIS — I442 Atrioventricular block, complete: Secondary | ICD-10-CM | POA: Diagnosis not present

## 2024-01-11 DIAGNOSIS — I495 Sick sinus syndrome: Secondary | ICD-10-CM | POA: Diagnosis not present

## 2024-01-16 ENCOUNTER — Telehealth: Payer: Self-pay

## 2024-01-16 NOTE — Telephone Encounter (Signed)
 Copied from CRM [id number]. Topic: Clinical - Medical Advice >> Jan 16, 2024  9:44 AM Keitha Pata L wrote: Reason for CRM: patient son called in and stated that he brought in some papers for a nurse to come out for his mother back in April and  wanted to know the status and a call back with a update at [phone]

## 2024-01-16 NOTE — Telephone Encounter (Signed)
 Spoke to the son and told him the following:    12/25/23  2:38 PM Note Verbal orders given to [facility] for PT.      Paper work was also faxed.

## 2024-01-18 DIAGNOSIS — J439 Emphysema, unspecified: Secondary | ICD-10-CM | POA: Diagnosis not present

## 2024-01-18 DIAGNOSIS — I442 Atrioventricular block, complete: Secondary | ICD-10-CM | POA: Diagnosis not present

## 2024-01-18 DIAGNOSIS — I110 Hypertensive heart disease with heart failure: Secondary | ICD-10-CM | POA: Diagnosis not present

## 2024-01-18 DIAGNOSIS — C189 Malignant neoplasm of colon, unspecified: Secondary | ICD-10-CM | POA: Diagnosis not present

## 2024-01-18 DIAGNOSIS — J4530 Mild persistent asthma, uncomplicated: Secondary | ICD-10-CM | POA: Diagnosis not present

## 2024-01-18 DIAGNOSIS — I495 Sick sinus syndrome: Secondary | ICD-10-CM | POA: Diagnosis not present

## 2024-01-18 DIAGNOSIS — I5033 Acute on chronic diastolic (congestive) heart failure: Secondary | ICD-10-CM | POA: Diagnosis not present

## 2024-01-18 DIAGNOSIS — I480 Paroxysmal atrial fibrillation: Secondary | ICD-10-CM | POA: Diagnosis not present

## 2024-01-18 DIAGNOSIS — D630 Anemia in neoplastic disease: Secondary | ICD-10-CM | POA: Diagnosis not present

## 2024-01-23 ENCOUNTER — Telehealth: Payer: Self-pay | Admitting: *Deleted

## 2024-01-23 NOTE — Transitions of Care (Post Inpatient/ED Visit) (Signed)
   01/23/2024  Name: [name] MRN: [id number] DOB: [birth year]  Today's TOC FU Call Status: Today's TOC FU Call Status:: Unsuccessful Call (1st Attempt) Unsuccessful Call (1st Attempt) Date: 01/23/24  Attempted to reach the patient regarding the most recent Inpatient/ED visit.  Follow Up Plan: Additional outreach attempts will be made to reach the patient to complete the Transitions of Care (Post Inpatient/ED visit) call.   Signature: Nicolina Barrios CMA

## 2024-02-07 ENCOUNTER — Encounter

## 2024-02-19 ENCOUNTER — Ambulatory Visit: Admitting: Internal Medicine

## 2024-04-08 ENCOUNTER — Ambulatory Visit: Payer: Self-pay | Admitting: *Deleted

## 2024-04-08 NOTE — Telephone Encounter (Signed)
  FYI Only or Action Required?: FYI only for provider.  Patient was last seen in primary care on 12/24/2023 by Theophilus Andrews, [name], MD.  Called Nurse Triage reporting Leg Swelling.  Symptoms began a week ago.  Interventions attempted: Rest, hydration, or home remedies.  Symptoms are: gradually worsening.  Triage Disposition: See Physician Within 24 Hours  Patient/caregiver understands and will follow disposition?: Yes                Copied from CRM #[id number]. Topic: Clinical - Red Word Triage >> Apr 08, 2024 11:44 AM Viola FALCON wrote: Red Word that prompted transfer to Nurse Triage: Patient right leg swollen since last night - son Meribeth on the phone Reason for Disposition  [1] MODERATE leg swelling (e.g., swelling extends up to knees) AND [2] new-onset or getting worse  Answer Assessment - Initial Assessment Questions Appt tomorrow. None available today . Denies chest pain no difficulty breathing no pain in calf denies redness. Did report warm/hot to touch. Recommended if pain worsens or sx change go to ED.      1. ONSET: When did the swelling start? (e.g., minutes, hours, days)     1-2 weeks per patient on 3 way call with son and patient  2. LOCATION: What part of the leg is swollen?  Are both legs swollen or just one leg?     Right leg to knee 3. SEVERITY: How bad is the swelling? (e.g., localized; mild, moderate, severe)     Slightly bigger than left leg 4. REDNESS: Is there redness or signs of infection?     Hot to touch  5. PAIN: Is the swelling painful to touch? If Yes, ask: How painful is it?   (Scale 1-10; mild, moderate or severe)     Dull pain , can walk with pain , but becoming worse 6. FEVER: Do you have a fever? If Yes, ask: What is it, how was it measured, and when did it start?      Na  7. CAUSE: What do you think is causing the leg swelling?     Not sure  8. MEDICAL HISTORY: Do you have a history of blood clots (e.g.,  DVT), cancer, heart failure, kidney disease, or liver failure?     See hx  9. RECURRENT SYMPTOM: Have you had leg swelling before? If Yes, ask: When was the last time? What happened that time?     Yes  10. OTHER SYMPTOMS: Do you have any other symptoms? (e.g., chest pain, difficulty breathing)       Right leg swelling from ankle to knee warm to touch no pain in calf 11. PREGNANCY: Is there any chance you are pregnant? When was your last menstrual period?       na  Protocols used: Leg Swelling and Edema-A-AH

## 2024-04-09 ENCOUNTER — Other Ambulatory Visit: Payer: Self-pay | Admitting: Internal Medicine

## 2024-04-09 ENCOUNTER — Encounter: Payer: Self-pay | Admitting: Internal Medicine

## 2024-04-09 ENCOUNTER — Ambulatory Visit (INDEPENDENT_AMBULATORY_CARE_PROVIDER_SITE_OTHER): Admitting: Internal Medicine

## 2024-04-09 VITALS — BP 124/84 | HR 70 | Temp 97.5°F | Wt 231.2 lb

## 2024-04-09 DIAGNOSIS — J4530 Mild persistent asthma, uncomplicated: Secondary | ICD-10-CM

## 2024-04-09 DIAGNOSIS — I5033 Acute on chronic diastolic (congestive) heart failure: Secondary | ICD-10-CM

## 2024-04-09 NOTE — Progress Notes (Signed)
 Established Patient Office Visit     CC/Reason for Visit: Lower extremity swelling  HPI: [name] is a 83 y.o. female who is coming in today for the above mentioned reasons. Past Medical History is significant for: Heart failure with preserved ejection fraction.  Since last seen in May she has gained 12 pounds and is brought in today by a family member due to significant lower extremity edema.  She denies shortness of breath or dyspnea on exertion.  She admits to rarely taking her Lasix  as it is very inconvenient for her due to increased urination.   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 OR;  Service: General;  Laterality: Left;  Left breast wire localization biospy   COLONOSCOPY WITH PROPOFOL  N/A 03/23/2023   Procedure: COLONOSCOPY WITH PROPOFOL ;  Surgeon: Charlanne Groom, MD;  Location: [facility];  Service: Gastroenterology;  Laterality: N/A;   ESOPHAGOGASTRODUODENOSCOPY (EGD) WITH PROPOFOL  N/A 03/23/2023   Procedure: ESOPHAGOGASTRODUODENOSCOPY (EGD) WITH PROPOFOL ;  Surgeon: Charlanne Groom, MD;  Location: [facility];  Service: Gastroenterology;  Laterality: N/A;    HEMORRHOID SURGERY     LAPAROTOMY N/A 03/26/2023   Procedure: EXPLORATORY LAPAROTOMY, RIGHT HEMICOLECTOMY; LYSIS OF ADHESIONS;  Surgeon: Belinda Donnice, MD;  Location: [facility];  Service: General;  Laterality: N/A;   PACEMAKER INSERTION  02-06-2011   PPM GENERATOR CHANGEOUT N/A 04/19/2020   Procedure: PPM GENERATOR CHANGEOUT;  Surgeon: Waddell Danelle ORN, MD;  [facility];  Service: Cardiovascular;  Laterality: N/A;   SUBMUCOSAL TATTOO INJECTION  03/23/2023   Procedure: SUBMUCOSAL TATTOO INJECTION;  Surgeon: Charlanne Groom, MD;  [facility]: [facility];  Service: Gastroenterology;;   TAH and BSO     TONSILLECTOMY      Social History:  reports that she quit smoking about 46 years ago. Her smoking use included cigarettes. She started smoking about 50 years ago. She has a 4 pack-year smoking history. She has never used smokeless tobacco. She reports that she does not drink alcohol and does not use drugs.  Allergies: Allergies  Allergen Reactions   Albuterol  Palpitations and Other (See Comments)    Irregular heartbeat      Family History:  Family History  Problem Relation Age of Onset   Heart disease Mother    Breast cancer Mother    Breast cancer Sister    Prostate cancer Brother    Prostate cancer Son 21     Current Outpatient Medications:    acetaminophen  (TYLENOL ) 325 MG tablet, Take 2  tablets (650 mg total) by mouth every 6 (six) hours as needed for mild pain (or Fever >/= 101)., Disp: , Rfl:    albuterol  (VENTOLIN  HFA) 108 (90 Base) MCG/ACT inhaler, Inhale 2 puffs into the lungs every 4 (four) hours as needed. (Patient taking differently: Inhale 2 puffs into the lungs every 4 (four) hours as needed for wheezing or shortness of breath.), Disp: 54 g, Rfl: 1   apixaban  (ELIQUIS ) 5 MG TABS tablet, TAKE 1 TABLET TWICE DAILY (NEED MD APPOINTMENT), Disp: 180 tablet, Rfl: 1   atorvastatin  (LIPITOR) 20 MG tablet, TAKE 1 TABLET EVERY DAY, Disp: 90 tablet, Rfl: 1   bisacodyl   (DULCOLAX) 5 MG EC tablet, Take 1 tablet (5 mg total) by mouth daily as needed for moderate constipation., Disp: , Rfl:    budesonide -formoterol  (SYMBICORT ) 80-4.5 MCG/ACT inhaler, INHALE 2 PUFFS INTO THE LUNGS 2 (TWO) TIMES DAILY. (Patient taking differently: Inhale 2 puffs into the lungs 2 (two) times daily.), Disp: 3 each, Rfl: 0   docusate sodium  (COLACE) 100 MG capsule, Take 1 capsule (100 mg total) by mouth 2 (two) times daily., Disp: , Rfl:    feeding supplement (ENSURE SURGERY) LIQD, Take 237 mLs by mouth 3 (three) times daily between meals., Disp: , Rfl:    ferrous gluconate  (FERGON) 324 MG tablet, Take 1 tablet (324 mg total) by mouth 2 (two) times daily with a meal., Disp: 180 tablet, Rfl: 0   furosemide  (LASIX ) 20 MG tablet, Take 2 tablets (40 mg total) by mouth daily as needed for fluid or edema., Disp: 180 tablet, Rfl: 1   montelukast  (SINGULAIR ) 10 MG tablet, TAKE 1 TABLET AT BEDTIME, Disp: 90 tablet, Rfl: 1   Nutritional Supplements (ENSURE ORIGINAL) LIQD, Take 237 mLs by mouth daily as needed (when the appetite is lacking)., Disp: , Rfl:    omeprazole  (PRILOSEC ) 20 MG capsule, TAKE 1 CAPSULE EVERY DAY 30 TO 60 MINUTES BEFORE FIRST MEAL OF THE DAY, Disp: 90 capsule, Rfl: 3  Review of Systems:  Negative unless indicated in HPI.   Physical Exam: Vitals:   04/09/24 1422  BP: 124/84  Pulse: 70  Temp: (!) 97.5 F (36.4 C)  TempSrc: Oral  SpO2: 98%  Weight: 231 lb 3.2 oz (104.9 kg)    Body mass index is 36.21 kg/m.   Physical Exam Vitals reviewed.  Constitutional:      Appearance: Normal appearance. She is obese.  HENT:     Head: Normocephalic and atraumatic.  Eyes:     Conjunctiva/sclera: Conjunctivae normal.  Cardiovascular:     Rate and Rhythm: Normal rate and regular rhythm.  Pulmonary:     Effort: Pulmonary effort is normal.     Breath sounds: Normal breath sounds.  Musculoskeletal:     Right lower leg: Edema present.     Left lower leg: Edema present.      Comments: 4+ right and lower extremity edema.  Skin:    General: Skin is warm and dry.  Neurological:     Mental Status: She is alert and oriented to person, place, and time.  Psychiatric:        Mood and Affect: Mood normal.        Behavior: Behavior normal.        Thought Content: Thought content normal.        Judgment: Judgment normal.      Impression and Plan:  Acute on chronic diastolic CHF (congestive heart failure) (HCC) -     Basic metabolic panel  with GFR; Future   - Have advised importance of Lasix .  She will increase to twice daily over the next week, will schedule follow-up for her in 1 week.  Check renal function today and again in 1 week with increased diuretic dosing.  Time spent:32 minutes reviewing chart, interviewing and examining patient and formulating plan of care.     Tully Theophilus Andrews, MD [facility]

## 2024-04-16 ENCOUNTER — Ambulatory Visit (INDEPENDENT_AMBULATORY_CARE_PROVIDER_SITE_OTHER): Admitting: Internal Medicine

## 2024-04-16 ENCOUNTER — Encounter: Payer: Self-pay | Admitting: Internal Medicine

## 2024-04-16 VITALS — BP 122/64 | HR 70 | Temp 97.6°F | Wt 214.8 lb

## 2024-04-16 DIAGNOSIS — I5032 Chronic diastolic (congestive) heart failure: Secondary | ICD-10-CM | POA: Diagnosis not present

## 2024-04-16 DIAGNOSIS — I5033 Acute on chronic diastolic (congestive) heart failure: Secondary | ICD-10-CM | POA: Diagnosis not present

## 2024-04-16 DIAGNOSIS — Z23 Encounter for immunization: Secondary | ICD-10-CM | POA: Diagnosis not present

## 2024-04-16 NOTE — Telephone Encounter (Signed)
 Copied from CRM #[id number]. Topic: Clinical - Prescription Issue >> Apr 16, 2024  4:57 PM Rea ORN wrote: Reason for CRM: Pt niece Dollie is on the line with pt. She stated they just left the office and pain medication was supposed to be sent to the pharmacy but she is at the pharmacy and they dont have it.  Pt & pt's niece called in to follow up on pt's rx: niece is at pharmacy now & no medications for pt.  Niece stated prior to walking out of the provider office today she was informed the provider had just called something in for pt: again niece stated nothing at pharmacy.  Pt did give permission for niece to speak on pt's behalf nice Shelly) requested a message be sent to provider to follow up on rx  Call [phone] shelly) - call this number and niece will 3way pt into call.

## 2024-04-16 NOTE — Progress Notes (Signed)
 Established Patient Office Visit     CC/Reason for Visit: Follow-up acute heart failure  HPI: [name] is a 83 y.o. female who is coming in today for the above mentioned reasons. Past Medical History is significant for: Heart failure with preserved ejection fraction.  She is being seen as a 1 week follow-up due to acute exacerbation of heart failure with significant weight gain and lower extremity edema.  She has been taking 40 mg of Lasix  twice a day in the past week.  She has lost 16 pounds.  Lower extremity edema has significantly improved.  She is walking easier and has no shortness of breath.   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   rxd 3 yrs meds, no surgery in kidneys    Past Surgical History:  Procedure Laterality Date   ABDOMINAL HYSTERECTOMY     BIOPSY  03/23/2023   Procedure: BIOPSY;  Surgeon: Charlanne Groom, MD;  Location: [facility];  Service: Gastroenterology;;   BREAST BIOPSY  04/30/2012   Procedure: BREAST BIOPSY WITH NEEDLE LOCALIZATION;  Surgeon: Donnice Bury, MD;  [facility];  Service: General;  Laterality: Left;  Left breast wire localization biospy   COLONOSCOPY WITH PROPOFOL  N/A 03/23/2023   Procedure: COLONOSCOPY WITH PROPOFOL ;  Surgeon: Charlanne Groom, MD;  [facility];  Service: Gastroenterology;  Laterality: N/A;   ESOPHAGOGASTRODUODENOSCOPY (EGD) WITH PROPOFOL  N/A 03/23/2023   Procedure: ESOPHAGOGASTRODUODENOSCOPY (EGD) WITH PROPOFOL ;  Surgeon: Charlanne Groom, MD;  Location[facility];   Service: Gastroenterology;  Laterality: N/A;   HEMORRHOID SURGERY     LAPAROTOMY N/A 03/26/2023   Procedure: EXPLORATORY LAPAROTOMY, RIGHT HEMICOLECTOMY; LYSIS OF ADHESIONS;  Surgeon: Belinda Donnice, MD;  Location: [facility];  Service: General;  Laterality: N/A;   PACEMAKER INSERTION  02-06-2011   PPM GENERATOR CHANGEOUT N/A 04/19/2020   Procedure: PPM GENERATOR CHANGEOUT;  Surgeon: Waddell Danelle ORN, MD;  [facility]: [facility];  Service: Cardiovascular;  Laterality: N/A;   SUBMUCOSAL TATTOO INJECTION  03/23/2023   Procedure: SUBMUCOSAL TATTOO INJECTION;  Surgeon: Charlanne Groom, MD;  Location: [facility];  Service: Gastroenterology;;   TAH and BSO     TONSILLECTOMY      Social History:  reports that she quit smoking about 46 years ago. Her smoking use included cigarettes. She started smoking about 50 years ago. She has a 4 pack-year smoking history. She has never used smokeless tobacco. She reports that she does not drink alcohol and does not use drugs.  Allergies: Allergies  Allergen Reactions   Albuterol  Palpitations and Other (See Comments)    Irregular heartbeat      Family History:  Family History  Problem Relation Age of Onset   Heart disease Mother    Breast cancer Mother    Breast cancer Sister    Prostate cancer Brother    Prostate cancer Son 74     Current Outpatient  Medications:    acetaminophen  (TYLENOL ) 325 MG tablet, Take 2 tablets (650 mg total) by mouth every 6 (six) hours as needed for mild pain (or Fever >/= 101)., Disp: , Rfl:    albuterol  (VENTOLIN  HFA) 108 (90 Base) MCG/ACT inhaler, Inhale 2 puffs into the lungs every 4 (four) hours as needed., Disp: 54 g, Rfl: 1   apixaban  (ELIQUIS ) 5 MG TABS tablet, TAKE 1 TABLET TWICE DAILY (NEED MD APPOINTMENT), Disp: 180 tablet, Rfl: 1   atorvastatin  (LIPITOR) 20 MG tablet, TAKE 1 TABLET EVERY DAY, Disp: 90 tablet, Rfl: 1   bisacodyl  (DULCOLAX) 5 MG EC tablet, Take 1 tablet (5 mg total) by mouth daily as needed for  moderate constipation., Disp: , Rfl:    budesonide -formoterol  (SYMBICORT ) 80-4.5 MCG/ACT inhaler, INHALE 2 PUFFS INTO THE LUNGS 2 (TWO) TIMES DAILY. (Patient taking differently: Inhale 2 puffs into the lungs 2 (two) times daily.), Disp: 3 each, Rfl: 0   docusate sodium  (COLACE) 100 MG capsule, Take 1 capsule (100 mg total) by mouth 2 (two) times daily., Disp: , Rfl:    feeding supplement (ENSURE SURGERY) LIQD, Take 237 mLs by mouth 3 (three) times daily between meals., Disp: , Rfl:    ferrous gluconate  (FERGON) 324 MG tablet, Take 1 tablet (324 mg total) by mouth 2 (two) times daily with a meal., Disp: 180 tablet, Rfl: 0   furosemide  (LASIX ) 20 MG tablet, Take 2 tablets (40 mg total) by mouth daily as needed for fluid or edema., Disp: 180 tablet, Rfl: 1   montelukast  (SINGULAIR ) 10 MG tablet, TAKE 1 TABLET AT BEDTIME, Disp: 90 tablet, Rfl: 1   Nutritional Supplements (ENSURE ORIGINAL) LIQD, Take 237 mLs by mouth daily as needed (when the appetite is lacking)., Disp: , Rfl:    omeprazole  (PRILOSEC ) 20 MG capsule, TAKE 1 CAPSULE EVERY DAY 30 TO 60 MINUTES BEFORE FIRST MEAL OF THE DAY, Disp: 90 capsule, Rfl: 3  Review of Systems:  Negative unless indicated in HPI.   Physical Exam: Vitals:   04/16/24 1455  BP: 122/64  Pulse: 70  Temp: 97.6 F (36.4 C)  TempSrc: Oral  SpO2: 100%  Weight: 214 lb 12.8 oz (97.4 kg)    Body mass index is 33.64 kg/m.   Physical Exam Vitals reviewed.  Constitutional:      Appearance: Normal appearance. She is obese.  HENT:     Head: Normocephalic and atraumatic.  Eyes:     Conjunctiva/sclera: Conjunctivae normal.  Cardiovascular:     Rate and Rhythm: Normal rate and regular rhythm.  Pulmonary:     Effort: Pulmonary effort is normal.     Breath sounds: Normal breath sounds.  Musculoskeletal:     Right lower leg: Edema present.     Left lower leg: Edema present.  Skin:    General: Skin is warm and dry.  Neurological:     General: No focal deficit  present.     Mental Status: She is alert and oriented to person, place, and time.  Psychiatric:        Mood and Affect: Mood normal.        Behavior: Behavior normal.        Thought Content: Thought content normal.        Judgment: Judgment normal.      Impression and Plan:  Chronic diastolic heart failure (HCC)   - Advised to decrease Lasix  to 40 mg daily instead of twice daily.  Will check renal function and electrolytes today.  Time  spent:30 minutes reviewing chart, interviewing and examining patient and formulating plan of care.     Tully Theophilus Andrews, MD [facility]

## 2024-04-16 NOTE — Addendum Note (Signed)
 Addended by: KATHRYNE MILLMAN B on: 04/16/2024 03:30 PM   Modules accepted: Orders

## 2024-04-17 LAB — BASIC METABOLIC PANEL WITH GFR
BUN: 14 mg/dL (ref 6–23)
CO2: 29 meq/L (ref 19–32)
Calcium: 8.8 mg/dL (ref 8.4–10.5)
Chloride: 103 meq/L (ref 96–112)
Creatinine, Ser: 1.06 mg/dL (ref 0.40–1.20)
GFR: 47.93 mL/min — ABNORMAL LOW (ref >60.00)
Glucose, Bld: 82 mg/dL (ref 70–99)
Potassium: 3.6 meq/L (ref 3.5–5.1)
Sodium: 141 meq/L (ref 135–145)

## 2024-04-20 ENCOUNTER — Ambulatory Visit: Payer: Self-pay | Admitting: Internal Medicine

## 2024-04-21 ENCOUNTER — Other Ambulatory Visit: Payer: Self-pay | Admitting: Internal Medicine

## 2024-04-21 ENCOUNTER — Ambulatory Visit: Payer: Self-pay

## 2024-04-21 DIAGNOSIS — M2550 Pain in unspecified joint: Secondary | ICD-10-CM

## 2024-04-21 MED ORDER — MELOXICAM 7.5 MG PO TABS: 7.5000 mg | ORAL_TABLET | Freq: Every day | ORAL | 0 refills | Status: AC

## 2024-04-21 NOTE — Telephone Encounter (Signed)
 FYI Only or Action Required?: Action required by provider: Patient was seen on the 10th and still has not had pain medications called in, has been calling daily.  Patient was last seen in primary care on 04/16/2024 by [name], [name], MD.  Called Nurse Triage reporting question.  Symptoms began today.  Interventions attempted: Nothing.  Symptoms are: gradually worsening.  Triage Disposition: Information or Advice Only Call  Patient/caregiver understands and will follow disposition?: No, wishes to speak with PCP   Copied from CRM #[id number]. Topic: Clinical - Red Word Triage >> Apr 21, 2024  2:22 PM [name] wrote: Red Word that prompted transfer to Nurse Triage: Patient having pain in shoulder and neck, can't lift arm, patient was in clinic 9-10 was told pain meds would be sent in and she still hasn't received any, and is still pain. Reason for Disposition  General information question, no triage required and triager able to answer question  Answer Assessment - Initial Assessment Questions 1. REASON FOR CALL: What is the main reason for your call? or How can I best help you?     Was told by PCP that pain medication would be called in and still has not received anything.  Protocols used: Information Only Call - No Triage-A-AH

## 2024-04-23 ENCOUNTER — Telehealth: Payer: Self-pay | Admitting: *Deleted

## 2024-04-23 ENCOUNTER — Telehealth: Payer: Self-pay

## 2024-04-23 DIAGNOSIS — C182 Malignant neoplasm of ascending colon: Secondary | ICD-10-CM

## 2024-04-23 DIAGNOSIS — I5032 Chronic diastolic (congestive) heart failure: Secondary | ICD-10-CM

## 2024-04-23 DIAGNOSIS — I5031 Acute diastolic (congestive) heart failure: Secondary | ICD-10-CM

## 2024-04-23 DIAGNOSIS — R600 Localized edema: Secondary | ICD-10-CM

## 2024-04-23 DIAGNOSIS — I442 Atrioventricular block, complete: Secondary | ICD-10-CM

## 2024-04-23 DIAGNOSIS — I480 Paroxysmal atrial fibrillation: Secondary | ICD-10-CM

## 2024-04-23 NOTE — Telephone Encounter (Signed)
 Spoke to the son and an appointment scheduled

## 2024-04-23 NOTE — Addendum Note (Signed)
 Addended by: KATHRYNE MILLMAN B on: 04/23/2024 04:38 PM   Modules accepted: Orders

## 2024-04-23 NOTE — Telephone Encounter (Signed)
 Copied from CRM [id number]. Topic: Clinical - Medical Advice >> Apr 23, 2024  2:04 PM Maisie C wrote: Reason for CRM: Odella from [facility] called to request for an order for Hospice Care. Order can be faxed to [phone]. She mentioned for the doctor to also Specify if they would like to remain the attending or have the hospice physician manage the patient.   Contact # is [phone].

## 2024-04-23 NOTE — Telephone Encounter (Signed)
 Referral placed.
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
## 2024-04-24 ENCOUNTER — Encounter: Payer: Self-pay | Admitting: Internal Medicine

## 2024-04-24 ENCOUNTER — Ambulatory Visit: Admitting: Internal Medicine

## 2024-04-28 ENCOUNTER — Ambulatory Visit: Admitting: Internal Medicine

## 2024-05-04 DIAGNOSIS — E785 Hyperlipidemia, unspecified: Secondary | ICD-10-CM | POA: Diagnosis not present

## 2024-05-04 DIAGNOSIS — K219 Gastro-esophageal reflux disease without esophagitis: Secondary | ICD-10-CM | POA: Diagnosis not present

## 2024-05-04 DIAGNOSIS — I25119 Atherosclerotic heart disease of native coronary artery with unspecified angina pectoris: Secondary | ICD-10-CM | POA: Diagnosis not present

## 2024-05-04 DIAGNOSIS — J479 Bronchiectasis, uncomplicated: Secondary | ICD-10-CM | POA: Diagnosis not present

## 2024-05-04 DIAGNOSIS — Z9989 Dependence on other enabling machines and devices: Secondary | ICD-10-CM | POA: Diagnosis not present

## 2024-05-04 DIAGNOSIS — Z9181 History of falling: Secondary | ICD-10-CM | POA: Diagnosis not present

## 2024-05-04 DIAGNOSIS — M1990 Unspecified osteoarthritis, unspecified site: Secondary | ICD-10-CM | POA: Diagnosis not present

## 2024-05-04 DIAGNOSIS — F419 Anxiety disorder, unspecified: Secondary | ICD-10-CM | POA: Diagnosis not present

## 2024-05-04 DIAGNOSIS — L309 Dermatitis, unspecified: Secondary | ICD-10-CM | POA: Diagnosis not present

## 2024-05-04 DIAGNOSIS — Z85038 Personal history of other malignant neoplasm of large intestine: Secondary | ICD-10-CM | POA: Diagnosis not present

## 2024-05-04 DIAGNOSIS — Z87440 Personal history of urinary (tract) infections: Secondary | ICD-10-CM | POA: Diagnosis not present

## 2024-05-04 DIAGNOSIS — I739 Peripheral vascular disease, unspecified: Secondary | ICD-10-CM | POA: Diagnosis not present

## 2024-05-04 DIAGNOSIS — Z8701 Personal history of pneumonia (recurrent): Secondary | ICD-10-CM | POA: Diagnosis not present

## 2024-05-04 DIAGNOSIS — N1831 Chronic kidney disease, stage 3a: Secondary | ICD-10-CM | POA: Diagnosis not present

## 2024-05-04 DIAGNOSIS — I509 Heart failure, unspecified: Secondary | ICD-10-CM | POA: Diagnosis not present

## 2024-05-04 DIAGNOSIS — I252 Old myocardial infarction: Secondary | ICD-10-CM | POA: Diagnosis not present

## 2024-05-04 DIAGNOSIS — E669 Obesity, unspecified: Secondary | ICD-10-CM | POA: Diagnosis not present

## 2024-05-04 DIAGNOSIS — K5900 Constipation, unspecified: Secondary | ICD-10-CM | POA: Diagnosis not present

## 2024-05-04 DIAGNOSIS — R011 Cardiac murmur, unspecified: Secondary | ICD-10-CM | POA: Diagnosis not present

## 2024-05-04 DIAGNOSIS — I82509 Chronic embolism and thrombosis of unspecified deep veins of unspecified lower extremity: Secondary | ICD-10-CM | POA: Diagnosis not present

## 2024-05-04 DIAGNOSIS — I495 Sick sinus syndrome: Secondary | ICD-10-CM | POA: Diagnosis not present

## 2024-05-04 DIAGNOSIS — I872 Venous insufficiency (chronic) (peripheral): Secondary | ICD-10-CM | POA: Diagnosis not present

## 2024-05-04 DIAGNOSIS — I130 Hypertensive heart and chronic kidney disease with heart failure and stage 1 through stage 4 chronic kidney disease, or unspecified chronic kidney disease: Secondary | ICD-10-CM | POA: Diagnosis not present

## 2024-05-04 DIAGNOSIS — J439 Emphysema, unspecified: Secondary | ICD-10-CM | POA: Diagnosis not present

## 2024-05-04 DIAGNOSIS — Z95810 Presence of automatic (implantable) cardiac defibrillator: Secondary | ICD-10-CM | POA: Diagnosis not present

## 2024-05-04 DIAGNOSIS — G629 Polyneuropathy, unspecified: Secondary | ICD-10-CM | POA: Diagnosis not present

## 2024-05-04 DIAGNOSIS — K08109 Complete loss of teeth, unspecified cause, unspecified class: Secondary | ICD-10-CM | POA: Diagnosis not present

## 2024-05-04 DIAGNOSIS — R32 Unspecified urinary incontinence: Secondary | ICD-10-CM | POA: Diagnosis not present

## 2024-05-04 DIAGNOSIS — R2681 Unsteadiness on feet: Secondary | ICD-10-CM | POA: Diagnosis not present

## 2024-05-04 DIAGNOSIS — I4891 Unspecified atrial fibrillation: Secondary | ICD-10-CM | POA: Diagnosis not present

## 2024-05-04 DIAGNOSIS — M4800 Spinal stenosis, site unspecified: Secondary | ICD-10-CM | POA: Diagnosis not present

## 2024-05-04 DIAGNOSIS — Z86711 Personal history of pulmonary embolism: Secondary | ICD-10-CM | POA: Diagnosis not present

## 2024-05-04 DIAGNOSIS — K561 Intussusception: Secondary | ICD-10-CM | POA: Diagnosis not present

## 2024-05-04 DIAGNOSIS — J301 Allergic rhinitis due to pollen: Secondary | ICD-10-CM | POA: Diagnosis not present

## 2024-05-04 DIAGNOSIS — I442 Atrioventricular block, complete: Secondary | ICD-10-CM | POA: Diagnosis not present

## 2024-05-06 ENCOUNTER — Telehealth: Payer: Self-pay | Admitting: Internal Medicine

## 2024-05-06 NOTE — Telephone Encounter (Signed)
 Copied from CRM [id number]. Topic: General - Other >> May 06, 2024 10:41 AM Dedra B wrote: Reason for CRM: Pt son, Justyn Boyson, calling in regards to home health paperwork he dropped off in April.He needs to get a hard copy of the paperwork for the people that are coming out to pt's home. He will have a relative pick it up. Pls call Shyonna Carlin at [phone] if any questions.

## 2024-05-07 NOTE — Telephone Encounter (Signed)
 Patient stopped by the office and signed a ROI form.

## 2024-05-08 ENCOUNTER — Telehealth: Payer: Self-pay | Admitting: *Deleted

## 2024-05-08 ENCOUNTER — Encounter

## 2024-05-08 ENCOUNTER — Other Ambulatory Visit: Payer: Self-pay | Admitting: Internal Medicine

## 2024-05-08 DIAGNOSIS — I2699 Other pulmonary embolism without acute cor pulmonale: Secondary | ICD-10-CM

## 2024-05-08 DIAGNOSIS — I5032 Chronic diastolic (congestive) heart failure: Secondary | ICD-10-CM

## 2024-05-08 DIAGNOSIS — J4530 Mild persistent asthma, uncomplicated: Secondary | ICD-10-CM

## 2024-05-08 NOTE — Telephone Encounter (Signed)
 Left message on machine for niece that the form has been completed, faxed, and confirmed.  She can pick up a copy from [place] if she would like.

## 2024-05-08 NOTE — Telephone Encounter (Signed)
 Copied from CRM [id number]. Topic: General - Other >> May 08, 2024  3:04 PM Macario HERO wrote: Reason for CRM: Patient niece is called requesting a call back from providers nurse. Call back: [phone].

## 2024-06-06 ENCOUNTER — Other Ambulatory Visit: Payer: Self-pay | Admitting: Internal Medicine

## 2024-06-06 ENCOUNTER — Telehealth: Payer: Self-pay | Admitting: Internal Medicine

## 2024-06-06 DIAGNOSIS — I5032 Chronic diastolic (congestive) heart failure: Secondary | ICD-10-CM

## 2024-06-06 NOTE — Telephone Encounter (Signed)
 Copied from CRM #[id number]. Topic: Medical Record Request - Other >> Jun 06, 2024  9:09 AM Robinson H wrote: Reason for CRM: Patients Goddaughter Avelina calling to see if office completed and sent out paperwork for patient to start receiving home care assistance with Medicaid, someone was supposed to come out and do an assessment and haven't heard anything.  Avelina [phone]

## 2024-06-06 NOTE — Telephone Encounter (Signed)
 Copied from CRM #[id number]. Topic: Clinical - Medication Refill >> Jun 06, 2024  9:06 AM Robinson H wrote: Medication: furosemide  (LASIX ) 20 MG tablet  Has the patient contacted their pharmacy? No, prescription expired (Agent: If no, request that the patient contact the pharmacy for the refill. If patient does not wish to contact the pharmacy document the reason why and proceed with request.) (Agent: If yes, when and what did the pharmacy advise?)  This is the patient's preferred pharmacy:   [facility] - [place], [place] - [address] [place] [address] Phone: [phone] Fax: [phone]  Is this the correct pharmacy for this prescription? Yes If no, delete pharmacy and type the correct one.   Has the prescription been filled recently? No  Is the patient out of the medication? No  Has the patient been seen for an appointment in the last year OR does the patient have an upcoming appointment? Yes  Can we respond through MyChart? No  Agent: Please be advised that Rx refills may take up to 3 business days. We ask that you follow-up with your pharmacy.

## 2024-06-09 ENCOUNTER — Ambulatory Visit: Payer: Self-pay

## 2024-06-09 MED ORDER — FUROSEMIDE 20 MG PO TABS: 40.0000 mg | ORAL_TABLET | Freq: Every day | ORAL | 1 refills | Status: AC | PRN

## 2024-06-09 NOTE — Telephone Encounter (Signed)
 FYI Only or Action Required?: FYI only for provider: appointment scheduled on 11/5.  Patient was last seen in primary care on 04/16/2024 by [name], [name], MD.  Called Nurse Triage reporting Shoulder Pain.  Symptoms began a week ago.  Interventions attempted: Prescription medications: MELOXICAM .  Symptoms are: gradually worsening.  Triage Disposition: See PCP When Office is Open (Within 3 Days)  Patient/caregiver understands and will follow disposition?: Copied from CRM #[id number]. Topic: Clinical - Red Word Triage >> Jun 09, 2024  4:56 PM Corin V wrote: [facility] that prompted transfer to Nurse Triage: Patient is having right shoulder pain. Rating is currently 7-8/10. Hurts worst when laying down or moving it. Reason for Disposition  [1] MODERATE pain (e.g., interferes with normal activities) AND [2] present > 3 days  Answer Assessment - Initial Assessment Questions For the last 2 weeks off and on patient has been having R shoulder pain. Hurts to raise her arm to comb her hair Meloxicam - with little benefit- takes the edge off  Daughter unsure if they can make it in tomorrow. Will call back in AM. oTherwise scheduled for 11/5. Patient advised UC/ED precautions and understood.  1. ONSET: When did the pain start?     About 2 weeks 2. LOCATION: Where is the pain located?     Feels like the muscle versus the joint in the right arm  3. PAIN: How bad is the pain? (Scale 1-10; or mild, moderate, severe)     7-8/10 4. WORK OR EXERCISE: Has there been any recent work or exercise that involved this part of the body?     No overuse.  5. CAUSE: What do you think is causing the shoulder pain?     Might have lay down on it wrong  6. OTHER SYMPTOMS: Do you have any other symptoms? (e.g., neck pain, swelling, rash, fever, numbness, weakness)     Going up into the neck- pain, denies paresthesias.  Protocols used: Shoulder Pain-A-AH

## 2024-06-09 NOTE — Telephone Encounter (Signed)
 Follow up with [name], inform her of note from 05/08/2024 stating that form was completed and faxed successfully. Pt can pick up a copy at front desk from Ms. Norma. [name] verbalized understanding.

## 2024-06-10 NOTE — Telephone Encounter (Signed)
 Noted

## 2024-06-11 ENCOUNTER — Ambulatory Visit: Admitting: Internal Medicine

## 2024-06-12 ENCOUNTER — Telehealth: Payer: Self-pay | Admitting: Internal Medicine

## 2024-06-12 NOTE — Telephone Encounter (Signed)
 Patient has no showed/late canceled 3 appointments since 08/27/23 which qualifies for dismissal. Please advise, thanks

## 2024-06-17 ENCOUNTER — Telehealth: Payer: Self-pay

## 2024-06-17 ENCOUNTER — Ambulatory Visit: Admitting: Internal Medicine

## 2024-06-17 NOTE — Telephone Encounter (Signed)
 Appointment cancelled as requested.   Copied from CRM [id number]. Topic: Appointments - Scheduling Inquiry for Clinic >> Jun 17, 2024 11:29 AM [name] wrote: Reason for CRM: Patient is scheduled for a 1:30 Appt today with Dr. Theophilus. This appt was scheduled by NT for shoulder pain. Caller is granddaughter Rickea (emergency contact) who states the home health aide scheduled to come today but they were not made aware until recently.  Caller just wanted to inform us  and disconnected, stating she would call back to reschedule. Is cancellation appropriate?  Her callback is [phone]

## 2024-06-18 ENCOUNTER — Encounter: Payer: Self-pay | Admitting: Internal Medicine

## 2024-06-19 ENCOUNTER — Telehealth: Payer: Self-pay
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
## 2024-06-20 NOTE — Telephone Encounter (Signed)
 Call x1; attempted to call patient - vm full, spoke w/ patients son Meribeth to schedule overdue appt (due 05/2024) for 1 yr f/u - Meribeth requested I call patients Niece Barbar as he is in [place], lvmtcb w/ Niece Rickea.

## 2024-07-09 NOTE — Telephone Encounter (Signed)
 Call x2; attempted to call patient - vm full, call not completed when attempted to call niece Rickea - to schedule overdue appt (due 05/2024) for 1 yr f/u.

## 2024-07-21 NOTE — Telephone Encounter (Signed)
 Spoke w/ patients niece Barbar - patient is scheduled to see Daphne Barrack, NP on 1/15!

## 2024-08-07 ENCOUNTER — Encounter

## 2024-08-08 ENCOUNTER — Ambulatory Visit

## 2024-08-21 ENCOUNTER — Ambulatory Visit: Admitting: Pulmonary Disease

## 2024-09-25 ENCOUNTER — Ambulatory Visit: Admitting: Pulmonary Disease

## 2024-11-06 ENCOUNTER — Encounter

## 2024-11-07 ENCOUNTER — Ambulatory Visit

## 2025-02-05 ENCOUNTER — Encounter

## 2025-02-06 ENCOUNTER — Ambulatory Visit

## 2025-05-07 ENCOUNTER — Encounter

## 2025-05-08 ENCOUNTER — Ambulatory Visit

## 2025-08-10 ENCOUNTER — Ambulatory Visit

## 2025-11-06 ENCOUNTER — Ambulatory Visit
